# Patient Record
Sex: Female | Born: 1970
Health system: Southern US, Community
[De-identification: ages and names within clinical notes are randomized; demographics above are authoritative.]

## PROBLEM LIST (undated history)

## (undated) DIAGNOSIS — F32A Depression, unspecified: Secondary | ICD-10-CM

## (undated) DIAGNOSIS — G4730 Sleep apnea, unspecified: Secondary | ICD-10-CM

## (undated) DIAGNOSIS — D649 Anemia, unspecified: Secondary | ICD-10-CM

## (undated) DIAGNOSIS — N200 Calculus of kidney: Secondary | ICD-10-CM

## (undated) DIAGNOSIS — F329 Major depressive disorder, single episode, unspecified: Secondary | ICD-10-CM

## (undated) DIAGNOSIS — G5601 Carpal tunnel syndrome, right upper limb: Secondary | ICD-10-CM

## (undated) DIAGNOSIS — Z5189 Encounter for other specified aftercare: Secondary | ICD-10-CM

## (undated) DIAGNOSIS — E669 Obesity, unspecified: Secondary | ICD-10-CM

## (undated) DIAGNOSIS — F319 Bipolar disorder, unspecified: Secondary | ICD-10-CM

## (undated) DIAGNOSIS — J209 Acute bronchitis, unspecified: Secondary | ICD-10-CM

## (undated) DIAGNOSIS — K56609 Unspecified intestinal obstruction, unspecified as to partial versus complete obstruction: Secondary | ICD-10-CM

## (undated) DIAGNOSIS — F419 Anxiety disorder, unspecified: Secondary | ICD-10-CM

## (undated) DIAGNOSIS — E119 Type 2 diabetes mellitus without complications: Secondary | ICD-10-CM

## (undated) HISTORY — PX: ENDOMETRIAL ABLATION: SHX621

## (undated) HISTORY — DX: Obesity, unspecified: E66.9

## (undated) HISTORY — DX: Anemia, unspecified: D64.9

## (undated) HISTORY — DX: Type 2 diabetes mellitus without complications: E11.9

## (undated) HISTORY — DX: Acute bronchitis, unspecified: J20.9

## (undated) HISTORY — DX: Encounter for other specified aftercare: Z51.89

## (undated) HISTORY — DX: Major depressive disorder, single episode, unspecified: F32.9

## (undated) HISTORY — DX: Depression, unspecified: F32.A

---
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
## 1999-05-16 HISTORY — PX: GASTRIC BYPASS: SHX52

## 2013-05-15 HISTORY — PX: OTHER SURGICAL HISTORY: SHX169

## 2014-01-13 DIAGNOSIS — K56609 Unspecified intestinal obstruction, unspecified as to partial versus complete obstruction: Secondary | ICD-10-CM

## 2014-01-13 HISTORY — DX: Unspecified intestinal obstruction, unspecified as to partial versus complete obstruction: K56.609

## 2014-12-31 ENCOUNTER — Encounter (HOSPITAL_BASED_OUTPATIENT_CLINIC_OR_DEPARTMENT_OTHER): Payer: Self-pay | Admitting: Emergency Medicine

## 2014-12-31 ENCOUNTER — Emergency Department (HOSPITAL_BASED_OUTPATIENT_CLINIC_OR_DEPARTMENT_OTHER)
Admission: EM | Admit: 2014-12-31 | Discharge: 2014-12-31 | Disposition: A | Discharge status: Home / Self Care | Payer: Medicaid - Out of State | Attending: Emergency Medicine | Admitting: Emergency Medicine

## 2014-12-31 ENCOUNTER — Emergency Department (HOSPITAL_BASED_OUTPATIENT_CLINIC_OR_DEPARTMENT_OTHER): Payer: Medicaid - Out of State

## 2014-12-31 DIAGNOSIS — Z720 Tobacco use: Secondary | ICD-10-CM | POA: Insufficient documentation

## 2014-12-31 DIAGNOSIS — Z79899 Other long term (current) drug therapy: Secondary | ICD-10-CM | POA: Diagnosis not present

## 2014-12-31 DIAGNOSIS — J9801 Acute bronchospasm: Secondary | ICD-10-CM | POA: Insufficient documentation

## 2014-12-31 DIAGNOSIS — R0602 Shortness of breath: Secondary | ICD-10-CM | POA: Diagnosis present

## 2014-12-31 HISTORY — DX: Calculus of kidney: N20.0

## 2014-12-31 HISTORY — DX: Anxiety disorder, unspecified: F41.9

## 2014-12-31 HISTORY — DX: Bipolar disorder, unspecified: F31.9

## 2014-12-31 MED ORDER — ALBUTEROL (5 MG/ML) CONTINUOUS INHALATION SOLN
10.0000 mg/h | INHALATION_SOLUTION | Freq: Once | RESPIRATORY_TRACT | Status: AC
  Administered 2014-12-31: 10 mg/h via RESPIRATORY_TRACT
  Filled 2014-12-31: qty 20

## 2014-12-31 MED ORDER — ALBUTEROL SULFATE (2.5 MG/3ML) 0.083% IN NEBU
2.5000 mg | INHALATION_SOLUTION | Freq: Once | RESPIRATORY_TRACT | Status: AC
  Administered 2014-12-31: 2.5 mg via RESPIRATORY_TRACT
  Filled 2014-12-31: qty 3

## 2014-12-31 MED ORDER — PREDNISONE 20 MG PO TABS: 20.0000 mg | ORAL_TABLET | Freq: Two times a day (BID) | ORAL | Status: DC

## 2014-12-31 MED ORDER — BENZONATATE 100 MG PO CAPS: 100.0000 mg | ORAL_CAPSULE | Freq: Three times a day (TID) | ORAL | Status: DC

## 2014-12-31 MED ORDER — IPRATROPIUM-ALBUTEROL 0.5-2.5 (3) MG/3ML IN SOLN
3.0000 mL | Freq: Once | RESPIRATORY_TRACT | Status: AC
  Administered 2014-12-31: 3 mL via RESPIRATORY_TRACT
  Filled 2014-12-31: qty 3

## 2014-12-31 MED ORDER — PREDNISONE 50 MG PO TABS
60.0000 mg | ORAL_TABLET | Freq: Once | ORAL | Status: AC
  Administered 2014-12-31: 60 mg via ORAL
  Filled 2014-12-31 (×2): qty 1

## 2014-12-31 MED ORDER — BENZONATATE 100 MG PO CAPS
200.0000 mg | ORAL_CAPSULE | Freq: Once | ORAL | Status: AC
  Administered 2014-12-31: 200 mg via ORAL
  Filled 2014-12-31: qty 2

## 2014-12-31 MED ORDER — HYDROCOD POLST-CPM POLST ER 10-8 MG/5ML PO SUER: 5.0000 mL | Freq: Two times a day (BID) | ORAL | Status: DC | PRN

## 2014-12-31 MED ORDER — ALBUTEROL SULFATE HFA 108 (90 BASE) MCG/ACT IN AERS: 1.0000 | INHALATION_SPRAY | Freq: Four times a day (QID) | RESPIRATORY_TRACT | Status: DC | PRN

## 2014-12-31 NOTE — ED Notes (Signed)
Patient is resting comfortably. 

## 2014-12-31 NOTE — ED Provider Notes (Signed)
CSN: [id number]     Arrival date & time 12/31/14  4098 History   First MD Initiated Contact with Patient 12/31/14 0830     Chief Complaint  Patient presents with  . Shortness of Breath      HPI  Patient presents for evaluation after difficult breathing. Felt like she had a "allergy" yesterday. States her eyes were watering her nose was "pouring snot". She felt better last night after some Benadryl. This morning she felt short of breath and tight and wheezing. She is very distant history of bronchospasm is not using inhalers for many years. No fevers no chills. No chest pain. No lower extremity edema.  Past Medical History  Diagnosis Date  . Kidney stone   . Anxiety   . Bipolar affective disorder    Past Surgical History  Procedure Laterality Date  . Cesarean section    . Gastric bypass    . Uterine ablasion     No family history on file. Social History  Substance Use Topics  . Smoking status: Current Some Day Smoker  . Smokeless tobacco: None  . Alcohol Use: None   OB History    No data available     Review of Systems  Constitutional: Negative for fever, chills, diaphoresis, appetite change and fatigue.  HENT: Positive for rhinorrhea. Negative for mouth sores, sore throat and trouble swallowing.   Eyes: Negative for visual disturbance.  Respiratory: Positive for cough and wheezing. Negative for chest tightness and shortness of breath.   Cardiovascular: Negative for chest pain.  Gastrointestinal: Negative for nausea, vomiting, abdominal pain, diarrhea and abdominal distention.  Endocrine: Negative for polydipsia, polyphagia and polyuria.  Genitourinary: Negative for dysuria, frequency and hematuria.  Musculoskeletal: Negative for gait problem.  Skin: Negative for color change, pallor and rash.  Neurological: Negative for dizziness, syncope, light-headedness and headaches.  Hematological: Does not bruise/bleed easily.  Psychiatric/Behavioral: Negative for behavioral  problems and confusion.      Allergies  Nsaids  Home Medications   Prior to Admission medications   Medication Sig Start Date End Date Taking? Authorizing Provider  amitriptyline (ELAVIL) 25 MG tablet Take 25 mg by mouth 3 (three) times daily.   Yes Historical Provider, MD  diazepam (VALIUM) 10 MG tablet Take 10 mg by mouth 3 (three) times daily.   Yes Historical Provider, MD  DOXAZOSIN MESYLATE ER PO Take 1 mg by mouth daily.   Yes Historical Provider, MD  FLUoxetine (PROZAC) 20 MG tablet Take 60 mg by mouth daily.   Yes Historical Provider, MD  Oxcarbazepine (TRILEPTAL) 300 MG tablet Take 300 mg by mouth 3 (three) times daily.   Yes Historical Provider, MD  QUEtiapine (SEROQUEL) 200 MG tablet Take 200 mg by mouth at bedtime.   Yes Historical Provider, MD  trifluoperazine (STELAZINE) 2 MG tablet Take 2 mg by mouth 2 (two) times daily.   Yes Historical Provider, MD  valACYclovir (VALTREX) 1000 MG tablet Take 1,000 mg by mouth daily.   Yes Historical Provider, MD  vorinostat (ZOLINZA) 100 MG capsule Take 400 mg by mouth daily. Take with food.   Yes Historical Provider, MD  zolpidem (AMBIEN) 10 MG tablet Take 10 mg by mouth at bedtime as needed for sleep.   Yes Historical Provider, MD  albuterol (PROVENTIL HFA;VENTOLIN HFA) 108 (90 BASE) MCG/ACT inhaler Inhale 1-2 puffs into the lungs every 6 (six) hours as needed for wheezing. 12/31/14   Rolland Porter, MD  benzonatate (TESSALON) 100 MG capsule Take 1 capsule (100  mg total) by mouth every 8 (eight) hours. 12/31/14   Rolland Porter, MD  chlorpheniramine-HYDROcodone [facility] ER) 10-8 MG/5ML SUER Take 5 mLs by mouth every 12 (twelve) hours as needed for cough. 12/31/14   Rolland Porter, MD  predniSONE (DELTASONE) 20 MG tablet Take 1 tablet (20 mg total) by mouth 2 (two) times daily with a meal. 12/31/14   Rolland Porter, MD   BP 120/70 mmHg  Pulse 98  Temp(Src) 98.3 F (36.8 C)  Resp 16  Ht 5\' 4"  (1.626 m)  Wt 200 lb (90.719 kg)  BMI 34.31  kg/m2  SpO2 95% Physical Exam  Constitutional: She is oriented to person, place, and time. She appears well-developed and well-nourished. No distress.  HENT:  Head: Normocephalic.  Eyes: Conjunctivae are normal. Pupils are equal, round, and reactive to light. No scleral icterus.  Neck: Normal range of motion. Neck supple. No thyromegaly present.  Cardiovascular: Normal rate and regular rhythm.  Exam reveals no gallop and no friction rub.   No murmur heard. Pulmonary/Chest: Effort normal. No respiratory distress. She has wheezes. She has no rales.  Wheezing and prolongation in all fields. Overall good air exchange. No increased work of breathing. Does not appear dyspneic or distressed.  Abdominal: Soft. Bowel sounds are normal. She exhibits no distension. There is no tenderness. There is no rebound.  Musculoskeletal: Normal range of motion.  Neurological: She is alert and oriented to person, place, and time.  Skin: Skin is warm and dry. No rash noted.  Psychiatric: She has a normal mood and affect. Her behavior is normal.    ED Course  Procedures (including critical care time) Labs Review Labs Reviewed - No data to display  Imaging Review Dg Chest 2 View  12/31/2014   CLINICAL DATA:  Shortness of breath today. Sinus symptoms 12/30/2014. Initial encounter.  EXAM: CHEST  2 VIEW  COMPARISON:  None.  FINDINGS: Heart size and mediastinal contours are within normal limits. Both lungs are clear. Visualized skeletal structures are unremarkable.  IMPRESSION: Negative exam.   Electronically Signed   By: Drusilla Kanner M.D.   On: 12/31/2014 09:21   I have personally reviewed and evaluated these images and lab results as part of my medical decision-making.   EKG Interpretation None      MDM   Final diagnoses:  Bronchospasm    Given by mouth prednisone. Given nebulized albuterol. Minimal improvement. Given 1 hour continuous nebulized albuterol. Has much improvement. He is sleeping. Well  oxygenated at 96%. X-ray without acute findings. No fever. Not hypoxemic. Plan is discharge home. Zyrtec, prednisone, albuterol MDI. Return worsening revolving symptoms.  CRITICAL CARE Performed by: Rolland Porter JOSEPH   Total critical care time: 60 minutes of continuous nebulized albuterol  Critical care time was exclusive of separately billable procedures and treating other patients.  Critical care was necessary to treat or prevent imminent or life-threatening deterioration.  Critical care was time spent personally by me on the following activities: development of treatment plan with patient and/or surrogate as well as nursing, discussions with consultants, evaluation of patient's response to treatment, examination of patient, obtaining history from patient or surrogate, ordering and performing treatments and interventions, ordering and review of laboratory studies, ordering and review of radiographic studies, pulse oximetry and re-evaluation of patient's condition. care    Rolland Porter, MD 12/31/14 1256

## 2014-12-31 NOTE — ED Notes (Signed)
Some allergy symptoms yesterday which has progressed to sob.  Some coughing.  Non productive.

## 2014-12-31 NOTE — Discharge Instructions (Signed)
Bronchospasm °A bronchospasm is a spasm or tightening of the airways going into the lungs. During a bronchospasm breathing becomes more difficult because the airways get smaller. When this happens there can be coughing, a whistling sound when breathing (wheezing), and difficulty breathing. Bronchospasm is often associated with asthma, but not all patients who experience a bronchospasm have asthma. °CAUSES  °A bronchospasm is caused by inflammation or irritation of the airways. The inflammation or irritation may be triggered by:  °· Allergies (such as to animals, pollen, food, or mold). Allergens that cause bronchospasm may cause wheezing immediately after exposure or many hours later.   °· Infection. Viral infections are believed to be the most common cause of bronchospasm.   °· Exercise.   °· Irritants (such as pollution, cigarette smoke, strong odors, aerosol sprays, and paint fumes).   °· Weather changes. Winds increase molds and pollens in the air. Rain refreshes the air by washing irritants out. Cold air may cause inflammation.   °· Stress and emotional upset.   °SIGNS AND SYMPTOMS  °· Wheezing.   °· Excessive nighttime coughing.   °· Frequent or severe coughing with a simple cold.   °· Chest tightness.   °· Shortness of breath.   °DIAGNOSIS  °Bronchospasm is usually diagnosed through a history and physical exam. Tests, such as chest X-rays, are sometimes done to look for other conditions. °TREATMENT  °· Inhaled medicines can be given to open up your airways and help you breathe. The medicines can be given using either an inhaler or a nebulizer machine. °· Corticosteroid medicines may be given for severe bronchospasm, usually when it is associated with asthma. °HOME CARE INSTRUCTIONS  °· Always have a plan prepared for seeking medical care. Know when to call your health care provider and local emergency services (911 in the [place] Know where you can access local emergency care. °· Only take medicines as  directed by your health care provider. °· If you were prescribed an inhaler or nebulizer machine, ask your health care provider to explain how to use it correctly. Always use a spacer with your inhaler if you were given one. °· It is necessary to remain calm during an attack. Try to relax and breathe more slowly.  °· Control your home environment in the following ways:   °¨ Change your heating and air conditioning filter at least once a month.   °¨ Limit your use of fireplaces and wood stoves. °¨ Do not smoke and do not allow smoking in your home.   °¨ Avoid exposure to perfumes and fragrances.   °¨ Get rid of pests (such as roaches and mice) and their droppings.   °¨ Throw away plants if you see mold on them.   °¨ Keep your house clean and dust free.   °¨ Replace carpet with wood, tile, or vinyl flooring. Carpet can trap dander and dust.   °¨ Use allergy-proof pillows, mattress covers, and box spring covers.   °¨ Wash bed sheets and blankets every week in hot water and dry them in a dryer.   °¨ Use blankets that are made of polyester or cotton.   °¨ Wash hands frequently. °SEEK MEDICAL CARE IF:  °· You have muscle aches.   °· You have chest pain.   °· The sputum changes from clear or white to yellow, green, gray, or bloody.   °· The sputum you cough up gets thicker.   °· There are problems that may be related to the medicine you are given, such as a rash, itching, swelling, or trouble breathing.   °SEEK IMMEDIATE MEDICAL CARE IF:  °· You have worsening wheezing and coughing even   after taking your prescribed medicines.   °· You have increased difficulty breathing.   °· You develop severe chest pain. °MAKE SURE YOU:  °· Understand these instructions. °· Will watch your condition. °· Will get help right away if you are not doing well or get worse. °Document Released: 05/04/2003 Document Revised: 05/06/2013 Document Reviewed: 10/21/2012 °[employer]® Patient Information ©2015 [employer]. This information is not  intended to replace advice given to you by your health care provider. Make sure you discuss any questions you have with your health care provider. ° °

## 2015-01-01 ENCOUNTER — Inpatient Hospital Stay (HOSPITAL_BASED_OUTPATIENT_CLINIC_OR_DEPARTMENT_OTHER)
Admission: EM | Admit: 2015-01-01 | Discharge: 2015-01-05 | DRG: 202 | Disposition: A | Discharge status: Home / Self Care | Payer: Medicaid - Out of State | Attending: Internal Medicine | Admitting: Internal Medicine

## 2015-01-01 ENCOUNTER — Emergency Department (HOSPITAL_BASED_OUTPATIENT_CLINIC_OR_DEPARTMENT_OTHER): Payer: Medicaid - Out of State

## 2015-01-01 ENCOUNTER — Encounter (HOSPITAL_BASED_OUTPATIENT_CLINIC_OR_DEPARTMENT_OTHER): Payer: Self-pay

## 2015-01-01 DIAGNOSIS — I10 Essential (primary) hypertension: Secondary | ICD-10-CM | POA: Diagnosis present

## 2015-01-01 DIAGNOSIS — J209 Acute bronchitis, unspecified: Secondary | ICD-10-CM | POA: Diagnosis not present

## 2015-01-01 DIAGNOSIS — F411 Generalized anxiety disorder: Secondary | ICD-10-CM | POA: Diagnosis present

## 2015-01-01 DIAGNOSIS — F319 Bipolar disorder, unspecified: Secondary | ICD-10-CM | POA: Diagnosis not present

## 2015-01-01 DIAGNOSIS — Z9884 Bariatric surgery status: Secondary | ICD-10-CM

## 2015-01-01 DIAGNOSIS — Z79899 Other long term (current) drug therapy: Secondary | ICD-10-CM

## 2015-01-01 DIAGNOSIS — J45909 Unspecified asthma, uncomplicated: Secondary | ICD-10-CM | POA: Diagnosis present

## 2015-01-01 DIAGNOSIS — J9801 Acute bronchospasm: Secondary | ICD-10-CM | POA: Insufficient documentation

## 2015-01-01 DIAGNOSIS — Z87442 Personal history of urinary calculi: Secondary | ICD-10-CM

## 2015-01-01 DIAGNOSIS — Z7952 Long term (current) use of systemic steroids: Secondary | ICD-10-CM

## 2015-01-01 DIAGNOSIS — Z6841 Body Mass Index (BMI) 40.0 and over, adult: Secondary | ICD-10-CM

## 2015-01-01 DIAGNOSIS — F17210 Nicotine dependence, cigarettes, uncomplicated: Secondary | ICD-10-CM | POA: Diagnosis present

## 2015-01-01 HISTORY — DX: Unspecified intestinal obstruction, unspecified as to partial versus complete obstruction: K56.609

## 2015-01-01 LAB — CBC
HEMATOCRIT: 40.4 % (ref 36.0–46.0)
HEMOGLOBIN: 13.9 g/dL (ref 12.0–15.0)
MCH: 32.3 pg (ref 26.0–34.0)
MCHC: 34.4 g/dL (ref 30.0–36.0)
MCV: 93.7 fL (ref 78.0–100.0)
Platelets: 255 10*3/uL (ref 150–400)
RBC: 4.31 MIL/uL (ref 3.87–5.11)
RDW: 13 % (ref 11.5–15.5)
WBC: 13.6 10*3/uL — AB (ref 4.0–10.5)

## 2015-01-01 LAB — BASIC METABOLIC PANEL
ANION GAP: 10 (ref 5–15)
BUN: 7 mg/dL (ref 6–20)
CO2: 24 mmol/L (ref 22–32)
Calcium: 8.8 mg/dL — ABNORMAL LOW (ref 8.9–10.3)
Chloride: 104 mmol/L (ref 101–111)
Creatinine, Ser: 0.6 mg/dL (ref 0.44–1.00)
GFR calc Af Amer: >60 mL/min (ref >60)
GLUCOSE: 165 mg/dL — AB (ref 65–99)
POTASSIUM: 4.2 mmol/L (ref 3.5–5.1)
SODIUM: 138 mmol/L (ref 135–145)

## 2015-01-01 LAB — BRAIN NATRIURETIC PEPTIDE: B NATRIURETIC PEPTIDE 5: 157.3 pg/mL — AB (ref 0.0–100.0)

## 2015-01-01 LAB — CBC WITH DIFFERENTIAL/PLATELET
BASOS ABS: 0 10*3/uL (ref 0.0–0.1)
Basophils Relative: 0 % (ref 0–1)
EOS PCT: 0 % (ref 0–5)
Eosinophils Absolute: 0 10*3/uL (ref 0.0–0.7)
HCT: 40.8 % (ref 36.0–46.0)
HEMOGLOBIN: 13.9 g/dL (ref 12.0–15.0)
LYMPHS PCT: 7 % — AB (ref 12–46)
Lymphs Abs: 1 10*3/uL (ref 0.7–4.0)
MCH: 32.3 pg (ref 26.0–34.0)
MCHC: 34.1 g/dL (ref 30.0–36.0)
MCV: 94.7 fL (ref 78.0–100.0)
Monocytes Absolute: 0.8 10*3/uL (ref 0.1–1.0)
Monocytes Relative: 5 % (ref 3–12)
NEUTROS PCT: 88 % — AB (ref 43–77)
Neutro Abs: 12.4 10*3/uL — ABNORMAL HIGH (ref 1.7–7.7)
PLATELETS: 258 10*3/uL (ref 150–400)
RBC: 4.31 MIL/uL (ref 3.87–5.11)
RDW: 12.4 % (ref 11.5–15.5)
WBC: 14.2 10*3/uL — AB (ref 4.0–10.5)

## 2015-01-01 LAB — TROPONIN I: Troponin I: <0.03 ng/mL (ref <0.031)

## 2015-01-01 LAB — CREATININE, SERUM
Creatinine, Ser: 0.76 mg/dL (ref 0.44–1.00)
GFR calc non Af Amer: >60 mL/min (ref >60)

## 2015-01-01 MED ORDER — ALBUTEROL (5 MG/ML) CONTINUOUS INHALATION SOLN
INHALATION_SOLUTION | RESPIRATORY_TRACT | Status: AC
  Administered 2015-01-01: 13:00:00
  Filled 2015-01-01: qty 20

## 2015-01-01 MED ORDER — DOXAZOSIN MESYLATE 1 MG PO TABS
1.0000 mg | ORAL_TABLET | Freq: Every day | ORAL | Status: DC
  Administered 2015-01-02 – 2015-01-05 (×4): 1 mg via ORAL
  Filled 2015-01-01 (×4): qty 1

## 2015-01-01 MED ORDER — METHYLPREDNISOLONE SODIUM SUCC 125 MG IJ SOLR
125.0000 mg | Freq: Once | INTRAMUSCULAR | Status: AC
  Administered 2015-01-01: 125 mg via INTRAVENOUS
  Filled 2015-01-01: qty 2

## 2015-01-01 MED ORDER — ACETAMINOPHEN 325 MG PO TABS
650.0000 mg | ORAL_TABLET | Freq: Four times a day (QID) | ORAL | Status: DC | PRN
  Administered 2015-01-01 – 2015-01-02 (×2): 650 mg via ORAL
  Filled 2015-01-01 (×2): qty 2

## 2015-01-01 MED ORDER — TRIFLUOPERAZINE HCL 2 MG PO TABS
2.0000 mg | ORAL_TABLET | Freq: Two times a day (BID) | ORAL | Status: DC
  Administered 2015-01-01 – 2015-01-05 (×8): 2 mg via ORAL
  Filled 2015-01-01 (×9): qty 1

## 2015-01-01 MED ORDER — ALBUTEROL SULFATE (2.5 MG/3ML) 0.083% IN NEBU
INHALATION_SOLUTION | RESPIRATORY_TRACT | Status: AC
  Administered 2015-01-01: 2.5 mg via RESPIRATORY_TRACT
  Filled 2015-01-01: qty 3

## 2015-01-01 MED ORDER — ALBUTEROL SULFATE (2.5 MG/3ML) 0.083% IN NEBU: 2.5000 mg | INHALATION_SOLUTION | RESPIRATORY_TRACT | Status: DC | PRN

## 2015-01-01 MED ORDER — DIAZEPAM 5 MG PO TABS
10.0000 mg | ORAL_TABLET | Freq: Three times a day (TID) | ORAL | Status: DC
  Administered 2015-01-01 – 2015-01-05 (×11): 10 mg via ORAL
  Filled 2015-01-01 (×11): qty 2

## 2015-01-01 MED ORDER — ALBUTEROL SULFATE (2.5 MG/3ML) 0.083% IN NEBU
2.5000 mg | INHALATION_SOLUTION | Freq: Four times a day (QID) | RESPIRATORY_TRACT | Status: DC
  Administered 2015-01-01: 2.5 mg via RESPIRATORY_TRACT
  Filled 2015-01-01: qty 3

## 2015-01-01 MED ORDER — VALACYCLOVIR HCL 500 MG PO TABS
1000.0000 mg | ORAL_TABLET | Freq: Every day | ORAL | Status: DC
  Administered 2015-01-02 – 2015-01-05 (×4): 1000 mg via ORAL
  Filled 2015-01-01 (×4): qty 2

## 2015-01-01 MED ORDER — OXCARBAZEPINE 300 MG PO TABS
300.0000 mg | ORAL_TABLET | Freq: Three times a day (TID) | ORAL | Status: DC
  Administered 2015-01-01 – 2015-01-05 (×11): 300 mg via ORAL
  Filled 2015-01-01 (×13): qty 1

## 2015-01-01 MED ORDER — ONDANSETRON HCL 4 MG/2ML IJ SOLN: 4.0000 mg | Freq: Four times a day (QID) | INTRAMUSCULAR | Status: DC | PRN

## 2015-01-01 MED ORDER — ENOXAPARIN SODIUM 40 MG/0.4ML ~~LOC~~ SOLN
40.0000 mg | SUBCUTANEOUS | Status: DC
  Administered 2015-01-01 – 2015-01-04 (×4): 40 mg via SUBCUTANEOUS
  Filled 2015-01-01 (×4): qty 0.4

## 2015-01-01 MED ORDER — DOCUSATE SODIUM 100 MG PO CAPS
100.0000 mg | ORAL_CAPSULE | Freq: Two times a day (BID) | ORAL | Status: DC
  Administered 2015-01-01 – 2015-01-05 (×8): 100 mg via ORAL
  Filled 2015-01-01 (×8): qty 1

## 2015-01-01 MED ORDER — ZOLPIDEM TARTRATE 5 MG PO TABS
5.0000 mg | ORAL_TABLET | Freq: Every day | ORAL | Status: DC
  Administered 2015-01-01 – 2015-01-04 (×4): 5 mg via ORAL
  Filled 2015-01-01 (×4): qty 1

## 2015-01-01 MED ORDER — ALUM & MAG HYDROXIDE-SIMETH 200-200-20 MG/5ML PO SUSP: 30.0000 mL | Freq: Four times a day (QID) | ORAL | Status: DC | PRN

## 2015-01-01 MED ORDER — FLUOXETINE HCL 20 MG PO TABS
60.0000 mg | ORAL_TABLET | Freq: Every day | ORAL | Status: DC
  Administered 2015-01-02 – 2015-01-05 (×4): 60 mg via ORAL
  Filled 2015-01-01 (×9): qty 3

## 2015-01-01 MED ORDER — ALBUTEROL SULFATE HFA 108 (90 BASE) MCG/ACT IN AERS: 1.0000 | INHALATION_SPRAY | Freq: Four times a day (QID) | RESPIRATORY_TRACT | Status: DC | PRN

## 2015-01-01 MED ORDER — AMITRIPTYLINE HCL 50 MG PO TABS
25.0000 mg | ORAL_TABLET | Freq: Three times a day (TID) | ORAL | Status: DC
  Administered 2015-01-01 – 2015-01-05 (×11): 25 mg via ORAL
  Filled 2015-01-01 (×12): qty 1

## 2015-01-01 MED ORDER — BENZONATATE 100 MG PO CAPS
100.0000 mg | ORAL_CAPSULE | Freq: Three times a day (TID) | ORAL | Status: DC
  Administered 2015-01-01 – 2015-01-05 (×11): 100 mg via ORAL
  Filled 2015-01-01 (×12): qty 1

## 2015-01-01 MED ORDER — ONDANSETRON HCL 4 MG PO TABS: 4.0000 mg | ORAL_TABLET | Freq: Four times a day (QID) | ORAL | Status: DC | PRN

## 2015-01-01 MED ORDER — ACETAMINOPHEN 650 MG RE SUPP: 650.0000 mg | Freq: Four times a day (QID) | RECTAL | Status: DC | PRN

## 2015-01-01 MED ORDER — QUETIAPINE FUMARATE 25 MG PO TABS
200.0000 mg | ORAL_TABLET | Freq: Every day | ORAL | Status: DC
  Administered 2015-01-01 – 2015-01-04 (×4): 200 mg via ORAL
  Filled 2015-01-01 (×4): qty 8

## 2015-01-01 MED ORDER — IPRATROPIUM-ALBUTEROL 0.5-2.5 (3) MG/3ML IN SOLN
RESPIRATORY_TRACT | Status: AC
  Administered 2015-01-01: 3 mL
  Filled 2015-01-01: qty 3

## 2015-01-01 MED ORDER — ONDANSETRON HCL 4 MG/2ML IJ SOLN
4.0000 mg | Freq: Once | INTRAMUSCULAR | Status: AC
  Administered 2015-01-01: 4 mg via INTRAVENOUS
  Filled 2015-01-01: qty 2

## 2015-01-01 NOTE — Progress Notes (Signed)
NURSING PROGRESS NOTE  Ashley Franklin [id number] Admission Data: 01/01/2015 7:33 PM Attending Provider: Penny Pia, MD PCP:No PCP Per Patient Code Status: Full  Ashley Franklin is a 44 y.o. female patient admitted from ED:  -No acute distress noted.  -No complaints of shortness of breath.  -No complaints of chest pain.   Blood pressure 110/59, pulse 98, temperature 98.4 F (36.9 C), temperature source Oral, resp. rate 20, height  (1.626 m), weight 125.193 kg (276 lb), SpO2 94 %.   IV Fluids:  IV in place, occlusive dsg intact without redness, IV cath forearm right, condition patent and no redness none.   Allergies:  Nsaids  Past Medical History:   has a past medical history of Kidney stone; Anxiety; Bipolar affective disorder; and SBO (small bowel obstruction).  Past Surgical History:   has past surgical history that includes Cesarean section; Gastric bypass; uterine ablasion; and Tubal ligation.  Social History:   reports that she has been smoking.  She does not have any smokeless tobacco history on file. She reports that she drinks alcohol. She reports that she does not use illicit drugs.  Skin: Intact  Patient/Family orientated to room. Information packet given to patient/family. Admission inpatient armband information verified with patient/family to include name and date of birth and placed on patient arm. Side rails up x 2, fall assessment and education completed with patient/family. Patient/family able to verbalize understanding of risk associated with falls and verbalized understanding to call for assistance before getting out of bed. Call light within reach. Patient/family able to voice and demonstrate understanding of unit orientation instructions.    Will continue to evaluate and treat per MD orders.

## 2015-01-01 NOTE — H&P (Signed)
[facility] History and Physical  DAMYRA LUSCHER RUE:[id number] DOB: 07/21/1970 DOA: 01/01/2015  Referring physician: ED physician PCP: No PCP Per Patient   Chief Complaint: SOB and cough  HPI:  Ashley Franklin is a 44yo woman with PMH of asthma in her past, bipolar affective disorder, anxiety who presents to the hospital with persistent SOB and cough.  Ashley Franklin reports that she believes this all started last Christmas when she was diagnosed with pneumonia.  She completed treatment for the pneumonia but never felt that her breathing "got back to normal."  Since that time she has had more SOB and wheezing.  On Wednesday of this week, she had what she thought was a severe allergy attack with watery eyes, runny nose, SOB, wheezing, etc.  The watery eyes and rhinorrhea resolved, but a "tremendous" amount of SOB remained associated with straining in her chest, difficulty getting air out, pain with deep inhalation over back rib cage, paroxysms of causing which cause occasional incontinence.  Further symptoms include movement making the coughing worse and difficulty lying flat.  She has no substernal chest pain, fever, chills, nausea vomiting.  She has not produced sputum.  She feels so bad that she has been eating less.  She is a current smoker.  She does not use inhalers and was diagnosed with asthma as a child.  She takes quite a few medications for her mental health issues.    She was initially seen in the ED on 8/18, where she was given prednisone, albuterol including 1 hour continuous and no hypoxemia.  She was sent home with prednisone, zyrtec and albuterol.  She reports these medications did not help and she returned to the ED on 8/19.  In the ED, she received continuous nebs again and solumedrol.  She did start to have desaturations and these persisted with coughing.  CXR on 8/19 showed possible bronchitis.   Assessment and Plan:  Bronchospasm with bronchitis, acute  - CXR supportive of this  finding, anxiety may be playing a large part in her symptoms.  - She was given solumedrol in the ED, will repeat one dose in the morning - Nebs q6 hours and q2hours prn - Oxygen therapy to keep O2 sats > 92% - Given paroxysm of cough she describes, will start azithromycin for possible pertussis - She has an elevated WBC, however, she has been on steroids for 2 days now.  Given no consolidation on CXR I do not think she needs CAP coverage.    - Tessalon perles for cough - Pulse ox with vitals - IS to bedside  Bipolar affective disorder - Continue home meds including quetiapine, trileptal, stelazine, doxazosin    Anxiety state - Continue home meds amitriptyline, valium  Code: Full  DVT PPx: Lovenox  Diet: regular   Radiological Exams on Admission: Dg Chest 2 View  01/01/2015   CLINICAL DATA:  Cough and shortness of breath for 2 days.  EXAM: CHEST  2 VIEW  COMPARISON:  12/31/2014.  FINDINGS: The cardiac silhouette, mediastinal and hilar contours are within normal limits and stable. There are bronchitic lung changes with peribronchial thickening and increased interstitial markings. Findings suggest bronchitis or interstitial pneumonitis. No airspace consolidation or pleural effusion. The bony thorax is intact.  IMPRESSION: Findings suggest bronchitis.  No focal infiltrates.   Electronically Signed   By: Rudie Meyer M.D.   On: 01/01/2015 14:48   Dg Chest 2 View  12/31/2014   CLINICAL DATA:  Shortness of breath today. Sinus  symptoms 12/30/2014. Initial encounter.  EXAM: CHEST  2 VIEW  COMPARISON:  None.  FINDINGS: Heart size and mediastinal contours are within normal limits. Both lungs are clear. Visualized skeletal structures are unremarkable.  IMPRESSION: Negative exam.   Electronically Signed   By: Drusilla Kanner M.D.   On: 12/31/2014 09:21   Code Status: Full Family Communication: Pt at bedside Disposition Plan: Admit for further evaluation    Debe Coder,  MD [phone]   Review of Systems:  Constitutional: Negative for fever, chills and malaise/fatigue. Negative for diaphoresis.  HENT: Negative for hearing loss, ear pain Eyes: Negative for blurred vision, double vision Respiratory: + for cough, SOB, wheezing. Negative for hemoptysis, sputum production and stridor.   Cardiovascular: Negative for chest pain, palpitations and leg swelling.  Gastrointestinal: Negative for nausea, vomiting and abdominal pain Genitourinary: Negative for dysuria, urgency Musculoskeletal: + for back pain with taking deep breaths Negative for myalgias, joint pain and falls.  Skin: Negative for itching and rash.  Neurological: Negative for dizziness and weakness.  Endo/Heme/Allergies: + for environmental allergies Negative for polydipsia. Does not bruise/bleed easily.  Psychiatric/Behavioral: + for h/o anxiety Negative for suicidal ideas.     Past Medical History  Diagnosis Date  . Kidney stone   . Anxiety   . Bipolar affective disorder   . SBO (small bowel obstruction)     Past Surgical History  Procedure Laterality Date  . Cesarean section    . Gastric bypass    . Uterine ablasion    . Tubal ligation      Social History:  reports that she has been smoking.  She does not have any smokeless tobacco history on file. She reports that she drinks alcohol. She reports that she does not use illicit drugs. This was confirmed with the patient.   Allergies  Allergen Reactions  . Nsaids Other (See Comments)    Contraindicated with gastric bypass    No family history on file.  Prior to Admission medications   Medication Sig Start Date End Date Taking? Authorizing Provider  amitriptyline (ELAVIL) 25 MG tablet Take 25 mg by mouth 3 (three) times daily.   Yes Historical Provider, MD  benzonatate (TESSALON) 100 MG capsule Take 1 capsule (100 mg total) by mouth every 8 (eight) hours. 12/31/14  Yes Rolland Porter, MD  chlorpheniramine-HYDROcodone [facility]  ER) 10-8 MG/5ML SUER Take 5 mLs by mouth every 12 (twelve) hours as needed for cough. 12/31/14  Yes Rolland Porter, MD  diazepam (VALIUM) 10 MG tablet Take 10 mg by mouth 3 (three) times daily.   Yes Historical Provider, MD  doxazosin (CARDURA) 1 MG tablet Take 1 mg by mouth daily. 12/30/14  Yes Historical Provider, MD  FLUoxetine (PROZAC) 20 MG tablet Take 60 mg by mouth daily.    Yes Historical Provider, MD  ibuprofen (ADVIL,MOTRIN) 800 MG tablet Take 800 mg by mouth every 8 (eight) hours as needed. for pain 10/15/14  Yes Historical Provider, MD  Oxcarbazepine (TRILEPTAL) 300 MG tablet Take 300 mg by mouth 3 (three) times daily.   Yes Historical Provider, MD  predniSONE (DELTASONE) 20 MG tablet Take 1 tablet (20 mg total) by mouth 2 (two) times daily with a meal. 12/31/14  Yes Rolland Porter, MD  QUEtiapine (SEROQUEL) 200 MG tablet Take 200 mg by mouth at bedtime.   Yes Historical Provider, MD  trifluoperazine (STELAZINE) 2 MG tablet Take 2 mg by mouth 2 (two) times daily.   Yes Historical Provider, MD  valACYclovir (VALTREX)  1000 MG tablet Take 1,000 mg by mouth daily.   Yes Historical Provider, MD  zolpidem (AMBIEN) 10 MG tablet Take 10 mg by mouth at bedtime.    Yes Historical Provider, MD           Physical Exam: Filed Vitals:   01/01/15 1700 01/01/15 1701 01/01/15 1804 01/01/15 2034  BP:  126/79 110/59 130/72  Pulse: 99  98 90  Temp:   98.4 F (36.9 C) 98.3 F (36.8 C)  TempSrc:   Oral Oral  Resp: Height:    (1.626 m)   Weight:   276 lb (125.193 kg)   SpO2: 94%  94% 95%    Physical Exam  Constitutional: Obese, somewhat distressed but speaking in full sentences easily.   HENT: Normocephalic. Oropharynx is clear and moist.  Eyes: Conjunctivae are normal. no scleral icterus.  Neck: Normal ROM. Neck supple.  CVS: RR, NR, S1/S2 +, no murmurs, no gallops Pulmonary: Abnormal effort, not able to take deep breaths.  Very tight sounding but no wheezing.  No rhonchi or rales.   Abdominal: Soft. BS +,  no distension Musculoskeletal: No edema and no tenderness.  Neuro: Alert. Normal gait, normal muscle tone. Skin: Skin is warm and dry. No pallor Psychiatric: Normal mood and affect.   Labs on Admission:  Basic Metabolic Panel:  Recent Labs Lab 01/01/15 1320 01/01/15 1955  NA 138  --   K 4.2  --   CL 104  --   CO2 24  --   GLUCOSE 165*  --   BUN 7  --   CREATININE 0.60 0.76  CALCIUM 8.8*  --    CBC:  Recent Labs Lab 01/01/15 1320 01/01/15 1955  WBC 14.2* 13.6*  NEUTROABS 12.4*  --   HGB 13.9 13.9  HCT 40.8 40.4  MCV 94.7 93.7  PLT 258 255   Cardiac Enzymes:  Recent Labs Lab 01/01/15 1320  TROPONINI <0.03    EKG: Normal sinus rhythm, wavering baseline, but not in all leads.  Possibly due to coughing.    If 7PM-7AM, please contact night-coverage [email or web address] Password [email or web address] 01/02/2015, 2:09 AM

## 2015-01-01 NOTE — ED Notes (Signed)
SOB and prod cough since Wednesday-seen here for same yesterday-feels worse

## 2015-01-01 NOTE — ED Notes (Signed)
Pt care transferred to [facility] at bedside. Pt and family aware of pt pending transport and inpatient admission to [facility].

## 2015-01-01 NOTE — ED Notes (Signed)
  CAT stopped and transported to x-ray.

## 2015-01-01 NOTE — ED Notes (Signed)
Pt and family aware of pending inpatient admission.

## 2015-01-01 NOTE — ED Notes (Signed)
Attempted to called report to [facility], was left on hold for 10 minutes, hung up and called back, was told RN was in an emergency and was not able to take report. Charge nurse to call me back and get report on patient.

## 2015-01-01 NOTE — ED Notes (Signed)
Report given to Victorino Dike, RN on [facility]

## 2015-01-01 NOTE — ED Provider Notes (Signed)
CSN: [id number]     Arrival date & time 01/01/15  1259 History   First MD Initiated Contact with Patient 01/01/15 1312     Chief Complaint  Patient presents with  . Shortness of Breath    HPI  Patient presents evaluation of difficulty breathing. Seen and evaluated by myself 24 hours ago. Had a pneumonia in December. Thought she never completely recovered and had occasional episodes of dyspnea. Does not have frank asthma or use inhalers at home. Reported sudden onset of symptoms 24 hours before her evaluation yesterday. Felt like she developed some nasal discharge and a sudden cough and tightness and shortness of breath. Given nebulized a beat or yesterday. She improved markedly. Was able toward here not hypoxemic. Able to be discharged home with prednisone, albuterol, cough medications. Presents today stating she did obtain her medications took a dose or prednisone last night. This taken 5 or 6 doses which is an excessive amount of her Tussionex. Has a fine resting tremor. However she is more short of breath wheezing and dyspneic and presents here.    Past Medical History  Diagnosis Date  . Kidney stone   . Anxiety   . Bipolar affective disorder   . SBO (small bowel obstruction)    Past Surgical History  Procedure Laterality Date  . Cesarean section    . Gastric bypass    . Uterine ablasion    . Tubal ligation     No family history on file. Social History  Substance Use Topics  . Smoking status: Current Some Day Smoker  . Smokeless tobacco: None  . Alcohol Use: Yes   OB History    No data available     Review of Systems  Constitutional: Negative for fever, chills, diaphoresis, appetite change and fatigue.  HENT: Positive for postnasal drip and rhinorrhea. Negative for mouth sores, sore throat and trouble swallowing.   Eyes: Negative for visual disturbance.  Respiratory: Positive for chest tightness, shortness of breath and wheezing. Negative for cough.   Cardiovascular:  Negative for chest pain.  Gastrointestinal: Negative for nausea, vomiting, abdominal pain, diarrhea and abdominal distention.  Endocrine: Negative for polydipsia, polyphagia and polyuria.  Genitourinary: Negative for dysuria, frequency and hematuria.  Musculoskeletal: Negative for gait problem.  Skin: Negative for color change, pallor and rash.  Neurological: Positive for tremors. Negative for dizziness, syncope, light-headedness and headaches.  Hematological: Does not bruise/bleed easily.  Psychiatric/Behavioral: Negative for behavioral problems and confusion.      Allergies  Nsaids  Home Medications   Prior to Admission medications   Medication Sig Start Date End Date Taking? Authorizing Provider  albuterol (PROVENTIL HFA;VENTOLIN HFA) 108 (90 BASE) MCG/ACT inhaler Inhale 1-2 puffs into the lungs every 6 (six) hours as needed for wheezing. 12/31/14   Rolland Porter, MD  amitriptyline (ELAVIL) 25 MG tablet Take 25 mg by mouth 3 (three) times daily.    Historical Provider, MD  benzonatate (TESSALON) 100 MG capsule Take 1 capsule (100 mg total) by mouth every 8 (eight) hours. 12/31/14   Rolland Porter, MD  chlorpheniramine-HYDROcodone [facility]) 10-8 MG/5ML SUER Take 5 mLs by mouth every 12 (twelve) hours as needed for cough. 12/31/14   Rolland Porter, MD  diazepam (VALIUM) 10 MG tablet Take 10 mg by mouth 3 (three) times daily.    Historical Provider, MD  DOXAZOSIN MESYLATE ER PO Take 1 mg by mouth daily.    Historical Provider, MD  FLUoxetine (PROZAC) 20 MG tablet Take 60 mg by  mouth daily.    Historical Provider, MD  Oxcarbazepine (TRILEPTAL) 300 MG tablet Take 300 mg by mouth 3 (three) times daily.    Historical Provider, MD  predniSONE (DELTASONE) 20 MG tablet Take 1 tablet (20 mg total) by mouth 2 (two) times daily with a meal. 12/31/14   Rolland Porter, MD  QUEtiapine (SEROQUEL) 200 MG tablet Take 200 mg by mouth at bedtime.    Historical Provider, MD  trifluoperazine (STELAZINE) 2 MG  tablet Take 2 mg by mouth 2 (two) times daily.    Historical Provider, MD  valACYclovir (VALTREX) 1000 MG tablet Take 1,000 mg by mouth daily.    Historical Provider, MD  vorinostat (ZOLINZA) 100 MG capsule Take 400 mg by mouth daily. Take with food.    Historical Provider, MD  zolpidem (AMBIEN) 10 MG tablet Take 10 mg by mouth at bedtime as needed for sleep.    Historical Provider, MD   BP 115/65 mmHg  Pulse 89  Temp(Src) 98.7 F (37.1 C) (Oral)  Resp 22  SpO2 99% Physical Exam  Constitutional: She is oriented to person, place, and time. She appears well-developed and well-nourished. No distress.  HENT:  Head: Normocephalic.  Eyes: Conjunctivae are normal. Pupils are equal, round, and reactive to light. No scleral icterus.  Neck: Normal range of motion. Neck supple. No thyromegaly present.  Cardiovascular: Normal rate and regular rhythm.  Exam reveals no gallop and no friction rub.   No murmur heard. Pulmonary/Chest: She is in respiratory distress. She has decreased breath sounds. She has wheezes. She has no rales.  Mentating well. Does not appear fatigued. Globally diminished breath sounds with wheezing and prolongation in all fields.  Abdominal: Soft. Bowel sounds are normal. She exhibits no distension. There is no tenderness. There is no rebound.  Musculoskeletal: Normal range of motion.  Neurological: She is alert and oriented to person, place, and time.  Skin: Skin is warm and dry. No rash noted.  Psychiatric: She has a normal mood and affect. Her behavior is normal.    ED Course  Procedures (including critical care time) Labs Review Labs Reviewed  CBC WITH DIFFERENTIAL/PLATELET - Abnormal; Notable for the following:    WBC 14.2 (*)    Neutrophils Relative % 88 (*)    Neutro Abs 12.4 (*)    Lymphocytes Relative 7 (*)    All other components within normal limits  BASIC METABOLIC PANEL - Abnormal; Notable for the following:    Glucose, Bld 165 (*)    Calcium 8.8 (*)     All other components within normal limits  BRAIN NATRIURETIC PEPTIDE - Abnormal; Notable for the following:    B Natriuretic Peptide 157.3 (*)    All other components within normal limits  TROPONIN I    Imaging Review Dg Chest 2 View  01/01/2015   CLINICAL DATA:  Cough and shortness of breath for 2 days.  EXAM: CHEST  2 VIEW  COMPARISON:  12/31/2014.  FINDINGS: The cardiac silhouette, mediastinal and hilar contours are within normal limits and stable. There are bronchitic lung changes with peribronchial thickening and increased interstitial markings. Findings suggest bronchitis or interstitial pneumonitis. No airspace consolidation or pleural effusion. The bony thorax is intact.  IMPRESSION: Findings suggest bronchitis.  No focal infiltrates.   Electronically Signed   By: Rudie Meyer M.D.   On: 01/01/2015 14:48   Dg Chest 2 View  12/31/2014   CLINICAL DATA:  Shortness of breath today. Sinus symptoms 12/30/2014. Initial encounter.  EXAM:  CHEST  2 VIEW  COMPARISON:  None.  FINDINGS: Heart size and mediastinal contours are within normal limits. Both lungs are clear. Visualized skeletal structures are unremarkable.  IMPRESSION: Negative exam.   Electronically Signed   By: Drusilla Kanner M.D.   On: 12/31/2014 09:21   I have personally reviewed and evaluated these images and lab results as part of my medical decision-making.   EKG Interpretation None      MDM   Final diagnoses:  Bronchospasm   CRITICAL CARE Performed by: Claudean Kinds   I discussed the case with [facility] hospitalist. Patient does not have hypoxemia. No signs of suggest congestive heart failure or infiltrate. Has a "bronchitis" pattern on chest x-ray. Not febrile. Does desaturate in between episodes today and continues bronchospasm after 90 minutes total continuous nebulized albuterol. Patient will require admission.  Total critical care time: 60 minutes Continuous nebulized albuterol over 1 hour. Multiple  re-evaluations during this treatment.  Critical care time was exclusive of separately billable procedures and treating other patients.  Critical care was necessary to treat or prevent imminent or life-threatening deterioration.  Critical care was time spent personally by me on the following activities: development of treatment plan with patient and/or surrogate as well as nursing, discussions with consultants, evaluation of patient's response to treatment, examination of patient, obtaining history from patient or surrogate, ordering and performing treatments and interventions, ordering and review of laboratory studies, ordering and review of radiographic studies, pulse oximetry and re-evaluation of patient's condition. Care     Rolland Porter, MD 01/01/15 [phone]

## 2015-01-02 DIAGNOSIS — F319 Bipolar disorder, unspecified: Secondary | ICD-10-CM | POA: Diagnosis present

## 2015-01-02 DIAGNOSIS — F17210 Nicotine dependence, cigarettes, uncomplicated: Secondary | ICD-10-CM | POA: Diagnosis present

## 2015-01-02 DIAGNOSIS — J45909 Unspecified asthma, uncomplicated: Secondary | ICD-10-CM | POA: Diagnosis present

## 2015-01-02 DIAGNOSIS — Z79899 Other long term (current) drug therapy: Secondary | ICD-10-CM | POA: Diagnosis not present

## 2015-01-02 DIAGNOSIS — J9801 Acute bronchospasm: Secondary | ICD-10-CM | POA: Diagnosis not present

## 2015-01-02 DIAGNOSIS — F411 Generalized anxiety disorder: Secondary | ICD-10-CM | POA: Diagnosis present

## 2015-01-02 DIAGNOSIS — Z7952 Long term (current) use of systemic steroids: Secondary | ICD-10-CM | POA: Diagnosis not present

## 2015-01-02 DIAGNOSIS — Z87442 Personal history of urinary calculi: Secondary | ICD-10-CM | POA: Diagnosis not present

## 2015-01-02 DIAGNOSIS — Z9884 Bariatric surgery status: Secondary | ICD-10-CM | POA: Diagnosis not present

## 2015-01-02 DIAGNOSIS — I10 Essential (primary) hypertension: Secondary | ICD-10-CM | POA: Diagnosis present

## 2015-01-02 DIAGNOSIS — J209 Acute bronchitis, unspecified: Secondary | ICD-10-CM | POA: Diagnosis not present

## 2015-01-02 DIAGNOSIS — Z6841 Body Mass Index (BMI) 40.0 and over, adult: Secondary | ICD-10-CM | POA: Diagnosis not present

## 2015-01-02 LAB — COMPREHENSIVE METABOLIC PANEL
ALBUMIN: 3.3 g/dL — AB (ref 3.5–5.0)
ALK PHOS: 67 U/L (ref 38–126)
ALT: 17 U/L (ref 14–54)
ANION GAP: 10 (ref 5–15)
AST: 23 U/L (ref 15–41)
BUN: 12 mg/dL (ref 6–20)
CALCIUM: 9 mg/dL (ref 8.9–10.3)
CHLORIDE: 103 mmol/L (ref 101–111)
CO2: 25 mmol/L (ref 22–32)
Creatinine, Ser: 0.54 mg/dL (ref 0.44–1.00)
GFR calc Af Amer: >60 mL/min (ref >60)
GFR calc non Af Amer: >60 mL/min (ref >60)
GLUCOSE: 93 mg/dL (ref 65–99)
Potassium: 4.3 mmol/L (ref 3.5–5.1)
SODIUM: 138 mmol/L (ref 135–145)
Total Bilirubin: 0.4 mg/dL (ref 0.3–1.2)
Total Protein: 6.2 g/dL — ABNORMAL LOW (ref 6.5–8.1)

## 2015-01-02 LAB — CBC
HEMATOCRIT: 38.8 % (ref 36.0–46.0)
HEMOGLOBIN: 13.1 g/dL (ref 12.0–15.0)
MCH: 31.9 pg (ref 26.0–34.0)
MCHC: 33.8 g/dL (ref 30.0–36.0)
MCV: 94.4 fL (ref 78.0–100.0)
Platelets: 271 10*3/uL (ref 150–400)
RBC: 4.11 MIL/uL (ref 3.87–5.11)
RDW: 13.3 % (ref 11.5–15.5)
WBC: 14.5 10*3/uL — ABNORMAL HIGH (ref 4.0–10.5)

## 2015-01-02 MED ORDER — DEXTROSE 5 % IV SOLN
1.0000 g | INTRAVENOUS | Status: DC
  Administered 2015-01-02 – 2015-01-04 (×3): 1 g via INTRAVENOUS
  Filled 2015-01-02 (×5): qty 10

## 2015-01-02 MED ORDER — HYDROCODONE-ACETAMINOPHEN 5-325 MG PO TABS
1.0000 | ORAL_TABLET | Freq: Four times a day (QID) | ORAL | Status: DC | PRN
  Administered 2015-01-02 – 2015-01-04 (×3): 1 via ORAL
  Filled 2015-01-02 (×3): qty 1

## 2015-01-02 MED ORDER — METHYLPREDNISOLONE SODIUM SUCC 125 MG IJ SOLR: 60.0000 mg | Freq: Every day | INTRAMUSCULAR | Status: DC

## 2015-01-02 MED ORDER — AZITHROMYCIN 500 MG PO TABS
500.0000 mg | ORAL_TABLET | Freq: Every day | ORAL | Status: AC
  Administered 2015-01-02: 500 mg via ORAL
  Filled 2015-01-02: qty 1

## 2015-01-02 MED ORDER — POLYETHYLENE GLYCOL 3350 17 G PO PACK
17.0000 g | PACK | Freq: Two times a day (BID) | ORAL | Status: DC
  Administered 2015-01-02 – 2015-01-05 (×6): 17 g via ORAL
  Filled 2015-01-02 (×7): qty 1

## 2015-01-02 MED ORDER — IPRATROPIUM-ALBUTEROL 0.5-2.5 (3) MG/3ML IN SOLN
3.0000 mL | Freq: Four times a day (QID) | RESPIRATORY_TRACT | Status: DC
  Administered 2015-01-02 – 2015-01-04 (×9): 3 mL via RESPIRATORY_TRACT
  Filled 2015-01-02 (×9): qty 3

## 2015-01-02 MED ORDER — AZITHROMYCIN 500 MG PO TABS
250.0000 mg | ORAL_TABLET | Freq: Every day | ORAL | Status: DC
  Administered 2015-01-03 – 2015-01-04 (×2): 250 mg via ORAL
  Filled 2015-01-02 (×2): qty 1

## 2015-01-02 MED ORDER — METHYLPREDNISOLONE SODIUM SUCC 125 MG IJ SOLR
60.0000 mg | Freq: Three times a day (TID) | INTRAMUSCULAR | Status: DC
  Administered 2015-01-02 – 2015-01-05 (×10): 60 mg via INTRAVENOUS
  Filled 2015-01-02 (×11): qty 2

## 2015-01-02 MED ORDER — ALBUTEROL SULFATE (2.5 MG/3ML) 0.083% IN NEBU: 2.5000 mg | INHALATION_SOLUTION | Freq: Four times a day (QID) | RESPIRATORY_TRACT | Status: DC

## 2015-01-02 NOTE — Progress Notes (Signed)
[facility] PROGRESS NOTE  Ashley Franklin ZOX:[id number] DOB: 1971-02-07 DOA: 01/01/2015 PCP: No PCP Per Patient  Assessment/Plan: 1-Acute Bronchitis, bronchospasm;  Continue with albuterol. Will add ipratropium.  Change solumedrol to every 8 hours.  Will add ceftriaxone. Continue with oral ciprofloxacin.  IS.   Bipolar affective disorder - Continue home meds including quetiapine, trileptal, stelazine, doxazosin   Anxiety state - Continue home meds amitriptyline, valium  Code Status: Full code.  Family Communication: care discussed with patient Disposition Plan: Remain inpatient   Consultants:  none  Procedures:  none  Antibiotics:  Ceftriaxone  azithromycin  HPI/Subjective: Still SOB, mild improvement. Coughing a lot.  Sh quit smoking last December.   Objective: Filed Vitals:   01/02/15 0617  BP: 100/66  Pulse: 66  Temp: 97.8 F (36.6 C)  Resp: 18   No intake or output data in the 24 hours ending 01/02/15 0823 Filed Weights   01/01/15 1804  Weight: 125.193 kg (276 lb)    Exam:   General:  Alert, speaking on full sentences.   Cardiovascular: S 1, S 2 RRR  Respiratory: Bilateral ronchus diffuse, wheezing  Abdomen: BS presents, soft, nt  Musculoskeletal: no edema  Data Reviewed: Basic Metabolic Panel:  Recent Labs Lab 01/01/15 1320 01/01/15 1955 01/02/15 0522  NA 138  --  138  K 4.2  --  4.3  CL 104  --  103  CO2 24  --  25  GLUCOSE 165*  --  93  BUN 7  --  12  CREATININE 0.60 0.76 0.54  CALCIUM 8.8*  --  9.0   Liver Function Tests:  Recent Labs Lab 01/02/15 0522  AST 23  ALT 17  ALKPHOS 67  BILITOT 0.4  PROT 6.2*  ALBUMIN 3.3*   No results for input(s): LIPASE, AMYLASE in the last 168 hours. No results for input(s): AMMONIA in the last 168 hours. CBC:  Recent Labs Lab 01/01/15 1320 01/01/15 1955 01/02/15 0522  WBC 14.2* 13.6* 14.5*  NEUTROABS 12.4*  --   --   HGB 13.9 13.9 13.1  HCT 40.8 40.4 38.8   MCV 94.7 93.7 94.4  PLT 258 255 271   Cardiac Enzymes:  Recent Labs Lab 01/01/15 1320  TROPONINI <0.03   BNP (last 3 results)  Recent Labs  01/01/15 1320  BNP 157.3*    ProBNP (last 3 results) No results for input(s): PROBNP in the last 8760 hours.  CBG: No results for input(s): GLUCAP in the last 168 hours.  No results found for this or any previous visit (from the past 240 hour(s)).   Studies: Dg Chest 2 View  01/01/2015   CLINICAL DATA:  Cough and shortness of breath for 2 days.  EXAM: CHEST  2 VIEW  COMPARISON:  12/31/2014.  FINDINGS: The cardiac silhouette, mediastinal and hilar contours are within normal limits and stable. There are bronchitic lung changes with peribronchial thickening and increased interstitial markings. Findings suggest bronchitis or interstitial pneumonitis. No airspace consolidation or pleural effusion. The bony thorax is intact.  IMPRESSION: Findings suggest bronchitis.  No focal infiltrates.   Electronically Signed   By: Rudie Meyer M.D.   On: 01/01/2015 14:48   Dg Chest 2 View  12/31/2014   CLINICAL DATA:  Shortness of breath today. Sinus symptoms 12/30/2014. Initial encounter.  EXAM: CHEST  2 VIEW  COMPARISON:  None.  FINDINGS: Heart size and mediastinal contours are within normal limits. Both lungs are clear. Visualized skeletal structures are unremarkable.  IMPRESSION: Negative  exam.   Electronically Signed   By: Drusilla Kanner M.D.   On: 12/31/2014 09:21    Scheduled Meds: . amitriptyline  25 mg Oral TID  . [START ON 01/03/2015] azithromycin  250 mg Oral Daily  . benzonatate  100 mg Oral Q8H  . cefTRIAXone (ROCEPHIN)  IV  1 g Intravenous Q24H  . diazepam  10 mg Oral TID  . docusate sodium  100 mg Oral BID  . doxazosin  1 mg Oral Daily  . enoxaparin (LOVENOX) injection  40 mg Subcutaneous Q24H  . FLUoxetine  60 mg Oral Daily  . ipratropium-albuterol  3 mL Nebulization Q6H  . methylPREDNISolone (SOLU-MEDROL) injection  60 mg  Intravenous 3 times per day  . Oxcarbazepine  300 mg Oral TID  . polyethylene glycol  17 g Oral BID  . QUEtiapine  200 mg Oral QHS  . trifluoperazine  2 mg Oral BID  . valACYclovir  1,000 mg Oral Daily  . zolpidem  5 mg Oral QHS   Continuous Infusions:   Active Problems:   Bronchospasm   Bronchospasm with bronchitis, acute   Bipolar affective disorder   Anxiety state    Time spent: 35 minutes.      Hartley Barefoot A  [facility] Pager [phone]. If 7PM-7AM, please contact night-coverage at [email or web address], password [facility] 01/02/2015, 8:23 AM

## 2015-01-03 NOTE — Progress Notes (Signed)
[facility] PROGRESS NOTE  Ashley Franklin:[id number] DOB: 15-Jun-1970 DOA: 01/01/2015 PCP: No PCP Per Patient  Assessment/Plan: 1-Acute Bronchitis, bronchospasm;  Continue with albuterol and  ipratropium.  Continue with solumedrol  every 8 hours.  Continue with Azithromycin and ceftriaxone.  IS.  Feels some improvement today. Not at baseline yet.  Bipolar affective disorder - Continue home meds including quetiapine, trileptal, stelazine, doxazosin   Anxiety state - Continue home meds amitriptyline, valium  Code Status: Full code.  Family Communication: care discussed with patient Disposition Plan: Remain inpatient   Consultants:  none  Procedures:  none  Antibiotics:  Ceftriaxone  azithromycin  HPI/Subjective: Feels better today, but still with SOB and wheezing.  Cough improved.   Objective: Filed Vitals:   01/03/15 1016  BP: 140/62  Pulse:   Temp:   Resp:     Intake/Output Summary (Last 24 hours) at 01/03/15 1342 Last data filed at 01/02/15 1351  Gross per 24 hour  Intake    220 ml  Output      0 ml  Net    220 ml   Filed Weights   01/01/15 1804  Weight: 125.193 kg (276 lb)    Exam:   General:  Alert, speaking on full sentences.   Cardiovascular: S 1, S 2 RRR  Respiratory: Bilateral ronchus diffuse, wheezing  Abdomen: BS presents, soft, nt  Musculoskeletal: no edema  Data Reviewed: Basic Metabolic Panel:  Recent Labs Lab 01/01/15 1320 01/01/15 1955 01/02/15 0522  NA 138  --  138  K 4.2  --  4.3  CL 104  --  103  CO2 24  --  25  GLUCOSE 165*  --  93  BUN 7  --  12  CREATININE 0.60 0.76 0.54  CALCIUM 8.8*  --  9.0   Liver Function Tests:  Recent Labs Lab 01/02/15 0522  AST 23  ALT 17  ALKPHOS 67  BILITOT 0.4  PROT 6.2*  ALBUMIN 3.3*   No results for input(s): LIPASE, AMYLASE in the last 168 hours. No results for input(s): AMMONIA in the last 168 hours. CBC:  Recent Labs Lab 01/01/15 1320  01/01/15 1955 01/02/15 0522  WBC 14.2* 13.6* 14.5*  NEUTROABS 12.4*  --   --   HGB 13.9 13.9 13.1  HCT 40.8 40.4 38.8  MCV 94.7 93.7 94.4  PLT 258 255 271   Cardiac Enzymes:  Recent Labs Lab 01/01/15 1320  TROPONINI <0.03   BNP (last 3 results)  Recent Labs  01/01/15 1320  BNP 157.3*    ProBNP (last 3 results) No results for input(s): PROBNP in the last 8760 hours.  CBG: No results for input(s): GLUCAP in the last 168 hours.  No results found for this or any previous visit (from the past 240 hour(s)).   Studies: Dg Chest 2 View  01/01/2015   CLINICAL DATA:  Cough and shortness of breath for 2 days.  EXAM: CHEST  2 VIEW  COMPARISON:  12/31/2014.  FINDINGS: The cardiac silhouette, mediastinal and hilar contours are within normal limits and stable. There are bronchitic lung changes with peribronchial thickening and increased interstitial markings. Findings suggest bronchitis or interstitial pneumonitis. No airspace consolidation or pleural effusion. The bony thorax is intact.  IMPRESSION: Findings suggest bronchitis.  No focal infiltrates.   Electronically Signed   By: Rudie Meyer M.D.   On: 01/01/2015 14:48    Scheduled Meds: . amitriptyline  25 mg Oral TID  . azithromycin  250 mg Oral  Daily  . benzonatate  100 mg Oral Q8H  . cefTRIAXone (ROCEPHIN)  IV  1 g Intravenous Q24H  . diazepam  10 mg Oral TID  . docusate sodium  100 mg Oral BID  . doxazosin  1 mg Oral Daily  . enoxaparin (LOVENOX) injection  40 mg Subcutaneous Q24H  . FLUoxetine  60 mg Oral Daily  . ipratropium-albuterol  3 mL Nebulization Q6H  . methylPREDNISolone (SOLU-MEDROL) injection  60 mg Intravenous 3 times per day  . Oxcarbazepine  300 mg Oral TID  . polyethylene glycol  17 g Oral BID  . QUEtiapine  200 mg Oral QHS  . trifluoperazine  2 mg Oral BID  . valACYclovir  1,000 mg Oral Daily  . zolpidem  5 mg Oral QHS   Continuous Infusions:   Active Problems:   Bronchospasm   Bronchospasm with  bronchitis, acute   Bipolar affective disorder   Anxiety state    Time spent: 35 minutes.      Hartley Barefoot A  [facility] Pager [phone]. If 7PM-7AM, please contact night-coverage at [email or web address], password [facility] 01/03/2015, 1:42 PM  LOS: 1 day

## 2015-01-04 DIAGNOSIS — J209 Acute bronchitis, unspecified: Principal | ICD-10-CM

## 2015-01-04 LAB — CBC
HEMATOCRIT: 40.4 % (ref 36.0–46.0)
Hemoglobin: 13.7 g/dL (ref 12.0–15.0)
MCH: 31.6 pg (ref 26.0–34.0)
MCHC: 33.9 g/dL (ref 30.0–36.0)
MCV: 93.3 fL (ref 78.0–100.0)
PLATELETS: 245 10*3/uL (ref 150–400)
RBC: 4.33 MIL/uL (ref 3.87–5.11)
RDW: 13 % (ref 11.5–15.5)
WBC: 12.5 10*3/uL — AB (ref 4.0–10.5)

## 2015-01-04 LAB — BASIC METABOLIC PANEL
Anion gap: 10 (ref 5–15)
BUN: 11 mg/dL (ref 6–20)
CALCIUM: 9.2 mg/dL (ref 8.9–10.3)
CO2: 27 mmol/L (ref 22–32)
Chloride: 102 mmol/L (ref 101–111)
Creatinine, Ser: 0.56 mg/dL (ref 0.44–1.00)
GFR calc Af Amer: >60 mL/min (ref >60)
GLUCOSE: 104 mg/dL — AB (ref 65–99)
Potassium: 4.4 mmol/L (ref 3.5–5.1)
Sodium: 139 mmol/L (ref 135–145)

## 2015-01-04 MED ORDER — ALBUTEROL SULFATE (2.5 MG/3ML) 0.083% IN NEBU: 2.0000 mL | INHALATION_SOLUTION | RESPIRATORY_TRACT | Status: DC | PRN

## 2015-01-04 MED ORDER — IPRATROPIUM-ALBUTEROL 0.5-2.5 (3) MG/3ML IN SOLN
3.0000 mL | Freq: Three times a day (TID) | RESPIRATORY_TRACT | Status: DC
  Administered 2015-01-04 – 2015-01-05 (×2): 3 mL via RESPIRATORY_TRACT
  Filled 2015-01-04 (×2): qty 3

## 2015-01-04 MED ORDER — FUROSEMIDE 10 MG/ML IJ SOLN
40.0000 mg | Freq: Once | INTRAMUSCULAR | Status: AC
  Administered 2015-01-04: 40 mg via INTRAVENOUS
  Filled 2015-01-04: qty 4

## 2015-01-04 MED ORDER — IPRATROPIUM-ALBUTEROL 0.5-2.5 (3) MG/3ML IN SOLN
3.0000 mL | RESPIRATORY_TRACT | Status: DC
  Administered 2015-01-04: 3 mL via RESPIRATORY_TRACT
  Filled 2015-01-04: qty 3

## 2015-01-04 NOTE — Progress Notes (Signed)
Notified Respiratory pt needed a flutter valve

## 2015-01-04 NOTE — Progress Notes (Signed)
[facility] PROGRESS NOTE  Ashley Franklin:[id number] DOB: [birth year] DOA: 01/01/2015 PCP: No PCP Per Patient  Assessment/Plan: Acute bronchitis with bronchospasm Continue DuoNeb. Still has diffuse wheezing. Continue IV Solu-Medrol. Continue with IV Rocephin and azithromycin. Continue incentive spirometry. Reports quitting smoking. No history of asthma. -Has bilateral trace pitting edema. Check 2-D echo if unimproved in am.  Bipolar affective disorder Continue home medications.  Anxiety state Continue Valium and amitriptyline.  Diet: Regular  DVT prophylaxis: Subcutaneous Lovenox  Code Status: Full code Family Communication: None at bedside Disposition Plan: Home tomorrow if continues to improve   Consultants:  None  Procedures:  None  Antibiotics:  IV Rocephin and azithromycin  HPI/Subjective: Patient reports still having some shortness of breath and wheezing. Reports nonproductive cough.  Objective: Filed Vitals:   01/04/15 1127  BP:   Pulse: 89  Temp:   Resp:     Intake/Output Summary (Last 24 hours) at 01/04/15 1416 Last data filed at 01/04/15 1056  Gross per 24 hour  Intake    480 ml  Output    700 ml  Net   -220 ml   Filed Weights   01/01/15 1804  Weight: 125.193 kg (276 lb)    Exam:   General:  Middle aged obese female in no acute distress  HEENT: No pallor, moist oral mucosa  Chest: Scattered bilateral wheezing, no crackles  CVS: Normal S1 and S2, no murmurs  GI: Soft, nondistended, nontender, bowel sounds present  Musculoskeletal: Warm, trace bilateral edema  CNS: Alert and oriented  Data Reviewed: Basic Metabolic Panel:  Recent Labs Lab 01/01/15 1320 01/01/15 1955 01/02/15 0522 01/04/15 0533  NA 138  --  138 139  K 4.2  --  4.3 4.4  CL 104  --  103 102  CO2 24  --  25 27  GLUCOSE 165*  --  93 104*  BUN 7  --  12 11  CREATININE 0.60 0.76 0.54 0.56  CALCIUM 8.8*  --  9.0 9.2   Liver Function  Tests:  Recent Labs Lab 01/02/15 0522  AST 23  ALT 17  ALKPHOS 67  BILITOT 0.4  PROT 6.2*  ALBUMIN 3.3*   No results for input(s): LIPASE, AMYLASE in the last 168 hours. No results for input(s): AMMONIA in the last 168 hours. CBC:  Recent Labs Lab 01/01/15 1320 01/01/15 1955 01/02/15 0522 01/04/15 0533  WBC 14.2* 13.6* 14.5* 12.5*  NEUTROABS 12.4*  --   --   --   HGB 13.9 13.9 13.1 13.7  HCT 40.8 40.4 38.8 40.4  MCV 94.7 93.7 94.4 93.3  PLT 258 255 271 245   Cardiac Enzymes:  Recent Labs Lab 01/01/15 1320  TROPONINI <0.03   BNP (last 3 results)  Recent Labs  01/01/15 1320  BNP 157.3*    ProBNP (last 3 results) No results for input(s): PROBNP in the last 8760 hours.  CBG: No results for input(s): GLUCAP in the last 168 hours.  No results found for this or any previous visit (from the past 240 hour(s)).   Studies: No results found.  Scheduled Meds: . amitriptyline  25 mg Oral TID  . azithromycin  250 mg Oral Daily  . benzonatate  100 mg Oral Q8H  . cefTRIAXone (ROCEPHIN)  IV  1 g Intravenous Q24H  . diazepam  10 mg Oral TID  . docusate sodium  100 mg Oral BID  . doxazosin  1 mg Oral Daily  . enoxaparin (LOVENOX) injection  40  mg Subcutaneous Q24H  . FLUoxetine  60 mg Oral Daily  . ipratropium-albuterol  3 mL Nebulization TID  . methylPREDNISolone (SOLU-MEDROL) injection  60 mg Intravenous 3 times per day  . Oxcarbazepine  300 mg Oral TID  . polyethylene glycol  17 g Oral BID  . QUEtiapine  200 mg Oral QHS  . trifluoperazine  2 mg Oral BID  . valACYclovir  1,000 mg Oral Daily  . zolpidem  5 mg Oral QHS   Continuous Infusions:      Time spent: 20 minutes    Audri Kozub  [facility] Pager [phone]. If 7PM-7AM, please contact night-coverage at [email or web address], password [facility] 01/04/2015, 2:16 PM  LOS: 2 days

## 2015-01-04 NOTE — Progress Notes (Signed)
RT instructed patient on the use of flutter valve.  Patient exhibited competence in using the flutter valve. RT will continue to monitor.

## 2015-01-04 NOTE — Care Management Note (Signed)
Case Management Note  Patient Details  Name: Ashley Franklin MRN: [id number] Date of Birth: [birth year]  Subjective/Objective:                 Spoke with patient, independent, self care. Has [place] medicaid. Instructed patient on how to change her [facility] Medicaid to [place], patient understood to go to DSS after discharge to get insurance updated. Patient agreed to follow up with [facility], as she does not have a local PCP. Patient given Pamphlet for [facility] and map to [facility] for first appointment.  Patient states that she moved recently and is living with Fiance in [place]. 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 [employer] [employer] national certification when CM walked in room. No other needs identified at this time. Patient currently on O2, but nondependent.    Action/Plan:  Will continue to follow and offer resources as needed.   Expected Discharge Date:                  Expected Discharge Plan:  Home/Self Care  In-House Referral:     Discharge planning Services  CM Consult  Post Acute Care Choice:    Choice offered to:     DME Arranged:    DME Agency:     HH Arranged:    HH Agency:     Status of Service:  In process, will continue to follow  Medicare Important Message Given:    Date Medicare IM Given:    Medicare IM give by:    Date Additional Medicare IM Given:    Additional Medicare Important Message give by:     If discussed at Long Length of Stay Meetings, dates discussed:    Additional Comments:  Lawerance Sabal, RN 01/04/2015, 11:38 AM

## 2015-01-04 NOTE — Progress Notes (Signed)
Patient received pamphlet from [facility]. CM explained to patient that they may use the on site pharmacy to fill prescriptions given to them at discharge. Patient aware that the [facility] will not fill narcotics or pain medications prior to the patient being seen by one of their physicians.  Patient aware that they must be seen as a patient prior to the pharmacy filling the prescriptions a second time.  

## 2015-01-05 DIAGNOSIS — J9801 Acute bronchospasm: Secondary | ICD-10-CM | POA: Insufficient documentation

## 2015-01-05 DIAGNOSIS — F319 Bipolar disorder, unspecified: Secondary | ICD-10-CM

## 2015-01-05 MED ORDER — PREDNISONE 20 MG PO TABS: 40.0000 mg | ORAL_TABLET | Freq: Every day | ORAL | Status: AC

## 2015-01-05 MED ORDER — AZITHROMYCIN 500 MG PO TABS
500.0000 mg | ORAL_TABLET | Freq: Once | ORAL | Status: AC
  Administered 2015-01-05: 500 mg via ORAL
  Filled 2015-01-05: qty 1

## 2015-01-05 NOTE — Care Management Note (Signed)
Case Management Note  Patient Details  Name: Ashley Franklin MRN: [id number] Date of Birth: 07/21/1970  Subjective/Objective:                 Spoke with patient, independent, self care. Has [place] medicaid. Instructed patient on how to change her [facility] Medicaid to [place], patient understood to go to DSS after discharge to get insurance updated. Patient agreed to follow up with [facility], as she does not have a local PCP. Patient given Pamphlet for [facility] and map to [facility] for first appointment. Patient states that she moved recently and is living with Fiance in [place]. 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 No other needs identified at this time. Patient currently on O2, but nondependent.   Patient discharged to home in room air. No Further CM needs identified.     Action/Plan:   Expected Discharge Date:                  Expected Discharge Plan:  Home/Self Care  In-House Referral:     Discharge planning Services  CM Consult  Post Acute Care Choice:    Choice offered to:     DME Arranged:    DME Agency:     HH Arranged:    HH Agency:     Status of Service:  Completed, signed off  Medicare Important Message Given:    Date Medicare IM Given:    Medicare IM give by:    Date Additional Medicare IM Given:    Additional Medicare Important Message give by:     If discussed at Long Length of Stay Meetings, dates discussed:    Additional Comments:  Lawerance Sabal, RN 01/05/2015, 11:49 AM

## 2015-01-05 NOTE — Discharge Summary (Signed)
Physician Discharge Summary  Ashley Franklin ZOX:[id number] DOB: 14-Nov-1970 DOA: 01/01/2015  PCP: No PCP Per Patient  Admit date: 01/01/2015 Discharge date: 01/05/2015  Time spent:<30 minutes  Recommendations for Outpatient Follow-up:  1. Discharge home on 5 more days of oral prednisone. Completed  antibiotic prior to discharge.  Discharge Diagnoses:  Principal Problem:   Bronchospasm with bronchitis, acute   Active Problems:   Bipolar affective disorder   Anxiety state   Morbid obesity   Discharge Condition: fair  Diet recommendation: regular  Filed Weights   01/01/15 1804  Weight: 125.193 kg (276 lb)    History of present illness:  44yo woman with PMH of  bipolar affective disorder, anxiety (on multiple medications) who presented to the hospital with persistent SOB and cough.on 8/17 she had  a severe allergy attack with watery eyes, runny nose, SOB, wheezing, etc. The watery eyes and rhinorrhea resolved, but had persistent shortness of breath associated with straining in her chest, difficulty getting air out, pain with deep inhalation over back rib cage, paroxysms of causing which cause occasional incontinence. Further symptoms include movement making the coughing worse and difficulty lying flat.Denies fever or productive cough. Denies nausea or vomiting, abdominal pain, bowel or urinary symptoms. Denies recent travel or sick contact. .   She was initially seen in the ED on 8/18, where she was given prednisone, albuterol including 1 hour continuous nebs and discharged home on oral prednisone, zyrtec and albuterol. She reports these medications did not help and she returned to the ED on 8/19. In the ED, she received continuous nebs again and solumedrol. She started having desaturations and these persisted with coughing. CXR on 8/19 showed possible bronchitis. Patient admitted to medical floor for further management.  Hospital Course:  Acute bronchitis with  bronchospasm Patient started on IV Solu-Medrol, empiric IV Rocephin and azithromycin, scheduled DuoNeb, antitussives . -Patient reports that she will quit smoking forever.  -Symptoms Much improved today.will discharge on 5 more days of oral prednisone 40 mg daily. Completed antibiotics prior to discharge.  Bipolar affective disorder and anxiety state On multiple medications including amitriptyline, Valium, stelazine, seroquel, fluoxetine and trileptal.  Takes valtrex prn for mouth ulcers associated with stress.  Hypertension On Cardura  Morbid obesity Counseled on weight loss and exercise.   Patient stable for discharge home. Appointment has been scheduled at the [facility] clinic. Patient plans to continue getting prescriptions from her psychiatrist in [place] for her bipolar disorder and anxiety until she  establishes care with a psychiatrist in the community.  Diet: Regular  Procedures:  none  Consultations:  none  Discharge Exam: Filed Vitals:   01/05/15 0542  BP: 122/67  Pulse: 60  Temp: 98 F (36.7 C)  Resp: 18     General: Middle aged obese female in no acute distress  HEENT: No pallor, moist oral mucosa  Chest: clear b/l, no added sounds  CVS: Normal S1 and S2, no murmurs  GI: Soft, nondistended, nontender, bowel sounds present  Musculoskeletal: Warm,  No edema  CNS: Alert and oriented  Discharge Instructions    Current Discharge Medication List    CONTINUE these medications which have CHANGED   Details  predniSONE (DELTASONE) 20 MG tablet Take 2 tablets (40 mg total) by mouth daily with breakfast. Qty: 10 tablet, Refills: 0      CONTINUE these medications which have NOT CHANGED   Details  amitriptyline (ELAVIL) 25 MG tablet Take 25 mg by mouth 3 (three) times daily.  benzonatate (TESSALON) 100 MG capsule Take 1 capsule (100 mg total) by mouth every 8 (eight) hours. Qty: 21 capsule, Refills: 0    chlorpheniramine-HYDROcodone  (TUSSIONEX PENNKINETIC ER) 10-8 MG/5ML SUER Take 5 mLs by mouth every 12 (twelve) hours as needed for cough. Qty: 60 mL, Refills: 0    diazepam (VALIUM) 10 MG tablet Take 10 mg by mouth 3 (three) times daily.    doxazosin (CARDURA) 1 MG tablet Take 1 mg by mouth daily.    FLUoxetine (PROZAC) 20 MG tablet Take 60 mg by mouth daily.     Oxcarbazepine (TRILEPTAL) 300 MG tablet Take 300 mg by mouth 3 (three) times daily.    QUEtiapine (SEROQUEL) 200 MG tablet Take 200 mg by mouth at bedtime.    trifluoperazine (STELAZINE) 2 MG tablet Take 2 mg by mouth 2 (two) times daily.    valACYclovir (VALTREX) 1000 MG tablet Take 1,000 mg by mouth daily.    zolpidem (AMBIEN) 10 MG tablet Take 10 mg by mouth at bedtime.     albuterol (PROVENTIL HFA;VENTOLIN HFA) 108 (90 BASE) MCG/ACT inhaler Inhale 1-2 puffs into the lungs every 6 (six) hours as needed for wheezing. Qty: 1 Inhaler, Refills: 0      STOP taking these medications     ibuprofen (ADVIL,MOTRIN) 800 MG tablet        Allergies  Allergen Reactions  . Nsaids Other (See Comments)    Contraindicated with gastric bypass   Follow-up Information    Follow up with [facility]     On 01/12/2015.   Why:  1:00 pm at [facility]. [address] (by [facility]). Please bring your photo ID. YOu may fill your Rx at the [facility] pharmacy after discharge. [facility] pamphlet given, Map to clinic given.   Contact information:   [address] [place] [address] [phone]       The results of significant diagnostics from this hospitalization (including imaging, microbiology, ancillary and laboratory) are listed below for reference.    Significant Diagnostic Studies: Dg Chest 2 View  01/01/2015   CLINICAL DATA:  Cough and shortness of breath for 2 days.  EXAM: CHEST  2 VIEW  COMPARISON:  12/31/2014.  FINDINGS: The cardiac silhouette, mediastinal and hilar contours are within normal  limits and stable. There are bronchitic lung changes with peribronchial thickening and increased interstitial markings. Findings suggest bronchitis or interstitial pneumonitis. No airspace consolidation or pleural effusion. The bony thorax is intact.  IMPRESSION: Findings suggest bronchitis.  No focal infiltrates.   Electronically Signed   By: Rudie Meyer M.D.   On: 01/01/2015 14:48   Dg Chest 2 View  12/31/2014   CLINICAL DATA:  Shortness of breath today. Sinus symptoms 12/30/2014. Initial encounter.  EXAM: CHEST  2 VIEW  COMPARISON:  None.  FINDINGS: Heart size and mediastinal contours are within normal limits. Both lungs are clear. Visualized skeletal structures are unremarkable.  IMPRESSION: Negative exam.   Electronically Signed   By: Drusilla Kanner M.D.   On: 12/31/2014 09:21    Microbiology: No results found for this or any previous visit (from the past 240 hour(s)).   Labs: Basic Metabolic Panel:  Recent Labs Lab 01/01/15 1320 01/01/15 1955 01/02/15 0522 01/04/15 0533  NA 138  --  138 139  K 4.2  --  4.3 4.4  CL 104  --  103 102  CO2 24  --  25 27  GLUCOSE 165*  --  93 104*  BUN 7  --  12 11  CREATININE 0.60 0.76 0.54 0.56  CALCIUM 8.8*  --  9.0 9.2   Liver Function Tests:  Recent Labs Lab 01/02/15 0522  AST 23  ALT 17  ALKPHOS 67  BILITOT 0.4  PROT 6.2*  ALBUMIN 3.3*   No results for input(s): LIPASE, AMYLASE in the last 168 hours. No results for input(s): AMMONIA in the last 168 hours. CBC:  Recent Labs Lab 01/01/15 1320 01/01/15 1955 01/02/15 0522 01/04/15 0533  WBC 14.2* 13.6* 14.5* 12.5*  NEUTROABS 12.4*  --   --   --   HGB 13.9 13.9 13.1 13.7  HCT 40.8 40.4 38.8 40.4  MCV 94.7 93.7 94.4 93.3  PLT 258 255 271 245   Cardiac Enzymes:  Recent Labs Lab 01/01/15 1320  TROPONINI <0.03   BNP: BNP (last 3 results)  Recent Labs  01/01/15 1320  BNP 157.3*    ProBNP (last 3 results) No results for input(s): PROBNP in the last 8760  hours.  CBG: No results for input(s): GLUCAP in the last 168 hours.     SignedEddie North  [facility] 01/05/2015, 8:50 AM

## 2015-01-05 NOTE — Progress Notes (Signed)
Discharge instructions explained to pt. All questions answered. IV removed. Skin is intact. Pt taken out by wheelchair. Husband remains with pt.

## 2015-01-05 NOTE — Discharge Instructions (Signed)

## 2015-01-12 ENCOUNTER — Encounter: Payer: Self-pay | Admitting: Family Medicine

## 2015-01-12 ENCOUNTER — Ambulatory Visit (INDEPENDENT_AMBULATORY_CARE_PROVIDER_SITE_OTHER): Payer: Medicaid - Out of State | Admitting: Family Medicine

## 2015-01-12 VITALS — BP 129/79 | HR 72 | Temp 98.9°F | Resp 16 | Ht 64.0 in | Wt 271.0 lb

## 2015-01-12 DIAGNOSIS — Z7189 Other specified counseling: Secondary | ICD-10-CM | POA: Diagnosis not present

## 2015-01-12 DIAGNOSIS — Z7689 Persons encountering health services in other specified circumstances: Secondary | ICD-10-CM

## 2015-01-12 NOTE — Patient Instructions (Signed)
Make an appointment as soon as you have your insurance to review and discuss health maintenance needs We will work on appointment with mental health.  Continue to work on weight loss. Continue current medications.

## 2015-01-12 NOTE — Progress Notes (Signed)
Patient ID: Ashley Franklin, female   DOB: March 15, 1971, 44 y.o.   MRN: [id number]   Ashley Franklin, is a 44 y.o. female  WUJ:[id number]  [id number]  DOB - 1971/04/14  CC:  Chief Complaint  Patient presents with  . Establish Care       HPI: Ashley Franklin is a 44 y.o. female here to establish care. She is recently here from [place]. Her major health concern is mental illness in the form of Bipolar Depression and anxiety. She has a history of SBO and Kidney Stones.  She is on numerous medications for mental health. She currently has a month supply. She will be going back to her psychiatrist in [place] later this month and anticipates getting a 3 month supply to last her until she gets insurance here and is able to establish with a psychiatritist. She was recently hospitalized for an upper respiratory illness and is much improved. She is no longer using her respiratory medictions prescribed at that time.She is morbidly obese and is currently working on weight loss. We does not smoke and uses rare alcohol. No illicit drug use. Allergies  Allergen Reactions  . Nsaids Other (See Comments)    Contraindicated with gastric bypass   Past Medical History  Diagnosis Date  . Kidney stone   . Anxiety   . Bipolar affective disorder   . SBO (small bowel obstruction)    Current Outpatient Prescriptions on File Prior to Visit  Medication Sig Dispense Refill  . albuterol (PROVENTIL HFA;VENTOLIN HFA) 108 (90 BASE) MCG/ACT inhaler Inhale 1-2 puffs into the lungs every 6 (six) hours as needed for wheezing. 1 Inhaler 0  . amitriptyline (ELAVIL) 25 MG tablet Take 25 mg by mouth 3 (three) times daily.    . benzonatate (TESSALON) 100 MG capsule Take 1 capsule (100 mg total) by mouth every 8 (eight) hours. 21 capsule 0  . doxazosin (CARDURA) 1 MG tablet Take 1 mg by mouth daily.    Marland Kitchen FLUoxetine (PROZAC) 20 MG tablet Take 60 mg by mouth daily.     . Oxcarbazepine (TRILEPTAL) 300 MG tablet Take 300 mg  by mouth 3 (three) times daily.    . QUEtiapine (SEROQUEL) 200 MG tablet Take 200 mg by mouth at bedtime.    Marland Kitchen trifluoperazine (STELAZINE) 2 MG tablet Take 2 mg by mouth 2 (two) times daily.    . valACYclovir (VALTREX) 1000 MG tablet Take 1,000 mg by mouth daily.    Marland Kitchen zolpidem (AMBIEN) 10 MG tablet Take 10 mg by mouth at bedtime.     . chlorpheniramine-HYDROcodone (TUSSIONEX PENNKINETIC ER) 10-8 MG/5ML SUER Take 5 mLs by mouth every 12 (twelve) hours as needed for cough. (Patient not taking: Reported on 01/12/2015) 60 mL 0  . diazepam (VALIUM) 10 MG tablet Take 10 mg by mouth 3 (three) times daily.     No current facility-administered medications on file prior to visit.   Family History  Problem Relation Age of Onset  . Diabetes Mother   . Cancer Mother    Social History   Social History  . Marital Status: Single    Spouse Name: N/A  . Number of Children: N/A  . Years of Education: N/A   Occupational History  . Not on file.   Social History Main Topics  . Smoking status: Former Smoker    Quit date: 05/11/2014  . Smokeless tobacco: Never Used  . Alcohol Use: No  . Drug Use: No  . Sexual Activity: Yes  Birth Control/ Protection: Surgical   Other Topics Concern  . Not on file   Social History Narrative    Review of Systems: Constitutional: Negative for fever, chills, appetite change, weight loss,  Fatigue.She is working on weight loss. HENT: Negative for ear pain, ear discharge.nose bleeds Eyes: Negative for pain, discharge, redness, itching and visual disturbance. Neck: Negative for pain, stiffness Respiratory: Negative for cough, shortness of breath,   Cardiovascular: Negative for chest pain, palpitations and leg swelling. Gastrointestinal: Negative for abdominal distention, abdominal pain, nausea, vomiting, diarrhea, constipations Genitourinary: Negative for dysuria, urgency, frequency, hematuria, flank pain,  Musculoskeletal: Negative for back pain, joint pain,  joint  swelling, arthralgia and gait problem.Negative for weakness. Neurological: Negative for dizziness, tremors, seizures, syncope,   light-headedness, numbness and headaches.  Hematological: Negative for easy bruising or bleeding Psychiatric/Behavioral: Negative for depression, anxiety, decreased concentration, confusion Gyn: She has had an endometrial ablation and does not have periods. She had a PAP last in December, which she reports as normal. She is due for a mammogram.         Objective:   Filed Vitals:   01/12/15 1308  BP: 129/79  Pulse: 72  Temp: 98.9 F (37.2 C)  Resp: 16    Physical Exam: Constitutional: Patient appears well-developed and well-nourished. No distress. HENT: Normocephalic, atraumatic, External right and left ear normal. Oropharynx is clear and moist.  Eyes: Conjunctivae and EOM are normal. PERRLA, no scleral icterus. Neck: Normal ROM. Neck supple. No lymphadenopathy, No thyromegaly. CVS: RRR, S1/S2 +, no murmurs, no gallops, no rubs Pulmonary: Effort and breath sounds normal, no stridor, rhonchi, wheezes, rales.  Abdominal: Soft. Normoactive BS,, no distension, tenderness, rebound or guarding.  Musculoskeletal: Normal range of motion. No edema and no tenderness.  Neuro: Alert.Normal muscle tone coordination. Non-focal Skin: Skin is warm and dry. No rash noted. Not diaphoretic. No erythema. No pallor. Psychiatric: Normal mood and affect. Behavior, judgment, thought content normal.  Lab Results  Component Value Date   WBC 12.5* 01/04/2015   HGB 13.7 01/04/2015   HCT 40.4 01/04/2015   MCV 93.3 01/04/2015   PLT 245 01/04/2015   Lab Results  Component Value Date   CREATININE 0.56 01/04/2015   BUN 11 01/04/2015   NA 139 01/04/2015   K 4.4 01/04/2015   CL 102 01/04/2015   CO2 27 01/04/2015    No results found for: HGBA1C Lipid Panel  No results found for: CHOL, TRIG, HDL, CHOLHDL, VLDL, LDLCALC     Assessment and plan:   1. Encounter to  establish care - Have reviewed information provided and we will request records from previous provider. -She has had recent bloodwork in hospital. Will wait until her insurance is in effective to do further labs.  2. Bipolar Depression - Will work on referral to mental health once she has insurance in about a month. -In the meantime continue medications per psych. In [place]  3. Health Maintenance -Follow-up when she has insurance to discuss health maintenance issues.    No Follow-up on file.  The patient was given clear instructions to go to ER or return to medical center if symptoms don't improve, worsen or new problems develop. The patient verbalized understanding.      Henrietta Hoover, MSN, FNP-BC   01/12/2015, 2:42 PM

## 2015-02-22 ENCOUNTER — Encounter: Payer: Self-pay | Admitting: *Deleted

## 2015-02-22 ENCOUNTER — Emergency Department (INDEPENDENT_AMBULATORY_CARE_PROVIDER_SITE_OTHER)
Admission: EM | Admit: 2015-02-22 | Discharge: 2015-02-22 | Disposition: A | Discharge status: Home / Self Care | Payer: 59 | Source: Home / Self Care | Attending: Family Medicine | Admitting: Family Medicine

## 2015-02-22 DIAGNOSIS — B9789 Other viral agents as the cause of diseases classified elsewhere: Principal | ICD-10-CM

## 2015-02-22 DIAGNOSIS — J069 Acute upper respiratory infection, unspecified: Secondary | ICD-10-CM | POA: Diagnosis not present

## 2015-02-22 DIAGNOSIS — J9801 Acute bronchospasm: Secondary | ICD-10-CM

## 2015-02-22 LAB — POCT CBC W AUTO DIFF (K'VILLE URGENT CARE)

## 2015-02-22 MED ORDER — BENZONATATE 200 MG PO CAPS: 200.0000 mg | ORAL_CAPSULE | Freq: Every day | ORAL | Status: DC

## 2015-02-22 MED ORDER — ALBUTEROL SULFATE HFA 108 (90 BASE) MCG/ACT IN AERS: 2.0000 | INHALATION_SPRAY | RESPIRATORY_TRACT | Status: DC | PRN

## 2015-02-22 MED ORDER — CEFDINIR 300 MG PO CAPS: 300.0000 mg | ORAL_CAPSULE | Freq: Two times a day (BID) | ORAL | Status: DC

## 2015-02-22 MED ORDER — PREDNISONE 20 MG PO TABS: 20.0000 mg | ORAL_TABLET | Freq: Two times a day (BID) | ORAL | Status: DC

## 2015-02-22 NOTE — Discharge Instructions (Signed)
Take plain guaifenesin (1200mg extended release tabs such as Mucinex) twice daily, with plenty of water, for cough and congestion.  Get adequate rest.   °May use Afrin nasal spray (or generic oxymetazoline) twice daily for about 5 days and then discontinue.  Also recommend using saline nasal spray several times daily and saline nasal irrigation (AYR is a common brand).  Use Flonase nasal spray each morning after using Afrin nasal spray and saline nasal irrigation. °Try warm salt water gargles for sore throat.  °Stop all antihistamines for now, and other non-prescription cough/cold preparations. °  °  °  °

## 2015-02-22 NOTE — ED Notes (Signed)
Pt c/o productive cough, SOB, and fever x 3 days. She was recently hospitalized for bronchospasms. Temp was 100.8 at 1830. She quit smoking yesterday. Took Mucinex today.

## 2015-02-22 NOTE — ED Provider Notes (Signed)
CSN: [id number]     Arrival date & time 02/22/15  1922 History   First MD Initiated Contact with Patient 02/22/15 1940     Chief Complaint  Patient presents with  . Cough      HPI Comments: Patient complains of three day history of typical cold-like symptoms including mild sore throat, sinus congestion, fatigue, and cough.  She has now developed increased cough with wheezing and shortness of breath with activity.  Today she had chills/sweats and fever to 100.  She has developed pleuritic pain over her left posterior chest. Three weeks ago she developed similar URI symptoms leading to wheezing and shortness of breath.  She was hospitalized for four days for antibiotic treatment, with resolution of most of her symptoms at discharge. She has a past history of pneumonia in December 2015.  Her son has asthma.  She states that she has just quit smoking.  The history is provided by the patient.    Past Medical History  Diagnosis Date  . Kidney stone   . Anxiety   . Bipolar affective disorder (HCC)   . SBO (small bowel obstruction) [facility])    Past Surgical History  Procedure Laterality Date  . Cesarean section    . Gastric bypass    . Uterine ablasion    . Tubal ligation     Family History  Problem Relation Age of Onset  . Diabetes Mother   . Cancer Mother    Social History  Substance Use Topics  . Smoking status: Current Every Day Smoker -- 0.25 packs/day    Last Attempt to Quit: 05/11/2014  . Smokeless tobacco: Never Used  . Alcohol Use: No   OB History    No data available     Review of Systems + sore throat + cough + pleuritic pain + wheezing + nasal congestion + post-nasal drainage + sinus pain/pressure No itchy/red eyes No earache No hemoptysis + SOB + fever, + chills No nausea No vomiting No abdominal pain No diarrhea No urinary symptoms No skin rash + fatigue + myalgias No headache Used OTC meds without relief  Allergies  Nsaids  Home Medications    Prior to Admission medications   Medication Sig Start Date End Date Taking? Authorizing Provider  albuterol (PROVENTIL HFA;VENTOLIN HFA) 108 (90 BASE) MCG/ACT inhaler Inhale 2 puffs into the lungs every 4 (four) hours as needed for wheezing or shortness of breath. 02/22/15   Lattie Haw, MD  amitriptyline (ELAVIL) 25 MG tablet Take 25 mg by mouth 3 (three) times daily.    Historical Provider, MD  benzonatate (TESSALON) 200 MG capsule Take 1 capsule (200 mg total) by mouth at bedtime. Take as needed for cough 02/22/15   Lattie Haw, MD  cefdinir (OMNICEF) 300 MG capsule Take 1 capsule (300 mg total) by mouth 2 (two) times daily. 02/22/15   Lattie Haw, MD  diazepam (VALIUM) 10 MG tablet Take 10 mg by mouth 3 (three) times daily.    Historical Provider, MD  doxazosin (CARDURA) 1 MG tablet Take 1 mg by mouth daily. 12/30/14   Historical Provider, MD  FLUoxetine (PROZAC) 20 MG tablet Take 60 mg by mouth daily.     Historical Provider, MD  Oxcarbazepine (TRILEPTAL) 300 MG tablet Take 300 mg by mouth 3 (three) times daily.    Historical Provider, MD  predniSONE (DELTASONE) 20 MG tablet Take 1 tablet (20 mg total) by mouth 2 (two) times daily. Take with food. 02/22/15  Lattie Haw, MD  QUEtiapine (SEROQUEL) 200 MG tablet Take 200 mg by mouth at bedtime.    Historical Provider, MD  trifluoperazine (STELAZINE) 2 MG tablet Take 2 mg by mouth 2 (two) times daily.    Historical Provider, MD  valACYclovir (VALTREX) 1000 MG tablet Take 1,000 mg by mouth daily.    Historical Provider, MD  zolpidem (AMBIEN) 10 MG tablet Take 10 mg by mouth at bedtime.     Historical Provider, MD   Meds Ordered and Administered this Visit  Medications - No data to display  BP 130/89 mmHg  Pulse 95  Temp(Src) 99 F (37.2 C) (Oral)  Resp 20  Ht  (1.626 m)  Wt 275 lb (124.739 kg)  BMI 47.18 kg/m2  SpO2 94% No data found.   Physical Exam Nursing notes and Vital Signs reviewed. Appearance:   Patient appears stated age, and in no acute distress.  Patient is obese (BMI 47.2) Eyes:  Pupils are equal, round, and reactive to light and accomodation.  Extraocular movement is intact.  Conjunctivae are not inflamed  Ears:  Canals normal.  Tympanic membranes normal.  Nose:  Congested turbinates.   Mild maxillary sinus tenderness is present.  Pharynx:  Normal Neck:  Supple.  Tender enlarged posterior nodes are palpated bilaterally  Lungs:   Scattered expiratory wheezes and rhonchi posteriorly.  Breath sounds are equal.  Moving air well. Chest:  Distinct tenderness to palpation over the mid-sternum.  Heart:  Regular rate and rhythm without murmurs, rubs, or gallops.  Abdomen:  Nontender without masses or hepatosplenomegaly.  Bowel sounds are present.  No CVA or flank tenderness.  Extremities:  No edema.  No calf tenderness Skin:  No rash present.   ED Course  Procedures  None    Labs Reviewed  POCT CBC W AUTO DIFF ([facility]):  WBC 10.3; LY 23.3; MO 3.3; GR 73.4; Hgb 12.8; Platelets 286       MDM   1. Viral URI with cough   2. Bronchospasm    With recent history of bronchitis and bronchospasm requiring hospitalization, will begin antibiotic coverage with Omnicef  BID.  Begin prednisone burst.  Rx for albuterol MDI.  Prescription written for Benzonatate [facility]) to take at bedtime for night-time cough.  Take plain guaifenesin (  extended release tabs such as Mucinex) twice daily, with plenty of water, for cough and congestion.  Get adequate rest.   May use Afrin nasal spray (or generic oxymetazoline) twice daily for about 5 days and then discontinue.  Also recommend using saline nasal spray several times daily and saline nasal irrigation (AYR is a common brand).  Use Flonase nasal spray each morning after using Afrin nasal spray and saline nasal irrigation. Try warm salt water gargles for sore throat.  Stop all antihistamines for now, and other non-prescription  cough/cold preparations. Return for follow-up one to two days. If symptoms become significantly worse during the night or over the weekend, proceed to the local emergency room.     Lattie Haw, MD 02/23/15 [phone]

## 2015-02-24 ENCOUNTER — Emergency Department (HOSPITAL_BASED_OUTPATIENT_CLINIC_OR_DEPARTMENT_OTHER): Payer: 59

## 2015-02-24 ENCOUNTER — Encounter (HOSPITAL_BASED_OUTPATIENT_CLINIC_OR_DEPARTMENT_OTHER): Payer: Self-pay | Admitting: Emergency Medicine

## 2015-02-24 ENCOUNTER — Inpatient Hospital Stay (HOSPITAL_BASED_OUTPATIENT_CLINIC_OR_DEPARTMENT_OTHER)
Admission: EM | Admit: 2015-02-24 | Discharge: 2015-02-28 | DRG: 194 | Disposition: A | Discharge status: Home / Self Care | Payer: 59 | Attending: Internal Medicine | Admitting: Internal Medicine

## 2015-02-24 ENCOUNTER — Emergency Department
Admission: EM | Admit: 2015-02-24 | Discharge: 2015-02-24 | Disposition: A | Discharge status: Other Acute Inpatient Hospital | Payer: 59 | Source: Home / Self Care | Attending: Family Medicine | Admitting: Family Medicine

## 2015-02-24 ENCOUNTER — Encounter: Payer: Self-pay | Admitting: *Deleted

## 2015-02-24 DIAGNOSIS — F319 Bipolar disorder, unspecified: Secondary | ICD-10-CM | POA: Diagnosis present

## 2015-02-24 DIAGNOSIS — F411 Generalized anxiety disorder: Secondary | ICD-10-CM | POA: Diagnosis present

## 2015-02-24 DIAGNOSIS — Z833 Family history of diabetes mellitus: Secondary | ICD-10-CM | POA: Diagnosis not present

## 2015-02-24 DIAGNOSIS — J189 Pneumonia, unspecified organism: Secondary | ICD-10-CM | POA: Diagnosis present

## 2015-02-24 DIAGNOSIS — F3170 Bipolar disorder, currently in remission, most recent episode unspecified: Secondary | ICD-10-CM

## 2015-02-24 DIAGNOSIS — Y95 Nosocomial condition: Secondary | ICD-10-CM | POA: Diagnosis present

## 2015-02-24 DIAGNOSIS — Z809 Family history of malignant neoplasm, unspecified: Secondary | ICD-10-CM | POA: Diagnosis not present

## 2015-02-24 DIAGNOSIS — E669 Obesity, unspecified: Secondary | ICD-10-CM | POA: Diagnosis present

## 2015-02-24 DIAGNOSIS — Z87442 Personal history of urinary calculi: Secondary | ICD-10-CM | POA: Diagnosis not present

## 2015-02-24 DIAGNOSIS — R0602 Shortness of breath: Secondary | ICD-10-CM | POA: Diagnosis not present

## 2015-02-24 DIAGNOSIS — Z9884 Bariatric surgery status: Secondary | ICD-10-CM

## 2015-02-24 DIAGNOSIS — Z6841 Body Mass Index (BMI) 40.0 and over, adult: Secondary | ICD-10-CM

## 2015-02-24 DIAGNOSIS — R0902 Hypoxemia: Secondary | ICD-10-CM

## 2015-02-24 DIAGNOSIS — Z79899 Other long term (current) drug therapy: Secondary | ICD-10-CM | POA: Diagnosis not present

## 2015-02-24 DIAGNOSIS — F17200 Nicotine dependence, unspecified, uncomplicated: Secondary | ICD-10-CM | POA: Diagnosis present

## 2015-02-24 DIAGNOSIS — J9801 Acute bronchospasm: Secondary | ICD-10-CM | POA: Diagnosis present

## 2015-02-24 DIAGNOSIS — F3113 Bipolar disorder, current episode manic without psychotic features, severe: Secondary | ICD-10-CM | POA: Diagnosis not present

## 2015-02-24 DIAGNOSIS — J069 Acute upper respiratory infection, unspecified: Secondary | ICD-10-CM

## 2015-02-24 DIAGNOSIS — J45901 Unspecified asthma with (acute) exacerbation: Secondary | ICD-10-CM | POA: Diagnosis present

## 2015-02-24 LAB — COMPREHENSIVE METABOLIC PANEL
ALBUMIN: 3.6 g/dL (ref 3.5–5.0)
ALT: 13 U/L — AB (ref 14–54)
AST: 20 U/L (ref 15–41)
Alkaline Phosphatase: 71 U/L (ref 38–126)
Anion gap: 8 (ref 5–15)
BILIRUBIN TOTAL: 0.2 mg/dL — AB (ref 0.3–1.2)
BUN: 11 mg/dL (ref 6–20)
CHLORIDE: 106 mmol/L (ref 101–111)
CO2: 23 mmol/L (ref 22–32)
CREATININE: 0.53 mg/dL (ref 0.44–1.00)
Calcium: 9 mg/dL (ref 8.9–10.3)
GFR calc Af Amer: >60 mL/min (ref >60)
GFR calc non Af Amer: >60 mL/min (ref >60)
GLUCOSE: 137 mg/dL — AB (ref 65–99)
POTASSIUM: 3.7 mmol/L (ref 3.5–5.1)
Sodium: 137 mmol/L (ref 135–145)
Total Protein: 7.1 g/dL (ref 6.5–8.1)

## 2015-02-24 LAB — CBC WITH DIFFERENTIAL/PLATELET
BASOS ABS: 0 10*3/uL (ref 0.0–0.1)
BASOS PCT: 0 %
Eosinophils Absolute: 0 10*3/uL (ref 0.0–0.7)
Eosinophils Relative: 0 %
HEMATOCRIT: 39.1 % (ref 36.0–46.0)
Hemoglobin: 13.2 g/dL (ref 12.0–15.0)
LYMPHS ABS: 1.2 10*3/uL (ref 0.7–4.0)
LYMPHS PCT: 8 %
MCH: 31.1 pg (ref 26.0–34.0)
MCHC: 33.8 g/dL (ref 30.0–36.0)
MCV: 92 fL (ref 78.0–100.0)
MONO ABS: 0.7 10*3/uL (ref 0.1–1.0)
Monocytes Relative: 5 %
NEUTROS ABS: 12.7 10*3/uL — AB (ref 1.7–7.7)
Neutrophils Relative %: 87 %
PLATELETS: 286 10*3/uL (ref 150–400)
RBC: 4.25 MIL/uL (ref 3.87–5.11)
RDW: 12.4 % (ref 11.5–15.5)
WBC: 14.5 10*3/uL — ABNORMAL HIGH (ref 4.0–10.5)

## 2015-02-24 LAB — I-STAT CG4 LACTIC ACID, ED: Lactic Acid, Venous: 2.53 mmol/L (ref 0.5–2.0)

## 2015-02-24 LAB — GLUCOSE, CAPILLARY: Glucose-Capillary: 92 mg/dL (ref 65–99)

## 2015-02-24 LAB — TROPONIN I: Troponin I: <0.03 ng/mL (ref <0.031)

## 2015-02-24 MED ORDER — VANCOMYCIN HCL IN DEXTROSE 1-5 GM/200ML-% IV SOLN
1000.0000 mg | Freq: Three times a day (TID) | INTRAVENOUS | Status: DC
  Administered 2015-02-25 – 2015-02-26 (×4): 1000 mg via INTRAVENOUS
  Filled 2015-02-24 (×5): qty 200

## 2015-02-24 MED ORDER — IPRATROPIUM-ALBUTEROL 0.5-2.5 (3) MG/3ML IN SOLN
3.0000 mL | Freq: Four times a day (QID) | RESPIRATORY_TRACT | Status: DC
  Administered 2015-02-25 (×4): 3 mL via RESPIRATORY_TRACT
  Filled 2015-02-24 (×4): qty 3

## 2015-02-24 MED ORDER — SODIUM CHLORIDE 0.9 % IV SOLN: INTRAVENOUS | Status: DC

## 2015-02-24 MED ORDER — SODIUM CHLORIDE 0.9 % IV BOLUS (SEPSIS)
1000.0000 mL | Freq: Once | INTRAVENOUS | Status: AC
  Administered 2015-02-24: 1000 mL via INTRAVENOUS

## 2015-02-24 MED ORDER — METHYLPREDNISOLONE SODIUM SUCC 125 MG IJ SOLR
60.0000 mg | Freq: Four times a day (QID) | INTRAMUSCULAR | Status: DC
  Administered 2015-02-24 – 2015-02-26 (×6): 60 mg via INTRAVENOUS
  Filled 2015-02-24 (×10): qty 0.96

## 2015-02-24 MED ORDER — QUETIAPINE FUMARATE 200 MG PO TABS
200.0000 mg | ORAL_TABLET | Freq: Every day | ORAL | Status: DC
  Administered 2015-02-24 – 2015-02-27 (×4): 200 mg via ORAL
  Filled 2015-02-24 (×5): qty 1

## 2015-02-24 MED ORDER — SODIUM CHLORIDE 0.9 % IV SOLN
Freq: Once | INTRAVENOUS | Status: AC
  Administered 2015-02-24: 18:00:00 via INTRAVENOUS

## 2015-02-24 MED ORDER — ALBUTEROL SULFATE (2.5 MG/3ML) 0.083% IN NEBU
INHALATION_SOLUTION | RESPIRATORY_TRACT | Status: AC
  Administered 2015-02-24: 5 mg
  Filled 2015-02-24: qty 6

## 2015-02-24 MED ORDER — VANCOMYCIN HCL IN DEXTROSE 1-5 GM/200ML-% IV SOLN
1000.0000 mg | Freq: Once | INTRAVENOUS | Status: AC
  Administered 2015-02-24: 1000 mg via INTRAVENOUS
  Filled 2015-02-24: qty 200

## 2015-02-24 MED ORDER — DOXAZOSIN MESYLATE 1 MG PO TABS
1.0000 mg | ORAL_TABLET | Freq: Every day | ORAL | Status: DC
Start: 2015-02-25 — End: 2015-02-28
  Administered 2015-02-25 – 2015-02-28 (×4): 1 mg via ORAL
  Filled 2015-02-24 (×4): qty 1

## 2015-02-24 MED ORDER — PIPERACILLIN-TAZOBACTAM 3.375 G IVPB 30 MIN
3.3750 g | Freq: Once | INTRAVENOUS | Status: AC
  Administered 2015-02-24: 3.375 g via INTRAVENOUS
  Filled 2015-02-24 (×2): qty 50

## 2015-02-24 MED ORDER — GUAIFENESIN ER 600 MG PO TB12
600.0000 mg | ORAL_TABLET | Freq: Two times a day (BID) | ORAL | Status: DC
  Administered 2015-02-24 – 2015-02-28 (×8): 600 mg via ORAL
  Filled 2015-02-24 (×11): qty 1

## 2015-02-24 MED ORDER — PIPERACILLIN-TAZOBACTAM 3.375 G IVPB
3.3750 g | Freq: Three times a day (TID) | INTRAVENOUS | Status: DC
  Administered 2015-02-24 – 2015-02-26 (×5): 3.375 g via INTRAVENOUS
  Filled 2015-02-24 (×7): qty 50

## 2015-02-24 MED ORDER — IPRATROPIUM-ALBUTEROL 0.5-2.5 (3) MG/3ML IN SOLN
3.0000 mL | Freq: Four times a day (QID) | RESPIRATORY_TRACT | Status: DC
  Administered 2015-02-24 (×2): 3 mL via RESPIRATORY_TRACT
  Filled 2015-02-24 (×2): qty 3

## 2015-02-24 MED ORDER — HYDROCODONE-ACETAMINOPHEN 7.5-325 MG PO TABS
1.0000 | ORAL_TABLET | Freq: Four times a day (QID) | ORAL | Status: DC | PRN
  Administered 2015-02-25: 1 via ORAL
  Filled 2015-02-24: qty 1

## 2015-02-24 MED ORDER — TRIFLUOPERAZINE HCL 2 MG PO TABS
2.0000 mg | ORAL_TABLET | Freq: Two times a day (BID) | ORAL | Status: DC
  Administered 2015-02-24 – 2015-02-28 (×8): 2 mg via ORAL
  Filled 2015-02-24 (×10): qty 1

## 2015-02-24 MED ORDER — AMITRIPTYLINE HCL 25 MG PO TABS
25.0000 mg | ORAL_TABLET | Freq: Three times a day (TID) | ORAL | Status: DC
  Administered 2015-02-24 – 2015-02-28 (×11): 25 mg via ORAL
  Filled 2015-02-24 (×16): qty 1

## 2015-02-24 MED ORDER — SODIUM CHLORIDE 0.9 % IV BOLUS (SEPSIS): 1000.0000 mL | Freq: Once | INTRAVENOUS | Status: DC

## 2015-02-24 MED ORDER — SODIUM CHLORIDE 0.9 % IV SOLN
INTRAVENOUS | Status: DC
  Administered 2015-02-24 – 2015-02-27 (×4): via INTRAVENOUS

## 2015-02-24 MED ORDER — FLUOXETINE HCL 20 MG PO CAPS
60.0000 mg | ORAL_CAPSULE | Freq: Every day | ORAL | Status: DC
  Administered 2015-02-25 – 2015-02-28 (×4): 60 mg via ORAL
  Filled 2015-02-24 (×4): qty 3

## 2015-02-24 MED ORDER — OXCARBAZEPINE 300 MG PO TABS
300.0000 mg | ORAL_TABLET | Freq: Three times a day (TID) | ORAL | Status: DC
  Administered 2015-02-24 – 2015-02-28 (×11): 300 mg via ORAL
  Filled 2015-02-24 (×13): qty 1

## 2015-02-24 MED ORDER — ENOXAPARIN SODIUM 40 MG/0.4ML ~~LOC~~ SOLN
40.0000 mg | SUBCUTANEOUS | Status: DC
  Administered 2015-02-24 – 2015-02-25 (×2): 40 mg via SUBCUTANEOUS
  Filled 2015-02-24 (×3): qty 0.4

## 2015-02-24 MED ORDER — ZOLPIDEM TARTRATE 5 MG PO TABS
5.0000 mg | ORAL_TABLET | Freq: Every day | ORAL | Status: DC
  Administered 2015-02-24 – 2015-02-27 (×4): 5 mg via ORAL
  Filled 2015-02-24 (×4): qty 1

## 2015-02-24 MED ORDER — MORPHINE SULFATE (PF) 4 MG/ML IV SOLN
4.0000 mg | Freq: Once | INTRAVENOUS | Status: AC
  Administered 2015-02-24: 4 mg via INTRAVENOUS
  Filled 2015-02-24: qty 1

## 2015-02-24 MED ORDER — DIAZEPAM 5 MG PO TABS
10.0000 mg | ORAL_TABLET | Freq: Three times a day (TID) | ORAL | Status: DC
  Administered 2015-02-24 – 2015-02-28 (×11): 10 mg via ORAL
  Filled 2015-02-24 (×11): qty 2

## 2015-02-24 MED ORDER — VALACYCLOVIR HCL 500 MG PO TABS
1000.0000 mg | ORAL_TABLET | Freq: Every day | ORAL | Status: DC
  Administered 2015-02-25 – 2015-02-28 (×4): 1000 mg via ORAL
  Filled 2015-02-24 (×4): qty 2

## 2015-02-24 NOTE — ED Notes (Signed)
Patient asked to change into a gown from waist up. 

## 2015-02-24 NOTE — Progress Notes (Signed)
[facility].  Medical floor  [facility] [phone]

## 2015-02-24 NOTE — ED Notes (Signed)
Pt is here for f/u from visit on 02/22/15. She has been taking Omnicef and prednisone as prescribed. She is feeling somewhat improved but still c/o non-productive cough and SOB.

## 2015-02-24 NOTE — ED Provider Notes (Signed)
CSN: [id number]     Arrival date & time 02/24/15  1312 History   First MD Initiated Contact with Patient 02/24/15 1502     Chief Complaint  Patient presents with  . Shortness of Breath     (Consider location/radiation/quality/duration/timing/severity/associated sxs/prior Treatment) The history is provided by the patient.  Ashley Franklin is a 44 y.o. female hx of kidney stone, bipolar, here with shortness of breath, hypoxia. Patient states that she has been having productive cough for the last several days. Also has subjective fever and chills. [facility] and was prescribed Omnicef, albuterol, prednisone. She will back today because she has worsening shortness of breath and cough. In the urgent care, patient was noted to be hypoxic to about 86%. She sent here for hypoxia. Patient [facility]. [place] moved [place] previous to that. She is a smoker but was unable to spoke for the last several days. Had her flu shot this year. She has been followed up with the sickle cell clinic.    Past Medical History  Diagnosis Date  . Kidney stone   . Anxiety   . Bipolar affective disorder (HCC)   . SBO (small bowel obstruction) [facility](HCC)    Past Surgical History  Procedure Laterality Date  . Cesarean section    . Gastric bypass    . Uterine ablasion    . Tubal ligation     Family History  Problem Relation Age of Onset  . Diabetes Mother   . Cancer Mother    Social History  Substance Use Topics  . Smoking status: Current Every Day Smoker -- 0.25 packs/day    Last Attempt to Quit: 05/11/2014  . Smokeless tobacco: Never Used  . Alcohol Use: No   OB History    No data available     Review of Systems  Respiratory: Positive for shortness of breath.   All other systems reviewed and are negative.     Allergies  Nsaids  Home Medications   Prior to Admission medications   Medication Sig Start  Date End Date Taking? Authorizing Provider  albuterol (PROVENTIL HFA;VENTOLIN HFA) 108 (90 BASE) MCG/ACT inhaler Inhale 2 puffs into the lungs every 4 (four) hours as needed for wheezing or shortness of breath. 02/22/15   Lattie HawStephen A Beese, MD  amitriptyline (ELAVIL) 25 MG tablet Take 25 mg by mouth 3 (three) times daily.    Historical Provider, MD  benzonatate (TESSALON) 200 MG capsule Take 1 capsule (200 mg total) by mouth at bedtime. Take as needed for cough 02/22/15   Lattie HawStephen A Beese, MD  cefdinir (OMNICEF) 300 MG capsule Take 1 capsule (300 mg total) by mouth 2 (two) times daily. 02/22/15   Lattie HawStephen A Beese, MD  diazepam (VALIUM) 10 MG tablet Take 10 mg by mouth 3 (three) times daily.    Historical Provider, MD  doxazosin (CARDURA) 1 MG tablet Take 1 mg by mouth daily. 12/30/14   Historical Provider, MD  FLUoxetine (PROZAC) 20 MG tablet Take 60 mg by mouth daily.     Historical Provider, MD  Oxcarbazepine (TRILEPTAL) 300 MG tablet Take 300 mg by mouth 3 (three) times daily.    Historical Provider, MD  predniSONE (DELTASONE) 20 MG tablet Take 1 tablet (20 mg total) by mouth 2 (two) times daily. Take with food. 02/22/15   Lattie HawStephen A Beese, MD  QUEtiapine (SEROQUEL) 200 MG tablet Take 200 mg by  mouth at bedtime.    Historical Provider, MD  trifluoperazine (STELAZINE) 2 MG tablet Take 2 mg by mouth 2 (two) times daily.    Historical Provider, MD  valACYclovir (VALTREX) 1000 MG tablet Take 1,000 mg by mouth daily.    Historical Provider, MD  zolpidem (AMBIEN) 10 MG tablet Take 10 mg by mouth at bedtime.     Historical Provider, MD   BP 118/49 mmHg  Pulse 88  Temp(Src) 97.7 F (36.5 C) (Oral)  Resp 18  Ht  (1.626 m)  Wt 250 lb (113.399 kg)  BMI 42.89 kg/m2  SpO2 96% Physical Exam  Constitutional: She is oriented to person, place, and time.  Tachypneic, short of breath   HENT:  Head: Normocephalic.  Mouth/Throat: Oropharynx is clear and moist.  Eyes: Conjunctivae are normal. Pupils are  equal, round, and reactive to light.  Neck: Normal range of motion. Neck supple.  Cardiovascular: Normal rate, regular rhythm and normal heart sounds.   Pulmonary/Chest:  Tachypneic, wheezing throughout, worse in the upper lobes   Abdominal: Soft. Bowel sounds are normal. She exhibits no distension. There is no tenderness. There is no rebound.  Musculoskeletal: Normal range of motion.  Neurological: She is alert and oriented to person, place, and time.  Skin: Skin is warm.  Psychiatric: She has a normal mood and affect. Her behavior is normal. Judgment and thought content normal.  Nursing note and vitals reviewed.   ED Course  Procedures (including critical care time) Labs Review Labs Reviewed  CBC WITH DIFFERENTIAL/PLATELET - Abnormal; Notable for the following:    WBC 14.5 (*)    Neutro Abs 12.7 (*)    All other components within normal limits  COMPREHENSIVE METABOLIC PANEL - Abnormal; Notable for the following:    Glucose, Bld 137 (*)    ALT 13 (*)    Total Bilirubin 0.2 (*)    All other components within normal limits  I-STAT CG4 LACTIC ACID, ED - Abnormal; Notable for the following:    Lactic Acid, Venous 2.53 (*)    All other components within normal limits  CULTURE, BLOOD (ROUTINE X 2)  CULTURE, BLOOD (ROUTINE X 2)  TROPONIN I    Imaging Review Dg Chest 2 View  02/24/2015  CLINICAL DATA:  Cough, shortness of breath, and hypoxia for 6 days. EXAM: CHEST  2 VIEW COMPARISON:  01/01/2015 FINDINGS: The heart size and mediastinal contours are within normal limits. New mild airspace disease is seen in the right upper lobe, suspicious for pneumonia. There is also new linear opacity in the left upper lobe, consistent with atelectasis. No evidence of pleural effusion. IMPRESSION: Right upper lobe airspace disease, consistent with pneumonia. Mild subsegmental atelectasis also noted in left upper lobe. Electronically Signed   By: Myles Rosenthal M.D.   On: 02/24/2015 13:37   I have  personally reviewed and evaluated these images and lab results as part of my medical decision-making.   EKG Interpretation   Date/Time:  Wednesday February 24 2015 15:48:45 EDT Ventricular Rate:  95 PR Interval:  152 QRS Duration: 94 QT Interval:  358 QTC Calculation: 449 R Axis:   61 Text Interpretation:  Normal sinus rhythm Nonspecific T wave abnormality  Abnormal ECG No significant change since last tracing Confirmed by YAO   MD, DAVID ([id number]) on 02/24/2015 3:48:10 PM      MDM   Final diagnoses:  None    Ashley Franklin is a 44 y.o. female here with chills, shortness of breath,  hypoxia. Likely COPD vs pneumonia. No recent travel and I doubt PE. Will do sepsis workup, CXR. Will give nebs and reassess.  4:15 PM Still hypoxic despite 2 nebs and requires oxygen. CXR showed RUL pneumonia. WBC 14, lactate 2.5. Since she was recently admitted, will cover for HCAP. Will admit.      Richardean Canal, MD 02/24/15 [phone]

## 2015-02-24 NOTE — ED Notes (Signed)
MD at bedside. 

## 2015-02-24 NOTE — ED Provider Notes (Signed)
CSN: [id number]     Arrival date & time 02/24/15  1216 History   None    Chief Complaint  Patient presents with  . Follow-up   (Consider location/radiation/quality/duration/timing/severity/associated sxs/prior Treatment) HPI  The pt is a 44yo female [facility] of symptoms associated with a URI she is currently being treated for.  Pt was seen 2 days ago for cough, congestion, fever, and chills.  She was started on albuterol inhaler, tessalon, Omnicef, and given prednisone.Pt does not chest tightness today and mild SOB but feels better overall compared to 2 days ago.  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  Pt was admitted for bronchospasm just 2 months ago for 5 days and had similar symptoms at that time.  Pt states she does not have any dx asthma or COPD but does report hx of smoking.  She has tried to stop several times.  Denies sick contacts or recent travel.   Past Medical History  Diagnosis Date  . Kidney stone   . Anxiety   . Bipolar affective disorder (HCC)   . SBO (small bowel obstruction) [facility](HCC)    Past Surgical History  Procedure Laterality Date  . Cesarean section    . Gastric bypass    . Uterine ablasion    . Tubal ligation     Family History  Problem Relation Age of Onset  . Diabetes Mother   . Cancer Mother    Social History  Substance Use Topics  . Smoking status: Current Every Day Smoker -- 0.25 packs/day    Last Attempt to Quit: 05/11/2014  . Smokeless tobacco: Never Used  . Alcohol Use: No   OB History    No data available     Review of Systems  Constitutional: Negative for fever and chills.  HENT: Positive for congestion. Negative for ear pain, sore throat, trouble swallowing and voice change.   Respiratory: Positive for cough. Negative for shortness of breath.   Cardiovascular: Negative for chest pain and palpitations.   Gastrointestinal: Negative for nausea, vomiting, abdominal pain and diarrhea.  Musculoskeletal: Negative for myalgias, back pain and arthralgias.  Skin: Negative for rash.  All other systems reviewed and are negative.   Allergies  Nsaids  Home Medications   Prior to Admission medications   Medication Sig Start Date End Date Taking? Authorizing Provider  albuterol (PROVENTIL HFA;VENTOLIN HFA) 108 (90 BASE) MCG/ACT inhaler Inhale 2 puffs into the lungs every 4 (four) hours as needed for wheezing or shortness of breath. 02/22/15   Lattie HawStephen A Beese, MD  amitriptyline (ELAVIL) 25 MG tablet Take 25 mg by mouth 3 (three) times daily.    Historical Provider, MD  benzonatate (TESSALON) 200 MG capsule Take 1 capsule (200 mg total) by mouth at bedtime. Take as needed for cough 02/22/15   Lattie HawStephen A Beese, MD  cefdinir (OMNICEF) 300 MG capsule Take 1 capsule (300 mg total) by mouth 2 (two) times daily. 02/22/15   Lattie HawStephen A Beese, MD  diazepam (VALIUM) 10 MG tablet Take 10 mg by mouth 3 (three) times daily.    Historical Provider, MD  doxazosin (CARDURA) 1 MG tablet Take 1 mg by mouth daily. 12/30/14   Historical Provider, MD  FLUoxetine (PROZAC) 20 MG tablet Take 60 mg by mouth daily.     Historical Provider, MD  Oxcarbazepine (TRILEPTAL) 300 MG tablet  Take 300 mg by mouth 3 (three) times daily.    Historical Provider, MD  predniSONE (DELTASONE) 20 MG tablet Take 1 tablet (20 mg total) by mouth 2 (two) times daily. Take with food. 02/22/15   Lattie Haw, MD  QUEtiapine (SEROQUEL) 200 MG tablet Take 200 mg by mouth at bedtime.    Historical Provider, MD  trifluoperazine (STELAZINE) 2 MG tablet Take 2 mg by mouth 2 (two) times daily.    Historical Provider, MD  valACYclovir (VALTREX) 1000 MG tablet Take 1,000 mg by mouth daily.    Historical Provider, MD  zolpidem (AMBIEN) 10 MG tablet Take 10 mg by mouth at bedtime.     Historical Provider, MD   Meds Ordered and Administered this Visit  Medications  - No data to display  BP 117/82 mmHg  Pulse 93  Temp(Src) 98.3 F (36.8 C) (Oral)  Resp 20  SpO2 91% No data found.   Physical Exam  Constitutional: She appears well-developed and well-nourished. No distress.  HENT:  Head: Normocephalic and atraumatic.  Right Ear: Hearing, tympanic membrane, external ear and ear canal normal.  Left Ear: Hearing, tympanic membrane, external ear and ear canal normal.  Nose: Nose normal.  Mouth/Throat: Uvula is midline, oropharynx is clear and moist and mucous membranes are normal.  Eyes: Conjunctivae are normal. No scleral icterus.  Neck: Normal range of motion.  Cardiovascular: Normal rate, regular rhythm and normal heart sounds.   Pulmonary/Chest: Effort normal. No respiratory distress. She has wheezes. She has rales. She exhibits no tenderness.  No respiratory distress. Able to speak in full sentences without difficulty.  Lungs: diffuse wheeze and rhonchi with decreased breath sounds in lower lung fields.  Abdominal: Soft. She exhibits no distension and no mass. There is no tenderness. There is no rebound and no guarding.  Musculoskeletal: Normal range of motion.  Neurological: She is alert.  Skin: Skin is warm and dry. She is not diaphoretic.  Nursing note and vitals reviewed.   ED Course  Procedures (including critical care time)  Labs Review Labs Reviewed - No data to display  Imaging Review No results found.   MDM   1. Bronchospasm   2. 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   Vitals: O2 Sat 91% in triage, decreased to 89% on Dynamap.  Pt does report mild chest tightness and SOB. Hx of admission for similar symptoms 2 months ago.  Discussed use of Albuterol tx in UC or to go to emergency department for hour long nebulizer treatments. Pt agreed she will  benefit most from longer nebulizer treatment in emergency department. Advised pt she may need to be admitted if O2 Sat does not improve.  Pt states she wants to start at [facility] as she is hopeful she will not be admitted so she can return to work sooner. Pt stable for discharge and able to drive herself to emergency department for additional care and further evaluation of hypoxia likely due to underlying lung disease from years of smoking. Pt verbalized understanding and agreement with tx plan.   Junius Finner, PA-C 02/24/15 1306

## 2015-02-24 NOTE — ED Notes (Signed)
[facility] at bedside for transport. 

## 2015-02-24 NOTE — ED Notes (Signed)
Dr Deretha EmoryZackowski notified of pt's O2 sats 88%. RT at bedside to assess pt

## 2015-02-24 NOTE — ED Notes (Signed)
Patient states that she went to [facility] [place] and was found to have low o2 sats. The patient was referred her to have hour long nebulizer treatment and possible admission

## 2015-02-24 NOTE — ED Notes (Signed)
Pt placed on 2lpm of oxygen due to room air Sp02 of 96%.

## 2015-02-25 DIAGNOSIS — J9801 Acute bronchospasm: Secondary | ICD-10-CM | POA: Diagnosis present

## 2015-02-25 DIAGNOSIS — F3113 Bipolar disorder, current episode manic without psychotic features, severe: Secondary | ICD-10-CM

## 2015-02-25 LAB — EXPECTORATED SPUTUM ASSESSMENT W REFEX TO RESP CULTURE

## 2015-02-25 LAB — GLUCOSE, CAPILLARY
GLUCOSE-CAPILLARY: 388 mg/dL — AB (ref 65–99)
GLUCOSE-CAPILLARY: 323 mg/dL — AB (ref 65–99)
GLUCOSE-CAPILLARY: 293 mg/dL — AB (ref 65–99)
Glucose-Capillary: 369 mg/dL — ABNORMAL HIGH (ref 65–99)

## 2015-02-25 LAB — COMPREHENSIVE METABOLIC PANEL
ALBUMIN: 3.4 g/dL — AB (ref 3.5–5.0)
ALT: 13 U/L — AB (ref 14–54)
ANION GAP: 8 (ref 5–15)
AST: 16 U/L (ref 15–41)
Alkaline Phosphatase: 70 U/L (ref 38–126)
BUN: 9 mg/dL (ref 6–20)
CALCIUM: 8.3 mg/dL — AB (ref 8.9–10.3)
CHLORIDE: 104 mmol/L (ref 101–111)
CO2: 23 mmol/L (ref 22–32)
Creatinine, Ser: 0.59 mg/dL (ref 0.44–1.00)
GFR calc non Af Amer: >60 mL/min (ref >60)
Glucose, Bld: 167 mg/dL — ABNORMAL HIGH (ref 65–99)
Potassium: 4.3 mmol/L (ref 3.5–5.1)
SODIUM: 135 mmol/L (ref 135–145)
Total Bilirubin: 0.5 mg/dL (ref 0.3–1.2)
Total Protein: 6.9 g/dL (ref 6.5–8.1)

## 2015-02-25 LAB — CBC WITH DIFFERENTIAL/PLATELET
BASOS PCT: 0 %
Basophils Absolute: 0 10*3/uL (ref 0.0–0.1)
Eosinophils Absolute: 0.1 10*3/uL (ref 0.0–0.7)
Eosinophils Relative: 1 %
HEMATOCRIT: 37.2 % (ref 36.0–46.0)
HEMOGLOBIN: 12.4 g/dL (ref 12.0–15.0)
LYMPHS ABS: 1 10*3/uL (ref 0.7–4.0)
Lymphocytes Relative: 9 %
MCH: 31.3 pg (ref 26.0–34.0)
MCHC: 33.3 g/dL (ref 30.0–36.0)
MCV: 93.9 fL (ref 78.0–100.0)
MONOS PCT: 2 %
Monocytes Absolute: 0.2 10*3/uL (ref 0.1–1.0)
NEUTROS ABS: 9.9 10*3/uL — AB (ref 1.7–7.7)
NEUTROS PCT: 88 %
Platelets: 296 10*3/uL (ref 150–400)
RBC: 3.96 MIL/uL (ref 3.87–5.11)
RDW: 12.9 % (ref 11.5–15.5)
WBC: 11.2 10*3/uL — ABNORMAL HIGH (ref 4.0–10.5)

## 2015-02-25 LAB — STREP PNEUMONIAE URINARY ANTIGEN: Strep Pneumo Urinary Antigen: NEGATIVE

## 2015-02-25 LAB — HIV ANTIBODY (ROUTINE TESTING W REFLEX): HIV Screen 4th Generation wRfx: NONREACTIVE

## 2015-02-25 LAB — INFLUENZA PANEL BY PCR (TYPE A & B)
H1N1 flu by pcr: NOT DETECTED
Influenza A By PCR: NEGATIVE
Influenza B By PCR: NEGATIVE

## 2015-02-25 LAB — KETONES, URINE: KETONES UR: NEGATIVE mg/dL

## 2015-02-25 LAB — EXPECTORATED SPUTUM ASSESSMENT W GRAM STAIN, RFLX TO RESP C

## 2015-02-25 MED ORDER — INSULIN ASPART 100 UNIT/ML ~~LOC~~ SOLN
2.0000 [IU] | Freq: Once | SUBCUTANEOUS | Status: AC
Start: 2015-02-25 — End: 2015-02-25
  Administered 2015-02-25: 2 [IU] via SUBCUTANEOUS

## 2015-02-25 MED ORDER — INSULIN ASPART 100 UNIT/ML ~~LOC~~ SOLN: 2.0000 [IU] | Freq: Once | SUBCUTANEOUS | Status: AC

## 2015-02-25 MED ORDER — OXYCODONE HCL 5 MG PO TABS: 5.0000 mg | ORAL_TABLET | ORAL | Status: DC | PRN

## 2015-02-25 MED ORDER — INSULIN ASPART 100 UNIT/ML ~~LOC~~ SOLN: 0.0000 [IU] | Freq: Three times a day (TID) | SUBCUTANEOUS | Status: DC

## 2015-02-25 NOTE — Progress Notes (Signed)
ANTIBIOTIC CONSULT NOTE - INITIAL  Pharmacy Consult for Vancomycin and Zosyn  Indication: HCAP  Allergies  Allergen Reactions  . Nsaids Other (See Comments)    Contraindicated with gastric bypass    Patient Measurements: Height: 5\' 4"  (162.6 cm) Weight: 250 lb (113.399 kg) IBW/kg (Calculated) : 54.7 Adjusted Body Weight:   Vital Signs: Temp: 98.9 F (37.2 C) (10/12 2200) Temp Source: Oral (10/12 1715) BP: 140/88 mmHg (10/13 0146) Pulse Rate: 93 (10/12 2200) Intake/Output from previous day: 10/12 0701 - 10/13 0700 In: 1467.5 [I.V.:217.5; IV Piggyback:1250] Out: -  Intake/Output from this shift: Total I/O In: 217.5 [I.V.:217.5] Out: -   Labs:  Recent Labs  02/24/15 1500  WBC 14.5*  HGB 13.2  PLT 286  CREATININE 0.53   Estimated Creatinine Clearance: 113.1 mL/min (by C-G formula based on Cr of 0.53). No results for input(s): VANCOTROUGH, VANCOPEAK, VANCORANDOM, GENTTROUGH, GENTPEAK, GENTRANDOM, TOBRATROUGH, TOBRAPEAK, TOBRARND, AMIKACINPEAK, AMIKACINTROU, AMIKACIN in the last 72 hours.   Microbiology: Recent Results (from the past 720 hour(s))  Culture, sputum-assessment     Status: None   Collection Time: 02/25/15 12:10 AM  Result Value Ref Range Status   Specimen Description SPUTUM  Final   Special Requests NONE  Final   Sputum evaluation   Final    MICROSCOPIC FINDINGS SUGGEST THAT THIS SPECIMEN IS NOT REPRESENTATIVE OF LOWER RESPIRATORY SECRETIONS. PLEASE RECOLLECT. CALLED PAULINE/5W @0222  ON 02/25/15 BY KARCZEWSKI,S.    Report Status 02/25/2015 FINAL  Final    Medical History: Past Medical History  Diagnosis Date  . Kidney stone   . Anxiety   . Bipolar affective disorder (HCC)   . SBO (small bowel obstruction) (HCC)     Medications:  Anti-infectives    Start     Dose/Rate Route Frequency Ordered Stop   02/25/15 1000  valACYclovir (VALTREX) tablet 1,000 mg     1,000 mg Oral Daily 02/24/15 2152     02/25/15 0200  vancomycin (VANCOCIN) IVPB 1000  mg/200 mL premix     1,000 mg 200 mL/hr over 60 Minutes Intravenous Every 8 hours 02/24/15 2220     02/24/15 2230  piperacillin-tazobactam (ZOSYN) IVPB 3.375 g     3.375 g 12.5 mL/hr over 240 Minutes Intravenous 3 times per day 02/24/15 2220     02/24/15 1600  vancomycin (VANCOCIN) IVPB 1000 mg/200 mL premix     1,000 mg 200 mL/hr over 60 Minutes Intravenous  Once 02/24/15 1551 02/24/15 1825   02/24/15 1600  piperacillin-tazobactam (ZOSYN) IVPB 3.375 g     3.375 g 100 mL/hr over 30 Minutes Intravenous  Once 02/24/15 1551 02/24/15 1645     Assessment: Patient with HCAP.  First dose of antibiotics already given in ED.  Goal of Therapy:  Vancomycin trough level 15-20 mcg/ml  Zosyn based on renal function  Appropriate antibiotic dosing for renal function; eradication of infection   Plan:  Measure antibiotic drug levels at steady state Follow up culture results Vancomycin 1gm iv q8hr  Zosyn 3.375g IV Q8H infused over 4hrs.   Darlina GuysGrimsley Jr, Jacquenette ShoneJulian Crowford 02/25/2015,5:11 AM

## 2015-02-25 NOTE — Progress Notes (Addendum)
Lab called to give results of anaerobic bld cultures :  Gm + cocci in clusters.  Results given to hospitalist on call

## 2015-02-25 NOTE — Progress Notes (Signed)
Patient ambulated in halls O2 stats remained from 93-95% on room air. No dyspnea noted.

## 2015-02-25 NOTE — Progress Notes (Signed)
[facility] PROGRESS NOTE  Sharen HeckJennifer S Turner QIO:[id number] DOB: [birth year] DOA: 02/24/2015 PCP: No PCP Per Patient  HPI/Brief narrative 44yo with a history of bronchospasm airway disease, bipolar disorder, obesity. Patient presents with complaints of progressively worsening shortness of breath. Patient was admitted for HCAP and asthma exacerbation.  Assessment/Plan: 1. HCAP 1. Pt is continued on empiric vancomycin and zosyn 2. Pt was recently discharged for bronchitis s/p abx 3. Cont pulm toilet 2. Asthma exacerbation 1. No wheezing auscultated currently 2. Pt remains on min O2 support 3. Cont scheduled IV steroids 4. Cont scheduled nebs as needed 3. Bipolar disorder 1. Stable 4. Obesity 1. Stable at present 5. DVT prophylaxis 1. Lovenox subQ  Code Status: full Family Communication: Pt in room Disposition Plan: Pending   Consultants:    Procedures:    Antibiotics: Anti-infectives    Start     Dose/Rate Route Frequency Ordered Stop   02/25/15 1000  valACYclovir (VALTREX) tablet 1,000 mg     1,000 mg Oral Daily 02/24/15 2152     02/25/15 0200  vancomycin (VANCOCIN) IVPB 1000 mg/200 mL premix     1,000 mg 200 mL/hr over 60 Minutes Intravenous Every 8 hours 02/24/15 2220     02/24/15 2230  piperacillin-tazobactam (ZOSYN) IVPB 3.375 g     3.375 g 12.5 mL/hr over 240 Minutes Intravenous 3 times per day 02/24/15 2220     02/24/15 1600  vancomycin (VANCOCIN) IVPB 1000 mg/200 mL premix     1,000 mg 200 mL/hr over 60 Minutes Intravenous  Once 02/24/15 1551 02/24/15 1825   02/24/15 1600  piperacillin-tazobactam (ZOSYN) IVPB 3.375 g     3.375 g 100 mL/hr over 30 Minutes Intravenous  Once 02/24/15 1551 02/24/15 1645      HPI/Subjective: States feeling somewhat better but still sob  Objective: Filed Vitals:   02/25/15 0846 02/25/15 0944 02/25/15 1400 02/25/15 1427  BP:  122/69 149/98   Pulse:  99 71   Temp:  97.7 F (36.5 C) 97.8 F (36.6 C)   TempSrc:   Oral Oral   Resp:  18 18   Height:      Weight:      SpO2: 94% 92% 92% 95%    Intake/Output Summary (Last 24 hours) at 02/25/15 1518 Last data filed at 02/25/15 1405  Gross per 24 hour  Intake 2759.5 ml  Output      0 ml  Net 2759.5 ml   Filed Weights   02/24/15 1320  Weight: 113.399 kg (250 lb)    Exam:   General:  Awake, in nad  Cardiovascular: regular, s1, s2  Respiratory: normal resp effort no wheezing  Abdomen: soft, nondistended  Musculoskeletal: perfused, no clubbing   Data Reviewed: Basic Metabolic Panel:  Recent Labs Lab 02/24/15 1500 02/25/15 0510  NA 137 135  K 3.7 4.3  CL 106 104  CO2 23 23  GLUCOSE 137* 167*  BUN 11 9  CREATININE 0.53 0.59  CALCIUM 9.0 8.3*   Liver Function Tests:  Recent Labs Lab 02/24/15 1500 02/25/15 0510  AST 20 16  ALT 13* 13*  ALKPHOS 71 70  BILITOT 0.2* 0.5  PROT 7.1 6.9  ALBUMIN 3.6 3.4*   No results for input(s): LIPASE, AMYLASE in the last 168 hours. No results for input(s): AMMONIA in the last 168 hours. CBC:  Recent Labs Lab 02/24/15 1500 02/25/15 0510  WBC 14.5* 11.2*  NEUTROABS 12.7* 9.9*  HGB 13.2 12.4  HCT 39.1 37.2  MCV 92.0 93.9  PLT 286 296   Cardiac Enzymes:  Recent Labs Lab 02/24/15 1500  TROPONINI <0.03   BNP (last 3 results)  Recent Labs  01/01/15 1320  BNP 157.3*    ProBNP (last 3 results) No results for input(s): PROBNP in the last 8760 hours.  CBG:  Recent Labs Lab 02/24/15 2351  GLUCAP 92    Recent Results (from the past 240 hour(s))  Blood culture (routine x 2)     Status: None (Preliminary result)   Collection Time: 02/24/15  3:38 PM  Result Value Ref Range Status   Specimen Description BLOOD LEFT AC  Final   Special Requests BOTTLES DRAWN AEROBIC AND ANAEROBIC 5 CC EACH  Final   Culture   Final    NO GROWTH < 24 HOURS Performed at [facility]    Report Status PENDING  Incomplete  Blood culture (routine x 2)     Status: None (Preliminary  result)   Collection Time: 02/24/15  4:00 PM  Result Value Ref Range Status   Specimen Description BLOOD RIGHT AC  Final   Special Requests BOTTLES DRAWN AEROBIC AND ANAEROBIC 5CC EACH  Final   Culture   Final    NO GROWTH < 24 HOURS Performed at [facility]    Report Status PENDING  Incomplete  Culture, sputum-assessment     Status: None   Collection Time: 02/25/15 12:10 AM  Result Value Ref Range Status   Specimen Description SPUTUM  Final   Special Requests NONE  Final   Sputum evaluation   Final    MICROSCOPIC FINDINGS SUGGEST THAT THIS SPECIMEN IS NOT REPRESENTATIVE OF LOWER RESPIRATORY SECRETIONS. PLEASE RECOLLECT. CALLED PAULINE/5W  ON 02/25/15 BY KARCZEWSKI,S.    Report Status 02/25/2015 FINAL  Final     Studies: Dg Chest 2 View  02/24/2015  CLINICAL DATA:  Cough, shortness of breath, and hypoxia for 6 days. EXAM: CHEST  2 VIEW COMPARISON:  01/01/2015 FINDINGS: The heart size and mediastinal contours are within normal limits. New mild airspace disease is seen in the right upper lobe, suspicious for pneumonia. There is also new linear opacity in the left upper lobe, consistent with atelectasis. No evidence of pleural effusion. IMPRESSION: Right upper lobe airspace disease, consistent with pneumonia. Mild subsegmental atelectasis also noted in left upper lobe. Electronically Signed   By: Myles Rosenthal M.D.   On: 02/24/2015 13:37    Scheduled Meds: . amitriptyline  25 mg Oral TID  . diazepam  10 mg Oral TID  . doxazosin  1 mg Oral Daily  . enoxaparin (LOVENOX) injection  40 mg Subcutaneous Q24H  . FLUoxetine  60 mg Oral Daily  . guaiFENesin  600 mg Oral BID  . ipratropium-albuterol  3 mL Nebulization Q6H  . methylPREDNISolone (SOLU-MEDROL) injection  60 mg Intravenous 4 times per day  . Oxcarbazepine  300 mg Oral TID  . piperacillin-tazobactam (ZOSYN)  IV  3.375 g Intravenous 3 times per day  . QUEtiapine  200 mg Oral QHS  . trifluoperazine  2 mg Oral BID  .  valACYclovir  1,000 mg Oral Daily  . vancomycin  1,000 mg Intravenous Q8H  . zolpidem  5 mg Oral QHS   Continuous Infusions: . sodium chloride 75 mL/hr at 02/24/15 2306    Principal Problem:   HCAP (healthcare-associated pneumonia) Active Problems:   Bipolar affective disorder (HCC)   Anxiety state   Bronchospasm, acute    Quintasha Gren K  [facility] Pager [phone]. If 7PM-7AM, please  contact night-coverage at [email or web address], password [facility] 02/25/2015, 3:18 PM  LOS: 1 day

## 2015-02-25 NOTE — Progress Notes (Signed)
PT demonstrated verbal and hands on understanding of Flutter device. 

## 2015-02-25 NOTE — H&P (Signed)
[facility] History and Physical  Patient: Ashley Franklin  MRN: [id number]  DOB: 17-May-1970  DOS: the patient was seen and examined on 02/24/2015 PCP: No PCP Per Patient  Referring physician: Dr Silverio Lay Chief Complaint: Shortness of breath  HPI: Ashley Franklin is a 44 y.o. female with Past medical history of bronchospasm  airway disease, bipolar disorder, obesity. Patient presents with complaints of progressively worsening shortness of breath. The patient was recently hospitalized for bronchospasm secondary to acute bronchitis and was treated with steroids nebulizer as well as antibiotic. The patient was seen in the urgent care for reoccurrence of the symptoms 1 week ago. The patient does have cough as well as expectoration of yellow sputum. She denies any chest pain at the time of my evaluation but did have some earlier. She also comments of fever and chills. She complains of occasional nausea with the cough denies any vomiting or diarrhea or constipation or burning urination or abdominal pain. She complains of severe back pains associated with cough. Denies any changes in her medications.   The patient is coming from home  At her baseline ambulates without support And is independent for most of her ADL; manages her medication on her own.  Review of Systems: as mentioned in the history of present illness.  A comprehensive review of the other systems is negative.  Past Medical History  Diagnosis Date  . Kidney stone   . Anxiety   . Bipolar affective disorder (HCC)   . SBO (small bowel obstruction) [facility])    Past Surgical History  Procedure Laterality Date  . Cesarean section    . Gastric bypass    . Uterine ablasion    . Tubal ligation     Social History:  reports that she has been smoking.  She has never used smokeless tobacco. She reports that she does not drink alcohol or use illicit drugs.  Allergies  Allergen Reactions  . Nsaids Other (See Comments)   Contraindicated with gastric bypass    Family History  Problem Relation Age of Onset  . Diabetes Mother   . Cancer Mother     Prior to Admission medications   Medication Sig Start Date End Date Taking? Authorizing Provider  albuterol (PROVENTIL HFA;VENTOLIN HFA) 108 (90 BASE) MCG/ACT inhaler Inhale 2 puffs into the lungs every 4 (four) hours as needed for wheezing or shortness of breath. 02/22/15  Yes Lattie Haw, MD  amitriptyline (ELAVIL) 25 MG tablet Take 25 mg by mouth 3 (three) times daily.   Yes Historical Provider, MD  benzonatate (TESSALON) 200 MG capsule Take 1 capsule (200 mg total) by mouth at bedtime. Take as needed for cough 02/22/15  Yes Lattie Haw, MD  cefdinir (OMNICEF) 300 MG capsule Take 1 capsule (300 mg total) by mouth 2 (two) times daily. 02/22/15  Yes Lattie Haw, MD  dextromethorphan-guaiFENesin [facility] DM) 30-600 MG 12hr tablet Take 1 tablet by mouth every 12 (twelve) hours.   Yes Historical Provider, MD  diazepam (VALIUM) 10 MG tablet Take 10 mg by mouth 3 (three) times daily.   Yes Historical Provider, MD  doxazosin (CARDURA) 1 MG tablet Take 1 mg by mouth 3 (three) times daily.  12/30/14  Yes Historical Provider, MD  FLUoxetine (PROZAC) 20 MG tablet Take 60 mg by mouth daily.    Yes Historical Provider, MD  Oxcarbazepine (TRILEPTAL) 300 MG tablet Take 300 mg by mouth 3 (three) times daily.   Yes Historical Provider, MD  predniSONE (DELTASONE)  20 MG tablet Take 1 tablet (20 mg total) by mouth 2 (two) times daily. Take with food. 02/22/15  Yes Lattie Haw, MD  QUEtiapine (SEROQUEL) 200 MG tablet Take 200 mg by mouth at bedtime.   Yes Historical Provider, MD  trifluoperazine (STELAZINE) 2 MG tablet Take 2 mg by mouth 2 (two) times daily.   Yes Historical Provider, MD  valACYclovir (VALTREX) 1000 MG tablet Take 1,000 mg by mouth daily.   Yes Historical Provider, MD  zolpidem (AMBIEN) 10 MG tablet Take 10 mg by mouth at bedtime.    Yes Historical  Provider, MD    Physical Exam: Filed Vitals:   02/24/15 2024 02/24/15 2200 02/25/15 0146 02/25/15 0154  BP:  119/64 140/88   Pulse:  93    Temp:  98.9 F (37.2 C)    TempSrc:      Resp:  18    Height:      Weight:      SpO2: 96% 95%  93%    General: Alert, Awake and Oriented to Time, Place and Person. Appear in mild distress Eyes: PERRL ENT: Oral Mucosa clear moist. Neck: no JVD Cardiovascular: S1 and S2 Present, no Murmur, Peripheral Pulses Present Respiratory: Bilateral Air entry equal and Decreased,  Significant bilateral rhonchi as well as basal Crackles, expiratory wheezes Abdomen: Bowel Sound present, Soft and no tenderness Skin: no Rash Extremities: no Pedal edema, no calf tenderness Neurologic: Grossly no focal neuro deficit. Labs on Admission:  CBC:  Recent Labs Lab 02/24/15 1500  WBC 14.5*  NEUTROABS 12.7*  HGB 13.2  HCT 39.1  MCV 92.0  PLT 286    CMP     Component Value Date/Time   NA 137 02/24/2015 1500   K 3.7 02/24/2015 1500   CL 106 02/24/2015 1500   CO2 23 02/24/2015 1500   GLUCOSE 137* 02/24/2015 1500   BUN 11 02/24/2015 1500   CREATININE 0.53 02/24/2015 1500   CALCIUM 9.0 02/24/2015 1500   PROT 7.1 02/24/2015 1500   ALBUMIN 3.6 02/24/2015 1500   AST 20 02/24/2015 1500   ALT 13* 02/24/2015 1500   ALKPHOS 71 02/24/2015 1500   BILITOT 0.2* 02/24/2015 1500   GFRNONAA >60 02/24/2015 1500   GFRAA >60 02/24/2015 1500     Recent Labs Lab 02/24/15 1500  TROPONINI <0.03   BNP (last 3 results)  Recent Labs  01/01/15 1320  BNP 157.3*    ProBNP (last 3 results) No results for input(s): PROBNP in the last 8760 hours.   Radiological Exams on Admission: Dg Chest 2 View  02/24/2015  CLINICAL DATA:  Cough, shortness of breath, and hypoxia for 6 days. EXAM: CHEST  2 VIEW COMPARISON:  01/01/2015 FINDINGS: The heart size and mediastinal contours are within normal limits. New mild airspace disease is seen in the right upper lobe,  suspicious for pneumonia. There is also new linear opacity in the left upper lobe, consistent with atelectasis. No evidence of pleural effusion. IMPRESSION: Right upper lobe airspace disease, consistent with pneumonia. Mild subsegmental atelectasis also noted in left upper lobe. Electronically Signed   By: Myles Rosenthal M.D.   On: 02/24/2015 13:37    Assessment/Plan 1. HCAP (healthcare-associated pneumonia) The patient is presenting with complaint of progressively worsening cough and shortness of breath without any improvement. Patient's chest x-ray was documented as right upper lobe airspace disease consistent with pneumonia. The patient was found to be having significant hypoxia with saturations 88 on room air.  At present I would  treat her for suspected exacerbation of reactive airway disease probably asthma. Also she has evidence of pneumonia and therefore she will be added given antibiotics. We will follow the cultures. She will be given duo nebs and Solu-Medrol 60 mg every 6 hours. Pulmonary toilet with Mucinex as well as flutter device  2.   Bipolar affective disorder (HCC) Continuing her home medications at present.  Nutrition: Regular diet DVT Prophylaxis: subcutaneous Heparin  Advance goals of care discussion: Full code   Family Communication: family was present at bedside, opportunity was given to ask question and all questions were answered satisfactorily at the time of interview. Disposition: Admitted as inpatient, med-surge unit.  Author: Lynden OxfordPranav Patel, MD [facility] Pager: [phone] 02/24/2015 10:55 PM  If 7PM-7AM, please contact night-coverage [email or web address] Password [email or web address]

## 2015-02-26 LAB — CBC
HEMATOCRIT: 36.7 % (ref 36.0–46.0)
HEMOGLOBIN: 12.2 g/dL (ref 12.0–15.0)
MCH: 31.3 pg (ref 26.0–34.0)
MCHC: 33.2 g/dL (ref 30.0–36.0)
MCV: 94.1 fL (ref 78.0–100.0)
Platelets: 298 10*3/uL (ref 150–400)
RBC: 3.9 MIL/uL (ref 3.87–5.11)
RDW: 12.9 % (ref 11.5–15.5)
WBC: 14.7 10*3/uL — ABNORMAL HIGH (ref 4.0–10.5)

## 2015-02-26 LAB — GLUCOSE, CAPILLARY
GLUCOSE-CAPILLARY: 162 mg/dL — AB (ref 65–99)
GLUCOSE-CAPILLARY: 299 mg/dL — AB (ref 65–99)
GLUCOSE-CAPILLARY: 249 mg/dL — AB (ref 65–99)
Glucose-Capillary: 185 mg/dL — ABNORMAL HIGH (ref 65–99)
Glucose-Capillary: 220 mg/dL — ABNORMAL HIGH (ref 65–99)

## 2015-02-26 LAB — VANCOMYCIN, TROUGH: VANCOMYCIN TR: 11 ug/mL (ref 10.0–20.0)

## 2015-02-26 LAB — LACTIC ACID, PLASMA: Lactic Acid, Venous: 2.2 mmol/L (ref 0.5–2.0)

## 2015-02-26 MED ORDER — ENOXAPARIN SODIUM 60 MG/0.6ML ~~LOC~~ SOLN
0.5000 mg/kg | SUBCUTANEOUS | Status: DC
  Administered 2015-02-26 – 2015-02-27 (×2): 55 mg via SUBCUTANEOUS
  Filled 2015-02-26 (×3): qty 0.6

## 2015-02-26 MED ORDER — PIPERACILLIN-TAZOBACTAM 3.375 G IVPB
3.3750 g | Freq: Three times a day (TID) | INTRAVENOUS | Status: DC
  Administered 2015-02-26 – 2015-02-28 (×6): 3.375 g via INTRAVENOUS
  Filled 2015-02-26 (×8): qty 50

## 2015-02-26 MED ORDER — IPRATROPIUM-ALBUTEROL 0.5-2.5 (3) MG/3ML IN SOLN
3.0000 mL | Freq: Three times a day (TID) | RESPIRATORY_TRACT | Status: DC
  Administered 2015-02-26 – 2015-02-28 (×7): 3 mL via RESPIRATORY_TRACT
  Filled 2015-02-26 (×7): qty 3

## 2015-02-26 MED ORDER — VANCOMYCIN HCL IN DEXTROSE 1-5 GM/200ML-% IV SOLN
1000.0000 mg | Freq: Three times a day (TID) | INTRAVENOUS | Status: DC
  Administered 2015-02-26 (×2): 1000 mg via INTRAVENOUS
  Filled 2015-02-26 (×3): qty 200

## 2015-02-26 MED ORDER — IPRATROPIUM-ALBUTEROL 0.5-2.5 (3) MG/3ML IN SOLN: 3.0000 mL | RESPIRATORY_TRACT | Status: DC | PRN

## 2015-02-26 MED ORDER — METHYLPREDNISOLONE SODIUM SUCC 125 MG IJ SOLR
60.0000 mg | Freq: Two times a day (BID) | INTRAMUSCULAR | Status: DC
  Administered 2015-02-26 – 2015-02-27 (×2): 60 mg via INTRAVENOUS
  Filled 2015-02-26 (×4): qty 0.96

## 2015-02-26 MED ORDER — SODIUM CHLORIDE 0.9 % IV SOLN
1250.0000 mg | Freq: Three times a day (TID) | INTRAVENOUS | Status: DC
  Administered 2015-02-27 – 2015-02-28 (×5): 1250 mg via INTRAVENOUS
  Filled 2015-02-26 (×6): qty 1250

## 2015-02-26 NOTE — Progress Notes (Signed)
cbg rechecked- 388.  Pt states that she is very sensitive to insulin.  hospitalist notified.  Orders received. 2 units of Novolog given.  Will monitor pt closely.  Husband in room

## 2015-02-26 NOTE — Progress Notes (Signed)
[facility] PROGRESS NOTE  Ashley HeckJennifer S Franklin ZOX:[id number] DOB: Oct 23, 1970 DOA: 02/24/2015 PCP: No PCP Per Patient  HPI/Brief narrative 44yo with a history of bronchospasm airway disease, bipolar disorder, obesity. Patient presents with complaints of progressively worsening shortness of breath. Patient was admitted for HCAP and asthma exacerbation.  Assessment/Plan: 1. HCAP 1. Pt is continued on empiric vancomycin and zosyn 2. Pt was recently discharged for bronchitis s/p abx 3. Cont pulm toilet 4. Pt has noted feeling much improved this AM and had ambulated without O2 2. Asthma exacerbation 1. Mild end-expiratory wheezing on exam 2. Pt remains on min O2 support 3. Cont scheduled IV steroids but will decrease to q12h dosing 4. Cont scheduled nebs as needed 3. GM pos bacteremia 1. Thus far 1/2 blood cx pos for gm pos organisms in clusters. Await speciation and sensitivities 2. Cont above abx for now 4. Bipolar disorder 1. Stable 5. Obesity 1. Stable at present 6. DVT prophylaxis 1. Lovenox subQ  Code Status: full Family Communication: Pt in room Disposition Plan: Pending   Consultants:    Procedures:    Antibiotics: Anti-infectives    Start     Dose/Rate Route Frequency Ordered Stop   02/26/15 1400  piperacillin-tazobactam (ZOSYN) IVPB 3.375 g     3.375 g 12.5 mL/hr over 240 Minutes Intravenous 3 times per day 02/26/15 0746     02/26/15 1000  vancomycin (VANCOCIN) IVPB 1000 mg/200 mL premix     1,000 mg 200 mL/hr over 60 Minutes Intravenous Every 8 hours 02/26/15 0746     02/25/15 1000  valACYclovir (VALTREX) tablet 1,000 mg     1,000 mg Oral Daily 02/24/15 2152     02/25/15 0200  vancomycin (VANCOCIN) IVPB 1000 mg/200 mL premix  Status:  Discontinued     1,000 mg 200 mL/hr over 60 Minutes Intravenous Every 8 hours 02/24/15 2220 02/26/15 0744   02/24/15 2230  piperacillin-tazobactam (ZOSYN) IVPB 3.375 g  Status:  Discontinued     3.375 g 12.5 mL/hr  over 240 Minutes Intravenous 3 times per day 02/24/15 2220 02/26/15 0744   02/24/15 1600  vancomycin (VANCOCIN) IVPB 1000 mg/200 mL premix     1,000 mg 200 mL/hr over 60 Minutes Intravenous  Once 02/24/15 1551 02/24/15 1825   02/24/15 1600  piperacillin-tazobactam (ZOSYN) IVPB 3.375 g     3.375 g 100 mL/hr over 30 Minutes Intravenous  Once 02/24/15 1551 02/24/15 1645      HPI/Subjective: Reports feeling much better today  Objective: Filed Vitals:   02/25/15 1427 02/25/15 2227 02/26/15 0459 02/26/15 0854  BP:  129/75 127/77   Pulse:  96 64 69  Temp:  98 F (36.7 C) 98 F (36.7 C)   TempSrc:  Oral Oral   Resp:  20  20  Height:      Weight:      SpO2: 95% 94% 95% 91%    Intake/Output Summary (Last 24 hours) at 02/26/15 1251 Last data filed at 02/26/15 0943  Gross per 24 hour  Intake 2243.25 ml  Output      0 ml  Net 2243.25 ml   Filed Weights   02/24/15 1320  Weight: 113.399 kg (250 lb)    Exam:   General:  Awake, in nad  Cardiovascular: regular, s1, s2  Respiratory: normal resp effort, mild end-expiratory wheezing  Abdomen: soft, nondistended  Musculoskeletal: perfused, no clubbing   Data Reviewed: Basic Metabolic Panel:  Recent Labs Lab 02/24/15 1500 02/25/15 0510  NA 137 135  K 3.7 4.3  CL 106 104  CO2 23 23  GLUCOSE 137* 167*  BUN 11 9  CREATININE 0.53 0.59  CALCIUM 9.0 8.3*   Liver Function Tests:  Recent Labs Lab 02/24/15 1500 02/25/15 0510  AST 20 16  ALT 13* 13*  ALKPHOS 71 70  BILITOT 0.2* 0.5  PROT 7.1 6.9  ALBUMIN 3.6 3.4*   No results for input(s): LIPASE, AMYLASE in the last 168 hours. No results for input(s): AMMONIA in the last 168 hours. CBC:  Recent Labs Lab 02/24/15 1500 02/25/15 0510 02/26/15 0503  WBC 14.5* 11.2* 14.7*  NEUTROABS 12.7* 9.9*  --   HGB 13.2 12.4 12.2  HCT 39.1 37.2 36.7  MCV 92.0 93.9 94.1  PLT 286 296 298   Cardiac Enzymes:  Recent Labs Lab 02/24/15 1500  TROPONINI <0.03   BNP  (last 3 results)  Recent Labs  01/01/15 1320  BNP 157.3*    ProBNP (last 3 results) No results for input(s): PROBNP in the last 8760 hours.  CBG:  Recent Labs Lab 02/25/15 2101 02/25/15 2159 02/26/15 0641 02/26/15 0843 02/26/15 1226  GLUCAP 323* 293* 185* 162* 299*    Recent Results (from the past 240 hour(s))  Blood culture (routine x 2)     Status: None (Preliminary result)   Collection Time: 02/24/15  3:38 PM  Result Value Ref Range Status   Specimen Description BLOOD LEFT AC  Final   Special Requests BOTTLES DRAWN AEROBIC AND ANAEROBIC 5 CC EACH  Final   Culture  Setup Time   Final    GRAM POSITIVE COCCI IN CLUSTERS ANAEROBIC BOTTLE ONLY CRITICAL RESULT CALLED TO, READ BACK BY AND VERIFIED WITH: Elenore Rota RN [id number] 02/25/15 A BROWNING    Culture   Final    TOO YOUNG TO READ Performed at [facility]    Report Status PENDING  Incomplete  Blood culture (routine x 2)     Status: None (Preliminary result)   Collection Time: 02/24/15  4:00 PM  Result Value Ref Range Status   Specimen Description BLOOD RIGHT AC  Final   Special Requests BOTTLES DRAWN AEROBIC AND ANAEROBIC 5CC EACH  Final   Culture   Final    NO GROWTH < 24 HOURS Performed at [facility]    Report Status PENDING  Incomplete  Culture, sputum-assessment     Status: None   Collection Time: 02/25/15 12:10 AM  Result Value Ref Range Status   Specimen Description SPUTUM  Final   Special Requests NONE  Final   Sputum evaluation   Final    MICROSCOPIC FINDINGS SUGGEST THAT THIS SPECIMEN IS NOT REPRESENTATIVE OF LOWER RESPIRATORY SECRETIONS. PLEASE RECOLLECT. CALLED PAULINE/5W  ON 02/25/15 BY KARCZEWSKI,S.    Report Status 02/25/2015 FINAL  Final     Studies: Dg Chest 2 View  02/24/2015  CLINICAL DATA:  Cough, shortness of breath, and hypoxia for 6 days. EXAM: CHEST  2 VIEW COMPARISON:  01/01/2015 FINDINGS: The heart size and mediastinal contours are within normal limits. New mild  airspace disease is seen in the right upper lobe, suspicious for pneumonia. There is also new linear opacity in the left upper lobe, consistent with atelectasis. No evidence of pleural effusion. IMPRESSION: Right upper lobe airspace disease, consistent with pneumonia. Mild subsegmental atelectasis also noted in left upper lobe. Electronically Signed   By: Myles Rosenthal M.D.   On: 02/24/2015 13:37    Scheduled Meds: . amitriptyline  25 mg Oral TID  .  diazepam  10 mg Oral TID  . doxazosin  1 mg Oral Daily  . enoxaparin (LOVENOX) injection  0.5 mg/kg Subcutaneous Q24H  . FLUoxetine  60 mg Oral Daily  . guaiFENesin  600 mg Oral BID  . insulin aspart  0-2 Units Subcutaneous TID WC  . ipratropium-albuterol  3 mL Nebulization TID  . methylPREDNISolone (SOLU-MEDROL) injection  60 mg Intravenous Q12H  . Oxcarbazepine  300 mg Oral TID  . piperacillin-tazobactam (ZOSYN)  IV  3.375 g Intravenous 3 times per day  . QUEtiapine  200 mg Oral QHS  . trifluoperazine  2 mg Oral BID  . valACYclovir  1,000 mg Oral Daily  . vancomycin  1,000 mg Intravenous Q8H  . zolpidem  5 mg Oral QHS   Continuous Infusions: . sodium chloride 75 mL/hr at 02/26/15 1126    Principal Problem:   HCAP (healthcare-associated pneumonia) Active Problems:   Bipolar affective disorder (HCC)   Anxiety state   Bronchospasm, acute    Braylee Lal K  [facility] Pager [phone]. If 7PM-7AM, please contact night-coverage at [email or web address], password [facility] 02/26/2015, 12:51 PM  LOS: 2 days

## 2015-02-26 NOTE — Progress Notes (Signed)
Brief pharmacy notes:  For complete details please see note from earlier today by pharmacist Greer PickerelJigna Gadhia  Vanc trough = 11 (subtherapeutic)  Plan: Increase vancomycin to 1250mg  IV q8h Follow renal function, cultures, clinical course Additional vanc trough as indicated  Arley PhenixEllen Ziomara Birenbaum RPh 02/26/2015, 9:07 PM Pager [phone]

## 2015-02-26 NOTE — Progress Notes (Signed)
pt's abnormal lactic acid results called from lab- 2.2. - texted results to L. [place]uk

## 2015-02-26 NOTE — Progress Notes (Addendum)
ANTIBIOTIC CONSULT NOTE - Follow-Up  Pharmacy Consult for Vancomycin and Zosyn  Indication: HCAP  Allergies  Allergen Reactions  . Nsaids Other (See Comments)    Contraindicated with gastric bypass    Patient Measurements: Height:  (162.6 cm) Weight: 250 lb (113.399 kg) IBW/kg (Calculated) : 54.7  Vital Signs: Temp: 98 F (36.7 C) (10/14 0459) Temp Source: Oral (10/14 0459) BP: 127/77 mmHg (10/14 0459) Pulse Rate: 64 (10/14 0459) Intake/Output from previous day: 10/13 0701 - 10/14 0700 In: 3058.3 [P.O.:720; I.V.:1638.3; IV Piggyback:700] Out: -  Intake/Output from this shift:    Labs:  Recent Labs  02/24/15 1500 02/25/15 0510 02/26/15 0503  WBC 14.5* 11.2* 14.7*  HGB 13.2 12.4 12.2  PLT 286 296 298  CREATININE 0.53 0.59  --    Estimated Creatinine Clearance: 113.1 mL/min (by C-G formula based on Cr of 0.59). No results for input(s): VANCOTROUGH, VANCOPEAK, VANCORANDOM, GENTTROUGH, GENTPEAK, GENTRANDOM, TOBRATROUGH, TOBRAPEAK, TOBRARND, AMIKACINPEAK, AMIKACINTROU, AMIKACIN in the last 72 hours.   Microbiology: Recent Results (from the past 720 hour(s))  Blood culture (routine x 2)     Status: None (Preliminary result)   Collection Time: 02/24/15  3:38 PM  Result Value Ref Range Status   Specimen Description BLOOD LEFT AC  Final   Special Requests BOTTLES DRAWN AEROBIC AND ANAEROBIC 5 CC EACH  Final   Culture  Setup Time   Final    GRAM POSITIVE COCCI IN CLUSTERS ANAEROBIC BOTTLE ONLY CRITICAL RESULT CALLED TO, READ BACK BY AND VERIFIED WITH: Elenore Rota RN [id number] 02/25/15 A BROWNING    Culture   Final    NO GROWTH < 24 HOURS Performed at [facility]    Report Status PENDING  Incomplete  Blood culture (routine x 2)     Status: None (Preliminary result)   Collection Time: 02/24/15  4:00 PM  Result Value Ref Range Status   Specimen Description BLOOD RIGHT AC  Final   Special Requests BOTTLES DRAWN AEROBIC AND ANAEROBIC 5CC EACH  Final   Culture    Final    NO GROWTH < 24 HOURS Performed at [facility]    Report Status PENDING  Incomplete  Culture, sputum-assessment     Status: None   Collection Time: 02/25/15 12:10 AM  Result Value Ref Range Status   Specimen Description SPUTUM  Final   Special Requests NONE  Final   Sputum evaluation   Final    MICROSCOPIC FINDINGS SUGGEST THAT THIS SPECIMEN IS NOT REPRESENTATIVE OF LOWER RESPIRATORY SECRETIONS. PLEASE RECOLLECT. CALLED PAULINE/5W  ON 02/25/15 BY KARCZEWSKI,S.    Report Status 02/25/2015 FINAL  Final    Medical History: Past Medical History  Diagnosis Date  . Kidney stone   . Anxiety   . Bipolar affective disorder (HCC)   . SBO (small bowel obstruction) (HCC)     Assessment: 40 y/oF with PMH of bronchospasmairway disease, bipolar disorder, and obesity recently hospitalized for bronchospasm 2/2 acute bronchitis who presented to [facility] ED on 10/12 with complaints of progressively worsening shortness of breath, found to have HCAP and asthma exacerbation. Pharmacy consulted to dose Vancomycin and Zosyn for HCAP.  10/12 >> Vancomycin >> 10/12 >> Zosyn >>    10/12 blood x 2: 1/2 with GPC in clusters 10/12 influenza: negative 10/13 sputum: not representative of lower respiratory secretions 10/13 Strep pneumo urinary antigen: negative Legionella urinary antigen: needs to be collected  Afebrile WBC elevated and trending up (on steroids) SCr WNL/stable (10/13) with CrCl >  100 ml/min CG/N Lactic Acid 2.53 > 2.2  Goal of Therapy:  Vancomycin trough level 15-20 mcg/ml  Appropriate antibiotic dosing for renal function and indication Eradication of infection   Plan:   Continue Vancomycin 1g IV q8h.  Check Vancomycin trough level prior to evening dose today.  Continue Zosyn 3.375g IV q8h (infuse over 4 hours).  Monitor renal function, cultures, clinical course.    Greer PickerelJigna Bijan Ridgley, PharmD, BCPS Pager: [phone] 02/26/2015 8:34 AM

## 2015-02-26 NOTE — Progress Notes (Signed)
Pt c/o dizziness, h/a, and tingling in extremities.  Pt informed me that she is NIDDM and has maintained normal bld sugars since 2001 after her gastric bypass except for the past year.  She ate 2 banana puddings for dinner.  cbg 369.  Will recheck in a few minutes and notify hospitalist on call

## 2015-02-26 NOTE — Progress Notes (Signed)
cbg 323.  Pt states she is feeling much better.  [facility]  Urine sent for ketones.

## 2015-02-27 LAB — CBC
HEMATOCRIT: 38 % (ref 36.0–46.0)
Hemoglobin: 12.8 g/dL (ref 12.0–15.0)
MCH: 31.4 pg (ref 26.0–34.0)
MCHC: 33.7 g/dL (ref 30.0–36.0)
MCV: 93.4 fL (ref 78.0–100.0)
Platelets: 297 10*3/uL (ref 150–400)
RBC: 4.07 MIL/uL (ref 3.87–5.11)
RDW: 12.7 % (ref 11.5–15.5)
WBC: 13.4 10*3/uL — ABNORMAL HIGH (ref 4.0–10.5)

## 2015-02-27 LAB — GLUCOSE, CAPILLARY
GLUCOSE-CAPILLARY: 129 mg/dL — AB (ref 65–99)
Glucose-Capillary: 118 mg/dL — ABNORMAL HIGH (ref 65–99)
Glucose-Capillary: 162 mg/dL — ABNORMAL HIGH (ref 65–99)

## 2015-02-27 LAB — LACTIC ACID, PLASMA: Lactic Acid, Venous: 2.3 mmol/L (ref 0.5–2.0)

## 2015-02-27 MED ORDER — METHYLPREDNISOLONE SODIUM SUCC 125 MG IJ SOLR
60.0000 mg | INTRAMUSCULAR | Status: DC
  Administered 2015-02-28: 60 mg via INTRAVENOUS
  Filled 2015-02-27 (×2): qty 0.96

## 2015-02-27 NOTE — Progress Notes (Addendum)
CRITICAL VALUE ALERT  Critical value received:  Lactic acid 2.3  Date of notification:  02/27/15  Time of notification:  0910  Critical value read back: yes  Nurse who received alert:  Alda BertholdPaula Armstrong  MD notified (1st page):  Dr. Rhona Leavenshiu (by RN Tobie PoetJean Simran Bomkamp, primary nurse who was caring for another pt at time of call)  Time of first page:  0920  MD notified (2nd page):  n/a  Time of second page: n/a  Responding MD:  Dr. Rhona Leavenshiu  Time MD responded:  [phone]

## 2015-02-27 NOTE — Plan of Care (Signed)
Problem: Phase II Progression Outcomes Goal: Wean O2 if indicated Outcome: Progressing Pt off of O2 during day shift; O2 sats 95% sitting up in chair.

## 2015-02-27 NOTE — Progress Notes (Signed)
[facility] PROGRESS NOTE  Ashley Franklin MWU:[id number] DOB: 06-Oct-1970 DOA: 02/24/2015 PCP: No PCP Per Patient  HPI/Brief narrative 44yo with a history of bronchospasm airway disease, bipolar disorder, obesity. Patient presents with complaints of progressively worsening shortness of breath. Patient was admitted for HCAP and asthma exacerbation.  Assessment/Plan: 1. HCAP 1. Pt is continued on empiric vancomycin and zosyn 2. Pt was recently discharged for bronchitis s/p abx 3. Cont with pulm toilet 4. Pt reports feeling much improved and breathing near baseline 2. Asthma exacerbation 1. End-expiratory wheezing much improved 2. Pt remains on min O2 support 3. Cont scheduled IV steroids but will decrease to q24h dosing, cont to wean 4. Cont scheduled nebs as needed 3. GM pos bacteremia 1. Thus far 1/2 blood cx pos for gm pos organisms in clusters. Await speciation and sensitivities 2. Cont above abx for now 3. Repeat blood cx pending 4. Bipolar disorder 1. Stable 5. Obesity 1. Stable at present 6. DVT prophylaxis 1. Lovenox subQ  Code Status: full Family Communication: Pt in room Disposition Plan: Pending   Consultants:    Procedures:    Antibiotics: Anti-infectives    Start     Dose/Rate Route Frequency Ordered Stop   02/27/15 0200  vancomycin (VANCOCIN) 1,250 mg in sodium chloride 0.9 % 250 mL IVPB     1,250 mg 166.7 mL/hr over 90 Minutes Intravenous Every 8 hours 02/26/15 2101     02/26/15 1400  piperacillin-tazobactam (ZOSYN) IVPB 3.375 g     3.375 g 12.5 mL/hr over 240 Minutes Intravenous 3 times per day 02/26/15 0746     02/26/15 1000  vancomycin (VANCOCIN) IVPB 1000 mg/200 mL premix  Status:  Discontinued     1,000 mg 200 mL/hr over 60 Minutes Intravenous Every 8 hours 02/26/15 0746 02/26/15 2101   02/25/15 1000  valACYclovir (VALTREX) tablet 1,000 mg     1,000 mg Oral Daily 02/24/15 2152     02/25/15 0200  vancomycin (VANCOCIN) IVPB 1000  mg/200 mL premix  Status:  Discontinued     1,000 mg 200 mL/hr over 60 Minutes Intravenous Every 8 hours 02/24/15 2220 02/26/15 0744   02/24/15 2230  piperacillin-tazobactam (ZOSYN) IVPB 3.375 g  Status:  Discontinued     3.375 g 12.5 mL/hr over 240 Minutes Intravenous 3 times per day 02/24/15 2220 02/26/15 0744   02/24/15 1600  vancomycin (VANCOCIN) IVPB 1000 mg/200 mL premix     1,000 mg 200 mL/hr over 60 Minutes Intravenous  Once 02/24/15 1551 02/24/15 1825   02/24/15 1600  piperacillin-tazobactam (ZOSYN) IVPB 3.375 g     3.375 g 100 mL/hr over 30 Minutes Intravenous  Once 02/24/15 1551 02/24/15 1645      HPI/Subjective: No complaints. Denies sob  Objective: Filed Vitals:   02/27/15 0844 02/27/15 1055 02/27/15 1459 02/27/15 1507  BP:  118/62  132/81  Pulse:  81  62  Temp:    97.8 F (36.6 C)  TempSrc:    Axillary  Resp:    18  Height:      Weight:      SpO2: 96% 95% 98% 98%    Intake/Output Summary (Last 24 hours) at 02/27/15 1637 Last data filed at 02/27/15 1509  Gross per 24 hour  Intake 2282.5 ml  Output      0 ml  Net 2282.5 ml   Filed Weights   02/24/15 1320  Weight: 113.399 kg (250 lb)    Exam:   General:  Awake, ambulating in room, in  nad  Cardiovascular: regular, s1, s2  Respiratory: normal resp effort, no wheezing  Abdomen: soft, nondistended  Musculoskeletal: perfused, no clubbing, no cyanosis  Data Reviewed: Basic Metabolic Panel:  Recent Labs Lab 02/24/15 1500 02/25/15 0510  NA 137 135  K 3.7 4.3  CL 106 104  CO2 23 23  GLUCOSE 137* 167*  BUN 11 9  CREATININE 0.53 0.59  CALCIUM 9.0 8.3*   Liver Function Tests:  Recent Labs Lab 02/24/15 1500 02/25/15 0510  AST 20 16  ALT 13* 13*  ALKPHOS 71 70  BILITOT 0.2* 0.5  PROT 7.1 6.9  ALBUMIN 3.6 3.4*   No results for input(s): LIPASE, AMYLASE in the last 168 hours. No results for input(s): AMMONIA in the last 168 hours. CBC:  Recent Labs Lab 02/24/15 1500 02/25/15 0510  02/26/15 0503 02/27/15 0830  WBC 14.5* 11.2* 14.7* 13.4*  NEUTROABS 12.7* 9.9*  --   --   HGB 13.2 12.4 12.2 12.8  HCT 39.1 37.2 36.7 38.0  MCV 92.0 93.9 94.1 93.4  PLT 286 296 298 297   Cardiac Enzymes:  Recent Labs Lab 02/24/15 1500  TROPONINI <0.03   BNP (last 3 results)  Recent Labs  01/01/15 1320  BNP 157.3*    ProBNP (last 3 results) No results for input(s): PROBNP in the last 8760 hours.  CBG:  Recent Labs Lab 02/26/15 1226 02/26/15 1636 02/26/15 2146 02/27/15 0933 02/27/15 1514  GLUCAP 299* 249* 220* 129* 118*    Recent Results (from the past 240 hour(s))  Blood culture (routine x 2)     Status: None (Preliminary result)   Collection Time: 02/24/15  3:38 PM  Result Value Ref Range Status   Specimen Description BLOOD LEFT AC  Final   Special Requests BOTTLES DRAWN AEROBIC AND ANAEROBIC 5 CC EACH  Final   Culture  Setup Time   Final    GRAM POSITIVE COCCI IN CLUSTERS ANAEROBIC BOTTLE ONLY CRITICAL RESULT CALLED TO, READ BACK BY AND VERIFIED WITH: Elenore Rota RN [id number] 02/25/15 A BROWNING    Culture   Final    CULTURE REINCUBATED FOR BETTER GROWTH Performed at [facility]    Report Status PENDING  Incomplete  Blood culture (routine x 2)     Status: None (Preliminary result)   Collection Time: 02/24/15  4:00 PM  Result Value Ref Range Status   Specimen Description BLOOD RIGHT AC  Final   Special Requests BOTTLES DRAWN AEROBIC AND ANAEROBIC 5CC EACH  Final   Culture   Final    NO GROWTH 3 DAYS Performed at [facility]    Report Status PENDING  Incomplete  Culture, sputum-assessment     Status: None   Collection Time: 02/25/15 12:10 AM  Result Value Ref Range Status   Specimen Description SPUTUM  Final   Special Requests NONE  Final   Sputum evaluation   Final    MICROSCOPIC FINDINGS SUGGEST THAT THIS SPECIMEN IS NOT REPRESENTATIVE OF LOWER RESPIRATORY SECRETIONS. PLEASE RECOLLECT. CALLED PAULINE/5W  ON 02/25/15 BY  KARCZEWSKI,S.    Report Status 02/25/2015 FINAL  Final     Studies: No results found.  Scheduled Meds: . amitriptyline  25 mg Oral TID  . diazepam  10 mg Oral TID  . doxazosin  1 mg Oral Daily  . enoxaparin (LOVENOX) injection  0.5 mg/kg Subcutaneous Q24H  . FLUoxetine  60 mg Oral Daily  . guaiFENesin  600 mg Oral BID  . insulin aspart  0-2 Units Subcutaneous  TID WC  . ipratropium-albuterol  3 mL Nebulization TID  . methylPREDNISolone (SOLU-MEDROL) injection  60 mg Intravenous Q12H  . Oxcarbazepine  300 mg Oral TID  . piperacillin-tazobactam (ZOSYN)  IV  3.375 g Intravenous 3 times per day  . QUEtiapine  200 mg Oral QHS  . trifluoperazine  2 mg Oral BID  . valACYclovir  1,000 mg Oral Daily  . vancomycin  1,250 mg Intravenous Q8H  . zolpidem  5 mg Oral QHS   Continuous Infusions: . sodium chloride 75 mL/hr at 02/26/15 1126    Principal Problem:   HCAP (healthcare-associated pneumonia) Active Problems:   Bipolar affective disorder (HCC)   Anxiety state   Bronchospasm, acute    Ashley Franklin K  [facility] Pager [phone]. If 7PM-7AM, please contact night-coverage at [email or web address], password [email or web address] 02/27/2015, 4:37 PM  LOS: 3 days

## 2015-02-28 LAB — GLUCOSE, CAPILLARY
GLUCOSE-CAPILLARY: 144 mg/dL — AB (ref 65–99)
Glucose-Capillary: 120 mg/dL — ABNORMAL HIGH (ref 65–99)

## 2015-02-28 LAB — CULTURE, BLOOD (ROUTINE X 2)

## 2015-02-28 LAB — LACTIC ACID, PLASMA: LACTIC ACID, VENOUS: 1.4 mmol/L (ref 0.5–2.0)

## 2015-02-28 MED ORDER — PREDNISONE 20 MG PO TABS: 60.0000 mg | ORAL_TABLET | Freq: Every day | ORAL | Status: DC

## 2015-02-28 MED ORDER — IPRATROPIUM-ALBUTEROL 0.5-2.5 (3) MG/3ML IN SOLN: 3.0000 mL | Freq: Two times a day (BID) | RESPIRATORY_TRACT | Status: DC

## 2015-02-28 MED ORDER — PREDNISONE 50 MG PO TABS
60.0000 mg | ORAL_TABLET | Freq: Every day | ORAL | Status: DC
  Filled 2015-02-28: qty 1

## 2015-02-28 MED ORDER — LEVOFLOXACIN 500 MG PO TABS: 500.0000 mg | ORAL_TABLET | Freq: Every day | ORAL | Status: DC

## 2015-02-28 NOTE — Discharge Instructions (Signed)
°  Given your acute illness, you may return to work on 03/04/15

## 2015-02-28 NOTE — Discharge Summary (Signed)
Physician Discharge Summary  Ashley Franklin:[id number] DOB: Nov 06, 1970 DOA: 02/24/2015  PCP: No PCP Per Patient  Admit date: 02/24/2015 Discharge date: 02/28/2015  Time spent: 20 minutes  Recommendations for Outpatient Follow-up:  1. Follow up with PCP in 1-2 weeks  Discharge Diagnoses:  Principal Problem:   HCAP (healthcare-associated pneumonia) Active Problems:   Bipolar affective disorder (HCC)   Anxiety state   Bronchospasm, acute   Discharge Condition: Improved  Diet recommendation: Regular  Filed Weights   02/24/15 1320  Weight: 113.399 kg (250 lb)    History of present illness:  Please review h and p from 10/12 for details. Briefly, pt is a 44yo with a history of bronchospasm airway disease, bipolar disorder, obesity. Patient presents with complaints of progressively worsening shortness of breath. Patient was admitted for HCAP and asthma exacerbation.  Hospital Course:  1. HCAP 1. Pt was continued on empiric vancomycin and zosyn 2. Pt was recently discharged for bronchitis s/p abx 3. Continued with pulm toilet 4. Pt has since reported feeling much improved and breathing near baseline 2. Asthma exacerbation 1. End-expiratory wheezing much improved and essentially resolved 2. Pt was weaned to room air 3. Patient required scheduled IV steroids and nebs, to complete prednisone taper on discharge 3. 1/2 Coag neg staph blood cx 1. Likely NOT bacteremic and strongly suspect contaminant by coag neg staph 4. Bipolar disorder 1. Stable 5. Obesity 1. Stable at present 6. DVT prophylaxis 1. Lovenox subQ while admitted  Discharge Exam: Filed Vitals:   02/27/15 2200 02/28/15 0600 02/28/15 0841 02/28/15 1100  BP: 134/84 146/96  114/71  Pulse: 87 67  82  Temp: 97.9 F (36.6 C) 98.1 F (36.7 C)    TempSrc: Oral Oral    Resp: 16 16    Height:      Weight:      SpO2: 96% 97% 94%     General: Awake, in nad Cardiovascular: regular, s1, s2 Respiratory:  normal resp effort, no wheezing currently  Discharge Instructions     Medication List    STOP taking these medications        cefdinir 300 MG capsule  Commonly known as:  OMNICEF      TAKE these medications        albuterol 108 (90 BASE) MCG/ACT inhaler  Commonly known as:  PROVENTIL HFA;VENTOLIN HFA  Inhale 2 puffs into the lungs every 4 (four) hours as needed for wheezing or shortness of breath.     amitriptyline 25 MG tablet  Commonly known as:  ELAVIL  Take 25 mg by mouth 3 (three) times daily.     benzonatate 200 MG capsule  Commonly known as:  TESSALON  Take 1 capsule (200 mg total) by mouth at bedtime. Take as needed for cough     dextromethorphan-guaiFENesin 30-600 MG 12hr tablet  Commonly known as:  MUCINEX DM  Take 1 tablet by mouth every 12 (twelve) hours.     diazepam 10 MG tablet  Commonly known as:  VALIUM  Take 10 mg by mouth 3 (three) times daily.     doxazosin 1 MG tablet  Commonly known as:  CARDURA  Take 1 mg by mouth 3 (three) times daily.     FLUoxetine 20 MG tablet  Commonly known as:  PROZAC  Take 60 mg by mouth daily.     levofloxacin 500 MG tablet  Commonly known as:  LEVAQUIN  Take 1 tablet (500 mg total) by mouth daily.  Oxcarbazepine 300 MG tablet  Commonly known as:  TRILEPTAL  Take 300 mg by mouth 3 (three) times daily.     predniSONE 20 MG tablet  Commonly known as:  DELTASONE  Take 3 tablets (60 mg total) by mouth daily with breakfast.  Start taking on:  03/01/2015     QUEtiapine 200 MG tablet  Commonly known as:  SEROQUEL  Take 200 mg by mouth at bedtime.     trifluoperazine 2 MG tablet  Commonly known as:  STELAZINE  Take 2 mg by mouth 2 (two) times daily.     valACYclovir 1000 MG tablet  Commonly known as:  VALTREX  Take 1,000 mg by mouth daily.     zolpidem 10 MG tablet  Commonly known as:  AMBIEN  Take 10 mg by mouth at bedtime.       Allergies  Allergen Reactions  . Nsaids Other (See Comments)     Contraindicated with gastric bypass   Follow-up Information    Follow up with Follow up with your PCP in 1-2 weeks In 1 week.   Why:  Hospital follow up       The results of significant diagnostics from this hospitalization (including imaging, microbiology, ancillary and laboratory) are listed below for reference.    Significant Diagnostic Studies: Dg Chest 2 View  02/24/2015  CLINICAL DATA:  Cough, shortness of breath, and hypoxia for 6 days. EXAM: CHEST  2 VIEW COMPARISON:  01/01/2015 FINDINGS: The heart size and mediastinal contours are within normal limits. New mild airspace disease is seen in the right upper lobe, suspicious for pneumonia. There is also new linear opacity in the left upper lobe, consistent with atelectasis. No evidence of pleural effusion. IMPRESSION: Right upper lobe airspace disease, consistent with pneumonia. Mild subsegmental atelectasis also noted in left upper lobe. Electronically Signed   By: Myles Rosenthal M.D.   On: 02/24/2015 13:37    Microbiology: Recent Results (from the past 240 hour(s))  Blood culture (routine x 2)     Status: None   Collection Time: 02/24/15  3:38 PM  Result Value Ref Range Status   Specimen Description BLOOD LEFT AC  Final   Special Requests BOTTLES DRAWN AEROBIC AND ANAEROBIC 5 CC EACH  Final   Culture  Setup Time   Final    GRAM POSITIVE COCCI IN CLUSTERS ANAEROBIC BOTTLE ONLY CRITICAL RESULT CALLED TO, READ BACK BY AND VERIFIED WITH: Elenore Rota RN 2213 02/25/15 A BROWNING    Culture   Final    STAPHYLOCOCCUS SPECIES (COAGULASE NEGATIVE) THE SIGNIFICANCE OF ISOLATING THIS ORGANISM FROM A SINGLE SET OF BLOOD CULTURES WHEN MULTIPLE SETS ARE DRAWN IS UNCERTAIN. PLEASE NOTIFY THE MICROBIOLOGY DEPARTMENT WITHIN ONE WEEK IF SPECIATION AND SENSITIVITIES ARE REQUIRED. Performed at [facility]    Report Status 02/28/2015 FINAL  Final  Blood culture (routine x 2)     Status: None (Preliminary result)   Collection Time: 02/24/15   4:00 PM  Result Value Ref Range Status   Specimen Description BLOOD RIGHT [facility]  Final   Special Requests BOTTLES DRAWN AEROBIC AND ANAEROBIC 5CC EACH  Final   Culture   Final    NO GROWTH 3 DAYS Performed at [facility]    Report Status PENDING  Incomplete  Culture, sputum-assessment     Status: None   Collection Time: 02/25/15 12:10 AM  Result Value Ref Range Status   Specimen Description SPUTUM  Final   Special Requests NONE  Final  Sputum evaluation   Final    MICROSCOPIC FINDINGS SUGGEST THAT THIS SPECIMEN IS NOT REPRESENTATIVE OF LOWER RESPIRATORY SECRETIONS. PLEASE RECOLLECT. CALLED PAULINE/5W @0222  ON 02/25/15 BY KARCZEWSKI,S.    Report Status 02/25/2015 FINAL  Final     Labs: Basic Metabolic Panel:  Recent Labs Lab 02/24/15 1500 02/25/15 0510  NA 137 135  K 3.7 4.3  CL 106 104  CO2 23 23  GLUCOSE 137* 167*  BUN 11 9  CREATININE 0.53 0.59  CALCIUM 9.0 8.3*   Liver Function Tests:  Recent Labs Lab 02/24/15 1500 02/25/15 0510  AST 20 16  ALT 13* 13*  ALKPHOS 71 70  BILITOT 0.2* 0.5  PROT 7.1 6.9  ALBUMIN 3.6 3.4*   No results for input(s): LIPASE, AMYLASE in the last 168 hours. No results for input(s): AMMONIA in the last 168 hours. CBC:  Recent Labs Lab 02/24/15 1500 02/25/15 0510 02/26/15 0503 02/27/15 0830  WBC 14.5* 11.2* 14.7* 13.4*  NEUTROABS 12.7* 9.9*  --   --   HGB 13.2 12.4 12.2 12.8  HCT 39.1 37.2 36.7 38.0  MCV 92.0 93.9 94.1 93.4  PLT 286 296 298 297   Cardiac Enzymes:  Recent Labs Lab 02/24/15 1500  TROPONINI <0.03   BNP: BNP (last 3 results)  Recent Labs  01/01/15 1320  BNP 157.3*    ProBNP (last 3 results) No results for input(s): PROBNP in the last 8760 hours.  CBG:  Recent Labs Lab 02/27/15 0933 02/27/15 1514 02/27/15 1812 02/28/15 0852 02/28/15 1320  GLUCAP 129* 118* 162* 120* 144*    Signed:  CHIU, STEPHEN K  [facility] 02/28/2015, 1:45 PM

## 2015-03-01 LAB — HEMOGLOBIN A1C
HEMOGLOBIN A1C: 5.3 % (ref 4.8–5.6)
MEAN PLASMA GLUCOSE: 105 mg/dL

## 2015-03-01 LAB — CULTURE, BLOOD (ROUTINE X 2): Culture: NO GROWTH

## 2015-03-04 LAB — CULTURE, BLOOD (ROUTINE X 2)
CULTURE: NO GROWTH
Culture: NO GROWTH

## 2015-03-15 ENCOUNTER — Ambulatory Visit (INDEPENDENT_AMBULATORY_CARE_PROVIDER_SITE_OTHER): Payer: 59 | Admitting: Physician Assistant

## 2015-03-15 ENCOUNTER — Encounter: Payer: Self-pay | Admitting: Physician Assistant

## 2015-03-15 VITALS — BP 115/72 | HR 87 | Temp 98.2°F | Ht 64.0 in | Wt 272.0 lb

## 2015-03-15 DIAGNOSIS — F411 Generalized anxiety disorder: Secondary | ICD-10-CM

## 2015-03-15 DIAGNOSIS — J189 Pneumonia, unspecified organism: Secondary | ICD-10-CM

## 2015-03-15 DIAGNOSIS — F3113 Bipolar disorder, current episode manic without psychotic features, severe: Secondary | ICD-10-CM | POA: Diagnosis not present

## 2015-03-15 DIAGNOSIS — F17200 Nicotine dependence, unspecified, uncomplicated: Secondary | ICD-10-CM

## 2015-03-15 MED ORDER — NICOTINE 21 MG/24HR TD PT24: 21.0000 mg | MEDICATED_PATCH | Freq: Every day | TRANSDERMAL | Status: DC

## 2015-03-15 MED ORDER — ALPRAZOLAM 1 MG PO TABS: ORAL_TABLET | ORAL | Status: DC

## 2015-03-15 NOTE — Assessment & Plan Note (Signed)
Chantix not a good option giving history of significant bipolar disorder. Wellbutrin is a viable option but will let psychiatry assess if it can be added giving current multi-drug regimen. Will begin with Nicoderm patches. Follow-up 1 month.

## 2015-03-15 NOTE — Progress Notes (Signed)
Pre visit review using our clinic review tool, if applicable. No additional management support is needed unless otherwise documented below in the visit note. 

## 2015-03-15 NOTE — Assessment & Plan Note (Signed)
Will grant 2 tablets of Xanax to use before [place].

## 2015-03-15 NOTE — Progress Notes (Signed)
Patient presents to clinic today to establish care. Patient also presents for hospital follow-up of healthcare associated pneumonia.  Hospital course: Patient presented to ER on 02/24/15 with complaints of chest congestion and productive cough.  chest x-ray revealed pneumonia. Patient was noted to be hypoxic and thus was admitted to the hospital for observation. Patient stabilized and discharged with a course of Levaquin. Patient endorses completing entire course of antibiotic. Is feeling much better. Denies fever, chills, cough, chest congestion or shortness of breath.   Chronic Issues: Bipolar Disorder -- previously followed by psychiatry. Needs new specialist since moved to the area. Is currently on multidrug regimen including Elavil 25 mg 3 times daily, fluoxetine 60 mg daily, Seroquel 200 mg at bedtime. Valium 10 mg 3 times a day, Cardura 1 mg daily, and Stelazine 2 mg twice daily. Endorses has been doing very well on current regimen for several years.  Acute Concerns: Tobacco Use Disorder --currently smoking a quarter pack per day. Wishes to quit.   Patient with upcoming flight to [facility] tomorrow. Is one discussed options to help with acute anxiety as she has significant anxiety regarding getting on an airplane.  Past Medical History  Diagnosis Date  . Kidney stone   . Anxiety   . Bipolar affective disorder ([place])   . SBO (small bowel obstruction) ([place]) 01/2014  . Anemia   . Blood transfusion without reported diagnosis   . Depression     Current Outpatient Prescriptions on File Prior to Visit  Medication Sig Dispense Refill  . amitriptyline (ELAVIL) 25 MG tablet Take 25 mg by mouth 3 (three) times daily.    . diazepam (VALIUM) 10 MG tablet Take 10 mg by mouth 3 (three) times daily.    Marland Kitchen doxazosin (CARDURA) 1 MG tablet Take 1 mg by mouth 3 (three) times daily.     Marland Kitchen FLUoxetine (PROZAC) 20 MG tablet Take 60 mg by mouth daily.     . Oxcarbazepine (TRILEPTAL) 300 MG tablet Take 300  mg by mouth 3 (three) times daily.    . QUEtiapine (SEROQUEL) 200 MG tablet Take 200 mg by mouth at bedtime.    Marland Kitchen trifluoperazine (STELAZINE) 2 MG tablet Take 2 mg by mouth 2 (two) times daily.    . valACYclovir (VALTREX) 1000 MG tablet Take 1,000 mg by mouth daily.    Marland Kitchen zolpidem (AMBIEN) 10 MG tablet Take 10 mg by mouth at bedtime.     Marland Kitchen dextromethorphan-guaiFENesin (MUCINEX DM) 30-600 MG 12hr tablet Take 1 tablet by mouth every 12 (twelve) hours.     No current facility-administered medications on file prior to visit.    Allergies  Allergen Reactions  . Nsaids Other (See Comments)    Contraindicated with gastric bypass    Family History  Problem Relation Age of Onset  . Diabetes Mother   . Cancer Mother     Social History   Social History  . Marital Status: Single    Spouse Name: N/A  . Number of Children: N/A  . Years of Education: N/A   Social History Main Topics  . Smoking status: Current Every Day Smoker -- 0.25 packs/day    Last Attempt to Quit: 05/11/2014  . Smokeless tobacco: Never Used  . Alcohol Use: No  . Drug Use: No  . Sexual Activity: Yes    Birth Control/ Protection: Surgical   Other Topics Concern  . None   Social History Narrative   Review of Systems  Constitutional: Negative for fever and  weight loss.  HENT: Negative for ear discharge, ear pain, hearing loss and tinnitus.   Eyes: Negative for blurred vision, double vision, photophobia and pain.  Respiratory: Negative for cough and shortness of breath.   Cardiovascular: Negative for chest pain and palpitations.  Gastrointestinal: Negative for heartburn, nausea, vomiting, abdominal pain, diarrhea, constipation, blood in stool and melena.  Genitourinary: Negative for dysuria, urgency, frequency, hematuria and flank pain.  Musculoskeletal: Negative for falls.  Neurological: Negative for dizziness, loss of consciousness and headaches.  Endo/Heme/Allergies: Negative for environmental allergies.    Psychiatric/Behavioral: Negative for depression, suicidal ideas, hallucinations and substance abuse. The patient is nervous/anxious. The patient does not have insomnia.      BP 115/72 mmHg  Pulse 87  Temp(Src) 98.2 F (36.8 C) (Oral)  Ht _0  (1.626 m)  Wt 272 lb (123.378 kg)  BMI 46.67 kg/m2  SpO2 99%  Physical Exam  Constitutional: She is oriented to person, place, and time and well-developed, well-nourished, and in no distress.  HENT:  Head: Normocephalic and atraumatic.  Right Ear: External ear normal.  Left Ear: External ear normal.  Nose: Nose normal.  Mouth/Throat: Oropharynx is clear and moist. No oropharyngeal exudate.  TM within normal limits bilaterally.  Eyes: Conjunctivae are normal.  Neck: Neck supple.  Cardiovascular: Normal rate, regular rhythm, normal heart sounds and intact distal pulses.   Pulmonary/Chest: Effort normal and breath sounds normal. No respiratory distress. She has no wheezes. She has no rales. She exhibits no tenderness.  Neurological: She is alert and oriented to person, place, and time.  Skin: Skin is warm and dry. No rash noted.  Psychiatric: Affect normal.  Vitals reviewed.   Recent Results (from the past 2160 hour(s))  CBC with Differential/Platelet     Status: Abnormal   Collection Time: 01/01/15  1:20 PM  Result Value Ref Range   WBC 14.2 (H) 4.0 - 10.5 K/uL   RBC 4.31 3.87 - 5.11 MIL/uL   Hemoglobin 13.9 12.0 - 15.0 g/dL   HCT 40.8 36.0 - 46.0 %   MCV 94.7 78.0 - 100.0 fL   MCH 32.3 26.0 - 34.0 pg   MCHC 34.1 30.0 - 36.0 g/dL   RDW 12.4 11.5 - 15.5 %   Platelets 258 150 - 400 K/uL   Neutrophils Relative % 88 (H) 43 - 77 %   Neutro Abs 12.4 (H) 1.7 - 7.7 K/uL   Lymphocytes Relative 7 (L) 12 - 46 %   Lymphs Abs 1.0 0.7 - 4.0 K/uL   Monocytes Relative 5 3 - 12 %   Monocytes Absolute 0.8 0.1 - 1.0 K/uL   Eosinophils Relative 0 0 - 5 %   Eosinophils Absolute 0.0 0.0 - 0.7 K/uL   Basophils Relative 0 0 - 1 %   Basophils  Absolute 0.0 0.0 - 0.1 K/uL  Basic metabolic panel     Status: Abnormal   Collection Time: 01/01/15  1:20 PM  Result Value Ref Range   Sodium 138 135 - 145 mmol/L   Potassium 4.2 3.5 - 5.1 mmol/L   Chloride 104 101 - 111 mmol/L   CO2 24 22 - 32 mmol/L   Glucose, Bld 165 (H) 65 - 99 mg/dL   BUN 7 6 - 20 mg/dL   Creatinine, Ser 0.60 0.44 - 1.00 mg/dL   Calcium 8.8 (L) 8.9 - 10.3 mg/dL   GFR calc non Af Amer >60 >60 mL/min   GFR calc Af Amer >60 >60 mL/min    Comment: (  NOTE) The eGFR has been calculated using the CKD EPI equation. This calculation has not been validated in all clinical situations. eGFR's persistently <60 mL/min signify possible Chronic Kidney Disease.    Anion gap 10 5 - 15  Brain natriuretic peptide     Status: Abnormal   Collection Time: 01/01/15  1:20 PM  Result Value Ref Range   B Natriuretic Peptide 157.3 (H) 0.0 - 100.0 pg/mL  Troponin I     Status: None   Collection Time: 01/01/15  1:20 PM  Result Value Ref Range   Troponin I <0.03 <0.031 ng/mL    Comment:        NO INDICATION OF MYOCARDIAL INJURY.   CBC     Status: Abnormal   Collection Time: 01/01/15  7:55 PM  Result Value Ref Range   WBC 13.6 (H) 4.0 - 10.5 K/uL   RBC 4.31 3.87 - 5.11 MIL/uL   Hemoglobin 13.9 12.0 - 15.0 g/dL   HCT 40.4 36.0 - 46.0 %   MCV 93.7 78.0 - 100.0 fL   MCH 32.3 26.0 - 34.0 pg   MCHC 34.4 30.0 - 36.0 g/dL   RDW 13.0 11.5 - 15.5 %   Platelets 255 150 - 400 K/uL  Creatinine, serum     Status: None   Collection Time: 01/01/15  7:55 PM  Result Value Ref Range   Creatinine, Ser 0.76 0.44 - 1.00 mg/dL   GFR calc non Af Amer >60 >60 mL/min   GFR calc Af Amer >60 >60 mL/min    Comment: (NOTE) The eGFR has been calculated using the CKD EPI equation. This calculation has not been validated in all clinical situations. eGFR's persistently <60 mL/min signify possible Chronic Kidney Disease.   Comprehensive metabolic panel     Status: Abnormal   Collection Time: 01/02/15   5:22 AM  Result Value Ref Range   Sodium 138 135 - 145 mmol/L   Potassium 4.3 3.5 - 5.1 mmol/L   Chloride 103 101 - 111 mmol/L   CO2 25 22 - 32 mmol/L   Glucose, Bld 93 65 - 99 mg/dL   BUN 12 6 - 20 mg/dL   Creatinine, Ser 0.54 0.44 - 1.00 mg/dL   Calcium 9.0 8.9 - 10.3 mg/dL   Total Protein 6.2 (L) 6.5 - 8.1 g/dL   Albumin 3.3 (L) 3.5 - 5.0 g/dL   AST 23 15 - 41 U/L   ALT 17 14 - 54 U/L   Alkaline Phosphatase 67 38 - 126 U/L   Total Bilirubin 0.4 0.3 - 1.2 mg/dL   GFR calc non Af Amer >60 >60 mL/min   GFR calc Af Amer >60 >60 mL/min    Comment: (NOTE) The eGFR has been calculated using the CKD EPI equation. This calculation has not been validated in all clinical situations. eGFR's persistently <60 mL/min signify possible Chronic Kidney Disease.    Anion gap 10 5 - 15  CBC     Status: Abnormal   Collection Time: 01/02/15  5:22 AM  Result Value Ref Range   WBC 14.5 (H) 4.0 - 10.5 K/uL   RBC 4.11 3.87 - 5.11 MIL/uL   Hemoglobin 13.1 12.0 - 15.0 g/dL   HCT 38.8 36.0 - 46.0 %   MCV 94.4 78.0 - 100.0 fL   MCH 31.9 26.0 - 34.0 pg   MCHC 33.8 30.0 - 36.0 g/dL   RDW 13.3 11.5 - 15.5 %   Platelets 271 150 - 400 K/uL  CBC  Status: Abnormal   Collection Time: 01/04/15  5:33 AM  Result Value Ref Range   WBC 12.5 (H) 4.0 - 10.5 K/uL   RBC 4.33 3.87 - 5.11 MIL/uL   Hemoglobin 13.7 12.0 - 15.0 g/dL   HCT 40.4 36.0 - 46.0 %   MCV 93.3 78.0 - 100.0 fL   MCH 31.6 26.0 - 34.0 pg   MCHC 33.9 30.0 - 36.0 g/dL   RDW 13.0 11.5 - 15.5 %   Platelets 245 150 - 400 K/uL  Basic metabolic panel     Status: Abnormal   Collection Time: 01/04/15  5:33 AM  Result Value Ref Range   Sodium 139 135 - 145 mmol/L   Potassium 4.4 3.5 - 5.1 mmol/L   Chloride 102 101 - 111 mmol/L   CO2 27 22 - 32 mmol/L   Glucose, Bld 104 (H) 65 - 99 mg/dL   BUN 11 6 - 20 mg/dL   Creatinine, Ser 0.56 0.44 - 1.00 mg/dL   Calcium 9.2 8.9 - 10.3 mg/dL   GFR calc non Af Amer >60 >60 mL/min   GFR calc Af Amer >60  >60 mL/min    Comment: (NOTE) The eGFR has been calculated using the CKD EPI equation. This calculation has not been validated in all clinical situations. eGFR's persistently <60 mL/min signify possible Chronic Kidney Disease.    Anion gap 10 5 - 15  CBC w auto diff ([facility])     Status: None   Collection Time: 02/22/15  8:20 PM  Result Value Ref Range   WBC  4.5 - 10.5 K/uL    Comment: see scanned report   Lymphocytes relative %  15 - 45 %   Monocytes relative %  2 - 10 %   Neutrophils relative % (GR)  44 - 76 %   Lymphocytes absolute  0.1 - 1.8 K/uL   Monocyes absolute  0.1 - 1 K/uL   Neutrophils absolute (GR#)  1.7 - 7.8 K/uL   RBC  3.8 - 5.1 MIL/uL   Hemoglobin  11.8 - 15.5 g/dL   Hematocrit  34.8 - 46 %   MCV  78 - 100 fL   MCH  26 - 32 pg   MCHC  32 - 36.5 g/dL   RDW  11.6 - 14 %   Platelet count  140 - 400 K/uL   MPV  7.8 - 11 fL  CBC with Differential     Status: Abnormal   Collection Time: 02/24/15  3:00 PM  Result Value Ref Range   WBC 14.5 (H) 4.0 - 10.5 K/uL   RBC 4.25 3.87 - 5.11 MIL/uL   Hemoglobin 13.2 12.0 - 15.0 g/dL   HCT 39.1 36.0 - 46.0 %   MCV 92.0 78.0 - 100.0 fL   MCH 31.1 26.0 - 34.0 pg   MCHC 33.8 30.0 - 36.0 g/dL   RDW 12.4 11.5 - 15.5 %   Platelets 286 150 - 400 K/uL   Neutrophils Relative % 87 %   Neutro Abs 12.7 (H) 1.7 - 7.7 K/uL   Lymphocytes Relative 8 %   Lymphs Abs 1.2 0.7 - 4.0 K/uL   Monocytes Relative 5 %   Monocytes Absolute 0.7 0.1 - 1.0 K/uL   Eosinophils Relative 0 %   Eosinophils Absolute 0.0 0.0 - 0.7 K/uL   Basophils Relative 0 %   Basophils Absolute 0.0 0.0 - 0.1 K/uL  Comprehensive metabolic panel     Status: Abnormal  Collection Time: 02/24/15  3:00 PM  Result Value Ref Range   Sodium 137 135 - 145 mmol/L   Potassium 3.7 3.5 - 5.1 mmol/L   Chloride 106 101 - 111 mmol/L   CO2 23 22 - 32 mmol/L   Glucose, Bld 137 (H) 65 - 99 mg/dL   BUN 11 6 - 20 mg/dL   Creatinine, Ser 0.53 0.44 - 1.00 mg/dL    Calcium 9.0 8.9 - 10.3 mg/dL   Total Protein 7.1 6.5 - 8.1 g/dL   Albumin 3.6 3.5 - 5.0 g/dL   AST 20 15 - 41 U/L   ALT 13 (L) 14 - 54 U/L   Alkaline Phosphatase 71 38 - 126 U/L   Total Bilirubin 0.2 (L) 0.3 - 1.2 mg/dL   GFR calc non Af Amer >60 >60 mL/min   GFR calc Af Amer >60 >60 mL/min    Comment: (NOTE) The eGFR has been calculated using the CKD EPI equation. This calculation has not been validated in all clinical situations. eGFR's persistently <60 mL/min signify possible Chronic Kidney Disease.    Anion gap 8 5 - 15  Troponin I     Status: None   Collection Time: 02/24/15  3:00 PM  Result Value Ref Range   Troponin I <0.03 <0.031 ng/mL    Comment:        NO INDICATION OF MYOCARDIAL INJURY.   Blood culture (routine x 2)     Status: None   Collection Time: 02/24/15  3:38 PM  Result Value Ref Range   Specimen Description BLOOD LEFT AC    Special Requests BOTTLES DRAWN AEROBIC AND ANAEROBIC 5 CC EACH    Culture  Setup Time      GRAM POSITIVE COCCI IN CLUSTERS ANAEROBIC BOTTLE ONLY CRITICAL RESULT CALLED TO, READ BACK BY AND VERIFIED WITH: Julious Oka RN [id number] 02/25/15 A BROWNING    Culture      STAPHYLOCOCCUS SPECIES (COAGULASE NEGATIVE) THE SIGNIFICANCE OF ISOLATING THIS ORGANISM FROM A SINGLE SET OF BLOOD CULTURES WHEN MULTIPLE SETS ARE DRAWN IS UNCERTAIN. PLEASE NOTIFY THE MICROBIOLOGY DEPARTMENT WITHIN ONE WEEK IF SPECIATION AND SENSITIVITIES ARE REQUIRED. Performed at [facility]    Report Status 02/28/2015 FINAL   I-Stat CG4 Lactic Acid, ED     Status: Abnormal   Collection Time: 02/24/15  3:44 PM  Result Value Ref Range   Lactic Acid, Venous 2.53 (HH) 0.5 - 2.0 mmol/L   Comment NOTIFIED PHYSICIAN   Blood culture (routine x 2)     Status: None   Collection Time: 02/24/15  4:00 PM  Result Value Ref Range   Specimen Description BLOOD RIGHT AC    Special Requests BOTTLES DRAWN AEROBIC AND ANAEROBIC 5CC EACH    Culture      NO GROWTH 5 DAYS Performed at  [facility]    Report Status 03/01/2015 FINAL   Influenza panel by PCR (type A & B, H1N1)     Status: None   Collection Time: 02/24/15 11:13 PM  Result Value Ref Range   Influenza A By PCR NEGATIVE NEGATIVE   Influenza B By PCR NEGATIVE NEGATIVE   H1N1 flu by pcr NOT DETECTED NOT DETECTED    Comment:        The Xpert Flu assay (FDA approved for nasal aspirates or washes and nasopharyngeal swab specimens), is intended as an aid in the diagnosis of influenza and should not be used as a sole basis for treatment. Performed at [facility]   Glucose, capillary     Status: None   Collection Time: 02/24/15 11:51 PM  Result Value Ref Range   Glucose-Capillary 92 65 - 99 mg/dL   Comment 1 Notify RN   Culture, sputum-assessment     Status: None   Collection Time: 02/25/15 12:10 AM  Result Value Ref Range   Specimen Description SPUTUM    Special Requests NONE    Sputum evaluation      MICROSCOPIC FINDINGS SUGGEST THAT THIS SPECIMEN IS NOT REPRESENTATIVE OF LOWER RESPIRATORY SECRETIONS. PLEASE RECOLLECT. CALLED PAULINE/5W _0  ON 02/25/15 BY KARCZEWSKI,S.    Report Status 02/25/2015 FINAL   Strep pneumoniae urinary antigen  (not at [facility])     Status: None   Collection Time: 02/25/15  1:35 AM  Result Value Ref Range   Strep Pneumo Urinary Antigen NEGATIVE NEGATIVE    Comment:        Infection due to S. pneumoniae cannot be absolutely ruled out since the antigen present may be below the detection limit of the test.   Comprehensive metabolic panel     Status: Abnormal   Collection Time: 02/25/15  5:10 AM  Result Value Ref Range   Sodium 135 135 - 145 mmol/L   Potassium 4.3 3.5 - 5.1 mmol/L   Chloride 104 101 - 111 mmol/L   CO2 23 22 - 32 mmol/L   Glucose, Bld 167 (H) 65 - 99 mg/dL   BUN 9 6 - 20 mg/dL   Creatinine, Ser 0.59 0.44 - 1.00 mg/dL   Calcium 8.3 (L) 8.9 - 10.3 mg/dL   Total Protein 6.9 6.5 - 8.1 g/dL   Albumin 3.4 (L) 3.5 - 5.0 g/dL   AST 16 15 - 41  U/L   ALT 13 (L) 14 - 54 U/L   Alkaline Phosphatase 70 38 - 126 U/L   Total Bilirubin 0.5 0.3 - 1.2 mg/dL   GFR calc non Af Amer >60 >60 mL/min   GFR calc Af Amer >60 >60 mL/min    Comment: (NOTE) The eGFR has been calculated using the CKD EPI equation. This calculation has not been validated in all clinical situations. eGFR's persistently <60 mL/min signify possible Chronic Kidney Disease.    Anion gap 8 5 - 15  CBC WITH DIFFERENTIAL     Status: Abnormal   Collection Time: 02/25/15  5:10 AM  Result Value Ref Range   WBC 11.2 (H) 4.0 - 10.5 K/uL   RBC 3.96 3.87 - 5.11 MIL/uL   Hemoglobin 12.4 12.0 - 15.0 g/dL   HCT 37.2 36.0 - 46.0 %   MCV 93.9 78.0 - 100.0 fL   MCH 31.3 26.0 - 34.0 pg   MCHC 33.3 30.0 - 36.0 g/dL   RDW 12.9 11.5 - 15.5 %   Platelets 296 150 - 400 K/uL   Neutrophils Relative % 88 %   Neutro Abs 9.9 (H) 1.7 - 7.7 K/uL   Lymphocytes Relative 9 %   Lymphs Abs 1.0 0.7 - 4.0 K/uL   Monocytes Relative 2 %   Monocytes Absolute 0.2 0.1 - 1.0 K/uL   Eosinophils Relative 1 %   Eosinophils Absolute 0.1 0.0 - 0.7 K/uL   Basophils Relative 0 %   Basophils Absolute 0.0 0.0 - 0.1 K/uL  HIV antibody     Status: None   Collection Time: 02/25/15  5:10 AM  Result Value Ref Range   HIV Screen 4th Generation wRfx Non Reactive Non Reactive    Comment: (NOTE) Performed  At: [facility] [place], [place] [id number] Lindon Romp MD [id number]   Glucose, capillary     Status: Abnormal   Collection Time: 02/25/15  7:49 PM  Result Value Ref Range   Glucose-Capillary 369 (H) 65 - 99 mg/dL  Glucose, capillary     Status: Abnormal   Collection Time: 02/25/15  8:07 PM  Result Value Ref Range   Glucose-Capillary 388 (H) 65 - 99 mg/dL  Ketones, urine     Status: None   Collection Time: 02/25/15  8:20 PM  Result Value Ref Range   Ketones, ur NEGATIVE NEGATIVE mg/dL  Glucose, capillary     Status: Abnormal   Collection Time: 02/25/15  9:01 PM    Result Value Ref Range   Glucose-Capillary 323 (H) 65 - 99 mg/dL  Glucose, capillary     Status: Abnormal   Collection Time: 02/25/15  9:59 PM  Result Value Ref Range   Glucose-Capillary 293 (H) 65 - 99 mg/dL   Comment 1 Document in Chart   CBC     Status: Abnormal   Collection Time: 02/26/15  5:03 AM  Result Value Ref Range   WBC 14.7 (H) 4.0 - 10.5 K/uL   RBC 3.90 3.87 - 5.11 MIL/uL   Hemoglobin 12.2 12.0 - 15.0 g/dL   HCT 36.7 36.0 - 46.0 %   MCV 94.1 78.0 - 100.0 fL   MCH 31.3 26.0 - 34.0 pg   MCHC 33.2 30.0 - 36.0 g/dL   RDW 12.9 11.5 - 15.5 %   Platelets 298 150 - 400 K/uL  Lactic acid, plasma     Status: Abnormal   Collection Time: 02/26/15  5:03 AM  Result Value Ref Range   Lactic Acid, Venous 2.2 (HH) 0.5 - 2.0 mmol/L    Comment: CRITICAL RESULT CALLED TO, READ BACK BY AND VERIFIED WITH: HOGAN,M/5W _0  ON 02/26/15 BY KARCZEWSKI,S.   Hemoglobin A1c     Status: None   Collection Time: 02/26/15  5:03 AM  Result Value Ref Range   Hgb A1c MFr Bld 5.3 4.8 - 5.6 %    Comment: (NOTE)         Pre-diabetes: 5.7 - 6.4         Diabetes: >6.4         Glycemic control for adults with diabetes: <7.0    Mean Plasma Glucose 105 mg/dL    Comment: (NOTE) Performed At: [facility] [place], [place] [id number] Lindon Romp MD [id number]   Glucose, capillary     Status: Abnormal   Collection Time: 02/26/15  6:41 AM  Result Value Ref Range   Glucose-Capillary 185 (H) 65 - 99 mg/dL  Glucose, capillary     Status: Abnormal   Collection Time: 02/26/15  8:43 AM  Result Value Ref Range   Glucose-Capillary 162 (H) 65 - 99 mg/dL  Glucose, capillary     Status: Abnormal   Collection Time: 02/26/15 12:26 PM  Result Value Ref Range   Glucose-Capillary 299 (H) 65 - 99 mg/dL  Glucose, capillary     Status: Abnormal   Collection Time: 02/26/15  4:36 PM  Result Value Ref Range   Glucose-Capillary 249 (H) 65 - 99 mg/dL  Vancomycin, trough     Status: None    Collection Time: 02/26/15  5:40 PM  Result Value Ref Range   Vancomycin Tr 11 10.0 - 20.0 ug/mL  Glucose, capillary     Status: Abnormal   Collection  Time: 02/26/15  9:46 PM  Result Value Ref Range   Glucose-Capillary 220 (H) 65 - 99 mg/dL  CBC     Status: Abnormal   Collection Time: 02/27/15  8:30 AM  Result Value Ref Range   WBC 13.4 (H) 4.0 - 10.5 K/uL   RBC 4.07 3.87 - 5.11 MIL/uL   Hemoglobin 12.8 12.0 - 15.0 g/dL   HCT 38.0 36.0 - 46.0 %   MCV 93.4 78.0 - 100.0 fL   MCH 31.4 26.0 - 34.0 pg   MCHC 33.7 30.0 - 36.0 g/dL   RDW 12.7 11.5 - 15.5 %   Platelets 297 150 - 400 K/uL  Lactic acid, plasma     Status: Abnormal   Collection Time: 02/27/15  8:30 AM  Result Value Ref Range   Lactic Acid, Venous 2.3 (HH) 0.5 - 2.0 mmol/L    Comment: CRITICAL RESULT CALLED TO, READ BACK BY AND VERIFIED WITH: P.ARMSTRONG AT 0904 ON 02/27/15 BY S.VANHOORNE   Culture, blood (routine x 2)     Status: None   Collection Time: 02/27/15  8:35 AM  Result Value Ref Range   Specimen Description RIGHT ANTECUBITAL    Special Requests BOTTLES DRAWN AEROBIC AND ANAEROBIC 10CC    Culture      NO GROWTH 5 DAYS Performed at [facility]    Report Status 03/04/2015 FINAL   Culture, blood (routine x 2)     Status: None   Collection Time: 02/27/15  8:41 AM  Result Value Ref Range   Specimen Description BLOOD RIGHT HAND    Special Requests BOTTLES DRAWN AEROBIC AND ANAEROBIC 10CC    Culture      NO GROWTH 5 DAYS Performed at [facility]    Report Status 03/04/2015 FINAL   Glucose, capillary     Status: Abnormal   Collection Time: 02/27/15  9:33 AM  Result Value Ref Range   Glucose-Capillary 129 (H) 65 - 99 mg/dL  Glucose, capillary     Status: Abnormal   Collection Time: 02/27/15  3:14 PM  Result Value Ref Range   Glucose-Capillary 118 (H) 65 - 99 mg/dL  Glucose, capillary     Status: Abnormal   Collection Time: 02/27/15  6:12 PM  Result Value Ref Range   Glucose-Capillary  162 (H) 65 - 99 mg/dL  Lactic acid, plasma     Status: None   Collection Time: 02/28/15  6:12 AM  Result Value Ref Range   Lactic Acid, Venous 1.4 0.5 - 2.0 mmol/L  Glucose, capillary     Status: Abnormal   Collection Time: 02/28/15  8:52 AM  Result Value Ref Range   Glucose-Capillary 120 (H) 65 - 99 mg/dL  Glucose, capillary     Status: Abnormal   Collection Time: 02/28/15  1:20 PM  Result Value Ref Range   Glucose-Capillary 144 (H) 65 - 99 mg/dL    Assessment/Plan: Tobacco use disorder Chantix not a good option giving history of significant bipolar disorder. Wellbutrin is a viable option but will let psychiatry assess if it can be added giving current multi-drug regimen. Will begin with Nicoderm patches. Follow-up 1 month.  HCAP (healthcare-associated pneumonia) Clinically resolved. Vitals stable. Exam unremarkable. Supportive measures discussed. Follow-up if there is a recurrence of any symptoms.  Bipolar affective disorder (HCC) Multi-drug regimen. Patient endorses well-controlled presently. She is to set up appointment with a new Psychiatrist.  Anxiety state Will grant 2 tablets of Xanax to use before plane ride to and from  [place].

## 2015-03-15 NOTE — Assessment & Plan Note (Signed)
Clinically resolved. Vitals stable. Exam unremarkable. Supportive measures discussed. Follow-up if there is a recurrence of any symptoms.

## 2015-03-15 NOTE — Patient Instructions (Signed)
Please stay well hydrated and get plenty of rest. Continue chronic medications as directed. Schedule an appointment with psychiatry for management of your bipolar disorder.  Please take the Xanax as directed prior to your plane rides.   Please use nicotine patches as directed. Follow-up 1 month.  Don't forget to get some Flonase before your plane ride. Use as directed.

## 2015-03-15 NOTE — Assessment & Plan Note (Signed)
Multi-drug regimen. Patient endorses well-controlled presently. She is to set up appointment with a new Psychiatrist.

## 2015-04-14 ENCOUNTER — Encounter: Payer: Self-pay | Admitting: Physician Assistant

## 2015-04-14 ENCOUNTER — Ambulatory Visit (HOSPITAL_BASED_OUTPATIENT_CLINIC_OR_DEPARTMENT_OTHER)
Admission: RE | Admit: 2015-04-14 | Discharge: 2015-04-14 | Disposition: A | Discharge status: Home / Self Care | Payer: 59 | Source: Ambulatory Visit | Attending: Physician Assistant | Admitting: Physician Assistant

## 2015-04-14 ENCOUNTER — Ambulatory Visit (INDEPENDENT_AMBULATORY_CARE_PROVIDER_SITE_OTHER): Payer: 59 | Admitting: Physician Assistant

## 2015-04-14 VITALS — BP 114/72 | HR 97 | Temp 98.4°F | Resp 16 | Ht 64.0 in | Wt 270.1 lb

## 2015-04-14 DIAGNOSIS — M722 Plantar fascial fibromatosis: Secondary | ICD-10-CM | POA: Diagnosis not present

## 2015-04-14 DIAGNOSIS — M25561 Pain in right knee: Secondary | ICD-10-CM

## 2015-04-14 DIAGNOSIS — G8929 Other chronic pain: Secondary | ICD-10-CM

## 2015-04-14 LAB — TSH: TSH: 1.8 u[IU]/mL (ref 0.35–4.50)

## 2015-04-14 MED ORDER — METHYLPREDNISOLONE 4 MG PO TBPK: ORAL_TABLET | ORAL | Status: DC

## 2015-04-14 NOTE — Patient Instructions (Addendum)
Please go downstairs for an x-ray. I will call you with your results.   For feet, wear shoes with good arch support. This will keep weight off of heels.  Do the cold can exercises we talked about. Take steroid as directed to reduce inflammation.  Extra strength tylenol for breakthrough pain.  For weight, we are checking TSH. You are not eating enough to keep your metabolism running. Eat a healthy meal or snack every 3 hours to keep metabolism running to help with weight.   Continue medications as directed.

## 2015-04-14 NOTE — Progress Notes (Signed)
Pre visit review using our clinic review tool, if applicable. No additional management support is needed unless otherwise documented below in the visit note/SLS  

## 2015-04-14 NOTE — Progress Notes (Signed)
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 Patient denies change to diet. Typical diet for her includes redbull or water to drink, 1 small meal per day and a piece of cheese as a snack. Denies exercise regimen. Denies personal history of thyroid disorder.  Patient also c/o pain in bilateral heels, worse with first step out of bed in the morning or after prolonged sitting. Denies numbness, tingling. Denies radiation of pain. Denies history of heel spur. Has not taken anything for symptoms.  Patient c/o pain and popping of right knee with extension from flexed position.  Endorses knee buckling. Endorses symptoms have been more severe and frequent over the past 2 months.   Past Medical History  Diagnosis Date  . Kidney stone   . Anxiety   . Bipolar affective disorder (HCC)   . SBO (small bowel obstruction) (HCC) 01/2014  . Anemia   . Blood transfusion without reported diagnosis   . Depression     Current Outpatient Prescriptions on File Prior to Visit  Medication Sig Dispense Refill  . ALPRAZolam (XANAX) 1 MG tablet Take 1 tablet by mouth 30 minutes before each plane ride 2 tablet 0  . amitriptyline (ELAVIL) 25 MG tablet Take 25 mg by mouth 3 (three) times daily.    . diazepam (VALIUM) 10 MG tablet Take 10 mg by mouth 3 (three) times daily.    Marland Kitchen doxazosin (CARDURA) 1 MG tablet Take 1 mg by mouth 3 (three) times daily.     Marland Kitchen FLUoxetine (PROZAC) 20 MG tablet Take 60 mg by mouth daily.     . Oxcarbazepine (TRILEPTAL) 300 MG tablet Take 300 mg by mouth 3 (three) times daily.    . QUEtiapine (SEROQUEL) 200 MG tablet Take 200 mg by mouth at bedtime.    Marland Kitchen trifluoperazine (STELAZINE) 2 MG tablet Take 2 mg by mouth 2 (two) times daily.    . valACYclovir (VALTREX) 1000 MG tablet Take 1,000 mg by mouth daily.    Marland Kitchen zolpidem (AMBIEN) 10 MG tablet Take 10 mg by mouth at bedtime.     . nicotine (NICODERM CQ)  21 mg/24hr patch Place 1 patch (21 mg total) onto the skin daily. (Patient not taking: Reported on 04/14/2015) 28 patch 0   No current facility-administered medications on file prior to visit.    Allergies  Allergen Reactions  . Nsaids Other (See Comments)    Contraindicated with gastric bypass    Family History  Problem Relation Age of Onset  . Diabetes Mother   . Cancer Mother     Social History   Social History  . Marital Status: Single    Spouse Name: N/A  . Number of Children: N/A  . Years of Education: N/A   Social History Main Topics  . Smoking status: Current Every Day Smoker -- 0.25 packs/day    Last Attempt to Quit: 05/11/2014  . Smokeless tobacco: Never Used  . Alcohol Use: No  . Drug Use: No  . Sexual Activity: Yes    Birth Control/ Protection: Surgical   Other Topics Concern  . None   Social History Narrative   Review of Systems - See HPI.  All other ROS are negative.  BP 114/72 mmHg  Pulse 97  Temp(Src) 98.4 F (36.9 C) (Oral)  Resp 16  Ht 5\' 4"  (1.626 m)  Wt 270 lb 2 oz (122.528 kg)  BMI 46.34  kg/m2  SpO2 100%  Physical Exam  Constitutional: She is oriented to person, place, and time and well-developed, well-nourished, and in no distress.  HENT:  Head: Normocephalic and atraumatic.  Eyes: Conjunctivae are normal.  Neck: Neck supple. No thyromegaly present.  Cardiovascular: Normal rate, regular rhythm, normal heart sounds and intact distal pulses.   Pulmonary/Chest: Effort normal and breath sounds normal. No respiratory distress. She has no wheezes. She has no rales. She exhibits no tenderness.  Musculoskeletal:       Right knee: She exhibits decreased range of motion. She exhibits no swelling. Tenderness found.       Right foot: There is tenderness.       Left foot: There is tenderness.  Neurological: She is alert and oriented to person, place, and time.  Skin: Skin is warm and dry. No rash noted.  Psychiatric: Affect normal.     Recent Results (from the past 2160 hour(s))  CBC w auto diff ([facility])     Status: None   Collection Time: 02/22/15  8:20 PM  Result Value Ref Range   WBC  4.5 - 10.5 K/uL    Comment: see scanned report   Lymphocytes relative %  15 - 45 %   Monocytes relative %  2 - 10 %   Neutrophils relative % (GR)  44 - 76 %   Lymphocytes absolute  0.1 - 1.8 K/uL   Monocyes absolute  0.1 - 1 K/uL   Neutrophils absolute (GR#)  1.7 - 7.8 K/uL   RBC  3.8 - 5.1 MIL/uL   Hemoglobin  11.8 - 15.5 g/dL   Hematocrit  88.8 - 46 %   MCV  78 - 100 fL   MCH  26 - 32 pg   MCHC  32 - 36.5 g/dL   RDW  83.8 - 14 %   Platelet count  140 - 400 K/uL   MPV  7.8 - 11 fL  CBC with Differential     Status: Abnormal   Collection Time: 02/24/15  3:00 PM  Result Value Ref Range   WBC 14.5 (H) 4.0 - 10.5 K/uL   RBC 4.25 3.87 - 5.11 MIL/uL   Hemoglobin 13.2 12.0 - 15.0 g/dL   HCT 95.3 61.5 - 33.1 %   MCV 92.0 78.0 - 100.0 fL   MCH 31.1 26.0 - 34.0 pg   MCHC 33.8 30.0 - 36.0 g/dL   RDW 92.3 91.9 - 76.9 %   Platelets 286 150 - 400 K/uL   Neutrophils Relative % 87 %   Neutro Abs 12.7 (H) 1.7 - 7.7 K/uL   Lymphocytes Relative 8 %   Lymphs Abs 1.2 0.7 - 4.0 K/uL   Monocytes Relative 5 %   Monocytes Absolute 0.7 0.1 - 1.0 K/uL   Eosinophils Relative 0 %   Eosinophils Absolute 0.0 0.0 - 0.7 K/uL   Basophils Relative 0 %   Basophils Absolute 0.0 0.0 - 0.1 K/uL  Comprehensive metabolic panel     Status: Abnormal   Collection Time: 02/24/15  3:00 PM  Result Value Ref Range   Sodium 137 135 - 145 mmol/L   Potassium 3.7 3.5 - 5.1 mmol/L   Chloride 106 101 - 111 mmol/L   CO2 23 22 - 32 mmol/L   Glucose, Bld 137 (H) 65 - 99 mg/dL   BUN 11 6 - 20 mg/dL   Creatinine, Ser 4.50 0.44 - 1.00 mg/dL   Calcium 9.0 8.9 - 92.0 mg/dL  Total Protein 7.1 6.5 - 8.1 g/dL   Albumin 3.6 3.5 - 5.0 g/dL   AST 20 15 - 41 U/L   ALT 13 (L) 14 - 54 U/L   Alkaline Phosphatase 71 38 - 126 U/L   Total Bilirubin 0.2 (L)  0.3 - 1.2 mg/dL   GFR calc non Af Amer >60 >60 mL/min   GFR calc Af Amer >60 >60 mL/min    Comment: (NOTE) The eGFR has been calculated using the CKD EPI equation. This calculation has not been validated in all clinical situations. eGFR's persistently <60 mL/min signify possible Chronic Kidney Disease.    Anion gap 8 5 - 15  Troponin I     Status: None   Collection Time: 02/24/15  3:00 PM  Result Value Ref Range   Troponin I <0.03 <0.031 ng/mL    Comment:        NO INDICATION OF MYOCARDIAL INJURY.   Blood culture (routine x 2)     Status: None   Collection Time: 02/24/15  3:38 PM  Result Value Ref Range   Specimen Description BLOOD LEFT AC    Special Requests BOTTLES DRAWN AEROBIC AND ANAEROBIC 5 CC EACH    Culture  Setup Time      GRAM POSITIVE COCCI IN CLUSTERS ANAEROBIC BOTTLE ONLY CRITICAL RESULT CALLED TO, READ BACK BY AND VERIFIED WITH: Julious Oka RN [id number] 02/25/15 A BROWNING    Culture      STAPHYLOCOCCUS SPECIES (COAGULASE NEGATIVE) THE SIGNIFICANCE OF ISOLATING THIS ORGANISM FROM A SINGLE SET OF BLOOD CULTURES WHEN MULTIPLE SETS ARE DRAWN IS UNCERTAIN. PLEASE NOTIFY THE MICROBIOLOGY DEPARTMENT WITHIN ONE WEEK IF SPECIATION AND SENSITIVITIES ARE REQUIRED. Performed at [facility]    Report Status 02/28/2015 FINAL   I-Stat CG4 Lactic Acid, ED     Status: Abnormal   Collection Time: 02/24/15  3:44 PM  Result Value Ref Range   Lactic Acid, Venous 2.53 (HH) 0.5 - 2.0 mmol/L   Comment NOTIFIED PHYSICIAN   Blood culture (routine x 2)     Status: None   Collection Time: 02/24/15  4:00 PM  Result Value Ref Range   Specimen Description BLOOD RIGHT AC    Special Requests BOTTLES DRAWN AEROBIC AND ANAEROBIC 5CC EACH    Culture      NO GROWTH 5 DAYS Performed at [facility]    Report Status 03/01/2015 FINAL   Influenza panel by PCR (type A & B, H1N1)     Status: None   Collection Time: 02/24/15 11:13 PM  Result Value Ref Range   Influenza A By PCR NEGATIVE  NEGATIVE   Influenza B By PCR NEGATIVE NEGATIVE   H1N1 flu by pcr NOT DETECTED NOT DETECTED    Comment:        The Xpert Flu assay (FDA approved for nasal aspirates or washes and nasopharyngeal swab specimens), is intended as an aid in the diagnosis of influenza and should not be used as a sole basis for treatment. Performed at [facility]   Glucose, capillary     Status: None   Collection Time: 02/24/15 11:51 PM  Result Value Ref Range   Glucose-Capillary 92 65 - 99 mg/dL   Comment 1 Notify RN   Culture, sputum-assessment     Status: None   Collection Time: 02/25/15 12:10 AM  Result Value Ref Range   Specimen Description SPUTUM    Special Requests NONE    Sputum evaluation  MICROSCOPIC FINDINGS SUGGEST THAT THIS SPECIMEN IS NOT REPRESENTATIVE OF LOWER RESPIRATORY SECRETIONS. PLEASE RECOLLECT. CALLED PAULINE/5W $RemoveBeforeDEI'[id number]$  ON 02/25/15 BY KARCZEWSKI,S.    Report Status 02/25/2015 FINAL   Strep pneumoniae urinary antigen  (not at [facility])     Status: None   Collection Time: 02/25/15  1:35 AM  Result Value Ref Range   Strep Pneumo Urinary Antigen NEGATIVE NEGATIVE    Comment:        Infection due to S. pneumoniae cannot be absolutely ruled out since the antigen present may be below the detection limit of the test.   Comprehensive metabolic panel     Status: Abnormal   Collection Time: 02/25/15  5:10 AM  Result Value Ref Range   Sodium 135 135 - 145 mmol/L   Potassium 4.3 3.5 - 5.1 mmol/L   Chloride 104 101 - 111 mmol/L   CO2 23 22 - 32 mmol/L   Glucose, Bld 167 (H) 65 - 99 mg/dL   BUN 9 6 - 20 mg/dL   Creatinine, Ser 0.59 0.44 - 1.00 mg/dL   Calcium 8.3 (L) 8.9 - 10.3 mg/dL   Total Protein 6.9 6.5 - 8.1 g/dL   Albumin 3.4 (L) 3.5 - 5.0 g/dL   AST 16 15 - 41 U/L   ALT 13 (L) 14 - 54 U/L   Alkaline Phosphatase 70 38 - 126 U/L   Total Bilirubin 0.5 0.3 - 1.2 mg/dL   GFR calc non Af Amer >60 >60 mL/min   GFR calc Af Amer >60 >60 mL/min    Comment: (NOTE) The eGFR  has been calculated using the CKD EPI equation. This calculation has not been validated in all clinical situations. eGFR's persistently <60 mL/min signify possible Chronic Kidney Disease.    Anion gap 8 5 - 15  CBC WITH DIFFERENTIAL     Status: Abnormal   Collection Time: 02/25/15  5:10 AM  Result Value Ref Range   WBC 11.2 (H) 4.0 - 10.5 K/uL   RBC 3.96 3.87 - 5.11 MIL/uL   Hemoglobin 12.4 12.0 - 15.0 g/dL   HCT 37.2 36.0 - 46.0 %   MCV 93.9 78.0 - 100.0 fL   MCH 31.3 26.0 - 34.0 pg   MCHC 33.3 30.0 - 36.0 g/dL   RDW 12.9 11.5 - 15.5 %   Platelets 296 150 - 400 K/uL   Neutrophils Relative % 88 %   Neutro Abs 9.9 (H) 1.7 - 7.7 K/uL   Lymphocytes Relative 9 %   Lymphs Abs 1.0 0.7 - 4.0 K/uL   Monocytes Relative 2 %   Monocytes Absolute 0.2 0.1 - 1.0 K/uL   Eosinophils Relative 1 %   Eosinophils Absolute 0.1 0.0 - 0.7 K/uL   Basophils Relative 0 %   Basophils Absolute 0.0 0.0 - 0.1 K/uL  HIV antibody     Status: None   Collection Time: 02/25/15  5:10 AM  Result Value Ref Range   HIV Screen 4th Generation wRfx Non Reactive Non Reactive    Comment: (NOTE) Performed At: [facility] [address] [place], [place] [id number] Lindon Romp MD EH:[id number]   Glucose, capillary     Status: Abnormal   Collection Time: 02/25/15  7:49 PM  Result Value Ref Range   Glucose-Capillary 369 (H) 65 - 99 mg/dL  Glucose, capillary     Status: Abnormal   Collection Time: 02/25/15  8:07 PM  Result Value Ref Range   Glucose-Capillary 388 (H) 65 - 99 mg/dL  Ketones,  urine     Status: None   Collection Time: 02/25/15  8:20 PM  Result Value Ref Range   Ketones, ur NEGATIVE NEGATIVE mg/dL  Glucose, capillary     Status: Abnormal   Collection Time: 02/25/15  9:01 PM  Result Value Ref Range   Glucose-Capillary 323 (H) 65 - 99 mg/dL  Glucose, capillary     Status: Abnormal   Collection Time: 02/25/15  9:59 PM  Result Value Ref Range   Glucose-Capillary 293 (H) 65 - 99 mg/dL    Comment 1 Document in Chart   CBC     Status: Abnormal   Collection Time: 02/26/15  5:03 AM  Result Value Ref Range   WBC 14.7 (H) 4.0 - 10.5 K/uL   RBC 3.90 3.87 - 5.11 MIL/uL   Hemoglobin 12.2 12.0 - 15.0 g/dL   HCT 36.7 36.0 - 46.0 %   MCV 94.1 78.0 - 100.0 fL   MCH 31.3 26.0 - 34.0 pg   MCHC 33.2 30.0 - 36.0 g/dL   RDW 12.9 11.5 - 15.5 %   Platelets 298 150 - 400 K/uL  Lactic acid, plasma     Status: Abnormal   Collection Time: 02/26/15  5:03 AM  Result Value Ref Range   Lactic Acid, Venous 2.2 (HH) 0.5 - 2.0 mmol/L    Comment: CRITICAL RESULT CALLED TO, READ BACK BY AND VERIFIED WITH: HOGAN,M/5W $RemoveBeforeDE'[id number]$  ON 02/26/15 BY KARCZEWSKI,S.   Hemoglobin A1c     Status: None   Collection Time: 02/26/15  5:03 AM  Result Value Ref Range   Hgb A1c MFr Bld 5.3 4.8 - 5.6 %    Comment: (NOTE)         Pre-diabetes: 5.7 - 6.4         Diabetes: >6.4         Glycemic control for adults with diabetes: <7.0    Mean Plasma Glucose 105 mg/dL    Comment: (NOTE) Performed At: [facility] [place], [place] [id number] Lindon Romp MD DU:[id number]   Glucose, capillary     Status: Abnormal   Collection Time: 02/26/15  6:41 AM  Result Value Ref Range   Glucose-Capillary 185 (H) 65 - 99 mg/dL  Glucose, capillary     Status: Abnormal   Collection Time: 02/26/15  8:43 AM  Result Value Ref Range   Glucose-Capillary 162 (H) 65 - 99 mg/dL  Glucose, capillary     Status: Abnormal   Collection Time: 02/26/15 12:26 PM  Result Value Ref Range   Glucose-Capillary 299 (H) 65 - 99 mg/dL  Glucose, capillary     Status: Abnormal   Collection Time: 02/26/15  4:36 PM  Result Value Ref Range   Glucose-Capillary 249 (H) 65 - 99 mg/dL  Vancomycin, trough     Status: None   Collection Time: 02/26/15  5:40 PM  Result Value Ref Range   Vancomycin Tr 11 10.0 - 20.0 ug/mL  Glucose, capillary     Status: Abnormal   Collection Time: 02/26/15  9:46 PM  Result Value Ref Range    Glucose-Capillary 220 (H) 65 - 99 mg/dL  CBC     Status: Abnormal   Collection Time: 02/27/15  8:30 AM  Result Value Ref Range   WBC 13.4 (H) 4.0 - 10.5 K/uL   RBC 4.07 3.87 - 5.11 MIL/uL   Hemoglobin 12.8 12.0 - 15.0 g/dL   HCT 38.0 36.0 - 46.0 %   MCV 93.4 78.0 - 100.0 fL  MCH 31.4 26.0 - 34.0 pg   MCHC 33.7 30.0 - 36.0 g/dL   RDW 12.7 11.5 - 15.5 %   Platelets 297 150 - 400 K/uL  Lactic acid, plasma     Status: Abnormal   Collection Time: 02/27/15  8:30 AM  Result Value Ref Range   Lactic Acid, Venous 2.3 (HH) 0.5 - 2.0 mmol/L    Comment: CRITICAL RESULT CALLED TO, READ BACK BY AND VERIFIED WITH: P.ARMSTRONG AT 0904 ON 02/27/15 BY S.VANHOORNE   Culture, blood (routine x 2)     Status: None   Collection Time: 02/27/15  8:35 AM  Result Value Ref Range   Specimen Description RIGHT ANTECUBITAL    Special Requests BOTTLES DRAWN AEROBIC AND ANAEROBIC 10CC    Culture      NO GROWTH 5 DAYS Performed at [facility]    Report Status 03/04/2015 FINAL   Culture, blood (routine x 2)     Status: None   Collection Time: 02/27/15  8:41 AM  Result Value Ref Range   Specimen Description BLOOD RIGHT HAND    Special Requests BOTTLES DRAWN AEROBIC AND ANAEROBIC 10CC    Culture      NO GROWTH 5 DAYS Performed at [facility]    Report Status 03/04/2015 FINAL   Glucose, capillary     Status: Abnormal   Collection Time: 02/27/15  9:33 AM  Result Value Ref Range   Glucose-Capillary 129 (H) 65 - 99 mg/dL  Glucose, capillary     Status: Abnormal   Collection Time: 02/27/15  3:14 PM  Result Value Ref Range   Glucose-Capillary 118 (H) 65 - 99 mg/dL  Glucose, capillary     Status: Abnormal   Collection Time: 02/27/15  6:12 PM  Result Value Ref Range   Glucose-Capillary 162 (H) 65 - 99 mg/dL  Lactic acid, plasma     Status: None   Collection Time: 02/28/15  6:12 AM  Result Value Ref Range   Lactic Acid, Venous 1.4 0.5 - 2.0 mmol/L  Glucose, capillary     Status: Abnormal     Collection Time: 02/28/15  8:52 AM  Result Value Ref Range   Glucose-Capillary 120 (H) 65 - 99 mg/dL  Glucose, capillary     Status: Abnormal   Collection Time: 02/28/15  1:20 PM  Result Value Ref Range   Glucose-Capillary 144 (H) 65 - 99 mg/dL  TSH     Status: None   Collection Time: 04/14/15 11:49 AM  Result Value Ref Range   TSH 1.80 0.35 - 4.50 uIU/mL    Assessment/Plan: Chronic knee pain Weight a contributing factor. Giving symptoms, potential partial meniscal tear. Knee brace discussed. Wear daily. Supportive measures and pain management reviewed. Will obtain x-ray today. Ortho or Sports Med pending results.  Morbid obesity Patient is actually not eating enough to promote good metabolism. Discussed appropriate portion size and frequency of meals to promote weight loss. Will check thyroid level today. Exercise regimen discussed.  Plantar fasciitis Rx medrol pack as patient allergic to NSAIDs. Supportive foot wear, compression and cold can exercises reviewed. Follow-up if symptoms are not markedly improving.

## 2015-04-18 DIAGNOSIS — M722 Plantar fascial fibromatosis: Secondary | ICD-10-CM | POA: Insufficient documentation

## 2015-04-18 DIAGNOSIS — M25569 Pain in unspecified knee: Secondary | ICD-10-CM

## 2015-04-18 DIAGNOSIS — G8929 Other chronic pain: Secondary | ICD-10-CM | POA: Insufficient documentation

## 2015-04-18 NOTE — Assessment & Plan Note (Signed)
Weight a contributing factor. Giving symptoms, potential partial meniscal tear. Knee brace discussed. Wear daily. Supportive measures and pain management reviewed. Will obtain x-ray today. Ortho or Sports Med pending results.

## 2015-04-18 NOTE — Assessment & Plan Note (Signed)
Rx medrol pack as patient allergic to NSAIDs. Supportive foot wear, compression and cold can exercises reviewed. Follow-up if symptoms are not markedly improving.

## 2015-04-18 NOTE — Assessment & Plan Note (Signed)
Patient is actually not eating enough to promote good metabolism. Discussed appropriate portion size and frequency of meals to promote weight loss. Will check thyroid level today. Exercise regimen discussed.

## 2015-04-19 ENCOUNTER — Telehealth: Payer: Self-pay | Admitting: *Deleted

## 2015-04-19 DIAGNOSIS — S838X1D Sprain of other specified parts of right knee, subsequent encounter: Secondary | ICD-10-CM

## 2015-04-19 NOTE — Telephone Encounter (Signed)
Called and spoke with the pt and informed her of recent x-ray results and note.  Pt verbalized understanding.  Pt stated that she would like to get an MRI.  Pt would stated that she can go on Thurs or Friday.//AB/CMA

## 2015-04-19 NOTE — Telephone Encounter (Signed)
MRI order placed. Patient will be contacted to schedule. 

## 2015-04-19 NOTE — Telephone Encounter (Signed)
-----   Message from Waldon MerlWilliam C Martin, PA-C sent at 04/14/2015  8:40 AM EST ----- X-ray unremarkable overall. Do think there may be a meniscal source of her symptoms. We have two options -- can get an MRI and set up with Orthopedics, or we can refer to Sports medicine for a gait assessment and further examination. I am fine with either as there is no history of injury.

## 2015-04-24 ENCOUNTER — Ambulatory Visit (HOSPITAL_BASED_OUTPATIENT_CLINIC_OR_DEPARTMENT_OTHER)
Admission: RE | Admit: 2015-04-24 | Discharge: 2015-04-24 | Disposition: A | Discharge status: Home / Self Care | Payer: 59 | Source: Ambulatory Visit | Attending: Physician Assistant | Admitting: Physician Assistant

## 2015-04-24 DIAGNOSIS — S838X1D Sprain of other specified parts of right knee, subsequent encounter: Secondary | ICD-10-CM

## 2015-04-24 DIAGNOSIS — R609 Edema, unspecified: Secondary | ICD-10-CM | POA: Insufficient documentation

## 2015-04-24 DIAGNOSIS — M25461 Effusion, right knee: Secondary | ICD-10-CM | POA: Insufficient documentation

## 2015-04-24 DIAGNOSIS — M25561 Pain in right knee: Secondary | ICD-10-CM | POA: Insufficient documentation

## 2015-04-30 ENCOUNTER — Telehealth: Payer: Self-pay | Admitting: *Deleted

## 2015-04-30 DIAGNOSIS — M2341 Loose body in knee, right knee: Secondary | ICD-10-CM

## 2015-04-30 NOTE — Telephone Encounter (Signed)
Called and spoke with the pt and informed her of recent MRI results and note.  Pt verbalized understanding and agreed to the referral to the Orthopedics for management.//AB/CMA

## 2015-04-30 NOTE — Telephone Encounter (Signed)
-----   Message from Waldon MerlWilliam C Martin, PA-C sent at 04/26/2015 12:05 PM EST ----- MRi is not picking up a tear but notes thinning of cartilage in the knee joints with swelling in the joint. I need to get her in with Orthopedics for management. Let me know if she is willing and I will place referral.

## 2015-04-30 NOTE — Telephone Encounter (Signed)
Referral placed.

## 2015-05-19 ENCOUNTER — Encounter (HOSPITAL_COMMUNITY): Payer: Self-pay | Admitting: Psychiatry

## 2015-05-19 ENCOUNTER — Ambulatory Visit (INDEPENDENT_AMBULATORY_CARE_PROVIDER_SITE_OTHER): Payer: 59 | Admitting: Psychiatry

## 2015-05-19 VITALS — BP 126/84 | HR 99 | Ht 64.0 in | Wt 262.2 lb

## 2015-05-19 DIAGNOSIS — F3131 Bipolar disorder, current episode depressed, mild: Secondary | ICD-10-CM | POA: Diagnosis not present

## 2015-05-19 DIAGNOSIS — F411 Generalized anxiety disorder: Secondary | ICD-10-CM | POA: Diagnosis not present

## 2015-05-19 MED ORDER — TRIFLUOPERAZINE HCL 2 MG PO TABS: 2.0000 mg | ORAL_TABLET | Freq: Two times a day (BID) | ORAL | Status: DC

## 2015-05-19 MED ORDER — ZOLPIDEM TARTRATE 10 MG PO TABS: 10.0000 mg | ORAL_TABLET | Freq: Every day | ORAL | Status: DC

## 2015-05-19 MED ORDER — CLONAZEPAM 1 MG PO TABS: 1.0000 mg | ORAL_TABLET | Freq: Three times a day (TID) | ORAL | Status: DC

## 2015-05-19 MED ORDER — QUETIAPINE FUMARATE 200 MG PO TABS: 200.0000 mg | ORAL_TABLET | Freq: Every day | ORAL | Status: DC

## 2015-05-19 MED ORDER — OXCARBAZEPINE 300 MG PO TABS: 300.0000 mg | ORAL_TABLET | Freq: Three times a day (TID) | ORAL | Status: DC

## 2015-05-19 MED ORDER — FLUOXETINE HCL 20 MG PO TABS: 60.0000 mg | ORAL_TABLET | Freq: Every day | ORAL | Status: DC

## 2015-05-19 MED FILL — FLUoxetine HCL 20 MG TABS: 20 | 30 days supply | Qty: 90 | Fill #0

## 2015-05-19 MED FILL — QUETIAPINE FUMARATE 200 MG: 200 | 30 days supply | Qty: 30 | Fill #0

## 2015-05-19 MED FILL — OXcarbazepine 300 MG TABS: 300 | 30 days supply | Qty: 90 | Fill #0

## 2015-05-19 MED FILL — TRIFLUOPERAZINE 2 MG TABLET: 2 | 30 days supply | Qty: 60 | Fill #0

## 2015-05-19 NOTE — Progress Notes (Signed)
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 physician for continuation of Cardura as it is a antihypertensive medication and will not prescribe for anxiety.  Patient acknowledged and accepted.  Discuss in detail safety plan that anytime having active suicidal thoughts or homicidal thoughts and she need to call 911 or go to local emergency room.  Discussed benzodiazepine dependence, tolerance and withdrawal.  See her again in 3 weeks.  ARFEEN,SYED T., MD 05/19/2015

## 2015-05-20 MED FILL — clonazePAM 1 MG TABS: 1 | 30 days supply | Qty: 90 | Fill #0

## 2015-05-31 MED FILL — valACYclovir HCL 1 GM TABS: 1 | 30 days supply | Qty: 30 | Fill #2

## 2015-05-31 MED FILL — ZOLPIDEM TARTRATE 10 MG TAB: 10 | 30 days supply | Qty: 30 | Fill #0

## 2015-06-04 ENCOUNTER — Telehealth: Payer: Self-pay | Admitting: *Deleted

## 2015-06-04 NOTE — Telephone Encounter (Signed)
Dr. Lolly Mustache with behavioral health prescribes her medications. This will have to come from him.

## 2015-06-04 NOTE — Telephone Encounter (Signed)
Pt left msg on triage stating she is leaving going out of town been trying to get refill on her med Amitriptyline. Requesting med to be refill today. Wrong office pt see Selena Batten @ [facility] forwarding msg to [place]...Raechel Chute

## 2015-06-04 NOTE — Telephone Encounter (Signed)
Called pt no answer LMOM with cody response...Raechel Chute

## 2015-06-07 ENCOUNTER — Other Ambulatory Visit (HOSPITAL_COMMUNITY): Payer: Self-pay | Admitting: Psychiatry

## 2015-06-07 ENCOUNTER — Telehealth (HOSPITAL_COMMUNITY): Payer: Self-pay

## 2015-06-07 MED ORDER — AMITRIPTYLINE HCL 25 MG PO TABS: 25.0000 mg | ORAL_TABLET | Freq: Three times a day (TID) | ORAL | Status: DC

## 2015-06-07 MED FILL — AMITRIPTYLINE HCL 25 MG TAB: 25 | 30 days supply | Qty: 90 | Fill #0

## 2015-06-07 NOTE — Telephone Encounter (Signed)
Patient is calling for a refill on her amitriptyline  TID. She would like this sent to the [facility]. She has a f/u appointment on the 26th, but is currently out of the medication. Is it okay to refil? Please advise, thank you.

## 2015-06-10 ENCOUNTER — Ambulatory Visit (INDEPENDENT_AMBULATORY_CARE_PROVIDER_SITE_OTHER): Payer: 59 | Admitting: Psychiatry

## 2015-06-10 ENCOUNTER — Encounter (HOSPITAL_COMMUNITY): Payer: Self-pay | Admitting: Psychiatry

## 2015-06-10 VITALS — BP 116/78 | HR 79 | Ht 64.0 in | Wt 257.0 lb

## 2015-06-10 DIAGNOSIS — F3131 Bipolar disorder, current episode depressed, mild: Secondary | ICD-10-CM

## 2015-06-10 DIAGNOSIS — F411 Generalized anxiety disorder: Secondary | ICD-10-CM | POA: Diagnosis not present

## 2015-06-10 MED ORDER — AMITRIPTYLINE HCL 25 MG PO TABS: 25.0000 mg | ORAL_TABLET | Freq: Three times a day (TID) | ORAL | Status: DC

## 2015-06-10 MED ORDER — ARIPIPRAZOLE 5 MG PO TABS: 5.0000 mg | ORAL_TABLET | Freq: Every day | ORAL | Status: DC

## 2015-06-10 MED ORDER — OXCARBAZEPINE 300 MG PO TABS: 300.0000 mg | ORAL_TABLET | Freq: Three times a day (TID) | ORAL | Status: DC

## 2015-06-10 MED ORDER — TRIFLUOPERAZINE HCL 2 MG PO TABS: 2.0000 mg | ORAL_TABLET | Freq: Every day | ORAL | Status: DC

## 2015-06-10 MED ORDER — ZOLPIDEM TARTRATE 10 MG PO TABS: 10.0000 mg | ORAL_TABLET | Freq: Every day | ORAL | Status: DC

## 2015-06-10 MED ORDER — FLUOXETINE HCL 20 MG PO TABS: 60.0000 mg | ORAL_TABLET | Freq: Every day | ORAL | Status: DC

## 2015-06-10 MED ORDER — CLONAZEPAM 1 MG PO TABS: 1.0000 mg | ORAL_TABLET | Freq: Three times a day (TID) | ORAL | Status: DC

## 2015-06-10 MED FILL — ARIPiprazole 5 MG TABS: 5 | 30 days supply | Qty: 30 | Fill #0

## 2015-06-10 NOTE — Telephone Encounter (Signed)
This was refilled on 1/23
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
## 2015-06-21 MED FILL — TRIFLUOPERAZINE 2 MG TABLET: 2 | 30 days supply | Qty: 30 | Fill #0

## 2015-06-21 MED FILL — OXcarbazepine 300 MG TABS: 300 | 30 days supply | Qty: 90 | Fill #0

## 2015-06-21 MED FILL — FLUoxetine HCL 20 MG TABS: 20 | 30 days supply | Qty: 90 | Fill #0

## 2015-06-21 MED FILL — clonazePAM 1 MG TABS: 1 | 30 days supply | Qty: 90 | Fill #0

## 2015-06-25 ENCOUNTER — Telehealth: Payer: Self-pay | Admitting: Physician Assistant

## 2015-06-25 ENCOUNTER — Ambulatory Visit (INDEPENDENT_AMBULATORY_CARE_PROVIDER_SITE_OTHER): Payer: 59 | Admitting: Physician Assistant

## 2015-06-25 ENCOUNTER — Encounter: Payer: Self-pay | Admitting: Physician Assistant

## 2015-06-25 VITALS — BP 114/78 | HR 84 | Temp 98.1°F | Ht 64.0 in | Wt 253.6 lb

## 2015-06-25 DIAGNOSIS — R0681 Apnea, not elsewhere classified: Secondary | ICD-10-CM

## 2015-06-25 DIAGNOSIS — R400 Somnolence: Secondary | ICD-10-CM

## 2015-06-25 DIAGNOSIS — G4710 Hypersomnia, unspecified: Secondary | ICD-10-CM

## 2015-06-25 LAB — COMPREHENSIVE METABOLIC PANEL
ALK PHOS: 67 U/L (ref 39–117)
ALT: 12 U/L (ref 0–35)
AST: 16 U/L (ref 0–37)
Albumin: 3.9 g/dL (ref 3.5–5.2)
BILIRUBIN TOTAL: 0.3 mg/dL (ref 0.2–1.2)
BUN: 8 mg/dL (ref 6–23)
CO2: 28 mEq/L (ref 19–32)
Calcium: 9 mg/dL (ref 8.4–10.5)
Chloride: 106 mEq/L (ref 96–112)
Creatinine, Ser: 0.65 mg/dL (ref 0.40–1.20)
GFR: 105.91 mL/min (ref >60.00)
GLUCOSE: 83 mg/dL (ref 70–99)
POTASSIUM: 4.3 meq/L (ref 3.5–5.1)
Sodium: 138 mEq/L (ref 135–145)
TOTAL PROTEIN: 7.1 g/dL (ref 6.0–8.3)

## 2015-06-25 LAB — FOLLICLE STIMULATING HORMONE: FSH: 34 m[IU]/mL

## 2015-06-25 LAB — TSH: TSH: 0.74 u[IU]/mL (ref 0.35–4.50)

## 2015-06-25 NOTE — Telephone Encounter (Signed)
FSH added.

## 2015-06-25 NOTE — Progress Notes (Signed)
Patient presents to clinic today c/o difficulty with daytime somnolence despite adequate rest at night. Her fiance has noted increased snoring and apneic episodes. Body mass index is 43.51 kg/(m^2). Patient endorses falling asleep at work twice. Denies falling asleep at the wheel.  Past Medical History  Diagnosis Date  . Kidney stone   . Anxiety   . Bipolar affective disorder ([facility])   . SBO (small bowel obstruction) ([facility]) 01/2014  . Anemia   . Blood transfusion without reported diagnosis   . Depression     Current Outpatient Prescriptions on File Prior to Visit  Medication Sig Dispense Refill  . amitriptyline (ELAVIL) 25 MG tablet Take 1 tablet (25 mg total) by mouth 3 (three) times daily. 90 tablet 1  . ARIPiprazole (ABILIFY) 5 MG tablet Take 1 tablet (5 mg total) by mouth daily. 30 tablet 1  . clonazePAM (KLONOPIN) 1 MG tablet Take 1 tablet (1 mg total) by mouth 3 (three) times daily. 90 tablet 1  . FLUoxetine (PROZAC) 20 MG tablet Take 3 tablets (60 mg total) by mouth daily. 90 tablet 1  . Oxcarbazepine (TRILEPTAL) 300 MG tablet Take 1 tablet (300 mg total) by mouth 3 (three) times daily. 90 tablet 1  . trifluoperazine (STELAZINE) 2 MG tablet Take 1 tablet (2 mg total) by mouth at bedtime. 30 tablet 1  . valACYclovir (VALTREX) 1000 MG tablet Take 1,000 mg by mouth daily.    Marland Kitchen zolpidem (AMBIEN) 10 MG tablet Take 1 tablet (10 mg total) by mouth at bedtime. 30 tablet 1   No current facility-administered medications on file prior to visit.    Allergies  Allergen Reactions  . Nsaids Other (See Comments)    Contraindicated with gastric bypass    Family History  Problem Relation Age of Onset  . Diabetes Mother   . Cancer Mother     Social History   Social History  . Marital Status: Single    Spouse Name: N/A  . Number of Children: N/A  . Years of Education: N/A   Social History Main Topics  . Smoking status: Current Every Day Smoker -- 0.25 packs/day    Last Attempt to  Quit: 05/11/2014  . Smokeless tobacco: Never Used  . Alcohol Use: No  . Drug Use: No  . Sexual Activity: Yes    Birth Control/ Protection: Surgical   Other Topics Concern  . None   Social History Narrative   Review of Systems - See HPI.  All other ROS are negative.  BP 114/78 mmHg  Pulse 84  Temp(Src) 98.1 F (36.7 C) (Oral)  Ht  (1.626 m)  Wt 253 lb 9.6 oz (115.032 kg)  BMI 43.51 kg/m2  SpO2 98%  Physical Exam  Constitutional: She is oriented to person, place, and time and well-developed, well-nourished, and in no distress.  HENT:  Head: Normocephalic and atraumatic.  Right Ear: External ear normal.  Left Ear: External ear normal.  Mouth/Throat: Oropharynx is clear and moist. No oropharyngeal exudate.  Eyes: Conjunctivae are normal.  Neck: Neck supple.  Cardiovascular: Normal rate, regular rhythm, normal heart sounds and intact distal pulses.   Pulmonary/Chest: Effort normal and breath sounds normal. No respiratory distress. She has no wheezes. She has no rales. She exhibits no tenderness.  Neurological: She is alert and oriented to person, place, and time.  Skin: Skin is warm and dry.  Vitals reviewed.   Recent Results (from the past 2160 hour(s))  TSH     Status: None  Collection Time: 04/14/15 11:49 AM  Result Value Ref Range   TSH 1.80 0.35 - 4.50 uIU/mL    Assessment/Plan: Apneic episode With snoring and daytime somnolence. Body mass index is 43.51 kg/(m^2). Concern for OSA. Referral to Pulmonology placed. Due to somnolence will also check lab panel today.

## 2015-06-25 NOTE — Patient Instructions (Signed)
Please go to the lab for blood work. I will call with your results. You will be contacted by Sleep Medicine/Pulmonology for assessment and to set up a sleep study.  Please continue medications as directed.

## 2015-06-25 NOTE — Telephone Encounter (Signed)
Caller name: Self   Can be reached: [phone] Pharmacy:  Reason for call: Request [facility] test be added to her labs drawn this morning.

## 2015-06-25 NOTE — Telephone Encounter (Signed)
See if it can be added to tubes drawn.

## 2015-06-25 NOTE — Assessment & Plan Note (Addendum)
With snoring and daytime somnolence. Body mass index is 43.51 kg/(m^2). Concern for OSA. Referral to Pulmonology placed. Due to somnolence will also check lab panel today.

## 2015-06-25 NOTE — Progress Notes (Signed)
Pre visit review using our clinic review tool, if applicable. No additional management support is needed unless otherwise documented below in the visit note. 

## 2015-06-26 LAB — CBC
HEMATOCRIT: 43.9 % (ref 36.0–46.0)
HEMOGLOBIN: 14.3 g/dL (ref 12.0–15.0)
MCHC: 32.6 g/dL (ref 30.0–36.0)
MCV: 94.2 fl (ref 78.0–100.0)
PLATELETS: 310 10*3/uL (ref 150.0–400.0)
RBC: 4.66 Mil/uL (ref 3.87–5.11)
RDW: 13.5 % (ref 11.5–15.5)
WBC: 5 10*3/uL (ref 4.0–10.5)

## 2015-06-28 ENCOUNTER — Ambulatory Visit (HOSPITAL_COMMUNITY): Payer: Self-pay | Admitting: Clinical

## 2015-06-28 ENCOUNTER — Telehealth (HOSPITAL_COMMUNITY): Payer: Self-pay

## 2015-06-28 ENCOUNTER — Other Ambulatory Visit (HOSPITAL_COMMUNITY): Payer: Self-pay | Admitting: Psychiatry

## 2015-06-28 MED FILL — ZOLPIDEM TARTRATE 10 MG TAB: 10 | 30 days supply | Qty: 30 | Fill #0

## 2015-06-28 NOTE — Telephone Encounter (Signed)
Patient calling, she states that being on the 5 mg of Abilify is bringing back her old nervous tics. She is starting to scratch/ itch and shaking her leg and/or arm. She would like to increase dose to 10 mg if possible. Please review and advise, thank you

## 2015-07-01 NOTE — Telephone Encounter (Signed)
She should continue current dose until seen

## 2015-07-02 NOTE — Telephone Encounter (Signed)
I called the patient and left a voicemail letting her know that a medication increase or change would have to be discussed at her appointment

## 2015-07-09 MED FILL — AMITRIPTYLINE HCL 25 MG TAB: 25 | 30 days supply | Qty: 90 | Fill #0

## 2015-07-09 MED FILL — valACYclovir HCL 1 GM TABS: 1 | 30 days supply | Qty: 30 | Fill #3

## 2015-07-09 MED FILL — ARIPiprazole 5 MG TABS: 5 | 30 days supply | Qty: 30 | Fill #1

## 2015-07-22 ENCOUNTER — Ambulatory Visit (HOSPITAL_COMMUNITY): Payer: Self-pay | Admitting: Psychiatry

## 2015-07-22 MED FILL — clonazePAM 1 MG TABS: 1 | 30 days supply | Qty: 90 | Fill #1

## 2015-07-27 ENCOUNTER — Telehealth: Payer: Self-pay | Admitting: Physician Assistant

## 2015-07-27 MED ORDER — NICOTINE 14 MG/24HR TD PT24: 14.0000 mg | MEDICATED_PATCH | Freq: Every day | TRANSDERMAL | Status: DC

## 2015-07-27 NOTE — Telephone Encounter (Signed)
Rx sent 

## 2015-07-27 NOTE — Telephone Encounter (Signed)
Pharmacy: [facility] [place], [place] [address]  Reason for call: pt would like RX sent in for Nicoderm patches.

## 2015-07-28 ENCOUNTER — Encounter (HOSPITAL_COMMUNITY): Payer: Self-pay | Admitting: Psychiatry

## 2015-07-28 ENCOUNTER — Telehealth (HOSPITAL_COMMUNITY): Payer: Self-pay

## 2015-07-28 ENCOUNTER — Ambulatory Visit (INDEPENDENT_AMBULATORY_CARE_PROVIDER_SITE_OTHER): Payer: 59 | Admitting: Psychiatry

## 2015-07-28 VITALS — BP 104/68 | HR 83 | Ht 64.0 in | Wt 251.6 lb

## 2015-07-28 DIAGNOSIS — F3131 Bipolar disorder, current episode depressed, mild: Secondary | ICD-10-CM | POA: Diagnosis not present

## 2015-07-28 DIAGNOSIS — F411 Generalized anxiety disorder: Secondary | ICD-10-CM

## 2015-07-28 MED ORDER — AMITRIPTYLINE HCL 25 MG PO TABS: 25.0000 mg | ORAL_TABLET | Freq: Three times a day (TID) | ORAL | Status: DC

## 2015-07-28 MED ORDER — OXCARBAZEPINE 300 MG PO TABS: 300.0000 mg | ORAL_TABLET | Freq: Three times a day (TID) | ORAL | Status: DC

## 2015-07-28 MED ORDER — HYDROXYZINE PAMOATE 100 MG PO CAPS: 100.0000 mg | ORAL_CAPSULE | Freq: Two times a day (BID) | ORAL | Status: DC | PRN

## 2015-07-28 MED ORDER — CLONAZEPAM 1 MG PO TABS: 1.0000 mg | ORAL_TABLET | Freq: Three times a day (TID) | ORAL | Status: DC

## 2015-07-28 MED ORDER — ARIPIPRAZOLE 5 MG PO TABS: 5.0000 mg | ORAL_TABLET | Freq: Every day | ORAL | Status: DC

## 2015-07-28 MED ORDER — ZOLPIDEM TARTRATE 10 MG PO TABS: 10.0000 mg | ORAL_TABLET | Freq: Every day | ORAL | Status: DC

## 2015-07-28 MED ORDER — TRIFLUOPERAZINE HCL 2 MG PO TABS: 2.0000 mg | ORAL_TABLET | Freq: Every day | ORAL | Status: DC

## 2015-07-28 MED ORDER — FLUOXETINE HCL 20 MG PO TABS: 60.0000 mg | ORAL_TABLET | Freq: Every day | ORAL | Status: DC

## 2015-07-28 MED FILL — OXcarbazepine 300 MG TABS: 300 | 30 days supply | Qty: 90 | Fill #0

## 2015-07-28 MED FILL — TRIFLUOPERAZINE 2 MG TABLET: 2 | 30 days supply | Qty: 30 | Fill #0

## 2015-07-28 MED FILL — HYDROXYZINE PAM 50 MG CAP: 50 | 30 days supply | Qty: 120 | Fill #0

## 2015-07-28 MED FILL — FLUoxetine HCL 20 MG TABS: 20 | 30 days supply | Qty: 90 | Fill #0
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
## 2015-07-28 NOTE — Telephone Encounter (Signed)
Patient calling, she states that you were supposed to send in visteril after her appointment today, the pharmacy does not have it yet. Please review and advise, thank you

## 2015-07-29 ENCOUNTER — Other Ambulatory Visit (HOSPITAL_COMMUNITY): Payer: Self-pay | Admitting: Psychiatry

## 2015-07-29 MED FILL — AMITRIPTYLINE HCL 25 MG TAB: 25 | 30 days supply | Qty: 90 | Fill #0

## 2015-07-29 MED FILL — ARIPiprazole 5 MG TABS: 5 | 30 days supply | Qty: 30 | Fill #0

## 2015-07-29 MED FILL — ZOLPIDEM TARTRATE 10 MG TAB: 10 | 30 days supply | Qty: 30 | Fill #0

## 2015-07-29 MED FILL — NICOTINE 14 MG/24HR PATCH: 14 | 28 days supply | Qty: 28 | Fill #0

## 2015-07-29 NOTE — Telephone Encounter (Signed)
Dr. Lolly MustacheArfeen sent this in to the pharmacy.

## 2015-08-04 ENCOUNTER — Ambulatory Visit (INDEPENDENT_AMBULATORY_CARE_PROVIDER_SITE_OTHER): Payer: 59 | Admitting: Clinical

## 2015-08-04 ENCOUNTER — Encounter (HOSPITAL_COMMUNITY): Payer: Self-pay | Admitting: Clinical

## 2015-08-04 DIAGNOSIS — F3162 Bipolar disorder, current episode mixed, moderate: Secondary | ICD-10-CM | POA: Diagnosis not present

## 2015-08-04 DIAGNOSIS — F603 Borderline personality disorder: Secondary | ICD-10-CM | POA: Diagnosis not present
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
## 2015-08-11 ENCOUNTER — Ambulatory Visit (HOSPITAL_COMMUNITY): Payer: Self-pay | Admitting: Clinical

## 2015-08-13 MED FILL — valACYclovir HCL 1 GM TABS: 1 | 30 days supply | Qty: 30 | Fill #4

## 2015-08-23 MED FILL — clonazePAM 1 MG TABS: 1 | 30 days supply | Qty: 90 | Fill #0

## 2015-08-23 MED FILL — ZOLPIDEM TARTRATE 10 MG TAB: 10 | 30 days supply | Qty: 30 | Fill #1

## 2015-08-26 ENCOUNTER — Emergency Department (HOSPITAL_COMMUNITY): Payer: 59

## 2015-08-26 ENCOUNTER — Emergency Department (HOSPITAL_COMMUNITY)
Admission: EM | Admit: 2015-08-26 | Discharge: 2015-08-26 | Disposition: A | Discharge status: Home / Self Care | Payer: 59 | Attending: Emergency Medicine | Admitting: Emergency Medicine

## 2015-08-26 ENCOUNTER — Encounter (HOSPITAL_COMMUNITY): Payer: Self-pay

## 2015-08-26 DIAGNOSIS — F419 Anxiety disorder, unspecified: Secondary | ICD-10-CM | POA: Insufficient documentation

## 2015-08-26 DIAGNOSIS — F1310 Sedative, hypnotic or anxiolytic abuse, uncomplicated: Secondary | ICD-10-CM | POA: Insufficient documentation

## 2015-08-26 DIAGNOSIS — Z862 Personal history of diseases of the blood and blood-forming organs and certain disorders involving the immune mechanism: Secondary | ICD-10-CM | POA: Insufficient documentation

## 2015-08-26 DIAGNOSIS — F319 Bipolar disorder, unspecified: Secondary | ICD-10-CM | POA: Insufficient documentation

## 2015-08-26 DIAGNOSIS — R4781 Slurred speech: Secondary | ICD-10-CM | POA: Diagnosis not present

## 2015-08-26 DIAGNOSIS — Z79899 Other long term (current) drug therapy: Secondary | ICD-10-CM | POA: Diagnosis not present

## 2015-08-26 DIAGNOSIS — F17200 Nicotine dependence, unspecified, uncomplicated: Secondary | ICD-10-CM | POA: Diagnosis not present

## 2015-08-26 DIAGNOSIS — Z87442 Personal history of urinary calculi: Secondary | ICD-10-CM | POA: Insufficient documentation

## 2015-08-26 DIAGNOSIS — R410 Disorientation, unspecified: Secondary | ICD-10-CM | POA: Diagnosis not present

## 2015-08-26 DIAGNOSIS — R2681 Unsteadiness on feet: Secondary | ICD-10-CM | POA: Diagnosis present

## 2015-08-26 DIAGNOSIS — R42 Dizziness and giddiness: Secondary | ICD-10-CM | POA: Diagnosis not present

## 2015-08-26 LAB — COMPREHENSIVE METABOLIC PANEL
ALBUMIN: 3.2 g/dL — AB (ref 3.5–5.0)
ALK PHOS: 56 U/L (ref 38–126)
ALT: 16 U/L (ref 14–54)
AST: 20 U/L (ref 15–41)
Anion gap: 9 (ref 5–15)
BILIRUBIN TOTAL: 0.5 mg/dL (ref 0.3–1.2)
BUN: 14 mg/dL (ref 6–20)
CALCIUM: 8.7 mg/dL — AB (ref 8.9–10.3)
CO2: 24 mmol/L (ref 22–32)
CREATININE: 0.8 mg/dL (ref 0.44–1.00)
Chloride: 109 mmol/L (ref 101–111)
GFR calc Af Amer: >60 mL/min (ref >60)
GFR calc non Af Amer: >60 mL/min (ref >60)
Glucose, Bld: 83 mg/dL (ref 65–99)
Potassium: 5 mmol/L (ref 3.5–5.1)
SODIUM: 142 mmol/L (ref 135–145)
TOTAL PROTEIN: 5.7 g/dL — AB (ref 6.5–8.1)

## 2015-08-26 LAB — DIFFERENTIAL
BASOS ABS: 0 10*3/uL (ref 0.0–0.1)
Basophils Relative: 0 %
EOS PCT: 4 %
Eosinophils Absolute: 0.3 10*3/uL (ref 0.0–0.7)
LYMPHS ABS: 2 10*3/uL (ref 0.7–4.0)
LYMPHS PCT: 30 %
Monocytes Absolute: 0.5 10*3/uL (ref 0.1–1.0)
Monocytes Relative: 7 %
NEUTROS ABS: 3.9 10*3/uL (ref 1.7–7.7)
NEUTROS PCT: 59 %

## 2015-08-26 LAB — RAPID URINE DRUG SCREEN, HOSP PERFORMED
AMPHETAMINES: NOT DETECTED
BARBITURATES: NOT DETECTED
Benzodiazepines: POSITIVE — AB
Cocaine: NOT DETECTED
Opiates: NOT DETECTED
TETRAHYDROCANNABINOL: NOT DETECTED

## 2015-08-26 LAB — URINALYSIS, ROUTINE W REFLEX MICROSCOPIC
Glucose, UA: NEGATIVE mg/dL
HGB URINE DIPSTICK: NEGATIVE
Ketones, ur: 15 mg/dL — AB
Leukocytes, UA: NEGATIVE
Nitrite: NEGATIVE
PH: 5.5 (ref 5.0–8.0)
Protein, ur: NEGATIVE mg/dL
SPECIFIC GRAVITY, URINE: 1.029 (ref 1.005–1.030)

## 2015-08-26 LAB — CBC
HCT: 39.2 % (ref 36.0–46.0)
HEMOGLOBIN: 13.2 g/dL (ref 12.0–15.0)
MCH: 29.7 pg (ref 26.0–34.0)
MCHC: 33.7 g/dL (ref 30.0–36.0)
MCV: 88.3 fL (ref 78.0–100.0)
Platelets: 312 10*3/uL (ref 150–400)
RBC: 4.44 MIL/uL (ref 3.87–5.11)
RDW: 13.1 % (ref 11.5–15.5)
WBC: 6.8 10*3/uL (ref 4.0–10.5)

## 2015-08-26 LAB — APTT: aPTT: 32 seconds (ref 24–37)

## 2015-08-26 LAB — CBG MONITORING, ED: Glucose-Capillary: 82 mg/dL (ref 65–99)

## 2015-08-26 LAB — I-STAT TROPONIN, ED: Troponin i, poc: 0 ng/mL (ref 0.00–0.08)

## 2015-08-26 LAB — PROTIME-INR
INR: 0.98 (ref 0.00–1.49)
Prothrombin Time: 13.2 seconds (ref 11.6–15.2)

## 2015-08-26 MED FILL — AMITRIPTYLINE HCL 25 MG TAB: 25 | 30 days supply | Qty: 90 | Fill #1

## 2015-08-26 MED FILL — HYDROXYZINE PAM 50 MG CAP: 50 | 30 days supply | Qty: 120 | Fill #1

## 2015-08-26 MED FILL — TRIFLUOPERAZINE 2 MG TABLET: 2 | 30 days supply | Qty: 30 | Fill #1

## 2015-08-26 MED FILL — ARIPiprazole 5 MG TABS: 5 | 30 days supply | Qty: 30 | Fill #1

## 2015-08-26 MED FILL — FLUoxetine HCL 20 MG TABS: 20 | 30 days supply | Qty: 90 | Fill #1

## 2015-08-26 MED FILL — OXcarbazepine 300 MG TABS: 300 | 30 days supply | Qty: 90 | Fill #1

## 2015-08-26 NOTE — ED Provider Notes (Signed)
CSN: [id number]     Arrival date & time 08/26/15  1110 History   First MD Initiated Contact with Patient 08/26/15 1259     Chief Complaint  Patient presents with  . 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 Felt drunk," she endorsed brief blurred vision and a sensation where she had a "disconnect with my feet."  She denied any sensation of dizziness or spinning. The patient initially believe that she may because of hypoglycemia which she frequently has. She states that she left the patient's room and went and got orange juice.  Her symptoms did eat up after approximately 30 minutes and she was able to drive herself home without any difficulty however she states that she did have persistent and muscle aches, muscle weakness, intermittent tingling mostly in her right arm and leg and the symptoms lasted all night. She states the symptoms are what she has when her blood sugar is low and it usually resolves on its own. She states she woke up like normal this morning and drove to work however felt some dissociation and her mind and body describing it as "I couldn't remember if the last light was red or green," and "I had no concept of how fast it was going," explaining that she thought she was speeding however she was only going 25 miles per hour. She began working again as a Water quality scientist but experienced aloof thought and forgetfulness. She states that she would attempt to grab a blue top and she would end up grabbing a purple top. Or she did try to go to the sixth floor and she would end up at  the fifth floor.  She also endorses difficulty retrieving words quickly, however she has not had any slurred speech, facial droop, focal weakness, syncope, blurred vision, tunnel vision, headache.  She sees Dr. Lolly Mustache for her anxiety, depression and bipolar, she states she has been on multiple medications prolonged period of time and recently only changed one medicine approximately 2 and half months ago when Dr. morphine discontinued her Valium and put her on Klonopin instead.  She also discontinued Stelazine and began Abilify which has been working well for her in the last month a doctor added Vistaril as needed for breakthrough anxiety, which the patient states she has not been taking.  Patient denies any other medication changes. She denies any recent illness. She has no other acute complaints at this time.  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  Past Medical History  Diagnosis Date  . Kidney stone   . Anxiety   . Bipolar affective disorder (HCC)   . SBO (small bowel obstruction) (HCC) 01/2014  . Anemia   . Blood transfusion without reported diagnosis   . Depression    Past Surgical History  Procedure Laterality Date  . Cesarean section    . Gastric bypass  2001  . Uterine ablasion    .  Tubal ligation     Family History  Problem Relation Age of Onset  . Diabetes Mother   . Cancer Mother 60    Throat   . Bipolar disorder Mother   . Bipolar disorder Sister   . Bipolar disorder Brother   . Drug abuse Brother   . Bipolar disorder Sister    Social History  Substance Use Topics  . Smoking status: Current Every Day Smoker -- 0.50 packs/day    Last Attempt to Quit: 05/11/2014  . Smokeless tobacco: Never Used  . Alcohol Use: No   OB History    No data available     Review of Systems  All other systems reviewed and are  negative.     Allergies  Nsaids  Home Medications   Prior to Admission medications   Medication Sig Start Date End Date Taking? Authorizing Provider  amitriptyline (ELAVIL) 25 MG tablet Take 1 tablet (25 mg total) by mouth 3 (three) times daily. 07/28/15  Yes Cleotis Nipper, MD  ARIPiprazole (ABILIFY) 5 MG tablet Take 1 tablet (5 mg total) by mouth daily. 07/28/15 07/27/16 Yes Cleotis Nipper, MD  clonazePAM (KLONOPIN) 1 MG tablet Take 1 tablet (1 mg total) by mouth 3 (three) times daily. 07/28/15 07/27/16 Yes Cleotis Nipper, MD  FLUoxetine (PROZAC) 20 MG tablet Take 3 tablets (60 mg total) by mouth daily. 07/28/15  Yes Cleotis Nipper, MD  hydrOXYzine (VISTARIL) 100 MG capsule Take 1 capsule (100 mg total) by mouth 2 (two) times daily as needed for anxiety. 07/28/15  Yes Cleotis Nipper, MD  Oxcarbazepine (TRILEPTAL) 300 MG tablet Take 1 tablet (300 mg total) by mouth 3 (three) times daily. 07/28/15  Yes Cleotis Nipper, MD  trifluoperazine (STELAZINE) 2 MG tablet Take 1 tablet (2 mg total) by mouth at bedtime. 07/28/15  Yes Cleotis Nipper, MD  valACYclovir (VALTREX) 1000 MG tablet Take 1,000 mg by mouth daily.   Yes Historical Provider, MD  zolpidem (AMBIEN) 10 MG tablet Take 1 tablet (10 mg total) by mouth at bedtime. 07/28/15  Yes Cleotis Nipper, MD  nicotine (NICODERM CQ - DOSED IN MG/24 HOURS) 14 mg/24hr patch Place 1 patch (14 mg total) onto the skin daily. 07/27/15   Waldon Merl, PA-C   BP 127/86 mmHg  Pulse 69  Temp(Src) 97.9 F (36.6 C) (Oral)  Resp 14  Ht  (1.626 m)  Wt 99.791 kg  BMI 37.74 kg/m2  SpO2 96% Physical Exam  Constitutional: She is oriented to person, place, and time. She appears well-developed and well-nourished. No distress.  HENT:  Head: Normocephalic and atraumatic.  Right Ear: External ear normal.  Left Ear: External ear normal.  Nose: Nose normal.  Mouth/Throat: No oropharyngeal exudate.  Oral mucosa dry  Eyes: Conjunctivae and EOM are normal. Pupils are  equal, round, and reactive to light. Right eye exhibits no discharge. Left eye exhibits no discharge. No scleral icterus.  Neck: Normal range of motion. Neck supple. No JVD present. No tracheal deviation present.  Cardiovascular: Normal rate, regular rhythm, normal heart sounds and intact distal pulses.  Exam reveals no gallop and no friction rub.   No murmur heard. Pulmonary/Chest: Effort normal and breath sounds normal. No stridor. No respiratory distress. She has no wheezes. She has no rales. She exhibits no tenderness.  Abdominal: Soft. Bowel sounds are normal. She exhibits no distension. There is no tenderness.  Musculoskeletal: Normal range of motion. She exhibits no edema.  Lymphadenopathy:  She has no cervical adenopathy.  Neurological: She is alert and oriented to person, place, and time. She has normal reflexes. She is not disoriented. She displays no tremor and normal reflexes. No cranial nerve deficit or sensory deficit. She exhibits normal muscle tone. She displays a negative Romberg sign. Coordination and gait normal. GCS eye subscore is 4. GCS verbal subscore is 5. GCS motor subscore is 6.  Reflex Scores:      Patellar reflexes are 2+ on the right side and 2+ on the left side.      Achilles reflexes are 2+ on the right side and 2+ on the left side. Mental Status:  Alert, oriented, thought content appropriate, able to give a coherent history. Speech fluent, but slowed, without evidence of aphasia. Able to follow 2 step commands without difficulty.  Cranial Nerves:  II:  Peripheral visual fields grossly normal, pupils equal, round, reactive to light III,IV, VI: ptosis not present, extra-ocular motions intact bilaterally  V,VII: smile symmetric, facial light touch sensation equal VIII: hearing grossly normal to voice  X: uvula elevates symmetrically  XI: bilateral shoulder shrug symmetric and strong XII: midline tongue extension without fassiculations Motor:  Normal tone. 5/5 in  upper and lower extremities bilaterally including strong and equal grip strength and dorsiflexion/plantar flexion Sensory: Pinprick and light touch normal in all extremities.  Deep Tendon Reflexes: 2+ and symmetric in the biceps and patella Cerebellar: normal finger-to-nose with bilateral upper extremities Gait: normal gait and balance CV: distal pulses palpable throughout     Skin: Skin is warm and dry. No rash noted. She is not diaphoretic. No erythema. No pallor.  Psychiatric: She has a normal mood and affect. Judgment and thought content normal. She is slowed. She is not aggressive and not actively hallucinating. Thought content is not delusional. She expresses no homicidal and no suicidal ideation. She expresses no suicidal plans and no homicidal plans. She exhibits normal recent memory and normal remote memory.  Slowed speech without slur   Nursing note and vitals reviewed.   ED Course  Procedures (including critical care time) Labs Review Labs Reviewed  COMPREHENSIVE METABOLIC PANEL - Abnormal; Notable for the following:    Calcium 8.7 (*)    Total Protein 5.7 (*)    Albumin 3.2 (*)    All other components within normal limits  URINALYSIS, ROUTINE W REFLEX MICROSCOPIC (NOT AT [facility]) - Abnormal; Notable for the following:    APPearance HAZY (*)    Bilirubin Urine SMALL (*)    Ketones, ur 15 (*)    All other components within normal limits  URINE RAPID DRUG SCREEN, HOSP PERFORMED - Abnormal; Notable for the following:    Benzodiazepines POSITIVE (*)    All other components within normal limits  PROTIME-INR  APTT  CBC  DIFFERENTIAL  I-STAT TROPOININ, ED  CBG MONITORING, ED    Imaging Review Ct Head Wo Contrast  08/26/2015  CLINICAL DATA:  Stroke symptoms with dizziness and altered mental status. EXAM: CT HEAD WITHOUT CONTRAST TECHNIQUE: Contiguous axial images were obtained from the base of the skull through the vertex without intravenous contrast. COMPARISON:  None.  FINDINGS: Skull and Sinuses:Negative for fracture or destructive process. The visualized mastoids, middle ears, and imaged paranasal sinuses are clear. Visualized orbits: Negative. Brain: Normal. No evidence of acute infarction, hemorrhage, hydrocephalus, or mass lesion/mass effect. IMPRESSION: Normal study. Electronically Signed   By: Marnee SpringJonathon  Watts M.D.   On: 08/26/2015 12:58   I have personally reviewed and evaluated these  images and lab results as part of my medical decision-making.   EKG Interpretation   Date/Time:  Thursday August 26 2015 11:54:08 EDT Ventricular Rate:  75 PR Interval:  146 QRS Duration: 84 QT Interval:  396 QTC Calculation: 442 R Axis:   66 Text Interpretation:  Normal sinus rhythm Normal ECG No acute changes No  significant change since last tracing Confirmed by NANAVATI, MD, ANKIT  [phone]) on 08/26/2015 2:28:43 PM      MDM   Pt with 1 d of multiple vague symptoms, such as confusion, "feeling drunk", also intermittent tingling, weakness, balance issues.  She experienced these symptoms at work 2 days in a row, works at the same facility and was encouraged to come to the ER for evaluation, otherwise she states she would have not come in.  Basic labs, EKG, urinalysis, drug screen, cardiac enzymes and head CT were ordered as well as orthostatic vital signs.  Initially stroke order set was initiated.  Patient's workup was largely unremarkable, she had no electrolyte abnormality, no signs or symptoms of infection, urinalysis is pertinent for benzos which she is prescribed. Head CT was negative, EKG was normal sinus rhythm. Patient's had no neurological deficit, she is able to answer questions appropriately, speech is clear and articulate, no slurring or aphasia.  Patient stated it felt like and she has low blood sugar, blood sugar was 82.  Patient is on several psychiatric medications, and does not appear to have any that would cause serotonin syndrome, her reflexes were  normal she did not have any fever, felt this was less likely, however given the medications I did contact her psychiatrist.  4:12 PM discussed case and meds with Pt's psychiatrist, Dr. Lolly Mustache, who was unconcerned with patient's presentation, Dr. Lolly Mustache questioned whether her symptoms would be somatic.  Pt denies SI, HI, AVH, she did not complain of any recent stressors. Dr. Lolly Mustache advised that pt should see a therapist, has appointment next week, which she should keep.  He did not suggest any medication changes.  She can follow up with him routinely.  Pt was given strict return precautions, advised to see PCP in the next several days for recheck.  Return precautions were reviewed. Patient was critical discharging home. She was able to eat and drink in the ER without any difficulty.  Her vital signs are stable throughout her stay in the ER, she was able in today without difficulty multiple times.  She was discharged home in good condition.  Filed Vitals:   08/26/15 1415 08/26/15 1430 08/26/15 1445 08/26/15 1538  BP: 102/71 98/80 118/75 127/86  Pulse: 69 67 71 69  Temp:    97.9 F (36.6 C)  TempSrc:    Oral  Resp: 19 16 19 14   Height:      Weight:      SpO2: 99% 99% 100% 96%     Final diagnoses:  Confusion with non-focal neuro exam      Danelle Berry, PA-C 08/29/15 0914  Derwood Kaplan, MD 08/29/15 1403

## 2015-08-26 NOTE — Discharge Instructions (Signed)
Confusion Confusion is the inability to think with your usual speed or clarity. Confusion may come on quickly or slowly over time. How quickly the confusion comes on depends on the cause. Confusion can be due to any number of causes. CAUSES   Concussion, head injury, or head trauma.  Seizures.  Stroke.  Fever.  Brain tumor.  Age related decreased brain function (dementia).  Heightened emotional states like rage or terror.  Mental illness in which the person loses the ability to determine what is real and what is not (hallucinations).  Infections such as a urinary tract infection (UTI).  Toxic effects from alcohol, drugs, or prescription medicines.  Dehydration and an imbalance of salts in the body (electrolytes).  Lack of sleep.  Low blood sugar (diabetes).  Low levels of oxygen from conditions such as chronic lung disorders.  Drug interactions or other medicine side effects.  Nutritional deficiencies, especially niacin, thiamine, vitamin C, or vitamin B.  Sudden drop in body temperature (hypothermia).  Change in routine, such as when traveling or hospitalized. SIGNS AND SYMPTOMS  People often describe their thinking as cloudy or unclear when they are confused. Confusion can also include feeling disoriented. That means you are unaware of where or who you are. You may also not know what the date or time is. If confused, you may also have difficulty paying attention, remembering, and making decisions. Some people also act aggressively when they are confused.  DIAGNOSIS  The medical evaluation of confusion may include:  Blood and urine tests.  X-rays.  Brain and nervous system tests.  Analyzing your brain waves (electroencephalogram or EEG).  Magnetic resonance imaging (MRI) of your head.  Computed tomography (CT) scan of your head.  Mental status tests in which your health care provider may ask many questions. Some of these questions may seem silly or strange,  but they are a very important test to help diagnose and treat confusion. TREATMENT  An admission to the hospital may not be needed, but a person with confusion should not be left alone. Stay with a family member or friend until the confusion clears. Avoid alcohol, pain relievers, or sedative drugs until you have fully recovered. Do not drive until directed by your health care provider. HOME CARE INSTRUCTIONS  What family and friends can do:  To find out if someone is confused, ask the person to state his or her name, age, and the date. If the person is unsure or answers incorrectly, he or she is confused.  Always introduce yourself, no matter how well the person knows you.  Often remind the person of his or her location.  Place a calendar and clock near the confused person.  Help the person with his or her medicines. You may want to use a pill box, an alarm as a reminder, or give the person each dose as prescribed.  Talk about current events and plans for the day.  Try to keep the environment calm, quiet, and peaceful.  Make sure the person keeps follow-up visits with his or her health care provider. PREVENTION  Ways to prevent confusion:  Avoid alcohol.  Eat a balanced diet.  Get enough sleep.  Take medicine only as directed by your health care provider.  Do not become isolated. Spend time with other people and make plans for your days.  Keep careful watch on your blood sugar levels if you are diabetic. SEEK IMMEDIATE MEDICAL CARE IF:   You develop severe headaches, repeated vomiting, seizures, blackouts, or  slurred speech.  There is increasing confusion, weakness, numbness, restlessness, or personality changes.  You develop a loss of balance, have marked dizziness, feel uncoordinated, or fall.  You have delusions, hallucinations, or develop severe anxiety.  Your family members think you need to be rechecked.   This information is not intended to replace advice given  to you by your health care provider. Make sure you discuss any questions you have with your health care provider.   Document Released: 06/08/2004 Document Revised: 05/22/2014 Document Reviewed: 06/06/2013 Elsevier Interactive Patient Education [employer]  Delirium Delirium is a state of mental confusion. It comes on quickly and causes significant changes in a person's thinking and behavior. People with delirium usually have trouble paying attention to what is going on or knowing where they are. They may become very withdrawn or very emotional and unable to sit still. They may even see or feel things that are not there (hallucinations). Delirium is a sign of a serious underlying medical condition. CAUSES Delirium occurs when something suddenly affects the signals that the brain sends out. Brain signals can be affected by anything that puts severe stress on the body and brain and causes brain chemicals to be out of balance. The most common causes of delirium include:  Infections. These may be bacterial, viral, fungal, or protozoal.  Medicines. These include many over-the-counter and prescription medicines.  Recreational drugs.  Substance withdrawal. This occurs with sudden discontinuation of alcohol, certain medicines, or recreational drugs.  Surgery.  Sudden vascular events, such as stroke, brain hemorrhage, and severe migraine.  Other brain disorders, such as tumors, seizures, and physical head trauma.  Metabolic disorders, such as kidney or liver failure.  Low blood oxygen (anoxia). This may occur with lung disease, cardiac arrest, or carbon monoxide poisoning.  Hormone imbalances (endocrinopathies), such as an overactive thyroid (hyperthyroidism) or underactive thyroid (hypothyroidism).  Vitamin deficiencies. RISK FACTORS This condition is more likely to develop in:  Children.  Older people.  People who live alone.  People who have vision loss or hearing  loss.  People who have existing brain disease, such as dementia.  People who have long-lasting (chronic) medical conditions, such as heart disease.  People who are hospitalized for long periods of time. SYMPTOMS Delirium starts with a sudden change in a person's thinking or behavior. Symptoms come and go (fluctuate) over time, and they are often worse at the end of the day. Symptoms include:  Not being able to stay awake (drowsiness) or pay attention.  Being confused about places, time, and people.  Forgetfulness.  Having extreme energy levels. These may be low or high.  Changes in sleep patterns.  Extreme mood swings, such as anger or anxiety.  Focusing on things or ideas that are not important.  Rambling and senseless talking.  Difficulty speaking, understanding speech, or both.  Hallucinations.  Tremor or unsteady gait. DIAGNOSIS People with delirium may not realize that they have the condition. Often, a family member or health care provider is the first person to notice the changes. The health care provider will obtain a detailed history of current symptoms, medical issues, medicines, and recreational drug use. The health care provider will perform a mental status examination by:  Asking questions to check for confusion.  Watching for abnormal behavior. The health care provider may perform a physical exam and order lab tests or additional studies to determine the cause of the delirium. TREATMENT Treatment of delirium depends on the cause and severity. Delirium usually goes  away within days or weeks of treating the underlying cause. In the meantime, the person should not be left alone because he or she may accidentally cause self-harm. Treatment includes supportive care, such as:  Increased light during the day and decreased light at night.  Low noise level.  Uninterrupted sleep.  A regular daily schedule.  Clocks and calendars to help with orientation.  Familiar  objects, including the person's pictures and clothing.  Frequent visits from familiar family and friends.  Healthy diet.  Exercise. In more severe cases of delirium, medicine may be prescribed to help the person to keep calm and think more clearly. HOME CARE INSTRUCTIONS  Any supportive care should be continued as told by the health care provider.  All medicines should be used as told by the health care provider. This is important.  The health care provider should be consulted before over-the-counter medicines, herbs, or supplements are used.  All follow-up visits should be kept as told by the health care provider. This is important.  Alcohol and recreational drugs should be avoided as told by the health care provider. SEEK MEDICAL CARE IF:  Symptoms do not get better or they become worse.  New symptoms of delirium develop.  Caring for the person at home does not seem safe.  Eating, drinking, or communicating stops.  There are side effects of medicines, such as changes in sleep patterns, dizziness, weight gain, restlessness, movement changes, or tremors. SEEK IMMEDIATE MEDICAL CARE IF:  Serious thoughts occur about self-harm or about hurting others.  There are serious side effects of medicine, such as:  Swelling of the face, lips, tongue, or throat.  Fever, confusion, muscle spasms, or seizures.   This information is not intended to replace advice given to you by your health care provider. Make sure you discuss any questions you have with your health care provider.   Document Released: 01/24/2012 Document Revised: 09/15/2014 Document Reviewed: 06/24/2014 Elsevier Interactive Patient Education [employer]

## 2015-08-26 NOTE — ED Notes (Signed)
Patient reports that she developed unsteady gait and "drunk" since yesterday. Thought she was hypoglycemic but is not. Answers questions correctly but reports that she feels weak.  Bilateral leg tingling with same

## 2015-09-01 ENCOUNTER — Encounter (HOSPITAL_COMMUNITY): Payer: Self-pay | Admitting: Clinical

## 2015-09-01 ENCOUNTER — Ambulatory Visit (INDEPENDENT_AMBULATORY_CARE_PROVIDER_SITE_OTHER): Payer: 59 | Admitting: Clinical

## 2015-09-01 DIAGNOSIS — F3162 Bipolar disorder, current episode mixed, moderate: Secondary | ICD-10-CM

## 2015-09-01 DIAGNOSIS — F603 Borderline personality disorder: Secondary | ICD-10-CM | POA: Diagnosis not present

## 2015-09-01 NOTE — Progress Notes (Signed)
   THERAPIST PROGRESS NOTE  Session Time: 8:03 - 8:59  Participation Level: Active  Behavioral Response: CasualAlertNA  Type of Therapy: Individual Therapy  Treatment Goals addressed: improve psychiatric symptoms, emotional regulation, improve unhelpful thiought patterns, interpersonal relationship skills.   Interventions: Motivational Interviewing, CBT, Grounding Techniques  Summary: Ashley Franklin is a 44 y.o. female who presents with Bipolar Disorder, Mixed Moderate and Borderline Personality Disorder.   Suicidal/Homicidal: Nowithout intent/plan  Therapist Response: Ashley Franklin met with clinician for an individual session. Ashley Franklin and clinician discussed her goals for therapy. Ashley Franklin shared that she would like to be able to develop healthy relationships. Ashley Franklin shared about some of her relationships. She shared about some of her behaviors that she recognizes as destructive to her relationships.Clinician asked open ended questions and Ashley Franklin shared her thoughts and views about relationships and her behaviors. She shared about her bipolar and borderline personality symptoms. She shared that her mood swings cause her relationships to be unstable. Clinician introduced some grounding  Techniques. Clinician explained the process, purpose and practice of the techniques. Client and clinician practiced some of the techniques together. Ashley Franklin asked clarifying questions. Clinician also introduced some basic cbt concepts. Client and clinician discussed how our thoughts affect our emotions and our experiences. Ashley Franklin agreed to practice her grounding techniques until next session  Plan: Return again in  1-2 weeks.  Diagnosis: Axis I: Bipolar Disorder, Mixed Moderate and Borderline Personality Disorder.        , A, LCSW 09/01/2015  

## 2015-09-15 ENCOUNTER — Ambulatory Visit (INDEPENDENT_AMBULATORY_CARE_PROVIDER_SITE_OTHER): Payer: 59 | Admitting: Clinical

## 2015-09-15 DIAGNOSIS — F3162 Bipolar disorder, current episode mixed, moderate: Secondary | ICD-10-CM | POA: Diagnosis not present

## 2015-09-15 DIAGNOSIS — F603 Borderline personality disorder: Secondary | ICD-10-CM | POA: Diagnosis not present

## 2015-09-15 NOTE — Progress Notes (Addendum)
   THERAPIST PROGRESS NOTE  Session Time: 8:00 - 8:57  Participation Level: Active  Behavioral Response: CasualAlertDepressed  Type of Therapy: Individual Therapy  Treatment Goals addressed: improve psychiatric symptoms,  interpersonal relationship skills,  learn about diagnosis, healthy coping skill.   Interventions: Motivational Interviewing, CBT, Grounding Techniques  Summary: Ashley Franklin is Franklin 45 y.o. female who presents with Bipolar Disorder, Mixed Moderate and Borderline Personality Disorder.   Suicidal/Homicidal: Nowithout intent/plan  Therapist Response: Ashley Franklin met with clinician for an individual session. Ashley Franklin and clinician discussed her psychiatric symptoms, current life event and goals for therapy. Ashley Franklin shared that she has continued to experience her psychiatric symptoms. She shared that she has completed her homework  (bipolar 1 & 2) Client and clinician reviewed and discussed her homework. Ashley Franklin shared her thoughts and insights about her homework. Clinician gave her packet #3 which she agreed to complete before next session. She shared that she has not been practicing her grounding or mindfulness techniques. Client and clinician discussed her resistance to practicing the techniques Client and clinician discussed the techniques  - the process, practice and purpose of the techniques. Ashley Franklin agreed to practice the techniques daily until next session as well as complete packet #3 bipolar. Client and clinician discussed some of the negative thought processes and how to identify challenge and change those thoughts. Ashley Franklin shared how her psychiatric symptoms are affecting her relationships. Client and clinician discussed healthy interpersonal elationship skills.       Plan: Return again in 1-2 weeks.  Diagnosis:Axis I: Bipolar Disorder, Mixed Moderate and Borderline Personality Disorder.   Ashley Dauria A, LCSW 09/15/2015

## 2015-09-20 ENCOUNTER — Encounter (HOSPITAL_COMMUNITY): Payer: Self-pay | Admitting: Clinical

## 2015-09-22 ENCOUNTER — Ambulatory Visit (HOSPITAL_COMMUNITY): Payer: Self-pay | Admitting: Clinical

## 2015-09-22 ENCOUNTER — Other Ambulatory Visit (HOSPITAL_COMMUNITY): Payer: Self-pay | Admitting: Psychiatry

## 2015-09-22 MED FILL — clonazePAM 1 MG TABS: 1 | 30 days supply | Qty: 90 | Fill #1

## 2015-09-22 MED FILL — ZOLPIDEM TARTRATE 10 MG TAB: 10 | 30 days supply | Qty: 30 | Fill #1

## 2015-09-29 ENCOUNTER — Encounter (HOSPITAL_COMMUNITY): Payer: Self-pay | Admitting: Psychiatry

## 2015-09-29 ENCOUNTER — Ambulatory Visit (INDEPENDENT_AMBULATORY_CARE_PROVIDER_SITE_OTHER): Payer: 59 | Admitting: Psychiatry

## 2015-09-29 VITALS — BP 126/74 | HR 79 | Ht 64.0 in | Wt 248.8 lb

## 2015-09-29 DIAGNOSIS — F411 Generalized anxiety disorder: Secondary | ICD-10-CM | POA: Diagnosis not present

## 2015-09-29 DIAGNOSIS — F3131 Bipolar disorder, current episode depressed, mild: Secondary | ICD-10-CM

## 2015-09-29 MED ORDER — DIAZEPAM 5 MG PO TABS: 5.0000 mg | ORAL_TABLET | Freq: Three times a day (TID) | ORAL | Status: DC

## 2015-09-29 MED ORDER — QUETIAPINE FUMARATE 200 MG PO TABS: 200.0000 mg | ORAL_TABLET | Freq: Every day | ORAL | Status: DC

## 2015-09-29 MED ORDER — OXCARBAZEPINE 300 MG PO TABS: 300.0000 mg | ORAL_TABLET | Freq: Three times a day (TID) | ORAL | Status: DC

## 2015-09-29 MED ORDER — TRIFLUOPERAZINE HCL 2 MG PO TABS: 2.0000 mg | ORAL_TABLET | Freq: Every day | ORAL | Status: DC

## 2015-09-29 MED ORDER — ZOLPIDEM TARTRATE 10 MG PO TABS: 10.0000 mg | ORAL_TABLET | Freq: Every day | ORAL | Status: DC

## 2015-09-29 MED ORDER — AMITRIPTYLINE HCL 25 MG PO TABS: 25.0000 mg | ORAL_TABLET | Freq: Three times a day (TID) | ORAL | Status: DC

## 2015-09-29 MED ORDER — FLUOXETINE HCL 20 MG PO TABS: 60.0000 mg | ORAL_TABLET | Freq: Every day | ORAL | Status: DC

## 2015-09-29 MED FILL — diazePAM 5 MG TABS: 5 | 30 days supply | Qty: 90 | Fill #0

## 2015-09-29 MED FILL — QUETIAPINE FUMARATE 200 MG: 200 | 30 days supply | Qty: 30 | Fill #0

## 2015-09-29 MED FILL — AMITRIPTYLINE HCL 25 MG TAB: 25 | 30 days supply | Qty: 90 | Fill #0

## 2015-09-29 MED FILL — OXcarbazepine 300 MG TABS: 300 | 30 days supply | Qty: 90 | Fill #0

## 2015-09-29 MED FILL — TRIFLUOPERAZINE 2 MG TABLET: 2 | 30 days supply | Qty: 30 | Fill #0

## 2015-09-29 MED FILL — FLUoxetine HCL 20 MG TABS: 20 | 30 days supply | Qty: 90 | Fill #0

## 2015-09-29 NOTE — Progress Notes (Signed)
[facility] [id number] Progress Note  Ashley Franklin [id number] 45 y.o.  09/29/2015 10:52 AM  Chief Complaint:  I am not feeling well.  My medicine making me more irritable and agitated.  I'm very sensitive with lites, sounds, noises.  I'm very overwhelmed at work.      History of Present Illness:  Ashley Franklin came for her follow-up appointment.  She is taking Abilify, Klonopin, Trileptal, Ambien, hydroxyzine, 2 [place] .  Since taking Abilify she has noticed energy increase but more irritable, agitated, nervousness and anxiety.  We recommended hydroxyzine to help the anxiety symptoms but she do not see any improvement.  She endorse lately very sensitive with light, noises, sounds in the hospital.  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  She used to take Seroquel 200 mg at bedtime and Valium 10 mg 3 times a day.  However she requested to switch her medicine to Abilify as she had a good response in the past.  We tried Abilify but she has not doing very well.  She continues to scratch herself superficially that does not require any stitches.  She admitted she gets very frustrated when she scratched herself.  She denies any suicidal thoughts or homicidal thought.  She has no tremors or shakes.  She is seeing Tomma Lightning for counseling and she like to continue therapy for now.  Her appetite is okay.  Her vitals are stable.  Patient denies drinking alcohol or using any illegal substances.  She is disappointed that Abilify did not work.  I offered her to go back on Seroquel and Valium which had helped her in the past.  She lives with her husband.    Suicidal Ideation: No Plan Formed: No Patient has means to carry out plan: No  Homicidal Ideation: No Plan Formed: No Patient has means to carry out plan: No  Past Psychiatric History/Hospitalization(s): She has been taking antidepressants since 2005 .  Patient has one psychiatric hospitalization in March 2015 when  she was very depressed and could not able to take care of herself. She had tried Lexapro, Paxil, Vistaril but limited response.  She denies any history of suicidal attempt but endorsed severe depression and manic symptoms on and off.  Her symptoms started to get worst when her husband left in 2010. She admitted history of mania, sexual inappropriate behavior and involvement in a relationship with married man, severe impulsive behavior, irritability, severe mood swing, anger issues and grandiosity.  She tried Abilify and Klonopin with good response however she could not afford Abilify.  Recently we tried again but this time it did not help her and make her more irritable.  She was diagnosed with borderline percent disorder but denies any history of cutting or self abusive behavior.  She endorse history of panic attack and anxiety attack and there are times that she is obsessed about cleaning, checking and counting. Anxiety: Yes Bipolar Disorder: Yes Depression: Yes Mania: Yes Psychosis: No Schizophrenia: No Personality Disorder: Diagnosed borderline personality disorder Hospitalization for psychiatric illness: Yes History of Electroconvulsive Shock Therapy: No Prior Suicide Attempts: No  Medical History; Patient has history of anemia, obesity, history of gastric bypass surgery, kidney stone, blood transfusion and small bowel obstruction.  She see Dr. Marcelline Mates.  She has gained more than 60 pounds in past one year.  Her TSH is normal.  Patient denies any history of seizures.  Family History; Patient endorse her great aunt has schizophrenia.  She endorse multiple  family member from her mother's side has mental disorder but they are in denial  Review of Systems  Skin: Negative for itching and rash.  Neurological: Negative for dizziness, tremors and headaches.  Psychiatric/Behavioral: Positive for depression. Negative for suicidal ideas and substance abuse. The patient is nervous/anxious and has  insomnia.     Psychiatric: Agitation: Irritability Hallucination: No Depressed Mood: Yes Insomnia: Yes Hypersomnia: No Altered Concentration: No Feels Worthless: No Grandiose Ideas: No Belief In Special Powers: No New/Increased Substance Abuse: No Compulsions: No  Neurologic: Headache: No Seizure: No Paresthesias: No   Outpatient Encounter Prescriptions as of 09/29/2015  Medication Sig  . amitriptyline (ELAVIL) 25 MG tablet Take 1 tablet (25 mg total) by mouth 3 (three) times daily.  Marland Kitchen FLUoxetine (PROZAC) 20 MG tablet Take 3 tablets (60 mg total) by mouth daily.  . nicotine (NICODERM CQ - DOSED IN MG/24 HOURS) 14 mg/24hr patch Place 1 patch (14 mg total) onto the skin daily.  . Oxcarbazepine (TRILEPTAL) 300 MG tablet Take 1 tablet (300 mg total) by mouth 3 (three) times daily.  Marland Kitchen trifluoperazine (STELAZINE) 2 MG tablet Take 1 tablet (2 mg total) by mouth at bedtime.  . valACYclovir (VALTREX) 1000 MG tablet Take 1,000 mg by mouth daily.  Marland Kitchen zolpidem (AMBIEN) 10 MG tablet Take 1 tablet (10 mg total) by mouth at bedtime.  . [DISCONTINUED] amitriptyline (ELAVIL) 25 MG tablet Take 1 tablet (25 mg total) by mouth 3 (three) times daily.  . [DISCONTINUED] ARIPiprazole (ABILIFY) 5 MG tablet Take 1 tablet (5 mg total) by mouth daily.  . [DISCONTINUED] clonazePAM (KLONOPIN) 1 MG tablet Take 1 tablet (1 mg total) by mouth 3 (three) times daily.  . [DISCONTINUED] FLUoxetine (PROZAC) 20 MG tablet Take 3 tablets (60 mg total) by mouth daily.  . [DISCONTINUED] hydrOXYzine (VISTARIL) 100 MG capsule Take 1 capsule (100 mg total) by mouth 2 (two) times daily as needed for anxiety.  . [DISCONTINUED] Oxcarbazepine (TRILEPTAL) 300 MG tablet Take 1 tablet (300 mg total) by mouth 3 (three) times daily.  . [DISCONTINUED] trifluoperazine (STELAZINE) 2 MG tablet Take 1 tablet (2 mg total) by mouth at bedtime.  . [DISCONTINUED] zolpidem (AMBIEN) 10 MG tablet Take 1 tablet (10 mg total) by mouth at bedtime.   . diazepam (VALIUM) 5 MG tablet Take 1 tablet (5 mg total) by mouth 3 (three) times daily.  . QUEtiapine (SEROQUEL) 200 MG tablet Take 1 tablet (200 mg total) by mouth at bedtime.   No facility-administered encounter medications on file as of 09/29/2015.    Recent Results (from the past 2160 hour(s))  Protime-INR     Status: None   Collection Time: 08/26/15 11:53 AM  Result Value Ref Range   Prothrombin Time 13.2 11.6 - 15.2 seconds   INR 0.98 0.00 - 1.49  APTT     Status: None   Collection Time: 08/26/15 11:53 AM  Result Value Ref Range   aPTT 32 24 - 37 seconds  CBC     Status: None   Collection Time: 08/26/15 11:53 AM  Result Value Ref Range   WBC 6.8 4.0 - 10.5 K/uL   RBC 4.44 3.87 - 5.11 MIL/uL   Hemoglobin 13.2 12.0 - 15.0 g/dL   HCT 19.1 99.1 - 81.1 %   MCV 88.3 78.0 - 100.0 fL   MCH 29.7 26.0 - 34.0 pg   MCHC 33.7 30.0 - 36.0 g/dL   RDW 27.9 36.8 - 28.6 %   Platelets 312 150 - 400 K/uL  Differential     Status: None   Collection Time: 08/26/15 11:53 AM  Result Value Ref Range   Neutrophils Relative % 59 %   Neutro Abs 3.9 1.7 - 7.7 K/uL   Lymphocytes Relative 30 %   Lymphs Abs 2.0 0.7 - 4.0 K/uL   Monocytes Relative 7 %   Monocytes Absolute 0.5 0.1 - 1.0 K/uL   Eosinophils Relative 4 %   Eosinophils Absolute 0.3 0.0 - 0.7 K/uL   Basophils Relative 0 %   Basophils Absolute 0.0 0.0 - 0.1 K/uL  Comprehensive metabolic panel     Status: Abnormal   Collection Time: 08/26/15 11:53 AM  Result Value Ref Range   Sodium 142 135 - 145 mmol/L   Potassium 5.0 3.5 - 5.1 mmol/L   Chloride 109 101 - 111 mmol/L   CO2 24 22 - 32 mmol/L   Glucose, Bld 83 65 - 99 mg/dL   BUN 14 6 - 20 mg/dL   Creatinine, Ser 0.80 0.44 - 1.00 mg/dL   Calcium 8.7 (L) 8.9 - 10.3 mg/dL   Total Protein 5.7 (L) 6.5 - 8.1 g/dL   Albumin 3.2 (L) 3.5 - 5.0 g/dL   AST 20 15 - 41 U/L   ALT 16 14 - 54 U/L   Alkaline Phosphatase 56 38 - 126 U/L   Total Bilirubin 0.5 0.3 - 1.2 mg/dL   GFR calc non Af  Amer >60 >60 mL/min   GFR calc Af Amer >60 >60 mL/min    Comment: (NOTE) The eGFR has been calculated using the CKD EPI equation. This calculation has not been validated in all clinical situations. eGFR's persistently <60 mL/min signify possible Chronic Kidney Disease.    Anion gap 9 5 - 15  I-stat troponin, ED (not at [facility], [facility])     Status: None   Collection Time: 08/26/15 12:02 PM  Result Value Ref Range   Troponin i, poc 0.00 0.00 - 0.08 ng/mL   Comment 3            Comment: Due to the release kinetics of cTnI, a negative result within the first hours of the onset of symptoms does not rule out myocardial infarction with certainty. If myocardial infarction is still suspected, repeat the test at appropriate intervals.   Urinalysis, Routine w reflex microscopic (not at [facility])     Status: Abnormal   Collection Time: 08/26/15  2:30 PM  Result Value Ref Range   Color, Urine YELLOW YELLOW   APPearance HAZY (A) CLEAR   Specific Gravity, Urine 1.029 1.005 - 1.030   pH 5.5 5.0 - 8.0   Glucose, UA NEGATIVE NEGATIVE mg/dL   Hgb urine dipstick NEGATIVE NEGATIVE   Bilirubin Urine SMALL (A) NEGATIVE   Ketones, ur 15 (A) NEGATIVE mg/dL   Protein, ur NEGATIVE NEGATIVE mg/dL   Nitrite NEGATIVE NEGATIVE   Leukocytes, UA NEGATIVE NEGATIVE    Comment: MICROSCOPIC NOT DONE ON URINES WITH NEGATIVE PROTEIN, BLOOD, LEUKOCYTES, NITRITE, OR GLUCOSE <1000 mg/dL.  Urine rapid drug screen (hosp performed)     Status: Abnormal   Collection Time: 08/26/15  2:30 PM  Result Value Ref Range   Opiates NONE DETECTED NONE DETECTED   Cocaine NONE DETECTED NONE DETECTED   Benzodiazepines POSITIVE (A) NONE DETECTED   Amphetamines NONE DETECTED NONE DETECTED   Tetrahydrocannabinol NONE DETECTED NONE DETECTED   Barbiturates NONE DETECTED NONE DETECTED    Comment:        DRUG SCREEN FOR MEDICAL PURPOSES ONLY.  IF CONFIRMATION IS NEEDED FOR ANY PURPOSE, NOTIFY LAB WITHIN 5 DAYS.        LOWEST DETECTABLE  LIMITS FOR URINE DRUG SCREEN Drug Class       Cutoff (ng/mL) Amphetamine      1000 Barbiturate      200 Benzodiazepine   858 Tricyclics       850 Opiates          300 Cocaine          300 THC              50   POC CBG, ED     Status: None   Collection Time: 08/26/15  3:42 PM  Result Value Ref Range   Glucose-Capillary 82 65 - 99 mg/dL      Constitutional:  BP 126/74 mmHg  Pulse 79  Ht '5\' 4"'$  (1.626 m)  Wt 248 lb 12.8 oz (112.855 kg)  BMI 42.69 kg/m2   Musculoskeletal: Strength & Muscle Tone: within normal limits Gait & Station: normal Patient leans: N/A  Psychiatric Specialty Exam: General Appearance: Casual and Obese  Eye Contact::  Fair  Speech:  Normal Rate  Volume:  Normal  Mood:  Anxious and Depressed  Affect:  Appropriate  Thought Process:  Coherent  Orientation:  Full (Time, Place, and Person)  Thought Content:  Rumination  Suicidal Thoughts:  No  Homicidal Thoughts:  No  Memory:  Immediate;   Fair Recent;   Fair Remote;   Fair  Judgement:  Fair  Insight:  Fair  Psychomotor Activity:  Normal  Concentration:  Fair  Recall:  [employer] of Knowledge:  Fair  Language:  Fair  Akathisia:  No  Handed:  Right  AIMS (if indicated):     Assets:  Communication Skills Desire for Improvement Financial Resources/Insurance Housing Social Support  ADL's:  Intact  Cognition:  Impaired,  Mild  Sleep:        Established Problem, Stable/Improving (1), Review or order clinical lab tests (1), Review and summation of old records (2), Established Problem, Worsening (2), Review of Last Therapy Session (1), Review of Medication Regimen & Side Effects (2) and Review of New Medication or Change in Dosage (2)  Assessment: Axis I: Bipolar disorder, mixed.  Anxiety disorder NOS  Axis II: Rule out borderline personality disorder  Axis III:  Past Medical History  Diagnosis Date  . Kidney stone   . Anxiety   . Bipolar affective disorder ([place])   . SBO (small bowel  obstruction) ([place]) 01/2014  . Anemia   . Blood transfusion without reported diagnosis   . Depression      Plan:  Patient not doing better on Abilify and Klonopin.  I will discontinue Abilify and Klonopin and restart Seroquel 200 mg at bedtime and Valium 5 mg 3 times a day.  I will also discontinue Vistaril which was given on the last visit to help the side effects of Abilify.  She will continue Trileptal, Stelazine, Ambien and amitriptyline.  We will consider reducing Stelazine on her next appointment.  Encouraged to keep appointment with [facility] for counseling.  Discussed medication side effects and benefits. Recommended to call us back if she has any question or any concern.  Follow-up in 8 weeks. Discuss in detail safety plan that anytime having active suicidal thoughts or homicidal thoughts and she need to call 911 or go to local emergency room.  Discussed benzodiazepine dependence, tolerance and withdrawal.    ARFEEN,SYED T., MD 09/29/2015

## 2015-10-06 ENCOUNTER — Encounter (HOSPITAL_COMMUNITY): Payer: Self-pay | Admitting: Clinical

## 2015-10-06 ENCOUNTER — Ambulatory Visit (INDEPENDENT_AMBULATORY_CARE_PROVIDER_SITE_OTHER): Payer: 59 | Admitting: Clinical

## 2015-10-06 DIAGNOSIS — F603 Borderline personality disorder: Secondary | ICD-10-CM

## 2015-10-06 DIAGNOSIS — F3162 Bipolar disorder, current episode mixed, moderate: Secondary | ICD-10-CM | POA: Diagnosis not present

## 2015-10-06 NOTE — Progress Notes (Signed)
THERAPIST PROGRESS NOTE  Session Time: 8:05 -9:03  Participation Level: Active  Behavioral Response: DisheveledAlertAnxious and Irritable  Type of Therapy: Individual Therapy  Treatment Goals addressed: improve psychiatric symptoms, interpersonal relationship skills , improve unhelpful thought patterns, emotional regulation skills , elevate mood  (decreased irritability),  healthy coping skills  Interventions: Motivational Interviewing, CBT, Grounding Techniques  Summary: Denea S Kinnan is a 44 y.o. female who presents with Bipolar Disorder, Mixed Moderate and Borderline Personality Disorder.   Suicidal/Homicidal: No without intent/plan  Therapist Response: Albirda met with clinician for an individual session. Jailey shared about her psychiatric symptoms, Current life events and her homework. She shared that she did not complete her homework but plans to bring it next time. She shared that she had a rage at her husband that was yelling and them 5-6 days of cold shoulder. Client and clinician discussed her thoughts and emotions during a rage and when punishing. Client and clinician discussed the long-term effects of raging on her relationships. Clinician asked open ended questions and Comfort shared both about her own emotions and thoughts and a bout the way her behaviors have a affected her relationship with her children and her husband. Jenifer shared a bout her mother. She shared a bout how her mother's behavior has affected her and her mother's husband. Ayjah stated that she does not want to be like that. But her own behavior is creating a similar life. Client and clinician discussed the event that happened prior to the rage. Jenifer shared her negative automatic thoughts that caused her to react the way she did. Client and clinician used a 7 panel thought record sheet to begin a discussion a bout her negative thoughts and perceptions. Jenifer identified one of her thoughts, and  clinician showed her how to use a 7 panel thought record sheet to challenge those thoughts. Client and clinician agreed to discuss this further at a future session. Client and clinician discussed some healthy coping skills.    Plan: Return again in 1 -2 weeks.  Diagnosis: Axis I: Bipolar Disorder, Mixed Moderate and Borderline Personality Disorder.    , A, LCSW 10/06/2015  

## 2015-10-08 ENCOUNTER — Other Ambulatory Visit (HOSPITAL_COMMUNITY): Payer: Self-pay | Admitting: Psychiatry

## 2015-10-08 NOTE — Telephone Encounter (Signed)
It was discontinued.

## 2015-10-13 ENCOUNTER — Institutional Professional Consult (permissible substitution): Payer: 59 | Admitting: Internal Medicine

## 2015-10-18 MED FILL — ZOLPIDEM TARTRATE 10 MG TAB: 10 | 30 days supply | Qty: 30 | Fill #0

## 2015-10-26 MED FILL — valACYclovir HCL 1 GM TABS: 1 | 30 days supply | Qty: 30 | Fill #5

## 2015-10-26 MED FILL — diazePAM 5 MG TABS: 5 | 30 days supply | Qty: 90 | Fill #1

## 2015-11-08 MED FILL — QUETIAPINE FUMARATE 200 MG: 200 | 30 days supply | Qty: 30 | Fill #1

## 2015-11-17 MED FILL — OXcarbazepine 300 MG TABS: 300 | 30 days supply | Qty: 90 | Fill #1

## 2015-11-17 MED FILL — ZOLPIDEM TARTRATE 10 MG TAB: 10 | 30 days supply | Qty: 30 | Fill #1

## 2015-11-17 MED FILL — TRIFLUOPERAZINE 2 MG TABLET: 2 | 30 days supply | Qty: 30 | Fill #1

## 2015-11-17 MED FILL — FLUoxetine HCL 20 MG TABS: 20 | 30 days supply | Qty: 90 | Fill #1

## 2015-11-17 MED FILL — AMITRIPTYLINE HCL 25 MG TAB: 25 | 30 days supply | Qty: 90 | Fill #1

## 2015-11-30 ENCOUNTER — Other Ambulatory Visit (HOSPITAL_COMMUNITY): Payer: Self-pay

## 2015-11-30 DIAGNOSIS — F411 Generalized anxiety disorder: Secondary | ICD-10-CM

## 2015-11-30 MED ORDER — DIAZEPAM 5 MG PO TABS: 5.0000 mg | ORAL_TABLET | Freq: Three times a day (TID) | ORAL | Status: DC

## 2015-11-30 MED FILL — diazePAM 5 MG TABS: 5 | 30 days supply | Qty: 90 | Fill #0

## 2015-12-08 ENCOUNTER — Encounter: Payer: Self-pay | Admitting: Podiatry

## 2015-12-08 ENCOUNTER — Ambulatory Visit (HOSPITAL_BASED_OUTPATIENT_CLINIC_OR_DEPARTMENT_OTHER)
Admission: RE | Admit: 2015-12-08 | Discharge: 2015-12-08 | Disposition: A | Discharge status: Home / Self Care | Payer: 59 | Source: Ambulatory Visit | Attending: Podiatry | Admitting: Podiatry

## 2015-12-08 ENCOUNTER — Ambulatory Visit (HOSPITAL_COMMUNITY): Payer: Self-pay | Admitting: Psychiatry

## 2015-12-08 ENCOUNTER — Telehealth: Payer: Self-pay | Admitting: Podiatry

## 2015-12-08 ENCOUNTER — Ambulatory Visit (INDEPENDENT_AMBULATORY_CARE_PROVIDER_SITE_OTHER): Payer: 59 | Admitting: Podiatry

## 2015-12-08 DIAGNOSIS — M722 Plantar fascial fibromatosis: Secondary | ICD-10-CM

## 2015-12-08 DIAGNOSIS — R52 Pain, unspecified: Secondary | ICD-10-CM

## 2015-12-08 DIAGNOSIS — M19072 Primary osteoarthritis, left ankle and foot: Secondary | ICD-10-CM | POA: Diagnosis not present

## 2015-12-08 DIAGNOSIS — M19071 Primary osteoarthritis, right ankle and foot: Secondary | ICD-10-CM | POA: Diagnosis not present

## 2015-12-08 MED ORDER — METHYLPREDNISOLONE 4 MG PO TBPK: ORAL_TABLET | ORAL | 0 refills | Status: DC

## 2015-12-08 MED FILL — QUETIAPINE FUMARATE 200 MG: 200 | 30 days supply | Qty: 30 | Fill #2

## 2015-12-08 MED FILL — METHYLPREDNISOLONE 4 MG TAB: 4 | 6 days supply | Qty: 21 | Fill #0

## 2015-12-08 NOTE — Progress Notes (Signed)
   Subjective:    Patient ID: Ashley Franklin, female    DOB: Sep 29, 1970, 45 y.o.   MRN: [id number]  HPI  45 year old female presents the office with concerns of pain to both of her feet in them heels. Pain is worse in the morning she first gets up after periods of rest when she starts walking. She been ongoing about 9 months. She has tried over-the-counter inserts as well as Epson salts as well as multiple shoe changes without any relief of symptoms. She's been a more consistent pain to the heel describes as a throbbing sensation. No numbness or tingling. No swelling or redness. The pain does not wake her up at night. No other complaints at this time.  Review of Systems  All other systems reviewed and are negative.      Objective:   Physical Exam General: AAO x3, NAD  Dermatological: Skin is warm, dry and supple bilateral. Nails x 10 are well manicured; remaining integument appears unremarkable at this time. There are no open sores, no preulcerative lesions, no rash or signs of infection present.  Vascular: Dorsalis Pedis artery and Posterior Tibial artery pedal pulses are 2/4 bilateral with immedate capillary fill time. . There is no pain with calf compression, swelling, warmth, erythema.   Neruologic: Grossly intact via light touch bilateral. Vibratory intact via tuning fork bilateral. Protective threshold with Semmes Wienstein monofilament intact to all pedal sites bilateral.   Musculoskeletal: Tenderness to palpation along the plantar medial tubercle of the calcaneus at the insertion of plantar fascia on the left and right foot. There is no pain along the course of the plantar fascia within the arch of the foot. Plantar fascia appears to be intact. There is no pain with lateral compression of the calcaneus or pain with vibratory sensation. There is no pain along the course or insertion of the achilles tendon. No other areas of tenderness to bilateral lower extremities. MMT 5/5, ROM WNL.     Gait: Unassisted, Nonantalgic.      Assessment & Plan:  62 female with bilateral heel pain, likely plantar fasciitis -Treatment options discussed including all alternatives, risks, and complications -Etiology of symptoms were discussed -X-rays were reviewed -Patient elects to proceed with steroid injection into the left and right heel. Under sterile skin preparation, a total of 2.5cc of kenalog 10, 0.5% Marcaine plain, and 2% lidocaine plain were infiltrated into the symptomatic area without complication. A band-aid was applied. Patient tolerated the injection well without complication. Post-injection care with discussed with the patient. Discussed with the patient to ice the area over the next couple of days to help prevent a steroid flare.  -Plantar fascial braces dispensed -Prescribed Medrol Dosepak -Cannot take anti-inflammatories getting gastric bypass -Recommend custom orthotics. She'll be casted this Friday. -Stretching and icing exercises daily -Discussed supportive shoe gear. -Follow-up in 4 weeks or sooner if any issues are to arise.  Ovid Curd, DPM

## 2015-12-08 NOTE — Patient Instructions (Signed)

## 2015-12-10 ENCOUNTER — Ambulatory Visit (INDEPENDENT_AMBULATORY_CARE_PROVIDER_SITE_OTHER): Payer: 59 | Admitting: Podiatry

## 2015-12-10 ENCOUNTER — Encounter: Payer: Self-pay | Admitting: Podiatry

## 2015-12-10 DIAGNOSIS — M722 Plantar fascial fibromatosis: Secondary | ICD-10-CM

## 2015-12-11 NOTE — Progress Notes (Signed)
Patient presents the cast orthotics. She was scanned and sent to [facility] labs. Follow-up with as scheduled. She declined appointment, so. She was seen by the medical assistant.

## 2015-12-17 MED FILL — OXcarbazepine 300 MG TABS: 300 | 30 days supply | Qty: 90 | Fill #1

## 2015-12-20 ENCOUNTER — Other Ambulatory Visit (HOSPITAL_COMMUNITY): Payer: Self-pay

## 2015-12-20 DIAGNOSIS — F3131 Bipolar disorder, current episode depressed, mild: Secondary | ICD-10-CM

## 2015-12-20 MED ORDER — ZOLPIDEM TARTRATE 10 MG PO TABS: 10.0000 mg | ORAL_TABLET | Freq: Every day | ORAL | 0 refills | Status: DC

## 2015-12-20 MED FILL — valACYclovir HCL 1 GM TABS: 1 | 30 days supply | Qty: 30 | Fill #6

## 2015-12-20 MED FILL — FLUoxetine HCL 20 MG TABS: 20 | 30 days supply | Qty: 90 | Fill #1

## 2015-12-20 MED FILL — ZOLPIDEM TARTRATE 10 MG TAB: 10 | 30 days supply | Qty: 30 | Fill #0

## 2015-12-20 MED FILL — AMITRIPTYLINE HCL 25 MG TAB: 25 | 30 days supply | Qty: 90 | Fill #1

## 2015-12-27 MED FILL — PENICILLIN VK 500 MG TABLET: 500 | 5 days supply | Qty: 20 | Fill #0

## 2016-01-05 ENCOUNTER — Ambulatory Visit (INDEPENDENT_AMBULATORY_CARE_PROVIDER_SITE_OTHER): Payer: 59 | Admitting: Physician Assistant

## 2016-01-05 ENCOUNTER — Encounter: Payer: Self-pay | Admitting: Physician Assistant

## 2016-01-05 ENCOUNTER — Ambulatory Visit (INDEPENDENT_AMBULATORY_CARE_PROVIDER_SITE_OTHER): Payer: 59 | Admitting: Podiatry

## 2016-01-05 ENCOUNTER — Other Ambulatory Visit (HOSPITAL_COMMUNITY): Payer: Self-pay

## 2016-01-05 ENCOUNTER — Encounter: Payer: Self-pay | Admitting: Podiatry

## 2016-01-05 VITALS — BP 110/70 | HR 83 | Temp 98.2°F | Resp 16 | Ht 64.0 in | Wt 244.2 lb

## 2016-01-05 DIAGNOSIS — M722 Plantar fascial fibromatosis: Secondary | ICD-10-CM

## 2016-01-05 DIAGNOSIS — B9689 Other specified bacterial agents as the cause of diseases classified elsewhere: Secondary | ICD-10-CM

## 2016-01-05 DIAGNOSIS — R229 Localized swelling, mass and lump, unspecified: Secondary | ICD-10-CM

## 2016-01-05 DIAGNOSIS — J019 Acute sinusitis, unspecified: Secondary | ICD-10-CM | POA: Diagnosis not present

## 2016-01-05 DIAGNOSIS — F411 Generalized anxiety disorder: Secondary | ICD-10-CM

## 2016-01-05 MED ORDER — DIAZEPAM 5 MG PO TABS: 5.0000 mg | ORAL_TABLET | Freq: Three times a day (TID) | ORAL | 0 refills | Status: DC

## 2016-01-05 MED ORDER — ALBUTEROL SULFATE HFA 108 (90 BASE) MCG/ACT IN AERS: 2.0000 | INHALATION_SPRAY | Freq: Four times a day (QID) | RESPIRATORY_TRACT | 0 refills | Status: DC | PRN

## 2016-01-05 MED ORDER — BENZONATATE 100 MG PO CAPS: 100.0000 mg | ORAL_CAPSULE | Freq: Three times a day (TID) | ORAL | 0 refills | Status: DC | PRN

## 2016-01-05 MED ORDER — DOXYCYCLINE HYCLATE 100 MG PO CAPS: 100.0000 mg | ORAL_CAPSULE | Freq: Two times a day (BID) | ORAL | 0 refills | Status: DC

## 2016-01-05 MED FILL — TRIFLUOPERAZINE 2 MG TABLET: 2 | 30 days supply | Qty: 30 | Fill #1

## 2016-01-05 MED FILL — DOXYCYCLINE HYCLATE 100 MG: 100 | 10 days supply | Qty: 20 | Fill #0

## 2016-01-05 MED FILL — VENTOLIN HFA 90 MCG INHALER: 108 (90 BAS | 30 days supply | Qty: 18 | Fill #0

## 2016-01-05 MED FILL — diazePAM 5 MG TABS: 5 | 30 days supply | Qty: 90 | Fill #0

## 2016-01-05 MED FILL — BENZONATATE 100 MG CAPSULE: 100 | 30 days supply | Qty: 30 | Fill #0

## 2016-01-05 NOTE — Progress Notes (Signed)
Patient presents to clinic today c/o one and a half weeks of sinus pressure, sinus pain, headache, ear pressure, chest congestion and cough productive of yellow sputum. Denies fevers, chills, chest pain. Does note some mild chest tightness and wheezing at night. Denies history of asthma or COPD. Is a nonsmoker. Denies recent travel or sick contact.  Patient also endorses a skin mass of left upper chest, that has been present for 3 months and is nonhealing. Denies history of blistering sunburn or skin cancer. Endorses the lesion is nonpainful, nonpruritic. Denies drainage from lesion. Denies similar lesion noted elsewhere.  Past Medical History:  Diagnosis Date  . Anemia   . Anxiety   . Bipolar affective disorder (HCC)   . Blood transfusion without reported diagnosis   . Depression   . Kidney stone   . SBO (small bowel obstruction) (HCC) 01/2014    Current Outpatient Prescriptions on File Prior to Visit  Medication Sig Dispense Refill  . amitriptyline (ELAVIL) 25 MG tablet Take 1 tablet (25 mg total) by mouth 3 (three) times daily. 90 tablet 1  . FLUoxetine (PROZAC) 20 MG tablet Take 3 tablets (60 mg total) by mouth daily. 90 tablet 1  . nicotine (NICODERM CQ - DOSED IN MG/24 HOURS) 14 mg/24hr patch Place 1 patch (14 mg total) onto the skin daily. 28 patch 0  . Oxcarbazepine (TRILEPTAL) 300 MG tablet Take 1 tablet (300 mg total) by mouth 3 (three) times daily. 90 tablet 1  . QUEtiapine (SEROQUEL) 200 MG tablet Take 1 tablet (200 mg total) by mouth at bedtime. 30 tablet 1  . trifluoperazine (STELAZINE) 2 MG tablet Take 1 tablet (2 mg total) by mouth at bedtime. 30 tablet 1  . valACYclovir (VALTREX) 1000 MG tablet Take 1,000 mg by mouth daily.    Marland Kitchen. zolpidem (AMBIEN) 10 MG tablet Take 1 tablet (10 mg total) by mouth at bedtime. 30 tablet 0   No current facility-administered medications on file prior to visit.     Allergies  Allergen Reactions  . Nsaids Other (See Comments)   Contraindicated with gastric bypass    Family History  Problem Relation Age of Onset  . Diabetes Mother   . Cancer Mother 60    Throat   . Bipolar disorder Mother   . Bipolar disorder Sister   . Bipolar disorder Brother   . Drug abuse Brother   . Bipolar disorder Sister     Social History   Social History  . Marital status: Single    Spouse name: N/A  . Number of children: N/A  . Years of education: N/A   Social History Main Topics  . Smoking status: Current Every Day Smoker    Packs/day: 0.50    Last attempt to quit: 05/11/2014  . Smokeless tobacco: Never Used  . Alcohol use No  . Drug use: No  . Sexual activity: Yes    Birth control/ protection: Surgical   Other Topics Concern  . None   Social History Narrative  . None    Review of Systems - See HPI.  All other ROS are negative.  BP 110/70 (Patient Position: Sitting, Cuff Size: Large)   Pulse 83   Temp 98.2 F (36.8 C) (Oral)   Resp 16   Ht 5\' 4"  (1.626 m)   Wt 244 lb 4 oz (110.8 kg)   SpO2 98%   BMI 41.93 kg/m   Physical Exam  Constitutional: She is oriented to person, place, and time and  well-developed, well-nourished, and in no distress.  HENT:  Head: Normocephalic and atraumatic.  Right Ear: Tympanic membrane normal.  Left Ear: Tympanic membrane normal.  Nose: Mucosal edema and rhinorrhea present. Right sinus exhibits maxillary sinus tenderness. Left sinus exhibits maxillary sinus tenderness.  Mouth/Throat: Uvula is midline, oropharynx is clear and moist and mucous membranes are normal. Dental caries present.  Neck: Neck supple.  Cardiovascular: Normal rate, regular rhythm, normal heart sounds and intact distal pulses.   Pulmonary/Chest: Effort normal and breath sounds normal. No respiratory distress. She has no wheezes. She has no rales. She exhibits no tenderness.  Lymphadenopathy:    She has no cervical adenopathy.  Neurological: She is alert and oriented to person, place, and time.  Skin:  Skin is warm and dry.     Psychiatric: Affect normal.  Vitals reviewed.  Assessment/Plan: 1. Acute bacterial sinusitis Will start ABX with Doxycycline. Tessalon and Albuterol per orders. Supportive measures reviewed. FU if not resolving.  - doxycycline (VIBRAMYCIN) 100 MG capsule; Take 1 capsule (100 mg total) by mouth 2 (two) times daily.  Dispense: 20 capsule; Refill: 0 - albuterol (PROVENTIL HFA;VENTOLIN HFA) 108 (90 Base) MCG/ACT inhaler; Inhale 2 puffs into the lungs every 6 (six) hours as needed for wheezing or shortness of breath.  Dispense: 1 Inhaler; Refill: 0 - benzonatate (TESSALON) 100 MG capsule; Take 1 capsule (100 mg total) by mouth 3 (three) times daily as needed.  Dispense: 30 capsule; Refill: 0  2. 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 Referral placed to dermatology for excisional biopsy management. - Ambulatory referral to Dermatology   Piedad ClimesMartin, Kendelle Schweers Cody, PA-C

## 2016-01-05 NOTE — Patient Instructions (Addendum)
Please take antibiotic as directed.  Increase fluid intake.  Use Saline nasal spray.  Take a daily multivitamin. Use Tessalon as directed for cough and Albuterol inhaler for wheeZing.  Place a humidifier in the bedroom.  Please call or return clinic if symptoms are not improving.  You will be contacted for assessment by Dermatology.  Sinusitis Sinusitis is redness, soreness, and swelling (inflammation) of the paranasal sinuses. Paranasal sinuses are air pockets within the bones of your face (beneath the eyes, the middle of the forehead, or above the eyes). In healthy paranasal sinuses, mucus is able to drain out, and air is able to circulate through them by way of your nose. However, when your paranasal sinuses are inflamed, mucus and air can become trapped. This can allow bacteria and other germs to grow and cause infection. Sinusitis can develop quickly and last only a short time (acute) or continue over a long period (chronic). Sinusitis that lasts for more than 12 weeks is considered chronic.  CAUSES  Causes of sinusitis include:  Allergies.  Structural abnormalities, such as displacement of the cartilage that separates your nostrils (deviated septum), which can decrease the air flow through your nose and sinuses and affect sinus drainage.  Functional abnormalities, such as when the small hairs (cilia) that line your sinuses and help remove mucus do not work properly or are not present. SYMPTOMS  Symptoms of acute and chronic sinusitis are the same. The primary symptoms are pain and pressure around the affected sinuses. Other symptoms include:  Upper toothache.  Earache.  Headache.  Bad breath.  Decreased sense of smell and taste.  A cough, which worsens when you are lying flat.  Fatigue.  Fever.  Thick drainage from your nose, which often is green and may contain pus (purulent).  Swelling and warmth over the affected sinuses. DIAGNOSIS  Your caregiver will perform a  physical exam. During the exam, your caregiver may:  Look in your nose for signs of abnormal growths in your nostrils (nasal polyps).  Tap over the affected sinus to check for signs of infection.  View the inside of your sinuses (endoscopy) with a special imaging device with a light attached (endoscope), which is inserted into your sinuses. If your caregiver suspects that you have chronic sinusitis, one or more of the following tests may be recommended:  Allergy tests.  Nasal culture A sample of mucus is taken from your nose and sent to a lab and screened for bacteria.  Nasal cytology A sample of mucus is taken from your nose and examined by your caregiver to determine if your sinusitis is related to an allergy. TREATMENT  Most cases of acute sinusitis are related to a viral infection and will resolve on their own within 10 days. Sometimes medicines are prescribed to help relieve symptoms (pain medicine, decongestants, nasal steroid sprays, or saline sprays).  However, for sinusitis related to a bacterial infection, your caregiver will prescribe antibiotic medicines. These are medicines that will help kill the bacteria causing the infection.  Rarely, sinusitis is caused by a fungal infection. In theses cases, your caregiver will prescribe antifungal medicine. For some cases of chronic sinusitis, surgery is needed. Generally, these are cases in which sinusitis recurs more than 3 times per year, despite other treatments. HOME CARE INSTRUCTIONS   Drink plenty of water. Water helps thin the mucus so your sinuses can drain more easily.  Use a humidifier.  Inhale steam 3 to 4 times a day (for example, sit in the  bathroom with the shower running).  Apply a warm, moist washcloth to your face 3 to 4 times a day, or as directed by your caregiver.  Use saline nasal sprays to help moisten and clean your sinuses.  Take over-the-counter or prescription medicines for pain, discomfort, or fever only  as directed by your caregiver. SEEK IMMEDIATE MEDICAL CARE IF:  You have increasing pain or severe headaches.  You have nausea, vomiting, or drowsiness.  You have swelling around your face.  You have vision problems.  You have a stiff neck.  You have difficulty breathing. MAKE SURE YOU:   Understand these instructions.  Will watch your condition.  Will get help right away if you are not doing well or get worse. Document Released: 05/01/2005 Document Revised: 07/24/2011 Document Reviewed: 05/16/2011 [facility] Patient Information 2014 [place], [place].   Metered Dose Inhaler (No Spacer Used) Inhaled medicines are the basis of treatment for asthma and other breathing problems. Inhaled medicine can only be effective if used properly. Good technique assures that the medicine reaches the lungs. Metered dose inhalers (MDIs) are used to deliver a variety of inhaled medicines. These include quick relief or rescue medicines (such as bronchodilators) and controller medicines (such as corticosteroids). The medicine is delivered by pushing down on a metal canister to release a set amount of spray. If you are using different kinds of inhalers, use your quick relief medicine to open the airways 10-15 minutes before using a steroid, if instructed to do so by your health care provider. If you are unsure which inhalers to use and the order of using them, ask your health care provider, nurse, or respiratory therapist. HOW TO USE THE INHALER 1. Remove the cap from the inhaler. 2. If you are using the inhaler for the first time, you will need to prime it. Shake the inhaler for 5 seconds and release four puffs into the air, away from your face. Ask your health care provider or pharmacist if you have questions about priming your inhaler. 3. Shake the inhaler for 5 seconds before each breath in (inhalation). 4. Position the inhaler so that the top of the canister faces up. 5. Put your index finger on the top  of the medicine canister. Your thumb supports the bottom of the inhaler. 6. Open your mouth. 7. Either place the inhaler between your teeth and place your lips tightly around the mouthpiece, or hold the inhaler 1-2 inches away from your open mouth. If you are unsure of which technique to use, ask your health care provider. 8. Breathe out (exhale) normally and as completely as possible. 9. Press the canister down with the index finger to release the medicine. 10. At the same time as the canister is pressed, inhale deeply and slowly until your lungs are completely filled. This should take 4-6 seconds. Keep your tongue down. 11. Hold the medicine in your lungs for 5-10 seconds (10 seconds is best). This helps the medicine get into the small airways of your lungs. 12. Breathe out slowly, through pursed lips. Whistling is an example of pursed lips. 13. Wait at least 1 minute between puffs. Continue with the above steps until you have taken the number of puffs your health care provider has ordered. Do not use the inhaler more than your health care provider directs you to. 14. Replace the cap on the inhaler. 15. Follow the directions from your health care provider or the inhaler insert for cleaning the inhaler. If you are using a steroid inhaler,  after your last puff, rinse your mouth with water, gargle, and spit out the water. Do not swallow the water. AVOID:  Inhaling before or after starting the spray of medicine. It takes practice to coordinate your breathing with triggering the spray.  Inhaling through the nose (rather than the mouth) when triggering the spray. HOW TO DETERMINE IF YOUR INHALER IS FULL OR NEARLY EMPTY You cannot know when an inhaler is empty by shaking it. Some inhalers are now being made with dose counters. Ask your health care provider for a prescription that has a dose counter if you feel you need that extra help. If your inhaler does not have a counter, ask your health care  provider to help you determine the date you need to refill your inhaler. Write the refill date on a calendar or your inhaler canister. Refill your inhaler 7-10 days before it runs out. Be sure to keep an adequate supply of medicine. This includes making sure it has not expired, and making sure you have a spare inhaler. SEEK MEDICAL CARE IF:  Symptoms are only partially relieved with your inhaler.  You are having trouble using your inhaler.  You experience an increase in phlegm. SEEK IMMEDIATE MEDICAL CARE IF:  You feel little or no relief with your inhalers. You are still wheezing and feeling shortness of breath, tightness in your chest, or both.  You have dizziness, headaches, or a fast heart rate.  You have chills, fever, or night sweats.  There is a noticeable increase in phlegm production, or there is blood in the phlegm. MAKE SURE YOU:  Understand these instructions.  Will watch your condition.  Will get help right away if you are not doing well or get worse.   This information is not intended to replace advice given to you by your health care provider. Make sure you discuss any questions you have with your health care provider.   Document Released: 02/26/2007 Document Revised: 05/22/2014 Document Reviewed: 10/17/2012 Elsevier Interactive Patient Education [employer]2016 [employer].

## 2016-01-06 NOTE — Progress Notes (Signed)
Subjective: Ashley OatsJennifer S Franklin presents to the office today for follow-up evaluation of bilateral  heel pain. They state that they are doing better but still having some pain. They have been icing, stretching, try to wear supportive shoe as much as possible.She has purchased new shoes as well. She is also using the plantar fascial braces. She is awaiting orthotics. No other complaints at this time. No acute changes since last appointment. They deny any systemic complaints such as fevers, chills, nausea, vomiting.  Objective: General: AAO x3, NAD  Dermatological: Skin is warm, dry and supple bilateral. Nails x 10 are well manicured; remaining integument appears unremarkable at this time. There are no open sores, no preulcerative lesions, no rash or signs of infection present.  Vascular: Dorsalis Pedis artery and Posterior Tibial artery pedal pulses are 2/4 bilateral with immedate capillary fill time. Pedal hair growth present. There is no pain with calf compression, swelling, warmth, erythema.   Neruologic: Grossly intact via light touch bilateral. Vibratory intact via tuning fork bilateral. Protective threshold with Semmes Wienstein monofilament intact to all pedal sites bilateral.   Musculoskeletal: There is improved but continued tenderness palpation along the plantar medial tubercle of the calcaneus at the insertion of the plantar fascia on the  Left and right foot. There is no pain along the course of the plantar fascia within the arch of the foot. Plantar fascia appears to be intact bilaterally. There is no pain with lateral compression of the calcaneus and there is no pain with vibratory sensation. There is no pain along the course or insertion of the Achilles tendon. There are no other areas of tenderness to bilateral lower extremities. No gross boney pedal deformities bilateral. No pain, crepitus, or limitation noted with foot and ankle range of motion bilateral. Muscular strength 5/5 in all groups  tested bilateral.  Gait: Unassisted, Nonantalgic.   Assessment: Presents for follow-up evaluation for heel pain, likely plantar fasciitis   Plan: -Treatment options discussed including all alternatives, risks, and complications -Patient elects to proceed with steroid injection into the bilateral heel. Under sterile skin preparation, a total of 2.5cc of kenalog 10, 0.5% Marcaine plain, and 2% lidocaine plain were infiltrated into the symptomatic area without complication. A band-aid was applied. Patient tolerated the injection well without complication. Post-injection care with discussed with the patient. Discussed with the patient to ice the area over the next couple of days to help prevent a steroid flare.  -Awaiting orthotics. -Ice and stretching exercises on a daily basis. -Continue supportive shoe gear. -Follow-up in  4 weeks or sooner if any problems arise. In the meantime, encouraged to call the office with any questions, concerns, change in symptoms.   Ovid CurdMatthew Wagoner, DPM

## 2016-01-10 ENCOUNTER — Other Ambulatory Visit (HOSPITAL_COMMUNITY): Payer: Self-pay

## 2016-01-10 DIAGNOSIS — F3131 Bipolar disorder, current episode depressed, mild: Secondary | ICD-10-CM

## 2016-01-10 MED ORDER — QUETIAPINE FUMARATE 200 MG PO TABS: 200.0000 mg | ORAL_TABLET | Freq: Every day | ORAL | 1 refills | Status: DC

## 2016-01-10 MED FILL — QUETIAPINE FUMARATE 200 MG: 200 | 30 days supply | Qty: 30 | Fill #0

## 2016-01-13 ENCOUNTER — Telehealth: Payer: Self-pay | Admitting: *Deleted

## 2016-01-13 NOTE — Telephone Encounter (Signed)
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 Misty StanleyLisa

## 2016-01-18 ENCOUNTER — Other Ambulatory Visit (HOSPITAL_COMMUNITY): Payer: Self-pay

## 2016-01-18 DIAGNOSIS — F3131 Bipolar disorder, current episode depressed, mild: Secondary | ICD-10-CM

## 2016-01-18 MED ORDER — ZOLPIDEM TARTRATE 10 MG PO TABS: 10.0000 mg | ORAL_TABLET | Freq: Every day | ORAL | 0 refills | Status: DC

## 2016-01-18 MED FILL — ZOLPIDEM TARTRATE 10 MG TAB: 10 | 30 days supply | Qty: 30 | Fill #0

## 2016-01-19 ENCOUNTER — Other Ambulatory Visit (HOSPITAL_COMMUNITY): Payer: Self-pay | Admitting: Psychiatry

## 2016-01-19 ENCOUNTER — Ambulatory Visit (HOSPITAL_COMMUNITY): Payer: Self-pay | Admitting: Psychiatry

## 2016-01-19 DIAGNOSIS — F411 Generalized anxiety disorder: Secondary | ICD-10-CM

## 2016-01-19 DIAGNOSIS — F3131 Bipolar disorder, current episode depressed, mild: Secondary | ICD-10-CM

## 2016-01-19 MED FILL — OXcarbazepine 300 MG TABS: 300 | 30 days supply | Qty: 90 | Fill #2

## 2016-01-24 ENCOUNTER — Other Ambulatory Visit (HOSPITAL_COMMUNITY): Payer: Self-pay

## 2016-01-24 DIAGNOSIS — F3131 Bipolar disorder, current episode depressed, mild: Secondary | ICD-10-CM

## 2016-01-24 DIAGNOSIS — F411 Generalized anxiety disorder: Secondary | ICD-10-CM

## 2016-01-24 MED ORDER — AMITRIPTYLINE HCL 25 MG PO TABS: 25.0000 mg | ORAL_TABLET | Freq: Three times a day (TID) | ORAL | 0 refills | Status: DC

## 2016-01-24 MED ORDER — OXCARBAZEPINE 300 MG PO TABS: 300.0000 mg | ORAL_TABLET | Freq: Three times a day (TID) | ORAL | 0 refills | Status: DC

## 2016-01-24 MED ORDER — TRIFLUOPERAZINE HCL 2 MG PO TABS: 2.0000 mg | ORAL_TABLET | Freq: Every day | ORAL | 0 refills | Status: DC

## 2016-01-24 MED ORDER — FLUOXETINE HCL 20 MG PO TABS: 60.0000 mg | ORAL_TABLET | Freq: Every day | ORAL | 0 refills | Status: DC

## 2016-01-24 MED FILL — AMITRIPTYLINE HCL 25 MG TAB: 25 | 30 days supply | Qty: 90 | Fill #0

## 2016-01-24 MED FILL — FLUoxetine HCL 20 MG TABS: 20 | 30 days supply | Qty: 90 | Fill #0

## 2016-01-24 NOTE — Progress Notes (Signed)
Patient is calling for a refill, she has a follow up with dr. Earl ManyHisadda later this month. Refill was appropriate and per protocol a one month supply was sent to the pharmacy.

## 2016-01-25 MED FILL — TRIFLUOPERAZINE 2 MG TABLET: 2 | 30 days supply | Qty: 30 | Fill #0

## 2016-02-02 ENCOUNTER — Encounter (HOSPITAL_COMMUNITY): Payer: Self-pay | Admitting: Psychiatry

## 2016-02-02 ENCOUNTER — Ambulatory Visit (INDEPENDENT_AMBULATORY_CARE_PROVIDER_SITE_OTHER): Payer: 59 | Admitting: Psychiatry

## 2016-02-02 DIAGNOSIS — R45851 Suicidal ideations: Secondary | ICD-10-CM

## 2016-02-02 DIAGNOSIS — F411 Generalized anxiety disorder: Secondary | ICD-10-CM | POA: Diagnosis not present

## 2016-02-02 DIAGNOSIS — F3131 Bipolar disorder, current episode depressed, mild: Secondary | ICD-10-CM

## 2016-02-02 MED ORDER — OXCARBAZEPINE 300 MG PO TABS: 300.0000 mg | ORAL_TABLET | Freq: Three times a day (TID) | ORAL | 0 refills | Status: DC

## 2016-02-02 MED ORDER — ZOLPIDEM TARTRATE 10 MG PO TABS: 10.0000 mg | ORAL_TABLET | Freq: Every day | ORAL | 0 refills | Status: DC

## 2016-02-02 MED ORDER — DIAZEPAM 5 MG PO TABS: 5.0000 mg | ORAL_TABLET | Freq: Three times a day (TID) | ORAL | 0 refills | Status: DC

## 2016-02-02 MED ORDER — QUETIAPINE FUMARATE 200 MG PO TABS: 200.0000 mg | ORAL_TABLET | Freq: Every day | ORAL | 2 refills | Status: DC

## 2016-02-02 MED ORDER — FLUOXETINE HCL 20 MG PO TABS: 60.0000 mg | ORAL_TABLET | Freq: Every day | ORAL | 1 refills | Status: DC

## 2016-02-02 MED ORDER — QUETIAPINE FUMARATE 25 MG PO TABS: 25.0000 mg | ORAL_TABLET | Freq: Two times a day (BID) | ORAL | 2 refills | Status: DC

## 2016-02-02 MED ORDER — AMITRIPTYLINE HCL 25 MG PO TABS
25.0000 mg | ORAL_TABLET | Freq: Three times a day (TID) | ORAL | 1 refills | Status: DC
Start: 2016-02-02 — End: 2016-03-08

## 2016-02-02 MED FILL — QUETIAPINE FUMARATE 200 MG: 200 | 30 days supply | Qty: 30 | Fill #0

## 2016-02-02 MED FILL — QUETIAPINE FUMARATE 25 MG T: 25 | 30 days supply | Qty: 60 | Fill #0
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
## 2016-02-02 NOTE — Patient Instructions (Addendum)
-   Decrease Stelazine 1mg  at night for one week then off - Start Quetiapine 25 mg twice a day and 200 mg at night - Continue Fluoxetine 60 mg daily - Continue Amitryptyline 25 mg three times a day - Continue Trileptal 300 mg TID - Continue Valium 5 mg TID - Continue Ambien 10 mg qhs - Return to clinic in a month

## 2016-02-09 MED FILL — diazePAM 5 MG TABS: 5 | 30 days supply | Qty: 90 | Fill #0

## 2016-02-11 ENCOUNTER — Emergency Department (HOSPITAL_BASED_OUTPATIENT_CLINIC_OR_DEPARTMENT_OTHER)
Admission: EM | Admit: 2016-02-11 | Discharge: 2016-02-11 | Disposition: A | Discharge status: Home / Self Care | Payer: 59 | Attending: Emergency Medicine | Admitting: Emergency Medicine

## 2016-02-11 ENCOUNTER — Encounter (HOSPITAL_BASED_OUTPATIENT_CLINIC_OR_DEPARTMENT_OTHER): Payer: Self-pay | Admitting: *Deleted

## 2016-02-11 DIAGNOSIS — L559 Sunburn, unspecified: Secondary | ICD-10-CM

## 2016-02-11 DIAGNOSIS — F17200 Nicotine dependence, unspecified, uncomplicated: Secondary | ICD-10-CM | POA: Diagnosis not present

## 2016-02-11 DIAGNOSIS — R451 Restlessness and agitation: Secondary | ICD-10-CM | POA: Insufficient documentation

## 2016-02-11 DIAGNOSIS — L299 Pruritus, unspecified: Secondary | ICD-10-CM

## 2016-02-11 DIAGNOSIS — L550 Sunburn of first degree: Secondary | ICD-10-CM | POA: Diagnosis not present

## 2016-02-11 DIAGNOSIS — Z79899 Other long term (current) drug therapy: Secondary | ICD-10-CM | POA: Insufficient documentation

## 2016-02-11 MED ORDER — DEXAMETHASONE SODIUM PHOSPHATE 10 MG/ML IJ SOLN
10.0000 mg | Freq: Once | INTRAMUSCULAR | Status: AC
  Administered 2016-02-11: 10 mg via INTRAMUSCULAR
  Filled 2016-02-11: qty 1

## 2016-02-11 MED ORDER — HYDROXYZINE HCL 25 MG PO TABS: 25.0000 mg | ORAL_TABLET | Freq: Four times a day (QID) | ORAL | 0 refills | Status: DC

## 2016-02-11 MED ORDER — HALOPERIDOL LACTATE 5 MG/ML IJ SOLN
5.0000 mg | Freq: Once | INTRAMUSCULAR | Status: AC
  Administered 2016-02-11: 5 mg via INTRAMUSCULAR
  Filled 2016-02-11: qty 1
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
## 2016-02-11 NOTE — ED Notes (Signed)
Went in to give injections. Patient states that her husband is outside because "he is mad at her" Patient is itching her back with blanket and in constant restlessness related to "itching"

## 2016-02-11 NOTE — ED Triage Notes (Signed)
Pt presents with severe pain after lying in tanning bed x 12 min for the first time.

## 2016-02-15 MED FILL — ZOLPIDEM TARTRATE 10 MG TAB: 10 | 30 days supply | Qty: 30 | Fill #0

## 2016-02-16 MED FILL — OXcarbazepine 300 MG TABS: 300 | 30 days supply | Qty: 90 | Fill #0

## 2016-02-16 MED FILL — hydrOXYzine HCL 25 MG TABS: 25 | 3 days supply | Qty: 12 | Fill #0

## 2016-02-18 ENCOUNTER — Encounter: Payer: Self-pay | Admitting: Physician Assistant

## 2016-02-18 ENCOUNTER — Ambulatory Visit (INDEPENDENT_AMBULATORY_CARE_PROVIDER_SITE_OTHER): Payer: 59 | Admitting: Physician Assistant

## 2016-02-18 DIAGNOSIS — J208 Acute bronchitis due to other specified organisms: Secondary | ICD-10-CM | POA: Diagnosis not present

## 2016-02-18 DIAGNOSIS — B9689 Other specified bacterial agents as the cause of diseases classified elsewhere: Secondary | ICD-10-CM | POA: Diagnosis not present

## 2016-02-18 MED ORDER — BENZONATATE 100 MG PO CAPS: 100.0000 mg | ORAL_CAPSULE | Freq: Three times a day (TID) | ORAL | 0 refills | Status: DC | PRN

## 2016-02-18 MED ORDER — DOXYCYCLINE HYCLATE 100 MG PO CAPS: 100.0000 mg | ORAL_CAPSULE | Freq: Two times a day (BID) | ORAL | 0 refills | Status: DC

## 2016-02-18 MED FILL — DOXYCYCLINE HYCLATE 100 MG: 100 | 10 days supply | Qty: 20 | Fill #0

## 2016-02-18 MED FILL — BENZONATATE 100 MG CAPSULE: 100 | 10 days supply | Qty: 30 | Fill #0

## 2016-02-18 NOTE — Patient Instructions (Signed)
Take antibiotic (Doxycycline) as directed.  Increase fluids.  Get plenty of rest. Use Mucinex for congestion. Continue Albuterol inhaler as directed. Take a daily probiotic (I recommend Align or Culturelle, but even Activia Yogurt may be beneficial).  A humidifier placed in the bedroom may offer some relief for a dry, scratchy throat of nasal irritation.  Read information below on acute bronchitis. Please call or return to clinic if symptoms are not improving.  Acute Bronchitis Bronchitis is when the airways that extend from the windpipe into the lungs get red, puffy, and painful (inflamed). Bronchitis often causes thick spit (mucus) to develop. This leads to a cough. A cough is the most common symptom of bronchitis. In acute bronchitis, the condition usually begins suddenly and goes away over time (usually in 2 weeks). Smoking, allergies, and asthma can make bronchitis worse. Repeated episodes of bronchitis may cause more lung problems.  HOME CARE  Rest.  Drink enough fluids to keep your pee (urine) clear or pale yellow (unless you need to limit fluids as told by your doctor).  Only take over-the-counter or prescription medicines as told by your doctor.  Avoid smoking and secondhand smoke. These can make bronchitis worse. If you are a smoker, think about using nicotine gum or skin patches. Quitting smoking will help your lungs heal faster.  Reduce the chance of getting bronchitis again by:  Washing your hands often.  Avoiding people with cold symptoms.  Trying not to touch your hands to your mouth, nose, or eyes.  Follow up with your doctor as told.  GET HELP IF: Your symptoms do not improve after 1 week of treatment. Symptoms include:  Cough.  Fever.  Coughing up thick spit.  Body aches.  Chest congestion.  Chills.  Shortness of breath.  Sore throat.  GET HELP RIGHT AWAY IF:   You have an increased fever.  You have chills.  You have severe shortness of  breath.  You have bloody thick spit (sputum).  You throw up (vomit) often.  You lose too much body fluid (dehydration).  You have a severe headache.  You faint.  MAKE SURE YOU:   Understand these instructions.  Will watch your condition.  Will get help right away if you are not doing well or get worse. Document Released: 10/18/2007 Document Revised: 01/01/2013 Document Reviewed: 10/22/2012 [facility] Patient Information 2015 [employer] [place]. This information is not intended to replace advice given to you by your health care provider. Make sure you discuss any questions you have with your health care provider.

## 2016-02-18 NOTE — Progress Notes (Signed)
Pre visit review using our clinic review tool, if applicable. No additional management support is needed unless otherwise documented below in the visit note/SLS  

## 2016-02-18 NOTE — Progress Notes (Signed)
Patient presents to clinic today c/o chest congestion with cough that is sometimes productive and sometimes dry. Having a harder time getting the phlegm out. Notes PND with sore throat. Endorses chest tightness and fatigue. Denies fever, chills headache, chest pain, syncope. Denies ear pressure or pain. Symptoms have been present for a week or so and gradually worsening since onset.   Past Medical History:  Diagnosis Date  . Anemia   . Anxiety   . Bipolar affective disorder (HCC)   . Blood transfusion without reported diagnosis   . Depression   . Kidney stone   . SBO (small bowel obstruction) 01/2014    Current Outpatient Prescriptions on File Prior to Visit  Medication Sig Dispense Refill  . albuterol (PROVENTIL HFA;VENTOLIN HFA) 108 (90 Base) MCG/ACT inhaler Inhale 2 puffs into the lungs every 6 (six) hours as needed for wheezing or shortness of breath. 1 Inhaler 0  . amitriptyline (ELAVIL) 25 MG tablet Take 1 tablet (25 mg total) by mouth 3 (three) times daily. 90 tablet 1  . diazepam (VALIUM) 5 MG tablet Take 1 tablet (5 mg total) by mouth 3 (three) times daily. 90 tablet 0  . FLUoxetine (PROZAC) 20 MG tablet Take 3 tablets (60 mg total) by mouth daily. 90 tablet 1  . nicotine (NICODERM CQ - DOSED IN MG/24 HOURS) 14 mg/24hr patch Place 1 patch (14 mg total) onto the skin daily. 28 patch 0  . Oxcarbazepine (TRILEPTAL) 300 MG tablet Take 1 tablet (300 mg total) by mouth 3 (three) times daily. 90 tablet 0  . QUEtiapine (SEROQUEL) 200 MG tablet Take 1 tablet (200 mg total) by mouth at bedtime. 30 tablet 2  . QUEtiapine (SEROQUEL) 25 MG tablet Take 1 tablet (25 mg total) by mouth 2 (two) times daily. 60 tablet 2  . valACYclovir (VALTREX) 1000 MG tablet Take 1,000 mg by mouth daily.    Marland Kitchen zolpidem (AMBIEN) 10 MG tablet Take 1 tablet (10 mg total) by mouth at bedtime. 30 tablet 0  . benzonatate (TESSALON) 100 MG capsule Take 1 capsule (100 mg total) by mouth 3 (three) times daily as needed.  (Patient not taking: Reported on 02/18/2016) 30 capsule 0   No current facility-administered medications on file prior to visit.     Allergies  Allergen Reactions  . Nsaids Other (See Comments)    Contraindicated with gastric bypass    Family History  Problem Relation Age of Onset  . Diabetes Mother   . Cancer Mother 60    Throat   . Bipolar disorder Mother   . Bipolar disorder Sister   . Bipolar disorder Brother   . Drug abuse Brother   . Bipolar disorder Sister     Social History   Social History  . Marital status: Single    Spouse name: N/A  . Number of children: N/A  . Years of education: N/A   Social History Main Topics  . Smoking status: Current Every Day Smoker    Packs/day: 0.50    Last attempt to quit: 05/11/2014  . Smokeless tobacco: Never Used  . Alcohol use No  . Drug use: No  . Sexual activity: Yes    Birth control/ protection: Surgical   Other Topics Concern  . None   Social History Narrative  . None   Review of Systems - See HPI.  All other ROS are negative.  BP 127/70 (BP Location: Left Arm, Patient Position: Sitting, Cuff Size: Large)   Pulse 86  Temp 98 F (36.7 C) (Oral)   Resp 16   Ht 5\' 4"  (1.626 m)   Wt 244 lb 6 oz (110.8 kg)   SpO2 97%   BMI 41.95 kg/m   Physical Exam  Constitutional: She is well-developed, well-nourished, and in no distress.  HENT:  Head: Normocephalic and atraumatic.  Right Ear: External ear normal.  Left Ear: External ear normal.  Nose: Nose normal.  Mouth/Throat: Oropharynx is clear and moist. No oropharyngeal exudate.  TM within normal limits bilaterally.  Eyes: Conjunctivae are normal. Pupils are equal, round, and reactive to light.  Neck: Neck supple.  Cardiovascular: Normal rate, regular rhythm, normal heart sounds and intact distal pulses.   Pulmonary/Chest: Effort normal and breath sounds normal. No respiratory distress. She has no wheezes. She has no rales. She exhibits no tenderness.  Skin:  Skin is warm and dry. No rash noted.  Psychiatric: Affect normal.  Vitals reviewed.    Assessment/Plan: 1. Acute bacterial bronchitis Rx Doxycycline. Rx Tessalon. Supportive measures reviewed with patient.  - benzonatate (TESSALON) 100 MG capsule; Take 1 capsule (100 mg total) by mouth 3 (three) times daily as needed.  Dispense: 30 capsule; Refill: 0 - doxycycline (VIBRAMYCIN) 100 MG capsule; Take 1 capsule (100 mg total) by mouth 2 (two) times daily.  Dispense: 20 capsule; Refill: 0   Piedad ClimesMartin, Elvia Aydin Cody, [place]PA-C

## 2016-02-22 MED FILL — QUETIAPINE FUMARATE 200 MG: 200 | 30 days supply | Qty: 30 | Fill #1

## 2016-02-24 MED FILL — FLUoxetine HCL 20 MG TABS: 20 | 30 days supply | Qty: 90 | Fill #0

## 2016-02-24 MED FILL — AMITRIPTYLINE HCL 25 MG TAB: 25 | 30 days supply | Qty: 90 | Fill #0

## 2016-03-07 NOTE — Progress Notes (Signed)
[id number]  Ashley Franklin [id number] 45 y.o. Her 03/07/2016 10:38 AM  Chief Complaint:  "They are getting worse"  History of Present Illness:  Patient presented for follow up appointment. She states that her "up and down" has been getting worse since the last appointment. She gets loud spending so much money over animated and talkative today for a few days, and then she started sobbing, feeling helpless and not taking a shower. She was told by other people at work that she seems very overwhelmed. She occasionally has rage against patients and she has been stepping away at work. She denies any physical fight. She "feels stupid" and it has been difficult for her to complete a simple task. She states that her husband at home has been very helpful and supportive.  She sleeps 9 hours. She does not feel rested as she has had vivid dreams. She denies any history of trauma. She denies SI, HI, AH, VH. She denies alcohol use or drug use.  Suicidal Ideation: No Plan Formed: No Patient has means to carry out plan: No  Homicidal Ideation: Yes Plan Formed: No Patient has means to carry out plan: No  Past Psychiatric History/Hospitalization(s): She has been taking antidepressants since 2005 .  Patient has one psychiatric hospitalization in March 2015 when she was very depressed and could not able to take care of herself. She had tried Lexapro, Paxil, Vistaril but limited response.  She denies any history of suicidal attempt but endorsed severe depression and manic symptoms on and off.  Her symptoms started to get worst when her husband left in 2010. She admitted history of mania, sexual inappropriate behavior and involvement in a relationship with married man, severe impulsive behavior, irritability, severe mood swing, anger issues and grandiosity.  She tried Abilify and Klonopin with good response however she could not afford Abilify.  Recently we tried again but this time  it did not help her and make her more irritable.  She was diagnosed with borderline percent disorder but denies any history of cutting or self abusive behavior.  She endorse history of panic attack and anxiety attack and there are times that she is obsessed about cleaning, checking and counting.  Anxiety: Yes Bipolar Disorder: Yes Depression: Yes Mania: Yes Psychosis: No Schizophrenia: No Personality Disorder: Diagnosed borderline personality disorder Hospitalization for psychiatric illness: Yes History of Electroconvulsive Shock Therapy: No Prior Suicide Attempts: No  Medical History; Patient has history of anemia, obesity, history of gastric bypass surgery, kidney stone, blood transfusion and small bowel obstruction.  She see Dr. Marcelline MatesWilliam Martin.  She has gained more than 60 pounds in past one year.  Her TSH is normal.  Patient denies any history of seizures.  Past Medical History:  Diagnosis Date  . Anemia   . Anxiety   . Bipolar affective disorder (HCC)   . Blood transfusion without reported diagnosis   . Depression   . Kidney stone   . SBO (small bowel obstruction) 01/2014    Family History; Patient endorse her great aunt has schizophrenia.  She endorse multiple family member from her mother's side has mental disorder but they are in denial  Review of Systems  Skin: Negative for itching and rash.  Neurological: Negative for dizziness, tremors and headaches.  Psychiatric/Behavioral: Positive for depression. Negative for hallucinations, substance abuse and suicidal ideas. The patient is nervous/anxious and has insomnia.     Psychiatric: Agitation: Irritability Hallucination: No Depressed Mood: Yes Insomnia: Yes Hypersomnia: No  Altered Concentration: No Feels Worthless: Yes Grandiose Ideas: No Belief In Special Powers: No New/Increased Substance Abuse: No Compulsions: No  Neurologic: Headache: No Seizure: No Paresthesias: No   Outpatient Encounter Prescriptions  as of 03/08/2016  Medication Sig  . albuterol (PROVENTIL HFA;VENTOLIN HFA) 108 (90 Base) MCG/ACT inhaler Inhale 2 puffs into the lungs every 6 (six) hours as needed for wheezing or shortness of breath.  Marland Kitchen amitriptyline (ELAVIL) 25 MG tablet Take 1 tablet (25 mg total) by mouth 3 (three) times daily.  . benzonatate (TESSALON) 100 MG capsule Take 1 capsule (100 mg total) by mouth 3 (three) times daily as needed.  . diazepam (VALIUM) 5 MG tablet Take 1 tablet (5 mg total) by mouth 3 (three) times daily.  Marland Kitchen doxycycline (VIBRAMYCIN) 100 MG capsule Take 1 capsule (100 mg total) by mouth 2 (two) times daily.  Marland Kitchen FLUoxetine (PROZAC) 20 MG tablet Take 3 tablets (60 mg total) by mouth daily.  . nicotine (NICODERM CQ - DOSED IN MG/24 HOURS) 14 mg/24hr patch Place 1 patch (14 mg total) onto the skin daily.  . Oxcarbazepine (TRILEPTAL) 300 MG tablet Take 1 tablet (300 mg total) by mouth 3 (three) times daily.  . QUEtiapine (SEROQUEL) 200 MG tablet Take 1 tablet (200 mg total) by mouth at bedtime.  Marland Kitchen QUEtiapine (SEROQUEL) 25 MG tablet Take 1 tablet (25 mg total) by mouth 2 (two) times daily.  . valACYclovir (VALTREX) 1000 MG tablet Take 1,000 mg by mouth daily.  Marland Kitchen zolpidem (AMBIEN) 10 MG tablet Take 1 tablet (10 mg total) by mouth at bedtime.   No facility-administered encounter medications on file as of 03/08/2016.     No results found for this or any previous visit (from the past 2160 hour(s)).  Past Medical History:  Diagnosis Date  . Anemia   . Anxiety   . Bipolar affective disorder (HCC)   . Blood transfusion without reported diagnosis   . Depression   . Kidney stone   . SBO (small bowel obstruction) (HCC) 01/2014    Constitutional:  There were no vitals taken for this visit.   Musculoskeletal: Strength & Muscle Tone: within normal limits Gait & Station: normal Patient leans: N/A  Psychiatric Specialty Exam: General Appearance: Casual and Obese  Eye Contact::  Good  Speech:  Normal  Rate  Volume:  Normal  Mood:  Anxious and Depressed  Affect:  Restricted and down   Thought Process:  Coherent  Orientation:  Full (Time, Place, and Person)  Thought Content:  Logical Perceptions: denies AH/VH  Suicidal Thoughts:  No  Homicidal Thoughts:  No  Memory:  Immediate;   Fair Recent;   Fair Remote;   Fair  Judgement:  Fair  Insight:  Fair  Psychomotor Activity:  Normal  Concentration:  Fair  Recall:  [employer] of Knowledge:  Fair  Language:  Fair  Akathisia:  No  Handed:  Right  AIMS (if indicated):     Assets:  Communication Skills Desire for Improvement Financial Resources/Insurance Housing Social Support  ADL's:  Intact  Cognition:  WNL  Sleep:   poor    Established Problem, Stable/Improving (1), Review or order clinical lab tests (1), Review and summation of old records (2), Established Problem, Worsening (2), Review of Last Therapy Session (1), Review of Medication Regimen & Side Effects (2) and Review of New Medication or Change in Dosage (2)  Assessment: WING GFELLER is a 45  year old female with history of bipolar disorder with mixed  features presented here for follow up. She is a patient of Dr. Lolly Mustache. Stelazine was tapered off and quetiapine was increased at her last visit.   # Bipolar disorder, mixed.  Anxiety disorder NOS Patient endorses hypomanic and neurovegetative symptoms since the last encounter, although there has been some improvement in her rage. Will switch from Trileptal to Depakote to target her mood symptoms. Will increase quetiapine to target her anxiety, irritability. Will plan to slowly taper off Valium given patient reports no benefit from this medication. May consider discontinue Elavil in the future to avoid polypharmacy.   Plan:  1. Discontinue Trileptal 2. Start Depakote 500 mg at night for one week, then 750 mg at night 3. Decrease Valium 5 mg - 2.5 mg - 5 mg for two weeks, then 5 mg twice a day 4. Continue fluoxetine 60 mg  daily 5. Continue Elavil 25 mg three times a day 6. Increase quetiapine 50 mg twice a day and 200 mg at night 7. Continue Ambien 10 mg at night 8. Return to clinic in one month 9. Obtain blood test after two weeks for Depakote, and liver enzyme - Recommend using squeeze ball at work to relieve her tension  The patient demonstrates the following  risk factors for suicide: Chronic risk factors for suicide include psychiatric disorder /bipolar disorder, previous self-harm of scratching herself. Acute risk factors for suicide include none. Protective factors for this patient include positive social support, positive therapeutic relationship, hope for the future. Considering these factors, the overall suicide risk at this point appears to be low. Patient does have gun access at home, which she declined to lock. Discussed in detail safety plan that anytime having active suicidal thoughts or homicidal thoughts and she need to call 911 or go to local emergency room.    [name], MD 03/07/2016

## 2016-03-08 ENCOUNTER — Ambulatory Visit (INDEPENDENT_AMBULATORY_CARE_PROVIDER_SITE_OTHER): Payer: Self-pay | Admitting: Psychiatry

## 2016-03-08 ENCOUNTER — Encounter (HOSPITAL_COMMUNITY): Payer: Self-pay | Admitting: Psychiatry

## 2016-03-08 VITALS — BP 126/76 | HR 84 | Ht 64.0 in | Wt 245.2 lb

## 2016-03-08 DIAGNOSIS — Z79899 Other long term (current) drug therapy: Secondary | ICD-10-CM

## 2016-03-08 DIAGNOSIS — R45850 Homicidal ideations: Secondary | ICD-10-CM

## 2016-03-08 DIAGNOSIS — F3131 Bipolar disorder, current episode depressed, mild: Secondary | ICD-10-CM

## 2016-03-08 DIAGNOSIS — Z818 Family history of other mental and behavioral disorders: Secondary | ICD-10-CM

## 2016-03-08 DIAGNOSIS — F411 Generalized anxiety disorder: Secondary | ICD-10-CM

## 2016-03-08 MED ORDER — ZOLPIDEM TARTRATE 10 MG PO TABS: 10.0000 mg | ORAL_TABLET | Freq: Every day | ORAL | 0 refills | Status: DC

## 2016-03-08 MED ORDER — DIVALPROEX SODIUM ER 500 MG PO TB24: 500.0000 mg | ORAL_TABLET | Freq: Every day | ORAL | 0 refills | Status: DC

## 2016-03-08 MED ORDER — QUETIAPINE FUMARATE 50 MG PO TABS: 50.0000 mg | ORAL_TABLET | Freq: Two times a day (BID) | ORAL | 0 refills | Status: DC

## 2016-03-08 MED ORDER — DIAZEPAM 5 MG PO TABS: ORAL_TABLET | ORAL | 0 refills | Status: DC

## 2016-03-08 MED ORDER — QUETIAPINE FUMARATE 200 MG PO TABS: 200.0000 mg | ORAL_TABLET | Freq: Every day | ORAL | 0 refills | Status: DC

## 2016-03-08 MED ORDER — DIVALPROEX SODIUM ER 500 MG PO TB24: ORAL_TABLET | ORAL | 0 refills | Status: DC

## 2016-03-08 MED ORDER — AMITRIPTYLINE HCL 25 MG PO TABS
25.0000 mg | ORAL_TABLET | Freq: Three times a day (TID) | ORAL | 0 refills | Status: DC
Start: 2016-03-08 — End: 2016-04-19

## 2016-03-08 MED ORDER — FLUOXETINE HCL 20 MG PO TABS: 60.0000 mg | ORAL_TABLET | Freq: Every day | ORAL | 0 refills | Status: DC

## 2016-03-08 MED FILL — DIVALPROEX SOD ER 500 MG TA: 500 | 30 days supply | Qty: 42 | Fill #0

## 2016-03-08 MED FILL — QUETIAPINE FUMARATE 50 MG T: 50 | 30 days supply | Qty: 60 | Fill #0

## 2016-03-08 NOTE — Patient Instructions (Addendum)
1. Discontinue Trileptal 2. Start Depakote 500 mg at night for one week, then 750 mg at night 3. Decrease Valium 5 mg - 2.5 mg - 5 mg for two weeks, then 5 mg twice a day 4. Continue fluoxetine 60 mg daily 5. Continue Elavil 25 mg three times a day 6. Increase quetiapine 50 mg twice a day and 200 mg at night 7. Continue Ambien 10 mg at night 8. Return to clinic in one month 9. Obtain blood test after two weeks for depakote, and liver enzyme

## 2016-03-13 MED FILL — ZOLPIDEM TARTRATE 10 MG TAB: 10 | 30 days supply | Qty: 30 | Fill #0

## 2016-03-16 MED FILL — diazePAM 5 MG TABS: 5 | 45 days supply | Qty: 90 | Fill #0

## 2016-03-29 MED FILL — AMITRIPTYLINE HCL 25 MG TAB: 25 | 30 days supply | Qty: 90 | Fill #1

## 2016-03-29 MED FILL — FLUoxetine HCL 20 MG TABS: 20 | 30 days supply | Qty: 90 | Fill #1

## 2016-04-04 MED FILL — QUETIAPINE FUMARATE 200 MG: 200 | 30 days supply | Qty: 30 | Fill #1

## 2016-04-11 NOTE — Progress Notes (Deleted)
[id number] Progress [facility]  Ashley OatsJennifer S Franklin [id number] 45 y.o. Her 04/11/2016 1:13 PM  Chief Complaint:  "They are getting worse"  History of Present Illness:  Patient presented for follow up appointment. She states that her "up and down" has been getting worse since the last appointment. She gets loud spending so much money over animated and talkative today for a few days, and then she started sobbing, feeling helpless and not taking a shower. She was told by other people at work that she seems very overwhelmed. She occasionally has rage against patients and she has been stepping away at work. She denies any physical fight. She "feels stupid" and it has been difficult for her to complete a simple task. She states that her husband at home has been very helpful and supportive.  She sleeps 9 hours. She does not feel rested as she has had vivid dreams. She denies any history of trauma. She denies SI, HI, AH, VH. She denies alcohol use or drug use.  Suicidal Ideation: No Plan Formed: No Patient has means to carry out plan: No  Homicidal Ideation: Yes Plan Formed: No Patient has means to carry out plan: No  Past Psychiatric History/Hospitalization(s): She has been taking antidepressants since 2005 .  Patient has one psychiatric hospitalization in March 2015 when she was very depressed and could not able to take care of herself. She had tried Lexapro, Paxil, Vistaril but limited response.  She denies any history of suicidal attempt but endorsed severe depression and manic symptoms on and off.  Her symptoms started to get worst when her husband left in 2010. She admitted history of mania, sexual inappropriate behavior and involvement in a relationship with married man, severe impulsive behavior, irritability, severe mood swing, anger issues and grandiosity.  She tried Abilify and Klonopin with good response however she could not afford Abilify.  Recently we tried again but this time  it did not help her and make her more irritable.  She was diagnosed with borderline percent disorder but denies any history of cutting or self abusive behavior.  She endorse history of panic attack and anxiety attack and there are times that she is obsessed about cleaning, checking and counting.  Anxiety: Yes Bipolar Disorder: Yes Depression: Yes Mania: Yes Psychosis: No Schizophrenia: No Personality Disorder: Diagnosed borderline personality disorder Hospitalization for psychiatric illness: Yes History of Electroconvulsive Shock Therapy: No Prior Suicide Attempts: No  Medical History; Patient has history of anemia, obesity, history of gastric bypass surgery, kidney stone, blood transfusion and small bowel obstruction.  She see Dr. Marcelline MatesWilliam Martin.  She has gained more than 60 pounds in past one year.  Her TSH is normal.  Patient denies any history of seizures.  Past Medical History:  Diagnosis Date  . Anemia   . Anxiety   . Bipolar affective disorder (HCC)   . Blood transfusion without reported diagnosis   . Depression   . Kidney stone   . SBO (small bowel obstruction) 01/2014    Family History; Patient endorse her great aunt has schizophrenia.  She endorse multiple family member from her mother's side has mental disorder but they are in denial  Review of Systems  Skin: Negative for itching and rash.  Neurological: Negative for dizziness, tremors and headaches.  Psychiatric/Behavioral: Positive for depression. Negative for hallucinations, substance abuse and suicidal ideas. The patient is nervous/anxious and has insomnia.     Psychiatric: Agitation: Irritability Hallucination: No Depressed Mood: Yes Insomnia: Yes Hypersomnia: No  Altered Concentration: No Feels Worthless: Yes Grandiose Ideas: No Belief In Special Powers: No New/Increased Substance Abuse: No Compulsions: No  Neurologic: Headache: No Seizure: No Paresthesias: No   Outpatient Encounter Prescriptions  as of 04/12/2016  Medication Sig  . albuterol (PROVENTIL HFA;VENTOLIN HFA) 108 (90 Base) MCG/ACT inhaler Inhale 2 puffs into the lungs every 6 (six) hours as needed for wheezing or shortness of breath.  Marland Kitchen. amitriptyline (ELAVIL) 25 MG tablet Take 1 tablet (25 mg total) by mouth 3 (three) times daily.  . benzonatate (TESSALON) 100 MG capsule Take 1 capsule (100 mg total) by mouth 3 (three) times daily as needed.  . diazepam (VALIUM) 5 MG tablet Decrease 5 mg - 2.5 mg - 5 mg for two weeks, then 5 mg twice a day  . divalproex (DEPAKOTE ER) 500 MG 24 hr tablet 500 mg daily for one week, then 750 mg daily  . doxycycline (VIBRAMYCIN) 100 MG capsule Take 1 capsule (100 mg total) by mouth 2 (two) times daily.  Marland Kitchen. FLUoxetine (PROZAC) 20 MG tablet Take 3 tablets (60 mg total) by mouth daily.  . nicotine (NICODERM CQ - DOSED IN MG/24 HOURS) 14 mg/24hr patch Place 1 patch (14 mg total) onto the skin daily.  . QUEtiapine (SEROQUEL) 200 MG tablet Take 1 tablet (200 mg total) by mouth at bedtime.  Marland Kitchen. QUEtiapine (SEROQUEL) 50 MG tablet Take 1 tablet (50 mg total) by mouth 2 (two) times daily.  . valACYclovir (VALTREX) 1000 MG tablet Take 1,000 mg by mouth daily.  Marland Kitchen. zolpidem (AMBIEN) 10 MG tablet Take 1 tablet (10 mg total) by mouth at bedtime.   No facility-administered encounter medications on file as of 04/12/2016.     No results found for this or any previous visit (from the past 2160 hour(s)).  Past Medical History:  Diagnosis Date  . Anemia   . Anxiety   . Bipolar affective disorder (HCC)   . Blood transfusion without reported diagnosis   . Depression   . Kidney stone   . SBO (small bowel obstruction) (HCC) 01/2014    Constitutional:  There were no vitals taken for this visit.   Musculoskeletal: Strength & Muscle Tone: within normal limits Gait & Station: normal Patient leans: N/A  Psychiatric Specialty Exam: General Appearance: Casual and Obese  Eye Contact::  Good  Speech:  Normal Rate   Volume:  Normal  Mood:  Anxious and Depressed  Affect:  Restricted and down   Thought Process:  Coherent  Orientation:  Full (Time, Place, and Person)  Thought Content:  Logical Perceptions: denies AH/VH  Suicidal Thoughts:  No  Homicidal Thoughts:  No  Memory:  Immediate;   Fair Recent;   Fair Remote;   Fair  Judgement:  Fair  Insight:  Fair  Psychomotor Activity:  Normal  Concentration:  Fair  Recall:  [employer]  Fund of Knowledge:  Fair  Language:  Fair  Akathisia:  No  Handed:  Right  AIMS (if indicated):     Assets:  Communication Skills Desire for Improvement Financial Resources/Insurance Housing Social Support  ADL's:  Intact  Cognition:  WNL  Sleep:   poor    Established Problem, Stable/Improving (1), Review or order clinical lab tests (1), Review and summation of old records (2), Established Problem, Worsening (2), Review of Last Therapy Session (1), Review of Medication Regimen & Side Effects (2) and Review of New Medication or Change in Dosage (2)  Assessment: Ashley Franklin is a 45  year old female  with history of bipolar disorder with mixed features presented here for follow up. She is a patient of Dr. Lolly Mustache. Stelazine was tapered off and quetiapine was increased at her last visit.   # Bipolar disorder, mixed.  Anxiety disorder NOS Patient endorses hypomanic and neurovegetative symptoms since the last encounter, although there has been some improvement in her rage. Will switch from Trileptal to Depakote to target her mood symptoms. Will increase quetiapine to target her anxiety, irritability. Will plan to slowly taper off Valium given patient reports no benefit from this medication. May consider discontinue Elavil in the future to avoid polypharmacy.   Plan:  1. Discontinue Trileptal 2. Start Depakote 500 mg at night for one week, then 750 mg at night 3. Decrease Valium 5 mg - 2.5 mg - 5 mg for two weeks, then 5 mg twice a day 4. Continue fluoxetine 60 mg  daily 5. Continue Elavil 25 mg three times a day 6. Increase quetiapine 50 mg twice a day and 200 mg at night 7. Continue Ambien 10 mg at night 8. Return to clinic in one month 9. Obtain blood test after two weeks for Depakote, and liver enzyme - Recommend using squeeze ball at work to relieve her tension  The patient demonstrates the following  risk factors for suicide: Chronic risk factors for suicide include psychiatric disorder /bipolar disorder, previous self-harm of scratching herself. Acute risk factors for suicide include none. Protective factors for this patient include positive social support, positive therapeutic relationship, hope for the future. Considering these factors, the overall suicide risk at this point appears to be low. Patient does have gun access at home, which she declined to lock. Discussed in detail safety plan that anytime having active suicidal thoughts or homicidal thoughts and she need to call 911 or go to local emergency room.    [name], MD 04/11/2016

## 2016-04-12 ENCOUNTER — Telehealth: Payer: Self-pay | Admitting: *Deleted

## 2016-04-12 ENCOUNTER — Ambulatory Visit (HOSPITAL_COMMUNITY): Payer: 59 | Admitting: Psychiatry

## 2016-04-12 ENCOUNTER — Encounter (HOSPITAL_COMMUNITY): Payer: Self-pay | Admitting: Psychiatry

## 2016-04-12 DIAGNOSIS — F3131 Bipolar disorder, current episode depressed, mild: Secondary | ICD-10-CM

## 2016-04-12 MED ORDER — ZOLPIDEM TARTRATE 10 MG PO TABS: 10.0000 mg | ORAL_TABLET | Freq: Every day | ORAL | 0 refills | Status: DC

## 2016-04-12 MED ORDER — FLUOXETINE HCL 20 MG PO TABS: 60.0000 mg | ORAL_TABLET | Freq: Every day | ORAL | 0 refills | Status: DC

## 2016-04-12 MED ORDER — QUETIAPINE FUMARATE 200 MG PO TABS: 200.0000 mg | ORAL_TABLET | Freq: Every day | ORAL | 0 refills | Status: DC

## 2016-04-12 MED ORDER — QUETIAPINE FUMARATE 50 MG PO TABS: 50.0000 mg | ORAL_TABLET | Freq: Two times a day (BID) | ORAL | 0 refills | Status: DC

## 2016-04-12 MED FILL — QUETIAPINE FUMARATE 50 MG T: 50 | 30 days supply | Qty: 60 | Fill #0

## 2016-04-12 MED FILL — ZOLPIDEM TARTRATE 10 MG TAB: 10 | 30 days supply | Qty: 30 | Fill #0

## 2016-04-12 NOTE — Telephone Encounter (Signed)
Pt states she would like to set up appts to get more injections.

## 2016-04-14 NOTE — Progress Notes (Signed)
This encounter was created in error - please disregard.

## 2016-04-14 NOTE — Addendum Note (Signed)
Addended by: Neysa HotterHISADA, Lucyann Romano on: 04/14/2016 07:57 AM   Modules accepted: Kipp BroodSmartSet

## 2016-04-18 ENCOUNTER — Other Ambulatory Visit (HOSPITAL_COMMUNITY): Payer: Self-pay

## 2016-04-18 MED ORDER — DIVALPROEX SODIUM ER 500 MG PO TB24: ORAL_TABLET | ORAL | 0 refills | Status: DC

## 2016-04-18 MED FILL — DIVALPROEX SOD ER 500 MG TA: 500 | 30 days supply | Qty: 45 | Fill #0

## 2016-04-18 NOTE — Progress Notes (Signed)
[facility]   Ashley OatsJennifer S Franklin [id number] 45 y.o. Her 04/19/2016 5:01 PM  Chief Complaint:  "I like new medication"  History of Present Illness:  Patient presented for follow up appointment. Patient states that she feels much better after starting Depakote. She feels less irritable and has less mood swings. 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 She has fear that something might be happening to her family; it gets worse when she hears certain song. She has panic attack a few times per week, with dizziness and tremors.   She sleeps 8 hours. She denies feeling depressed. She denies decreased need for sleep or euphoria. She denies impulsive shopping. She denies SI, HI, AH, VH. She denies alcohol use or drug use.  Suicidal Ideation: No Plan Formed: No Patient has means to carry out plan: No  Homicidal Ideation: Yes Plan Formed: No Patient has means to carry out plan: No  Past Psychiatric History/Hospitalization(s): She has been taking antidepressants since 2005 .  Patient has one psychiatric hospitalization in March 2015 when she was very depressed and could not able to take care of herself. She had tried Lexapro, Paxil, Vistaril but limited response.  She denies any history of suicidal attempt but endorsed severe depression and manic symptoms on and off.  Her symptoms started to get worst when her husband left in 2010. She admitted history of mania, sexual inappropriate behavior and involvement in a relationship with married man, severe impulsive behavior, irritability, severe mood swing, anger issues and grandiosity.  She tried Abilify and Klonopin with good response however she could not afford Abilify.  Recently we tried again but this time it did not help her and make her more irritable.  She was diagnosed with borderline percent disorder but denies any history of cutting or self abusive  behavior.  She endorse history of panic attack and anxiety attack and there are times that she is obsessed about cleaning, checking and counting.  Anxiety: Yes Bipolar Disorder: Yes Depression: Yes Mania: Yes Psychosis: No Schizophrenia: No Personality Disorder: Diagnosed borderline personality disorder Hospitalization for psychiatric illness: Yes History of Electroconvulsive Shock Therapy: No Prior Suicide Attempts: No  Medical History; Patient has history of anemia, obesity, history of gastric bypass surgery, kidney stone, blood transfusion and small bowel obstruction.  She see Dr. Marcelline MatesWilliam Martin.  She has gained more than 60 pounds in past one year.  Her TSH is normal.  Patient denies any history of seizures.  Past Medical History:  Diagnosis Date  . Anemia   . Anxiety   . Bipolar affective disorder (HCC)   . Blood transfusion without reported diagnosis   . Depression   . Kidney stone   . SBO (small bowel obstruction) 01/2014    Family History; Patient endorse her great aunt has schizophrenia.  She endorse multiple family member from her mother's side has mental disorder but they are in denial  Review of Systems  Skin: Negative for itching and rash.  Neurological: Positive for dizziness and tremors. Negative for headaches.  Psychiatric/Behavioral: Negative for depression, hallucinations, substance abuse and suicidal ideas. The patient is nervous/anxious. The patient does not have insomnia.   All other systems reviewed and are negative.   Psychiatric: Agitation: No Hallucination: No Depressed Mood: No Insomnia: No Hypersomnia: No Altered Concentration: No Feels Worthless: No Grandiose Ideas: No Belief In Special Powers: No New/Increased Substance Abuse: No Compulsions: No  Neurologic: Headache: No Seizure: No Paresthesias: No   Outpatient Encounter Prescriptions as of 04/19/2016  Medication Sig  . albuterol (PROVENTIL HFA;VENTOLIN HFA) 108 (90 Base) MCG/ACT  inhaler Inhale 2 puffs into the lungs every 6 (six) hours as needed for wheezing or shortness of breath. (Patient not taking: Reported on 04/12/2016)  . amitriptyline (ELAVIL) 25 MG tablet Take 1 tablet (25 mg total) by mouth 3 (three) times daily. (Patient not taking: Reported on 04/12/2016)  . benzonatate (TESSALON) 100 MG capsule Take 1 capsule (100 mg total) by mouth 3 (three) times daily as needed. (Patient not taking: Reported on 04/12/2016)  . diazepam (VALIUM) 5 MG tablet Decrease 5 mg - 2.5 mg - 5 mg for two weeks, then 5 mg twice a day  . divalproex (DEPAKOTE ER) 500 MG 24 hr tablet then 750 mg daily  . doxycycline (VIBRAMYCIN) 100 MG capsule Take 1 capsule (100 mg total) by mouth 2 (two) times daily. (Patient not taking: Reported on 04/12/2016)  . FLUoxetine (PROZAC) 20 MG tablet Take 3 tablets (60 mg total) by mouth daily.  . nicotine (NICODERM CQ - DOSED IN MG/24 HOURS) 14 mg/24hr patch Place 1 patch (14 mg total) onto the skin daily.  . QUEtiapine (SEROQUEL) 200 MG tablet Take 1 tablet (200 mg total) by mouth at bedtime.  Marland Kitchen QUEtiapine (SEROQUEL) 50 MG tablet Take 1 tablet (50 mg total) by mouth 2 (two) times daily.  . valACYclovir (VALTREX) 1000 MG tablet Take 1,000 mg by mouth daily.  Marland Kitchen zolpidem (AMBIEN) 10 MG tablet Take 1 tablet (10 mg total) by mouth at bedtime.   No facility-administered encounter medications on file as of 04/19/2016.     No results found for this or any previous visit (from the past 2160 hour(s)).  Past Medical History:  Diagnosis Date  . Anemia   . Anxiety   . Bipolar affective disorder (HCC)   . Blood transfusion without reported diagnosis   . Depression   . Kidney stone   . SBO (small bowel obstruction) (HCC) 01/2014    Constitutional:  BP 126/74   Pulse 76   Ht 5\' 4"  (1.626 m)   Wt 239 lb (108.4 kg)   BMI 41.02 kg/m    Musculoskeletal: Strength & Muscle Tone: within normal limits Gait & Station: normal Patient leans: N/A  Psychiatric  Specialty Exam: General Appearance: Casual  Eye Contact::  Good  Speech:  Normal Rate  Volume:  Normal  Mood:  "better"  Affect:  appropriately brighter   Thought Process:  Coherent  Orientation:  Full (Time, Place, and Person)  Thought Content:  Logical Perceptions: denies AH/VH  Suicidal Thoughts:  No  Homicidal Thoughts:  No  Memory:  Immediate;   Fair Recent;   Fair Remote;   Fair  Judgement:  Fair  Insight:  Fair  Psychomotor Activity:  Normal  Concentration:  Fair  Recall:  [employer] of Knowledge:  Fair  Language:  Fair  Akathisia:  No  Handed:  Right  AIMS (if indicated):     Assets:  Communication Skills Desire for Improvement Financial Resources/Insurance Housing Social Support  ADL's:  Intact  Cognition:  WNL  Sleep:   poor    Established Problem, Stable/Improving (1), Review or order clinical lab tests (1), Review and summation of old records (2), Established Problem, Worsening (2), Review of Last Therapy Session (1), Review of Medication Regimen & Side Effects (2) and Review of New Medication or Change in Dosage (2)  Assessment:  Ashley Franklin is a 45  year old female with history of bipolar disorder with mixed features presented here for follow up.   # Bipolar disorder, mixed.   # Anxiety disorder NOS There has been significant improvement in her mood symptoms after starting Depakote. Will uptitrate the dose to optimize its effect; side effect including confusion, tremors, liver function abnormality are discussed. Patient to obtain blood work. Will continue quetiapine at the current dose; this medication may be uptitrated in the future if she continues to endorse anxiety. Will continue Valium for anxiety; patient agrees to taper off in the future. Will consider discontinue Elavil in the future to avoid polypharmacy.   Plan:  1. Increase Depakote 1000 mg at night 2. Continue Valium 5 mg twice a day 3. Continue fluoxetine 60 mg daily 4. Continue Elavil 25  mg three times a day 5. Continue quetiapine 50 mg twice a day and 200 mg at night 6. Continue Ambien 10 mg at night 7. Return to clinic in January 8. Obtain blood test after one week- VPA, CMP, TSH, Lipid panels  The patient demonstrates the following  risk factors for suicide: Chronic risk factors for suicide include psychiatric disorder /bipolar disorder, previous self-harm of scratching herself. Acute risk factors for suicide include none. Protective factors for this patient include positive social support, positive therapeutic relationship, hope for the future. Considering these factors, the overall suicide risk at this point appears to be low. Patient does have gun access at home, which she declined to lock. Discussed in detail safety plan that anytime having active suicidal thoughts or homicidal thoughts and she need to call 911 or go to local emergency room.   Neysa Hottereina Macie Baum, MD 04/19/2016

## 2016-04-19 ENCOUNTER — Other Ambulatory Visit (HOSPITAL_COMMUNITY): Payer: Self-pay

## 2016-04-19 ENCOUNTER — Encounter (HOSPITAL_COMMUNITY): Payer: Self-pay | Admitting: Psychiatry

## 2016-04-19 ENCOUNTER — Ambulatory Visit (INDEPENDENT_AMBULATORY_CARE_PROVIDER_SITE_OTHER): Payer: 59 | Admitting: Psychiatry

## 2016-04-19 DIAGNOSIS — F411 Generalized anxiety disorder: Secondary | ICD-10-CM

## 2016-04-19 DIAGNOSIS — R45850 Homicidal ideations: Secondary | ICD-10-CM | POA: Diagnosis not present

## 2016-04-19 DIAGNOSIS — Z79899 Other long term (current) drug therapy: Secondary | ICD-10-CM

## 2016-04-19 DIAGNOSIS — F3131 Bipolar disorder, current episode depressed, mild: Secondary | ICD-10-CM

## 2016-04-19 MED ORDER — QUETIAPINE FUMARATE 200 MG PO TABS: 200.0000 mg | ORAL_TABLET | Freq: Every day | ORAL | 1 refills | Status: DC

## 2016-04-19 MED ORDER — ZOLPIDEM TARTRATE 10 MG PO TABS: 10.0000 mg | ORAL_TABLET | Freq: Every day | ORAL | 1 refills | Status: DC

## 2016-04-19 MED ORDER — DIAZEPAM 5 MG PO TABS: 5.0000 mg | ORAL_TABLET | Freq: Two times a day (BID) | ORAL | 1 refills | Status: DC

## 2016-04-19 MED ORDER — DIVALPROEX SODIUM ER 500 MG PO TB24: 1000.0000 mg | ORAL_TABLET | Freq: Every day | ORAL | 1 refills | Status: DC

## 2016-04-19 MED ORDER — QUETIAPINE FUMARATE 50 MG PO TABS: 50.0000 mg | ORAL_TABLET | Freq: Two times a day (BID) | ORAL | 1 refills | Status: DC

## 2016-04-19 MED ORDER — FLUOXETINE HCL 20 MG PO TABS: 60.0000 mg | ORAL_TABLET | Freq: Every day | ORAL | 1 refills | Status: DC

## 2016-04-19 MED ORDER — AMITRIPTYLINE HCL 25 MG PO TABS: 25.0000 mg | ORAL_TABLET | Freq: Three times a day (TID) | ORAL | 1 refills | Status: DC

## 2016-04-19 NOTE — Patient Instructions (Addendum)
1. Increase depakote 1000 mg at night 2. Continue Valium 5 mg twice a day 3. Continue fluoxetine 60 mg daily 4. Continue Elavil 25 mg three times a day 5. Continue quetiapine 50 mg twice a day and 200 mg at night 6. Continue Ambien 10 mg at night 7. Return to clinic in January 8. Obtain blood test after one week

## 2016-04-19 NOTE — Telephone Encounter (Signed)
Left message for pt to call office to schedule appt with Dr. Ardelle AntonWagoner

## 2016-04-26 MED FILL — FLUoxetine HCL 20 MG TABS: 20 | 30 days supply | Qty: 90 | Fill #0

## 2016-04-26 MED FILL — diazePAM 5 MG TABS: 5 | 30 days supply | Qty: 60 | Fill #0

## 2016-05-02 ENCOUNTER — Encounter: Payer: Self-pay | Admitting: Podiatry

## 2016-05-02 ENCOUNTER — Ambulatory Visit (INDEPENDENT_AMBULATORY_CARE_PROVIDER_SITE_OTHER): Payer: 59 | Admitting: Podiatry

## 2016-05-02 DIAGNOSIS — M722 Plantar fascial fibromatosis: Secondary | ICD-10-CM

## 2016-05-03 ENCOUNTER — Ambulatory Visit: Payer: Self-pay | Admitting: Family Medicine

## 2016-05-03 ENCOUNTER — Telehealth: Payer: Self-pay | Admitting: *Deleted

## 2016-05-03 DIAGNOSIS — Z0289 Encounter for other administrative examinations: Secondary | ICD-10-CM

## 2016-05-03 DIAGNOSIS — M722 Plantar fascial fibromatosis: Secondary | ICD-10-CM

## 2016-05-03 NOTE — Telephone Encounter (Addendum)
-----   Message from Vivi BarrackMatthew R Wagoner, DPM sent at 05/02/2016  3:07 PM EST ----- Can you please order an MRI of the RIGHT foot to rule out plantar fasical tear? Thanks. 05/03/2016-[facility].

## 2016-05-05 NOTE — Progress Notes (Signed)
Subjective: 45 year old female presents the office they for concerns of reoccurring bilateral heel pain to the right side much worse than the left. She states that after the orthotics they were doing very well and her foot pain was much improved however her pain is resorted back to what it was originally. She states that she's having difficulty at work getting on a walking and stated on concrete floors at work due to the heel pain. She denies any recent injury or trauma. No numbness or tingling. No swelling or redness. She has continue with stretching, icing daily at home as well as well as the other treatments we talked about previously. Denies any systemic complaints such as fevers, chills, nausea, vomiting. No acute changes since last appointment, and no other complaints at this time.   Objective: AAO x3, NAD DP/PT pulses palpable bilaterally, CRT less than 3 seconds There is recurrence of tenderness to palpation along the plantar medial tubercle of the calcaneus at the insertion of plantar fascia on the right >> left foot. There is no pain along the course of the plantar fascia within the arch of the foot. Plantar fascia appears to be intact. There is no pain with lateral compression of the calcaneus or pain with vibratory sensation. There is no pain along the course or insertion of the achilles tendon. No other areas of tenderness to bilateral lower extremities. No open lesions or pre-ulcerative lesions.  No pain with calf compression, swelling, warmth, erythema  Assessment: Recurrent plantar fasciitis right side much worse unless.  Plan: -All treatment options discussed with the patient including all alternatives, risks, complications.  -Patient elects to proceed with steroid injection into the right heel. Under sterile skin preparation, a total of 2.5cc of kenalog 10, 0.5% Marcaine plain, and 2% lidocaine plain were infiltrated into the symptomatic area without complication. A band-aid was  applied. Patient tolerated the injection well without complication. Post-injection care with discussed with the patient. Discussed with the patient to ice the area over the next couple of days to help prevent a steroid flare.  -CAM boot dispensed for the right foot -Continue orthotics on left.  -Continue stretching and icing every other treatment that we talked about previously. -Ordered MRI of the right.  -Discussed PT but will await the results of the MRI.  -Patient encouraged to call the office with any questions, concerns, change in symptoms.   Ovid CurdMatthew Wagoner, DPM

## 2016-05-10 MED FILL — ZOLPIDEM TARTRATE 10 MG TAB: 10 | 30 days supply | Qty: 30 | Fill #0

## 2016-05-10 MED FILL — DIVALPROEX SOD ER 500 MG TA: 500 | 30 days supply | Qty: 60 | Fill #0

## 2016-05-10 MED FILL — AMITRIPTYLINE HCL 25 MG TAB: 25 | 30 days supply | Qty: 90 | Fill #0

## 2016-05-10 MED FILL — QUETIAPINE FUMARATE 50 MG T: 50 | 30 days supply | Qty: 60 | Fill #0

## 2016-05-10 MED FILL — QUETIAPINE FUMARATE 200 MG: 200 | 30 days supply | Qty: 30 | Fill #0

## 2016-05-26 ENCOUNTER — Other Ambulatory Visit (HOSPITAL_COMMUNITY): Payer: Self-pay | Admitting: Psychiatry

## 2016-05-26 DIAGNOSIS — F3131 Bipolar disorder, current episode depressed, mild: Secondary | ICD-10-CM | POA: Diagnosis not present

## 2016-05-27 LAB — CBC WITH DIFFERENTIAL/PLATELET
BASOS ABS: 0 10*3/uL (ref 0.0–0.2)
Basos: 0 %
EOS (ABSOLUTE): 0.2 10*3/uL (ref 0.0–0.4)
Eos: 3 %
Hematocrit: 42 % (ref 34.0–46.6)
Hemoglobin: 13.7 g/dL (ref 11.1–15.9)
IMMATURE GRANULOCYTES: 0 %
Immature Grans (Abs): 0 10*3/uL (ref 0.0–0.1)
LYMPHS: 32 %
Lymphocytes Absolute: 2.5 10*3/uL (ref 0.7–3.1)
MCH: 29.8 pg (ref 26.6–33.0)
MCHC: 32.6 g/dL (ref 31.5–35.7)
MCV: 92 fL (ref 79–97)
MONOS ABS: 0.5 10*3/uL (ref 0.1–0.9)
Monocytes: 6 %
NEUTROS PCT: 59 %
Neutrophils Absolute: 4.6 10*3/uL (ref 1.4–7.0)
PLATELETS: 265 10*3/uL (ref 150–379)
RBC: 4.59 x10E6/uL (ref 3.77–5.28)
RDW: 15.9 % — AB (ref 12.3–15.4)
WBC: 7.8 10*3/uL (ref 3.4–10.8)

## 2016-05-27 LAB — COMPREHENSIVE METABOLIC PANEL
A/G RATIO: 1.7 (ref 1.2–2.2)
ALT: 15 IU/L (ref 0–32)
AST: 17 IU/L (ref 0–40)
Albumin: 3.8 g/dL (ref 3.5–5.5)
Alkaline Phosphatase: 55 IU/L (ref 39–117)
BUN/Creatinine Ratio: 15 (ref 9–23)
BUN: 9 mg/dL (ref 6–24)
Bilirubin Total: <0.2 mg/dL (ref 0.0–1.2)
CALCIUM: 8.7 mg/dL (ref 8.7–10.2)
CO2: 23 mmol/L (ref 18–29)
Chloride: 102 mmol/L (ref 96–106)
Creatinine, Ser: 0.61 mg/dL (ref 0.57–1.00)
GFR calc Af Amer: 128 mL/min/{1.73_m2} (ref >59)
GFR, EST NON AFRICAN AMERICAN: 111 mL/min/{1.73_m2} (ref >59)
GLUCOSE: 77 mg/dL (ref 65–99)
Globulin, Total: 2.2 g/dL (ref 1.5–4.5)
POTASSIUM: 5 mmol/L (ref 3.5–5.2)
Sodium: 140 mmol/L (ref 134–144)
Total Protein: 6 g/dL (ref 6.0–8.5)

## 2016-05-27 LAB — VALPROIC ACID LEVEL: Valproic Acid Lvl: 33 ug/mL — ABNORMAL LOW (ref 50–100)

## 2016-06-01 MED FILL — FLUoxetine HCL 20 MG TABS: 20 | 30 days supply | Qty: 90 | Fill #0

## 2016-06-05 MED FILL — ZOLPIDEM TARTRATE 10 MG TAB: 10 | 30 days supply | Qty: 30 | Fill #1

## 2016-06-05 MED FILL — AMITRIPTYLINE HCL 25 MG TAB: 25 | 30 days supply | Qty: 90 | Fill #0

## 2016-06-05 MED FILL — DIVALPROEX SOD ER 500 MG TA: 500 | 30 days supply | Qty: 60 | Fill #1

## 2016-06-05 MED FILL — QUETIAPINE FUMARATE 50 MG T: 50 | 30 days supply | Qty: 60 | Fill #1

## 2016-06-05 MED FILL — QUETIAPINE FUMARATE 200 MG: 200 | 30 days supply | Qty: 30 | Fill #1

## 2016-06-06 ENCOUNTER — Telehealth: Payer: Self-pay | Admitting: Physician Assistant

## 2016-06-06 NOTE — Telephone Encounter (Signed)
With her endorsed symptoms, she needs ER assessment.

## 2016-06-06 NOTE — Telephone Encounter (Signed)
LMOVM advising patient to go to ER for reassessment for symptoms.

## 2016-06-06 NOTE — Telephone Encounter (Signed)
Patient Name: Ashley Franklin DOB: August 11, 1970 Initial Comment caller states she has tremors, dizziness, confusion Nurse Assessment Nurse: Lane HackerHarley, RN, Windy Date/Time (Eastern Time): 06/06/2016 2:47:19 PM Confirm and document reason for call. If symptomatic, describe symptoms. ---Caller states she has tremors, dizziness - like spinning, confusion - RN clarified and she has trouble finding her words, she is oriented x 3. c/o short term memory loss. Also, c/o involuntary reflexes spastic of elbows, and knees. Noticed especially in last 2-4 wks. -- Dizziness last happened at lunch today. --Jerks last had last night; and tremors were during work today. Does the patient have any new or worsening symptoms? ---Yes Will a triage be completed? ---Yes Related visit to physician within the last 2 weeks? ---No Does the PT have any chronic conditions? (i.e. diabetes, asthma, etc.) ---Yes List chronic conditions. ---Anxiety, depression Is the patient pregnant or possibly pregnant? (Ask all females between the ages of 2112-55) ---No Is this a behavioral health or substance abuse call? ---No Guidelines Guideline Title Affirmed Question Affirmed Notes Dizziness - Vertigo Taking a medicine that could cause dizziness (e.g., phenytoin [Dilantin], carbamazepine [Tegretol], primidone [Mysoline]) Final Disposition User See Physician within 4 Hours (or PCP triage) Lane HackerHarley, RN, Windy Comments She started Depakote around Thanksgiving. She works til 5:30 pm. RN advised ED or UC. Referrals GO TO FACILITY UNDECIDED Disagree/Comply: Comply

## 2016-06-06 NOTE — Telephone Encounter (Signed)
Patient called to schedule an appointment with symptoms of; tremors, random reflexes, dizziness, confusion, memory loss, and she also stated her hand writing is different. Transferred to [facility]

## 2016-06-07 NOTE — Telephone Encounter (Signed)
Patient needs evaluation in office if she is refusing ER or emergent assessment. Referral can be placed but there is no telling how soon she will be able to be seen. Ok to place referral but she needs acute appointment with me ASAP to evaluate this symptoms. Again if there is confusion, etc, would recommend ER assessment.

## 2016-06-07 NOTE — Progress Notes (Deleted)
[facility]   Ashley OatsJennifer S Franklin [id number] 46 y.o. Her 06/07/2016 3:59 PM  Chief Complaint:  "I like new medication"  History of Present Illness:  Patient presented for follow up appointment.   Feel great,  Tremors, involuntary muscle spasms,  Difficulty finding words at times, unable   Appointment with neurologist Needs a note,  smooth   1/23 Patient called to schedule an appointment with symptoms of; tremors, random reflexes, dizziness, confusion, memory loss, and she also stated her hand writing is different. Transferred    - recommended to go to ED Patient states that she feels much better after starting Depakote. She feels less irritable and has less mood swings. 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 She has fear that something might be happening to her family; it gets worse when she hears certain song. She has panic attack a few times per week, with dizziness and tremors.   She sleeps 8 hours. She denies feeling depressed. She denies decreased need for sleep or euphoria. She denies impulsive shopping. She denies SI, HI, AH, VH. She denies alcohol use or drug use.  Suicidal Ideation: No Plan Formed: No Patient has means to carry out plan: No  Homicidal Ideation: Yes Plan Formed: No Patient has means to carry out plan: No  Past Psychiatric History/Hospitalization(s): She has been taking antidepressants since 2005 .  Patient has one psychiatric hospitalization in March 2015 when she was very depressed and could not able to take care of herself. She had tried Lexapro, Paxil, Vistaril but limited response.  She denies any history of suicidal attempt but endorsed severe depression and manic symptoms on and off.  Her symptoms started to get worst when her husband left in 2010. She admitted history of mania, sexual inappropriate behavior and involvement in a relationship with  married man, severe impulsive behavior, irritability, severe mood swing, anger issues and grandiosity.  She tried Abilify and Klonopin with good response however she could not afford Abilify.  Recently we tried again but this time it did not help her and make her more irritable.  She was diagnosed with borderline percent disorder but denies any history of cutting or self abusive behavior.  She endorse history of panic attack and anxiety attack and there are times that she is obsessed about cleaning, checking and counting.  Anxiety: Yes Bipolar Disorder: Yes Depression: Yes Mania: Yes Psychosis: No Schizophrenia: No Personality Disorder: Diagnosed borderline personality disorder Hospitalization for psychiatric illness: Yes History of Electroconvulsive Shock Therapy: No Prior Suicide Attempts: No  Medical History; Patient has history of anemia, obesity, history of gastric bypass surgery, kidney stone, blood transfusion and small bowel obstruction.  She see Dr. Marcelline MatesWilliam Martin.  She has gained more than 60 pounds in past one year.  Her TSH is normal.  Patient denies any history of seizures.  Past Medical History:  Diagnosis Date  . Anemia   . Anxiety   . Bipolar affective disorder (HCC)   . Blood transfusion without reported diagnosis   . Depression   . Kidney stone   . SBO (small bowel obstruction) 01/2014    Family History; Patient endorse her great aunt has schizophrenia.  She endorse multiple family member from her mother's side has mental disorder but they are in denial  Review of Systems  Skin: Negative for itching and rash.  Neurological: Positive for dizziness and tremors. Negative for headaches.  Psychiatric/Behavioral: Negative for depression, hallucinations, substance abuse and suicidal ideas. The patient is nervous/anxious. The patient does not have insomnia.   All other systems reviewed and are negative.   Psychiatric: Agitation: No Hallucination: No Depressed Mood:  No Insomnia: No Hypersomnia: No Altered Concentration: No Feels Worthless: No Grandiose Ideas: No Belief In Special Powers: No New/Increased Substance Abuse: No Compulsions: No  Neurologic: Headache: No Seizure: No Paresthesias: No   Outpatient Encounter Prescriptions as of 06/14/2016  Medication Sig  . albuterol (PROVENTIL HFA;VENTOLIN HFA) 108 (90 Base) MCG/ACT inhaler Inhale 2 puffs into the lungs every 6 (six) hours as needed for wheezing or shortness of breath. (Patient not taking: Reported on 04/12/2016)  . amitriptyline (ELAVIL) 25 MG tablet Take 1 tablet (25 mg total) by mouth 3 (three) times daily.  . benzonatate (TESSALON) 100 MG capsule Take 1 capsule (100 mg total) by mouth 3 (three) times daily as needed. (Patient not taking: Reported on 04/12/2016)  . diazepam (VALIUM) 5 MG tablet Take 1 tablet (5 mg total) by mouth 2 (two) times daily.  . divalproex (DEPAKOTE ER) 500 MG 24 hr tablet Take 2 tablets (1,000 mg total) by mouth at bedtime.  Marland Kitchen doxycycline (VIBRAMYCIN) 100 MG capsule Take 1 capsule (100 mg total) by mouth 2 (two) times daily. (Patient not taking: Reported on 04/12/2016)  . FLUoxetine (PROZAC) 20 MG tablet Take 3 tablets (60 mg total) by mouth daily.  . nicotine (NICODERM CQ - DOSED IN MG/24 HOURS) 14 mg/24hr patch Place 1 patch (14 mg total) onto the skin daily.  . QUEtiapine (SEROQUEL) 200 MG tablet Take 1 tablet (200 mg total) by mouth at bedtime.  Marland Kitchen QUEtiapine (SEROQUEL) 50 MG tablet Take 1 tablet (50 mg total) by mouth 2 (two) times daily.  . valACYclovir (VALTREX) 1000 MG tablet Take 1,000 mg by mouth daily.  Marland Kitchen zolpidem (AMBIEN) 10 MG tablet Take 1 tablet (10 mg total) by mouth at bedtime.   No facility-administered encounter medications on file as of 06/14/2016.     Recent Results (from the past 2160 hour(s))  CBC with Differential/Platelet     Status: Abnormal   Collection Time: 05/26/16  1:17 PM  Result Value Ref Range   WBC 7.8 3.4 - 10.8 x10E3/uL    RBC 4.59 3.77 - 5.28 x10E6/uL   Hemoglobin 13.7 11.1 - 15.9 g/dL   Hematocrit 16.1 09.6 - 46.6 %   MCV 92 79 - 97 fL   MCH 29.8 26.6 - 33.0 pg   MCHC 32.6 31.5 - 35.7 g/dL   RDW 04.5 (H) 40.9 - 81.1 %   Platelets 265 150 - 379 x10E3/uL   Neutrophils 59 Not Estab. %   Lymphs 32 Not Estab. %   Monocytes 6 Not Estab. %   Eos 3 Not Estab. %   Basos 0 Not Estab. %   Neutrophils Absolute 4.6 1.4 - 7.0 x10E3/uL   Lymphocytes Absolute 2.5 0.7 - 3.1 x10E3/uL   Monocytes Absolute 0.5 0.1 - 0.9 x10E3/uL   EOS (ABSOLUTE) 0.2 0.0 - 0.4 x10E3/uL   Basophils Absolute 0.0 0.0 - 0.2 x10E3/uL   Immature Granulocytes 0 Not Estab. %   Immature Grans (Abs) 0.0 0.0 - 0.1 x10E3/uL  Comprehensive metabolic panel     Status: None   Collection Time: 05/26/16  1:17 PM  Result Value Ref Range   Glucose 77 65 - 99 mg/dL   BUN 9 6 - 24 mg/dL   Creatinine, Ser 9.14 0.57 - 1.00 mg/dL  GFR calc non Af Amer 111 >59 mL/min/1.73   GFR calc Af Amer 128 >59 mL/min/1.73   BUN/Creatinine Ratio 15 9 - 23   Sodium 140 134 - 144 mmol/L   Potassium 5.0 3.5 - 5.2 mmol/L   Chloride 102 96 - 106 mmol/L   CO2 23 18 - 29 mmol/L   Calcium 8.7 8.7 - 10.2 mg/dL   Total Protein 6.0 6.0 - 8.5 g/dL   Albumin 3.8 3.5 - 5.5 g/dL   Globulin, Total 2.2 1.5 - 4.5 g/dL   Albumin/Globulin Ratio 1.7 1.2 - 2.2   Bilirubin Total <0.2 0.0 - 1.2 mg/dL   Alkaline Phosphatase 55 39 - 117 IU/L   AST 17 0 - 40 IU/L   ALT 15 0 - 32 IU/L  Valproic acid level     Status: Abnormal   Collection Time: 05/26/16  1:17 PM  Result Value Ref Range   Valproic Acid Lvl 33 (L) 50 - 100 ug/mL    Comment:                                 Detection Limit = 4                            <4 indicates None Detected Toxicity may occur at levels of 100-500. Measurements of free unbound valproic acid may improve the assess- ment of clinical response.     Past Medical History:  Diagnosis Date  . Anemia   . Anxiety   . Bipolar affective disorder (HCC)    . Blood transfusion without reported diagnosis   . Depression   . Kidney stone   . SBO (small bowel obstruction) (HCC) 01/2014    Constitutional:  There were no vitals taken for this visit.   Musculoskeletal: Strength & Muscle Tone: within normal limits Gait & Station: normal Patient leans: N/A  Psychiatric Specialty Exam: General Appearance: Casual  Eye Contact::  Good  Speech:  Normal Rate  Volume:  Normal  Mood:  "better"  Affect:  appropriately brighter   Thought Process:  Coherent  Orientation:  Full (Time, Place, and Person)  Thought Content:  Logical Perceptions: denies AH/VH  Suicidal Thoughts:  No  Homicidal Thoughts:  No  Memory:  Immediate;   Fair Recent;   Fair Remote;   Fair  Judgement:  Fair  Insight:  Fair  Psychomotor Activity:  Normal  Concentration:  Fair  Recall:  [employer] of Knowledge:  Fair  Language:  Fair  Akathisia:  No  Handed:  Right  AIMS (if indicated):     Assets:  Communication Skills Desire for Improvement Financial Resources/Insurance Housing Social Support  ADL's:  Intact  Cognition:  WNL  Sleep:   poor    Established Problem, Stable/Improving (1), Review or order clinical lab tests (1), Review and summation of old records (2), Established Problem, Worsening (2), Review of Last Therapy Session (1), Review of Medication Regimen & Side Effects (2) and Review of New Medication or Change in Dosage (2)  Assessment: Ashley Franklin is a 46  year old female with history of bipolar disorder with mixed features presented here for follow up.   # Bipolar disorder, mixed.   # Anxiety disorder NOS There has been significant improvement in her mood symptoms after starting Depakote. Will uptitrate the dose to optimize its effect; side effect including confusion, tremors,  liver function abnormality are discussed. Patient to obtain blood work. Will continue quetiapine at the current dose; this medication may be uptitrated in the future if  she continues to endorse anxiety. Will continue Valium for anxiety; patient agrees to taper off in the future. Will consider discontinue Elavil in the future to avoid polypharmacy.   Plan:  1. Increase Depakote 1000 mg at night 2. Decrease Valium 2mg  twice a day 3. Continue fluoxetine 60 mg daily 4. Continue Elavil 25 mg three times a day 5. Continue quetiapine 50 mg twice a day and 200 mg at night 6. Continue Ambien 10 mg at night 7. Return to clinic in January 8. Obtain blood test after one week- VPA, CMP, TSH, Lipid panels  The patient demonstrates the following  risk factors for suicide: Chronic risk factors for suicide include psychiatric disorder /bipolar disorder, previous self-harm of scratching herself. Acute risk factors for suicide include none. Protective factors for this patient include positive social support, positive therapeutic relationship, hope for the future. Considering these factors, the overall suicide risk at this point appears to be low. Patient does have gun access at home, which she declined to lock. Discussed in detail safety plan that anytime having active suicidal thoughts or homicidal thoughts and she need to call 911 or go to local emergency room.   [name], MD 06/07/2016

## 2016-06-07 NOTE — Telephone Encounter (Signed)
Patient coming in at 8:15 tomorrow morning for evaluation and referral.

## 2016-06-07 NOTE — Telephone Encounter (Signed)
Patient just called and said that she did get the message about ER for reassessment, and she does not feel like she needs to go to the ER.  She stated that she is trying to get in with Neurology, but they require a referral.   She is asking if a referral can be done to either Dr. Vicenta AlyYuan San or Dr. Curt BearsLirim Tonuzi. Patient aware that I am routing the message to the provider to advise.

## 2016-06-08 ENCOUNTER — Ambulatory Visit (INDEPENDENT_AMBULATORY_CARE_PROVIDER_SITE_OTHER): Payer: 59 | Admitting: Physician Assistant

## 2016-06-08 ENCOUNTER — Encounter: Payer: Self-pay | Admitting: Physician Assistant

## 2016-06-08 VITALS — BP 102/70 | HR 89 | Temp 98.5°F | Resp 16 | Ht 64.0 in | Wt 240.0 lb

## 2016-06-08 DIAGNOSIS — R259 Unspecified abnormal involuntary movements: Secondary | ICD-10-CM

## 2016-06-08 DIAGNOSIS — R202 Paresthesia of skin: Secondary | ICD-10-CM

## 2016-06-08 DIAGNOSIS — M79602 Pain in left arm: Secondary | ICD-10-CM | POA: Diagnosis not present

## 2016-06-08 DIAGNOSIS — M79601 Pain in right arm: Secondary | ICD-10-CM | POA: Diagnosis not present

## 2016-06-08 LAB — CBC WITH DIFFERENTIAL/PLATELET
BASOS PCT: 0.3 % (ref 0.0–3.0)
Basophils Absolute: 0 10*3/uL (ref 0.0–0.1)
EOS PCT: 3 % (ref 0.0–5.0)
Eosinophils Absolute: 0.2 10*3/uL (ref 0.0–0.7)
HEMATOCRIT: 42 % (ref 36.0–46.0)
HEMOGLOBIN: 14.3 g/dL (ref 12.0–15.0)
LYMPHS PCT: 29 % (ref 12.0–46.0)
Lymphs Abs: 1.8 10*3/uL (ref 0.7–4.0)
MCHC: 34 g/dL (ref 30.0–36.0)
MCV: 89.4 fl (ref 78.0–100.0)
MONO ABS: 0.3 10*3/uL (ref 0.1–1.0)
Monocytes Relative: 5.4 % (ref 3.0–12.0)
Neutro Abs: 3.9 10*3/uL (ref 1.4–7.7)
Neutrophils Relative %: 62.3 % (ref 43.0–77.0)
Platelets: 265 10*3/uL (ref 150.0–400.0)
RBC: 4.7 Mil/uL (ref 3.87–5.11)
RDW: 15.4 % (ref 11.5–15.5)
WBC: 6.3 10*3/uL (ref 4.0–10.5)

## 2016-06-08 LAB — COMPREHENSIVE METABOLIC PANEL
ALBUMIN: 3.8 g/dL (ref 3.5–5.2)
ALK PHOS: 51 U/L (ref 39–117)
ALT: 17 U/L (ref 0–35)
AST: 18 U/L (ref 0–37)
BUN: 14 mg/dL (ref 6–23)
CALCIUM: 9.1 mg/dL (ref 8.4–10.5)
CO2: 27 mEq/L (ref 19–32)
CREATININE: 0.65 mg/dL (ref 0.40–1.20)
Chloride: 108 mEq/L (ref 96–112)
GFR: 105.43 mL/min (ref >60.00)
Glucose, Bld: 78 mg/dL (ref 70–99)
POTASSIUM: 4.4 meq/L (ref 3.5–5.1)
SODIUM: 139 meq/L (ref 135–145)
TOTAL PROTEIN: 6 g/dL (ref 6.0–8.3)
Total Bilirubin: 0.2 mg/dL (ref 0.2–1.2)

## 2016-06-08 LAB — TSH: TSH: 1.22 u[IU]/mL (ref 0.35–4.50)

## 2016-06-08 LAB — T4, FREE: FREE T4: 0.84 ng/dL (ref 0.60–1.60)

## 2016-06-08 NOTE — Patient Instructions (Addendum)
Please go to the lab for blood work.  I will call you with your results.  Make sure you follow-up with Psychiatry as scheduled this week. Concerned that your significant psychotropic medications are contributing but this needs further assessment. I have placed an urgent referral to Neurology for assessment.   If you do not hear from them by Friday, please call us. If there is any worsening symptoms or change in symptoms, please go to the ER.

## 2016-06-08 NOTE — Progress Notes (Signed)
Pre visit review using our clinic review tool, if applicable. No additional management support is needed unless otherwise documented below in the visit note. 

## 2016-06-09 ENCOUNTER — Encounter: Payer: Self-pay | Admitting: Emergency Medicine

## 2016-06-11 NOTE — Progress Notes (Signed)
Patient presents to clinic today c/o multiple neurological symptoms noted over the past 3 months, worsening within past couple of weeks. Patient endorses first noting tremors about 3 months ago. States giving her medication regimen, she thought may be related and did not give much thought. Now endorses mental sluggishness intermittently with some noted balance issues. Denies true vertigo. Denies lightheadedness or syncope. Denies vision changes. Notes occasional crawling sensation over her arms.  Patient is currently followed by Psychiatry for Bipolar Affective Disorder. Is on significant medication regimen including Amitriptyline 25 mg TID, Seroquel BID, Fluoxetine 60 mg QD, Valium 5 mg BID and Depakote XR 1000 mg QHS. Endorses mood is the best it has been in years.  Past Medical History:  Diagnosis Date  . Anemia   . Anxiety   . Bipolar affective disorder ([place])   . Blood transfusion without reported diagnosis   . Depression   . Kidney stone   . SBO (small bowel obstruction) 01/2014    Current Outpatient Prescriptions on File Prior to Visit  Medication Sig Dispense Refill  . amitriptyline (ELAVIL) 25 MG tablet Take 1 tablet (25 mg total) by mouth 3 (three) times daily. 90 tablet 1  . diazepam (VALIUM) 5 MG tablet Take 1 tablet (5 mg total) by mouth 2 (two) times daily. 60 tablet 1  . divalproex (DEPAKOTE ER) 500 MG 24 hr tablet Take 2 tablets (1,000 mg total) by mouth at bedtime. 60 tablet 1  . FLUoxetine (PROZAC) 20 MG tablet Take 3 tablets (60 mg total) by mouth daily. 90 tablet 1  . QUEtiapine (SEROQUEL) 200 MG tablet Take 1 tablet (200 mg total) by mouth at bedtime. 30 tablet 1  . QUEtiapine (SEROQUEL) 50 MG tablet Take 1 tablet (50 mg total) by mouth 2 (two) times daily. 60 tablet 1  . valACYclovir (VALTREX) 1000 MG tablet Take 1,000 mg by mouth daily.    Marland Kitchen zolpidem (AMBIEN) 10 MG tablet Take 1 tablet (10 mg total) by mouth at bedtime. 30 tablet 1   No current  facility-administered medications on file prior to visit.     Allergies  Allergen Reactions  . Nsaids Other (See Comments)    Contraindicated with gastric bypass    Family History  Problem Relation Age of Onset  . Diabetes Mother   . Cancer Mother 60    Throat   . Bipolar disorder Mother   . Bipolar disorder Sister   . Bipolar disorder Brother   . Drug abuse Brother   . Bipolar disorder Sister     Social History   Social History  . Marital status: Single    Spouse name: N/A  . Number of children: N/A  . Years of education: N/A   Occupational History  . 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 Smoking status: Current Every Day Smoker    Packs/day: 0.50    Years: 6.00    Last attempt to quit: 05/11/2014  . Smokeless tobacco: Never Used  . Alcohol use No  . Drug use: No  . Sexual activity: Yes    Partners: Male    Birth control/ protection: Surgical   Other Topics Concern  . None   Social History Narrative  . None   Review of Systems - See HPI.  All other ROS are negative.  BP 102/70   Pulse 89   Temp 98.5 F (36.9 C) (Oral)   Resp 16   Ht '5\' 4"'$  (1.626 m)  Wt 240 lb (108.9 kg)   SpO2 98%   BMI 41.20 kg/m   Physical Exam  Constitutional: She is oriented to person, place, and time and well-developed, well-nourished, and in no distress.  HENT:  Head: Normocephalic and atraumatic.  Right Ear: External ear normal.  Left Ear: External ear normal.  Nose: Nose normal.  Mouth/Throat: Oropharynx is clear and moist. No oropharyngeal exudate.  TM within normal limits bilaterally.  Eyes: Conjunctivae and EOM are normal. Pupils are equal, round, and reactive to light.  Neck: Neck supple.  Cardiovascular: Normal rate, regular rhythm, normal heart sounds and intact distal pulses.   Pulmonary/Chest: Effort normal and breath sounds normal. No respiratory distress. She has no wheezes. She has no rales. She exhibits no tenderness.  Musculoskeletal:  Normal range of motion.  Lymphadenopathy:    She has no cervical adenopathy.  Neurological: She is alert and oriented to person, place, and time. She has normal sensation and intact cranial nerves. She displays facial symmetry. She has an abnormal Tandem Gait Test. She has a normal Cerebellar Exam. She shows no pronator drift.  Reflex Scores:      Patellar reflexes are 2+ on the right side and 2+ on the left side.      Achilles reflexes are 2+ on the right side and 2+ on the left side. Resting tremor of both hands noted. Worsening with intention.  Mask facies noted on examination.   Skin: Skin is warm and dry. No rash noted.  Psychiatric: Affect normal.  Vitals reviewed.   Recent Results (from the past 2160 hour(s))  CBC with Differential/Platelet     Status: Abnormal   Collection Time: 05/26/16  1:17 PM  Result Value Ref Range   WBC 7.8 3.4 - 10.8 x10E3/uL   RBC 4.59 3.77 - 5.28 x10E6/uL   Hemoglobin 13.7 11.1 - 15.9 g/dL   Hematocrit 42.0 34.0 - 46.6 %   MCV 92 79 - 97 fL   MCH 29.8 26.6 - 33.0 pg   MCHC 32.6 31.5 - 35.7 g/dL   RDW 15.9 (H) 12.3 - 15.4 %   Platelets 265 150 - 379 x10E3/uL   Neutrophils 59 Not Estab. %   Lymphs 32 Not Estab. %   Monocytes 6 Not Estab. %   Eos 3 Not Estab. %   Basos 0 Not Estab. %   Neutrophils Absolute 4.6 1.4 - 7.0 x10E3/uL   Lymphocytes Absolute 2.5 0.7 - 3.1 x10E3/uL   Monocytes Absolute 0.5 0.1 - 0.9 x10E3/uL   EOS (ABSOLUTE) 0.2 0.0 - 0.4 x10E3/uL   Basophils Absolute 0.0 0.0 - 0.2 x10E3/uL   Immature Granulocytes 0 Not Estab. %   Immature Grans (Abs) 0.0 0.0 - 0.1 x10E3/uL  Comprehensive metabolic panel     Status: None   Collection Time: 05/26/16  1:17 PM  Result Value Ref Range   Glucose 77 65 - 99 mg/dL   BUN 9 6 - 24 mg/dL   Creatinine, Ser 0.61 0.57 - 1.00 mg/dL   GFR calc non Af Amer 111 >59 mL/min/1.73   GFR calc Af Amer 128 >59 mL/min/1.73   BUN/Creatinine Ratio 15 9 - 23   Sodium 140 134 - 144 mmol/L   Potassium 5.0  3.5 - 5.2 mmol/L   Chloride 102 96 - 106 mmol/L   CO2 23 18 - 29 mmol/L   Calcium 8.7 8.7 - 10.2 mg/dL   Total Protein 6.0 6.0 - 8.5 g/dL   Albumin 3.8 3.5 - 5.5 g/dL  Globulin, Total 2.2 1.5 - 4.5 g/dL   Albumin/Globulin Ratio 1.7 1.2 - 2.2   Bilirubin Total <0.2 0.0 - 1.2 mg/dL   Alkaline Phosphatase 55 39 - 117 IU/L   AST 17 0 - 40 IU/L   ALT 15 0 - 32 IU/L  Valproic acid level     Status: Abnormal   Collection Time: 05/26/16  1:17 PM  Result Value Ref Range   Valproic Acid Lvl 33 (L) 50 - 100 ug/mL    Comment:                                 Detection Limit = 4                            <4 indicates None Detected Toxicity may occur at levels of 100-500. Measurements of free unbound valproic acid may improve the assess- ment of clinical response.   CBC w/Diff     Status: None   Collection Time: 06/08/16  9:35 AM  Result Value Ref Range   WBC 6.3 4.0 - 10.5 K/uL   RBC 4.70 3.87 - 5.11 Mil/uL   Hemoglobin 14.3 12.0 - 15.0 g/dL   HCT 42.0 36.0 - 46.0 %   MCV 89.4 78.0 - 100.0 fl   MCHC 34.0 30.0 - 36.0 g/dL   RDW 15.4 11.5 - 15.5 %   Platelets 265.0 150.0 - 400.0 K/uL   Neutrophils Relative % 62.3 43.0 - 77.0 %   Lymphocytes Relative 29.0 12.0 - 46.0 %   Monocytes Relative 5.4 3.0 - 12.0 %   Eosinophils Relative 3.0 0.0 - 5.0 %   Basophils Relative 0.3 0.0 - 3.0 %   Neutro Abs 3.9 1.4 - 7.7 K/uL   Lymphs Abs 1.8 0.7 - 4.0 K/uL   Monocytes Absolute 0.3 0.1 - 1.0 K/uL   Eosinophils Absolute 0.2 0.0 - 0.7 K/uL   Basophils Absolute 0.0 0.0 - 0.1 K/uL  Comp Met (CMET)     Status: None   Collection Time: 06/08/16  9:35 AM  Result Value Ref Range   Sodium 139 135 - 145 mEq/L   Potassium 4.4 3.5 - 5.1 mEq/L   Chloride 108 96 - 112 mEq/L   CO2 27 19 - 32 mEq/L   Glucose, Bld 78 70 - 99 mg/dL   BUN 14 6 - 23 mg/dL   Creatinine, Ser 0.65 0.40 - 1.20 mg/dL   Total Bilirubin 0.2 0.2 - 1.2 mg/dL   Alkaline Phosphatase 51 39 - 117 U/L   AST 18 0 - 37 U/L   ALT 17 0 - 35 U/L    Total Protein 6.0 6.0 - 8.3 g/dL   Albumin 3.8 3.5 - 5.2 g/dL   Calcium 9.1 8.4 - 10.5 mg/dL   GFR 105.43 >60.00 mL/min  TSH     Status: None   Collection Time: 06/08/16  9:35 AM  Result Value Ref Range   TSH 1.22 0.35 - 4.50 uIU/mL  T4, free     Status: None   Collection Time: 06/08/16  9:35 AM  Result Value Ref Range   Free T4 0.84 0.60 - 1.60 ng/dL    Comment: Specimens from patients who are undergoing biotin therapy and /or ingesting biotin supplements may contain high levels of biotin.  The higher biotin concentration in these specimens interferes with this Free T4 assay.  Specimens that  contain high levels  of biotin may cause false high results for this Free T4 assay.  Please interpret results in light of the total clinical presentation of the patient.      Assessment/Plan: 1. Parkinsonian features Tremor, masked facie. Also abnormal tandem walking. Concern psychotropic regimen is contributing to this. Lab panel today. Will have her follow-up this week with Psychiatry. Urgent neurology referral placed for further assessment.  - CBC w/Diff - Comp Met (CMET) - TSH - T4, free - Ambulatory referral to Neurology - Ceruloplasmin  2. Paresthesia and pain of both upper extremities Labs today to further assess. Urgent Neurology referral placed giving constellation of symptoms.  - Ambulatory referral to Neurology - Ceruloplasmin   Leeanne Rio, PA-C

## 2016-06-12 ENCOUNTER — Other Ambulatory Visit: Payer: Self-pay | Admitting: Physician Assistant

## 2016-06-12 DIAGNOSIS — R259 Unspecified abnormal involuntary movements: Secondary | ICD-10-CM

## 2016-06-12 LAB — CERULOPLASMIN: CERULOPLASMIN: 28 mg/dL (ref 18–53)

## 2016-06-14 ENCOUNTER — Encounter (HOSPITAL_COMMUNITY): Payer: Self-pay

## 2016-06-14 ENCOUNTER — Encounter (HOSPITAL_COMMUNITY): Payer: Self-pay | Admitting: Psychiatry

## 2016-06-14 ENCOUNTER — Ambulatory Visit (INDEPENDENT_AMBULATORY_CARE_PROVIDER_SITE_OTHER): Payer: 59 | Admitting: Psychiatry

## 2016-06-14 DIAGNOSIS — Z9884 Bariatric surgery status: Secondary | ICD-10-CM

## 2016-06-14 DIAGNOSIS — F3131 Bipolar disorder, current episode depressed, mild: Secondary | ICD-10-CM

## 2016-06-14 DIAGNOSIS — Z801 Family history of malignant neoplasm of trachea, bronchus and lung: Secondary | ICD-10-CM

## 2016-06-14 DIAGNOSIS — Z79899 Other long term (current) drug therapy: Secondary | ICD-10-CM

## 2016-06-14 DIAGNOSIS — Z888 Allergy status to other drugs, medicaments and biological substances status: Secondary | ICD-10-CM

## 2016-06-14 DIAGNOSIS — Z818 Family history of other mental and behavioral disorders: Secondary | ICD-10-CM

## 2016-06-14 DIAGNOSIS — F17210 Nicotine dependence, cigarettes, uncomplicated: Secondary | ICD-10-CM

## 2016-06-14 DIAGNOSIS — Z9889 Other specified postprocedural states: Secondary | ICD-10-CM | POA: Diagnosis not present

## 2016-06-14 DIAGNOSIS — F411 Generalized anxiety disorder: Secondary | ICD-10-CM | POA: Diagnosis not present

## 2016-06-14 DIAGNOSIS — Z9851 Tubal ligation status: Secondary | ICD-10-CM | POA: Diagnosis not present

## 2016-06-14 DIAGNOSIS — Z833 Family history of diabetes mellitus: Secondary | ICD-10-CM

## 2016-06-14 DIAGNOSIS — Z813 Family history of other psychoactive substance abuse and dependence: Secondary | ICD-10-CM

## 2016-06-14 MED ORDER — QUETIAPINE FUMARATE 200 MG PO TABS: 200.0000 mg | ORAL_TABLET | Freq: Every day | ORAL | 1 refills | Status: DC

## 2016-06-14 MED ORDER — ZOLPIDEM TARTRATE 10 MG PO TABS: 10.0000 mg | ORAL_TABLET | Freq: Every day | ORAL | 0 refills | Status: DC

## 2016-06-14 MED ORDER — AMITRIPTYLINE HCL 25 MG PO TABS: 25.0000 mg | ORAL_TABLET | Freq: Three times a day (TID) | ORAL | 1 refills | Status: DC

## 2016-06-14 MED ORDER — QUETIAPINE FUMARATE 50 MG PO TABS: 50.0000 mg | ORAL_TABLET | Freq: Two times a day (BID) | ORAL | 1 refills | Status: DC

## 2016-06-14 MED ORDER — DIAZEPAM 2 MG PO TABS: 2.0000 mg | ORAL_TABLET | Freq: Two times a day (BID) | ORAL | 0 refills | Status: DC | PRN

## 2016-06-14 NOTE — Patient Instructions (Signed)
1 Continue Depakote 1000 mg at night 2. Decrease Valium 2mg  twice a day 3. Continue fluoxetine 60 mg daily 4. Continue Elavil 25 mg three times a day 5. Continue quetiapine 50 mg twice a day and 200 mg at night 6. Continue Ambien 10 mg at night 7. Return to clinic in one month
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
## 2016-07-03 ENCOUNTER — Ambulatory Visit: Payer: Self-pay | Admitting: Neurology

## 2016-07-03 ENCOUNTER — Other Ambulatory Visit (HOSPITAL_COMMUNITY): Payer: Self-pay

## 2016-07-03 MED ORDER — DIVALPROEX SODIUM ER 500 MG PO TB24: 1000.0000 mg | ORAL_TABLET | Freq: Every day | ORAL | 0 refills | Status: DC

## 2016-07-03 MED FILL — diazePAM 2 MG TABS: 2 | 30 days supply | Qty: 60 | Fill #0

## 2016-07-03 MED FILL — FLUoxetine HCL 20 MG TABS: 20 | 30 days supply | Qty: 90 | Fill #1

## 2016-07-03 MED FILL — DIVALPROEX SOD ER 500 MG TA: 500 | 90 days supply | Qty: 180 | Fill #0

## 2016-07-03 MED FILL — ZOLPIDEM TARTRATE 10 MG TAB: 10 | 30 days supply | Qty: 30 | Fill #0

## 2016-07-03 MED FILL — AMITRIPTYLINE HCL 25 MG TAB: 25 | 30 days supply | Qty: 90 | Fill #1

## 2016-07-04 MED FILL — QUETIAPINE FUMARATE 200 MG: 200 | 30 days supply | Qty: 30 | Fill #0

## 2016-07-04 NOTE — Progress Notes (Deleted)
Ashley Franklin was seen today in the movement disorders clinic for neurologic consultation at the request of Piedad Climes, PA-C.  The consultation is for the evaluation of parkinsonism.  Pt is being seen by psychiatry for bipolar affective disorder and is on numerous medications for this.  She is on Depakote, 1000 mg daily.  She is on quetiapine, ***50 mg in the morning and 250 mg at night.  She is also on amitriptyline 25 mg TID,  Fluoxetine 60 mg QD, Valium 5 mg BID.  Malva Cogan told the patient to see me as well as her psychiatrist.  I reviewed psychiatry records from 06/14/2016.  She did mention tremor, but according to notes she did not want to change medication because she felt well on Depakote, understanding that this could be the source of tremor.   Specific Symptoms:  Tremor: {yes no:314532} Family hx of similar:  {yes no:314532} Voice: *** Sleep: ***  Vivid Dreams:  {yes no:314532}  Acting out dreams:  {yes no:314532} Wet Pillows: {yes no:314532} Postural symptoms:  {yes no:314532}  Falls?  {yes no:314532} Bradykinesia symptoms: {parkinson brady:18041} Loss of smell:  {yes no:314532} Loss of taste:  {yes no:314532} Urinary Incontinence:  {yes no:314532} Difficulty Swallowing:  {yes no:314532} Handwriting, micrographia: {yes no:314532} Trouble with ADL's:  {yes no:314532}  Trouble buttoning clothing: {yes no:314532} Depression:  {yes no:314532} Memory changes:  {yes no:314532} Hallucinations:  {yes no:314532}  visual distortions: {yes no:314532} N/V:  {yes no:314532} Lightheaded:  {yes no:314532}  Syncope: {yes no:314532} Diplopia:  {yes no:314532} Dyskinesia:  {yes no:314532}  Neuroimaging has *** previously been performed.  It *** available for my review today.  PREVIOUS MEDICATIONS: {Parkinson's RX:18200}  ALLERGIES:   Allergies  Allergen Reactions  . Nsaids Other (See Comments)    Contraindicated with gastric bypass    CURRENT MEDICATIONS:    Outpatient Encounter Prescriptions as of 07/06/2016  Medication Sig  . amitriptyline (ELAVIL) 25 MG tablet Take 1 tablet (25 mg total) by mouth 3 (three) times daily.  . Biotin 40981 MCG TABS Take 1 tablet by mouth daily.  . diazepam (VALIUM) 2 MG tablet Take 1 tablet (2 mg total) by mouth 2 (two) times daily as needed for anxiety.  . divalproex (DEPAKOTE ER) 500 MG 24 hr tablet Take 2 tablets (1,000 mg total) by mouth at bedtime.  Marland Kitchen FLUoxetine (PROZAC) 20 MG tablet Take 3 tablets (60 mg total) by mouth daily.  . IRON PO Take 1 tablet by mouth daily.  . QUEtiapine (SEROQUEL) 200 MG tablet Take 1 tablet (200 mg total) by mouth at bedtime.  Marland Kitchen QUEtiapine (SEROQUEL) 50 MG tablet Take 1 tablet (50 mg total) by mouth 2 (two) times daily.  . valACYclovir (VALTREX) 1000 MG tablet Take 1,000 mg by mouth daily.  Marland Kitchen zolpidem (AMBIEN) 10 MG tablet Take 1 tablet (10 mg total) by mouth at bedtime.   No facility-administered encounter medications on file as of 07/06/2016.     PAST MEDICAL HISTORY:   Past Medical History:  Diagnosis Date  . Anemia   . Anxiety   . Bipolar affective disorder (HCC)   . Blood transfusion without reported diagnosis   . Depression   . Kidney stone   . SBO (small bowel obstruction) 01/2014    PAST SURGICAL HISTORY:   Past Surgical History:  Procedure Laterality Date  . CESAREAN SECTION    . GASTRIC BYPASS  2001  . TUBAL LIGATION    . uterine ablasion  SOCIAL HISTORY:   Social History   Social History  . Marital status: Single    Spouse name: N/A  . Number of children: N/A  . 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 Smoking status: Current Every Day Smoker    Packs/day: 0.50    Years: 6.00    Last attempt to quit: 05/11/2014  . Smokeless tobacco: Never Used  . Alcohol use No  . Drug use: No  . Sexual activity: Yes    Partners: Male    Birth control/ protection: Surgical   Other  Topics Concern  . Not on file   Social History Narrative  . No narrative on file    FAMILY HISTORY:   Family Status  Relation Status  . Mother Alive  . Father Alive  . Sister Alive  . Brother Alive  . Sister Alive    ROS:  A complete 10 system review of systems was obtained and was unremarkable apart from what is mentioned above.  PHYSICAL EXAMINATION:    VITALS:  There were no vitals filed for this visit.  GEN:  The patient appears stated age and is in NAD. HEENT:  Normocephalic, atraumatic.  The mucous membranes are moist. The superficial temporal arteries are without ropiness or tenderness. CV:  RRR Lungs:  CTAB Neck/HEME:  There are no carotid bruits bilaterally.  Neurological examination:  Orientation: The patient is alert and oriented x3. Fund of knowledge is appropriate.  Recent and remote memory are intact.  Attention and concentration are normal.    Able to name objects and repeat phrases. Cranial nerves: There is good facial symmetry. Pupils are equal round and reactive to light bilaterally. Fundoscopic exam reveals clear margins bilaterally. Extraocular muscles are intact. The visual fields are full to confrontational testing. The speech is fluent and clear. Soft palate rises symmetrically and there is no tongue deviation. Hearing is intact to conversational tone. Sensation: Sensation is intact to light and pinprick throughout (facial, trunk, extremities). Vibration is intact at the bilateral big toe. There is no extinction with double simultaneous stimulation. There is no sensory dermatomal level identified. Motor: Strength is 5/5 in the bilateral upper and lower extremities.   Shoulder shrug is equal and symmetric.  There is no pronator drift. Deep tendon reflexes: Deep tendon reflexes are 2/4 at the bilateral biceps, triceps, brachioradialis, patella and achilles. Plantar responses are downgoing bilaterally.  Movement examination: Tone: There is ***tone in the  bilateral upper extremities.  The tone in the lower extremities is ***.  Abnormal movements: *** Coordination:  There is *** decremation with [name]'s, *** Gait and Station: The patient has *** difficulty arising out of a deep-seated chair without the use of the hands. The patient's stride length is ***.  The patient has a *** pull test.      ASSESSMENT/PLAN:  ***Parkinsonism  -I had a long counseling session with the patient today.  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  I explained that one clinically cannot tell the difference between idiopathic parkinsons disease.  I also explained that even if one is able to get off of the medication, it can take up to 6 months to clinically definitively know if this is idiopathic parkinsons disease.  I did not advise that the patient go off of medication, as this needs to be discussed with the patients prescribing physician.  I did, however,  tell the patient that the longer one is on the medication, the worse the symptoms can get.  The patient is to make an appointment with *** to discuss what I have discussed with her.  -Explained to the patient that in addition to Seroquel, Depakote can also cause a fine tremor that she is describing.  Cc:  Piedad ClimesMartin, William Cody, PA-C

## 2016-07-05 DIAGNOSIS — R413 Other amnesia: Secondary | ICD-10-CM | POA: Diagnosis not present

## 2016-07-05 DIAGNOSIS — G252 Other specified forms of tremor: Secondary | ICD-10-CM | POA: Diagnosis not present

## 2016-07-05 DIAGNOSIS — H538 Other visual disturbances: Secondary | ICD-10-CM | POA: Diagnosis not present

## 2016-07-05 DIAGNOSIS — R4789 Other speech disturbances: Secondary | ICD-10-CM | POA: Diagnosis not present

## 2016-07-05 DIAGNOSIS — R404 Transient alteration of awareness: Secondary | ICD-10-CM | POA: Diagnosis not present

## 2016-07-05 DIAGNOSIS — R480 Dyslexia and alexia: Secondary | ICD-10-CM | POA: Diagnosis not present

## 2016-07-05 DIAGNOSIS — M62838 Other muscle spasm: Secondary | ICD-10-CM | POA: Diagnosis not present

## 2016-07-05 DIAGNOSIS — R208 Other disturbances of skin sensation: Secondary | ICD-10-CM | POA: Diagnosis not present

## 2016-07-06 ENCOUNTER — Ambulatory Visit: Payer: Self-pay | Admitting: Neurology

## 2016-07-12 DIAGNOSIS — H538 Other visual disturbances: Secondary | ICD-10-CM | POA: Diagnosis not present

## 2016-07-12 DIAGNOSIS — R404 Transient alteration of awareness: Secondary | ICD-10-CM | POA: Diagnosis not present

## 2016-07-19 ENCOUNTER — Ambulatory Visit (HOSPITAL_COMMUNITY): Payer: Self-pay | Admitting: Psychiatry

## 2016-07-19 DIAGNOSIS — G252 Other specified forms of tremor: Secondary | ICD-10-CM | POA: Diagnosis not present

## 2016-07-20 MED FILL — QUETIAPINE FUMARATE 50 MG T: 50 | 30 days supply | Qty: 60 | Fill #0
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
## 2016-08-03 ENCOUNTER — Ambulatory Visit (INDEPENDENT_AMBULATORY_CARE_PROVIDER_SITE_OTHER): Payer: 59 | Admitting: Psychiatry

## 2016-08-03 VITALS — BP 108/73 | HR 96 | Wt 233.4 lb

## 2016-08-03 DIAGNOSIS — F17210 Nicotine dependence, cigarettes, uncomplicated: Secondary | ICD-10-CM | POA: Diagnosis not present

## 2016-08-03 DIAGNOSIS — Z888 Allergy status to other drugs, medicaments and biological substances status: Secondary | ICD-10-CM

## 2016-08-03 DIAGNOSIS — Z818 Family history of other mental and behavioral disorders: Secondary | ICD-10-CM

## 2016-08-03 DIAGNOSIS — Z813 Family history of other psychoactive substance abuse and dependence: Secondary | ICD-10-CM

## 2016-08-03 DIAGNOSIS — F3131 Bipolar disorder, current episode depressed, mild: Secondary | ICD-10-CM

## 2016-08-03 DIAGNOSIS — F411 Generalized anxiety disorder: Secondary | ICD-10-CM | POA: Diagnosis not present

## 2016-08-03 DIAGNOSIS — F3162 Bipolar disorder, current episode mixed, moderate: Secondary | ICD-10-CM

## 2016-08-03 DIAGNOSIS — Z79899 Other long term (current) drug therapy: Secondary | ICD-10-CM | POA: Diagnosis not present

## 2016-08-03 MED ORDER — QUETIAPINE FUMARATE 200 MG PO TABS: 200.0000 mg | ORAL_TABLET | Freq: Every day | ORAL | 1 refills | Status: DC

## 2016-08-03 MED ORDER — AMITRIPTYLINE HCL 25 MG PO TABS: 25.0000 mg | ORAL_TABLET | Freq: Two times a day (BID) | ORAL | 1 refills | Status: DC

## 2016-08-03 MED ORDER — DIAZEPAM 2 MG PO TABS: 2.0000 mg | ORAL_TABLET | Freq: Two times a day (BID) | ORAL | 0 refills | Status: DC | PRN

## 2016-08-03 MED ORDER — ZOLPIDEM TARTRATE 5 MG PO TABS: 5.0000 mg | ORAL_TABLET | Freq: Every day | ORAL | 0 refills | Status: DC

## 2016-08-03 MED ORDER — DIVALPROEX SODIUM ER 500 MG PO TB24: 1000.0000 mg | ORAL_TABLET | Freq: Every day | ORAL | 0 refills | Status: DC

## 2016-08-03 MED ORDER — FLUOXETINE HCL 20 MG PO TABS: 60.0000 mg | ORAL_TABLET | Freq: Every day | ORAL | 1 refills | Status: DC

## 2016-08-03 MED ORDER — QUETIAPINE FUMARATE 100 MG PO TABS: 100.0000 mg | ORAL_TABLET | Freq: Two times a day (BID) | ORAL | 1 refills | Status: DC

## 2016-08-03 MED FILL — ZOLPIDEM TARTRATE 5 MG TAB: 5 | 30 days supply | Qty: 30 | Fill #0

## 2016-08-03 MED FILL — QUETIAPINE FUMARATE 200 MG: 200 | 30 days supply | Qty: 30 | Fill #0

## 2016-08-03 MED FILL — QUETIAPINE FUMARATE 100 MG: 100 | 30 days supply | Qty: 60 | Fill #0

## 2016-08-03 MED FILL — diazePAM 2 MG TABS: 2 | 30 days supply | Qty: 60 | Fill #0

## 2016-08-03 MED FILL — AMITRIPTYLINE HCL 25 MG TAB: 25 | 30 days supply | Qty: 60 | Fill #0

## 2016-08-03 MED FILL — FLUoxetine HCL 20 MG TABS: 20 | 30 days supply | Qty: 90 | Fill #0

## 2016-08-03 NOTE — Patient Instructions (Addendum)
1 Continue Depakote 1000 mg at night  2. Continue Valium 2mg  twice a day as needed for anxiety 3. Continue fluoxetine 60 mg daily 4. Decrease Elavil 25 mg twice a day 5. Increase quetiapine 100 mg twice a day and 200 mg at night 6. Decrease Ambien 5 mg at night 7. Return to clinic in one month for 30 minutes 8. Contact [place] office for therapy
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
## 2016-08-16 DIAGNOSIS — Z0120 Encounter for dental examination and cleaning without abnormal findings: Secondary | ICD-10-CM | POA: Diagnosis not present

## 2016-08-28 MED FILL — QUETIAPINE FUMARATE 200 MG: 200 | 30 days supply | Qty: 30 | Fill #1
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
## 2016-08-29 ENCOUNTER — Encounter (HOSPITAL_COMMUNITY): Payer: Self-pay | Admitting: Psychiatry

## 2016-08-29 ENCOUNTER — Ambulatory Visit (INDEPENDENT_AMBULATORY_CARE_PROVIDER_SITE_OTHER): Payer: 59 | Admitting: Psychiatry

## 2016-08-29 VITALS — BP 101/65 | HR 81 | Ht 64.0 in | Wt 236.6 lb

## 2016-08-29 DIAGNOSIS — Z813 Family history of other psychoactive substance abuse and dependence: Secondary | ICD-10-CM | POA: Diagnosis not present

## 2016-08-29 DIAGNOSIS — F17210 Nicotine dependence, cigarettes, uncomplicated: Secondary | ICD-10-CM

## 2016-08-29 DIAGNOSIS — Z79899 Other long term (current) drug therapy: Secondary | ICD-10-CM

## 2016-08-29 DIAGNOSIS — F3162 Bipolar disorder, current episode mixed, moderate: Secondary | ICD-10-CM

## 2016-08-29 DIAGNOSIS — F3131 Bipolar disorder, current episode depressed, mild: Secondary | ICD-10-CM

## 2016-08-29 DIAGNOSIS — Z818 Family history of other mental and behavioral disorders: Secondary | ICD-10-CM | POA: Diagnosis not present

## 2016-08-29 MED ORDER — QUETIAPINE FUMARATE 100 MG PO TABS: 100.0000 mg | ORAL_TABLET | Freq: Two times a day (BID) | ORAL | 1 refills | Status: DC

## 2016-08-29 MED ORDER — QUETIAPINE FUMARATE 200 MG PO TABS: 200.0000 mg | ORAL_TABLET | Freq: Every day | ORAL | 1 refills | Status: DC

## 2016-08-29 MED ORDER — LORAZEPAM 1 MG PO TABS: 1.0000 mg | ORAL_TABLET | Freq: Three times a day (TID) | ORAL | 0 refills | Status: DC | PRN

## 2016-08-29 MED ORDER — ZOLPIDEM TARTRATE 5 MG PO TABS: 5.0000 mg | ORAL_TABLET | Freq: Every day | ORAL | 0 refills | Status: DC

## 2016-08-29 MED ORDER — LAMOTRIGINE 25 MG PO TABS: ORAL_TABLET | ORAL | 1 refills | Status: DC

## 2016-08-29 MED ORDER — DIVALPROEX SODIUM ER 500 MG PO TB24: 1000.0000 mg | ORAL_TABLET | Freq: Every day | ORAL | 0 refills | Status: DC

## 2016-08-29 MED FILL — FLUoxetine HCL 20 MG TABS: 20 | 30 days supply | Qty: 90 | Fill #1

## 2016-08-29 MED FILL — lamoTRIgine 25 MG TABS: 25 | 37 days supply | Qty: 30 | Fill #0

## 2016-08-29 MED FILL — diazePAM 5 MG TABS: 5 | 30 days supply | Qty: 60 | Fill #1

## 2016-08-29 MED FILL — QUETIAPINE FUMARATE 100 MG: 100 | 30 days supply | Qty: 60 | Fill #0

## 2016-08-29 MED FILL — LORazepam 1 MG TABS: 1 | 30 days supply | Qty: 90 | Fill #0

## 2016-08-29 NOTE — Patient Instructions (Addendum)
1  Continue Depakote 1000 mg at night 2. Continue fluoxetine 60 mg daily 3. Discontinue Elavil 4. Continue quetiapine 100 mg twice a day and 200 mg at night 5. Start lamotrigine 25 mg every other day for two weeks, then 25 mg daily 6. Discontinue Valium 7. Start Ativan 1 mg three times a day as needed for anxiety, irritability 8. Continue Ambien 5 mg at night 9. Return to clinic in 3 weeks for 30 minutes 10. Contact [place] office for therapy

## 2016-08-31 MED FILL — ZOLPIDEM TARTRATE 5 MG TAB: 5 | 30 days supply | Qty: 30 | Fill #0

## 2016-09-01 ENCOUNTER — Ambulatory Visit (HOSPITAL_COMMUNITY): Payer: Self-pay | Admitting: Psychiatry
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
## 2016-09-18 ENCOUNTER — Encounter (HOSPITAL_COMMUNITY): Payer: Self-pay | Admitting: Psychiatry

## 2016-09-18 ENCOUNTER — Ambulatory Visit (INDEPENDENT_AMBULATORY_CARE_PROVIDER_SITE_OTHER): Payer: 59 | Admitting: Psychiatry

## 2016-09-18 DIAGNOSIS — F17210 Nicotine dependence, cigarettes, uncomplicated: Secondary | ICD-10-CM | POA: Diagnosis not present

## 2016-09-18 DIAGNOSIS — F3131 Bipolar disorder, current episode depressed, mild: Secondary | ICD-10-CM

## 2016-09-18 DIAGNOSIS — Z79899 Other long term (current) drug therapy: Secondary | ICD-10-CM | POA: Diagnosis not present

## 2016-09-18 DIAGNOSIS — E663 Overweight: Secondary | ICD-10-CM | POA: Diagnosis not present

## 2016-09-18 DIAGNOSIS — Z818 Family history of other mental and behavioral disorders: Secondary | ICD-10-CM

## 2016-09-18 DIAGNOSIS — D649 Anemia, unspecified: Secondary | ICD-10-CM

## 2016-09-18 DIAGNOSIS — Z9884 Bariatric surgery status: Secondary | ICD-10-CM | POA: Diagnosis not present

## 2016-09-18 DIAGNOSIS — Z6841 Body Mass Index (BMI) 40.0 and over, adult: Secondary | ICD-10-CM

## 2016-09-18 DIAGNOSIS — Z813 Family history of other psychoactive substance abuse and dependence: Secondary | ICD-10-CM

## 2016-09-18 MED ORDER — LAMOTRIGINE 25 MG PO TABS: 25.0000 mg | ORAL_TABLET | Freq: Every day | ORAL | 1 refills | Status: DC

## 2016-09-18 MED ORDER — QUETIAPINE FUMARATE 200 MG PO TABS: 200.0000 mg | ORAL_TABLET | Freq: Every day | ORAL | 1 refills | Status: DC

## 2016-09-18 MED ORDER — LORAZEPAM 1 MG PO TABS: 1.0000 mg | ORAL_TABLET | Freq: Three times a day (TID) | ORAL | 0 refills | Status: DC | PRN

## 2016-09-18 MED ORDER — ZOLPIDEM TARTRATE 5 MG PO TABS: 5.0000 mg | ORAL_TABLET | Freq: Every day | ORAL | 0 refills | Status: DC

## 2016-09-18 MED ORDER — ZOLPIDEM TARTRATE 5 MG PO TABS: 5.0000 mg | ORAL_TABLET | Freq: Every day | ORAL | 1 refills | Status: DC

## 2016-09-18 MED ORDER — LORAZEPAM 1 MG PO TABS: 1.0000 mg | ORAL_TABLET | Freq: Three times a day (TID) | ORAL | 1 refills | Status: DC | PRN

## 2016-09-18 MED ORDER — FLUOXETINE HCL 20 MG PO TABS: 80.0000 mg | ORAL_TABLET | Freq: Every day | ORAL | 1 refills | Status: DC

## 2016-09-18 MED ORDER — DIVALPROEX SODIUM ER 250 MG PO TB24: 1250.0000 mg | ORAL_TABLET | Freq: Every day | ORAL | 1 refills | Status: DC

## 2016-09-18 MED ORDER — QUETIAPINE FUMARATE 100 MG PO TABS: 100.0000 mg | ORAL_TABLET | Freq: Two times a day (BID) | ORAL | 1 refills | Status: DC

## 2016-09-18 MED FILL — DIVALPROEX SOD ER 250 MG TA: 250 | 30 days supply | Qty: 150 | Fill #0

## 2016-09-18 NOTE — Patient Instructions (Signed)
1  Increase Depakote 1250 mg at night 2. Obtain blood test  3. Increase fluoxetine 80 mg daily 4. Continue quetiapine 100 mg twice a day and 200 mg at night 5. Continue lamotrigine 25 mg daily 6. Continue Ativan 1 mg three times a day as needed for anxiety, irritability 7. Continue Ambien 5 mg at night 8. Return to clinic in 2 months for 30 minutes

## 2016-09-27 MED FILL — LORazepam 1 MG TABS: 1 | 30 days supply | Qty: 90 | Fill #0

## 2016-09-27 MED FILL — FLUoxetine HCL 20 MG TABS: 20 | 30 days supply | Qty: 120 | Fill #0

## 2016-09-27 MED FILL — QUETIAPINE FUMARATE 100 MG: 100 | 30 days supply | Qty: 60 | Fill #1

## 2016-09-28 MED FILL — lamoTRIgine 25 MG TABS: 25 | 30 days supply | Qty: 30 | Fill #0

## 2016-09-28 MED FILL — ZOLPIDEM TARTRATE 5 MG TAB: 5 | 30 days supply | Qty: 30 | Fill #0

## 2016-10-05 DIAGNOSIS — Z0120 Encounter for dental examination and cleaning without abnormal findings: Secondary | ICD-10-CM | POA: Diagnosis not present

## 2016-10-12 DIAGNOSIS — Z0120 Encounter for dental examination and cleaning without abnormal findings: Secondary | ICD-10-CM | POA: Diagnosis not present

## 2016-10-18 MED FILL — DIVALPROEX SOD ER 250 MG TA: 250 | 4 days supply | Qty: 20 | Fill #1

## 2016-10-25 ENCOUNTER — Telehealth (HOSPITAL_COMMUNITY): Payer: Self-pay | Admitting: *Deleted

## 2016-10-25 NOTE — Telephone Encounter (Signed)
voice message, please call pharmacy regarding refill request for patient.

## 2016-10-25 NOTE — Telephone Encounter (Signed)
Pharmacy was called back [phone] [facility] they have rx but they have to fix something on there end.  They have to fix.

## 2016-10-27 MED FILL — FLUoxetine HCL 20 MG TABS: 20 | 29 days supply | Qty: 116 | Fill #1

## 2016-10-27 MED FILL — ZOLPIDEM TARTRATE 5 MG TAB: 5 | 29 days supply | Qty: 29 | Fill #1

## 2016-10-27 MED FILL — DIVALPROEX SOD ER 250 MG TA: 250 | 30 days supply | Qty: 150 | Fill #0

## 2016-10-27 MED FILL — LORazepam 1 MG TABS: 1 | 29 days supply | Qty: 87 | Fill #1

## 2016-10-27 MED FILL — lamoTRIgine 25 MG TABS: 25 | 29 days supply | Qty: 29 | Fill #1

## 2016-10-30 ENCOUNTER — Telehealth (HOSPITAL_COMMUNITY): Payer: Self-pay | Admitting: *Deleted

## 2016-10-30 NOTE — Telephone Encounter (Signed)
Pt pharmacy requesting clarification for pt Quetiapine. Per pharmacy fax, pt informed them that she is taking a higher dose and they received Quetiapine 100 mg by mouth twice a day. Per pt chart, directions stated 100 mg for 2 days and then 200 mg QHS. Per pt pharmacy, if pt is suppose to have something other then what they have for provider to resend the correct dose and SIG to them. [facility] and their number is [phone].

## 2016-10-30 NOTE — Telephone Encounter (Signed)
Please contact the pharmacy; I sent orders in May for two prescription: quetiapine 100 mg BID and 200 mg qhs (100 mg tabs and 200 mg tabs)

## 2016-10-31 MED FILL — QUETIAPINE FUMARATE 200 MG: 200 | 30 days supply | Qty: 30 | Fill #0

## 2016-10-31 MED FILL — QUETIAPINE FUMARATE 100 MG: 100 | 30 days supply | Qty: 60 | Fill #0

## 2016-10-31 NOTE — Telephone Encounter (Signed)
[facility] stated they do have those two script and will get them filled for pt. Read [facility]bour[facility] Dr. Vanetta ShawlHisada instructions on how pt is to take her Quetiapine. Tashawn verbalized understanding.

## 2016-11-01 NOTE — Telephone Encounter (Signed)
closed

## 2016-11-02 MED FILL — IBUPROFEN 800 MG TABLET: 800 | 7 days supply | Qty: 30 | Fill #0

## 2016-11-02 MED FILL — traMADol HCL 50 MG TABS: 50 | 2 days supply | Qty: 20 | Fill #0

## 2016-11-07 MED FILL — traMADol HCL 50 MG TABS: 50 | 2 days supply | Qty: 20 | Fill #0
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
## 2016-11-09 MED FILL — CLINDAMYCIN HCL 150 MG CAPS: 150 | 7 days supply | Qty: 56 | Fill #0

## 2016-11-09 MED FILL — VALACYCLOVIR HCL 500 MG TAB: 500 | 1 days supply | Qty: 8 | Fill #0

## 2016-11-13 NOTE — Progress Notes (Signed)
BH MD/PA/NP OP Progress Note  11/14/2016 11:58 AM Ashley Franklin  MRN:  [id number]  Chief Complaint:  Chief Complaint    Follow-up; Depression     Subjective:  "I'm doing exceptionally good" HPI:  Patient presents for follow up appointment for bipolar disorder. Patient apologizes for being somnolent this morning after she took her pain medication. She had a tooth surgery for abscess and needed to pull almost all of her tooth. She has been doing "exceptionally good" since the last appointment. She felt less irritable and started to reach out to her family member, which she has not done for many years. She celebrated father's day with her father and siblings. She had a joint birthday party with her husband. She felt validated by a boss at work and denies concern about her job. She talks about concern about her daughter, who left the house after stealing $ 13,000 from the patient.   She reports insomnia with night time awakening due to her husband makes noises. She reports good appetite and lost her weight due to diet changes. She denies fatigue. She has some mood swings (normal to down), although it has become significantly less since the last appointment. She denies SI, HI, AH/VH. She feels anxious at times and takes ativan a few times per week. She reports improvement in tremor.   Per [employer],  Patient is on ativan, Ambien, tramadol. No evidence of overprescription.   Wt Readings from Last 3 Encounters:  11/14/16 225 lb (102.1 kg)  09/18/16 239 lb (108.4 kg)  08/29/16 236 lb 9.6 oz (107.3 kg)    Visit Diagnosis:    ICD-10-CM   1. Bipolar affective disorder, currently depressed, mild (HCC) F31.31 zolpidem (AMBIEN) 5 MG tablet    QUEtiapine (SEROQUEL) 200 MG tablet    FLUoxetine (PROZAC) 20 MG tablet    Past Psychiatric History:  I have reviewed the patient's psychiatry history in detail and updated the patient record.  Out[facility]: [facility]  [facility]. Previously she was seen at [facility] in [place].  She was diagnosed with borderline personality disorder. She takes antidepressant since 2005.  Psychiatry admission: one admission in March 2015 for depression and SI Previous suicide attempt: denies Past trials of medication: Lexapro, Paxil, amitriptyline, Abilify (irritable), oxcarbazepine, perphenazine, Trifluoperazine, Klonopin, Valium. Vistaril,  History of violence: denies  Past Medical History:  Past Medical History:  Diagnosis Date  . Anemia   . Anxiety   . Bipolar affective disorder (HCC)   . Blood transfusion without reported diagnosis   . Depression   . Kidney stone   . SBO (small bowel obstruction) (HCC) 01/2014    Past Surgical History:  Procedure Laterality Date  . CESAREAN SECTION    . GASTRIC BYPASS  2001  . TUBAL LIGATION    . uterine ablasion      Family Psychiatric History:  I have reviewed the patient's family history in detail and updated the patient record.  Family History:  Family History  Problem Relation Age of Onset  . Diabetes Mother   . Cancer Mother 60       Throat   . Bipolar disorder Mother   . Bipolar disorder Sister   . Bipolar disorder Brother   . Drug abuse Brother   . Bipolar disorder Sister     Social History:  Social History   Social History  . Marital status: Single    Spouse name: N/A  .  Number of children: N/A  . 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 Smoking status: Current Every Day Smoker    Packs/day: 0.50    Years: 6.00    Last attempt to quit: 05/11/2014  . Smokeless tobacco: Never Used  . Alcohol use No  . Drug use: No  . Sexual activity: Yes    Partners: Male    Birth control/ protection: Surgical   Other Topics Concern  . None   Social History Narrative  . None    Allergies:  Allergies  Allergen Reactions  . Nsaids Other (See Comments)    Contraindicated  with gastric bypass    Metabolic Disorder Labs: Lab Results  Component Value Date   HGBA1C 5.3 02/26/2015   MPG 105 02/26/2015   No results found for: PROLACTIN No results found for: CHOL, TRIG, HDL, CHOLHDL, VLDL, LDLCALC   Current Medications: Current Outpatient Prescriptions  Medication Sig Dispense Refill  . divalproex (DEPAKOTE ER) 250 MG 24 hr tablet Take 5 tablets (1,250 mg total) by mouth at bedtime. 150 tablet 1  . FLUoxetine (PROZAC) 20 MG tablet Take 4 tablets (80 mg total) by mouth daily. 120 tablet 1  . lamoTRIgine (LAMICTAL) 25 MG tablet Take 1 tablet (25 mg total) by mouth daily. 30 tablet 1  . LORazepam (ATIVAN) 1 MG tablet Take 1 tablet (1 mg total) by mouth 3 (three) times daily as needed for anxiety. 90 tablet 1  . QUEtiapine (SEROQUEL) 100 MG tablet Take 1 tablet (100 mg total) by mouth 2 (two) times daily. 60 tablet 1  . QUEtiapine (SEROQUEL) 200 MG tablet Take 1 tablet (200 mg total) by mouth at bedtime. 30 tablet 1  . valACYclovir (VALTREX) 1000 MG tablet Take 1,000 mg by mouth as needed.     . zolpidem (AMBIEN) 5 MG tablet Take 1 tablet (5 mg total) by mouth at bedtime. 30 tablet 1   No current facility-administered medications for this visit.     Neurologic: Headache: No Seizure: No Paresthesias: No  Musculoskeletal: Strength & Muscle Tone: within normal limits Gait & Station: normal Patient leans: N/A  Psychiatric Specialty Exam: ROS  Blood pressure 106/76, pulse 83, height 5\' 4"  (1.626 m), weight 225 lb (102.1 kg).Body mass index is 38.62 kg/m.  General Appearance: Fairly Groomed  Eye Contact:  Good  Speech:  Slurred after dental surgery  Volume:  Normal  Mood:  "good"  Affect:  drowsy  Thought Process:  Coherent, occasionally disorganized, under the influence of pain medication  Orientation:  Full (Time, Place, and Person)  Thought Content: Logical   Suicidal Thoughts:  No  Homicidal Thoughts:  No  Memory:  Immediate;   Fair Recent;    Fair Remote;   Fair  Judgement:  Good  Insight:  Fair  Psychomotor Activity:  Normal  Concentration:  Concentration: Poor and Attention Span: Poor  Recall:  Good  Fund of Knowledge: Good  Language: Good  Akathisia:  No  Handed:  Right  AIMS (if indicated):  N/A  Assets:  Communication Skills Desire for Improvement  ADL's:  Intact  Cognition: WNL  Sleep:  poor   Assessment: Ashley Franklin a 46 year old female with bipolar disorder with mixed features, anemia, obesity s/p gastric bypass surgery. Patient presents for follow up appointment for Bipolar affective disorder, currently depressed, mild (HCC) - Plan: zolpidem (AMBIEN) 5 MG tablet, QUEtiapine (SEROQUEL) 200 MG tablet, FLUoxetine (  PROZAC) 20 MG tablet   # Bipolar II disorder, mixed # Unspecified anxiety disorder Although exam is notable for her drowsiness during the interview secondary to tramadol use after dental surgery, she reports significant improvement in her neurovegetative symptoms/irritability since medication change. Will continue Depakote at current dose for mood swings. Will monitor labs. Will continue lamotrigine, quetiapine for mood dysregulation. Will continue fluoxetine for depression. Will continue ativan prn for anxiety. Will continue Ambien for insomnia. She has not seen a therapist as dicussed at the last encounter; will continue to discuss as needed.    Plan:  1. Continue Depakote 1250 mg at night 2. Get blood test (VPA, CBC, CMP, Lipid, HbA1c) 3. Continue fluoxetine 80 mg daily 4. Continue lamotrigine 25 mg daily 5. Continue quetiapine 100 mg twice a day 200 mg at night 6. Continue ativan 1 mg three times a day as needed for anxiety and irritability 7. Continue Ambien 5 mg at night 8. Return to clinic in two months for 30 mins  The patient demonstrates the following risk factors for suicide: Chronic risk factorsfor suicide include psychiatric disorder /bipolar disorder,previous self-harm of  scratching herself. Acute risk factorsfor suicide includenone. Protective factorsfor this patient include positive social support, positive therapeutic relationship,hope for the future. Considering these factors, the overall suicide risk at this point appears to be low. Patient does have gun access at home, which she declined to lock. Discussed in detail safety plan that anytime having active suicidal thoughts or homicidal thoughts and she need to call 911 or go to local emergency room.   Treatment Plan Summary:Plan as above  The duration of this appointment visit was 30 minutes of face-to-face time with the patient.  Greater than 50% of this time was spent in counseling, explanation of  diagnosis, planning of further management, and coordination of care.  [name], MD 11/14/2016, 11:58 AM

## 2016-11-14 ENCOUNTER — Encounter (HOSPITAL_COMMUNITY): Payer: Self-pay | Admitting: Psychiatry

## 2016-11-14 ENCOUNTER — Ambulatory Visit (INDEPENDENT_AMBULATORY_CARE_PROVIDER_SITE_OTHER): Payer: 59 | Admitting: Psychiatry

## 2016-11-14 DIAGNOSIS — D649 Anemia, unspecified: Secondary | ICD-10-CM

## 2016-11-14 DIAGNOSIS — F3131 Bipolar disorder, current episode depressed, mild: Secondary | ICD-10-CM

## 2016-11-14 DIAGNOSIS — Z818 Family history of other mental and behavioral disorders: Secondary | ICD-10-CM | POA: Diagnosis not present

## 2016-11-14 DIAGNOSIS — F17210 Nicotine dependence, cigarettes, uncomplicated: Secondary | ICD-10-CM

## 2016-11-14 DIAGNOSIS — E669 Obesity, unspecified: Secondary | ICD-10-CM

## 2016-11-14 DIAGNOSIS — Z9884 Bariatric surgery status: Secondary | ICD-10-CM

## 2016-11-14 DIAGNOSIS — Z6838 Body mass index (BMI) 38.0-38.9, adult: Secondary | ICD-10-CM | POA: Diagnosis not present

## 2016-11-14 DIAGNOSIS — F419 Anxiety disorder, unspecified: Secondary | ICD-10-CM

## 2016-11-14 DIAGNOSIS — Z813 Family history of other psychoactive substance abuse and dependence: Secondary | ICD-10-CM

## 2016-11-14 MED ORDER — DIVALPROEX SODIUM ER 250 MG PO TB24: 1250.0000 mg | ORAL_TABLET | Freq: Every day | ORAL | 1 refills | Status: DC

## 2016-11-14 MED ORDER — LORAZEPAM 1 MG PO TABS: 1.0000 mg | ORAL_TABLET | Freq: Three times a day (TID) | ORAL | 1 refills | Status: DC | PRN

## 2016-11-14 MED ORDER — ZOLPIDEM TARTRATE 5 MG PO TABS: 5.0000 mg | ORAL_TABLET | Freq: Every day | ORAL | 1 refills | Status: DC

## 2016-11-14 MED ORDER — FLUOXETINE HCL 20 MG PO TABS: 80.0000 mg | ORAL_TABLET | Freq: Every day | ORAL | 1 refills | Status: DC

## 2016-11-14 MED ORDER — QUETIAPINE FUMARATE 200 MG PO TABS: 200.0000 mg | ORAL_TABLET | Freq: Every day | ORAL | 1 refills | Status: DC

## 2016-11-14 MED ORDER — LAMOTRIGINE 25 MG PO TABS: 25.0000 mg | ORAL_TABLET | Freq: Every day | ORAL | 1 refills | Status: DC

## 2016-11-14 MED ORDER — QUETIAPINE FUMARATE 100 MG PO TABS: 100.0000 mg | ORAL_TABLET | Freq: Two times a day (BID) | ORAL | 1 refills | Status: DC

## 2016-11-14 NOTE — Patient Instructions (Signed)
1. Continue Depakote 1250 mg at night 2. Get blood test (VPA, CBC, CMP, Lipid, HbA1c) 3. Continue fluoxetine 80 mg daily 4. Continue lamotrigine 25 mg daily 5. Continue quetiapine 100 mg twice a day 200 mg at night 6. Continue ativan 1 mg three times a day as needed for anxiety and irritability 7. Continue Ambien 5 mg at night 8. Return to clinic in two months for 30 mins

## 2016-11-20 MED FILL — ONDANSETRON HCL 8 MG TABLET: 8 | 4 days supply | Qty: 8 | Fill #0

## 2016-11-22 MED FILL — DIVALPROEX SOD ER 250 MG TA: 250 | 30 days supply | Qty: 150 | Fill #0

## 2016-11-22 MED FILL — VALACYCLOVIR HCL 500 MG TAB: 500 | 1 days supply | Qty: 8 | Fill #0

## 2016-11-22 MED FILL — traMADol HCL 50 MG TABS: 50 | 2 days supply | Qty: 20 | Fill #0

## 2016-11-22 MED FILL — FLUoxetine HCL 20 MG TABS: 20 | 30 days supply | Qty: 120 | Fill #0

## 2016-11-22 MED FILL — IBUPROFEN 800 MG TABLET: 800 | 7 days supply | Qty: 30 | Fill #0

## 2016-11-22 MED FILL — lamoTRIgine 25 MG TABS: 25 | 30 days supply | Qty: 30 | Fill #0

## 2016-11-24 MED FILL — QUETIAPINE FUMARATE 200 MG: 200 | 30 days supply | Qty: 30 | Fill #0

## 2016-11-24 MED FILL — LORazepam 1 MG TABS: 1 | 30 days supply | Qty: 90 | Fill #0

## 2016-11-24 MED FILL — ZOLPIDEM TARTRATE 5 MG TAB: 5 | 30 days supply | Qty: 30 | Fill #0

## 2016-11-24 MED FILL — QUETIAPINE FUMARATE 100 MG: 100 | 30 days supply | Qty: 60 | Fill #0

## 2016-12-19 DIAGNOSIS — R4189 Other symptoms and signs involving cognitive functions and awareness: Secondary | ICD-10-CM | POA: Diagnosis not present

## 2016-12-19 DIAGNOSIS — F449 Dissociative and conversion disorder, unspecified: Secondary | ICD-10-CM | POA: Diagnosis not present

## 2016-12-19 DIAGNOSIS — F3170 Bipolar disorder, currently in remission, most recent episode unspecified: Secondary | ICD-10-CM | POA: Diagnosis not present

## 2016-12-22 MED FILL — ZOLPIDEM TARTRATE 5 MG TAB: 5 | 30 days supply | Qty: 30 | Fill #1

## 2016-12-22 MED FILL — LORazepam 1 MG TABS: 1 | 30 days supply | Qty: 90 | Fill #1

## 2016-12-22 MED FILL — QUETIAPINE FUMARATE 100 MG: 100 | 30 days supply | Qty: 60 | Fill #1

## 2016-12-22 MED FILL — DIVALPROEX SOD ER 250 MG TA: 250 | 30 days supply | Qty: 150 | Fill #1

## 2016-12-22 MED FILL — lamoTRIgine 25 MG TABS: 25 | 30 days supply | Qty: 30 | Fill #1

## 2016-12-22 MED FILL — QUETIAPINE FUMARATE 200 MG: 200 | 30 days supply | Qty: 30 | Fill #1

## 2016-12-22 MED FILL — FLUoxetine HCL 20 MG TABS: 20 | 30 days supply | Qty: 120 | Fill #1

## 2016-12-27 NOTE — Progress Notes (Signed)
BH MD/PA/NP OP Progress Note  01/02/2017 11:59 AM Ashley Franklin  MRN:  [id number]  Chief Complaint:  Chief Complaint    Follow-up; Other     Subjective:  "I'm doing much better" HPI:  - Patient was referred for neuropsychological evaluation for multiple neurological complaints. Differential include follows:  AXIS I: Cognitive Change  Rule out somatization reaction or disorder Rule out Factitious Disorder Dissociative Episodes Bipolar Disorder NOS, likely schizoaffective  Brain MRI 07/2016, EEG 06/2016: non significant.  Patient presents for follow up appointment for bipolar disorder. She states that she feels much better since the last appointment. She still has some difficulty at work due to decreased concentration. She is under evaluation for this. She feels more motivated and has applied for office work. She has been on carb diet and has been able to lose weight. She denies irritability, stating that she has more self worth. She endorses insomnia; she has difficulty with sleeping in, especially because she needs to sleep at 7 pm. She tends to have vivid dreams (she denies trauma history). She feels anxious at times; takes ativan occasionally. She denies panic attacks. She denies euphoria or increased energy. She denies SI, HI, AH, VH.   Wt Readings from Last 3 Encounters:  01/02/17 221 lb 6.4 oz (100.4 kg)  11/14/16 225 lb (102.1 kg)  09/18/16 239 lb (108.4 kg)    Per [facility] Ambien, ativan filled on 12/22/2016   Visit Diagnosis:    ICD-10-CM   1. Bipolar affective disorder, currently depressed, mild (HCC) F31.31 zolpidem (AMBIEN) 5 MG tablet    QUEtiapine (SEROQUEL) 200 MG tablet    FLUoxetine (PROZAC) 20 MG tablet    Past Psychiatric History:  I have reviewed the patient's psychiatry history in detail and updated the patient record.  Outpatient: [facility]. Previously she was seen at [facility] in [place].  She was diagnosed with borderline personality disorder. She takes antidepressant since 2005.  Psychiatry admission: one admission in March 2015 for depression and SI Previous suicide attempt: denies Past trials of medication: Lexapro, Paxil, amitriptyline, Abilify (irritable), oxcarbazepine, perphenazine, Trifluoperazine, Klonopin, Valium. Vistaril,  History of violence: denies  Past Medical History:  Past Medical History:  Diagnosis Date  . Anemia   . Anxiety   . Bipolar affective disorder (HCC)   . Blood transfusion without reported diagnosis   . Depression   . Kidney stone   . SBO (small bowel obstruction) (HCC) 01/2014    Past Surgical History:  Procedure Laterality Date  . CESAREAN SECTION    . GASTRIC BYPASS  2001  . TUBAL LIGATION    . uterine ablasion      Family Psychiatric History:  I have reviewed the patient's family history in detail and updated the patient record.  Family History:  Family History  Problem Relation Age of Onset  . Diabetes Mother   . Cancer Mother 60       Throat   . Bipolar disorder Mother   . Bipolar disorder Sister   . Bipolar disorder Brother   . Drug abuse Brother   . Bipolar disorder Sister     Social History:  Social History   Social History  . Marital status: Single    Spouse name: N/A  . Number of children: N/A  . 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 Smoking status:  Current Every Day Smoker    Packs/day: 0.50    Years: 6.00    Last attempt to quit: 05/11/2014  . Smokeless tobacco: Never Used  . Alcohol use No  . Drug use: No  . Sexual activity: Yes    Partners: Male    Birth control/ protection: Surgical   Other Topics Concern  . None   Social History Narrative  . None    Allergies:  Allergies  Allergen Reactions  . Nsaids Other (See Comments)    Contraindicated with gastric bypass    Metabolic Disorder Labs: Lab Results   Component Value Date   HGBA1C 5.3 02/26/2015   MPG 105 02/26/2015   No results found for: PROLACTIN No results found for: CHOL, TRIG, HDL, CHOLHDL, VLDL, LDLCALC   Current Medications: Current Outpatient Prescriptions  Medication Sig Dispense Refill  . divalproex (DEPAKOTE ER) 250 MG 24 hr tablet Take 5 tablets (1,250 mg total) by mouth at bedtime. 150 tablet 2  . FLUoxetine (PROZAC) 20 MG tablet Take 4 tablets (80 mg total) by mouth daily. 120 tablet 2  . lamoTRIgine (LAMICTAL) 25 MG tablet Take 1 tablet (25 mg total) by mouth daily. 30 tablet 2  . LORazepam (ATIVAN) 1 MG tablet Take 1 tablet (1 mg total) by mouth 3 (three) times daily as needed for anxiety. 90 tablet 2  . QUEtiapine (SEROQUEL) 100 MG tablet Take 1 tablet (100 mg total) by mouth 2 (two) times daily. 60 tablet 2  . QUEtiapine (SEROQUEL) 200 MG tablet Take 1 tablet (200 mg total) by mouth at bedtime. 30 tablet 2  . valACYclovir (VALTREX) 1000 MG tablet Take 1,000 mg by mouth as needed.     . zolpidem (AMBIEN) 5 MG tablet Take 1 tablet (5 mg total) by mouth at bedtime. 30 tablet 2  . traZODone (DESYREL) 50 MG tablet 25-50 mg at night as needed for sleep 30 tablet 2   No current facility-administered medications for this visit.     Neurologic: Headache: No Seizure: No Paresthesias: No  Musculoskeletal: Strength & Muscle Tone: within normal limits Gait & Station: normal Patient leans: N/A  Psychiatric Specialty Exam: Review of Systems  Psychiatric/Behavioral: Positive for memory loss. Negative for depression, hallucinations, substance abuse and suicidal ideas. The patient is nervous/anxious and has insomnia.   All other systems reviewed and are negative.   Blood pressure 99/63, pulse 81, height 5\' 4"  (1.626 m), weight 221 lb 6.4 oz (100.4 kg).Body mass index is 38 kg/m.  General Appearance: Well Groomed  Eye Contact:  Good  Speech:  Clear and Coherent  Volume:  Normal  Mood:  "good"  Affect:  Appropriate,  Congruent and Full Range  Thought Process:  Coherent and Goal Directed  Orientation:  Full (Time, Place, and Person)  Thought Content: Logical Perceptions: denies AH/VH  Suicidal Thoughts:  No  Homicidal Thoughts:  No  Memory:  Immediate;   Good Recent;   Good Remote;   Good  Judgement:  Good  Insight:  Fair  Psychomotor Activity:  Normal  Concentration:  Concentration: Good and Attention Span: Good  Recall:  Good  Fund of Knowledge: Good  Language: Good  Akathisia:  No  Handed:  Right  AIMS (if indicated):  No tremors  Assets:  Communication Skills Desire for Improvement  ADL's:  Intact  Cognition: WNL  Sleep:  poor   Assessment: ENGLAND GREB is a 46 y.o. year old female with a history of bipolar disorder with mixed features, anemia, obesity  s/p gastric bypass surgery, who presents for follow up appointment for Bipolar affective disorder, currently depressed, mild (HCC) - Plan: zolpidem (AMBIEN) 5 MG tablet, QUEtiapine (SEROQUEL) 200 MG tablet, FLUoxetine (PROZAC) 20 MG tablet  # Bipolar II disorder, mixed # Unspecified anxiety disorder Exam is notable for significantly improved affect and she reports improvement in her mood symptoms. Will continue Depakote as mood stabilization; she is reminded to get blood test. Will continue fluoxetine for depression, lamotrigine for bipolar depression. Will continue quetiapine to target modo dysregulation. Will continue ativan prn for anxiety. She will greatly benefit from CBT; she is encouraged to contact therapist for appointment.   # Insomnia Patient endorses insomnia. Will continue Ambien prn for insomnia; she is advised not to take higher dose given concern for it side effect on cognition. Will start trazodone prn for insomnia. Discussed risk of oversedation.   Plan:  1. Continue Depakote 1250 mg at night 2. Obtain blood test (VPA, CBC, CMP, Lipid, HbA1c) 3. Continue fluoxetine 80 mg daily 4. Continue lamotrigine 25 mg daily 5.  Continue quetiapine 100 mg twice a day and 200 mg at night 6. Continue ativan 1 mg three times a day s needed for anxiety 7. Continue Ambien 5 mg at night as needed for sleep (advised to taper off this medication) 8. Start Trazodone 25-50 mg at night as needed for sleep 9. Return to clinic in three months for 30 mins 10. Contact [place] office for therapy [phone]  The patient demonstrates the following risk factors for suicide: Chronic risk factorsfor suicide include psychiatric disorder /bipolar disorder,previous self-harm of scratching herself. Acute risk factorsfor suicide includenone. Protective factorsfor this patient include positive social support, positive therapeutic relationship,hope for the future. Considering these factors, the overall suicide risk at this point appears to be low. Patient does have gun access at home, which she declined to lock. Discussed in detail safety plan that anytime having active suicidal thoughts or homicidal thoughts and she need to call 911 or go to local emergency room.   Treatment Plan Summary:Plan as above  The duration of this appointment visit was 30 minutes of face-to-face time with the patient.  Greater than 50% of this time was spent in counseling, explanation of  diagnosis, planning of further management, and coordination of care.  Neysa Hottereina Marionna Gonia, MD 01/02/2017, 11:59 AM

## 2017-01-02 ENCOUNTER — Encounter (HOSPITAL_COMMUNITY): Payer: Self-pay | Admitting: Psychiatry

## 2017-01-02 ENCOUNTER — Ambulatory Visit (INDEPENDENT_AMBULATORY_CARE_PROVIDER_SITE_OTHER): Payer: 59 | Admitting: Psychiatry

## 2017-01-02 DIAGNOSIS — Z6838 Body mass index (BMI) 38.0-38.9, adult: Secondary | ICD-10-CM

## 2017-01-02 DIAGNOSIS — F17210 Nicotine dependence, cigarettes, uncomplicated: Secondary | ICD-10-CM

## 2017-01-02 DIAGNOSIS — F3131 Bipolar disorder, current episode depressed, mild: Secondary | ICD-10-CM

## 2017-01-02 DIAGNOSIS — Z818 Family history of other mental and behavioral disorders: Secondary | ICD-10-CM | POA: Diagnosis not present

## 2017-01-02 DIAGNOSIS — Z9884 Bariatric surgery status: Secondary | ICD-10-CM

## 2017-01-02 DIAGNOSIS — G4700 Insomnia, unspecified: Secondary | ICD-10-CM | POA: Diagnosis not present

## 2017-01-02 DIAGNOSIS — R413 Other amnesia: Secondary | ICD-10-CM | POA: Diagnosis not present

## 2017-01-02 DIAGNOSIS — E668 Other obesity: Secondary | ICD-10-CM | POA: Diagnosis not present

## 2017-01-02 DIAGNOSIS — Z813 Family history of other psychoactive substance abuse and dependence: Secondary | ICD-10-CM | POA: Diagnosis not present

## 2017-01-02 MED ORDER — QUETIAPINE FUMARATE 100 MG PO TABS: 100.0000 mg | ORAL_TABLET | Freq: Two times a day (BID) | ORAL | 2 refills | Status: DC

## 2017-01-02 MED ORDER — QUETIAPINE FUMARATE 200 MG PO TABS: 200.0000 mg | ORAL_TABLET | Freq: Every day | ORAL | 2 refills | Status: DC

## 2017-01-02 MED ORDER — TRAZODONE HCL 50 MG PO TABS: ORAL_TABLET | ORAL | 2 refills | Status: DC

## 2017-01-02 MED ORDER — DIVALPROEX SODIUM ER 250 MG PO TB24: 1250.0000 mg | ORAL_TABLET | Freq: Every day | ORAL | 2 refills | Status: DC

## 2017-01-02 MED ORDER — ZOLPIDEM TARTRATE 5 MG PO TABS: 5.0000 mg | ORAL_TABLET | Freq: Every day | ORAL | 2 refills | Status: DC

## 2017-01-02 MED ORDER — LORAZEPAM 1 MG PO TABS: 1.0000 mg | ORAL_TABLET | Freq: Three times a day (TID) | ORAL | 2 refills | Status: DC | PRN

## 2017-01-02 MED ORDER — LAMOTRIGINE 25 MG PO TABS: 25.0000 mg | ORAL_TABLET | Freq: Every day | ORAL | 2 refills | Status: DC

## 2017-01-02 MED ORDER — FLUOXETINE HCL 20 MG PO TABS: 80.0000 mg | ORAL_TABLET | Freq: Every day | ORAL | 2 refills | Status: DC

## 2017-01-02 NOTE — Patient Instructions (Signed)
1. Continue Depakote 1250 mg at night 2. Obtain blood test 3. Continue fluoxetine 80 mg daily 4. Continue lamotrigine 25 mg daily 5. Continue quetiapine 100 mg twice a day and 200 mg at night 6. Continue ativan 1 mg three times a day s needed for anxiety 7. Continue ambien 5 mg at night as needed for sleep (consider taking 2.5 mg for three days before discontinue it) 8. Start Trazodone 25-50 mg at night as needed for sleep 9. Return to clinic in three months for 30 mins 10. Contact [place] office for therapy [phone]

## 2017-01-09 MED FILL — traZODone HCL 50 MG TABS: 50 | 30 days supply | Qty: 30 | Fill #0

## 2017-01-12 ENCOUNTER — Telehealth (HOSPITAL_COMMUNITY): Payer: Self-pay | Admitting: *Deleted

## 2017-01-12 NOTE — Telephone Encounter (Signed)
Provider wanted to see of fill in provider could check pt VPA, CBC, CMP, LIPID and HbA1c while she was out of the office. Per provider, she did not want pt number to be at a toxicity level. Staff looked in pt chart to see if pt labs were done and notice there was no order for any of these labs. Message will be sent to provider to let her know.

## 2017-01-17 NOTE — Telephone Encounter (Signed)
Please remind her to obtain blood test

## 2017-01-17 NOTE — Telephone Encounter (Signed)
noted 

## 2017-01-18 MED FILL — traMADol HCL 50 MG TABS: 50 | 4 days supply | Qty: 16 | Fill #0

## 2017-01-18 NOTE — Telephone Encounter (Signed)
Staff printed letter and letter is in provider's box

## 2017-01-22 MED FILL — QUETIAPINE FUMARATE 100 MG: 100 | 30 days supply | Qty: 60 | Fill #0

## 2017-01-22 MED FILL — DIVALPROEX SOD ER 250 MG TA: 250 | 30 days supply | Qty: 150 | Fill #0

## 2017-01-22 MED FILL — QUETIAPINE FUMARATE 200 MG: 200 | 30 days supply | Qty: 30 | Fill #0

## 2017-01-22 MED FILL — lamoTRIgine 25 MG TABS: 25 | 30 days supply | Qty: 30 | Fill #0

## 2017-01-22 MED FILL — ZOLPIDEM TARTRATE 5 MG TAB: 5 | 30 days supply | Qty: 30 | Fill #0

## 2017-01-22 MED FILL — FLUoxetine HCL 20 MG TABS: 20 | 30 days supply | Qty: 120 | Fill #0

## 2017-01-22 MED FILL — LORazepam 1 MG TABS: 1 | 30 days supply | Qty: 90 | Fill #0

## 2017-01-24 NOTE — Telephone Encounter (Signed)
Staff called pt again today to see inf she had completed her labs that provider wanted her to get. Staff unable to reach pt and lmtcb and office number was provided on pt voicemail.

## 2017-01-24 NOTE — Telephone Encounter (Signed)
Per previous message staff meant to say called pt to verify if she had labs completed was unable to reach pt and lmtcb

## 2017-02-06 ENCOUNTER — Other Ambulatory Visit (HOSPITAL_COMMUNITY): Payer: Self-pay | Admitting: Psychiatry

## 2017-02-06 DIAGNOSIS — E669 Obesity, unspecified: Secondary | ICD-10-CM | POA: Diagnosis not present

## 2017-02-06 DIAGNOSIS — Z9884 Bariatric surgery status: Secondary | ICD-10-CM | POA: Diagnosis not present

## 2017-02-06 DIAGNOSIS — F3131 Bipolar disorder, current episode depressed, mild: Secondary | ICD-10-CM | POA: Diagnosis not present

## 2017-02-06 DIAGNOSIS — K912 Postsurgical malabsorption, not elsewhere classified: Secondary | ICD-10-CM | POA: Diagnosis not present

## 2017-02-07 LAB — COMPREHENSIVE METABOLIC PANEL
ALK PHOS: 43 IU/L (ref 39–117)
ALT: 22 IU/L (ref 0–32)
AST: 31 IU/L (ref 0–40)
Albumin/Globulin Ratio: 1.9 (ref 1.2–2.2)
Albumin: 3.2 g/dL — ABNORMAL LOW (ref 3.5–5.5)
BILIRUBIN TOTAL: 0.2 mg/dL (ref 0.0–1.2)
BUN/Creatinine Ratio: 19 (ref 9–23)
BUN: 12 mg/dL (ref 6–24)
CHLORIDE: 104 mmol/L (ref 96–106)
CO2: 25 mmol/L (ref 20–29)
Calcium: 8.5 mg/dL — ABNORMAL LOW (ref 8.7–10.2)
Creatinine, Ser: 0.62 mg/dL (ref 0.57–1.00)
GFR calc non Af Amer: 110 mL/min/{1.73_m2} (ref >59)
GFR, EST AFRICAN AMERICAN: 127 mL/min/{1.73_m2} (ref >59)
Globulin, Total: 1.7 g/dL (ref 1.5–4.5)
Glucose: 76 mg/dL (ref 65–99)
Potassium: 4.3 mmol/L (ref 3.5–5.2)
Sodium: 141 mmol/L (ref 134–144)
TOTAL PROTEIN: 4.9 g/dL — AB (ref 6.0–8.5)

## 2017-02-07 LAB — CBC WITH DIFFERENTIAL/PLATELET
BASOS: 1 %
Basophils Absolute: 0 10*3/uL (ref 0.0–0.2)
EOS (ABSOLUTE): 0.1 10*3/uL (ref 0.0–0.4)
Eos: 2 %
Hematocrit: 39.5 % (ref 34.0–46.6)
Hemoglobin: 13.3 g/dL (ref 11.1–15.9)
Immature Grans (Abs): 0 10*3/uL (ref 0.0–0.1)
Immature Granulocytes: 1 %
Lymphocytes Absolute: 2.3 10*3/uL (ref 0.7–3.1)
Lymphs: 42 %
MCH: 30.9 pg (ref 26.6–33.0)
MCHC: 33.7 g/dL (ref 31.5–35.7)
MCV: 92 fL (ref 79–97)
Monocytes Absolute: 0.5 10*3/uL (ref 0.1–0.9)
Monocytes: 9 %
NEUTROS PCT: 45 %
Neutrophils Absolute: 2.6 10*3/uL (ref 1.4–7.0)
PLATELETS: 201 10*3/uL (ref 150–379)
RBC: 4.3 x10E6/uL (ref 3.77–5.28)
RDW: 14.4 % (ref 12.3–15.4)
WBC: 5.6 10*3/uL (ref 3.4–10.8)

## 2017-02-07 LAB — LIPID PANEL W/O CHOL/HDL RATIO
CHOLESTEROL TOTAL: 165 mg/dL (ref 100–199)
HDL: 54 mg/dL (ref >39)
LDL Calculated: 94 mg/dL (ref 0–99)
Triglycerides: 87 mg/dL (ref 0–149)
VLDL CHOLESTEROL CAL: 17 mg/dL (ref 5–40)

## 2017-02-07 LAB — VALPROIC ACID LEVEL: Valproic Acid Lvl: 43 ug/mL — ABNORMAL LOW (ref 50–100)

## 2017-02-07 LAB — HGB A1C W/O EAG: Hgb A1c MFr Bld: 4.9 % (ref 4.8–5.6)

## 2017-02-19 MED FILL — ZOLPIDEM TARTRATE 5 MG TABL: 5 | 30 days supply | Qty: 30 | Fill #1

## 2017-02-19 MED FILL — traZODone HCL 50 MG TABS: 50 | 30 days supply | Qty: 30 | Fill #1

## 2017-02-20 DIAGNOSIS — K912 Postsurgical malabsorption, not elsewhere classified: Secondary | ICD-10-CM | POA: Diagnosis not present

## 2017-03-01 ENCOUNTER — Other Ambulatory Visit (HOSPITAL_COMMUNITY): Payer: Self-pay | Admitting: General Surgery

## 2017-03-01 ENCOUNTER — Other Ambulatory Visit: Payer: Self-pay | Admitting: General Surgery

## 2017-03-01 DIAGNOSIS — Z9884 Bariatric surgery status: Secondary | ICD-10-CM

## 2017-03-05 MED FILL — LORazepam 1 MG TABS: 1 | 30 days supply | Qty: 90 | Fill #1

## 2017-03-05 MED FILL — QUETIAPINE FUMARATE 200 MG: 200 | 30 days supply | Qty: 30 | Fill #1

## 2017-03-05 MED FILL — DIVALPROEX SOD ER 250 MG TA: 250 | 30 days supply | Qty: 150 | Fill #1

## 2017-03-06 ENCOUNTER — Encounter: Payer: 59 | Attending: General Surgery | Admitting: Skilled Nursing Facility1

## 2017-03-06 ENCOUNTER — Encounter: Payer: Self-pay | Admitting: Skilled Nursing Facility1

## 2017-03-06 DIAGNOSIS — Z713 Dietary counseling and surveillance: Secondary | ICD-10-CM | POA: Insufficient documentation

## 2017-03-06 DIAGNOSIS — F329 Major depressive disorder, single episode, unspecified: Secondary | ICD-10-CM | POA: Insufficient documentation

## 2017-03-06 DIAGNOSIS — Z79899 Other long term (current) drug therapy: Secondary | ICD-10-CM | POA: Insufficient documentation

## 2017-03-06 DIAGNOSIS — Z9884 Bariatric surgery status: Secondary | ICD-10-CM | POA: Insufficient documentation

## 2017-03-06 DIAGNOSIS — E669 Obesity, unspecified: Secondary | ICD-10-CM | POA: Diagnosis not present

## 2017-03-06 DIAGNOSIS — F419 Anxiety disorder, unspecified: Secondary | ICD-10-CM | POA: Diagnosis not present

## 2017-03-06 DIAGNOSIS — K8020 Calculus of gallbladder without cholecystitis without obstruction: Secondary | ICD-10-CM | POA: Insufficient documentation

## 2017-03-06 DIAGNOSIS — Z9889 Other specified postprocedural states: Secondary | ICD-10-CM | POA: Diagnosis not present

## 2017-03-06 DIAGNOSIS — Z87891 Personal history of nicotine dependence: Secondary | ICD-10-CM | POA: Insufficient documentation

## 2017-03-06 NOTE — Progress Notes (Signed)
Pre-Op Assessment Visit:  Pre-Operative RYGB Surgery  Medical Nutrition Therapy:  Appt start time: 10:30 End time:  11:30  Patient was seen on 03/06/2017 for Pre-Operative Nutrition Assessment. Assessment and [facility] coordinator on 03/06/2017.   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 Pt states she did not get to her goal wt due to her husband leaving her and her children leaving for college. Pt states she fell in love with  A 450 pound man and gained all of her lost wt back. Pt states her husband is also getting bariatric surgery. Pt states in 2000 she also got the bariatric surgery with her husband. Pt states she has lost 19 pounds in the last 6-8 months with the use of a weight loss clinic. Pt states she thinks she needs more [place]. Pt states she Didn't do counsling due to finances her psychiatrist wanted her to see someone. Pt states she has had a bowel obstruction. Pt states her body will not absorb protein. Pt states she has had low vitamin b12 and a series of iron infusions. Pt states she has 1 bowel movement every 4 days.  Pt was very excited to hear [facility] has a counseling program for free.  Pt was educated on compliance, behaviors that need to be addressed, and consequences.  Start weight at [facility]: 228.1 BMI: 40.41   24 hr Dietary Recall: waking at 4 am First Meal: cresaunt sandwich out + protein drink---skipped Snack:  Second Meal 1-2: fajita chicken, broccoli, rice or soup or salad---skippedo Snack:  Third Meal: bologna and eggs or stir fry or taco salad Snack: 10 pizza roles, chips, cookies Beverages: sprite zero, coffee  Encouraged to engage in 150 minutes of moderate physical activity including cardiovascular and weight baring weekly  Handouts given during visit include:  . Pre-Op Goals . Bariatric Surgery Protein Shakes During the appointment today the following  Pre-Op Goals were reviewed with the patient: . Maintain or lose weight as instructed by your surgeon . Make healthy food choices . Begin to limit portion sizes . Limited concentrated sugars and fried foods . Keep fat/sugar in the single digits per serving on             food labels . Practice CHEWING your food  (aim for 30 chews per bite or until applesauce consistency) . Practice not drinking 15 minutes before, during, and 30 minutes after each meal/snack . Avoid all carbonated beverages  . Avoid/limit caffeinated beverages  . Avoid all sugar-sweetened beverages . Consume 3 meals per day; eat every 3-5 hours . Make a list of non-food related activities . Aim for 64-100 ounces of FLUID daily  . Aim for at least 60-80 grams of PROTEIN daily . Look for a liquid protein source that contain ?15 g protein and ?5 g carbohydrate  (ex: shakes, drinks, shots)  -Follow diet recommendations listed below   Energy and Macronutrient Recomendations: Calories: 1500 Carbohydrate: 170 Protein: 112 Fat: 42  Demonstrated degree of understanding via:  Teach Back  Teaching Method Utilized:  Visual Auditory Hands on  Barriers to learning/adherence to lifestyle change: contemplative stage of change   Patient to call the Nutrition and Diabetes Education Services to enroll in Pre-Op and Post-Op Nutrition Education when surgery date is scheduled.

## 2017-03-06 NOTE — Patient Instructions (Addendum)
-  [employer] [place] EACP

## 2017-03-12 ENCOUNTER — Ambulatory Visit (HOSPITAL_COMMUNITY)
Admission: RE | Admit: 2017-03-12 | Discharge: 2017-03-12 | Disposition: A | Discharge status: Home / Self Care | Payer: 59 | Source: Ambulatory Visit | Attending: General Surgery | Admitting: General Surgery

## 2017-03-12 DIAGNOSIS — Z9884 Bariatric surgery status: Secondary | ICD-10-CM | POA: Diagnosis not present

## 2017-03-19 ENCOUNTER — Encounter: Payer: 59 | Attending: General Surgery | Admitting: Registered"

## 2017-03-19 DIAGNOSIS — Z9889 Other specified postprocedural states: Secondary | ICD-10-CM | POA: Diagnosis not present

## 2017-03-19 DIAGNOSIS — F329 Major depressive disorder, single episode, unspecified: Secondary | ICD-10-CM | POA: Diagnosis not present

## 2017-03-19 DIAGNOSIS — Z87891 Personal history of nicotine dependence: Secondary | ICD-10-CM | POA: Diagnosis not present

## 2017-03-19 DIAGNOSIS — F419 Anxiety disorder, unspecified: Secondary | ICD-10-CM | POA: Insufficient documentation

## 2017-03-19 DIAGNOSIS — E669 Obesity, unspecified: Secondary | ICD-10-CM | POA: Insufficient documentation

## 2017-03-19 DIAGNOSIS — Z713 Dietary counseling and surveillance: Secondary | ICD-10-CM | POA: Diagnosis not present

## 2017-03-19 DIAGNOSIS — Z9884 Bariatric surgery status: Secondary | ICD-10-CM | POA: Diagnosis not present

## 2017-03-19 DIAGNOSIS — Z79899 Other long term (current) drug therapy: Secondary | ICD-10-CM | POA: Insufficient documentation

## 2017-03-19 DIAGNOSIS — K8020 Calculus of gallbladder without cholecystitis without obstruction: Secondary | ICD-10-CM | POA: Insufficient documentation

## 2017-03-19 NOTE — Progress Notes (Signed)
  Pre-Operative Nutrition Class:  Appt start time: 8:15   End time:  9:15.  Patient was seen on 03/19/2017 for Pre-Operative Bariatric Surgery Education at [facility].   Surgery date: TBD Surgery type: revision from RYGB (2000) to sleeve  Start weight at [facility]: 228.1 Weight today: 233.7   Samples given per MNT protocol. Patient educated on appropriate usage: Bariatric Advantage Multivitamin Lot # [id number] Exp: 11/2017  Bariatric Advantage Calcium Citrate Lot # [id number] Exp: 02/15/2018  Renee Pain Protein Shake Lot # [id number] Exp: 10/09/2017   The following the learning objectives were met by the patient during this course:  Identify Pre-Op Dietary Goals and will begin 2 weeks pre-operatively  Identify appropriate sources of fluids and proteins   State protein recommendations and appropriate sources pre and post-operatively  Identify Post-Operative Dietary Goals and will follow for 2 weeks post-operatively  Identify appropriate multivitamin and calcium sources  Describe the need for physical activity post-operatively and will follow MD recommendations  State when to call healthcare provider regarding medication questions or post-operative complications  Handouts given during class include:  Pre-Op Bariatric Surgery Diet Handout  Protein Shake Handout  Post-Op Bariatric Surgery Nutrition Handout  BELT Program Information Flyer  Support Group Information Flyer  [facility] Bariatric Supplements Price List  Follow-Up Plan: Patient will follow-up at [facility] 2 weeks post operatively for diet advancement per MD.

## 2017-03-21 MED FILL — traZODone HCL 50 MG TABS: 50 | 30 days supply | Qty: 30 | Fill #2

## 2017-03-21 MED FILL — ZOLPIDEM TARTRATE 5 MG TABL: 5 | 30 days supply | Qty: 30 | Fill #2

## 2017-03-22 MED FILL — lamoTRIgine 25 MG TABS: 25 | 30 days supply | Qty: 30 | Fill #1

## 2017-03-28 NOTE — Progress Notes (Signed)
BH MD/PA/NP OP Progress Note  04/03/2017 9:48 AM Ashley Franklin  MRN:  [id number]  Chief Complaint:  Chief Complaint    Follow-up; Other; Depression     HPI:  Patient presents for follow-up appointment for bipolar disorder.  She feels better overall, although she continues to feel irritable at times.  She talks about an episode when her husband laughed when she was preparing food. She immediately became angry, thinking that he laughed at the food. Her husband states that he laughed as it has been a long time since she prepared a meal like this as a complement. She tends to become anxious in crowd. She feels overwhelmed by noises and people around her. She was able to hold anger when her husband was upset and yelling at her. She reports a day of euphoria, being talkative, hateful ("brew up"), and spending more than $200 on suite, which she returned the next day. She feels depressed at times. She enjoyed decoration of christmas tree. She has fair appetite and concentration. She denies SI. She denies insomnia or decreased need for sleep. She would like to up Depakote for irritability. She does not like to change other medication as holiday season approaches.   Wt Readings from Last 3 Encounters:  04/03/17 235 lb (106.6 kg)  03/19/17 233 lb 11.2 oz (106 kg)  03/06/17 228 lb 1.6 oz (103.5 kg)    Per PMP,  Zolpidem filled on 03/21/2017 Ativan filled on 03/05/2017  Visit Diagnosis:    ICD-10-CM   1. Bipolar affective disorder, currently depressed, mild (HCC) F31.31 zolpidem (AMBIEN) 5 MG tablet    QUEtiapine (SEROQUEL) 200 MG tablet    FLUoxetine (PROZAC) 20 MG tablet    Valproic acid level    Comprehensive Metabolic Panel (CMET)    Past Psychiatric History:  I have reviewed the patient's psychiatry history in detail and updated the patient record. Outpatient: [facility] [place] [facility] [place]. Previously she was seen at [facility] in [place]. She was  diagnosed with borderline personality disorder. She takes antidepressant since 2005.  Psychiatry admission: one admission in March 2015 for depression and SI Previous suicide attempt: denies Past trials of medication: Lexapro, Paxil, amitriptyline, Abilify (irritable), oxcarbazepine, perphenazine, Trifluoperazine, Klonopin, Valium. Vistaril,  History of violence: denies  Past Medical History:  Past Medical History:  Diagnosis Date  . Anemia   . Anxiety   . Bipolar affective disorder (HCC)   . Blood transfusion without reported diagnosis   . Bronchospasm with bronchitis, acute   . Depression   . Kidney stone   . SBO (small bowel obstruction) (HCC) 01/2014    Past Surgical History:  Procedure Laterality Date  . CESAREAN SECTION    . GASTRIC BYPASS  2001  . TUBAL LIGATION    . uterine ablasion      Family Psychiatric History:  I have reviewed the patient's family history in detail and updated the patient record.  Family History:  Family History  Problem Relation Age of Onset  . Diabetes Mother   . Cancer Mother 60       Throat   . Bipolar disorder Mother   . Bipolar disorder Sister   . Bipolar disorder Brother   . Drug abuse Brother   . Bipolar disorder Sister     Social History:  Social History   Socioeconomic History  . Marital status: Single    Spouse name: None  . Number of children: None  .  Years of education: None  . Highest education level: None  Social Needs  . Financial resource strain: None  . Food insecurity - worry: None  . Food insecurity - inability: None  . Transportation needs - medical: None  . Transportation needs - non-medical: None  Occupational History  . Occupation: phelbotomist    Employer: [employer]  Tobacco Use  . Smoking status: Current Every Day Smoker    Packs/day: 0.50    Years: 6.00    Pack years: 3.00    Last attempt to quit: 05/11/2014    Years since quitting: 2.8  . Smokeless tobacco: Never Used  Substance and Sexual  Activity  . Alcohol use: No    Alcohol/week: 0.0 oz  . Drug use: No  . Sexual activity: Yes    Partners: Male    Birth control/protection: Surgical  Other Topics Concern  . None  Social History Narrative  . None    Allergies:  Allergies  Allergen Reactions  . Nsaids Other (See Comments)    Contraindicated with gastric bypass    Metabolic Disorder Labs: Lab Results  Component Value Date   HGBA1C 4.9 02/06/2017   MPG 105 02/26/2015   No results found for: PROLACTIN Lab Results  Component Value Date   CHOL 165 02/06/2017   TRIG 87 02/06/2017   HDL 54 02/06/2017   LDLCALC 94 02/06/2017   Lab Results  Component Value Date   TSH 1.22 06/08/2016   TSH 0.74 06/25/2015    Therapeutic Level Labs: No results found for: LITHIUM Lab Results  Component Value Date   VALPROATE 43 (L) 02/06/2017   VALPROATE 33 (L) 05/26/2016   No components found for:  CBMZ  Current Medications: Current Outpatient Medications  Medication Sig Dispense Refill  . divalproex (DEPAKOTE ER) 500 MG 24 hr tablet Take 3 tablets (1,500 mg total) at bedtime by mouth. 90 tablet 2  . FLUoxetine (PROZAC) 20 MG tablet Take 4 tablets (80 mg total) daily by mouth. 120 tablet 2  . lamoTRIgine (LAMICTAL) 25 MG tablet Take 1 tablet (25 mg total) daily by mouth. 30 tablet 2  . LORazepam (ATIVAN) 1 MG tablet Take 1 tablet (1 mg total) 3 (three) times daily as needed by mouth for anxiety. 90 tablet 2  . QUEtiapine (SEROQUEL) 100 MG tablet Take 1 tablet (100 mg total) 2 (two) times daily by mouth. 60 tablet 2  . QUEtiapine (SEROQUEL) 200 MG tablet Take 1 tablet (200 mg total) at bedtime by mouth. 30 tablet 2  . traZODone (DESYREL) 50 MG tablet 25-50 mg at night as needed for sleep 30 tablet 2  . valACYclovir (VALTREX) 1000 MG tablet Take 1,000 mg by mouth as needed.     . zolpidem (AMBIEN) 5 MG tablet Take 1 tablet (5 mg total) at bedtime by mouth. 30 tablet 2   No current facility-administered medications for  this visit.      Musculoskeletal: Strength & Muscle Tone: within normal limits Gait & Station: normal Patient leans: N/A  Psychiatric Specialty Exam: Review of Systems  Psychiatric/Behavioral: Positive for depression. Negative for hallucinations, memory loss, substance abuse and suicidal ideas. The patient is nervous/anxious. The patient does not have insomnia.   All other systems reviewed and are negative.   Blood pressure 108/76, pulse 73, height 5\' 3"  (1.6 m), weight 235 lb (106.6 kg), SpO2 97 %.Body mass index is 41.63 kg/m.  General Appearance: Fairly Groomed  Eye Contact:  Good  Speech:  Clear and Coherent  Volume:  Normal  Mood:  "better"  Affect:  Appropriate, Congruent and calmer  Thought Process:  Coherent and Goal Directed  Orientation:  Full (Time, Place, and Person)  Thought Content: Logical Perceptions: denies AH/VH  Suicidal Thoughts:  No  Homicidal Thoughts:  No  Memory:  Immediate;   Good Recent;   Good Remote;   Good  Judgement:  Good  Insight:  Fair  Psychomotor Activity:  Normal  Concentration:  Concentration: Good and Attention Span: Good  Recall:  Good  Fund of Knowledge: Good  Language: Good  Akathisia:  No  Handed:  Right  AIMS (if indicated): not done  Assets:  Communication Skills Desire for Improvement  ADL's:  Intact  Cognition: WNL  Sleep:  Good on Ambien, trazodone   Screenings: PHQ2-9     Nutrition from 03/06/2017 in [facility] Visit from 06/08/2016 in [facility] Office Visit from 03/15/2015 in [facility] Office Visit from 01/12/2015 in [facility]  PHQ-2 Total Score  0  0  0  0  PHQ-9 Total Score  No data  2  No data  No data       Assessment and Plan:  BRAYLON LEMMONS is a 46 y.o. year old female with a history of bipolar II disorder, anxiety, anemia, obesity s/p gastric bypass surgery , who  presents for follow up appointment for Bipolar affective disorder, currently depressed, mild (HCC) - Plan: zolpidem (AMBIEN) 5 MG tablet, QUEtiapine (SEROQUEL) 200 MG tablet, FLUoxetine (PROZAC) 20 MG tablet, Valproic acid level, Comprehensive Metabolic Panel (CMET)  # Bipolar II disorder, mixed # Unspecified anxiety disorder There has been overall improvement in her mood symptoms since her last appointment. Will do further uptitration of Depakote to target mood dysregulation while monitoring weight gain. Will continue fluoxetine for depression. Will continue lamotrigine for bipolar depression.  Will continue quetiapine to target mood dysregulation.  Discussed metabolic side effect. Will continue Ativan as needed for anxiety.  Will continue Ambien and trazodone as needed for insomnia. Discussed risk of dependence and oversedation.  Will plan to taper off Ambien in the future, although it will be continued this time given upcoming holiday season, which she feels some stress about. Discussed cognitive defusion. Discussed effective communication. She is planning to see a counselor next week.   Plan:  Reviewed plan as below 1. Increase Depakote 1500 mg at night  2. Obtain blood test one week after increase up Depakote (VPA, LFT) 3. Continue fluoxetine 80 mg daily  4. Continue lamotrigine 25 mg daily  5. Continue quetiapine 100 mg twice a day and 200 mg at night 6. Continue ativan 1 mg three times a day s needed for anxiety 7. Continue Ambien 5 mg at night as needed for sleep  8. Continue Trazodone 25-50 mg at night as needed for sleep 9. Return to clinic in three months for 30 mins   The patient demonstrates the following risk factors for suicide: Chronic risk factorsfor suicide include psychiatric disorder /bipolar disorder,previous self-harm of scratching herself. Acute risk factorsfor suicide includenone. Protective factorsfor this patient include positive social support, positive  therapeutic relationship,hope for the future. Considering these factors, the overall suicide risk at this point appears to be low. Patient does have gun access at home, which she declined to lock. Discussed in detail safety plan that anytime having active suicidal thoughts or homicidal thoughts and she need to call 911  or go to local emergency room.   The duration of this appointment visit was 30 minutes of face-to-face time with the patient.  Greater than 50% of this time was spent in counseling, explanation of  diagnosis, planning of further management, and coordination of care.  [name], MD 04/03/2017, 9:48 AM

## 2017-04-03 ENCOUNTER — Ambulatory Visit (HOSPITAL_COMMUNITY): Payer: 59 | Admitting: Psychiatry

## 2017-04-03 ENCOUNTER — Encounter (HOSPITAL_COMMUNITY): Payer: Self-pay | Admitting: Psychiatry

## 2017-04-03 DIAGNOSIS — F419 Anxiety disorder, unspecified: Secondary | ICD-10-CM

## 2017-04-03 DIAGNOSIS — F17210 Nicotine dependence, cigarettes, uncomplicated: Secondary | ICD-10-CM

## 2017-04-03 DIAGNOSIS — Z813 Family history of other psychoactive substance abuse and dependence: Secondary | ICD-10-CM | POA: Diagnosis not present

## 2017-04-03 DIAGNOSIS — Z818 Family history of other mental and behavioral disorders: Secondary | ICD-10-CM

## 2017-04-03 DIAGNOSIS — F3131 Bipolar disorder, current episode depressed, mild: Secondary | ICD-10-CM | POA: Diagnosis not present

## 2017-04-03 DIAGNOSIS — Z9884 Bariatric surgery status: Secondary | ICD-10-CM | POA: Diagnosis not present

## 2017-04-03 DIAGNOSIS — R450 Nervousness: Secondary | ICD-10-CM

## 2017-04-03 MED ORDER — QUETIAPINE FUMARATE 100 MG PO TABS: 100.0000 mg | ORAL_TABLET | Freq: Two times a day (BID) | ORAL | 2 refills | Status: DC

## 2017-04-03 MED ORDER — LORAZEPAM 1 MG PO TABS: 1.0000 mg | ORAL_TABLET | Freq: Three times a day (TID) | ORAL | 2 refills | Status: DC | PRN

## 2017-04-03 MED ORDER — FLUOXETINE HCL 20 MG PO TABS: 80.0000 mg | ORAL_TABLET | Freq: Every day | ORAL | 2 refills | Status: DC

## 2017-04-03 MED ORDER — DIVALPROEX SODIUM ER 500 MG PO TB24: 1500.0000 mg | ORAL_TABLET | Freq: Every day | ORAL | 2 refills | Status: DC

## 2017-04-03 MED ORDER — ZOLPIDEM TARTRATE 5 MG PO TABS: 5.0000 mg | ORAL_TABLET | Freq: Every day | ORAL | 2 refills | Status: DC

## 2017-04-03 MED ORDER — TRAZODONE HCL 50 MG PO TABS: ORAL_TABLET | ORAL | 2 refills | Status: DC

## 2017-04-03 MED ORDER — QUETIAPINE FUMARATE 200 MG PO TABS: 200.0000 mg | ORAL_TABLET | Freq: Every day | ORAL | 2 refills | Status: DC

## 2017-04-03 MED ORDER — LAMOTRIGINE 25 MG PO TABS
25.0000 mg | ORAL_TABLET | Freq: Every day | ORAL | 2 refills | Status: DC
Start: 2017-04-03 — End: 2017-07-02

## 2017-04-03 MED FILL — QUETIAPINE FUMARATE 200 MG: 200 | 30 days supply | Qty: 30 | Fill #0

## 2017-04-03 MED FILL — FLUoxetine HCL 20 MG TABS: 20 | 30 days supply | Qty: 120 | Fill #0

## 2017-04-03 MED FILL — DIVALPROEX SOD ER 500 MG TA: 500 | 30 days supply | Qty: 90 | Fill #0

## 2017-04-03 MED FILL — QUETIAPINE FUMARATE 100 MG: 100 | 30 days supply | Qty: 60 | Fill #0

## 2017-04-03 NOTE — Patient Instructions (Signed)
1. Increase Depakote 1500 mg at night  2. Obtain blood test one week after increase up depakote 3. Continue fluoxetine 80 mg daily  4. Continue lamotrigine 25 mg daily  5. Continue quetiapine 100 mg twice a day and 200 mg at night 6. Continue ativan 1 mg three times a day s needed for anxiety 7. Continue Ambien 5 mg at night as needed for sleep  8. Continue Trazodone 25-50 mg at night as needed for sleep 9. Return to clinic in three months for 30 mins

## 2017-04-09 MED FILL — DIVALPROEX SOD ER 250 MG TA: 250 | 30 days supply | Qty: 150 | Fill #2

## 2017-04-09 MED FILL — LORazepam 1 MG TABS: 1 | 30 days supply | Qty: 90 | Fill #0

## 2017-04-20 MED FILL — ZOLPIDEM TARTRATE 5 MG TAB: 5 | 30 days supply | Qty: 30 | Fill #0 | Status: TO

## 2017-04-20 MED FILL — lamoTRIgine 25 MG TABS: 25 | 30 days supply | Qty: 30 | Fill #0 | Status: TO

## 2017-04-27 MED FILL — QUETIAPINE FUMARATE 100 MG: 100 | 30 days supply | Qty: 60 | Fill #1

## 2017-04-27 MED FILL — QUETIAPINE FUMARATE 200 MG: 200 | 30 days supply | Qty: 30 | Fill #2

## 2017-05-09 MED FILL — LORazepam 1 MG TABS: 1 | 30 days supply | Qty: 90 | Fill #1

## 2017-05-10 ENCOUNTER — Other Ambulatory Visit: Payer: Self-pay | Admitting: Psychiatry

## 2017-05-10 MED FILL — traZODone HCL 50 MG TABS: 50 | 30 days supply | Qty: 30 | Fill #0

## 2017-05-29 MED FILL — FLUoxetine HCL 20 MG TABS: 20 | 30 days supply | Qty: 120 | Fill #1

## 2017-05-29 MED FILL — QUETIAPINE FUMARATE 100 MG: 100 | 30 days supply | Qty: 60 | Fill #2

## 2017-05-29 MED FILL — lamoTRIgine 25 MG TABS: 25 | 30 days supply | Qty: 30 | Fill #0

## 2017-05-29 MED FILL — DIVALPROEX SOD ER 500 MG TA: 500 | 30 days supply | Qty: 90 | Fill #1

## 2017-05-29 MED FILL — ZOLPIDEM TARTRATE 5 MG TABL: 5 | 30 days supply | Qty: 30 | Fill #0

## 2017-06-15 MED FILL — QUETIAPINE FUMARATE 200 MG: 200 | 30 days supply | Qty: 30 | Fill #1

## 2017-06-15 MED FILL — LORazepam 1 MG TABS: 1 | 30 days supply | Qty: 90 | Fill #2

## 2017-06-26 MED FILL — DIVALPROEX SOD ER 500 MG TA: 500 | 30 days supply | Qty: 90 | Fill #2

## 2017-06-26 MED FILL — lamoTRIgine 25 MG TABS: 25 | 30 days supply | Qty: 30 | Fill #1

## 2017-06-26 MED FILL — ZOLPIDEM TARTRATE 5 MG TABL: 5 | 30 days supply | Qty: 30 | Fill #1

## 2017-06-26 MED FILL — traZODone HCL 50 MG TABS: 50 | 30 days supply | Qty: 30 | Fill #1

## 2017-06-26 MED FILL — FLUoxetine HCL 20 MG TABS: 20 | 30 days supply | Qty: 120 | Fill #2

## 2017-06-28 NOTE — Progress Notes (Signed)
BH MD/PA/NP OP Progress Note  07/02/2017 11:59 AM Ashley Franklin  MRN:  [id number]  Chief Complaint:  Chief Complaint    Manic Behavior; Follow-up; Anxiety     HPI:  Patient presents for follow-up appointment for bipolar disorder.  She states that she feels she is back to herself after a long time. She is going out more with her husband and her family. She does not cancel those as she used to. Although there is a moment she might get irritable, she is more receptive and is able to conceptualize what is happening around her.  She is more open to listen to the feedback from her husband.  There was a week of feeling of depressed, "don't care," and had poor hygiene. She was out of work during that period. It gradually resolved and she has not felt that way since December. She has been able to ask help if needed at work, rather than keep it to herself. She tries to take ativan minimal as possible as she does not want to have it as "kludge."   She occasionally feels confused when she wakes up in the morning.  She has fair sleep, although she used to sleep better with higher dose of Ambien.  She eats healthier food as her husband went through bypass surgery.  She has fair concentration.  She has more motivation and energy.  She denies SI.  She feels anxious and tense at times.  She denies panic attacks.  She denies decreased need for sleep or euphoria.  She denies impulsive episode.  She complains of short-term memory loss, although she denies any worsening since the last appointment.   Wt Readings from Last 3 Encounters:  07/02/17 223 lb (101.2 kg)  04/03/17 235 lb (106.6 kg)  03/19/17 233 lb 11.2 oz (106 kg)   Per PMP,  Lorazepam filled on 06/15/2017 Ambien filled on 06/26/2017 I have utilized the [place] Controlled Substances Reporting System (PMP AWARxE) to confirm adherence regarding the patient's medication. My review reveals appropriate prescription fills.   Visit Diagnosis:    ICD-10-CM   1.  Bipolar affective disorder, currently depressed, mild (HCC) F31.31 zolpidem (AMBIEN) 5 MG tablet    QUEtiapine (SEROQUEL) 200 MG tablet    FLUoxetine (PROZAC) 20 MG tablet    Past Psychiatric History:  I have reviewed the patient's psychiatry history in detail and updated the patient record. Outpatient: [facility]. Previously she was seen at [facility] in [place]. She was diagnosed with borderline personality disorder. She takes antidepressant since 2005.  Psychiatry admission: one admission in March 2015 for depression and SI Previous suicide attempt: denies Past trials of medication: Lexapro, Paxil, amitriptyline, Abilify (irritable), oxcarbazepine, perphenazine, Trifluoperazine, Klonopin, Valium. Vistaril,  History of violence: denies    Past Medical History:  Past Medical History:  Diagnosis Date  . Anemia   . Anxiety   . Bipolar affective disorder (HCC)   . Blood transfusion without reported diagnosis   . Bronchospasm with bronchitis, acute   . Depression   . Kidney stone   . SBO (small bowel obstruction) (HCC) 01/2014    Past Surgical History:  Procedure Laterality Date  . CESAREAN SECTION    . GASTRIC BYPASS  2001  . TUBAL LIGATION    . uterine ablasion      Family Psychiatric History: I have reviewed the patient's family history in detail and updated the patient record.  Family History:  Family History  Problem Relation Age of Onset  . Diabetes Mother   . Cancer Mother 60       Throat   . Bipolar disorder Mother   . Bipolar disorder Sister   . Bipolar disorder Brother   . Drug abuse Brother   . Bipolar disorder Sister     Social History:  Social History   Socioeconomic History  . Marital status: Single    Spouse name: None  . Number of children: None  . Years of education: None  . Highest education level: None  Social Needs  . Financial resource strain: None  . Food insecurity - worry: None   . Food insecurity - inability: None  . Transportation needs - medical: None  . Transportation needs - non-medical: None  Occupational History  . Occupation: phelbotomist    Employer: [employer]  Tobacco Use  . Smoking status: Current Every Day Smoker    Packs/day: 0.50    Years: 6.00    Pack years: 3.00    Last attempt to quit: 05/11/2014    Years since quitting: 3.1  . Smokeless tobacco: Never Used  Substance and Sexual Activity  . Alcohol use: No    Alcohol/week: 0.0 oz  . Drug use: No  . Sexual activity: Yes    Partners: Male    Birth control/protection: Surgical  Other Topics Concern  . None  Social History Narrative  . None    Allergies:  Allergies  Allergen Reactions  . Nsaids Other (See Comments)    Contraindicated with gastric bypass    Metabolic Disorder Labs: Lab Results  Component Value Date   HGBA1C 4.9 02/06/2017   MPG 105 02/26/2015   No results found for: PROLACTIN Lab Results  Component Value Date   CHOL 165 02/06/2017   TRIG 87 02/06/2017   HDL 54 02/06/2017   LDLCALC 94 02/06/2017   Lab Results  Component Value Date   TSH 1.22 06/08/2016   TSH 0.74 06/25/2015    Therapeutic Level Labs: No results found for: LITHIUM Lab Results  Component Value Date   VALPROATE 43 (L) 02/06/2017   VALPROATE 33 (L) 05/26/2016   No components found for:  CBMZ  Current Medications: Current Outpatient Medications  Medication Sig Dispense Refill  . divalproex (DEPAKOTE ER) 500 MG 24 hr tablet Take 3 tablets (1,500 mg total) at bedtime by mouth. 90 tablet 2  . FLUoxetine (PROZAC) 20 MG tablet Take 4 tablets (80 mg total) by mouth daily. 120 tablet 2  . lamoTRIgine (LAMICTAL) 25 MG tablet Take 1 tablet (25 mg total) by mouth daily. 30 tablet 2  . LORazepam (ATIVAN) 1 MG tablet Take 1 tablet (1 mg total) by mouth 3 (three) times daily as needed for anxiety. 90 tablet 2  . QUEtiapine (SEROQUEL) 100 MG tablet Take 1 tablet (100 mg total) by mouth 2 (two)  times daily. 60 tablet 2  . QUEtiapine (SEROQUEL) 200 MG tablet Take 1 tablet (200 mg total) by mouth at bedtime. 30 tablet 2  . traZODone (DESYREL) 50 MG tablet 25-50 mg at night as needed for sleep 30 tablet 2  . zolpidem (AMBIEN) 5 MG tablet Take 1 tablet (5 mg total) by mouth at bedtime. 30 tablet 2  . valACYclovir (VALTREX) 1000 MG tablet Take 1,000 mg by mouth as needed.      No current facility-administered medications for this visit.      Musculoskeletal: Strength & Muscle Tone: within normal limits Gait & Station:  normal Patient leans: N/A  Psychiatric Specialty Exam: Review of Systems  Psychiatric/Behavioral: Positive for depression. Negative for hallucinations, memory loss, substance abuse and suicidal ideas. The patient is nervous/anxious. The patient does not have insomnia.   All other systems reviewed and are negative.   Blood pressure 103/71, pulse 75, height 5\' 3"  (1.6 m), weight 223 lb (101.2 kg), SpO2 97 %.Body mass index is 39.5 kg/m.  General Appearance: Fairly Groomed  Eye Contact:  Good  Speech:  Clear and Coherent  Volume:  Normal  Mood:  "good"  Affect:  Appropriate, Congruent and Full Range  Thought Process:  Coherent and Goal Directed  Orientation:  Full (Time, Place, and Person)  Thought Content: Logical   Suicidal Thoughts:  No  Homicidal Thoughts:  No  Memory:  Immediate;   Good Recent;   Good Remote;   Good  Judgement:  Good  Insight:  Fair  Psychomotor Activity:  Normal  Concentration:  Concentration: Good and Attention Span: Good  Recall:  Good  Fund of Knowledge: Good  Language: Good  Akathisia:  No  Handed:  Right  AIMS (if indicated): not done  Assets:  Communication Skills Desire for Improvement  ADL's:  Intact  Cognition: WNL  Sleep:  Fair   Screenings: PHQ2-9     Nutrition from 03/06/2017 in [facility] Visit from 06/08/2016 in [facility]  Visit from 03/15/2015 in [facility] Office Visit from 01/12/2015 in [facility]  PHQ-2 Total Score  0  0  0  0  PHQ-9 Total Score  No data  2  No data  No data       Assessment and Plan:  YAILEEN HOFFERBER is a 47 y.o. year old female with a history of bipolar II disorder, anxiety, anemia, obesity s/p gastric bypass surgery , who presents for follow up appointment for Bipolar affective disorder, currently depressed, mild (HCC) - Plan: zolpidem (AMBIEN) 5 MG tablet, QUEtiapine (SEROQUEL) 200 MG tablet, FLUoxetine (PROZAC) 20 MG tablet  # Bipolar II disorder, mixed # unspecified anxiety disorder There has been significant improvement in irritability and mood symptoms since up titration of Depakote.  Given patient's strong preference to stay on current regimen, will continue the medication as follows:  will continue Depakote to target mood dysregulation and bipolar 2 disorder.  She is advised to contact the lab to forward the results to our office.  Will continue lamotrigine for bipolar disorder.  Will continue fluoxetine for depression.  Will continue quetiapine for mood dysregulation.  Discussed metabolic side effect.  Will continue Ativan as needed for anxiety.  Will continue Ambien and trazodone as needed for insomnia.  Discussed behavioral activation.   Plan: I have reviewed and updated plans as below 1. Continue Depakote 1500 mg at night  2. Please asks the lab to forward the result to our office (no record in the chart) 3. Continue fluoxetine 80 mg daily  4. Continue lamotrigine 25 mg daily  5. Continue quetiapine 100 mg twice a day and 200 mg at night 6. Continue ativan 1 mg three times a day as needed for anxiety 7. ContinueAmbien5 mg at night as needed for sleep  8. Continue Trazodone 25-50 mg at night as needed for sleep 9.Return to clinic inthree monthsfor 30 mins   The patient demonstrates the following risk  factors for suicide: Chronic risk factorsfor suicide include psychiatric disorder /bipolar disorder,previous self-harm  of scratching herself. Acute risk factorsfor suicide includenone. Protective factorsfor this patient include positive social support, positive therapeutic relationship,hope for the future. Considering these factors, the overall suicide risk at this point appears to be low. Patient does have gun access at home, which she declined to lock. Discussed in detail safety plan that anytime having active suicidal thoughts or homicidal thoughts and she need to call 911 or go to local emergency room.   The duration of this appointment visit was 30 minutes of face-to-face time with the patient.  Greater than 50% of this time was spent in counseling, explanation of  diagnosis, planning of further management, and coordination of care.  Neysa Hottereina Ifrah Vest, MD 07/02/2017, 11:59 AM

## 2017-07-02 ENCOUNTER — Encounter (HOSPITAL_COMMUNITY): Payer: Self-pay | Admitting: Psychiatry

## 2017-07-02 ENCOUNTER — Ambulatory Visit (HOSPITAL_COMMUNITY): Payer: Self-pay | Admitting: Psychiatry

## 2017-07-02 VITALS — BP 103/71 | HR 75 | Ht 63.0 in | Wt 223.0 lb

## 2017-07-02 DIAGNOSIS — F419 Anxiety disorder, unspecified: Secondary | ICD-10-CM

## 2017-07-02 DIAGNOSIS — F17210 Nicotine dependence, cigarettes, uncomplicated: Secondary | ICD-10-CM

## 2017-07-02 DIAGNOSIS — Z818 Family history of other mental and behavioral disorders: Secondary | ICD-10-CM

## 2017-07-02 DIAGNOSIS — E669 Obesity, unspecified: Secondary | ICD-10-CM

## 2017-07-02 DIAGNOSIS — Z79899 Other long term (current) drug therapy: Secondary | ICD-10-CM

## 2017-07-02 DIAGNOSIS — F3131 Bipolar disorder, current episode depressed, mild: Secondary | ICD-10-CM

## 2017-07-02 DIAGNOSIS — Z9884 Bariatric surgery status: Secondary | ICD-10-CM

## 2017-07-02 DIAGNOSIS — Z813 Family history of other psychoactive substance abuse and dependence: Secondary | ICD-10-CM

## 2017-07-02 DIAGNOSIS — Z6839 Body mass index (BMI) 39.0-39.9, adult: Secondary | ICD-10-CM

## 2017-07-02 DIAGNOSIS — D649 Anemia, unspecified: Secondary | ICD-10-CM

## 2017-07-02 MED ORDER — FLUOXETINE HCL 20 MG PO TABS: 80.0000 mg | ORAL_TABLET | Freq: Every day | ORAL | 2 refills | Status: DC

## 2017-07-02 MED ORDER — LORAZEPAM 1 MG PO TABS: 1.0000 mg | ORAL_TABLET | Freq: Three times a day (TID) | ORAL | 2 refills | Status: DC | PRN

## 2017-07-02 MED ORDER — ZOLPIDEM TARTRATE 5 MG PO TABS: 5.0000 mg | ORAL_TABLET | Freq: Every day | ORAL | 2 refills | Status: DC

## 2017-07-02 MED ORDER — QUETIAPINE FUMARATE 200 MG PO TABS: 200.0000 mg | ORAL_TABLET | Freq: Every day | ORAL | 2 refills | Status: DC

## 2017-07-02 MED ORDER — LAMOTRIGINE 25 MG PO TABS: 25.0000 mg | ORAL_TABLET | Freq: Every day | ORAL | 2 refills | Status: DC

## 2017-07-02 MED ORDER — QUETIAPINE FUMARATE 100 MG PO TABS: 100.0000 mg | ORAL_TABLET | Freq: Two times a day (BID) | ORAL | 2 refills | Status: DC

## 2017-07-02 MED ORDER — TRAZODONE HCL 50 MG PO TABS: ORAL_TABLET | ORAL | 2 refills | Status: DC

## 2017-07-02 NOTE — Patient Instructions (Signed)
1. [place] Depakote 1500 mg at night  2. Please asks the lab to forward the result to our office (Fax [phone]) 3. Continue fluoxetine 80 mg daily  4. Continue lamotrigine 25 mg daily  5. Continue quetiapine 100 mg twice a day and 200 mg at night 6. Continue ativan 1 mg three times a day s needed for anxiety 7. ContinueAmbien5 mg at night as needed for sleep  8. Continue Trazodone 25-50 mg at night as needed for sleep 9.Return to clinic inthree monthsfor 30 mins

## 2017-07-04 ENCOUNTER — Telehealth: Payer: Self-pay

## 2017-07-04 NOTE — Telephone Encounter (Signed)
Called pt and left message for pt to call us back to set up new pt/transfer appt.

## 2017-07-04 NOTE — Telephone Encounter (Signed)
Ok with me 

## 2017-07-04 NOTE — Telephone Encounter (Signed)
Ok w/ me , thx 

## 2017-07-04 NOTE — Telephone Encounter (Signed)
[facility] Drue NovelPaz. Please advise.

## 2017-07-04 NOTE — Telephone Encounter (Signed)
Copied from CRM [phone]. Topic: Appointment Scheduling - Scheduling Inquiry for Clinic >> Jul 04, 2017 10:26 AM Arlyss Gandyichardson, Taren N, NT wrote: Reason for CRM: Pt states that her husband Nena JordanSean Mom established care with Dr. Drue NovelPaz in December and she is wanting to see if she can establish care with Dr. Drue NovelPaz as well. Please call pt to schedule if approved. CB#: [phone]

## 2017-07-04 NOTE — Telephone Encounter (Signed)
Okay to [facility] [employer] [facility]. Thank you.

## 2017-07-05 NOTE — Telephone Encounter (Signed)
Notes   Margarito Courseralacios Medina, Teresa D 07/05/2017 01:44 PM  Summary: new patient appt.    Patient schedule NP appt with Dr. Drue NovelPaz for 07/10/17 in the p.m.

## 2017-07-10 ENCOUNTER — Encounter: Payer: Self-pay | Admitting: Internal Medicine

## 2017-07-10 ENCOUNTER — Ambulatory Visit (INDEPENDENT_AMBULATORY_CARE_PROVIDER_SITE_OTHER): Payer: No Typology Code available for payment source | Admitting: Internal Medicine

## 2017-07-10 VITALS — BP 126/74 | HR 64 | Temp 98.1°F | Resp 14 | Ht 63.0 in | Wt 225.0 lb

## 2017-07-10 DIAGNOSIS — F17200 Nicotine dependence, unspecified, uncomplicated: Secondary | ICD-10-CM | POA: Diagnosis not present

## 2017-07-10 DIAGNOSIS — K5909 Other constipation: Secondary | ICD-10-CM | POA: Diagnosis not present

## 2017-07-10 DIAGNOSIS — Z09 Encounter for follow-up examination after completed treatment for conditions other than malignant neoplasm: Secondary | ICD-10-CM

## 2017-07-10 MED ORDER — VARENICLINE TARTRATE 0.5 MG X 11 & 1 MG X 42 PO MISC: ORAL | 0 refills | Status: DC

## 2017-07-10 MED ORDER — LACTULOSE 10 GM/15ML PO SOLN: 10.0000 g | Freq: Two times a day (BID) | ORAL | 0 refills | Status: DC | PRN

## 2017-07-10 MED ORDER — VARENICLINE TARTRATE 1 MG PO TABS: 1.0000 mg | ORAL_TABLET | Freq: Two times a day (BID) | ORAL | 2 refills | Status: DC

## 2017-07-10 MED FILL — CHANTIX STARTING MONTH BOX: 0.5 MG X 11 | 30 days supply | Qty: 53 | Fill #0

## 2017-07-10 MED FILL — GENERLAC 10 GM/15 ML SOLN: 10 | 16 days supply | Qty: 473 | Fill #0

## 2017-07-10 NOTE — Progress Notes (Signed)
Pre visit review using our clinic review tool, if applicable. No additional management support is needed unless otherwise documented below in the visit note. 

## 2017-07-10 NOTE — Progress Notes (Addendum)
Subjective:    Patient ID: Ashley Franklin, female    DOB: 12-Dec-1970, 47 y.o.   MRN: [id number]  DOS:  07/10/2017 Type of visit - description : New patient, transferring care. Interval history: Ashley Franklin is a new patient to me, chart is reviewed History of constipation, for at least 2 years, BMs are every 7-10 days, very painful and large BMs.  Stools are sometimes dark and she has  seen red blood around the stools sometimes. She has tried a number  of over-the-counter medications including MiraLAX, Ex-Lax etc. With poor results.  Also, likes to quit tobacco, smokes.  Use the nicotime patch many times without being successful, she did take her husband's Chantix for few days and experienced no side effects and he she felt like she could quit.  Rx for Chantix?Marland Kitchen. She sees her psychiatrist regularly, takes a number of medicines, psychiatry agreed with Chantix but recommend to be prescribed by PCP.  Review of Systems  Denies nausea, vomiting.  No upper abdominal symptoms.   Past Medical History:  Diagnosis Date  . Anemia   . Anxiety   . Bipolar affective disorder (HCC)   . Blood transfusion without reported diagnosis   . Bronchospasm with bronchitis, acute   . Depression   . Kidney stone   . SBO (small bowel obstruction) (HCC) 01/2014    Past Surgical History:  Procedure Laterality Date  . CESAREAN SECTION    . GASTRIC BYPASS  2001  . TUBAL LIGATION    . uterine ablasion      Social History   Socioeconomic History  . Marital status: Single    Spouse name: Not on file  . Number of children: Not on file  . Years of education: Not on file  . Highest education level: Not on file  Social Needs  . Financial resource strain: Not on file  . Food insecurity - worry: Not on file  . Food insecurity - inability: Not on file  . Transportation needs - medical: Not on file  . Transportation needs - non-medical: Not on file  Occupational History  . Occupation: phelbotomist   Employer: [employer]  Tobacco Use  . Smoking status: Current Every Day Smoker    Packs/day: 0.50    Years: 6.00    Pack years: 3.00    Last attempt to quit: 05/11/2014    Years since quitting: 3.1  . Smokeless tobacco: Never Used  Substance and Sexual Activity  . Alcohol use: No    Alcohol/week: 0.0 oz  . Drug use: No  . Sexual activity: Yes    Partners: Male    Birth control/protection: Surgical  Other Topics Concern  . Not on file  Social History Narrative  . Not on file      Allergies as of 07/10/2017      Reactions   Nsaids Other (See Comments)   Contraindicated with gastric bypass      Medication List        Accurate as of 07/10/17  2:42 PM. Always use your most recent med list.          divalproex 500 MG 24 hr tablet Commonly known as:  DEPAKOTE ER Take 3 tablets (1,500 mg total) at bedtime by mouth.   FLUoxetine 20 MG tablet Commonly known as:  PROZAC Take 4 tablets (80 mg total) by mouth daily.   lamoTRIgine 25 MG tablet Commonly known as:  LAMICTAL Take 1 tablet (25 mg total) by mouth daily.  LORazepam 1 MG tablet Commonly known as:  ATIVAN Take 1 tablet (1 mg total) by mouth 3 (three) times daily as needed for anxiety.   QUEtiapine 200 MG tablet Commonly known as:  SEROQUEL Take 1 tablet (200 mg total) by mouth at bedtime.   QUEtiapine 100 MG tablet Commonly known as:  SEROQUEL Take 1 tablet (100 mg total) by mouth 2 (two) times daily.   traZODone 50 MG tablet Commonly known as:  DESYREL 25-50 mg at night as needed for sleep   valACYclovir 1000 MG tablet Commonly known as:  VALTREX Take 1,000 mg by mouth as needed.   zolpidem 5 MG tablet Commonly known as:  AMBIEN Take 1 tablet (5 mg total) by mouth at bedtime.          Objective:   Physical Exam BP 126/74 (BP Location: Left Arm, Patient Position: Sitting, Cuff Size: Normal)   Pulse 64   Temp 98.1 F (36.7 C) (Oral)   Resp 14   Ht 5\' 3"  (1.6 m)   Wt 225 lb (102.1 kg)    SpO2 96%   BMI 39.86 kg/m  General:   Well developed, well nourished . NAD.  HEENT:  Normocephalic . Face symmetric, atraumatic Neck: No thyromegaly Lungs:  CTA B Normal respiratory effort, no intercostal retractions, no accessory muscle use. Heart: RRR,  no murmur.  no pretibial edema bilaterally  Abdomen:  Not distended, soft, non-tender. No rebound or rigidity.   Skin: Not pale. Not jaundice Neurologic:  alert & oriented X3.  Speech normal, gait appropriate for age and unassisted Psych--  Cognition and judgment appear intact.  Cooperative with normal attention span and concentration.  Behavior appropriate. No anxious or depressed appearing.     Assessment & Plan:   Assesment Bipolar, anxiety: per psych Dr Vanetta Shawl H/o kidney stones H/o SBO ~ 2015  Plan: Chronic constipation:  patient has a history of remote bariatric surgery, a SBO around 2015, s/p laparoscopy intervention ([place]) with resolution of symptoms. She presents with constipation for 2 years not responding to OTCs.  Occasionally red blood per rectum  Recent labs indicates no anemia.  Last TSH a year ago. Rec MiraLAX daily, lactulose as needed, goal to  increase BMs to every 3 or 4 days at least. Never had a colonoscopy, refer to GI for further eval and treatment Bipolar, anxiety: Seems well controlled, take multiple medications Tobacco abuse: Patient is ready to quit, her psychiatrist advised her that it was okay to take Chantix in conjunction with all the other medications but PCP needed to prescribe.  I reviewed interactions of Chantix with her psych meds through Up-to-date and there is none.  Rx for Chantix provided, how to use it discussed.  Encourage reading material associated with this medication. If she feels that med is helping and needs a extra month she is to let me know. RTC 6 months

## 2017-07-10 NOTE — Patient Instructions (Addendum)
GO TO THE LAB : Get the blood work     GO TO THE FRONT DESK Schedule your next appointment for a checkup in 6 months  For constipation MiraLAX 17 g every day Good hydration, high-fiber diet Lactulose twice a day as needed  Start Chantix Quit 2 weeks later You can take Chantix for a total of 3 months

## 2017-07-11 DIAGNOSIS — Z09 Encounter for follow-up examination after completed treatment for conditions other than malignant neoplasm: Secondary | ICD-10-CM | POA: Insufficient documentation

## 2017-07-11 LAB — TSH: TSH: 1.12 u[IU]/mL (ref 0.35–4.50)

## 2017-07-11 NOTE — Assessment & Plan Note (Addendum)
Chronic constipation:  patient has a history of remote bariatric surgery, a SBO around 2015, s/p laparoscopy intervention [place]) with resolution of symptoms. She presents with constipation for 2 years not responding to OTCs.  Occasionally red blood per rectum  Recent labs indicates no anemia.  Last TSH a year ago.  Rec MiraLAX daily, lactulose as needed, goal to  increase BMs to every 3 or 4 days at least. Never had a colonoscopy, refer to GI for further eval and treatment Bipolar, anxiety: Seems well controlled, take multiple medications Tobacco abuse: Patient is ready to quit, her psychiatrist advised her that it was okay to take Chantix in conjunction with all the other medications but PCP needed to prescribe.  I reviewed interactions of Chantix with her psych meds through Up-to-date and there is none.  Rx for Chantix provided, how to use it discussed.  Encourage reading material associated with this medication. If she feels that med is helping and needs a extra month she is to let me know. RTC 6 months

## 2017-07-17 MED FILL — QUETIAPINE FUMARATE 200 MG: 200 | 30 days supply | Qty: 30 | Fill #2

## 2017-07-31 MED FILL — traZODone HCL 50 MG TABS: 50 | 30 days supply | Qty: 30 | Fill #2

## 2017-07-31 MED FILL — ZOLPIDEM TARTRATE 5 MG TABL: 5 | 30 days supply | Qty: 30 | Fill #0

## 2017-08-01 ENCOUNTER — Ambulatory Visit (HOSPITAL_BASED_OUTPATIENT_CLINIC_OR_DEPARTMENT_OTHER)
Admission: RE | Admit: 2017-08-01 | Discharge: 2017-08-01 | Disposition: A | Discharge status: Home / Self Care | Payer: No Typology Code available for payment source | Source: Ambulatory Visit | Attending: Medical | Admitting: Medical

## 2017-08-01 ENCOUNTER — Ambulatory Visit (INDEPENDENT_AMBULATORY_CARE_PROVIDER_SITE_OTHER): Payer: No Typology Code available for payment source | Admitting: Medical

## 2017-08-01 ENCOUNTER — Encounter: Payer: Self-pay | Admitting: Medical

## 2017-08-01 VITALS — BP 106/64 | HR 84 | Temp 98.0°F | Resp 16 | Ht 63.0 in | Wt 222.6 lb

## 2017-08-01 DIAGNOSIS — M25572 Pain in left ankle and joints of left foot: Secondary | ICD-10-CM | POA: Diagnosis not present

## 2017-08-01 DIAGNOSIS — M79672 Pain in left foot: Secondary | ICD-10-CM

## 2017-08-01 DIAGNOSIS — W19XXXA Unspecified fall, initial encounter: Secondary | ICD-10-CM | POA: Insufficient documentation

## 2017-08-01 DIAGNOSIS — M79605 Pain in left leg: Secondary | ICD-10-CM

## 2017-08-01 DIAGNOSIS — M7989 Other specified soft tissue disorders: Secondary | ICD-10-CM | POA: Diagnosis not present

## 2017-08-01 MED ORDER — TRAMADOL HCL 50 MG PO TABS: 50.0000 mg | ORAL_TABLET | Freq: Three times a day (TID) | ORAL | 0 refills | Status: DC | PRN

## 2017-08-01 MED FILL — traMADol HCL 50 MG TABS: 50 | 4 days supply | Qty: 12 | Fill #0

## 2017-08-01 NOTE — Progress Notes (Signed)
Subjective:    Patient ID: Ashley Franklin, female    DOB: 1971-02-03, 47 y.o.   MRN: [id number]  HPI   Pt in for some left ankle and left foot pain. Some distal fibula area pain as well. She fell at 4 pm yesterday. Pain intially but gradually got worse since yesterday. Hurst to lay on her left side.   State injury at home rushing to get out of house.She landed on hardwood.  Pt work at [employer] 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   Review of Systems  Constitutional: Negative for chills, fatigue and fever.  Respiratory: Negative for chest tightness, shortness of breath and wheezing.   Cardiovascular: Negative for chest pain and palpitations.  Musculoskeletal: Negative for back pain and neck pain.       Left lower ext pain. See hpi.  Skin: Negative for rash.  Psychiatric/Behavioral: Negative for behavioral problems, confusion and sleep disturbance. The patient is not nervous/anxious.     Past Medical History:  Diagnosis Date  . Anemia   . Anxiety   . Bipolar affective disorder (HCC)   . Blood transfusion without reported diagnosis   . Bronchospasm with bronchitis, acute   . Depression   . Kidney stone   . SBO (small bowel obstruction) (HCC) 01/2014     Social History   Socioeconomic History  . Marital status: Married    Spouse name: Not on file  . Number of children: 2  . Years of education: Not on file  . Highest education level: Not on file  Social Needs  . Financial resource strain: Not on file  . Food insecurity - worry: Not on file  . Food insecurity - inability: Not on file  . Transportation needs - medical: Not on file  . Transportation needs - non-medical: Not on file  Occupational History  . Occupation: phelbotomist    Employer: [employer]  Tobacco Use  . Smoking status: Current Every Day Smoker    Packs/day: 0.50    Years: 6.00    Pack years: 3.00    Last attempt to quit: 05/11/2014    Years since quitting: 3.2  . Smokeless tobacco: Never  Used  . Tobacco comment: 1/2 ppd   Substance and Sexual Activity  . Alcohol use: No    Alcohol/week: 0.0 oz  . Drug use: No  . Sexual activity: Yes    Partners: Male    Birth control/protection: Surgical  Other Topics Concern  . Not on file  Social History Narrative   2 independent teachers    Pharmacy schools, music teacher    Past Surgical History:  Procedure Laterality Date  . CESAREAN SECTION    . GASTRIC BYPASS  2001  . SBO  2015  . TUBAL LIGATION    . uterine ablasion      Family History  Problem Relation Age of Onset  . Diabetes Mother   . Cancer Mother 60       Throat   . Bipolar disorder Mother   . Bipolar disorder Sister   . Bipolar disorder Brother   . Drug abuse Brother   . Bipolar disorder Sister   . Colon cancer Neg Hx   . Cancer - Prostate Neg Hx   . Breast cancer Neg Hx     Allergies  Allergen Reactions  . Nsaids Other (See Comments)    Contraindicated with gastric bypass    Current Outpatient Medications on File Prior to Visit  Medication  Sig Dispense Refill  . divalproex (DEPAKOTE ER) 500 MG 24 hr tablet Take 3 tablets (1,500 mg total) at bedtime by mouth. 90 tablet 2  . FLUoxetine (PROZAC) 20 MG tablet Take 4 tablets (80 mg total) by mouth daily. 120 tablet 2  . lactulose (CHRONULAC) 10 GM/15ML solution Take 15 mLs (10 g total) by mouth 2 (two) times daily as needed for mild constipation. 473 mL 0  . lamoTRIgine (LAMICTAL) 25 MG tablet Take 1 tablet (25 mg total) by mouth daily. 30 tablet 2  . LORazepam (ATIVAN) 1 MG tablet Take 1 tablet (1 mg total) by mouth 3 (three) times daily as needed for anxiety. 90 tablet 2  . QUEtiapine (SEROQUEL) 100 MG tablet Take 1 tablet (100 mg total) by mouth 2 (two) times daily. 60 tablet 2  . QUEtiapine (SEROQUEL) 200 MG tablet Take 1 tablet (200 mg total) by mouth at bedtime. 30 tablet 2  . traZODone (DESYREL) 50 MG tablet 25-50 mg at night as needed for sleep 30 tablet 2  . valACYclovir (VALTREX) 1000 MG  tablet Take 1,000 mg by mouth as needed.     . varenicline (CHANTIX CONTINUING MONTH PAK) 1 MG tablet Take 1 tablet (1 mg total) by mouth 2 (two) times daily. 60 tablet 2  . varenicline (CHANTIX STARTING MONTH PAK) 0.5 MG X 11 & 1 MG X 42 tablet Take one 0.5 mg tablet by mouth once daily for 3 days, then increase to one 0.5 mg tablet twice daily for 4 days, then increase to one 1 mg tablet twice daily. 53 tablet 0  . zolpidem (AMBIEN) 5 MG tablet Take 1 tablet (5 mg total) by mouth at bedtime. 30 tablet 2   No current facility-administered medications on file prior to visit.     BP 106/64   Pulse 84   Temp 98 F (36.7 C) (Oral)   Resp 16   Ht 5\' 3"  (1.6 m)   Wt 222 lb 9.6 oz (101 kg)   SpO2 99%   BMI 39.43 kg/m       Objective:   Physical Exam  General- No acute distress. Pleasant patient. Neck- Full range of motion, no jvd Lungs- Clear, even and unlabored. Heart- regular rate and rhythm. Neurologic- CNII- XII grossly intact.  Rt lower ext- distal fibula area pain on palpatoin and swelling. Rt ankle- tenderness to palpation. talofibluar region swelling. Decreased plantar and dorsiflesion. Rt foot- 5 th metatarsal area pain      Assessment & Plan:  Will get xray of left foot, left ankle and left tibia. Will evaluate if you have fracture  Work excuse until next Wednesday.  Will refer to sports medicine even if not fracture (appears will need likely post op type shoe for support even if no fracture)  Can use low dose ibuprofen as you can tolerate that. For more severe pain can use tramadol.  Follow up 7 days or as needed  Recommend come back up and wait for xray results.  Esperanza RichtersEdward Sebasthian Stailey, PA-C

## 2017-08-01 NOTE — Patient Instructions (Addendum)
Will get xray of left foot, left ankle and left tibia. Will evaluate if you have fracture  Work excuse until next Wednesday.  Will refer to sports medicine even if not fracture (appears will need likely post op type shoe for support even if no fracture)  Can use low dose ibuprofen as you can tolerate that. For more severe pain can use tramadol.  Follow up 7 days or as needed  Recommend come back up and wait for xray results.   No fracture seen on x-rays but due to patient's occupation and need to ambulate we will go ahead and refer to Dr. Pearletha ForgeHudnall.  He might be able to give her appropriate boot to lessen pain when she starts to walk a lot at work next week.

## 2017-08-02 ENCOUNTER — Encounter: Payer: Self-pay | Admitting: Family Medicine

## 2017-08-02 ENCOUNTER — Ambulatory Visit: Payer: No Typology Code available for payment source | Admitting: Family Medicine

## 2017-08-02 DIAGNOSIS — S99912A Unspecified injury of left ankle, initial encounter: Secondary | ICD-10-CM | POA: Diagnosis not present

## 2017-08-02 MED ORDER — HYDROCODONE-ACETAMINOPHEN 5-325 MG PO TABS: 1.0000 | ORAL_TABLET | Freq: Four times a day (QID) | ORAL | 0 refills | Status: DC | PRN

## 2017-08-02 MED FILL — HYDROCODON-APAP 5-325: 5-325 | 5 days supply | Qty: 20 | Fill #0

## 2017-08-02 NOTE — Patient Instructions (Signed)
You have a Grade 3 ankle sprain. Ice the area for 15 minutes at a time, 3-4 times a day Tylenol during the day 500mg  1-2 tabs as needed. Norco as needed for severe pain (don't take with tylenol as this has tylenol in it) - we don't refill this medication though. Elevate above the level of your heart when possible Crutches if needed to help with walking Bear weight when tolerated Use boot or ankle brace when up and walking around to help with stability while you recover from this injury. If you use the boot try to wear ace wrap under this to help with swelling. Come out of the boot/brace twice a day to do Up/down and alphabet exercises 2-3 sets of each. Consider physical therapy for strengthening and balance exercises in the future. If not improving as expected, we may repeat x-rays or consider further testing like an MRI. Follow up in 2 weeks.

## 2017-08-04 ENCOUNTER — Encounter: Payer: Self-pay | Admitting: Family Medicine

## 2017-08-04 DIAGNOSIS — S99912D Unspecified injury of left ankle, subsequent encounter: Secondary | ICD-10-CM | POA: Insufficient documentation

## 2017-08-04 NOTE — Progress Notes (Signed)
PCP: Wanda Plump, MD Consultation requested by: Esperanza Richters PA-C  Subjective:   HPI: Patient is a 47 y.o. female here for left ankle injury.  Patient reports on 3/19 she accidentally hyper plantarflexed and inverted her left foot and ankle rushing to get out of her house. She states she can bear weight after this but has been limping ever since. She continues to have 5 out of 10 level sharp pain that shoots laterally into her foot. She states her third through fifth toes are numb. Denies prior foot or ankle injuries. She has been taking tramadol.  She did not take NSAIDs due to gastric bypass. No skin changes.  Past Medical History:  Diagnosis Date  . Anemia   . Anxiety   . Bipolar affective disorder (HCC)   . Blood transfusion without reported diagnosis   . Bronchospasm with bronchitis, acute   . Depression   . Kidney stone   . SBO (small bowel obstruction) (HCC) 01/2014    Current Outpatient Medications on File Prior to Visit  Medication Sig Dispense Refill  . divalproex (DEPAKOTE ER) 500 MG 24 hr tablet Take 3 tablets (1,500 mg total) at bedtime by mouth. 90 tablet 2  . FLUoxetine (PROZAC) 20 MG tablet Take 4 tablets (80 mg total) by mouth daily. 120 tablet 2  . lactulose (CHRONULAC) 10 GM/15ML solution Take 15 mLs (10 g total) by mouth 2 (two) times daily as needed for mild constipation. 473 mL 0  . lamoTRIgine (LAMICTAL) 25 MG tablet Take 1 tablet (25 mg total) by mouth daily. 30 tablet 2  . LORazepam (ATIVAN) 1 MG tablet Take 1 tablet (1 mg total) by mouth 3 (three) times daily as needed for anxiety. 90 tablet 2  . QUEtiapine (SEROQUEL) 100 MG tablet Take 1 tablet (100 mg total) by mouth 2 (two) times daily. 60 tablet 2  . QUEtiapine (SEROQUEL) 200 MG tablet Take 1 tablet (200 mg total) by mouth at bedtime. 30 tablet 2  . traMADol (ULTRAM) 50 MG tablet Take 1 tablet (50 mg total) by mouth every 8 (eight) hours as needed. 12 tablet 0  . traZODone (DESYREL) 50 MG tablet  25-50 mg at night as needed for sleep 30 tablet 2  . valACYclovir (VALTREX) 1000 MG tablet Take 1,000 mg by mouth as needed.     . varenicline (CHANTIX CONTINUING MONTH PAK) 1 MG tablet Take 1 tablet (1 mg total) by mouth 2 (two) times daily. 60 tablet 2  . varenicline (CHANTIX STARTING MONTH PAK) 0.5 MG X 11 & 1 MG X 42 tablet Take one 0.5 mg tablet by mouth once daily for 3 days, then increase to one 0.5 mg tablet twice daily for 4 days, then increase to one 1 mg tablet twice daily. 53 tablet 0  . zolpidem (AMBIEN) 5 MG tablet Take 1 tablet (5 mg total) by mouth at bedtime. 30 tablet 2   No current facility-administered medications on file prior to visit.     Past Surgical History:  Procedure Laterality Date  . CESAREAN SECTION    . GASTRIC BYPASS  2001  . SBO  2015  . TUBAL LIGATION    . uterine ablasion      Allergies  Allergen Reactions  . Nsaids Other (See Comments)    Contraindicated with gastric bypass    Social History   Socioeconomic History  . Marital status: Married    Spouse name: Not on file  . Number of children: 2  . Years  of education: Not on file  . Highest education level: Not on file  Occupational History  . Occupation: phelbotomist    Employer: [employer]  Social Needs  . Financial resource strain: Not on file  . Food insecurity:    Worry: Not on file    Inability: Not on file  . Transportation needs:    Medical: Not on file    Non-medical: Not on file  Tobacco Use  . Smoking status: Current Every Day Smoker    Packs/day: 0.50    Years: 6.00    Pack years: 3.00    Last attempt to quit: 05/11/2014    Years since quitting: 3.2  . Smokeless tobacco: Never Used  . Tobacco comment: 1/2 ppd   Substance and Sexual Activity  . Alcohol use: No    Alcohol/week: 0.0 oz  . Drug use: No  . Sexual activity: Yes    Partners: Male    Birth control/protection: Surgical  Lifestyle  . Physical activity:    Days per week: Not on file    Minutes per  session: Not on file  . Stress: Not on file  Relationships  . Social connections:    Talks on phone: Not on file    Gets together: Not on file    Attends religious service: Not on file    Active member of club or organization: Not on file    Attends meetings of clubs or organizations: Not on file    Relationship status: Not on file  . Intimate partner violence:    Fear of current or ex partner: Not on file    Emotionally abused: Not on file    Physically abused: Not on file    Forced sexual activity: Not on file  Other Topics Concern  . Not on file  Social History Narrative   2 independent teachers    Pharmacy schools, music teacher    Family History  Problem Relation Age of Onset  . Diabetes Mother   . Cancer Mother 60       Throat   . Bipolar disorder Mother   . Bipolar disorder Sister   . Bipolar disorder Brother   . Drug abuse Brother   . Bipolar disorder Sister   . Colon cancer Neg Hx   . Cancer - Prostate Neg Hx   . Breast cancer Neg Hx     BP 102/67   Pulse 64   Ht 5\' 4"  (1.626 m)   Wt 220 lb (99.8 kg)   BMI 37.76 kg/m   Review of Systems: See HPI above.     Objective:  Physical Exam:  Gen: NAD, comfortable in exam room  Left ankle/foot: Mod swelling lateral ankle into foot.  No bruising, other deformity. ROM very limited all directions but able to do so. TTP greatest over ATFL but tender throughout dorsolateral foot including 5th metatarsal, peroneal tendons, lateral malleolus.  No lis franc, medial, navicular, other tenderness. 1+ ant drawer, guarding.  2+ talar tilt, painful. Negative syndesmotic compression. Thompsons test negative. NV intact distally.  Right ankle/foot: No deformity. FROM with 5/5 strength. No tenderness to palpation. NVI distally.   MSK u/s left foot/ankle: Effusion noted along with soft tissue swelling.  No cortical irregularities of distal fibula, fifth through third metatarsals.  No edema overlying the cortices of  these bones either.  Peroneal tendons are intact without tenosynovitis.  Talar dome also intact without avulsion.  Assessment & Plan:  1.  Left ankle injury: Independently  reviewed patient's radiographs and no evidence of fracture or other bony abnormalities.  Brief muscular skeletal ultrasound also reassuring and consistent with severe grade 3 lateral ankle sprain.  Icing, elevation.  Tylenol during the day with Norco as needed for severe pain at night.  She will start with a Cam walker with crutches as needed.  Instructed on motion exercises to start now.  She will follow-up in 2 weeks for reevaluation.  We will consider physical therapy in the future as well.

## 2017-08-04 NOTE — Assessment & Plan Note (Signed)
Independently reviewed patient's radiographs and no evidence of fracture or other bony abnormalities.  Brief muscular skeletal ultrasound also reassuring and consistent with severe grade 3 lateral ankle sprain.  Icing, elevation.  Tylenol during the day with Norco as needed for severe pain at night.  Ashley Franklin will start with a Cam walker with crutches as needed.  Instructed on motion exercises to start now.  Ashley Franklin will follow-up in 2 weeks for reevaluation.  We will consider physical therapy in the future as well.

## 2017-08-08 MED FILL — CHANTIX 1 MG TABLET: 1 | 28 days supply | Qty: 56 | Fill #0

## 2017-08-09 ENCOUNTER — Other Ambulatory Visit (HOSPITAL_COMMUNITY): Payer: Self-pay | Admitting: Psychiatry

## 2017-08-09 MED ORDER — DIVALPROEX SODIUM ER 500 MG PO TB24: 1500.0000 mg | ORAL_TABLET | Freq: Every day | ORAL | 2 refills | Status: DC

## 2017-08-09 MED FILL — DIVALPROEX SOD ER 500 MG TA: 500 | 30 days supply | Qty: 90 | Fill #0

## 2017-08-13 MED FILL — QUETIAPINE FUMARATE 100 MG: 100 | 30 days supply | Qty: 60 | Fill #0

## 2017-08-13 MED FILL — LORazepam 1 MG TABS: 1 | 30 days supply | Qty: 90 | Fill #0

## 2017-08-13 MED FILL — FLUoxetine HCL 20 MG TABS: 20 | 30 days supply | Qty: 120 | Fill #0

## 2017-08-13 MED FILL — lamoTRIgine 25 MG TABS: 25 | 30 days supply | Qty: 30 | Fill #0

## 2017-08-14 ENCOUNTER — Ambulatory Visit: Payer: No Typology Code available for payment source | Admitting: Internal Medicine

## 2017-08-14 ENCOUNTER — Encounter: Payer: Self-pay | Admitting: Internal Medicine

## 2017-08-14 VITALS — BP 98/60 | HR 68 | Ht 63.0 in | Wt 228.8 lb

## 2017-08-14 DIAGNOSIS — R194 Change in bowel habit: Secondary | ICD-10-CM

## 2017-08-14 DIAGNOSIS — K5909 Other constipation: Secondary | ICD-10-CM | POA: Diagnosis not present

## 2017-08-14 DIAGNOSIS — K625 Hemorrhage of anus and rectum: Secondary | ICD-10-CM

## 2017-08-14 NOTE — Progress Notes (Signed)
Ashley Franklin 47 y.o. 09/08/1970 [id number]  Referred by: Wanda Plump, MD [address] [place], [place] [address]  Assessment & Plan:   Encounter Diagnoses  Name Primary?  . Change in bowel habits Yes  . Rectal bleeding   . Chronic constipation     Seems like it slow transit problem most likely but with a constellation of problems which represented a change wlOf a chronic condition in the bleeding a colonoscopy is appropriate in her age group.The risks and benefits as well as alternatives of endoscopic procedure(s) have been discussed and reviewed. All questions answered. The patient agrees to proceed.  We will give her a MiraLAX purge, and a trial of Linzess 290 mcg daily.  Samples provided and she is to let me know the results of that trial of medication and I will make further recommendations.  Side effects discussed.   I appreciate the opportunity to care for this patient. 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 Windle Guard for evaluation of constipation which represents a change in bowel habits.  She has been chronically constipated with 1 or 2 stools a week in general without difficulty but sometime in the past 2 years a she is gone from 7-10 days without defecation on a regular basis with a very hard large stool afterwards is painful and causes some rectal bleeding, the pain is in the rectum and she gets some left back pain as the days go along with abdominal distention as well prior to defecation.  Then she will have a day or 2 of multiple bowel movements and reset the cycle.  She has tried daily MiraLAX she has tried lactulose and she has tried some stimulant laxatives and she is taking something on a daily basis now that the stimulant laxatives but she still has this problem.  It does represent a change.  She has never had a  colonoscopy.  She does not have significant urinary leakage or problems but does have some urination she denies any difficult childbirths and damage.  There are no close relatives with colon cancer though there might be a distant relative with that.  Labs in September of last year showed a normal CBC and normal metabolic panel a slightly low calcium at 8.5 along with a slightly low albumin so probably normal calcium, hemoglobin A1c was 4.9 her TSH was normal in February of this year.  GI review of systems is otherwise negative. Allergies  Allergen Reactions  . Nsaids Other (See Comments)    Contraindicated with gastric bypass   Current Meds  Medication Sig  . divalproex (DEPAKOTE ER) 500 MG 24 hr tablet Take 3 tablets (1,500 mg total) by mouth at bedtime.  Marland Kitchen FLUoxetine (PROZAC) 20 MG tablet Take 4 tablets (80 mg total) by mouth daily.  Marland Kitchen lamoTRIgine (LAMICTAL) 25 MG tablet Take 1 tablet (25 mg total) by mouth daily.  Marland Kitchen LORazepam (ATIVAN) 1 MG tablet Take 1 tablet (1 mg total) by mouth 3 (three) times daily as needed for anxiety.  Marland Kitchen QUEtiapine (SEROQUEL) 100 MG tablet Take 1 tablet (100 mg total) by mouth 2 (two) times daily.  . QUEtiapine (SEROQUEL) 200 MG tablet Take 1 tablet (200 mg total) by mouth at bedtime.  . traZODone (DESYREL) 50 MG tablet 25-50 mg at night as needed for sleep  . valACYclovir (VALTREX)  1000 MG tablet Take 1,000 mg by mouth as needed.   . varenicline (CHANTIX CONTINUING MONTH PAK) 1 MG tablet Take 1 tablet (1 mg total) by mouth 2 (two) times daily.  . varenicline (CHANTIX STARTING MONTH PAK) 0.5 MG X 11 & 1 MG X 42 tablet Take one 0.5 mg tablet by mouth once daily for 3 days, then increase to one 0.5 mg tablet twice daily for 4 days, then increase to one 1 mg tablet twice daily.  Marland Kitchen. zolpidem (AMBIEN) 5 MG tablet Take 1 tablet (5 mg total) by mouth at bedtime.  . [DISCONTINUED] lactulose (CHRONULAC) 10 GM/15ML solution Take 15 mLs (10 g total) by mouth 2 (two) times daily as  needed for mild constipation.   Past Medical History:  Diagnosis Date  . Anemia   . Anxiety   . Bipolar affective disorder (HCC)   . Blood transfusion without reported diagnosis   . Bronchospasm with bronchitis, acute   . Depression   . Kidney stone   . SBO (small bowel obstruction) (HCC) 01/2014   Past Surgical History:  Procedure Laterality Date  . CESAREAN SECTION    . ENDOMETRIAL ABLATION    . GASTRIC BYPASS  2001  . SBO  2015  . TUBAL LIGATION     Social History   Social History Narrative   The patient is a Water quality scientistphlebotomist at [employer]    1 son born 1988-08-13 daughter born in [id number] she is married and lives with her husband   Prior smoker not current 3 caffeinated beverages a day no alcohol tobacco or drug use      2 independent teachers    Pharmacy schools, music teacher   family history includes Bipolar disorder in her brother, mother, sister, and sister; Cancer (age of onset: 6460) in her mother; Diabetes in her mother; Drug abuse in her brother.   Review of Systems She has urinary urgency and some pain with menses all other review of systems are negative or as per HPI  Objective:   Physical Exam @BP  98/60   Pulse 68   Ht 5\' 3"  (1.6 m)   Wt 228 lb 12.8 oz (103.8 kg)   BMI 40.53 kg/m @  General:  Well-developed, well-nourished and in no acute distress Eyes:  anicteric. ENT:   Mouth and posterior pharynx free of lesions.  Neck:   supple w/o thyromegaly or mass.  Lungs: Clear to auscultation bilaterally. Heart:  S1S2, no rubs, murmurs, gallops. Abdomen:  soft, non-tender, no hepatosplenomegaly, hernia, or mass and BS+.  Rectal:  Morrie SheldonAshley LPN present  Normal anoderm and positive anal wink Normal resting involuntary tone, no mass there is formed firm brown stool present, with no rectocele and nontender Appropriate abdominal contraction and descent and relaxation with simulated defecation  Lymph:  no cervical or supraclavicular adenopathy. Extremities:   no  edema, cyanosis or clubbing Skin   no rash. Neuro:  A&O x 3.  Psych:  appropriate mood and  Affect.   Data Reviewed: See HPI I have reviewed primary care notes from this year as well as the labs. Upper GI series shows that her gastric pouch moves in and out of the thorax.  2018 exam by Dr. Andrey CampanileWilson.

## 2017-08-14 NOTE — Patient Instructions (Addendum)
You have been scheduled for a colonoscopy. Please follow written instructions given to you at your visit today.  Please pick up your prep supplies at the pharmacy. If you use inhalers (even only as needed), please bring them with you on the day of your procedure. Your physician has requested that you go to www.startemmi.com and enter the access code given to you at your visit today. This web site gives a general overview about your procedure. However, you should still follow specific instructions given to you by our office regarding your preparation for the procedure.   Today we are giving you samples to try of Linzess 290 mcg, take one daily on an empty stomach.  We are giving you a coupon as well. Let us know if this helps and we can send it in.   Stop your lactulose per Dr Leone PayorGessner.   Dr Leone PayorGessner recommends that you complete a bowel purge (to clean out your bowels). Please do the following: Purchase a bottle of Miralax over the counter as well as a box of 5 mg dulcolax tablets. Take 4 dulcolax tablets. Wait 1 hour. You will then drink 6-8 capfuls of Miralax mixed in an adequate amount of water/juice/gatorade (you may choose which of these liquids to drink) over the next 2-3 hours. You should expect results within 1 to 6 hours after completing the bowel purge.   I appreciate the opportunity to care for you. Stan Headarl Gessner, MD, [facility]

## 2017-08-16 ENCOUNTER — Encounter: Payer: Self-pay | Admitting: Family Medicine

## 2017-08-16 ENCOUNTER — Ambulatory Visit: Payer: No Typology Code available for payment source | Admitting: Family Medicine

## 2017-08-16 DIAGNOSIS — S99912D Unspecified injury of left ankle, subsequent encounter: Secondary | ICD-10-CM

## 2017-08-16 NOTE — Patient Instructions (Signed)
You have a Grade 3 ankle sprain. Ice the area for 15 minutes at a time, 3-4 times a day Tylenol during the day 500mg  1-2 tabs as needed. Elevate above the level of your heart when possible Crutches if needed to help with walking Bear weight when tolerated Use ankle brace when up and walking around to help with stability while you recover from this injury. Come out of the boot/brace twice a day to do Up/down and alphabet exercises 2-3 sets of each. Start physical therapy. If not improving as expected, we may repeat x-rays or consider further testing like an MRI. Follow up in 4 weeks.

## 2017-08-16 NOTE — Progress Notes (Signed)
PCP: Wanda Plump, MD Consultation requested by: Esperanza Richters PA-C  Subjective:   HPI: Patient is a 47 y.o. female here for left ankle injury.  3/21: Patient reports on 3/19 she accidentally hyper plantarflexed and inverted her left foot and ankle rushing to get out of her house. She states she can bear weight after this but has been limping ever since. She continues to have 5 out of 10 level sharp pain that shoots laterally into her foot. She states her third through fifth toes are numb. Denies prior foot or ankle injuries. She has been taking tramadol.  She did not take NSAIDs due to gastric bypass. No skin changes.  4/4: Patient reports she feels some improved compared to last visit. Has 3/10 level of pain, up to 7-8/10 and sharp. Has been wearing ASO. Pain radiating from dorsal foot to anterior ankle with bruising here. Rubber band sensation up foot. Has been icing, not taking anything for pain. Motion difficult. Pain worse with ambulation. No skin changes otherwise, no numbness.  Past Medical History:  Diagnosis Date  . Anemia   . Anxiety   . Bipolar affective disorder (HCC)   . Blood transfusion without reported diagnosis   . Bronchospasm with bronchitis, acute   . Depression   . Kidney stone   . SBO (small bowel obstruction) (HCC) 01/2014    Current Outpatient Medications on File Prior to Visit  Medication Sig Dispense Refill  . divalproex (DEPAKOTE ER) 500 MG 24 hr tablet Take 3 tablets (1,500 mg total) by mouth at bedtime. 90 tablet 2  . FLUoxetine (PROZAC) 20 MG tablet Take 4 tablets (80 mg total) by mouth daily. 120 tablet 2  . lamoTRIgine (LAMICTAL) 25 MG tablet Take 1 tablet (25 mg total) by mouth daily. 30 tablet 2  . linaclotide (LINZESS) 290 MCG CAPS capsule Take 290 mcg by mouth daily before breakfast.    . LORazepam (ATIVAN) 1 MG tablet Take 1 tablet (1 mg total) by mouth 3 (three) times daily as needed for anxiety. 90 tablet 2  . QUEtiapine (SEROQUEL)  100 MG tablet Take 1 tablet (100 mg total) by mouth 2 (two) times daily. 60 tablet 2  . QUEtiapine (SEROQUEL) 200 MG tablet Take 1 tablet (200 mg total) by mouth at bedtime. 30 tablet 2  . traZODone (DESYREL) 50 MG tablet 25-50 mg at night as needed for sleep 30 tablet 2  . valACYclovir (VALTREX) 1000 MG tablet Take 1,000 mg by mouth as needed.     . varenicline (CHANTIX CONTINUING MONTH PAK) 1 MG tablet Take 1 tablet (1 mg total) by mouth 2 (two) times daily. 60 tablet 2  . varenicline (CHANTIX STARTING MONTH PAK) 0.5 MG X 11 & 1 MG X 42 tablet Take one 0.5 mg tablet by mouth once daily for 3 days, then increase to one 0.5 mg tablet twice daily for 4 days, then increase to one 1 mg tablet twice daily. 53 tablet 0  . zolpidem (AMBIEN) 5 MG tablet Take 1 tablet (5 mg total) by mouth at bedtime. 30 tablet 2   No current facility-administered medications on file prior to visit.     Past Surgical History:  Procedure Laterality Date  . CESAREAN SECTION    . ENDOMETRIAL ABLATION    . GASTRIC BYPASS  2001  . SBO  2015  . TUBAL LIGATION      Allergies  Allergen Reactions  . Nsaids Other (See Comments)    Contraindicated with gastric bypass  Social History   Socioeconomic History  . Marital status: Married    Spouse name: Not on file  . Number of children: 2  . Years of education: Not on file  . Highest education level: Not on file  Occupational History  . Occupation: phelbotomist    Employer: [employer]  Social Needs  . Financial resource strain: Not on file  . Food insecurity:    Worry: Not on file    Inability: Not on file  . Transportation needs:    Medical: Not on file    Non-medical: Not on file  Tobacco Use  . Smoking status: Former Smoker    Packs/day: 0.50    Years: 6.00    Pack years: 3.00    Last attempt to quit: 05/11/2014    Years since quitting: 3.2  . Smokeless tobacco: Never Used  . Tobacco comment: 1/2 ppd   Substance and Sexual Activity  . Alcohol  use: No    Alcohol/week: 0.0 oz  . Drug use: No  . Sexual activity: Yes    Partners: Male    Birth control/protection: Surgical  Lifestyle  . Physical activity:    Days per week: Not on file    Minutes per session: Not on file  . Stress: Not on file  Relationships  . Social connections:    Talks on phone: Not on file    Gets together: Not on file    Attends religious service: Not on file    Active member of club or organization: Not on file    Attends meetings of clubs or organizations: Not on file    Relationship status: Not on file  . Intimate partner violence:    Fear of current or ex partner: Not on file    Emotionally abused: Not on file    Physically abused: Not on file    Forced sexual activity: Not on file  Other Topics Concern  . 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 Diabetes Mother   . Cancer Mother 60       Throat   . Bipolar disorder Mother   . Bipolar disorder Sister   . Bipolar disorder Brother   . Drug abuse Brother   . Bipolar disorder Sister   . Colon cancer Neg Hx   . Cancer - Prostate Neg Hx   . Breast cancer Neg Hx     BP 112/78   Pulse 80   Ht 5\' 4"  (1.626 m)   Wt 220 lb (99.8 kg)   BMI 37.76 kg/m   Review of Systems: See HPI above.     Objective:  Physical Exam:  Gen: NAD, comfortable in exam room.  Left foot/ankle: Mild-mod swelling lateral ankle into foot with bruising distal dorsal foot.  No other deformity. Mod limitation ROM all directions.  5/5 strength each direction but painful. TTP greatest at ATFL, less dorsal foot and lateral malleolus.  No other tenderness. 2+ ant drawer, talar  tilt. Negative syndesmotic compression. Thompsons test negative. NV intact distally.   MSK u/s left foot/ankle:  No cortical irregularity of metatarsals or edema overlying cortices.  Ankle effusion noted again.  Assessment & Plan:  1.  Left ankle injury: Radiographs negative and ultrasound reassuring.  Consistent with grade 3 lateral ankle sprain.  Icing, tylenol, elevation.  ASO.  Start physical therapy and do home exercises on days she doesn't go to therapy.  F/u in 4 weeks.

## 2017-08-16 NOTE — Assessment & Plan Note (Signed)
Radiographs negative and ultrasound reassuring.  Consistent with grade 3 lateral ankle sprain.  Icing, tylenol, elevation.  ASO.  Start physical therapy and do home exercises on days she doesn't go to therapy.  F/u in 4 weeks.

## 2017-08-17 ENCOUNTER — Telehealth: Payer: Self-pay | Admitting: Family Medicine

## 2017-08-17 NOTE — Telephone Encounter (Signed)
Patient stated her employer will not accept the letter. She said the letter needs to state either restrictions or that she needs to be out completely, but not both.

## 2017-08-17 NOTE — Telephone Encounter (Signed)
That is documented in the letter she was provided.  She must remain out of work for 4 weeks if desk job isn't available.

## 2017-08-17 NOTE — Telephone Encounter (Signed)
Patient calling regarding her work note. States employer is requesting the note to state she can either work or needs to be out of work. They do not have a desk job available for her and informed her she either return to full job duties or stays out.  Patient is requesting a note stating she needs to be out of work until her follow up.

## 2017-08-17 NOTE — Telephone Encounter (Signed)
I can change the phrasing to say because no desk job is available she must remain out of work.  I've printed this for her.  I cannot change it to say she cannot work because legally if there is something they can have her do where she is seated that doesn't require walking or standing, she can do that kind of work.

## 2017-08-17 NOTE — Telephone Encounter (Signed)
Patient was informed. Letter will be faxed as requested

## 2017-08-21 MED FILL — QUETIAPINE FUMARATE 200 MG: 200 | 30 days supply | Qty: 30 | Fill #0

## 2017-08-28 ENCOUNTER — Encounter: Payer: Self-pay | Admitting: Family Medicine

## 2017-08-28 ENCOUNTER — Encounter: Payer: Self-pay | Admitting: Internal Medicine

## 2017-08-29 MED ORDER — LINACLOTIDE 290 MCG PO CAPS: 290.0000 ug | ORAL_CAPSULE | Freq: Every day | ORAL | 3 refills | Status: DC

## 2017-08-29 MED FILL — LINZESS 290 MCG CAPSULE: 290 | 90 days supply | Qty: 90 | Fill #0

## 2017-08-29 MED FILL — ZOLPIDEM TARTRATE 5 MG TABL: 5 | 30 days supply | Qty: 30 | Fill #1

## 2017-08-29 NOTE — Addendum Note (Signed)
Addended by: Kathi SimpersWISE, Vanessa Alesi F on: 08/29/2017 08:11 AM   Modules accepted: Orders

## 2017-08-29 NOTE — Telephone Encounter (Signed)
linzess worked for patient.  I sent an RX.

## 2017-09-04 ENCOUNTER — Ambulatory Visit: Payer: No Typology Code available for payment source | Attending: Family Medicine | Admitting: Physical Therapy

## 2017-09-04 ENCOUNTER — Encounter: Payer: Self-pay | Admitting: Physical Therapy

## 2017-09-04 ENCOUNTER — Other Ambulatory Visit: Payer: Self-pay

## 2017-09-04 DIAGNOSIS — M25672 Stiffness of left ankle, not elsewhere classified: Secondary | ICD-10-CM | POA: Diagnosis present

## 2017-09-04 DIAGNOSIS — M6281 Muscle weakness (generalized): Secondary | ICD-10-CM | POA: Diagnosis present

## 2017-09-04 DIAGNOSIS — R262 Difficulty in walking, not elsewhere classified: Secondary | ICD-10-CM | POA: Diagnosis present

## 2017-09-04 DIAGNOSIS — M25572 Pain in left ankle and joints of left foot: Secondary | ICD-10-CM | POA: Diagnosis present

## 2017-09-04 NOTE — Addendum Note (Signed)
Addended by: Marry GuanKREIS, JOANNE M on: 09/04/2017 06:58 PM   Modules accepted: Orders

## 2017-09-04 NOTE — Therapy (Addendum)
[facility] Outpatient Rehabilitation [facility] [address]  [address] [place], [place], [address] Phone: [phone]   Fax:  [phone]  Physical Therapy Evaluation  Patient Details  Name: Ashley Franklin MRN: [id number] Date of Birth: May 19, 1970 Referring Provider: Norton Blizzard, MD   Encounter Date: 09/04/2017  PT End of Session - 09/04/17 1528    Visit Number  1    Number of Visits  12    Date for PT Re-Evaluation  10/19/17    Authorization Type  [facility] [employer]    PT Start Time  1527    PT Stop Time  1614    PT Time Calculation (min)  47 min    Activity Tolerance  Patient tolerated treatment well    Behavior During Therapy  [facility] for tasks assessed/performed       Past Medical History:  Diagnosis Date  . Anemia   . Anxiety   . Bipolar affective disorder (HCC)   . Blood transfusion without reported diagnosis   . Bronchospasm with bronchitis, acute   . Depression   . Kidney stone   . SBO (small bowel obstruction) (HCC) 01/2014    Past Surgical History:  Procedure Laterality Date  . CESAREAN SECTION    . ENDOMETRIAL ABLATION    . GASTRIC BYPASS  2001  . SBO  2015  . TUBAL LIGATION      There were no vitals filed for this visit.   Subjective Assessment - 09/04/17 1530    Subjective  Pt. stated that she had a fall on March 19th, woke up the next morning unable to put pressure on L foot. Stated that she thought it was broke, received x-rays however no fracture. Since then she has been experiencing pain in L lateral ankle, and also between the 4th/5th toe. Pt. described feeling as an electric current. Pt. has still been completing a mile on elliptical for exercise. Pt. is currently unable to work due to pain with standing/walking.  Has been wearing ankle brace during waking hours and can tell a difference when she is wearing it.     Limitations  Standing;Walking    How long can you stand comfortably?  20 minutes    How long can you walk  comfortably?  45 minutes (pain stays constant w/ walking)     Diagnostic tests  3/21 Effusion noted along with soft tissue swelling.  No cortical irregularities of distal fibula, fifth through third metatarsals.  No edema overlying the cortices of these bones either.  Peroneal tendons are intact without tenosynovitis.  Talar dome also intact without avulsion.    Patient Stated Goals  Reduce Pain; Increase Walking Threshold;    Currently in Pain?  Yes    Pain Score  2  worse 5/10    Pain Location  Ankle    Pain Orientation  Left;Lateral    Pain Descriptors / Indicators  -- "electric current" through the foot    Pain Type  Acute pain    Pain Radiating Towards  radiates up the posterior L LE (calf region)     Pain Onset  More than a month ago    Pain Frequency  Constant    Aggravating Factors   Squatting    Pain Relieving Factors  Elevating Foot; Ice; Wearing Brace    Effect of Pain on Daily Activities  Unable to Work; Limited w/ Standing Activities; Unable to walk long distances         [facility] PT  Assessment - 09/04/17 1539      Assessment   Medical Diagnosis  L Ankle Injury     Referring Provider  Norton Blizzard, MD    Onset Date/Surgical Date  07/31/17    Next MD Visit  09/14/2017    Prior Therapy  No      Balance Screen   Has the patient fallen in the past 6 months  Yes    How many times?  1 (at time of ankle injury)     Has the patient had a decrease in activity level because of a fear of falling?   No    Is the patient reluctant to leave their home because of a fear of falling?   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     Leisure  [place]; Working [employer]      Observation/Other Assessments   Focus on Therapeutic Outcomes (FOTO)   40% (60% Limitation); Predicted 61% (Predicted 39% Limitation)      ROM / Strength   AROM / PROM / Strength  AROM;Strength      AROM   AROM Assessment Site  Ankle    Right/Left Ankle  Right;Left    Right Ankle Dorsiflexion  7    Right Ankle Plantar Flexion  59    Right Ankle Inversion  18    Right Ankle Eversion  20    Left Ankle Dorsiflexion  -4    Left Ankle Plantar Flexion  44    Left Ankle Inversion  12    Left Ankle Eversion  14      Strength   Strength Assessment Site  Ankle;Knee    Right/Left Knee  Right;Left    Right Knee Flexion  4+/5    Right Knee Extension  4+/5    Left Knee Flexion  4-/5    Left Knee Extension  4/5    Right/Left Ankle  Right;Left    Right Ankle Dorsiflexion  4+/5    Right Ankle Plantar Flexion  3+/5    Right Ankle Inversion  4+/5    Right Ankle Eversion  4+/5    Left Ankle Dorsiflexion  4/5    Left Ankle Plantar Flexion  3-/5    Left Ankle Inversion  4/5    Left Ankle Eversion  4/5      Flexibility   Soft Tissue Assessment /Muscle Length  --      Palpation   Palpation comment  TTP over L Lateral Malleolus; TTP at 4th/5th Toes; Mod Tightness in L Gastroc/Soleus                Objective measurements completed on examination: See above findings.      [facility] Adult PT Treatment/Exercise - 09/04/17 0001      Exercises   Exercises  Ankle      Ankle Exercises: Stretches   Soleus Stretch  1 rep;30 seconds    Soleus Stretch Limitations  seated w/ towel    Gastroc Stretch  1 rep;30 seconds;Limitations    Gastroc Stretch Limitations  seated w/ towel; attempted standing however pt. unable to tolerate due to pain      Ankle Exercises: Seated   Ankle Circles/Pumps  AROM;Left;15 reps    Ankle Circles/Pumps Limitations  unable to complete full circle; advised to complete as much as possible and to  continously challenge herself w/ each repetition             PT Education - 09/04/17 1735    Education provided  Yes    Education Details  Pt. educated on evaluation findings, plan of care, and initial HEP    Person(s) Educated  Patient    Methods  Explanation;Demonstration;Handout    Comprehension  Verbalized understanding;Returned demonstration       PT Short Term Goals - 09/04/17 1740      PT SHORT TERM GOAL #1   Title  Independent w/ inital HEP     Status  New    Target Date  09/21/17        PT Long Term Goals - 09/04/17 1740      PT LONG TERM GOAL #1   Title  Independent w/ ongoing HEP    Status  New    Target Date  10/19/17      PT LONG TERM GOAL #2   Title  Pt. will demonstrate an increase in L ankle ROM to [facility] to allow for functional mobility     Status  New    Target Date  10/19/17      PT LONG TERM GOAL #3   Title  Pt. will demonstrate increase in L ankle strength to >/= 4/5 to allow for increased ankle stability    Status  New    Target Date  10/19/17      PT LONG TERM GOAL #4   Title  Pt. will report an increase in walking tolerance to >/= 30 minutes w/o pain     Status  New    Target Date  10/19/17             Plan - 09/04/17 1742    Clinical Impression Statement  Spring is a 47 y.o. female that presents to PT following a L Grade 3 ankle sprain that occured on 07/31/2017. Pt. reported a fall occured in which caused injury to the L ankle. Has since been experiencing relatively constant pain in L lateral ankle and 4th/5th toes. 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 Pt. presents to PT with slight edema/warmth, and was TTP at L lateral malleolus. Pt. has a significant decrease in L ankle ROM, especially with DF. Pt. was unable to currently complete a heel raise, and moderate weakness noted overall in L ankle musculature. Pt. also had moderate tightness in L gastroc. Pt. reported unable to complete Ankle ABC's  prescribed by referring MD, therefore educated on ankle circles to faciliate an increase in ROM.  Also educated on gastroc/soleus stretch w/ towel to allow for decrease in tightness in those muscles and faciliate an overpressure into DF.  Pt. will benefit from skilled physical therapy to address impairments, and increase standing/walking tolerance to allow for pt. to return to work activities.    History and Personal Factors relevant to plan of care:  Plantar Fascitis, Obesity, and Chronic Knee Pain    Clinical Presentation  Evolving    Clinical Presentation due to:  Pt. is a young female that presents w/ an acute L ankle  injury however, is approx. 1 month out from initial injury and has seen little to no improvement with more recent onset of nerve related pain. Pt. also has multiple comorbities that may impact POC.     Clinical Decision Making  Moderate    Rehab Potential  Good    PT Frequency  2x / week    PT Duration  6 weeks    PT Treatment/Interventions  ADLs/Self Care Home Management;Cryotherapy;Moist Heat;Iontophoresis 4mg /ml Dexamethasone;Gait training;Therapeutic exercise;Ultrasound;Electrical Stimulation;Patient/family education;Neuromuscular re-education;Manual techniques;Dry needling;Passive range of motion;Vasopneumatic Device;Taping;Balance training;Therapeutic activities;Stair training    Consulted and Agree with Plan of Care  Patient       Patient will benefit from skilled therapeutic intervention in order to improve the following deficits and impairments:  Abnormal gait, Pain, Decreased activity tolerance, Decreased endurance, Decreased range of motion, Decreased strength, Increased edema, Impaired flexibility, Increased muscle spasms, Difficulty walking, Decreased balance, Impaired perceived functional ability  Visit Diagnosis: Pain in left ankle and joints of left foot  Stiffness of left ankle, not elsewhere classified  Difficulty in walking, not elsewhere classified  Muscle  weakness (generalized)     Problem List Patient Active Problem List   Diagnosis Date Noted  . Left ankle injury, subsequent encounter 08/04/2017  . PCP NOTES >>>>>>>>>> 07/11/2017  . 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 Chronic knee pain 04/18/2015  . Plantar fasciitis 04/18/2015  . Tobacco use disorder 03/15/2015  . Morbid obesity (HCC) 01/05/2015  . Bipolar affective disorder [facility]) 01/02/2015    Jethro Bastos, SPT 09/04/2017, 6:55 PM  [facility] [address]  [address] [place], [place], [address] Phone: [phone]   Fax:  [phone]  Name: PALLIE SWIGERT MRN: [id number] Date of Birth: Jan 30, 1971

## 2017-09-07 ENCOUNTER — Ambulatory Visit: Payer: No Typology Code available for payment source

## 2017-09-11 ENCOUNTER — Ambulatory Visit: Payer: No Typology Code available for payment source

## 2017-09-11 DIAGNOSIS — M6281 Muscle weakness (generalized): Secondary | ICD-10-CM

## 2017-09-11 DIAGNOSIS — M25572 Pain in left ankle and joints of left foot: Secondary | ICD-10-CM

## 2017-09-11 DIAGNOSIS — M25672 Stiffness of left ankle, not elsewhere classified: Secondary | ICD-10-CM

## 2017-09-11 DIAGNOSIS — R262 Difficulty in walking, not elsewhere classified: Secondary | ICD-10-CM

## 2017-09-11 MED FILL — CHANTIX 1 MG TABLET: 1 | 28 days supply | Qty: 56 | Fill #1

## 2017-09-11 MED FILL — LORazepam 1 MG TABS: 1 | 30 days supply | Qty: 90 | Fill #1

## 2017-09-11 MED FILL — DIVALPROEX SOD ER 500 MG TA: 500 | 30 days supply | Qty: 90 | Fill #1

## 2017-09-11 NOTE — Therapy (Signed)
[facility] Outpatient Rehabilitation [facility] [address]  [address] [place], [place], [address] Phone: [phone]   Fax:  [phone]  Physical Therapy Treatment  Patient Details  Name: Ashley Franklin MRN: [id number] Date of Birth: June 20, 1970 Referring Provider: Norton Blizzard, MD   Encounter Date: 09/11/2017  PT End of Session - 09/11/17 1126    Visit Number  2    Number of Visits  12    Date for PT Re-Evaluation  10/19/17    Authorization Type  [facility] [employer]    PT Start Time  1103    PT Stop Time  1202    PT Time Calculation (min)  59 min    Activity Tolerance  Patient tolerated treatment well    Behavior During Therapy  [facility] for tasks assessed/performed       Past Medical History:  Diagnosis Date  . Anemia   . Anxiety   . Bipolar affective disorder (HCC)   . Blood transfusion without reported diagnosis   . Bronchospasm with bronchitis, acute   . Depression   . Kidney stone   . SBO (small bowel obstruction) (HCC) 01/2014    Past Surgical History:  Procedure Laterality Date  . CESAREAN SECTION    . ENDOMETRIAL ABLATION    . GASTRIC BYPASS  2001  . SBO  2015  . TUBAL LIGATION      There were no vitals filed for this visit.  Subjective Assessment - 09/11/17 1112    Subjective  Pt. doing well today.  Did have some increased ankle soreness after walking and standing at food truck rally.      Diagnostic tests  3/21 Effusion noted along with soft tissue swelling.  No cortical irregularities of distal fibula, fifth through third metatarsals.  No edema overlying the cortices of these bones either.  Peroneal tendons are intact without tenosynovitis.  Talar dome also intact without avulsion.    Patient Stated Goals  Reduce Pain; Increase Walking Threshold;    Currently in Pain?  Yes    Pain Score  2     Pain Location  Ankle    Pain Orientation  Left;Lateral    Pain Type  Acute pain    Pain Radiating Towards  radiates up the posterior lateral L  LE    Pain Onset  More than a month ago    Pain Frequency  Constant    Aggravating Factors   Squatting     Multiple Pain Sites  No                       [facility] Adult PT Treatment/Exercise - 09/11/17 1134      Modalities   Modalities  Vasopneumatic      Vasopneumatic   Number Minutes Vasopneumatic   10 minutes    Vasopnuematic Location   Ankle L    Vasopneumatic Pressure  Medium    Vasopneumatic Temperature   coldest temp.      Manual Therapy   Manual Therapy  Soft tissue mobilization;Passive ROM    Manual therapy comments  Supine     Soft tissue mobilization  STM to L ankle complex; ttp anterior lateral L ankle    Passive ROM  Manual L Gastroc stretch with therapist       Ankle Exercises: Seated   Towel Crunch  3 reps    Heel Raises  15 reps;Both    Toe Raise  15 reps;3 seconds  BAPS  Level 2;Sitting;10 reps    BAPS Limitations  R/L, front/back, CW, CCW    Other Seated Ankle Exercises  4-way ankle with yellow TB x 15 reps  Cues to avoid painful range in EV      Ankle Exercises: Aerobic   Nustep  Lvl 3, 6 min              PT Education - 09/11/17 1236    Education provided  Yes    Education Details  HEP update     Person(s) Educated  Patient    Methods  Explanation;Demonstration;Verbal cues;Handout    Comprehension  Verbalized understanding;Returned demonstration;Verbal cues required;Need further instruction       PT Short Term Goals - 09/11/17 1126      PT SHORT TERM GOAL #1   Title  Independent w/ inital HEP     Status  On-going        PT Long Term Goals - 09/11/17 1126      PT LONG TERM GOAL #1   Title  Independent w/ ongoing HEP    Status  On-going      PT LONG TERM GOAL #2   Title  Pt. will demonstrate an increase in L ankle ROM to [facility] to allow for functional mobility     Status  On-going      PT LONG TERM GOAL #3   Title  Pt. will demonstrate increase in L ankle strength to >/= 4/5 to allow for increased ankle stability     Status  On-going      PT LONG TERM GOAL #4   Title  Pt. will report an increase in walking tolerance to >/= 30 minutes w/o pain     Status  On-going            Plan - 09/11/17 1126    Clinical Impression Statement  Spring reporting some increased L ankle soreness following walking and standing for ~ five hours at food truck rally on Saturday.  No issues with HEP and did not feel need to review.  Tolerated all ROM and gentle strengthening activities in treatment well today.  Some cueing required today to avoid painful range with yellow TB resisted 4-way ankle strengthening.  Ended with ice/compression to L ankle to reduce post-exercise swelling and pain.  HEP updated and will monitor tolerance in coming visits.      PT Treatment/Interventions  ADLs/Self Care Home Management;Cryotherapy;Moist Heat;Iontophoresis /ml Dexamethasone;Gait training;Therapeutic exercise;Ultrasound;Electrical Stimulation;Patient/family education;Neuromuscular re-education;Manual techniques;Dry needling;Passive range of motion;Vasopneumatic Device;Taping;Balance training;Therapeutic activities;Stair training    Consulted and Agree with Plan of Care  Patient       Patient will benefit from skilled therapeutic intervention in order to improve the following deficits and impairments:  Abnormal gait, Pain, Decreased activity tolerance, Decreased endurance, Decreased range of motion, Decreased strength, Increased edema, Impaired flexibility, Increased muscle spasms, Difficulty walking, Decreased balance, Impaired perceived functional ability  Visit Diagnosis: Pain in left ankle and joints of left foot  Stiffness of left ankle, not elsewhere classified  Difficulty in walking, not elsewhere classified  Muscle weakness (generalized)     Problem List Patient Active Problem List   Diagnosis Date Noted  . Left ankle injury, subsequent encounter 08/04/2017  . PCP NOTES >>>>>>>>>> 07/11/2017  . Apneic episode  06/25/2015  . Chronic knee pain 04/18/2015  . Plantar fasciitis 04/18/2015  . Tobacco use disorder 03/15/2015  . Morbid obesity (HCC) 01/05/2015  . Bipolar affective disorder (HCC) 01/02/2015    Ashley Franklin  Ashley Franklin, [place] 09/11/17 12:43 PM  [facility] Outpatient Rehabilitation [facility] [address]  [address] [place], [place], [address] Phone: [phone]   Fax:  [phone]  Name: Ashley Franklin MRN: [id number] Date of Birth: Jun 25, 1970

## 2017-09-14 ENCOUNTER — Ambulatory Visit: Payer: No Typology Code available for payment source | Attending: Family Medicine

## 2017-09-14 ENCOUNTER — Ambulatory Visit: Payer: No Typology Code available for payment source | Admitting: Family Medicine

## 2017-09-14 ENCOUNTER — Encounter: Payer: Self-pay | Admitting: Family Medicine

## 2017-09-14 VITALS — BP 108/76 | HR 78 | Ht 64.0 in | Wt 220.0 lb

## 2017-09-14 DIAGNOSIS — M25572 Pain in left ankle and joints of left foot: Secondary | ICD-10-CM | POA: Insufficient documentation

## 2017-09-14 DIAGNOSIS — M6281 Muscle weakness (generalized): Secondary | ICD-10-CM | POA: Insufficient documentation

## 2017-09-14 DIAGNOSIS — M25672 Stiffness of left ankle, not elsewhere classified: Secondary | ICD-10-CM | POA: Diagnosis present

## 2017-09-14 DIAGNOSIS — R262 Difficulty in walking, not elsewhere classified: Secondary | ICD-10-CM | POA: Diagnosis present

## 2017-09-14 DIAGNOSIS — S99912D Unspecified injury of left ankle, subsequent encounter: Secondary | ICD-10-CM

## 2017-09-14 NOTE — Therapy (Signed)
[facility] Outpatient Rehabilitation [facility] [address]  [address] [place], [place], [address] Phone: [phone]   Fax:  [phone]  Physical Therapy Treatment  Patient Details  Name: Ashley Franklin MRN: [id number] Date of Birth: [birth year] Referring Provider: Norton Blizzard, MD   Encounter Date: 09/14/2017  PT End of Session - 09/14/17 0802    Visit Number  3    Number of Visits  12    Date for PT Re-Evaluation  10/19/17    Authorization Type  [facility] [employer]    PT Start Time  [id number]    PT Stop Time  0855    PT Time Calculation (min)  57 min    Activity Tolerance  Patient tolerated treatment well    Behavior During Therapy  [facility] for tasks assessed/performed       Past Medical History:  Diagnosis Date  . Anemia   . Anxiety   . Bipolar affective disorder (HCC)   . Blood transfusion without reported diagnosis   . Bronchospasm with bronchitis, acute   . Depression   . Kidney stone   . SBO (small bowel obstruction) (HCC) 01/2014    Past Surgical History:  Procedure Laterality Date  . CESAREAN SECTION    . ENDOMETRIAL ABLATION    . GASTRIC BYPASS  2001  . SBO  2015  . TUBAL LIGATION      There were no vitals filed for this visit.  Subjective Assessment - 09/14/17 0805    Subjective  Pt. noting she felt fine after last visit.      Diagnostic tests  3/21 Effusion noted along with soft tissue swelling.  No cortical irregularities of distal fibula, fifth through third metatarsals.  No edema overlying the cortices of these bones either.  Peroneal tendons are intact without tenosynovitis.  Talar dome also intact without avulsion.    Patient Stated Goals  Reduce Pain; Increase Walking Threshold;    Currently in Pain?  Yes    Pain Score  -- 4/10 while walking     Pain Location  Ankle    Pain Orientation  Left;Lateral    Pain Type  Acute pain    Pain Radiating Towards  Radiates     Pain Onset  More than a month ago    Pain Frequency  Constant    Multiple Pain Sites  No                       [facility] Adult PT Treatment/Exercise - 09/14/17 0272      Neuro Re-ed    Neuro Re-ed Details   Alternating forward, lateral step up to foam x 10 each way; no UE support; no increased ankle pain; Alternating high knee march on foam x 10 reps; eyes closed standard stance stand on foam 2 x 30 sec (second set with therapist perturbations)      Vasopneumatic   Number Minutes Vasopneumatic   15 minutes    Vasopnuematic Location   Ankle L    Vasopneumatic Pressure  Medium    Vasopneumatic Temperature   coldest temp.      Manual Therapy   Manual Therapy  Soft tissue mobilization;Myofascial release    Manual therapy comments  Supine     Soft tissue mobilization  STM to L peroneals     Myofascial Release  TPR to L peroneals group in area of tenderness mid lateral LE; very ttp      Ankle Exercises: Standing  Other Standing Ankle Exercises  Alternating LE march standing on airex pad x 15 reps; intermittent UE support on counter       Ankle Exercises: Seated   Heel Raises  20 reps;Both    Toe Raise  20 reps;3 seconds    BAPS  Level 2;Sitting;10 reps    BAPS Limitations  R/L, front/back, CW, CCW; 2.5# attempted at PL pole however removed due to L lateral ankle pain  peg at PL pole       Ankle Exercises: Aerobic   Stationary Bike  Lvl 1, 6 min - for ROM                PT Short Term Goals - 09/14/17 1610      PT SHORT TERM GOAL #1   Title  Independent w/ inital HEP     Status  Achieved        PT Long Term Goals - 09/11/17 1126      PT LONG TERM GOAL #1   Title  Independent w/ ongoing HEP    Status  On-going      PT LONG TERM GOAL #2   Title  Pt. will demonstrate an increase in L ankle ROM to [facility] to allow for functional mobility     Status  On-going      PT LONG TERM GOAL #3   Title  Pt. will demonstrate increase in L ankle strength to >/= 4/5 to allow for increased ankle stability    Status  On-going      PT  LONG TERM GOAL #4   Title  Pt. will report an increase in walking tolerance to >/= 30 minutes w/o pain     Status  On-going            Plan - 09/14/17 0759    Clinical Impression Statement  Spring reporting no significant pain following last visit.  Has been noting some "popping" and pain on L medial ankle while performing HEP last few days, which she states is more frequent than previously.  Tolerated mild progression of seated BAPS board and initiation of standing stability activities on foam well today.  No significant rise in ankle pain with therex today.  Ended with ice/compression to L ankle to decrease post-exercise swelling and pain.  Will continue to progress toward goals.      PT Treatment/Interventions  ADLs/Self Care Home Management;Cryotherapy;Moist Heat;Iontophoresis /ml Dexamethasone;Gait training;Therapeutic exercise;Ultrasound;Electrical Stimulation;Patient/family education;Neuromuscular re-education;Manual techniques;Dry needling;Passive range of motion;Vasopneumatic Device;Taping;Balance training;Therapeutic activities;Stair training    Consulted and Agree with Plan of Care  Patient       Patient will benefit from skilled therapeutic intervention in order to improve the following deficits and impairments:  Abnormal gait, Pain, Decreased activity tolerance, Decreased endurance, Decreased range of motion, Decreased strength, Increased edema, Impaired flexibility, Increased muscle spasms, Difficulty walking, Decreased balance, Impaired perceived functional ability  Visit Diagnosis: Pain in left ankle and joints of left foot  Stiffness of left ankle, not elsewhere classified  Difficulty in walking, not elsewhere classified  Muscle weakness (generalized)     Problem List Patient Active Problem List   Diagnosis Date Noted  . Left ankle injury, subsequent encounter 08/04/2017  . PCP NOTES >>>>>>>>>> 07/11/2017  . 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 Chronic knee pain  04/18/2015  . Plantar fasciitis 04/18/2015  . Tobacco use disorder 03/15/2015  . Morbid obesity (HCC) 01/05/2015  . Bipolar affective disorder (HCC) 01/02/2015    Kermit Balo, PTA 09/14/17 10:10 AM  [facility] [address]  [address] [place], [place], [address] Phone: [phone]   Fax:  [phone]  Name: Ashley Franklin MRN: [id number] Date of Birth: Jun 06, 1970

## 2017-09-14 NOTE — Patient Instructions (Signed)
We will go ahead with an MRI of your ankle to assess for an osteochondral defect. Continue with the brace and current treatment in the meantime. Follow up with me 1-2 days after the MRI for a no charge visit to go over the results and next steps.

## 2017-09-16 ENCOUNTER — Encounter: Payer: Self-pay | Admitting: Family Medicine

## 2017-09-16 NOTE — Progress Notes (Signed)
PCP: Wanda Plump, MD Consultation requested by: Esperanza Richters PA-C  Subjective:   HPI: Patient is a 47 y.o. female here for left ankle injury.  3/21: Patient reports on 3/19 she accidentally hyper plantarflexed and inverted her left foot and ankle rushing to get out of her house. She states she can bear weight after this but has been limping ever since. She continues to have 5 out of 10 level sharp pain that shoots laterally into her foot. She states her third through fifth toes are numb. Denies prior foot or ankle injuries. She has been taking tramadol.  She did not take NSAIDs due to gastric bypass. No skin changes.  4/4: Patient reports she feels some improved compared to last visit. Has 3/10 level of pain, up to 7-8/10 and sharp. Has been wearing ASO. Pain radiating from dorsal foot to anterior ankle with bruising here. Rubber band sensation up foot. Has been icing, not taking anything for pain. Motion difficult. Pain worse with ambulation. No skin changes otherwise, no numbness.  5/3: Patient returns with only mild improvement compared to last visit. Using ASO. Pain level 4/10 anterior ankle, sharp, worse with bending foot up and walking. + swelling. Started physical therapy but only done 3 visits so far.   No skin changes, numbness.  Past Medical History:  Diagnosis Date  . Anemia   . Anxiety   . Bipolar affective disorder (HCC)   . Blood transfusion without reported diagnosis   . Bronchospasm with bronchitis, acute   . Depression   . Kidney stone   . SBO (small bowel obstruction) (HCC) 01/2014    Current Outpatient Medications on File Prior to Visit  Medication Sig Dispense Refill  . divalproex (DEPAKOTE ER) 500 MG 24 hr tablet Take 3 tablets (1,500 mg total) by mouth at bedtime. 90 tablet 2  . FLUoxetine (PROZAC) 20 MG tablet Take 4 tablets (80 mg total) by mouth daily. 120 tablet 2  . lamoTRIgine (LAMICTAL) 25 MG tablet Take 1 tablet (25 mg total) by mouth  daily. 30 tablet 2  . linaclotide (LINZESS) 290 MCG CAPS capsule Take 1 capsule (290 mcg total) by mouth daily before breakfast. 30 capsule 3  . LORazepam (ATIVAN) 1 MG tablet Take 1 tablet (1 mg total) by mouth 3 (three) times daily as needed for anxiety. 90 tablet 2  . QUEtiapine (SEROQUEL) 100 MG tablet Take 1 tablet (100 mg total) by mouth 2 (two) times daily. 60 tablet 2  . QUEtiapine (SEROQUEL) 200 MG tablet Take 1 tablet (200 mg total) by mouth at bedtime. 30 tablet 2  . traZODone (DESYREL) 50 MG tablet 25-50 mg at night as needed for sleep 30 tablet 2  . valACYclovir (VALTREX) 1000 MG tablet Take 1,000 mg by mouth as needed.     . varenicline (CHANTIX CONTINUING MONTH PAK) 1 MG tablet Take 1 tablet (1 mg total) by mouth 2 (two) times daily. 60 tablet 2  . varenicline (CHANTIX STARTING MONTH PAK) 0.5 MG X 11 & 1 MG X 42 tablet Take one 0.5 mg tablet by mouth once daily for 3 days, then increase to one 0.5 mg tablet twice daily for 4 days, then increase to one 1 mg tablet twice daily. (Patient not taking: Reported on 09/04/2017) 53 tablet 0  . zolpidem (AMBIEN) 5 MG tablet Take 1 tablet (5 mg total) by mouth at bedtime. 30 tablet 2   No current facility-administered medications on file prior to visit.     Past Surgical  History:  Procedure Laterality Date  . CESAREAN SECTION    . ENDOMETRIAL ABLATION    . GASTRIC BYPASS  2001  . SBO  2015  . TUBAL LIGATION      Allergies  Allergen Reactions  . Nsaids Other (See Comments)    Contraindicated with gastric bypass    Social History   Socioeconomic History  . Marital status: Married    Spouse name: Not on file  . Number of children: 2  . Years of education: Not on file  . Highest education level: Not on file  Occupational History  . Occupation: phelbotomist    Employer: [employer]  Social Needs  . Financial resource strain: Not on file  . Food insecurity:    Worry: Not on file    Inability: Not on file  . Transportation  needs:    Medical: Not on file    Non-medical: Not on file  Tobacco Use  . Smoking status: Former Smoker    Packs/day: 0.50    Years: 6.00    Pack years: 3.00    Last attempt to quit: 05/11/2014    Years since quitting: 3.3  . Smokeless tobacco: Never Used  . Tobacco comment: 1/2 ppd   Substance and Sexual Activity  . Alcohol use: No    Alcohol/week: 0.0 oz  . Drug use: No  . Sexual activity: Yes    Partners: Male    Birth control/protection: Surgical  Lifestyle  . Physical activity:    Days per week: Not on file    Minutes per session: Not on file  . Stress: Not on file  Relationships  . Social connections:    Talks on phone: Not on file    Gets together: Not on file    Attends religious service: Not on file    Active member of club or organization: Not on file    Attends meetings of clubs or organizations: Not on file    Relationship status: Not on file  . Intimate partner violence:    Fear of current or ex partner: Not on file    Emotionally abused: Not on file    Physically abused: Not on file    Forced sexual activity: Not on file  Other Topics Concern  . Not on file  Social History Narrative   The patient is a Water quality scientist at [employer]    1 son born 1988-08-13 daughter born in 18 she is married and lives with her husband   Prior smoker not current 3 caffeinated beverages a day no alcohol tobacco or drug use      2 independent teachers    Pharmacy schools, music teacher    Family History  Problem Relation Age of Onset  . Diabetes Mother   . Cancer Mother 60       Throat   . Bipolar disorder Mother   . Bipolar disorder Sister   . Bipolar disorder Brother   . Drug abuse Brother   . Bipolar disorder Sister   . Colon cancer Neg Hx   . Cancer - Prostate Neg Hx   . Breast cancer Neg Hx     BP 108/76   Pulse 78   Ht  (1.626 m)   Wt 220 lb (99.8 kg)   BMI 37.76 kg/m   Review of Systems: See HPI above.     Objective:  Physical  Exam:  Gen: NAD, comfortable in exam room  Left foot/ankle: Mild swelling anterior and lateral  ankle.  no other deformity, swelling, ecchymoses Mild limitation ROM all directions with 5/5 strength, pain with dorsiflexion anterior ankle. TTP anterior ankle joint, over ATFL.  No other tenderness. 2+ ant drawer and talar tilt.   Negative syndesmotic compression. Thompsons test negative. NV intact distally.  Assessment & Plan:  1.  Left ankle injury:  Radiographs negative.  Over 6 weeks out now from injury and still struggling.  She still has instability which is likely the cause of her continued pain with the severe lateral sprain.  We discussed additional physical therapy over 3-4 weeks then reevaluating, considering MRI vs doing MRI now to assess for OCD.  Opted for the latter.  Continue with ASO, icing, tylenol, elevation, physical therapy in the meantime.

## 2017-09-16 NOTE — Assessment & Plan Note (Signed)
Radiographs negative.  Over 6 weeks out now from injury and still struggling.  She still has instability which is likely the cause of her continued pain with the severe lateral sprain.  We discussed additional physical therapy over 3-4 weeks then reevaluating, considering MRI vs doing MRI now to assess for OCD.  Opted for the latter.  Continue with ASO, icing, tylenol, elevation, physical therapy in the meantime.

## 2017-09-17 MED FILL — FLUoxetine HCL 20 MG TABS: 20 | 30 days supply | Qty: 120 | Fill #1

## 2017-09-17 MED FILL — QUETIAPINE FUMARATE 200 MG: 200 | 30 days supply | Qty: 30 | Fill #1

## 2017-09-18 ENCOUNTER — Ambulatory Visit: Payer: No Typology Code available for payment source

## 2017-09-19 MED FILL — ULTICARE PEN NDL 8MM 31G: 31G X 8 MM | 90 days supply | Qty: 100 | Fill #0

## 2017-09-20 ENCOUNTER — Other Ambulatory Visit: Payer: Self-pay

## 2017-09-20 MED FILL — SAXENDA 18 MG/3 ML PEN: 18 | 30 days supply | Qty: 15 | Fill #0

## 2017-09-21 ENCOUNTER — Ambulatory Visit: Payer: No Typology Code available for payment source | Admitting: Physical Therapy

## 2017-09-25 ENCOUNTER — Ambulatory Visit: Payer: No Typology Code available for payment source

## 2017-09-26 NOTE — Progress Notes (Signed)
BH MD/PA/NP OP Progress Note  10/01/2017 11:33 AM Ashley Franklin  MRN:  [id number]  Chief Complaint:  Chief Complaint    Follow-up; Other     HPI:  Patient presents for follow-up appointment for bipolar 2 disorder.  She states that she has been out of work for two months due to injuring her ligament after a fall. Se is applying to transition the department as she notices that she has been feeling stressed at work. She goes to ICU and saw a family, who is facing the death of the patient. She states that her father in [place] will get bypass surgery. Although she feels concerned about it, she has "level of comfort" and does not feel overly stressed about it. She reports good relationship with her husband. She has occasional insomnia; she has not taken trazodone regularly. She had brief moments of feeling down. She has fair concentration. She has been working on diet with her husband and tries to lose her weight. She has fair appetite. She denies SI. She feels occasionally anxious and takes ativan up to three times a day at times. She denies panic attacks. She denies decreased need for sleep, euphoria or irritability.   Per PMP,  Ambien filled on 09/28/2017 ativan filled on 09/11/2017 I have utilized the [place] Controlled Substances Reporting System (PMP AWARxE) to confirm adherence regarding the patient's medication. My review reveals appropriate prescription fills.   Wt Readings from Last 3 Encounters:  10/01/17 232 lb (105.2 kg)  09/28/17 220 lb (99.8 kg)  09/14/17 220 lb (99.8 kg)     Visit Diagnosis:    ICD-10-CM   1. Bipolar affective disorder, currently depressed, mild (HCC) F31.31 Comprehensive Metabolic Panel (CMET)    Valproic Acid level    zolpidem (AMBIEN) 5 MG tablet    FLUoxetine (PROZAC) 20 MG tablet    QUEtiapine (SEROQUEL) 200 MG tablet    CBC    Past Psychiatric History:  I have reviewed the patient's psychiatry history in detail and updated the patient  record. Outpatient: [facility] when she relocated from [facility]. Previously she was seen at [facility] in [place]. She was diagnosed with borderline personality disorder. She takes antidepressant since 2005.  Psychiatry admission: one admission in March 2015 for depression and SI Previous suicide attempt: denies Past trials of medication: Lexapro, Paxil, amitriptyline, Abilify (irritable), oxcarbazepine, perphenazine, Trifluoperazine, Klonopin, Valium. Vistaril,  History of violence: denies   Past Medical History:  Past Medical History:  Diagnosis Date  . Anemia   . Anxiety   . Bipolar affective disorder (HCC)   . Blood transfusion without reported diagnosis   . Bronchospasm with bronchitis, acute   . Depression   . Kidney stone   . SBO (small bowel obstruction) (HCC) 01/2014    Past Surgical History:  Procedure Laterality Date  . CESAREAN SECTION    . ENDOMETRIAL ABLATION    . GASTRIC BYPASS  2001  . SBO  2015  . TUBAL LIGATION      Family Psychiatric History: I have reviewed the patient's family history in detail and updated the patient record.  Family History:  Family History  Problem Relation Age of Onset  . Diabetes Mother   . Cancer Mother 60       Throat   . Bipolar disorder Mother   . Bipolar disorder Sister   . Bipolar disorder Brother   . Drug abuse Brother   . Bipolar disorder Sister   .  Colon cancer Neg Hx   . Cancer - Prostate Neg Hx   . Breast cancer Neg Hx     Social History:  Social History   Socioeconomic History  . Marital status: Married    Spouse name: Not on file  . Number of children: 2  . Years of education: Not on file  . Highest education level: Not on file  Occupational History  . Occupation: phelbotomist    Employer: [employer]  Social Needs  . Financial resource strain: Not on file  . Food insecurity:    Worry: Not on file    Inability: Not on file  . Transportation needs:    Medical: Not on file     Non-medical: Not on file  Tobacco Use  . Smoking status: Former Smoker    Packs/day: 0.50    Years: 6.00    Pack years: 3.00    Last attempt to quit: 05/11/2014    Years since quitting: 3.3  . Smokeless tobacco: Never Used  . Tobacco comment: 1/2 ppd   Substance and Sexual Activity  . Alcohol use: No    Alcohol/week: 0.0 oz  . Drug use: No  . Sexual activity: Yes    Partners: Male    Birth control/protection: Surgical  Lifestyle  . Physical activity:    Days per week: Not on file    Minutes per session: Not on file  . Stress: Not on file  Relationships  . Social connections:    Talks on phone: Not on file    Gets together: Not on file    Attends religious service: Not on file    Active member of club or organization: Not on file    Attends meetings of clubs or organizations: Not on file    Relationship status: Not on file  Other Topics Concern  . 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 Nsaids Other (See Comments)    Contraindicated with gastric bypass    Metabolic Disorder Labs: Lab Results  Component Value Date   HGBA1C 4.9 02/06/2017   MPG 105 02/26/2015   No results found for: PROLACTIN Lab Results  Component Value Date   CHOL 165 02/06/2017   TRIG 87 02/06/2017   HDL 54 02/06/2017   LDLCALC 94 02/06/2017   Lab Results  Component Value Date   TSH 1.12 07/10/2017   TSH 1.22 06/08/2016    Therapeutic Level Labs: No results found for: LITHIUM Lab Results  Component Value Date   VALPROATE 43 (L) 02/06/2017   VALPROATE 33 (L) 05/26/2016   No components found for:  CBMZ  Current Medications: Current Outpatient  Medications  Medication Sig Dispense Refill  . divalproex (DEPAKOTE ER) 500 MG 24 hr tablet Take 3 tablets (1,500 mg total) by mouth at bedtime. 270 tablet 0  . FLUoxetine (PROZAC) 20 MG tablet Take 4 tablets (80 mg total) by mouth daily. 360 tablet 0  . lamoTRIgine (LAMICTAL) 25 MG tablet Take 1 tablet (25 mg total) by mouth daily. 90 tablet 0  . linaclotide (LINZESS) 290 MCG CAPS capsule Take  1 capsule (290 mcg total) by mouth daily before breakfast. 30 capsule 3  . LORazepam (ATIVAN) 1 MG tablet Take 1 tablet (1 mg total) by mouth 3 (three) times daily as needed for anxiety. 90 tablet 2  . SAXENDA 18 MG/3ML SOPN   0  . traZODone (DESYREL) 50 MG tablet 25-50 mg at night as needed for sleep 30 tablet 2  . valACYclovir (VALTREX) 1000 MG tablet Take 1,000 mg by mouth as needed.     . varenicline (CHANTIX CONTINUING MONTH PAK) 1 MG tablet Take 1 tablet (1 mg total) by mouth 2 (two) times daily. 60 tablet 2  . varenicline (CHANTIX STARTING MONTH PAK) 0.5 MG X 11 & 1 MG X 42 tablet Take one 0.5 mg tablet by mouth once daily for 3 days, then increase to one 0.5 mg tablet twice daily for 4 days, then increase to one 1 mg tablet twice daily. 53 tablet 0  . [START ON 10/29/2017] zolpidem (AMBIEN) 5 MG tablet Take 1 tablet (5 mg total) by mouth at bedtime. 30 tablet 2  . QUEtiapine (SEROQUEL) 100 MG tablet Take 1 tablet (100 mg total) by mouth 2 (two) times daily. 180 tablet 0  . QUEtiapine (SEROQUEL) 200 MG tablet Take 1 tablet (200 mg total) by mouth at bedtime. 90 tablet 0   No current facility-administered medications for this visit.      Musculoskeletal: Strength & Muscle Tone: within normal limits Gait & Station: normal Patient leans: N/A  Psychiatric Specialty Exam: Review of Systems  Psychiatric/Behavioral: Positive for depression. Negative for hallucinations, memory loss, substance abuse and suicidal ideas. The patient has insomnia. The patient is not nervous/anxious.   All other systems  reviewed and are negative.   Blood pressure 94/69, pulse 75, height  (1.626 m), weight 232 lb (105.2 kg), SpO2 97 %.Body mass index is 39.82 kg/m.  General Appearance: Fairly Groomed  Eye Contact:  Good  Speech:  Clear and Coherent  Volume:  Normal  Mood:  "good"  Affect:  Appropriate, Congruent and calmer, reactive  Thought Process:  Coherent  Orientation:  Full (Time, Place, and Person)  Thought Content: Logical   Suicidal Thoughts:  No  Homicidal Thoughts:  No  Memory:  Immediate;   Good  Judgement:  Good  Insight:  Fair  Psychomotor Activity:  Normal  Concentration:  Concentration: Good and Attention Span: Good  Recall:  Good  Fund of Knowledge: Good  Language: Good  Akathisia:  No  Handed:  Right  AIMS (if indicated): not done  Assets:  Communication Skills Desire for Improvement  ADL's:  Intact  Cognition: WNL  Sleep:  Fair   Screenings: PHQ2-9     Nutrition from [facility]/06/2017 [facility] Visit from 06/08/2016 in [facility] Office Visit from 03/15/2015 in [facility] Office Visit from 01/12/2015 in [facility]  PHQ-2 Total Score  0  0  0  0  PHQ-9 Total Score  -  2  -  -       Assessment and Plan:  Ashley Franklin is a 47 y.o. year old female with a history of bipolar II disorder, anxiety , anemia, obesity s/p gastric bypass surgery, who presents for follow up appointment for Bipolar affective disorder, currently depressed, mild (HCC) - Plan: Comprehensive Metabolic Panel (CMET), Valproic Acid level, zolpidem (AMBIEN) 5 MG tablet, FLUoxetine (PROZAC) 20 MG tablet, QUEtiapine (SEROQUEL) 200  MG tablet, CBC  # Bipolar II disorder, mixed # Unspecified anxiety disorder There has been overall improvement in mood symptoms since the last appointment.  Will continue current medication regimen given patient preference.  Will continue  Depakote to target mood dysregulation.  Will obtain blood test.  Will continue lamotrigine for bipolar disorder.  Will continue fluoxetine for depression.  Will continue quetiapine for mood dysregulation.  Discussed metabolic side effect.  Will continue Ativan as needed for anxiety .  Discussed risk of dependence and oversedation.  Will continue Ambien and trazodone as needed for insomnia.  Discussed behavioral activation.   Plan: I have reviewed and updated plans as below 1. Continue Depakote 1500 mg at night  3. Continue fluoxetine 80 mg daily  4. Continue lamotrigine 25 mg daily 5. Continue quetiapine 100 mg twice a day and 200 mg at night 6. Continue ativan 1 mg three times a day as needed for anxiety 7. ContinueAmbien5 mg at night as needed for sleep  8.ContinueTrazodone 25-50 mg at night as needed for sleep 9.Return to clinic inthree monthsfor 30 mins  Obtain blood test (VPA, CMP, CBC)  I have reviewed suicide assessment in detail. No change in the following assessment.   The patient demonstrates the following risk factors for suicide: Chronic risk factorsfor suicide include psychiatric disorder /bipolar disorder,previous self-harm of scratching herself. Acute risk factorsfor suicide includenone. Protective factorsfor this patient include positive social support, positive therapeutic relationship,hope for the future. Considering these factors, the overall suicide risk at this point appears to be low. Patient does have gun access at home, which she declined to lock. Discussed in detail safety plan that anytime having active suicidal thoughts or homicidal thoughts and she need to call 911 or go to local emergency room.   The duration of this appointment visit was 30 minutes of face-to-face time with the patient.  Greater than 50% of this time was spent in counseling, explanation of  diagnosis, planning of further management, and coordination of care.  [name],  MD 10/01/2017, 11:33 AM

## 2017-09-27 ENCOUNTER — Ambulatory Visit
Admission: RE | Admit: 2017-09-27 | Discharge: 2017-09-27 | Disposition: A | Discharge status: Home / Self Care | Payer: No Typology Code available for payment source | Source: Ambulatory Visit | Attending: Family Medicine | Admitting: Family Medicine

## 2017-09-27 DIAGNOSIS — S99912D Unspecified injury of left ankle, subsequent encounter: Secondary | ICD-10-CM

## 2017-09-28 ENCOUNTER — Encounter: Payer: Self-pay | Admitting: Physical Therapy

## 2017-09-28 ENCOUNTER — Encounter: Payer: Self-pay | Admitting: Family Medicine

## 2017-09-28 ENCOUNTER — Ambulatory Visit: Payer: No Typology Code available for payment source | Admitting: Physical Therapy

## 2017-09-28 ENCOUNTER — Ambulatory Visit (INDEPENDENT_AMBULATORY_CARE_PROVIDER_SITE_OTHER): Payer: No Typology Code available for payment source | Admitting: Family Medicine

## 2017-09-28 VITALS — BP 93/68 | HR 96 | Ht 64.0 in | Wt 220.0 lb

## 2017-09-28 DIAGNOSIS — M25572 Pain in left ankle and joints of left foot: Secondary | ICD-10-CM | POA: Diagnosis not present

## 2017-09-28 DIAGNOSIS — S99912D Unspecified injury of left ankle, subsequent encounter: Secondary | ICD-10-CM

## 2017-09-28 DIAGNOSIS — M6281 Muscle weakness (generalized): Secondary | ICD-10-CM

## 2017-09-28 DIAGNOSIS — R262 Difficulty in walking, not elsewhere classified: Secondary | ICD-10-CM

## 2017-09-28 DIAGNOSIS — M25672 Stiffness of left ankle, not elsewhere classified: Secondary | ICD-10-CM

## 2017-09-28 MED FILL — lamoTRIgine 25 MG TABS: 25 | 30 days supply | Qty: 30 | Fill #1

## 2017-09-28 MED FILL — ZOLPIDEM TARTRATE 5 MG TAB: 5 | 30 days supply | Qty: 30 | Fill #2

## 2017-09-28 NOTE — Progress Notes (Signed)
Patient here today to review MRI.  No evidence osteochondral defect or other concerning findings.  She has evidence of high grade ATFL sprain as expected.  Reassured patient.  Continue with physical therapy, ASO.  Plan to get 3-4 more visits of PT in with home exercises then return with hours and standing/walking restrictions in 2 weeks, advance the next week, follow up with me in 4 weeks.

## 2017-09-28 NOTE — Therapy (Signed)
[facility] Outpatient Rehabilitation [facility] [address]  [address] [place], [place], [address] Phone: [phone]   Fax:  [phone]  Physical Therapy Treatment  Patient Details  Name: Ashley Franklin MRN: [id number] Date of Birth: August 23, 1970 Referring Provider: Norton Blizzard, MD   Encounter Date: 09/28/2017  PT End of Session - 09/28/17 1016    Visit Number  4    Number of Visits  12    Date for PT Re-Evaluation  10/19/17    Authorization Type  [place] [employer]    PT Start Time  1016    PT Stop Time  1106    PT Time Calculation (min)  50 min    Activity Tolerance  Patient tolerated treatment well    Behavior During Therapy  [facility] for tasks assessed/performed       Past Medical History:  Diagnosis Date  . Anemia   . Anxiety   . Bipolar affective disorder (HCC)   . Blood transfusion without reported diagnosis   . Bronchospasm with bronchitis, acute   . Depression   . Kidney stone   . SBO (small bowel obstruction) (HCC) 01/2014    Past Surgical History:  Procedure Laterality Date  . CESAREAN SECTION    . ENDOMETRIAL ABLATION    . GASTRIC BYPASS  2001  . SBO  2015  . TUBAL LIGATION      There were no vitals filed for this visit.  Subjective Assessment - 09/28/17 1020    Subjective  Pain stays about a constant 3/10, increasing to 5-6/10 with some activities, esp prolonged standing or walking.    Diagnostic tests  3/21 Effusion noted along with soft tissue swelling.  No cortical irregularities of distal fibula, fifth through third metatarsals.  No edema overlying the cortices of these bones either.  Peroneal tendons are intact without tenosynovitis.  Talar dome also intact without avulsion.    Patient Stated Goals  Reduce Pain; Increase Walking Threshold;    Currently in Pain?  Yes    Pain Score  3     Pain Location  Ankle    Pain Orientation  Left;Lateral;Medial    Pain Descriptors / Indicators  Tender    Pain Type  Acute pain    Pain  Frequency  Constant                       [facility] Adult PT Treatment/Exercise - 09/28/17 1016      Exercises   Exercises  Ankle      Vasopneumatic   Number Minutes Vasopneumatic   10 minutes    Vasopnuematic Location   Ankle    Vasopneumatic Pressure  High    Vasopneumatic Temperature   coldest temp.      Ankle Exercises: Aerobic   Nustep  L4 x 6' (LE only)      Ankle Exercises: Standing   Vector Stance  Left;5 reps 2 sets    Vector Stance Limitations  4 way clocks - 1 pole A    Rebounder  B heel/toe, lateral & R/L staggered foot front/back weight shifting x 20 each, marching x20; intermittent light UE assist    Heel Raises  Both;10 reps;3 seconds    Heel Raises Limitations  negative heel on back of UBE    Other Standing Ankle Exercises  B side stepping with yellow TB at forefoot 2 x 16ft      Ankle Exercises: Seated   Other Seated Ankle Exercises  L 4 way ankle with red TB x15               PT Short Term Goals - 09/14/17 1610      PT SHORT TERM GOAL #1   Title  Independent w/ inital HEP     Status  Achieved        PT Long Term Goals - 09/11/17 1126      PT LONG TERM GOAL #1   Title  Independent w/ ongoing HEP    Status  On-going      PT LONG TERM GOAL #2   Title  Pt. will demonstrate an increase in L ankle ROM to [facility] to allow for functional mobility     Status  On-going      PT LONG TERM GOAL #3   Title  Pt. will demonstrate increase in L ankle strength to >/= 4/5 to allow for increased ankle stability    Status  On-going      PT LONG TERM GOAL #4   Title  Pt. will report an increase in walking tolerance to >/= 30 minutes w/o pain     Status  On-going            Plan - 09/28/17 1023    Clinical Impression Statement  Spring returning to PT after ~2 week absence while awaiting MRI of ankle - MRI completed yesterday & pt followed up with Dr. Pearletha Forge today who informed her that the MRI was unconcerning and that she should return to  PT to increase the intenisty of her rehab. Progressed theraband resistance to red TB for HEP and introduced more weight shifting activitis in standing on firm and soft surfaces - fatigue noted with many activites, therefore no new exercises added to HEP.    PT Treatment/Interventions  ADLs/Self Care Home Management;Cryotherapy;Moist Heat;Iontophoresis /ml Dexamethasone;Gait training;Therapeutic exercise;Ultrasound;Electrical Stimulation;Patient/family education;Neuromuscular re-education;Manual techniques;Dry needling;Passive range of motion;Vasopneumatic Device;Taping;Balance training;Therapeutic activities;Stair training    Consulted and Agree with Plan of Care  Patient       Patient will benefit from skilled therapeutic intervention in order to improve the following deficits and impairments:  Abnormal gait, Pain, Decreased activity tolerance, Decreased endurance, Decreased range of motion, Decreased strength, Increased edema, Impaired flexibility, Increased muscle spasms, Difficulty walking, Decreased balance, Impaired perceived functional ability  Visit Diagnosis: Pain in left ankle and joints of left foot  Stiffness of left ankle, not elsewhere classified  Difficulty in walking, not elsewhere classified  Muscle weakness (generalized)     Problem List Patient Active Problem List   Diagnosis Date Noted  . Left ankle injury, subsequent encounter 08/04/2017  . PCP NOTES >>>>>>>>>> 07/11/2017  . 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 Chronic knee pain 04/18/2015  . Plantar fasciitis 04/18/2015  . Tobacco use disorder 03/15/2015  . Morbid obesity (HCC) 01/05/2015  . Bipolar affective disorder (HCC) 01/02/2015    Marry Guan, PT, MPT 09/28/2017, 11:14 AM  [facility] [address]  [address] [place], [place], [address] Phone: [phone]   Fax:  [phone]  Name: Ashley Franklin MRN: [id number] Date of Birth: [birth year]

## 2017-09-28 NOTE — Patient Instructions (Signed)
Your MRI looks great - you do have evidence of a severe ankle sprain that is healing with some bone bruising as expected but no osteochondral defect or other concerning findings. Continue with physical therapy, ankle brace, home exercises. Icing, elevation as you have been. Start back to work in 2 weeks for half-shifts with standing/walking restrictions. Follow up with me in 4 weeks for reevaluation.

## 2017-10-01 ENCOUNTER — Encounter (HOSPITAL_COMMUNITY): Payer: Self-pay | Admitting: Psychiatry

## 2017-10-01 ENCOUNTER — Ambulatory Visit (INDEPENDENT_AMBULATORY_CARE_PROVIDER_SITE_OTHER): Payer: No Typology Code available for payment source | Admitting: Psychiatry

## 2017-10-01 VITALS — BP 94/69 | HR 75 | Ht 64.0 in | Wt 232.0 lb

## 2017-10-01 DIAGNOSIS — Z813 Family history of other psychoactive substance abuse and dependence: Secondary | ICD-10-CM | POA: Diagnosis not present

## 2017-10-01 DIAGNOSIS — F3131 Bipolar disorder, current episode depressed, mild: Secondary | ICD-10-CM

## 2017-10-01 DIAGNOSIS — G4700 Insomnia, unspecified: Secondary | ICD-10-CM | POA: Diagnosis not present

## 2017-10-01 DIAGNOSIS — Z818 Family history of other mental and behavioral disorders: Secondary | ICD-10-CM | POA: Diagnosis not present

## 2017-10-01 DIAGNOSIS — Z87891 Personal history of nicotine dependence: Secondary | ICD-10-CM

## 2017-10-01 MED ORDER — LORAZEPAM 1 MG PO TABS: 1.0000 mg | ORAL_TABLET | Freq: Three times a day (TID) | ORAL | 2 refills | Status: DC | PRN

## 2017-10-01 MED ORDER — LAMOTRIGINE 25 MG PO TABS: 25.0000 mg | ORAL_TABLET | Freq: Every day | ORAL | 0 refills | Status: DC

## 2017-10-01 MED ORDER — FLUOXETINE HCL 20 MG PO TABS: 80.0000 mg | ORAL_TABLET | Freq: Every day | ORAL | 0 refills | Status: DC

## 2017-10-01 MED ORDER — QUETIAPINE FUMARATE 100 MG PO TABS: 100.0000 mg | ORAL_TABLET | Freq: Two times a day (BID) | ORAL | 0 refills | Status: DC

## 2017-10-01 MED ORDER — QUETIAPINE FUMARATE 200 MG PO TABS: 200.0000 mg | ORAL_TABLET | Freq: Every day | ORAL | 0 refills | Status: DC

## 2017-10-01 MED ORDER — DIVALPROEX SODIUM ER 500 MG PO TB24: 1500.0000 mg | ORAL_TABLET | Freq: Every day | ORAL | 0 refills | Status: DC

## 2017-10-01 MED ORDER — ZOLPIDEM TARTRATE 5 MG PO TABS: 5.0000 mg | ORAL_TABLET | Freq: Every day | ORAL | 2 refills | Status: DC

## 2017-10-01 NOTE — Patient Instructions (Signed)
1. Continue Depakote 1500 mg at night  3. Continue fluoxetine 80 mg daily  4. Continue lamotrigine 25 mg daily 5. Continue quetiapine 100 mg twice a day and 200 mg at night 6. Continue ativan 1 mg three times a day as needed for anxiety 7. ContinueAmbien5 mg at night as needed for sleep  8.ContinueTrazodone 25-50 mg at night as needed for sleep 9.Return to clinic inthree monthsfor 30 mins  Obtain blood test

## 2017-10-02 ENCOUNTER — Ambulatory Visit: Payer: No Typology Code available for payment source

## 2017-10-02 DIAGNOSIS — M25672 Stiffness of left ankle, not elsewhere classified: Secondary | ICD-10-CM

## 2017-10-02 DIAGNOSIS — M25572 Pain in left ankle and joints of left foot: Secondary | ICD-10-CM

## 2017-10-02 DIAGNOSIS — M6281 Muscle weakness (generalized): Secondary | ICD-10-CM

## 2017-10-02 DIAGNOSIS — R262 Difficulty in walking, not elsewhere classified: Secondary | ICD-10-CM

## 2017-10-02 NOTE — Therapy (Addendum)
[facility] Outpatient Rehabilitation [facility] [address]  [address] [place], [place], [address] Phone: [phone]   Fax:  [phone]  Physical Therapy Treatment  Patient Details  Name: Ashley Franklin MRN: [id number] Date of Birth: 1971/03/04 Referring Provider: Norton Blizzard, MD   Encounter Date: 10/02/2017  PT End of Session - 10/02/17 1107    Visit Number  5    Number of Visits  12    Date for PT Re-Evaluation  10/19/17    Authorization Type  [place] [employer]    PT Start Time  1103    PT Stop Time  1155    PT Time Calculation (min)  52 min    Activity Tolerance  Patient tolerated treatment well    Behavior During Therapy  [facility] for tasks assessed/performed       Past Medical History:  Diagnosis Date  . Anemia   . Anxiety   . Bipolar affective disorder (HCC)   . Blood transfusion without reported diagnosis   . Bronchospasm with bronchitis, acute   . Depression   . Kidney stone   . SBO (small bowel obstruction) (HCC) 01/2014    Past Surgical History:  Procedure Laterality Date  . CESAREAN SECTION    . ENDOMETRIAL ABLATION    . GASTRIC BYPASS  2001  . SBO  2015  . TUBAL LIGATION      There were no vitals filed for this visit.  Subjective Assessment - 10/02/17 1105    Subjective  Pt. reporting her pain levels have improved recently.  Notes pain for rest of day following last visit however this resolved next day.  Pt. reporting she is able to walk short distances without brace on level surface without any pain.      How long can you stand comfortably?  1 hour    How long can you walk comfortably?  1 hour    Diagnostic tests  3/21 Effusion noted along with soft tissue swelling.  No cortical irregularities of distal fibula, fifth through third metatarsals.  No edema overlying the cortices of these bones either.  Peroneal tendons are intact without tenosynovitis.  Talar dome also intact without avulsion.    Patient Stated Goals  Reduce Pain;  Increase Walking Threshold;    Currently in Pain?  No/denies    Pain Score  0-No pain    Multiple Pain Sites  No                       [facility] Adult PT Treatment/Exercise - 10/02/17 1112      Neuro Re-ed    Neuro Re-ed Details   Alternating standing march, hip abduction, extension x 10 reps each standing on airex; light UE support on counter       Vasopneumatic   Number Minutes Vasopneumatic   10 minutes    Vasopnuematic Location   Ankle    Vasopneumatic Pressure  High    Vasopneumatic Temperature   coldest temp.      Ankle Exercises: Standing   Vector Stance  Left 2 x 20 reps     Vector Stance Limitations  4 way clocks - light UE support on chair     Heel Raises  3 seconds x 12 reps     Heel Raises Limitations  negative heel on back of UBE    Other Standing Ankle Exercises  B side stepping, monster walk forward/backwards with yellow TB at forefoot 4 x 60ft  Other Standing Ankle Exercises  Tandem forward/backward walk at counter 3 x 15 ft       Ankle Exercises: Aerobic   Nustep  L4 x 6' (LE only)             PT Education - 10/02/17 1206    Education provided  Yes    Education Details  HEP update    Person(s) Educated  Patient    Methods  Explanation;Demonstration;Verbal cues;Handout    Comprehension  Verbalized understanding;Returned demonstration;Verbal cues required;Need further instruction       PT Short Term Goals - 09/14/17 9562      PT SHORT TERM GOAL #1   Title  Independent w/ inital HEP     Status  Achieved        PT Long Term Goals - 09/11/17 1126      PT LONG TERM GOAL #1   Title  Independent w/ ongoing HEP    Status  On-going      PT LONG TERM GOAL #2   Title  Pt. will demonstrate an increase in L ankle ROM to [facility] to allow for functional mobility     Status  On-going      PT LONG TERM GOAL #3   Title  Pt. will demonstrate increase in L ankle strength to >/= 4/5 to allow for increased ankle stability    Status  On-going       PT LONG TERM GOAL #4   Title  Pt. will report an increase in walking tolerance to >/= 30 minutes w/o pain     Status  On-going            Plan - 10/02/17 1107    Clinical Impression Statement  Ashley Franklin reporting she had half a day of "soreness" at L ankle following last visit, which resolved.  Pt. tolerated progression of standing ankle stability activities today well and HEP updated accordingly.  Only with L ankle pain increase to 3/10 level with foam standing activities today.  Did require cueing today for slow pacing and proper positioning with therex activities.  Ended treatment with ice/compression to L ankle to decrease post-exercise swelling and pain.    PT Treatment/Interventions  ADLs/Self Care Home Management;Cryotherapy;Moist Heat;Iontophoresis /ml Dexamethasone;Gait training;Therapeutic exercise;Ultrasound;Electrical Stimulation;Patient/family education;Neuromuscular re-education;Manual techniques;Dry needling;Passive range of motion;Vasopneumatic Device;Taping;Balance training;Therapeutic activities;Stair training    Consulted and Agree with Plan of Care  Patient       Patient will benefit from skilled therapeutic intervention in order to improve the following deficits and impairments:  Abnormal gait, Pain, Decreased activity tolerance, Decreased endurance, Decreased range of motion, Decreased strength, Increased edema, Impaired flexibility, Increased muscle spasms, Difficulty walking, Decreased balance, Impaired perceived functional ability  Visit Diagnosis: Pain in left ankle and joints of left foot  Stiffness of left ankle, not elsewhere classified  Difficulty in walking, not elsewhere classified  Muscle weakness (generalized)     Problem List Patient Active Problem List   Diagnosis Date Noted  . Left ankle injury, subsequent encounter 08/04/2017  . PCP NOTES >>>>>>>>>> 07/11/2017  . 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 Chronic knee pain 04/18/2015  . Plantar  fasciitis 04/18/2015  . Tobacco use disorder 03/15/2015  . Morbid obesity (HCC) 01/05/2015  . Bipolar affective disorder [facility]) 01/02/2015    Kermit Balo, PTA 10/02/17 12:06 PM  [facility] [address]  [address] [place], [place], [address] Phone: [phone]   Fax:  [phone]  Name: Ashley Franklin  MRN: [id number] Date of Birth: Sep 29, 1970

## 2017-10-05 ENCOUNTER — Ambulatory Visit: Payer: No Typology Code available for payment source

## 2017-10-05 DIAGNOSIS — M25572 Pain in left ankle and joints of left foot: Secondary | ICD-10-CM | POA: Diagnosis not present

## 2017-10-05 DIAGNOSIS — R262 Difficulty in walking, not elsewhere classified: Secondary | ICD-10-CM

## 2017-10-05 DIAGNOSIS — M6281 Muscle weakness (generalized): Secondary | ICD-10-CM

## 2017-10-05 DIAGNOSIS — M25672 Stiffness of left ankle, not elsewhere classified: Secondary | ICD-10-CM

## 2017-10-05 NOTE — Therapy (Signed)
[facility] Outpatient Rehabilitation [facility] [address]  [address] [place], [place], [address] Phone: [phone]   Fax:  [phone]  Physical Therapy Treatment  Patient Details  Name: Ashley Franklin MRN: [id number] Date of Birth: 04-Oct-1970 Referring Provider: Norton Blizzard, MD   Encounter Date: 10/05/2017  PT End of Session - 10/05/17 0840    Visit Number  6    Number of Visits  12    Date for PT Re-Evaluation  10/19/17    Authorization Type  [facility] [employer]    PT Start Time  [phone]    PT Stop Time  0933    PT Time Calculation (min)  58 min    Activity Tolerance  Patient tolerated treatment well    Behavior During Therapy  [facility] for tasks assessed/performed       Past Medical History:  Diagnosis Date  . Anemia   . Anxiety   . Bipolar affective disorder (HCC)   . Blood transfusion without reported diagnosis   . Bronchospasm with bronchitis, acute   . Depression   . Kidney stone   . SBO (small bowel obstruction) (HCC) 01/2014    Past Surgical History:  Procedure Laterality Date  . CESAREAN SECTION    . ENDOMETRIAL ABLATION    . GASTRIC BYPASS  2001  . SBO  2015  . TUBAL LIGATION      There were no vitals filed for this visit.  Subjective Assessment - 10/05/17 0839    Subjective  Pt. noting she still uses brace ~ 50% of time while walking which reduces her L ankle pain levels however is still not painfree while walking.      Diagnostic tests  3/21 Effusion noted along with soft tissue swelling.  No cortical irregularities of distal fibula, fifth through third metatarsals.  No edema overlying the cortices of these bones either.  Peroneal tendons are intact without tenosynovitis.  Talar dome also intact without avulsion.    Patient Stated Goals  Reduce Pain; Increase Walking Threshold;    Currently in Pain?  Yes    Pain Score  2     Pain Location  Ankle    Pain Orientation  Left;Lateral    Pain Descriptors / Indicators  Tender    Pain Type   Acute pain    Pain Onset  More than a month ago    Pain Frequency  Constant    Multiple Pain Sites  No                       [facility] Adult PT Treatment/Exercise - 10/05/17 0102      Neuro Re-ed    Neuro Re-ed Details   B staggered stance on compliant surface with eyes closed x 1 min each way at counter       Vasopneumatic   Number Minutes Vasopneumatic   10 minutes    Vasopnuematic Location   Ankle    Vasopneumatic Pressure  High    Vasopneumatic Temperature   coldest temp.      Ankle Exercises: Standing   Vector Stance Limitations  4 way clocks - no UE support 2 x 30 sec     Heel Raises  15 reps;Both;3 seconds    Heel Raises Limitations  negative heel on back of UBE    Heel Walk (Round Trip)  2 laps down/back at counter     Toe Walk (Round Trip)  2 laps down/back at counter  Balance Beam  3 laps down/back on foam balance beam with yellow TB at forefoot     Other Standing Ankle Exercises  Alternating cone nock over/righting 2 x 7 cones       Ankle Exercises: Aerobic   Recumbent Bike  Lvl 2, 7 min      Ankle Exercises: Seated   Other Seated Ankle Exercises  L 4 way ankle EV, IV (red TB), DF, PF (green TB) x 15      Ankle Exercises: Stretches   Gastroc Stretch  1 rep;60 seconds    Gastroc Stretch Limitations  prostretch                PT Short Term Goals - 09/14/17 0808      PT SHORT TERM GOAL #1   Title  Independent w/ inital HEP     Status  Achieved        PT Long Term Goals - 09/11/17 1126      PT LONG TERM GOAL #1   Title  Independent w/ ongoing HEP    Status  On-going      PT LONG TERM GOAL #2   Title  Pt. will demonstrate an increase in L ankle ROM to [facility] to allow for functional mobility     Status  On-going      PT LONG TERM GOAL #3   Title  Pt. will demonstrate increase in L ankle strength to >/= 4/5 to allow for increased ankle stability    Status  On-going      PT LONG TERM GOAL #4   Title  Pt. will report an increase in  walking tolerance to >/= 30 minutes w/o pain     Status  On-going            Plan - 10/05/17 0841    Clinical Impression Statement  Spring doing well today.  Tolerated progression of standing ankle stabilization and proprioception training activities well today.  Reported pain remaining bellow 3/10 level today.  Will continue to progress toward goals.      PT Treatment/Interventions  ADLs/Self Care Home Management;Cryotherapy;Moist Heat;Iontophoresis /ml Dexamethasone;Gait training;Therapeutic exercise;Ultrasound;Electrical Stimulation;Patient/family education;Neuromuscular re-education;Manual techniques;Dry needling;Passive range of motion;Vasopneumatic Device;Taping;Balance training;Therapeutic activities;Stair training    Consulted and Agree with Plan of Care  Patient       Patient will benefit from skilled therapeutic intervention in order to improve the following deficits and impairments:  Abnormal gait, Pain, Decreased activity tolerance, Decreased endurance, Decreased range of motion, Decreased strength, Increased edema, Impaired flexibility, Increased muscle spasms, Difficulty walking, Decreased balance, Impaired perceived functional ability  Visit Diagnosis: Pain in left ankle and joints of left foot  Stiffness of left ankle, not elsewhere classified  Difficulty in walking, not elsewhere classified  Muscle weakness (generalized)     Problem List Patient Active Problem List   Diagnosis Date Noted  . Left ankle injury, subsequent encounter 08/04/2017  . PCP NOTES >>>>>>>>>> 07/11/2017  . 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 Chronic knee pain 04/18/2015  . Plantar fasciitis 04/18/2015  . Tobacco use disorder 03/15/2015  . Morbid obesity (HCC) 01/05/2015  . Bipolar affective disorder [facility]) 01/02/2015    Kermit Balo, PTA 10/05/17 12:50 PM  [facility] [address]  [address] [place], [place], [address] Phone:  [phone]   Fax:  [phone]  Name: DEERICA WASZAK MRN: [id number] Date of Birth: Apr 19, 1971

## 2017-10-10 ENCOUNTER — Ambulatory Visit: Payer: No Typology Code available for payment source

## 2017-10-12 ENCOUNTER — Ambulatory Visit: Payer: No Typology Code available for payment source

## 2017-10-12 DIAGNOSIS — M6281 Muscle weakness (generalized): Secondary | ICD-10-CM

## 2017-10-12 DIAGNOSIS — R262 Difficulty in walking, not elsewhere classified: Secondary | ICD-10-CM

## 2017-10-12 DIAGNOSIS — M25572 Pain in left ankle and joints of left foot: Secondary | ICD-10-CM | POA: Diagnosis not present

## 2017-10-12 DIAGNOSIS — M25672 Stiffness of left ankle, not elsewhere classified: Secondary | ICD-10-CM

## 2017-10-12 MED FILL — CHANTIX 1 MG TABLET: 1 | 28 days supply | Qty: 56 | Fill #2

## 2017-10-12 MED FILL — QUETIAPINE FUMARATE 100 MG: 100 | 30 days supply | Qty: 60 | Fill #1

## 2017-10-12 MED FILL — DIVALPROEX SOD ER 500 MG TA: 500 | 30 days supply | Qty: 90 | Fill #2

## 2017-10-12 NOTE — Therapy (Signed)
[facility] [address]  [address] [place], [place], [address] Phone: [phone]   Fax:  [phone]  Physical Therapy Treatment  Patient Details  Name: Ashley Franklin MRN: [id number] Date of Birth: [birth year] Referring Provider: Norton Blizzard, MD   Encounter Date: 10/12/2017  PT End of Session - 10/12/17 0852    Visit Number  7    Number of Visits  12    Date for PT Re-Evaluation  10/19/17    Authorization Type  [facility]    PT Start Time  0845    PT Stop Time  0940    PT Time Calculation (min)  55 min    Activity Tolerance  Patient tolerated treatment well    Behavior During Therapy  [facility] for tasks assessed/performed       Past Medical History:  Diagnosis Date  . Anemia   . Anxiety   . Bipolar affective disorder (HCC)   . Blood transfusion without reported diagnosis   . Bronchospasm with bronchitis, acute   . Depression   . Kidney stone   . SBO (small bowel obstruction) (HCC) 01/2014    Past Surgical History:  Procedure Laterality Date  . CESAREAN SECTION    . ENDOMETRIAL ABLATION    . GASTRIC BYPASS  2001  . SBO  2015  . TUBAL LIGATION      There were no vitals filed for this visit.  Subjective Assessment - 10/12/17 0848    Subjective  Pt. noting she, "pivoted wrong", on L ankle yesterday and had increased "stinging" pain and has since been wearing ankle brace 90% of time.      How long can you stand comfortably?  2 hours    How long can you walk comfortably?  2 hours     Diagnostic tests  3/21 Effusion noted along with soft tissue swelling.  No cortical irregularities of distal fibula, fifth through third metatarsals.  No edema overlying the cortices of these bones either.  Peroneal tendons are intact without tenosynovitis.  Talar dome also intact without avulsion.    Patient Stated Goals  Reduce Pain; Increase Walking Threshold;    Currently in Pain?  Yes    Pain Score  3     Pain Location   Ankle    Pain Orientation  Left;Lateral    Pain Descriptors / Indicators  -- "ice hot sensation"    Pain Type  Acute pain    Pain Onset  More than a month ago    Pain Frequency  Intermittent    Aggravating Factors   Quick pivoting     Pain Relieving Factors  ice    Multiple Pain Sites  No                       [facility] Adult PT Treatment/Exercise - 10/12/17 0857      Neuro Re-ed    Neuro Re-ed Details   Tandem walk forward/backwards on foam balance beam x 3 laps; intermittent therapist CGA; Corner balance: narrrow stance on airex pad with chair eyes closed with horizontal head turns and diagonal head turns 2 x 30 sec       Vasopneumatic   Number Minutes Vasopneumatic   10 minutes    Vasopnuematic Location   Ankle    Vasopneumatic Pressure  High    Vasopneumatic Temperature   coldest temp.      Ankle Exercises: Standing   SLS  L SLS with alternating toe-touch to cones 3 x 20 sec; no UE support  last set with SLS on foam and 1 ski pole - tolerated well     Heel Walk (Round Trip)  x 50 ft in hallway     Toe Walk (Round Trip)  x 50 ft in hallway     Balance Beam  4 laps down/back on foam balance beam with yellow TB at forefoot  no UE support       Ankle Exercises: Stretches   Soleus Stretch  1 rep;30 seconds    Soleus Stretch Limitations  at wall     Gastroc Stretch  1 rep;30 seconds    Gastroc Stretch Limitations  at wall                PT Short Term Goals - 09/14/17 0808      PT SHORT TERM GOAL #1   Title  Independent w/ inital HEP     Status  Achieved        PT Long Term Goals - 09/11/17 1126      PT LONG TERM GOAL #1   Title  Independent w/ ongoing HEP    Status  On-going      PT LONG TERM GOAL #2   Title  Pt. will demonstrate an increase in L ankle ROM to [facility] to allow for functional mobility     Status  On-going      PT LONG TERM GOAL #3   Title  Pt. will demonstrate increase in L ankle strength to >/= 4/5 to allow for increased ankle  stability    Status  On-going      PT LONG TERM GOAL #4   Title  Pt. will report an increase in walking tolerance to >/= 30 minutes w/o pain     Status  On-going            Plan - 10/12/17 0853    Clinical Impression Statement  Spring reporting improved walking and standing tolerance to ~ 2 hours each before needing to take a break due to L ankle discomfort.  Did have some increased L ankle pain yesterday due to "pivoting wrong", on ankle however notes this has resolved.  Pt. tolerated progression of ankle proprioception training on complaint surface and increased distance with heel/toe walk today well.  Does require occasional cueing to for proper pacing with activities.  Seems to be progressing well at this point.      PT Treatment/Interventions  ADLs/Self Care Home Management;Cryotherapy;Moist Heat;Iontophoresis /ml Dexamethasone;Gait training;Therapeutic exercise;Ultrasound;Electrical Stimulation;Patient/family education;Neuromuscular re-education;Manual techniques;Dry needling;Passive range of motion;Vasopneumatic Device;Taping;Balance training;Therapeutic activities;Stair training    Consulted and Agree with Plan of Care  Patient       Patient will benefit from skilled therapeutic intervention in order to improve the following deficits and impairments:  Abnormal gait, Pain, Decreased activity tolerance, Decreased endurance, Decreased range of motion, Decreased strength, Increased edema, Impaired flexibility, Increased muscle spasms, Difficulty walking, Decreased balance, Impaired perceived functional ability  Visit Diagnosis: Pain in left ankle and joints of left foot  Stiffness of left ankle, not elsewhere classified  Difficulty in walking, not elsewhere classified  Muscle weakness (generalized)     Problem List Patient Active Problem List   Diagnosis Date Noted  . Left ankle injury, subsequent encounter 08/04/2017  . PCP NOTES >>>>>>>>>> 07/11/2017  . Apneic episode  06/25/2015  . Chronic knee pain 04/18/2015  . Plantar fasciitis 04/18/2015  . Tobacco use disorder 03/15/2015  .  Morbid obesity (HCC) 01/05/2015  . Bipolar affective disorder [facility]) 01/02/2015    Kermit Balo, PTA 10/12/17 9:40 AM   [facility] [address]  [address] [place], [place], [address] Phone: [phone]   Fax:  [phone]  Name: MICHILLE MCELRATH MRN: [id number] Date of Birth: Jan 24, 1971

## 2017-10-15 ENCOUNTER — Encounter: Payer: Self-pay | Admitting: Internal Medicine

## 2017-10-15 ENCOUNTER — Ambulatory Visit (AMBULATORY_SURGERY_CENTER): Payer: No Typology Code available for payment source | Admitting: Internal Medicine

## 2017-10-15 VITALS — BP 128/88 | HR 66 | Temp 96.9°F | Resp 12 | Ht 64.0 in | Wt 220.0 lb

## 2017-10-15 DIAGNOSIS — R194 Change in bowel habit: Secondary | ICD-10-CM | POA: Diagnosis not present

## 2017-10-15 DIAGNOSIS — K625 Hemorrhage of anus and rectum: Secondary | ICD-10-CM

## 2017-10-15 MED ORDER — PRUCALOPRIDE SUCCINATE 2 MG PO TABS: 2.0000 mg | ORAL_TABLET | Freq: Every day | ORAL | 0 refills | Status: DC

## 2017-10-15 MED ORDER — SODIUM CHLORIDE 0.9 % IV SOLN: 500.0000 mL | Freq: Once | INTRAVENOUS | Status: DC

## 2017-10-15 NOTE — Progress Notes (Signed)
Report to PACU, RN, vss, BBS= Clear.  

## 2017-10-15 NOTE — Patient Instructions (Addendum)
I did not see any major problems.  Due to your constipation the colon was anly prepped to a fair degree. No major polyps/tumors seen - meaning nothing that would cause a blockage or constipation.  I think the bleeding was likely irritation at anorectum from hard stools.  I am giving you a sample of Motegrity to try use it instead of Linzess starting tomorrow - if you think that works better than Linzess let me know and we will see if we can Rx it.   I appreciate the opportunity to care for you. Iva Booparl E. Aldena Worm, MD, [facility]   Repeat colonoscopy at age 47 for screening.   YOU HAD AN ENDOSCOPIC PROCEDURE TODAY AT THE [facility]:   Refer to the procedure report that was given to you for any specific questions about what was found during the examination.  If the procedure report does not answer your questions, please call your gastroenterologist to clarify.  If you requested that your care partner not be given the details of your procedure findings, then the procedure report has been included in a sealed envelope for you to review at your convenience later.  YOU SHOULD EXPECT: Some feelings of bloating in the abdomen. Passage of more gas than usual.  Walking can help get rid of the air that was put into your GI tract during the procedure and reduce the bloating. If you had a lower endoscopy (such as a colonoscopy or flexible sigmoidoscopy) you may notice spotting of blood in your stool or on the toilet paper. If you underwent a bowel prep for your procedure, you may not have a normal bowel movement for a few days.  Please Note:  You might notice some irritation and congestion in your nose or some drainage.  This is from the oxygen used during your procedure.  There is no need for concern and it should clear up in a day or so.  SYMPTOMS TO REPORT IMMEDIATELY:   Following lower endoscopy (colonoscopy or flexible sigmoidoscopy):  Excessive amounts of blood in the  stool  Significant tenderness or worsening of abdominal pains  Swelling of the abdomen that is new, acute  Fever of 100F or higher    For urgent or emergent issues, a gastroenterologist can be reached at any hour by calling [phone].   DIET:  We do recommend a small meal at first, but then you may proceed to your regular diet.  Drink plenty of fluids but you should avoid alcoholic beverages for 24 hours.  ACTIVITY:  You should plan to take it easy for the rest of today and you should NOT DRIVE or use heavy machinery until tomorrow (because of the sedation medicines used during the test).    FOLLOW UP: Our staff will call the number listed on your records the next business day following your procedure to check on you and address any questions or concerns that you may have regarding the information given to you following your procedure. If we do not reach you, we will leave a message.  However, if you are feeling well and you are not experiencing any problems, there is no need to return our call.  We will assume that you have returned to your regular daily activities without incident.  If any biopsies were taken you will be contacted by phone or by letter within the next 1-3 weeks.  Please call us at [phone] if you have not heard about the biopsies in 3 weeks.  SIGNATURES/CONFIDENTIALITY: You and/or your care partner have signed paperwork which will be entered into your electronic medical record.  These signatures attest to the fact that that the information above on your After Visit Summary has been reviewed and is understood.  Full responsibility of the confidentiality of this discharge information lies with you and/or your care-partner.

## 2017-10-15 NOTE — Op Note (Signed)
[facility] Patient Name: Ashley Franklin Procedure Date: 10/15/2017 4:06 PM MRN: [id number] Endoscopist: Iva Boop , MD Age: 47 Referring MD:  Date of Birth: 1970-10-30 Gender: Female Account #: [id number] Procedure:                Colonoscopy Indications:              Rectal bleeding, Change in bowel habits Medicines:                Propofol per Anesthesia, Monitored Anesthesia Care Procedure:                Pre-Anesthesia Assessment:                           - Prior to the procedure, a History and Physical                            was performed, and patient medications and                            allergies were reviewed. The patient's tolerance of                            previous anesthesia was also reviewed. The risks                            and benefits of the procedure and the sedation                            options and risks were discussed with the patient.                            All questions were answered, and informed consent                            was obtained. Prior Anticoagulants: The patient has                            taken no previous anticoagulant or antiplatelet                            agents. ASA Grade Assessment: II - A patient with                            mild systemic disease. After reviewing the risks                            and benefits, the patient was deemed in                            satisfactory condition to undergo the procedure.                           After obtaining informed consent, the colonoscope  was passed under direct vision. Throughout the                            procedure, the patient's blood pressure, pulse, and                            oxygen saturations were monitored continuously. The                            Colonoscope was introduced through the anus and                            advanced to the the cecum, identified by   appendiceal orifice and ileocecal valve. The                            colonoscopy was somewhat difficult due to a                            redundant colon. Successful completion of the                            procedure was aided by applying abdominal pressure.                            The patient tolerated the procedure well. The                            quality of the bowel preparation was fair. The                            ileocecal valve, appendiceal orifice, and rectum                            were photographed. The bowel preparation used was                            Miralax. Scope In: 4:22:06 PM Scope Out: 4:38:52 PM Scope Withdrawal Time: 0 hours 10 minutes 7 seconds  Total Procedure Duration: 0 hours 16 minutes 46 seconds  Findings:                 The perianal and digital rectal examinations were                            normal.                           The colon (entire examined portion) was moderately                            redundant. Advancing the scope required applying                            abdominal pressure.  The exam was otherwise without abnormality on                            direct and retroflexion views. Complications:            No immediate complications. Estimated Blood Loss:     Estimated blood loss: none. Impression:               - Preparation of the colon was fair.                           - Redundant colon.                           - The examination was otherwise normal on direct                            and retroflexion views.                           - No specimens collected. Recommendation:           - Patient has a contact number available for                            emergencies. The signs and symptoms of potential                            delayed complications were discussed with the                            patient. Return to normal activities tomorrow.                             Written discharge instructions were provided to the                            patient.                           - Resume previous diet.                           - Continue present medications.                           - Samples of Motegrity # 7 take 2 mg daily - see if                            that helps more than Linzess 290 ug                           - Repeat colonoscopy Age 47 for screening purposes.                            recall 10/2022 Iva Booparl E Gessner, MD 10/15/2017 4:49:30 PM This report has been signed electronically.

## 2017-10-15 NOTE — Progress Notes (Signed)
Pt's states no medical or surgical changes since previsit or office visit. 

## 2017-10-16 ENCOUNTER — Telehealth: Payer: Self-pay | Admitting: *Deleted

## 2017-10-16 ENCOUNTER — Ambulatory Visit: Payer: No Typology Code available for payment source | Attending: Family Medicine

## 2017-10-16 DIAGNOSIS — M6281 Muscle weakness (generalized): Secondary | ICD-10-CM | POA: Diagnosis present

## 2017-10-16 DIAGNOSIS — R262 Difficulty in walking, not elsewhere classified: Secondary | ICD-10-CM | POA: Diagnosis present

## 2017-10-16 DIAGNOSIS — M25672 Stiffness of left ankle, not elsewhere classified: Secondary | ICD-10-CM | POA: Diagnosis present

## 2017-10-16 DIAGNOSIS — M25572 Pain in left ankle and joints of left foot: Secondary | ICD-10-CM | POA: Insufficient documentation

## 2017-10-16 NOTE — Therapy (Addendum)
[facility] [address]  [place], [place], [address] Phone: [phone]   Fax:  [phone]  Physical Therapy Treatment  Patient Details  Name: Ashley Franklin MRN: [id number] Date of Birth: March 28, 1971 Referring Provider: Karlton Lemon, MD   Encounter Date: 10/16/2017  PT End of Session - 10/16/17 0806    Visit Number  8    Number of Visits  12    Date for PT Re-Evaluation  10/19/17    Authorization Type  [facility]    PT Start Time  0801    PT Stop Time  0855    PT Time Calculation (min)  54 min    Activity Tolerance  Patient tolerated treatment well    Behavior During Therapy  [facility] for tasks assessed/performed       Past Medical History:  Diagnosis Date  . Anemia   . Anxiety   . Bipolar affective disorder ([place])   . Blood transfusion without reported diagnosis   . Bronchospasm with bronchitis, acute   . Depression   . Kidney stone   . SBO (small bowel obstruction) ([place]) 01/2014    Past Surgical History:  Procedure Laterality Date  . CESAREAN SECTION    . ENDOMETRIAL ABLATION    . GASTRIC BYPASS  2001  . SBO  2015  . TUBAL LIGATION      There were no vitals filed for this visit.  Subjective Assessment - 10/16/17 0805    Subjective  Pt. doing well today with no new complaints.  Pt. to return to work on "modified duty" on Sunday 6.9.19.      How long can you stand comfortably?  2 hours    How long can you walk comfortably?  2 hours     Diagnostic tests  3/21 Effusion noted along with soft tissue swelling.  No cortical irregularities of distal fibula, fifth through third metatarsals.  No edema overlying the cortices of these bones either.  Peroneal tendons are intact without tenosynovitis.  Talar dome also intact without avulsion.    Patient Stated Goals  Reduce Pain; Increase Walking Threshold;    Currently in Pain?  No/denies    Pain Score  0-No pain    Multiple Pain Sites  No                        [facility] Adult PT Treatment/Exercise - 10/16/17 9678      Neuro Re-ed    Neuro Re-ed Details   B tandem, staggered stance on foam with eyes closed and head turns x 30 sec; intermittent UE support on counter       Vasopneumatic   Number Minutes Vasopneumatic   10 minutes    Vasopnuematic Location   Ankle    Vasopneumatic Pressure  High    Vasopneumatic Temperature   coldest temp.      Ankle Exercises: Machines for Strengthening   Cybex Leg Press  B straight leg, bent raise calf raise 20# x 15 x 15 reps each way      Ankle Exercises: Aerobic   Recumbent Bike  Lvl 2, 6 min      Ankle Exercises: Standing   Heel Walk (Round Trip)  x 60 ft     Toe Walk (Round Trip)  x 60 ft     Balance Beam  Balance beam with cone nock over/righting 2 x 5  reps each LE; some difficulty  Side Shuffle (Round Trip)  B side stepping with red TB at forefoot 2 x 25 ft     Other Standing Ankle Exercises  --      Ankle Exercises: Seated   Other Seated Ankle Exercises  Sit<>stand from mat table to airex pad x 10 reps                PT Short Term Goals - 09/14/17 8127      PT SHORT TERM GOAL #1   Title  Independent w/ inital HEP     Status  Achieved        PT Long Term Goals - 09/11/17 1126      PT LONG TERM GOAL #1   Title  Independent w/ ongoing HEP    Status  On-going      PT LONG TERM GOAL #2   Title  Pt. will demonstrate an increase in L ankle ROM to [facility] to allow for functional mobility     Status  On-going      PT LONG TERM GOAL #3   Title  Pt. will demonstrate increase in L ankle strength to >/= 4/5 to allow for increased ankle stability    Status  On-going      PT LONG TERM GOAL #4   Title  Pt. will report an increase in walking tolerance to >/= 30 minutes w/o pain     Status  On-going            Plan - 10/16/17 0808    Clinical Impression Statement  Spring doing well today.  With plans to return to "modified" work duty next week.   Feels she is progressing well with therapy.  Tolerated progression of proprioception and strengthening activates today well without increased pain.  Does require some cueing for general positioning with therex activities.  Progressing well toward goals.      PT Treatment/Interventions  ADLs/Self Care Home Management;Cryotherapy;Moist Heat;Iontophoresis '4mg'$ /ml Dexamethasone;Gait training;Therapeutic exercise;Ultrasound;Electrical Stimulation;Patient/family education;Neuromuscular re-education;Manual techniques;Dry needling;Passive range of motion;Vasopneumatic Device;Taping;Balance training;Therapeutic activities;Stair training    Consulted and Agree with Plan of Care  Patient       Patient will benefit from skilled therapeutic intervention in order to improve the following deficits and impairments:  Abnormal gait, Pain, Decreased activity tolerance, Decreased endurance, Decreased range of motion, Decreased strength, Increased edema, Impaired flexibility, Increased muscle spasms, Difficulty walking, Decreased balance, Impaired perceived functional ability  Visit Diagnosis: Pain in left ankle and joints of left foot  Stiffness of left ankle, not elsewhere classified  Difficulty in walking, not elsewhere classified  Muscle weakness (generalized)     Problem List Patient Active Problem List   Diagnosis Date Noted  . Left ankle injury, subsequent encounter 08/04/2017  . PCP NOTES >>>>>>>>>> 07/11/2017  . 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 Chronic knee pain 04/18/2015  . Plantar fasciitis 04/18/2015  . Tobacco use disorder 03/15/2015  . Morbid obesity ([place]) 01/05/2015  . Bipolar affective disorder [facility]) 01/02/2015   Bess Harvest, PTA 10/16/17 7:00 PM  [facility] [address]  [place], [place], [address] Phone: [phone]   Fax:  [phone]  Name: Ashley Franklin MRN: [id number] Date of Birth: [birth year]   PHYSICAL  THERAPY DISCHARGE SUMMARY  Visits from Start of Care: 8  Current functional level related to goals / functional outcomes:   Refer to above clinical impression for status as of last visit on 10/16/17. Pt failed to return for remaining visits  in [place], therefore will proceed with discharge from PT for this episode.  Unable to formally assess status at discharge due to failure to return.    Remaining deficits:   As above.   Education / Equipment:   HEP  Plan: Patient agrees to discharge.  Patient goals were not met. Patient is being discharged due to meeting the stated rehab goals.  ?????     Percival Spanish, PT, MPT 11/21/17, 9:57 AM  [facility] [address]  [place], [place], [address] Phone: [phone]   Fax:  [phone]

## 2017-10-16 NOTE — Telephone Encounter (Signed)
  Follow up Call-  Call back number 10/15/2017  Post procedure Call Back phone  # [phone]  Permission to leave phone message Yes  Some recent data might be hidden     Patient questions:  Do you have a fever, pain , or abdominal swelling? No. Pain Score  0 *  Have you tolerated food without any problems? Yes.    Have you been able to return to your normal activities? Yes.    Do you have any questions about your discharge instructions: Diet   No. Medications  No. Follow up visit  No.  Do you have questions or concerns about your Care? No.  Actions: * If pain score is 4 or above: No action needed, pain <4.

## 2017-10-19 ENCOUNTER — Ambulatory Visit: Payer: No Typology Code available for payment source | Admitting: Physical Therapy

## 2017-10-25 MED FILL — QUETIAPINE FUMARATE 200 MG: 200 | 90 days supply | Qty: 90 | Fill #0

## 2017-10-25 MED FILL — lamoTRIgine 25 MG TABS: 25 | 90 days supply | Qty: 90 | Fill #0

## 2017-10-25 MED FILL — LORazepam 1 MG TABS: 1 | 30 days supply | Qty: 90 | Fill #2

## 2017-10-25 MED FILL — FLUoxetine HCL 20 MG CAPS: 20 | 90 days supply | Qty: 360 | Fill #0

## 2017-10-25 MED FILL — SAXENDA 18 MG/3 ML PEN: 18 | 30 days supply | Qty: 15 | Fill #0

## 2017-10-29 ENCOUNTER — Ambulatory Visit: Payer: Self-pay | Admitting: Family Medicine

## 2017-10-30 ENCOUNTER — Ambulatory Visit: Payer: No Typology Code available for payment source | Admitting: Family Medicine

## 2017-10-30 ENCOUNTER — Encounter: Payer: Self-pay | Admitting: Family Medicine

## 2017-10-30 DIAGNOSIS — S99912D Unspecified injury of left ankle, subsequent encounter: Secondary | ICD-10-CM | POA: Diagnosis not present

## 2017-10-30 MED FILL — ZOLPIDEM TARTRATE 5 MG TAB: 5 | 30 days supply | Qty: 30 | Fill #0

## 2017-10-30 NOTE — Patient Instructions (Signed)
Continue with your return to work progression though this is not how I would prefer you return to your regular work. Let me know if they ask for any further documentation. Otherwise follow up with me as needed.

## 2017-10-31 ENCOUNTER — Encounter: Payer: Self-pay | Admitting: Family Medicine

## 2017-10-31 NOTE — Progress Notes (Signed)
PCP: Wanda Plump, MD Consultation requested by: Esperanza Richters PA-C  Subjective:   HPI: Patient is a 47 y.o. female here for left ankle injury.  3/21: Patient reports on 3/19 she accidentally hyper plantarflexed and inverted her left foot and ankle rushing to get out of her house. She states she can bear weight after this but has been limping ever since. She continues to have 5 out of 10 level sharp pain that shoots laterally into her foot. She states her third through fifth toes are numb. Denies prior foot or ankle injuries. She has been taking tramadol.  She did not take NSAIDs due to gastric bypass. No skin changes.  4/4: Patient reports she feels some improved compared to last visit. Has 3/10 level of pain, up to 7-8/10 and sharp. Has been wearing ASO. Pain radiating from dorsal foot to anterior ankle with bruising here. Rubber band sensation up foot. Has been icing, not taking anything for pain. Motion difficult. Pain worse with ambulation. No skin changes otherwise, no numbness.  5/3: Patient returns with only mild improvement compared to last visit. Using ASO. Pain level 4/10 anterior ankle, sharp, worse with bending foot up and walking. + swelling. Started physical therapy but only done 3 visits so far.   No skin changes, numbness.  6/18: Patient reports she's doing well. They have returned her to work but not on restrictions we wrote for - she is doing 4 hours of minimal weight bearing job at work for a few weeks then same job for 8 hours then they are likely going to return her to her job. She's done with PT but continues her home exercises. Not taking any medicines for this. Pain level 0/10. No skin changes.  Past Medical History:  Diagnosis Date  . Anemia   . Anxiety   . Bipolar affective disorder (HCC)   . Blood transfusion without reported diagnosis   . Bronchospasm with bronchitis, acute   . Depression   . Kidney stone   . SBO (small bowel  obstruction) (HCC) 01/2014    Current Outpatient Medications on File Prior to Visit  Medication Sig Dispense Refill  . divalproex (DEPAKOTE ER) 500 MG 24 hr tablet Take 3 tablets (1,500 mg total) by mouth at bedtime. 270 tablet 0  . FLUoxetine (PROZAC) 20 MG tablet Take 4 tablets (80 mg total) by mouth daily. 360 tablet 0  . lamoTRIgine (LAMICTAL) 25 MG tablet Take 1 tablet (25 mg total) by mouth daily. 90 tablet 0  . linaclotide (LINZESS) 290 MCG CAPS capsule Take 1 capsule (290 mcg total) by mouth daily before breakfast. 30 capsule 3  . LORazepam (ATIVAN) 1 MG tablet Take 1 tablet (1 mg total) by mouth 3 (three) times daily as needed for anxiety. 90 tablet 2  . Prucalopride Succinate (MOTEGRITY) 2 MG TABS Take 2 mg by mouth daily. Samples of this drug were given to the patient, Lot Number [id number] 7 tablet 0  . QUEtiapine (SEROQUEL) 100 MG tablet Take 1 tablet (100 mg total) by mouth 2 (two) times daily. 180 tablet 0  . QUEtiapine (SEROQUEL) 200 MG tablet Take 1 tablet (200 mg total) by mouth at bedtime. 90 tablet 0  . SAXENDA 18 MG/3ML SOPN   0  . traZODone (DESYREL) 50 MG tablet 25-50 mg at night as needed for sleep 30 tablet 2  . valACYclovir (VALTREX) 1000 MG tablet Take 1,000 mg by mouth as needed.     . varenicline (CHANTIX CONTINUING MONTH PAK) 1  MG tablet Take 1 tablet (1 mg total) by mouth 2 (two) times daily. 60 tablet 2  . varenicline (CHANTIX STARTING MONTH PAK) 0.5 MG X 11 & 1 MG X 42 tablet Take one 0.5 mg tablet by mouth once daily for 3 days, then increase to one 0.5 mg tablet twice daily for 4 days, then increase to one 1 mg tablet twice daily. 53 tablet 0  . zolpidem (AMBIEN) 5 MG tablet Take 1 tablet (5 mg total) by mouth at bedtime. 30 tablet 2   Current Facility-Administered Medications on File Prior to Visit  Medication Dose Route Frequency Provider Last Rate Last Dose  . 0.9 %  sodium chloride infusion  500 mL Intravenous Once Iva BoopGessner, Carl E, MD        Past Surgical  History:  Procedure Laterality Date  . CESAREAN SECTION    . ENDOMETRIAL ABLATION    . GASTRIC BYPASS  2001  . SBO  2015  . TUBAL LIGATION      Allergies  Allergen Reactions  . Nsaids Other (See Comments)    Contraindicated with gastric bypass    Social History   Socioeconomic History  . Marital status: Married    Spouse name: Not on file  . Number of children: 2  . Years of education: Not on file  . Highest education level: Not on file  Occupational History  . Occupation: phelbotomist    Employer: [employer]  Social Needs  . Financial resource strain: Not on file  . Food insecurity:    Worry: Not on file    Inability: Not on file  . Transportation needs:    Medical: Not on file    Non-medical: Not on file  Tobacco Use  . Smoking status: Former Smoker    Packs/day: 0.50    Years: 6.00    Pack years: 3.00    Last attempt to quit: 05/11/2014    Years since quitting: 3.4  . Smokeless tobacco: Never Used  . Tobacco comment: 1/2 ppd   Substance and Sexual Activity  . Alcohol use: No    Alcohol/week: 0.0 oz  . Drug use: No  . Sexual activity: Yes    Partners: Male    Birth control/protection: Surgical  Lifestyle  . Physical activity:    Days per week: Not on file    Minutes per session: Not on file  . Stress: Not on file  Relationships  . Social connections:    Talks on phone: Not on file    Gets together: Not on file    Attends religious service: Not on file    Active member of club or organization: Not on file    Attends meetings of clubs or organizations: Not on file    Relationship status: Not on file  . Intimate partner violence:    Fear of current or ex partner: Not on file    Emotionally abused: Not on file    Physically abused: Not on file    Forced sexual activity: Not on file  Other Topics Concern  . Not on file  Social History Narrative   The patient is a Water quality scientistphlebotomist at [employer]    1 son born 1988-08-13 daughter born in [id number] she is  married and lives with her husband   Prior smoker not current 3 caffeinated beverages a day no alcohol tobacco or drug use      2 independent Acupuncturistteachers    Pharmacy schools, [employer] teacher    Family  History  Problem Relation Age of Onset  . Diabetes Mother   . Cancer Mother 60       Throat   . Bipolar disorder Mother   . Bipolar disorder Sister   . Bipolar disorder Brother   . Drug abuse Brother   . Bipolar disorder Sister   . Colon cancer Neg Hx   . Cancer - Prostate Neg Hx   . Breast cancer Neg Hx     BP 120/84   Pulse 81   Ht 5\' 4"  (1.626 m)   Wt 220 lb (99.8 kg)   BMI 37.76 kg/m   Review of Systems: See HPI above.     Objective:  Physical Exam:  Gen: NAD, comfortable in exam room  Left foot/ankle: No gross deformity, swelling, ecchymoses FROM with 5/5 strength all directions Minimal TTP over ATFL.  No other tenderness. 1+ ant drawer and talar tilt.   Negative syndesmotic compression. Thompsons test negative. NV intact distally.  Assessment & Plan:  1.  Left ankle injury:  Radiographs and MRI reassuring.  Consistent with lateral ankle sprain of ATFL.  She will continue her home exercises.  Tylenol if needed for pain (hasn't been needing).  Unfortunately the return to work program is not the same as what I'd prescribed so she's not testing this at work as I would like her to.  Plan to follow up with me as needed.

## 2017-10-31 NOTE — Assessment & Plan Note (Signed)
Radiographs and MRI reassuring.  Consistent with lateral ankle sprain of ATFL.  She will continue her home exercises.  Tylenol if needed for pain (hasn't been needing).  Unfortunately the return to work program is not the same as what I'd prescribed so she's not testing this at work as I would like her to.  Plan to follow up with me as needed.

## 2017-11-05 ENCOUNTER — Encounter: Payer: Self-pay | Admitting: Family Medicine

## 2017-11-14 ENCOUNTER — Encounter (HOSPITAL_BASED_OUTPATIENT_CLINIC_OR_DEPARTMENT_OTHER): Payer: Self-pay

## 2017-11-14 ENCOUNTER — Other Ambulatory Visit: Payer: Self-pay

## 2017-11-14 ENCOUNTER — Emergency Department (HOSPITAL_BASED_OUTPATIENT_CLINIC_OR_DEPARTMENT_OTHER)
Admission: EM | Admit: 2017-11-14 | Discharge: 2017-11-14 | Disposition: A | Discharge status: Home / Self Care | Payer: No Typology Code available for payment source | Attending: Emergency Medicine | Admitting: Emergency Medicine

## 2017-11-14 ENCOUNTER — Ambulatory Visit: Payer: Self-pay | Admitting: Internal Medicine

## 2017-11-14 DIAGNOSIS — W25XXXA Contact with sharp glass, initial encounter: Secondary | ICD-10-CM | POA: Diagnosis not present

## 2017-11-14 DIAGNOSIS — Z87891 Personal history of nicotine dependence: Secondary | ICD-10-CM | POA: Insufficient documentation

## 2017-11-14 DIAGNOSIS — Y999 Unspecified external cause status: Secondary | ICD-10-CM | POA: Diagnosis not present

## 2017-11-14 DIAGNOSIS — Y929 Unspecified place or not applicable: Secondary | ICD-10-CM | POA: Diagnosis not present

## 2017-11-14 DIAGNOSIS — Y939 Activity, unspecified: Secondary | ICD-10-CM | POA: Insufficient documentation

## 2017-11-14 DIAGNOSIS — Z79899 Other long term (current) drug therapy: Secondary | ICD-10-CM | POA: Diagnosis not present

## 2017-11-14 DIAGNOSIS — S61212A Laceration without foreign body of right middle finger without damage to nail, initial encounter: Secondary | ICD-10-CM | POA: Insufficient documentation

## 2017-11-14 MED ORDER — TETANUS-DIPHTH-ACELL PERTUSSIS 5-2.5-18.5 LF-MCG/0.5 IM SUSP
INTRAMUSCULAR | Status: AC
  Filled 2017-11-14: qty 0.5

## 2017-11-14 MED ORDER — TETANUS-DIPHTH-ACELL PERTUSSIS 5-2.5-18.5 LF-MCG/0.5 IM SUSP
0.5000 mL | Freq: Once | INTRAMUSCULAR | Status: AC
  Administered 2017-11-14: 0.5 mL via INTRAMUSCULAR

## 2017-11-14 MED ORDER — ACETAMINOPHEN 500 MG PO TABS
1000.0000 mg | ORAL_TABLET | Freq: Once | ORAL | Status: AC
  Administered 2017-11-14: 1000 mg via ORAL
  Filled 2017-11-14: qty 2

## 2017-11-14 NOTE — Discharge Instructions (Addendum)
Keep clean. Apply thin layer of antibiotic ointment at least twice daily. Tylenol for pain.   Monitor for signs of infection. Return for fevers, chills, swelling, redness, warmth, pus

## 2017-11-14 NOTE — ED Notes (Addendum)
Pt states she needs to leave to pick up son , PA made aware, not bleeding noted to drg

## 2017-11-14 NOTE — Telephone Encounter (Signed)
Pt picked up an antique dish and noted a crack in the glass. Pt ran her left middle finger down the crack and it cut her open the full length of her middle finger. Pt stated it happed 45 minutes ago. Pt has tried direct pressure and liquid glue and wound is still bleeding.  [facility] Provider who can stitch is fully booked and [facility]. Care advice given. Reason for Disposition . [1] Bleeding AND [2] won't stop after 10 minutes of direct pressure (using correct technique)    Per PCP office Advised [facility]  Answer Assessment - Initial Assessment Questions 1. APPEARANCE of INJURY: "What does the injury look like?"     The middle finger is cut all the way down the length of the finger 2. SIZE: "How large is the cut?"      The length of the middle finger 3. BLEEDING: "Is it bleeding now?" If so, ask: "Is it difficult to stop?"      Yes- held pressure for 45 minutes 4. LOCATION: "Where is the injury located?"      Right middle finger 5. ONSET: "How long ago did the injury occur?"       45 minutes prior to call 6. MECHANISM: "Tell me how it happened."      Pt was reaching for an antique dish and she noticed a crack and she ran her middle finger along the crack and it cut her finger open. 7. TETANUS: "When was the last tetanus booster?"    2016  Protocols used: CUTS AND LACERATIONS-A-AH

## 2017-11-14 NOTE — ED Notes (Signed)
PA placed drsg to finger

## 2017-11-14 NOTE — ED Provider Notes (Signed)
[facility] EMERGENCY DEPARTMENT Provider Note   CSN: [id number] Arrival date & time: 11/14/17  1606     History   Chief Complaint Chief Complaint  Patient presents with  . Finger Injury    HPI Ashley Franklin is a 47 y.o. female here for evaluation of laceration to the right middle finger.  States that she ran her finger across a broken dish.  Laceration bled for an hour and did not stop so patient came to the ER for evaluation.  Reports tingling sensation to the tip of the finger.  Tetanus not current.  HPI  Past Medical History:  Diagnosis Date  . Anemia   . Anxiety   . Bipolar affective disorder (HCC)   . Blood transfusion without reported diagnosis   . Bronchospasm with bronchitis, acute   . Depression   . Kidney stone   . SBO (small bowel obstruction) (HCC) 01/2014    Patient Active Problem List   Diagnosis Date Noted  . Left ankle injury, subsequent encounter 08/04/2017  . PCP NOTES >>>>>>>>>> 07/11/2017  . 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 Chronic knee pain 04/18/2015  . Plantar fasciitis 04/18/2015  . Tobacco use disorder 03/15/2015  . Morbid obesity (HCC) 01/05/2015  . Bipolar affective disorder (HCC) 01/02/2015    Past Surgical History:  Procedure Laterality Date  . CESAREAN SECTION    . ENDOMETRIAL ABLATION    . GASTRIC BYPASS  2001  . SBO  2015  . TUBAL LIGATION       OB History   None      Home Medications    Prior to Admission medications   Medication Sig Start Date End Date Taking? Authorizing Provider  divalproex (DEPAKOTE ER) 500 MG 24 hr tablet Take 3 tablets (1,500 mg total) by mouth at bedtime. 10/01/17   [name], MD  FLUoxetine (PROZAC) 20 MG tablet Take 4 tablets (80 mg total) by mouth daily. 10/01/17   [name], MD  lamoTRIgine (LAMICTAL) 25 MG tablet Take 1 tablet (25 mg total) by mouth daily. 10/01/17   [name], MD  linaclotide Karlene Einstein) 290 MCG CAPS capsule Take 1 capsule (290 mcg total) by mouth daily  before breakfast. 08/29/17   Iva Boop, MD  LORazepam (ATIVAN) 1 MG tablet Take 1 tablet (1 mg total) by mouth 3 (three) times daily as needed for anxiety. 10/01/17   [name], MD  Prucalopride Succinate (MOTEGRITY) 2 MG TABS Take 2 mg by mouth daily. Samples of this drug were given to the patient, Lot Number [id number] 10/15/17   Iva Boop, MD  QUEtiapine (SEROQUEL) 100 MG tablet Take 1 tablet (100 mg total) by mouth 2 (two) times daily. 10/01/17 12/30/17  [name], MD  QUEtiapine (SEROQUEL) 200 MG tablet Take 1 tablet (200 mg total) by mouth at bedtime. 10/01/17 12/30/17  [name], MD  SAXENDA 18 MG/3ML SOPN  09/20/17   [provider]  traZODone (DESYREL) 50 MG tablet 25-50 mg at night as needed for sleep 07/02/17   [name], MD  valACYclovir (VALTREX) 1000 MG tablet Take 1,000 mg by mouth as needed.     [provider]  varenicline (CHANTIX CONTINUING MONTH PAK) 1 MG tablet Take 1 tablet (1 mg total) by mouth 2 (two) times daily. 07/10/17   Wanda Plump, MD  varenicline (CHANTIX STARTING MONTH PAK) 0.5 MG X 11 & 1 MG X 42 tablet Take one 0.5 mg tablet by mouth once daily for 3 days, then  increase to one 0.5 mg tablet twice daily for 4 days, then increase to one 1 mg tablet twice daily. 07/10/17   Wanda Plump, MD  zolpidem (AMBIEN) 5 MG tablet Take 1 tablet (5 mg total) by mouth at bedtime. 10/29/17   [name], MD    Family History Family History  Problem Relation Age of Onset  . Diabetes Mother   . Cancer Mother 60       Throat   . Bipolar disorder Mother   . Bipolar disorder Sister   . Bipolar disorder Brother   . Drug abuse Brother   . Bipolar disorder Sister   . Colon cancer Neg Hx   . Cancer - Prostate Neg Hx   . Breast cancer Neg Hx     Social History Social History   Tobacco Use  . Smoking status: Former Smoker    Packs/day: 0.50    Years: 6.00    Pack years: 3.00    Last attempt to quit: 05/11/2014    Years since quitting: 3.5  .  Smokeless tobacco: Never Used  . Tobacco comment: 1/2 ppd   Substance Use Topics  . Alcohol use: No    Alcohol/week: 0.0 oz  . Drug use: No     Allergies   Nsaids   Review of Systems Review of Systems  Skin: Positive for wound.  All other systems reviewed and are negative.    Physical Exam Updated Vital Signs BP 122/89 (BP Location: Left Arm)   Pulse 79   Temp 98.3 F (36.8 C) (Oral)   Resp 20   Ht 5\' 4"  (1.626 m)   Wt 102.5 kg (226 lb)   SpO2 98%   BMI 38.79 kg/m   Physical Exam  Constitutional: She is oriented to person, place, and time. She appears well-developed and well-nourished.  Non-toxic appearance.  HENT:  Head: Normocephalic.  Right Ear: External ear normal.  Left Ear: External ear normal.  Nose: Nose normal.  Eyes: Conjunctivae and EOM are normal.  Neck: Full passive range of motion without pain.  Cardiovascular: Normal rate.  Good cap refill to tip of right middle finger  Pulmonary/Chest: Effort normal. No tachypnea. No respiratory distress.  Musculoskeletal: Normal range of motion.  Full AROM of right digits   Neurological: She is alert and oriented to person, place, and time.  Sensation to light touch intact in right middle finger tip   Skin: Skin is warm and dry. Capillary refill takes less than 2 seconds.  Have centimeter straight laceration to the tip of the right middle finger, hemostatic.  Not involving the nail.  Psychiatric: Her behavior is normal. Thought content normal.     ED Treatments / Results  Labs (all labs ordered are listed, but only abnormal results are displayed) Labs Reviewed - No data to display  EKG None  Radiology No results found.  Procedures Procedures (including critical care time)  Medications Ordered in ED Medications  Tdap (BOOSTRIX) 5-2.5-18.5 LF-MCG/0.5 injection (has no administration in time range)  Tdap (BOOSTRIX) injection 0.5 mL (0.5 mLs Intramuscular Given 11/14/17 1626)  acetaminophen (TYLENOL)  tablet 1,000 mg (1,000 mg Oral Given 11/14/17 1724)     Initial Impression / Assessment and Plan / ED Course  I have reviewed the triage vital signs and the nursing notes.  Pertinent labs & imaging results that were available during my care of the patient were reviewed by me and considered in my medical decision making (see chart for details).  47 year old female here with laceration to the right middle finger.  Tdap updated today.  Wound cleansed by RN.  Laceration is less than half a centimeter, hemostatic and not deep.  I do not think we need emergent imaging.  Bleeding has been controlled.  No tendon or nerve injury noted.  Full range of motion of the affected extremity.  No indication for suture repair.  Discussed return precautions.   Final Clinical Impressions(s) / ED Diagnoses   Final diagnoses:  Laceration of right middle finger without foreign body without damage to nail, initial encounter    ED Discharge Orders    None       Liberty HandyGibbons, Edwardo Wojnarowski J, PA-C 11/14/17 1738    Loren RacerYelverton, David, MD 11/15/17 [phone]

## 2017-11-14 NOTE — ED Triage Notes (Signed)
Pt cut right middle finger tip on glass approx 1 hour PTA-bleeding continues-gauze/kling wrap applied in triage

## 2017-11-19 ENCOUNTER — Telehealth: Payer: Self-pay

## 2017-11-19 NOTE — Telephone Encounter (Signed)
Author phoned pt. re: reported night sweats, SOB, non-productive cough that pt. states she has recently developed. Pt. states she has had the feeling she may throw up X 1 mo. As well. Pt. does not notice what may be triggering it, but questions if it is hormone-related, anxiety-related, heat-related, etc. Prior to phone call with pt, author phoned [place] [employer], as pt. is a Producer, [employer] employee, to receive most recent PPD results.Last quantiferon gold was completed in 2017 and it was negative.  Findings routed to Dr. Drue NovelPaz in preparation for 7/10 appointment pt. Self-scheduled with him.

## 2017-11-19 NOTE — Telephone Encounter (Signed)
Thank you :)

## 2017-11-21 ENCOUNTER — Encounter: Payer: Self-pay | Admitting: Internal Medicine

## 2017-11-21 ENCOUNTER — Ambulatory Visit (INDEPENDENT_AMBULATORY_CARE_PROVIDER_SITE_OTHER): Payer: No Typology Code available for payment source | Admitting: Internal Medicine

## 2017-11-21 ENCOUNTER — Ambulatory Visit (HOSPITAL_BASED_OUTPATIENT_CLINIC_OR_DEPARTMENT_OTHER)
Admission: RE | Admit: 2017-11-21 | Discharge: 2017-11-21 | Disposition: A | Discharge status: Home / Self Care | Payer: No Typology Code available for payment source | Source: Ambulatory Visit | Attending: Internal Medicine | Admitting: Internal Medicine

## 2017-11-21 VITALS — BP 132/66 | HR 66 | Temp 98.1°F | Resp 16 | Ht 64.0 in | Wt 224.0 lb

## 2017-11-21 DIAGNOSIS — R918 Other nonspecific abnormal finding of lung field: Secondary | ICD-10-CM | POA: Diagnosis not present

## 2017-11-21 DIAGNOSIS — R5383 Other fatigue: Secondary | ICD-10-CM | POA: Diagnosis not present

## 2017-11-21 DIAGNOSIS — R059 Cough, unspecified: Secondary | ICD-10-CM

## 2017-11-21 DIAGNOSIS — F3113 Bipolar disorder, current episode manic without psychotic features, severe: Secondary | ICD-10-CM | POA: Diagnosis not present

## 2017-11-21 DIAGNOSIS — R61 Generalized hyperhidrosis: Secondary | ICD-10-CM | POA: Diagnosis not present

## 2017-11-21 DIAGNOSIS — R05 Cough: Secondary | ICD-10-CM | POA: Insufficient documentation

## 2017-11-21 DIAGNOSIS — R9389 Abnormal findings on diagnostic imaging of other specified body structures: Secondary | ICD-10-CM | POA: Diagnosis not present

## 2017-11-21 MED ORDER — DOXYCYCLINE HYCLATE 100 MG PO TABS: 100.0000 mg | ORAL_TABLET | Freq: Two times a day (BID) | ORAL | 0 refills | Status: DC

## 2017-11-21 MED ORDER — BUDESONIDE-FORMOTEROL FUMARATE 160-4.5 MCG/ACT IN AERO: 2.0000 | INHALATION_SPRAY | Freq: Two times a day (BID) | RESPIRATORY_TRACT | 3 refills | Status: DC

## 2017-11-21 MED FILL — DOXYCYCLINE HYCLATE 100 MG: 100 | 8 days supply | Qty: 15 | Fill #0

## 2017-11-21 MED FILL — SYMBICORT 160-4.5 MCG INH: 160-4.5 | 30 days supply | Qty: 10 | Fill #0

## 2017-11-21 NOTE — Progress Notes (Addendum)
Subjective:    Patient ID: Ashley Franklin, female    DOB: 10-24-1970, 47 y.o.   MRN: [id number]  DOS:  11/21/2017 Type of visit - description : Acute visit, multiple symptoms Interval history: Not feeling well for the last 3 to 4 weeks, described the following symptoms: Decreased appetite Night sweats, "soak my pajamas every night , shiver" Lack of energy Cough with greenish sputum and a metallic taste.  Some shortness of breath, occasionally DOE. Also for the last 2 weeks have few episodes of vomiting with very little warning but otherwise  No GI sxs. New medicines: Started Linzess about a month ago and that has helped with constipation. She is also check taking Chantix, on her third month, did quit tobacco     Review of Systems Denies fever. The patient had a endometrial ablation, no vaginal bleeding in about 6 years.  No recent  Vag discharge. Reports mild weight loss. No rash or tick bites No history of asthma but again she has noted some wheezing. No headaches She has bipolar, reports she is under some stress work-related, states her mood is "all over the place"  Past Medical History:  Diagnosis Date  . Anemia   . Anxiety   . Bipolar affective disorder (HCC)   . Blood transfusion without reported diagnosis   . Bronchospasm with bronchitis, acute   . Depression   . Kidney stone   . SBO (small bowel obstruction) (HCC) 01/2014    Past Surgical History:  Procedure Laterality Date  . CESAREAN SECTION    . ENDOMETRIAL ABLATION    . GASTRIC BYPASS  2001  . SBO  2015  . TUBAL LIGATION      Social History   Socioeconomic History  . Marital status: Married    Spouse name: Not on file  . Number of children: 2  . Years of education: Not on file  . Highest education level: Not on file  Occupational History  . Occupation: phelbotomist    Employer: [employer]  Social Needs  . Financial resource strain: Not on file  . Food insecurity:    Worry: Not on file   Inability: Not on file  . Transportation needs:    Medical: Not on file    Non-medical: Not on file  Tobacco Use  . Smoking status: Former Smoker    Packs/day: 0.50    Years: 6.00    Pack years: 3.00    Last attempt to quit: 05/11/2014    Years since quitting: 3.5  . Smokeless tobacco: Never Used  . Tobacco comment: 1/2 ppd   Substance and Sexual Activity  . Alcohol use: No    Alcohol/week: 0.0 oz  . Drug use: No  . Sexual activity: Yes    Partners: Male    Birth control/protection: Surgical  Lifestyle  . Physical activity:    Days per week: Not on file    Minutes per session: Not on file  . Stress: Not on file  Relationships  . Social connections:    Talks on phone: Not on file    Gets together: Not on file    Attends religious service: Not on file    Active member of club or organization: Not on file    Attends meetings of clubs or organizations: Not on file    Relationship status: Not on file  . Intimate partner violence:    Fear of current or ex partner: Not on file    Emotionally abused: Not  on file    Physically abused: Not on file    Forced sexual activity: Not on file  Other Topics Concern  . 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 Always use your most recent med list.          budesonide-formoterol 160-4.5 MCG/ACT inhaler Commonly known as:  SYMBICORT Inhale 2 puffs into the lungs 2 (two) times daily.   divalproex 500 MG 24 hr tablet Commonly known as:  DEPAKOTE ER Take 3 tablets  (1,500 mg total) by mouth at bedtime.   doxycycline 100 MG tablet Commonly known as:  VIBRA-TABS Take 1 tablet (100 mg total) by mouth 2 (two) times daily.   FLUoxetine 20 MG tablet Commonly known as:  PROZAC Take 4 tablets (80 mg total) by mouth daily.   lamoTRIgine 25 MG tablet Commonly known as:  LAMICTAL Take 1 tablet (25 mg total) by mouth daily.   linaclotide 290 MCG Caps capsule Commonly known as:  LINZESS Take 1 capsule (290 mcg total) by mouth daily before breakfast.   LORazepam 1 MG tablet Commonly known as:  ATIVAN Take 1 tablet (1 mg total) by mouth 3 (three) times daily as needed for anxiety.   Prucalopride Succinate 2 MG Tabs Commonly known as:  MOTEGRITY Take 2 mg by mouth daily. Samples of this drug were given to the patient, Lot Number [id number]   QUEtiapine 200 MG tablet Commonly known as:  SEROQUEL Take 1 tablet (200 mg total) by mouth at bedtime.   QUEtiapine 100 MG tablet Commonly known as:  SEROQUEL Take 1 tablet (100 mg total) by mouth 2 (two) times daily.   SAXENDA 18 MG/3ML Sopn Generic drug:  Liraglutide -Weight Management   traZODone 50 MG tablet Commonly known as:  DESYREL 25-50 mg at night as needed for sleep   valACYclovir 1000 MG tablet Commonly known as:  VALTREX Take 1,000 mg by mouth as needed.   varenicline 1 MG tablet Commonly known as:  CHANTIX CONTINUING MONTH PAK Take 1 tablet (1 mg total) by mouth 2 (two) times daily.   zolpidem 5 MG tablet Commonly known as:  AMBIEN Take 1 tablet (5 mg total) by mouth at bedtime.          Objective:   Physical Exam BP 132/66 (BP Location: Left Arm, Patient Position: Sitting, Cuff Size: Normal)   Pulse 66   Temp 98.1 F (36.7 C) (Oral)   Resp 16   Ht 5\' 4"  (1.626 m)   Wt 224 lb (101.6 kg)   SpO2 98%   BMI 38.45 kg/m   General:   Well developed, NAD, see BMI.  HEENT:  Normocephalic . Face symmetric, atraumatic Neck: No thyromegaly Lungs:  Few bilateral wheezes, few  rhonchi.  No crackles. Normal respiratory effort, no intercostal retractions, no accessory muscle use. Heart: RRR,  no murmur.  no pretibial edema bilaterally  Abdomen:  Not distended, soft, non-tender. No rebound or rigidity.   Skin: Not pale. Not jaundice Neurologic:  alert & oriented X3.  Speech normal, gait appropriate for age and unassisted Psych--  Cognition and judgment appear intact.  Cooperative with normal attention span and concentration.  Behavior appropriate. No anxious or depressed appearing.     Assessment & Plan:   Assesment Bipolar, anxiety: per psych Dr Vanetta Shawl H/o kidney stones H/o SBO ~ 2015  Plan: Cough, shortness of breath, night sweats, episodic nausea, fatigue: The patient presents with multiple symptoms, physical exam is okay except for some wheezing.  She has no history of asthma, she quit tobacco 4 months ago. To investigate and treat her symptoms further will do the following: Chest x-ray, CBC, CMP. Start Symbicort, doxycycline (hold until UPT is back). Menopausal?  Check FSH, LH.  For completeness we will do a UPT Bipolar: Sees psychiatry, check a valproic acid level r/o overtreatment nausea: As described above, if symptoms persist, rec to consult GI Constipation: Better, saw GI, on linzess, does not plan to start Motegrity RTC 1 month

## 2017-11-21 NOTE — Patient Instructions (Signed)
GO TO THE LAB : Get the blood work     GO TO THE FRONT DESK Schedule your next appointment for a follow-up in 1 month     STOP BY THE FIRST FLOOR:  get the XR   For cough: Symbicort 2 puffs twice a day Start doxycycline Mucinex DM as needed

## 2017-11-21 NOTE — Progress Notes (Signed)
Pre visit review using our clinic review tool, if applicable. No additional management support is needed unless otherwise documented below in the visit note. 

## 2017-11-22 ENCOUNTER — Ambulatory Visit: Payer: No Typology Code available for payment source | Admitting: Family Medicine

## 2017-11-22 ENCOUNTER — Encounter: Payer: Self-pay | Admitting: Family Medicine

## 2017-11-22 DIAGNOSIS — S99912D Unspecified injury of left ankle, subsequent encounter: Secondary | ICD-10-CM

## 2017-11-22 LAB — COMPREHENSIVE METABOLIC PANEL
ALBUMIN: 3.6 g/dL (ref 3.5–5.2)
ALT: 13 U/L (ref 0–35)
AST: 18 U/L (ref 0–37)
Alkaline Phosphatase: 54 U/L (ref 39–117)
BILIRUBIN TOTAL: 0.3 mg/dL (ref 0.2–1.2)
BUN: 9 mg/dL (ref 6–23)
CALCIUM: 9.4 mg/dL (ref 8.4–10.5)
CHLORIDE: 107 meq/L (ref 96–112)
CO2: 28 meq/L (ref 19–32)
Creatinine, Ser: 0.85 mg/dL (ref 0.40–1.20)
GFR: 76.85 mL/min (ref >60.00)
Glucose, Bld: 79 mg/dL (ref 70–99)
Potassium: 5.2 mEq/L — ABNORMAL HIGH (ref 3.5–5.1)
SODIUM: 144 meq/L (ref 135–145)
Total Protein: 6 g/dL (ref 6.0–8.3)

## 2017-11-22 LAB — FOLLICLE STIMULATING HORMONE: FSH: 101.8 m[IU]/mL

## 2017-11-22 LAB — CBC WITH DIFFERENTIAL/PLATELET
BASOS ABS: 0 10*3/uL (ref 0.0–0.1)
Basophils Relative: 0.7 % (ref 0.0–3.0)
Eosinophils Absolute: 1 10*3/uL — ABNORMAL HIGH (ref 0.0–0.7)
Eosinophils Relative: 14.5 % — ABNORMAL HIGH (ref 0.0–5.0)
HCT: 42.1 % (ref 36.0–46.0)
Hemoglobin: 14.1 g/dL (ref 12.0–15.0)
LYMPHS ABS: 2.7 10*3/uL (ref 0.7–4.0)
Lymphocytes Relative: 39.3 % (ref 12.0–46.0)
MCHC: 33.6 g/dL (ref 30.0–36.0)
MCV: 93.6 fl (ref 78.0–100.0)
MONOS PCT: 7.7 % (ref 3.0–12.0)
Monocytes Absolute: 0.5 10*3/uL (ref 0.1–1.0)
NEUTROS ABS: 2.6 10*3/uL (ref 1.4–7.7)
NEUTROS PCT: 37.8 % — AB (ref 43.0–77.0)
Platelets: 305 10*3/uL (ref 150.0–400.0)
RBC: 4.5 Mil/uL (ref 3.87–5.11)
RDW: 13.7 % (ref 11.5–15.5)
WBC: 6.9 10*3/uL (ref 4.0–10.5)

## 2017-11-22 LAB — VALPROIC ACID LEVEL

## 2017-11-22 LAB — LUTEINIZING HORMONE: LH: 48.88 m[IU]/mL

## 2017-11-22 LAB — PREGNANCY, URINE: PREG TEST UR: NEGATIVE

## 2017-11-22 NOTE — Patient Instructions (Signed)
Come back later with your shoes to have us place metatarsal pads in your shoes (or in inserts) - bring your custom orthotics too if you can. Your problem now is due to metatarsalgia. Your ankle sprain has healed though. Tylenol if needed for pain.

## 2017-11-23 NOTE — Assessment & Plan Note (Signed)
Cough, shortness of breath, night sweats, episodic nausea, fatigue: The patient presents with multiple symptoms, physical exam is okay except for some wheezing.  She has no history of asthma, she quit tobacco 4 months ago. To investigate and treat her symptoms further will do the following: Chest x-ray, CBC, CMP. Start Symbicort, doxycycline (hold until UPT is back). Menopausal?  Check FSH, LH.  For completeness we will do a UPT Bipolar: Sees psychiatry, check a valproic acid level r/o overtreatment nausea: As described above, if symptoms persist, rec to consult GI Constipation: Better, saw GI, on linzess, does not plan to start Motegrity RTC 1 month

## 2017-11-25 ENCOUNTER — Encounter: Payer: Self-pay | Admitting: Family Medicine

## 2017-11-25 NOTE — Assessment & Plan Note (Signed)
Radiographs and MRI were reassuring.  Clinically ATFL has healed and pain now due to metatarsalgia.  Stressed importance of arch supports with metatarsal pad - advised to return with her shoes to have us place these to take pressure off mid-metatarsal heads.  Tylenol if needed, icing if needed.  No restrictions.

## 2017-11-25 NOTE — Progress Notes (Signed)
PCP: Wanda Plump, MD Consultation requested by: Esperanza Richters PA-C  Subjective:   HPI: Patient is a 47 y.o. female here for left ankle injury.  3/21: Patient reports on 3/19 she accidentally hyper plantarflexed and inverted her left foot and ankle rushing to get out of her house. She states she can bear weight after this but has been limping ever since. She continues to have 5 out of 10 level sharp pain that shoots laterally into her foot. She states her third through fifth toes are numb. Denies prior foot or ankle injuries. She has been taking tramadol.  She did not take NSAIDs due to gastric bypass. No skin changes.  4/4: Patient reports she feels some improved compared to last visit. Has 3/10 level of pain, up to 7-8/10 and sharp. Has been wearing ASO. Pain radiating from dorsal foot to anterior ankle with bruising here. Rubber band sensation up foot. Has been icing, not taking anything for pain. Motion difficult. Pain worse with ambulation. No skin changes otherwise, no numbness.  5/3: Patient returns with only mild improvement compared to last visit. Using ASO. Pain level 4/10 anterior ankle, sharp, worse with bending foot up and walking. + swelling. Started physical therapy but only done 3 visits so far.   No skin changes, numbness.  6/18: Patient reports she's doing well. They have returned her to work but not on restrictions we wrote for - she is doing 4 hours of minimal weight bearing job at work for a few weeks then same job for 8 hours then they are likely going to return her to her job. She's done with PT but continues her home exercises. Not taking any medicines for this. Pain level 0/10. No skin changes.  7/11: Patient returns stating she's had increased pain in left forefoot. Feels like pain is back to how it was first visit. Pain is sharp dorsal distal foot radiating up to ankle. Worse with ankle brace. Pain level 3-4/10, sharp and constant. No skin  changes.  Past Medical History:  Diagnosis Date  . Anemia   . Anxiety   . Bipolar affective disorder (HCC)   . Blood transfusion without reported diagnosis   . Bronchospasm with bronchitis, acute   . Depression   . Kidney stone   . SBO (small bowel obstruction) (HCC) 01/2014    Current Outpatient Medications on File Prior to Visit  Medication Sig Dispense Refill  . budesonide-formoterol (SYMBICORT) 160-4.5 MCG/ACT inhaler Inhale 2 puffs into the lungs 2 (two) times daily. 1 Inhaler 3  . divalproex (DEPAKOTE ER) 500 MG 24 hr tablet Take 3 tablets (1,500 mg total) by mouth at bedtime. 270 tablet 0  . doxycycline (VIBRA-TABS) 100 MG tablet Take 1 tablet (100 mg total) by mouth 2 (two) times daily. 15 tablet 0  . FLUoxetine (PROZAC) 20 MG tablet Take 4 tablets (80 mg total) by mouth daily. 360 tablet 0  . lamoTRIgine (LAMICTAL) 25 MG tablet Take 1 tablet (25 mg total) by mouth daily. 90 tablet 0  . linaclotide (LINZESS) 290 MCG CAPS capsule Take 1 capsule (290 mcg total) by mouth daily before breakfast. 30 capsule 3  . LORazepam (ATIVAN) 1 MG tablet Take 1 tablet (1 mg total) by mouth 3 (three) times daily as needed for anxiety. 90 tablet 2  . Prucalopride Succinate (MOTEGRITY) 2 MG TABS Take 2 mg by mouth daily. Samples of this drug were given to the patient, Lot Number [id number] (Patient not taking: Reported on 11/21/2017) 7 tablet 0  .  QUEtiapine (SEROQUEL) 100 MG tablet Take 1 tablet (100 mg total) by mouth 2 (two) times daily. 180 tablet 0  . QUEtiapine (SEROQUEL) 200 MG tablet Take 1 tablet (200 mg total) by mouth at bedtime. 90 tablet 0  . SAXENDA 18 MG/3ML SOPN   0  . traZODone (DESYREL) 50 MG tablet 25-50 mg at night as needed for sleep 30 tablet 2  . valACYclovir (VALTREX) 1000 MG tablet Take 1,000 mg by mouth as needed.     . varenicline (CHANTIX CONTINUING MONTH PAK) 1 MG tablet Take 1 tablet (1 mg total) by mouth 2 (two) times daily. 60 tablet 2  . zolpidem (AMBIEN) 5 MG tablet  Take 1 tablet (5 mg total) by mouth at bedtime. 30 tablet 2   Current Facility-Administered Medications on File Prior to Visit  Medication Dose Route Frequency Provider Last Rate Last Dose  . 0.9 %  sodium chloride infusion  500 mL Intravenous Once Iva BoopGessner, Carl E, MD        Past Surgical History:  Procedure Laterality Date  . CESAREAN SECTION    . ENDOMETRIAL ABLATION    . GASTRIC BYPASS  2001  . SBO  2015  . TUBAL LIGATION      Allergies  Allergen Reactions  . Nsaids Other (See Comments)    Contraindicated with gastric bypass    Social History   Socioeconomic History  . Marital status: Married    Spouse name: Not on file  . Number of children: 2  . Years of education: Not on file  . Highest education level: Not on file  Occupational History  . Occupation: phelbotomist    Employer: [employer]  Social Needs  . Financial resource strain: Not on file  . Food insecurity:    Worry: Not on file    Inability: Not on file  . Transportation needs:    Medical: Not on file    Non-medical: Not on file  Tobacco Use  . Smoking status: Former Smoker    Packs/day: 0.50    Years: 6.00    Pack years: 3.00    Last attempt to quit: 05/11/2014    Years since quitting: 3.5  . Smokeless tobacco: Never Used  . Tobacco comment: 1/2 ppd   Substance and Sexual Activity  . Alcohol use: No    Alcohol/week: 0.0 oz  . Drug use: No  . Sexual activity: Yes    Partners: Male    Birth control/protection: Surgical  Lifestyle  . Physical activity:    Days per week: Not on file    Minutes per session: Not on file  . Stress: Not on file  Relationships  . Social connections:    Talks on phone: Not on file    Gets together: Not on file    Attends religious service: Not on file    Active member of club or organization: Not on file    Attends meetings of clubs or organizations: Not on file    Relationship status: Not on file  . Intimate partner violence:    Fear of current or ex partner:  Not on file    Emotionally abused: Not on file    Physically abused: Not on file    Forced sexual activity: Not on file  Other Topics Concern  . Not on file  Social History Narrative   The patient is a Water quality scientistphlebotomist at [employer]    1 son born 1988-08-13 daughter born in [id number] she is married and lives  with her husband   Prior smoker not current 3 caffeinated beverages a day no alcohol tobacco or drug use      2 independent teachers    Pharmacy schools, music teacher    Family History  Problem Relation Age of Onset  . Diabetes Mother   . Cancer Mother 60       Throat   . Bipolar disorder Mother   . Bipolar disorder Sister   . Bipolar disorder Brother   . Drug abuse Brother   . Bipolar disorder Sister   . Colon cancer Neg Hx   . Cancer - Prostate Neg Hx   . Breast cancer Neg Hx     BP 113/78   Pulse 80   Ht 5\' 4"  (1.626 m)   Wt 220 lb (99.8 kg)   BMI 37.76 kg/m   Review of Systems: See HPI above.     Objective:  Physical Exam:  Gen: NAD, comfortable in exam room  Left foot/ankle: No gross deformity, swelling, ecchymoses FROM TTP 2nd-4th metatarsals especially distally.  No other tenderness. Negative ant drawer and talar tilt.   Negative syndesmotic compression. Mild pain metatarsal squeeze. Thompsons test negative. NV intact distally.  Assessment & Plan:  1.  Left ankle injury:  Radiographs and MRI were reassuring.  Clinically ATFL has healed and pain now due to metatarsalgia.  Stressed importance of arch supports with metatarsal pad - advised to return with her shoes to have [place] place these to take pressure off mid-metatarsal heads.  Tylenol if needed, icing if needed.  No restrictions.

## 2017-11-26 NOTE — Addendum Note (Signed)
Addended byConrad Ann[place]: Laine Fonner D on: 11/26/2017 08:00 AM   Modules accepted: Orders

## 2017-11-28 MED FILL — ZOLPIDEM TARTRATE 5 MG TAB: 5 | 30 days supply | Qty: 30 | Fill #1

## 2017-11-28 MED FILL — LORazepam 1 MG TABS: 1 | 30 days supply | Qty: 90 | Fill #0

## 2017-11-29 ENCOUNTER — Encounter: Payer: Self-pay | Admitting: Internal Medicine

## 2017-12-17 NOTE — Progress Notes (Signed)
BH MD/PA/NP OP Progress Note  12/31/2017 10:58 AM BAKER KOGLER  MRN:  [id number]  Chief Complaint:  Chief Complaint    Other; Follow-up     HPI:  Patient presents for follow-up appointment for bipolar disorder.  She states that she has not been doing well.  She was recently diagnosed with shingles on her left side of her body. Although she denies any rash, she has neuropathic pain. She wants to change her lorazepam to valium given significant anxiety due to pain. She also wants to uptitrate Ambien for insomnia due to pain. She feels that she has been feeling a little more down even before her pain. She has a son, age 46 who lives in [place]. His girlfriend of four years left him. She does not know how to help him and feels responsible, although she is aware that he is adult. She is concerned that she may have "melt down anxiety attacks." She has insomnia. She feels depressed and anxious. She has fair concentration. She denies SI.  She denies decreased need for sleep, euphoria or increased goal-directed activity.   Wt Readings from Last 3 Encounters:  12/31/17 227 lb (103 kg)  12/28/17 227 lb (103 kg)  11/22/17 220 lb (99.8 kg)  weight 232 lb  In May  Per PMP,  Zolpidem, ativan filled on 12/27/2017   Visit Diagnosis:    ICD-10-CM   1. Bipolar affective disorder, currently depressed, mild (HCC) F31.31 FLUoxetine (PROZAC) 20 MG tablet    QUEtiapine (SEROQUEL) 200 MG tablet    zolpidem (AMBIEN) 5 MG tablet    Valproic acid level    Comprehensive Metabolic Panel (CMET)    CBC    Past Psychiatric History: Please see initial evaluation for full details. I have reviewed the history. No updates at this time.     Past Medical History:  Past Medical History:  Diagnosis Date  . Anemia   . Anxiety   . Bipolar affective disorder (HCC)   . Blood transfusion without reported diagnosis   . Bronchospasm with bronchitis, acute   . Depression   . Kidney stone   . SBO (small bowel obstruction)  (HCC) 01/2014    Past Surgical History:  Procedure Laterality Date  . CESAREAN SECTION    . ENDOMETRIAL ABLATION    . GASTRIC BYPASS  2001  . SBO  2015  . TUBAL LIGATION      Family Psychiatric History: Please see initial evaluation for full details. I have reviewed the history. No updates at this time.     Family History:  Family History  Problem Relation Age of Onset  . Diabetes Mother   . Cancer Mother 60       Throat   . Bipolar disorder Mother   . Bipolar disorder Sister   . Bipolar disorder Brother   . Drug abuse Brother   . Bipolar disorder Sister   . Colon cancer Neg Hx   . Cancer - Prostate Neg Hx   . Breast cancer Neg Hx     Social History:  Social History   Socioeconomic History  . Marital status: Married    Spouse name: Not on file  . Number of children: 2  . Years of education: Not on file  . Highest education level: Not on file  Occupational History  . Occupation: phelbotomist    Employer: [employer]  Social Needs  . Financial resource strain: Not on file  . Food insecurity:    Worry: Not on  file    Inability: Not on file  . Transportation needs:    Medical: Not on file    Non-medical: Not on file  Tobacco Use  . Smoking status: Former Smoker    Packs/day: 0.50    Years: 6.00    Pack years: 3.00    Last attempt to quit: 05/11/2014    Years since quitting: 3.6  . Smokeless tobacco: Never Used  . Tobacco comment: 1/2 ppd   Substance and Sexual Activity  . Alcohol use: No    Alcohol/week: 0.0 standard drinks  . Drug use: No  . Sexual activity: Yes    Partners: Male    Birth control/protection: Surgical  Lifestyle  . Physical activity:    Days per week: Not on file    Minutes per session: Not on file  . Stress: Not on file  Relationships  . Social connections:    Talks on phone: Not on file    Gets together: Not on file    Attends religious service: Not on file    Active member of club or organization: Not on file    Attends  meetings of clubs or organizations: Not on file    Relationship status: Not on file  Other Topics Concern  . 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 Nsaids Other (See Comments)    Contraindicated with gastric bypass    Metabolic Disorder Labs: Lab Results  Component Value Date   HGBA1C 4.9 02/06/2017   MPG 105 02/26/2015   No results found for: PROLACTIN Lab Results  Component Value Date   CHOL 165 02/06/2017   TRIG 87 02/06/2017   HDL 54 02/06/2017   LDLCALC 94 02/06/2017   Lab Results  Component Value Date   TSH 1.12 07/10/2017   TSH 1.22 06/08/2016    Therapeutic Level Labs: No results found for: LITHIUM Lab Results  Component Value Date   VALPROATE <12.5 (L) 11/21/2017   VALPROATE 43 (L) 02/06/2017   No components found for:  CBMZ  Current Medications: Current Outpatient Medications  Medication Sig Dispense Refill  . budesonide-formoterol (SYMBICORT) 160-4.5 MCG/ACT inhaler Inhale 2 puffs into the lungs 2 (two) times daily. 1 Inhaler 3  . divalproex (DEPAKOTE ER) 500 MG 24 hr tablet Take 4 tablets (2,000 mg total) by mouth at bedtime. 360 tablet 0  . doxycycline (VIBRA-TABS) 100 MG tablet Take 1 tablet (100 mg total) by mouth 2 (two) times daily. 15 tablet 0  . FLUoxetine (PROZAC) 20 MG tablet Take 4 tablets (80 mg total) by mouth daily. 360 tablet 0  . gabapentin (NEURONTIN) 100 MG capsule Take 1 capsule (100 mg total) by mouth 3 (three) times daily. 60 capsule 0  . lamoTRIgine (LAMICTAL) 25 MG tablet Take 1 tablet (25 mg total) by mouth daily. 90 tablet 0  . linaclotide (LINZESS) 290 MCG CAPS capsule Take 1 capsule (290 mcg  total) by mouth daily before breakfast. 30 capsule 3  . SAXENDA 18 MG/3ML SOPN   0  . traZODone (DESYREL) 50 MG tablet 25-50 mg at night as needed for  sleep 30 tablet 2  . valACYclovir (VALTREX) 1000 MG tablet Take 1 tablet (1,000 mg total) by mouth 3 (three) times daily for 7 days. 21 tablet 0  . [START ON 01/28/2018] zolpidem (AMBIEN) 5 MG tablet Take 1 tablet (5 mg total) by mouth at bedtime. 30 tablet 2  . diazepam (VALIUM) 5 MG tablet Take 1 tablet (5 mg total) by mouth 2 (two) times daily as needed for anxiety. 60 tablet 2  . Prucalopride Succinate (MOTEGRITY) 2 MG TABS Take 2 mg by mouth daily. Samples of this drug were given to the patient, Lot Number [id number] (Patient not taking: Reported on 11/21/2017) 7 tablet 0  . QUEtiapine (SEROQUEL) 100 MG tablet Take 1 tablet (100 mg total) by mouth 2 (two) times daily. 180 tablet 0  . QUEtiapine (SEROQUEL) 200 MG tablet Take 1 tablet (200 mg total) by mouth at bedtime. 90 tablet 0  . varenicline (CHANTIX CONTINUING MONTH PAK) 1 MG tablet Take 1 tablet (1 mg total) by mouth 2 (two) times daily. (Patient not taking: Reported on 12/31/2017) 60 tablet 2   Current Facility-Administered Medications  Medication Dose Route Frequency Provider Last Rate Last Dose  . 0.9 %  sodium chloride infusion  500 mL Intravenous Once Iva Boop, MD         Musculoskeletal: Strength & Muscle Tone: within normal limits Gait & Station: normal Patient leans: N/A  Psychiatric Specialty Exam: Review of Systems  Skin:       No rash, but neuropathic pain on left side of her body  Psychiatric/Behavioral: Positive for depression. Negative for hallucinations, memory loss, substance abuse and suicidal ideas. The patient is nervous/anxious and has insomnia.   All other systems reviewed and are negative.   Blood pressure 105/74, pulse 98, height 5\' 4"  (1.626 m), weight 227 lb (103 kg), SpO2 95 %.Body mass index is 38.96 kg/m.  General Appearance: Fairly Groomed  Eye  Contact:  Good  Speech:  Clear and Coherent  Volume:  Normal  Mood:  Depressed  Affect:  Appropriate, Congruent, Restricted and fatigue, down  Thought Process:  Coherent  Orientation:  Full (Time, Place, and Person)  Thought Content: Logical   Suicidal Thoughts:  No  Homicidal Thoughts:  No  Memory:  Immediate;   Good  Judgement:  Good  Insight:  Fair  Psychomotor Activity:  Normal  Concentration:  Concentration: Good and Attention Span: Good  Recall:  Good  Fund of Knowledge: Good  Language: Good  Akathisia:  No  Handed:  Right  AIMS (if indicated): not done  Assets:  Communication Skills Desire for Improvement  ADL's:  Intact  Cognition: WNL  Sleep:  Poor   Screenings: PHQ2-9     Nutrition from [facility]/06/2017 [facility] Visit from 06/08/2016 in [facility] Office Visit from 03/15/2015 in [facility] Office Visit from 01/12/2015 in [facility]  PHQ-2 Total Score  0  0  0  0  PHQ-9 Total Score  -  2  -  -       Assessment and Plan:  CATERINE MCMEANS is a 47 y.o. year old female with a history of bipolar II disorder, anxiety, anemia, obesity s/p gastric bypass surgery,  , who presents for follow up appointment for Bipolar affective disorder, currently depressed, mild (HCC) - Plan: FLUoxetine (PROZAC) 20 MG tablet, QUEtiapine (SEROQUEL) 200 MG tablet, zolpidem (AMBIEN) 5 MG tablet,  Valproic acid level, Comprehensive Metabolic Panel (CMET), CBC  # Bipolar II disorder, mixed # Unspecified anxiety disorder Exam is notable for down affect, and patient reports worsening in her depression and anxiety, which coincided with pain on her left side with provisional diagnosis of shingles and her son with relationship issues. Will uptitrate Depakote to optimize its effect for bipolar II disorder. Will continue fluoxetine to target depression and anxiety.   Will continue lamotrigine to target bipolar 2 disorder.  Discussed risk of Stevens-Johnson syndrome.  Will continue quetiapine for bipolar 2 disorder.  Discussed metabolic side effect.  We will switch from Ativan to diazepam given patient preference.  Discussed risk of dependence and oversedation.  Will continue Ambien as needed for anxiety.  Will hold trazodone at this time to avoid oversedation.  Discussed behavioral activation.  The patient is strongly advised to be followed by her primary care to optimize pain management, which is contributing to her mood and insomnia.   Plan: I have reviewed and updated plans as below 1.IncreaseDepakote 2000 mg at night  3. Continue fluoxetine 80 mg daily  4. Continue lamotrigine 25 mg daily 5. Continue quetiapine 100 mg twice a day and 200 mg at night 6. Discontinue Ativan 7. Start diazepam 5 mg twice a day as needed for anxiety  8. ContinueAmbien5 mg at night as needed for sleep  9. Hold Trazodone at this time 10.Return to clinic inthree monthsfor 30 mins  Obtain blood test (VPA, CMP, CBC) five days after Depakote uptitration Advised to discuss with PCP for pain treatment - She is on gabapentin 100 mg TID, prescribed at ED  I have reviewed suicide assessment in detail. No change in the following assessment.   The patient demonstrates the following risk factors for suicide: Chronic risk factorsfor suicide include psychiatric disorder /bipolar disorder,previous self-harm of scratching herself. Acute risk factorsfor suicide includenone. Protective factorsfor this patient include positive social support, positive therapeutic relationship,hope for the future. Considering these factors, the overall suicide risk at this point appears to be low. Patient does have gun access at home, which she declined to lock. Discussed in detail safety plan that anytime having active suicidal thoughts or homicidal thoughts and she need to call 911 or go to local  emergency room.   The duration of this appointment visit was 30 minutes of face-to-face time with the patient.  Greater than 50% of this time was spent in counseling, explanation of  diagnosis, planning of further management, and coordination of care.  Neysa Hottereina Dessie Tatem, MD 12/31/2017, 10:58 AM

## 2017-12-27 ENCOUNTER — Encounter: Payer: Self-pay | Admitting: Internal Medicine

## 2017-12-27 MED FILL — QUETIAPINE FUMARATE 100 MG: 100 | 30 days supply | Qty: 60 | Fill #2

## 2017-12-27 MED FILL — ZOLPIDEM TARTRATE 5 MG TAB: 5 | 30 days supply | Qty: 30 | Fill #2

## 2017-12-27 MED FILL — SAXENDA 18 MG/3 ML PEN: 18 | 30 days supply | Qty: 15 | Fill #0

## 2017-12-27 MED FILL — LORazepam 1 MG TABS: 1 | 30 days supply | Qty: 90 | Fill #1

## 2017-12-27 NOTE — Telephone Encounter (Signed)
DR paz will not be back until Tuesday afternoon--- it does not look like she had abd pain when she saw him She would need to be seen

## 2017-12-28 ENCOUNTER — Other Ambulatory Visit: Payer: Self-pay

## 2017-12-28 ENCOUNTER — Emergency Department (HOSPITAL_BASED_OUTPATIENT_CLINIC_OR_DEPARTMENT_OTHER): Payer: No Typology Code available for payment source

## 2017-12-28 ENCOUNTER — Ambulatory Visit (INDEPENDENT_AMBULATORY_CARE_PROVIDER_SITE_OTHER): Payer: No Typology Code available for payment source | Admitting: Family

## 2017-12-28 ENCOUNTER — Encounter (HOSPITAL_BASED_OUTPATIENT_CLINIC_OR_DEPARTMENT_OTHER): Payer: Self-pay

## 2017-12-28 ENCOUNTER — Emergency Department (HOSPITAL_BASED_OUTPATIENT_CLINIC_OR_DEPARTMENT_OTHER)
Admission: EM | Admit: 2017-12-28 | Discharge: 2017-12-28 | Disposition: A | Discharge status: Home / Self Care | Payer: No Typology Code available for payment source | Attending: Emergency Medicine | Admitting: Emergency Medicine

## 2017-12-28 ENCOUNTER — Encounter: Payer: Self-pay | Admitting: Family

## 2017-12-28 VITALS — BP 90/48 | HR 84 | Temp 98.8°F | Resp 18 | Ht 64.0 in | Wt 227.0 lb

## 2017-12-28 DIAGNOSIS — R0781 Pleurodynia: Secondary | ICD-10-CM | POA: Diagnosis not present

## 2017-12-28 DIAGNOSIS — R079 Chest pain, unspecified: Secondary | ICD-10-CM | POA: Diagnosis present

## 2017-12-28 DIAGNOSIS — Z87891 Personal history of nicotine dependence: Secondary | ICD-10-CM | POA: Insufficient documentation

## 2017-12-28 DIAGNOSIS — R0789 Other chest pain: Secondary | ICD-10-CM | POA: Insufficient documentation

## 2017-12-28 DIAGNOSIS — Z79899 Other long term (current) drug therapy: Secondary | ICD-10-CM | POA: Diagnosis not present

## 2017-12-28 DIAGNOSIS — M5489 Other dorsalgia: Secondary | ICD-10-CM | POA: Insufficient documentation

## 2017-12-28 DIAGNOSIS — M792 Neuralgia and neuritis, unspecified: Secondary | ICD-10-CM | POA: Insufficient documentation

## 2017-12-28 LAB — COMPREHENSIVE METABOLIC PANEL
ALBUMIN: 3.8 g/dL (ref 3.5–5.0)
ALK PHOS: 56 U/L (ref 38–126)
ALT: 17 U/L (ref 0–44)
ANION GAP: 7 (ref 5–15)
AST: 24 U/L (ref 15–41)
BILIRUBIN TOTAL: 0.3 mg/dL (ref 0.3–1.2)
BUN: 17 mg/dL (ref 6–20)
CALCIUM: 8.8 mg/dL — AB (ref 8.9–10.3)
CO2: 28 mmol/L (ref 22–32)
CREATININE: 0.82 mg/dL (ref 0.44–1.00)
Chloride: 105 mmol/L (ref 98–111)
GFR calc Af Amer: >60 mL/min (ref >60)
GFR calc non Af Amer: >60 mL/min (ref >60)
Glucose, Bld: 72 mg/dL (ref 70–99)
Potassium: 5 mmol/L (ref 3.5–5.1)
Sodium: 140 mmol/L (ref 135–145)
TOTAL PROTEIN: 6.6 g/dL (ref 6.5–8.1)

## 2017-12-28 LAB — CBC WITH DIFFERENTIAL/PLATELET
BASOS PCT: 1 %
Basophils Absolute: 0 10*3/uL (ref 0.0–0.1)
Eosinophils Absolute: 0.5 10*3/uL (ref 0.0–0.7)
Eosinophils Relative: 8 %
HEMATOCRIT: 38.4 % (ref 36.0–46.0)
HEMOGLOBIN: 13 g/dL (ref 12.0–15.0)
Lymphocytes Relative: 52 %
Lymphs Abs: 3.1 10*3/uL (ref 0.7–4.0)
MCH: 30.7 pg (ref 26.0–34.0)
MCHC: 33.9 g/dL (ref 30.0–36.0)
MCV: 90.8 fL (ref 78.0–100.0)
Monocytes Absolute: 0.5 10*3/uL (ref 0.1–1.0)
Monocytes Relative: 8 %
NEUTROS ABS: 1.8 10*3/uL (ref 1.7–7.7)
NEUTROS PCT: 31 %
Platelets: 277 10*3/uL (ref 150–400)
RBC: 4.23 MIL/uL (ref 3.87–5.11)
RDW: 12.4 % (ref 11.5–15.5)
WBC: 5.9 10*3/uL (ref 4.0–10.5)

## 2017-12-28 LAB — URINALYSIS, ROUTINE W REFLEX MICROSCOPIC
Bilirubin Urine: NEGATIVE
GLUCOSE, UA: NEGATIVE mg/dL
HGB URINE DIPSTICK: NEGATIVE
Ketones, ur: NEGATIVE mg/dL
LEUKOCYTES UA: NEGATIVE
Nitrite: NEGATIVE
Protein, ur: NEGATIVE mg/dL
pH: 5.5 (ref 5.0–8.0)

## 2017-12-28 LAB — LIPASE, BLOOD: Lipase: 44 U/L (ref 11–51)

## 2017-12-28 LAB — TROPONIN I: Troponin I: <0.03 ng/mL (ref <0.03)

## 2017-12-28 LAB — D-DIMER, QUANTITATIVE: D-Dimer, Quant: <0.27 ug/mL-FEU (ref 0.00–0.50)

## 2017-12-28 MED ORDER — VALACYCLOVIR HCL 1 G PO TABS: 1000.0000 mg | ORAL_TABLET | Freq: Three times a day (TID) | ORAL | 0 refills | Status: DC

## 2017-12-28 MED ORDER — GABAPENTIN 100 MG PO CAPS: 100.0000 mg | ORAL_CAPSULE | Freq: Three times a day (TID) | ORAL | 0 refills | Status: DC

## 2017-12-28 MED ORDER — HYDROMORPHONE HCL 1 MG/ML IJ SOLN
0.5000 mg | Freq: Once | INTRAMUSCULAR | Status: AC
  Administered 2017-12-28: 0.5 mg via INTRAVENOUS
  Filled 2017-12-28: qty 1

## 2017-12-28 MED ORDER — SODIUM CHLORIDE 0.9 % IV BOLUS: 1000.0000 mL | Freq: Once | INTRAVENOUS | Status: DC

## 2017-12-28 MED ORDER — IOPAMIDOL (ISOVUE-370) INJECTION 76%
100.0000 mL | Freq: Once | INTRAVENOUS | Status: AC | PRN
  Administered 2017-12-28: 100 mL via INTRAVENOUS

## 2017-12-28 MED ORDER — SODIUM CHLORIDE 0.9 % IV BOLUS
1000.0000 mL | Freq: Once | INTRAVENOUS | Status: AC
  Administered 2017-12-28: 1000 mL via INTRAVENOUS

## 2017-12-28 MED FILL — valACYclovir HCL 1 GM TABS: 1 | 7 days supply | Qty: 21 | Fill #0

## 2017-12-28 MED FILL — GABAPENTIN 100 MG CAPSULE: 100 | 20 days supply | Qty: 60 | Fill #0

## 2017-12-28 NOTE — Progress Notes (Signed)
Subjective:    Patient ID: Ashley Franklin, female    DOB: 1970/12/12, 47 y.o.   MRN: [id number]  HPI  Woke up yesterday AM with c/o left sided chest pain. Reports that she can't lay, sit.  Reports constant sweat x 2 days. Report sensation that all her extremities feel tingly feeling.  Hurts to breath.  Has had some associated nausea/vomiting.  Denies rash. Took aleve, hydrocodone and tramadol without improvement in her symptoms.    Review of Systems    see HPI Past Medical History:  Diagnosis Date  . Anemia   . Anxiety   . Bipolar affective disorder (HCC)   . Blood transfusion without reported diagnosis   . Bronchospasm with bronchitis, acute   . Depression   . Kidney stone   . SBO (small bowel obstruction) (HCC) 01/2014     Social History   Socioeconomic History  . Marital status: Married    Spouse name: Not on file  . Number of children: 2  . Years of education: Not on file  . Highest education level: Not on file  Occupational History  . Occupation: phelbotomist    Employer: [employer]  Social Needs  . Financial resource strain: Not on file  . Food insecurity:    Worry: Not on file    Inability: Not on file  . Transportation needs:    Medical: Not on file    Non-medical: Not on file  Tobacco Use  . Smoking status: Former Smoker    Packs/day: 0.50    Years: 6.00    Pack years: 3.00    Last attempt to quit: 05/11/2014    Years since quitting: 3.6  . Smokeless tobacco: Never Used  . Tobacco comment: 1/2 ppd   Substance and Sexual Activity  . Alcohol use: No    Alcohol/week: 0.0 standard drinks  . Drug use: No  . Sexual activity: Yes    Partners: Male    Birth control/protection: Surgical  Lifestyle  . Physical activity:    Days per week: Not on file    Minutes per session: Not on file  . Stress: Not on file  Relationships  . Social connections:    Talks on phone: Not on file    Gets together: Not on file    Attends religious service: Not on file    Active member of club or organization: Not on file    Attends meetings of clubs or organizations: Not on file    Relationship status: Not on file  . Intimate partner violence:    Fear of current or ex partner: Not on file    Emotionally abused: Not on file    Physically abused: Not on file    Forced sexual activity: Not on file  Other Topics Concern  . 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 CESAREAN SECTION    . ENDOMETRIAL ABLATION    . GASTRIC BYPASS  2001  . SBO  2015  . TUBAL LIGATION      Family History  Problem Relation Age of Onset  .  Diabetes Mother   . Cancer Mother 60       Throat   . Bipolar disorder Mother   . Bipolar disorder Sister   . Bipolar disorder Brother   . Drug abuse Brother   . Bipolar disorder Sister   . Colon cancer Neg Hx   . Cancer - Prostate Neg Hx   . Breast cancer Neg Hx     Allergies  Allergen Reactions  . Nsaids Other (See Comments)    Contraindicated with gastric bypass    Current Outpatient Medications on File Prior to Visit  Medication Sig Dispense Refill  . budesonide-formoterol (SYMBICORT) 160-4.5 MCG/ACT inhaler Inhale 2 puffs into the lungs 2 (two) times daily. 1 Inhaler 3  . divalproex (DEPAKOTE ER) 500 MG 24 hr tablet Take 3 tablets (1,500 mg total) by mouth at bedtime. 270 tablet 0  . doxycycline (VIBRA-TABS) 100 MG tablet Take 1 tablet (100 mg total) by mouth 2 (two) times daily. 15 tablet 0  . FLUoxetine (PROZAC) 20 MG tablet Take 4 tablets (80 mg total) by mouth daily. 360 tablet 0  . lamoTRIgine (LAMICTAL) 25 MG tablet Take 1 tablet (25 mg total) by mouth daily. 90 tablet 0  .  linaclotide (LINZESS) 290 MCG CAPS capsule Take 1 capsule (290 mcg total) by mouth daily before breakfast. 30 capsule 3  . LORazepam (ATIVAN) 1 MG tablet Take 1 tablet (1 mg total) by mouth 3 (three) times daily as needed for anxiety. 90 tablet 2  . Prucalopride Succinate (MOTEGRITY) 2 MG TABS Take 2 mg by mouth daily. Samples of this drug were given to the patient, Lot Number [id number] (Patient not taking: Reported on 11/21/2017) 7 tablet 0  . QUEtiapine (SEROQUEL) 100 MG tablet Take 1 tablet (100 mg total) by mouth 2 (two) times daily. 180 tablet 0  . QUEtiapine (SEROQUEL) 200 MG tablet Take 1 tablet (200 mg total) by mouth at bedtime. 90 tablet 0  . SAXENDA 18 MG/3ML SOPN   0  . traZODone (DESYREL) 50 MG tablet 25-50 mg at night as needed for sleep 30 tablet 2  . varenicline (CHANTIX CONTINUING MONTH PAK) 1 MG tablet Take 1 tablet (1 mg total) by mouth 2 (two) times daily. 60 tablet 2  . zolpidem (AMBIEN) 5 MG tablet Take 1 tablet (5 mg total) by mouth at bedtime. 30 tablet 2   Current Facility-Administered Medications on File Prior to Visit  Medication Dose Route Frequency Provider Last Rate Last Dose  . 0.9 %  sodium chloride infusion  500 mL Intravenous Once Iva BoopGessner, Carl E, MD        BP (!) 90/48 (BP Location: Right Arm, Patient Position: Standing, Cuff Size: Large)   Pulse 84   Temp 98.8 F (37.1 C) (Oral)   Resp 18   Ht 5\' 4"  (1.626 m)   Wt 227 lb (103 kg)   SpO2 99%   BMI 38.96 kg/m     Objective:   Physical Exam  Constitutional: She is oriented to person, place, and time. She appears well-developed and well-nourished.  Appears extremely uncomfortable  Cardiovascular: Normal rate, regular rhythm and normal heart sounds.  No murmur heard. Pulmonary/Chest: Effort normal and breath sounds normal. No respiratory distress. She has no wheezes.  Neurological: She is alert and oriented to person, place, and time.  Skin: Skin is warm and dry.  No rash noted left flank but has  tenders to papatioon along the left flank    Psychiatric: She has a normal  mood and affect. Her behavior is normal. Judgment and thought content normal.          Assessment & Plan:  Rib pain- uncontrolled.  ? Cardiac etiology versus pulmonary other such as early Herpes zoster. EKG is performed and personally  Reviewed.  Notes TWI in anterior leads- V1 and V2. Report given to Dr[facility] Ranae PalmsYelverton [facility].

## 2017-12-28 NOTE — ED Triage Notes (Signed)
Pt c/o pain to left rib area-states she woke yesterday am with pain-also c/o SOB, sweating, tingling in lips and extremities-denies injury-nausea-vomited x 3-pt NAD-steady gait-pt sent from PCP office

## 2017-12-28 NOTE — ED Notes (Signed)
Family at bedside. 

## 2017-12-28 NOTE — Patient Instructions (Addendum)
Please go directly to the ED on the first floor for additional evaluation.

## 2017-12-28 NOTE — ED Provider Notes (Signed)
[facility] EMERGENCY DEPARTMENT Provider Note   CSN: [id number] Arrival date & time: 12/28/17  1139     History   Chief Complaint Chief Complaint  Patient presents with  . Chest Pain    Left rib area    HPI Ashley Franklin is a 47 y.o. female.  HPI Patient presents with left lateral chest pain that wraps around to the left thoracic back.  Pain was present when she woke up yesterday morning.  States that pain is exacerbated even with light touch.  No known injury.  Patient states she has a cough productive.  She does endorse shortness of breath due to pain.  She describes paresthesias to her extremities.  No fever or chills.  No abdominal pain. Past Medical History:  Diagnosis Date  . Anemia   . Anxiety   . Bipolar affective disorder (HCC)   . Blood transfusion without reported diagnosis   . Bronchospasm with bronchitis, acute   . Depression   . Kidney stone   . SBO (small bowel obstruction) (HCC) 01/2014    Patient Active Problem List   Diagnosis Date Noted  . Left ankle injury, subsequent encounter 08/04/2017  . PCP NOTES >>>>>>>>>> 07/11/2017  . 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 Chronic knee pain 04/18/2015  . Plantar fasciitis 04/18/2015  . Tobacco use disorder 03/15/2015  . Morbid obesity (HCC) 01/05/2015  . Bipolar affective disorder (HCC) 01/02/2015    Past Surgical History:  Procedure Laterality Date  . CESAREAN SECTION    . ENDOMETRIAL ABLATION    . GASTRIC BYPASS  2001  . SBO  2015  . TUBAL LIGATION       OB History   None      Home Medications    Prior to Admission medications   Medication Sig Start Date End Date Taking? Authorizing Provider  budesonide-formoterol (SYMBICORT) 160-4.5 MCG/ACT inhaler Inhale 2 puffs into the lungs 2 (two) times daily. 11/21/17   Wanda PlumpPaz, Jose E, MD  divalproex (DEPAKOTE ER) 500 MG 24 hr tablet Take 3 tablets (1,500 mg total) by mouth at bedtime. 10/01/17   Neysa HotterHisada, Reina, MD  doxycycline (VIBRA-TABS) 100 MG  tablet Take 1 tablet (100 mg total) by mouth 2 (two) times daily. 11/21/17   Wanda PlumpPaz, Jose E, MD  FLUoxetine (PROZAC) 20 MG tablet Take 4 tablets (80 mg total) by mouth daily. 10/01/17   Neysa HotterHisada, Reina, MD  gabapentin (NEURONTIN) 100 MG capsule Take 1 capsule (100 mg total) by mouth 3 (three) times daily. 12/28/17   Loren RacerYelverton, Dashay Giesler, MD  lamoTRIgine (LAMICTAL) 25 MG tablet Take 1 tablet (25 mg total) by mouth daily. 10/01/17   Neysa HotterHisada, Reina, MD  linaclotide Karlene Einstein(LINZESS) 290 MCG CAPS capsule Take 1 capsule (290 mcg total) by mouth daily before breakfast. 08/29/17   Iva BoopGessner, Carl E, MD  LORazepam (ATIVAN) 1 MG tablet Take 1 tablet (1 mg total) by mouth 3 (three) times daily as needed for anxiety. 10/01/17   Neysa HotterHisada, Reina, MD  Prucalopride Succinate (MOTEGRITY) 2 MG TABS Take 2 mg by mouth daily. Samples of this drug were given to the patient, Lot Number [id number] Patient not taking: Reported on 11/21/2017 10/15/17   Iva BoopGessner, Carl E, MD  QUEtiapine (SEROQUEL) 100 MG tablet Take 1 tablet (100 mg total) by mouth 2 (two) times daily. 10/01/17 12/30/17  Neysa HotterHisada, Reina, MD  QUEtiapine (SEROQUEL) 200 MG tablet Take 1 tablet (200 mg total) by mouth at bedtime. 10/01/17 12/30/17  Neysa HotterHisada, Reina, MD  SAXENDA 18 MG/3ML SOPN  09/20/17   [provider]  traZODone (DESYREL) 50 MG tablet 25-50 mg at night as needed for sleep 07/02/17   [name], MD  valACYclovir (VALTREX) 1000 MG tablet Take 1 tablet (1,000 mg total) by mouth 3 (three) times daily for 7 days. 12/28/17 01/04/18  Loren Racer, MD  varenicline (CHANTIX CONTINUING MONTH PAK) 1 MG tablet Take 1 tablet (1 mg total) by mouth 2 (two) times daily. 07/10/17   Wanda Plump, MD  zolpidem (AMBIEN) 5 MG tablet Take 1 tablet (5 mg total) by mouth at bedtime. 10/29/17   [name], MD    Family History Family History  Problem Relation Age of Onset  . Diabetes Mother   . Cancer Mother 60       Throat   . Bipolar disorder Mother   . Bipolar disorder Sister   . Bipolar  disorder Brother   . Drug abuse Brother   . Bipolar disorder Sister   . Colon cancer Neg Hx   . Cancer - Prostate Neg Hx   . Breast cancer Neg Hx     Social History Social History   Tobacco Use  . Smoking status: Former Smoker    Packs/day: 0.50    Years: 6.00    Pack years: 3.00    Last attempt to quit: 05/11/2014    Years since quitting: 3.6  . Smokeless tobacco: Never Used  . Tobacco comment: 1/2 ppd   Substance Use Topics  . Alcohol use: No    Alcohol/week: 0.0 standard drinks  . Drug use: No     Allergies   Nsaids   Review of Systems Review of Systems  Constitutional: Negative for chills and fever.  Respiratory: Positive for cough and shortness of breath. Negative for wheezing.   Cardiovascular: Positive for chest pain. Negative for leg swelling.  Gastrointestinal: Negative for abdominal pain, constipation, diarrhea, nausea and vomiting.  Genitourinary: Negative for dysuria, flank pain, frequency and hematuria.  Musculoskeletal: Positive for back pain and myalgias. Negative for neck pain and neck stiffness.  Skin: Negative for rash and wound.  Neurological: Positive for numbness. Negative for dizziness, weakness, light-headedness and headaches.  All other systems reviewed and are negative.    Physical Exam Updated Vital Signs BP 113/82 (BP Location: Left Arm)   Pulse 62   Temp 97.8 F (36.6 C) (Oral)   Resp 16   SpO2 100%   Physical Exam  Constitutional: She is oriented to person, place, and time. She appears well-developed and well-nourished. No distress.  HENT:  Head: Normocephalic and atraumatic.  Mouth/Throat: Oropharynx is clear and moist.  Eyes: Pupils are equal, round, and reactive to light. EOM are normal.  Neck: Normal range of motion. Neck supple.  Cardiovascular: Normal rate and regular rhythm. Exam reveals no gallop and no friction rub.  No murmur heard. Pulmonary/Chest: Effort normal and breath sounds normal. No respiratory distress.  She has no wheezes. She has no rales. She exhibits tenderness.  Patient with left inferior chest wall tenderness at the base of the ribs.  Tender even with light touch.  No obvious rashes or evidence of injury.  No crepitance or deformity.  Abdominal: Soft. Bowel sounds are normal. She exhibits no distension and no mass. There is no tenderness. There is no rebound and no guarding. No hernia.  Musculoskeletal: Normal range of motion. She exhibits tenderness. She exhibits no edema.  Tenderness with palpation along the left inferior thoracic back.  No pinpoint tenderness.  Tender even to  light touch.  No midline thoracic or lumbar tenderness.  No definite CVA tenderness.  No lower extremity swelling, asymmetry or tenderness.  Neurological: She is alert and oriented to person, place, and time.  Moving all extremities without deficit.  Sensation intact.  Skin: Skin is warm and dry. Capillary refill takes less than 2 seconds. No rash noted. She is not diaphoretic. No erythema.  Psychiatric: She has a normal mood and affect. Her behavior is normal.  Nursing note and vitals reviewed.    ED Treatments / Results  Labs (all labs ordered are listed, but only abnormal results are displayed) Labs Reviewed  COMPREHENSIVE METABOLIC PANEL - Abnormal; Notable for the following components:      Result Value   Calcium 8.8 (*)    All other components within normal limits  URINALYSIS, ROUTINE W REFLEX MICROSCOPIC - Abnormal; Notable for the following components:   Specific Gravity, Urine >1.030 (*)    All other components within normal limits  CBC WITH DIFFERENTIAL/PLATELET  D-DIMER, QUANTITATIVE (NOT AT [facility])  LIPASE, BLOOD  TROPONIN I    EKG EKG Interpretation  Date/Time:  Friday December 28 2017 11:56:35 EDT Ventricular Rate:  62 PR Interval:    QRS Duration: 96 QT Interval:  400 QTC Calculation: 407 R Axis:   82 Text Interpretation:  Sinus rhythm Low voltage, extremity leads Confirmed by  Loren Racer ([id number]) on 12/28/2017 12:32:17 PM   Radiology Dg Chest 2 View  Result Date: 12/28/2017 CLINICAL DATA:  Left-side chest pain since last night. EXAM: CHEST - 2 VIEW COMPARISON:  PA and lateral chest 02/24/2015, 11/21/2017. FINDINGS: The lungs are clear. Heart size is normal. No pneumothorax or pleural effusion. No acute or focal bony abnormality. IMPRESSION: Normal chest. Electronically Signed   By: Drusilla Kanner M.D.   On: 12/28/2017 12:31   Ct Angio Chest Pe W And/or Wo Contrast  Result Date: 12/28/2017 CLINICAL DATA:  Left upper chest pain with shortness of breath for 1 day. Nonsmoker. High pretest probability for pulmonary embolism. EXAM: CT ANGIOGRAPHY CHEST WITH CONTRAST TECHNIQUE: Multidetector CT imaging of the chest was performed using the standard protocol during bolus administration of intravenous contrast. Multiplanar CT image reconstructions and MIPs were obtained to evaluate the vascular anatomy. CONTRAST:  ISOVUE-370 IOPAMIDOL (ISOVUE-370) INJECTION 76% COMPARISON:  Radiographs 12/28/2017 and 11/21/2017. FINDINGS: Cardiovascular: The pulmonary arteries are well opacified with contrast to the level of the subsegmental branches. There is no evidence of acute pulmonary embolism. Mild coronary artery atherosclerosis. There are prominent collateral vessels in the right shoulder region without definite central venous obstruction. The right subclavian vein may be mildly narrowed where it passes under the medial clavicle. The heart size is normal. There is no pericardial effusion. Mediastinum/Nodes: There are no enlarged mediastinal, hilar or axillary lymph nodes.There is a small hiatal hernia. The thyroid gland and trachea appear unremarkable. Lungs/Pleura: There is no pleural effusion or pneumothorax. Mild linear atelectasis or scarring in the lingula. The lungs are otherwise clear. Upper abdomen: Postsurgical changes at the gastroesophageal junction. The visualized upper  abdomen otherwise appears unremarkable. Musculoskeletal/Chest wall: There is no chest wall mass or suspicious osseous finding. Review of the MIP images confirms the above findings. IMPRESSION: 1. No evidence of acute pulmonary embolism. 2. Prominent collateral vessels in the right shoulder region with possible central venous stenosis involving the subclavian vein. This could be positional. No acute vascular findings. 3. Mild coronary artery atherosclerosis. Electronically Signed   By: Carey Bullocks M.D.   On:  12/28/2017 15:19    Procedures Procedures (including critical care time)  Medications Ordered in ED Medications  HYDROmorphone (DILAUDID) injection 0.5 mg (0.5 mg Intravenous Given 12/28/17 1332)  sodium chloride 0.9 % bolus 1,000 mL ( Intravenous Stopped 12/28/17 1440)  iopamidol (ISOVUE-370) 76 % injection 100 mL (100 mLs Intravenous Contrast Given 12/28/17 1459)     Initial Impression / Assessment and Plan / ED Course  I have reviewed the triage vital signs and the nursing notes.  Pertinent labs & imaging results that were available during my care of the patient were reviewed by me and considered in my medical decision making (see chart for details).     EKG, troponin, d-dimer are normal.  Patient has normal white blood cell count.  She is not anemic.  Normal kidney function.  UA without signs of infection or hematuria.  Pain is reproduced with very light palpation over the skin.  No rashes present.  Question whether this is early shingles outbreak.  Husband is now in the room.  Patient is writhing around in bed where she was comfortable appearing on initial presentation.  Patient does have a history of borderline personality disorder and bipolar disorder.  This may be affecting her presentation.  Husband is worried that the patient is still in significant pain.  Have given half milligram of Dilaudid with no improvement.  Patient is taking narcotics at home.  Blood pressure in the low  100s.  Explained that cannot give more narcotic medications because of this.  Patient states she cannot take NSAIDs due to history of gastric bypass.  Offered to get CT of the chest which the husband and patient consent to.   CT without acute findings or explanation for patient's symptoms.  Will prescribe gabapentin and Valtrex.  Patient has narcotics which she is taking at home.  Return precautions have been given.  Final Clinical Impressions(s) / ED Diagnoses   Final diagnoses:  Neuropathic pain    ED Discharge Orders         Ordered    gabapentin (NEURONTIN) 100 MG capsule  3 times daily     12/28/17 1527    valACYclovir (VALTREX) 1000 MG tablet  3 times daily     12/28/17 1527           Loren RacerYelverton, Jovan Colligan, MD 12/28/17 1528

## 2017-12-28 NOTE — ED Notes (Signed)
ED Provider at bedside. 

## 2017-12-28 NOTE — ED Notes (Signed)
Patient transported to CT 

## 2017-12-31 ENCOUNTER — Encounter (HOSPITAL_COMMUNITY): Payer: Self-pay | Admitting: Psychiatry

## 2017-12-31 ENCOUNTER — Telehealth: Payer: Self-pay

## 2017-12-31 ENCOUNTER — Ambulatory Visit (INDEPENDENT_AMBULATORY_CARE_PROVIDER_SITE_OTHER): Payer: No Typology Code available for payment source | Admitting: Psychiatry

## 2017-12-31 DIAGNOSIS — F3131 Bipolar disorder, current episode depressed, mild: Secondary | ICD-10-CM | POA: Diagnosis not present

## 2017-12-31 DIAGNOSIS — G4700 Insomnia, unspecified: Secondary | ICD-10-CM | POA: Diagnosis not present

## 2017-12-31 DIAGNOSIS — R450 Nervousness: Secondary | ICD-10-CM

## 2017-12-31 MED ORDER — ZOLPIDEM TARTRATE 5 MG PO TABS: 5.0000 mg | ORAL_TABLET | Freq: Every day | ORAL | 2 refills | Status: DC

## 2017-12-31 MED ORDER — LAMOTRIGINE 25 MG PO TABS: 25.0000 mg | ORAL_TABLET | Freq: Every day | ORAL | 0 refills | Status: DC

## 2017-12-31 MED ORDER — FLUOXETINE HCL 20 MG PO TABS: 80.0000 mg | ORAL_TABLET | Freq: Every day | ORAL | 0 refills | Status: DC

## 2017-12-31 MED ORDER — QUETIAPINE FUMARATE 100 MG PO TABS: 100.0000 mg | ORAL_TABLET | Freq: Two times a day (BID) | ORAL | 0 refills | Status: DC

## 2017-12-31 MED ORDER — DIAZEPAM 5 MG PO TABS: 5.0000 mg | ORAL_TABLET | Freq: Two times a day (BID) | ORAL | 2 refills | Status: DC | PRN

## 2017-12-31 MED ORDER — DIVALPROEX SODIUM ER 500 MG PO TB24: 2000.0000 mg | ORAL_TABLET | Freq: Every day | ORAL | 0 refills | Status: DC

## 2017-12-31 MED ORDER — QUETIAPINE FUMARATE 200 MG PO TABS: 200.0000 mg | ORAL_TABLET | Freq: Every day | ORAL | 0 refills | Status: DC

## 2017-12-31 MED FILL — diazePAM 5 MG TABS: 5 | 30 days supply | Qty: 60 | Fill #0

## 2017-12-31 MED FILL — DIVALPROEX SOD ER 500 MG TA: 500 | 90 days supply | Qty: 360 | Fill #0

## 2017-12-31 MED FILL — lamoTRIgine 25 MG TABS: 25 | 90 days supply | Qty: 90 | Fill #0

## 2017-12-31 MED FILL — QUETIAPINE FUMARATE 200 MG: 200 | 90 days supply | Qty: 90 | Fill #0

## 2017-12-31 NOTE — Patient Instructions (Signed)
1.IncreaseDepakote 2000 mg at night  2. Obtain blood test after five days of increasing depakote 3. Continue fluoxetine 80 mg daily  4. Continue lamotrigine 25 mg daily 5. Continue quetiapine 100 mg twice a day and 200 mg at night 6. Discontinue Ativan 7. Start diazepam 5 mg twice a day as needed for anxiety  8. ContinueAmbien5 mg at night as needed for sleep  9. Hold Trazodone at this time 10.Return to clinic inthree monthsfor 30 mins  Obtain blood test (VPA, CMP, CBC) 11. Discuss with your primary care doctor about your pain treatment

## 2017-12-31 NOTE — Telephone Encounter (Signed)
Called patient to schedule ED follow up appointment left message for return call.

## 2018-01-01 ENCOUNTER — Encounter: Payer: Self-pay | Admitting: Medical

## 2018-01-01 ENCOUNTER — Ambulatory Visit (INDEPENDENT_AMBULATORY_CARE_PROVIDER_SITE_OTHER): Payer: No Typology Code available for payment source | Admitting: Medical

## 2018-01-01 VITALS — BP 104/76 | HR 89 | Temp 98.0°F | Resp 16 | Ht 64.0 in | Wt 226.6 lb

## 2018-01-01 DIAGNOSIS — B029 Zoster without complications: Secondary | ICD-10-CM

## 2018-01-01 DIAGNOSIS — H00014 Hordeolum externum left upper eyelid: Secondary | ICD-10-CM | POA: Diagnosis not present

## 2018-01-01 DIAGNOSIS — M792 Neuralgia and neuritis, unspecified: Secondary | ICD-10-CM | POA: Diagnosis not present

## 2018-01-01 MED ORDER — HYDROCODONE-ACETAMINOPHEN 5-325 MG PO TABS: 1.0000 | ORAL_TABLET | Freq: Four times a day (QID) | ORAL | 0 refills | Status: DC | PRN

## 2018-01-01 MED ORDER — VALACYCLOVIR HCL 1 G PO TABS: 1000.0000 mg | ORAL_TABLET | Freq: Three times a day (TID) | ORAL | 0 refills | Status: DC

## 2018-01-01 MED FILL — HYDROCODON-APAP 5-325: 5-325 | 3 days supply | Qty: 12 | Fill #0

## 2018-01-01 NOTE — Patient Instructions (Addendum)
For your shingles outbreak, I am refilling your antiviral, continue gabapentin and making norco available for severe pain.(limited number norco)   Work note to be off at least 7 more days. Possibly longer based on your occupation.  For small stye left upper eye lid do warm compress twice a day. I don't think you have shingles around your eye. But if you were to get vesicles around eye or rash let me know immediately.  Follow up in next week or as needed

## 2018-01-01 NOTE — Progress Notes (Signed)
Subjective:    Patient ID: Ashley Franklin, female    DOB: [birth year], 47 y.o.   MRN: [id number]  HPI   Pt in for evaluation.  Pt just broke out with shingles just yesterday. Went to ED on Friday after some severe pain left side of chest. Pt was evaluated in the ED. Extensive work up was negative that showed no pulmonary or cardiac cause.   Rash started yesterday on left side of flank that wrapped around dermatomal pattern.   Pt gave valacyclovir for shingles in ED in anticipation that she would likley get shingles out break as other work up was negative. Also gave gabapentin 100 mg tid. She states not sleeping well due to the pain. Hurts when she lies down. Pain level 7/10.  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  Pt blood pressure always on lower side.   Review of Systems  Constitutional: Negative for chills, fatigue and fever.  Respiratory: Negative for cough, chest tightness, shortness of breath and wheezing.   Cardiovascular: Negative for chest pain and palpitations.  Gastrointestinal: Negative for abdominal pain.  Skin: Positive for rash.       Dermatomal pattern. See hpi.  Neurological: Negative for dizziness, tremors, speech difficulty, weakness, light-headedness and numbness.  Hematological: Negative for adenopathy.  Psychiatric/Behavioral: Negative for behavioral problems and confusion.   Past Medical History:  Diagnosis Date  . Anemia   . Anxiety   . Bipolar affective disorder (HCC)   . Blood transfusion without reported diagnosis   . Bronchospasm with bronchitis, acute   . Depression   . Kidney stone   . SBO (small bowel obstruction) (HCC) 01/2014     Social History   Socioeconomic History  . Marital status: Married    Spouse name: Not on file  . Number of children: 2  . Years of education: Not on file  . Highest education level: Not on file  Occupational History  . Occupation: phelbotomist    Employer: [employer]  Social Needs  . Financial resource strain:  Not on file  . Food insecurity:    Worry: Not on file    Inability: Not on file  . Transportation needs:    Medical: Not on file    Non-medical: Not on file  Tobacco Use  . Smoking status: Former Smoker    Packs/day: 0.50    Years: 6.00    Pack years: 3.00    Last attempt to quit: 05/11/2014    Years since quitting: 3.6  . Smokeless tobacco: Never Used  . Tobacco comment: 1/2 ppd   Substance and Sexual Activity  . Alcohol use: No    Alcohol/week: 0.0 standard drinks  . Drug use: No  . Sexual activity: Yes    Partners: Male    Birth control/protection: Surgical  Lifestyle  . Physical activity:    Days per week: Not on file    Minutes per session: Not on file  . Stress: Not on file  Relationships  . Social connections:    Talks on phone: Not on file    Gets together: Not on file    Attends religious service: Not on file    Active member of club or organization: Not on file    Attends meetings of clubs or organizations: Not on file    Relationship status: Not on file  . Intimate partner violence:    Fear of current or ex partner: Not on file    Emotionally abused: Not on file  Physically abused: Not on file    Forced sexual activity: Not on file  Other Topics Concern  . Not on file  Social History Narrative   The patient is a Water quality scientistphlebotomist at [employer]    1 son born 1988-08-13 daughter born in [id number] she is married and lives with her husband   Prior smoker not current 3 caffeinated beverages a day no alcohol tobacco or drug use      2 independent teachers    Pharmacy schools, music teacher    Past Surgical History:  Procedure Laterality Date  . CESAREAN SECTION    . ENDOMETRIAL ABLATION    . GASTRIC BYPASS  2001  . SBO  2015  . TUBAL LIGATION      Family History  Problem Relation Age of Onset  . Diabetes Mother   . Cancer Mother 60       Throat   . Bipolar disorder Mother   . Bipolar disorder Sister   . Bipolar disorder Brother   . Drug abuse  Brother   . Bipolar disorder Sister   . Colon cancer Neg Hx   . Cancer - Prostate Neg Hx   . Breast cancer Neg Hx     Allergies  Allergen Reactions  . Nsaids Other (See Comments)    Contraindicated with gastric bypass    Current Outpatient Medications on File Prior to Visit  Medication Sig Dispense Refill  . budesonide-formoterol (SYMBICORT) 160-4.5 MCG/ACT inhaler Inhale 2 puffs into the lungs 2 (two) times daily. 1 Inhaler 3  . diazepam (VALIUM) 5 MG tablet Take 1 tablet (5 mg total) by mouth 2 (two) times daily as needed for anxiety. 60 tablet 2  . divalproex (DEPAKOTE ER) 500 MG 24 hr tablet Take 4 tablets (2,000 mg total) by mouth at bedtime. 360 tablet 0  . doxycycline (VIBRA-TABS) 100 MG tablet Take 1 tablet (100 mg total) by mouth 2 (two) times daily. 15 tablet 0  . FLUoxetine (PROZAC) 20 MG tablet Take 4 tablets (80 mg total) by mouth daily. 360 tablet 0  . gabapentin (NEURONTIN) 100 MG capsule Take 1 capsule (100 mg total) by mouth 3 (three) times daily. 60 capsule 0  . lamoTRIgine (LAMICTAL) 25 MG tablet Take 1 tablet (25 mg total) by mouth daily. 90 tablet 0  . linaclotide (LINZESS) 290 MCG CAPS capsule Take 1 capsule (290 mcg total) by mouth daily before breakfast. 30 capsule 3  . Prucalopride Succinate (MOTEGRITY) 2 MG TABS Take 2 mg by mouth daily. Samples of this drug were given to the patient, Lot Number [id number] 7 tablet 0  . QUEtiapine (SEROQUEL) 100 MG tablet Take 1 tablet (100 mg total) by mouth 2 (two) times daily. 180 tablet 0  . QUEtiapine (SEROQUEL) 200 MG tablet Take 1 tablet (200 mg total) by mouth at bedtime. 90 tablet 0  . SAXENDA 18 MG/3ML SOPN   0  . traZODone (DESYREL) 50 MG tablet 25-50 mg at night as needed for sleep 30 tablet 2  . varenicline (CHANTIX CONTINUING MONTH PAK) 1 MG tablet Take 1 tablet (1 mg total) by mouth 2 (two) times daily. 60 tablet 2  . [START ON 01/28/2018] zolpidem (AMBIEN) 5 MG tablet Take 1 tablet (5 mg total) by mouth at bedtime.  30 tablet 2   Current Facility-Administered Medications on File Prior to Visit  Medication Dose Route Frequency Provider Last Rate Last Dose  . 0.9 %  sodium chloride infusion  500 mL Intravenous Once Stan HeadGessner, Carl  E, MD        BP 104/76   Pulse 89   Temp 98 F (36.7 C) (Oral)   Resp 16   Ht 5\' 4"  (1.626 m)   Wt 226 lb 9.6 oz (102.8 kg)   SpO2 95%   BMI 38.90 kg/m       Objective:   Physical Exam  General Mental Status- Alert. General Appearance- Not in acute distress.   Skin Left side anterior thorax lower rib aspect all the way around to posterior side scattered dermatomal rash extending to mid tspine(rash does not cross midline). No vesicles or scabs seen.   Around left eye- no rash consistent with shingles.  Neck Carotid Arteries- Normal color. Moisture- Normal Moisture. No carotid bruits. No JVD.  Chest and Lung Exam Auscultation: Breath Sounds:-Normal.  Cardiovascular Auscultation:Rythm- Regular. Murmurs & Other Heart Sounds:Auscultation of the heart reveals- No Murmurs.  Abdomen Inspection:-Inspeection Normal. Palpation/Percussion:Note:No mass. Palpation and Percussion of the abdomen reveal- Non Tender, Non Distended + BS, no rebound or guarding.   Neurologic Cranial Nerve exam:- CN III-XII intact(No nystagmus), symmetric smile. Strength:- 5/5 equal and symmetric strength both upper and lower extremities.   Eyes- perrl bilateral.Both eyes conjunctiva clear. Left upper lid mid aspect edge of lash small stye.       Assessment & Plan:  For your shingles outbreak, I am refilling your antiviral, continue gabapentin and making norco available for severe pain.(limited number norco)   Work note to be off at least 7 more days. Possibly longer based on your occupation.  For small stye left upper eye lid do warm compress twice a day. I don't think you have shingles around your eye. But if you were to get vesicles around eye or rash let me know  immediately.  Follow up in next week or as needed  [employer], PA-C

## 2018-01-01 NOTE — Telephone Encounter (Signed)
Pt has to return back to work tomorrow and would like to come into office today for a hospital f/u please call pt [phone]

## 2018-01-01 NOTE — Telephone Encounter (Addendum)
Patient scheduled for today with Ashley RichtersEdward Saguier PA. for ED follow up.

## 2018-01-07 ENCOUNTER — Encounter: Payer: Self-pay | Admitting: Internal Medicine

## 2018-01-07 ENCOUNTER — Ambulatory Visit (INDEPENDENT_AMBULATORY_CARE_PROVIDER_SITE_OTHER): Payer: No Typology Code available for payment source | Admitting: Internal Medicine

## 2018-01-07 VITALS — BP 100/68 | HR 68 | Temp 97.7°F | Resp 16 | Ht 64.0 in | Wt 223.8 lb

## 2018-01-07 DIAGNOSIS — Z780 Asymptomatic menopausal state: Secondary | ICD-10-CM

## 2018-01-07 DIAGNOSIS — B0229 Other postherpetic nervous system involvement: Secondary | ICD-10-CM

## 2018-01-07 DIAGNOSIS — Z01419 Encounter for gynecological examination (general) (routine) without abnormal findings: Secondary | ICD-10-CM

## 2018-01-07 DIAGNOSIS — F3113 Bipolar disorder, current episode manic without psychotic features, severe: Secondary | ICD-10-CM | POA: Diagnosis not present

## 2018-01-07 MED ORDER — HYDROCODONE-ACETAMINOPHEN 5-325 MG PO TABS: 1.0000 | ORAL_TABLET | Freq: Four times a day (QID) | ORAL | 0 refills | Status: DC | PRN

## 2018-01-07 MED FILL — HYDROCODON-APAP 5-325: 5-325 | 5 days supply | Qty: 20 | Fill #0

## 2018-01-07 MED FILL — valACYclovir HCL 1 GM TABS: 1 | 7 days supply | Qty: 21 | Fill #0

## 2018-01-07 NOTE — Progress Notes (Signed)
Subjective:    Patient ID: Ashley Franklin, female    DOB: 1971/05/09, 47 y.o.   MRN: [id number]  DOS:  01/07/2018 Type of visit - description : Follow-up, multiple issues Interval history: Was seen by me 11/21/2017, she had cough, shortness of breath and night sweats. Work-up reviewed and discussed with patient today  Went to the ER 12/28/2017 with left lateral chest pain, work-up included a chest x-ray was normal, negative d-dimer, CT chest without acute abnormalities, labs were unremarkable except for a calcium of 8.8, slightly low. Eventually was discharged home with gabapentin and Valtrex as the pain was neuropathic.    4 days later she presented to this office with a rash consistent with shingles.  She was prescribed hydrocodone. Still hurting significantly   Review of Systems Denies nausea or vomiting. Constipation continues to be well controlled   Past Medical History:  Diagnosis Date  . Anemia   . Anxiety   . Bipolar affective disorder (HCC)   . Blood transfusion without reported diagnosis   . Bronchospasm with bronchitis, acute   . Depression   . Kidney stone   . SBO (small bowel obstruction) (HCC) 01/2014    Past Surgical History:  Procedure Laterality Date  . CESAREAN SECTION    . ENDOMETRIAL ABLATION    . GASTRIC BYPASS  2001  . SBO  2015  . TUBAL LIGATION      Social History   Socioeconomic History  . Marital status: Married    Spouse name: Not on file  . Number of children: 2  . Years of education: Not on file  . Highest education level: Not on file  Occupational History  . Occupation: phelbotomist    Employer: [employer]  Social Needs  . Financial resource strain: Not on file  . Food insecurity:    Worry: Not on file    Inability: Not on file  . Transportation needs:    Medical: Not on file    Non-medical: Not on file  Tobacco Use  . Smoking status: Former Smoker    Packs/day: 0.50    Years: 6.00    Pack years: 3.00    Last attempt to  quit: 05/11/2014    Years since quitting: 3.6  . Smokeless tobacco: Never Used  . Tobacco comment: 1/2 ppd   Substance and Sexual Activity  . Alcohol use: No    Alcohol/week: 0.0 standard drinks  . Drug use: No  . Sexual activity: Yes    Partners: Male    Birth control/protection: Surgical  Lifestyle  . Physical activity:    Days per week: Not on file    Minutes per session: Not on file  . Stress: Not on file  Relationships  . Social connections:    Talks on phone: Not on file    Gets together: Not on file    Attends religious service: Not on file    Active member of club or organization: Not on file    Attends meetings of clubs or organizations: Not on file    Relationship status: Not on file  . Intimate partner violence:    Fear of current or ex partner: Not on file    Emotionally abused: Not on file    Physically abused: Not on file    Forced sexual activity: Not on file  Other Topics Concern  . 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 Always use your most recent med list.          budesonide-formoterol 160-4.5 MCG/ACT inhaler Commonly known as:  SYMBICORT Inhale 2 puffs into the lungs 2 (two) times daily.   diazepam 5 MG tablet Commonly known as:  VALIUM Take 1 tablet (5 mg total) by mouth 2 (two) times daily as needed for anxiety.   divalproex 500 MG 24 hr tablet Commonly known as:  DEPAKOTE ER Take 4 tablets (2,000 mg total) by mouth at bedtime.   FLUoxetine 20 MG tablet Commonly known as:   PROZAC Take 4 tablets (80 mg total) by mouth daily.   gabapentin 100 MG capsule Commonly known as:  NEURONTIN Take 1 capsule (100 mg total) by mouth 3 (three) times daily.   HYDROcodone-acetaminophen 5-325 MG tablet Commonly known as:  NORCO/VICODIN Take 1 tablet by mouth every 6 (six) hours as needed for moderate pain.   lamoTRIgine 25 MG tablet Commonly known as:  LAMICTAL Take 1 tablet (25 mg total) by mouth daily.   linaclotide 290 MCG Caps capsule Commonly known as:  LINZESS Take 1 capsule (290 mcg total) by mouth daily before breakfast.   QUEtiapine 100 MG tablet Commonly known as:  SEROQUEL Take 1 tablet (100 mg total) by mouth 2 (two) times daily.   QUEtiapine 200 MG tablet Commonly known as:  SEROQUEL Take 1 tablet (200 mg total) by mouth at bedtime.   SAXENDA 18 MG/3ML Sopn Generic drug:  Liraglutide -Weight Management   traZODone 50 MG tablet Commonly known as:  DESYREL 25-50 mg at night as needed for sleep   zolpidem 5 MG tablet Commonly known as:  AMBIEN Take 1 tablet (5 mg total) by mouth at bedtime. Start taking on:  01/28/2018          Objective:   Physical Exam  Skin:      BP 100/68 (BP Location: Left Arm, Patient Position: Sitting, Cuff Size: Large)   Pulse 68   Temp 97.7 F (36.5 C) (Oral)   Resp 16   Ht 5\' 4"  (1.626 m)   Wt 223 lb 12.8 oz (101.5 kg)   SpO2 97%   BMI 38.42 kg/m  General:   Well developed, NAD, see BMI.  HEENT:  Normocephalic . Face symmetric, atraumatic Lungs:  CTA B Normal respiratory effort, no intercostal retractions, no accessory muscle use. Heart: RRR,  no murmur.  No pretibial edema bilaterally  Skin: see graphic neurologic:  alert & oriented X3.  Speech normal, gait appropriate for age and unassisted Psych--  Cognition and judgment appear intact.  Cooperative with normal attention span and concentration.  Behavior appropriate. No anxious or depressed appearing.      Assessment & Plan:    Assesment Bipolar, anxiety: per psych Dr Vanetta Shawl H/o kidney stones H/o SBO ~ 2015  Plan: Cough, shortness of breath: Was seen 11/21/2017, multiple symptoms, cough, wheezing, night sweats, etc. Was treated with Symbicort and doxycycline. Cough has significantly  decreased.  Not completely gone.  Plan is to continue with Symbicort, continue staying away from tobacco and reassess on RTC Night sweats: Work-up w/ a FSH and LH consistent with menopause.  Needs to see a gynecologist, will arrange a referral Abnormal chest x-ray: See results from 11/21/2017, subsequently went to the ER for an unrelated issue and a chest x-ray and CT chest were negative. Shingles: As described at the HPI.  She is a status post Valtrex x10 days, no need for further Valtrex. Currently using OTC lidocaine spray with relatively good results and some Vicodin.  Recommend pain management by lidocaine, Tylenol and use Vicodin only sporadically.  She took gabapentin without any major benefit.  Will stop it.  Needs a work excuse due to shingles pain, will write anote to RTC by  September 3  Bipolar: Per psychiatry RTC 3 months  F2F> 25 d/t chart review, answering questions from pt and husband

## 2018-01-07 NOTE — Patient Instructions (Signed)
  GO TO THE FRONT DESK Schedule your next appointment for a checkup in 3 months  Pain: --Lidocaine spray --Tylenol  500 mg OTC 2 tabs a day every 8 hours as needed for pain --Also take Vicodin sporadically.  It has Tylenol on it, do not mix those medicines.

## 2018-01-08 DIAGNOSIS — Z8619 Personal history of other infectious and parasitic diseases: Secondary | ICD-10-CM | POA: Insufficient documentation

## 2018-01-08 DIAGNOSIS — Z780 Asymptomatic menopausal state: Secondary | ICD-10-CM | POA: Insufficient documentation

## 2018-01-08 NOTE — Assessment & Plan Note (Signed)
Cough, shortness of breath: Was seen 11/21/2017, multiple symptoms, cough, wheezing, night sweats, etc. Was treated with Symbicort and doxycycline. Cough has significantly decreased.  Not completely gone.  Plan is to continue with Symbicort, continue staying away from tobacco and reassess on RTC Night sweats: Work-up w/ a FSH and LH consistent with menopause.  Needs to see a gynecologist, will arrange a referral Abnormal chest x-ray: See results from 11/21/2017, subsequently went to the ER for an unrelated issue and a chest x-ray and CT chest were negative. Shingles: As described at the HPI.  She is a status post Valtrex x10 days, no need for further Valtrex. Currently using OTC lidocaine spray with relatively good results and some Vicodin.  Recommend pain management by lidocaine, Tylenol and use Vicodin only sporadically.  She took gabapentin without any major benefit.  Will stop it.  Needs a work excuse due to shingles pain, will write anote to RTC by  September 3  Bipolar: Per psychiatry RTC 3 months

## 2018-01-08 NOTE — Telephone Encounter (Signed)
Copied from CRM [phone]#[id number]. Topic: Quick Communication - See Telephone Encounter >> Jan 08, 2018  2:31 PM Floria RavelingStovall, Shana A wrote: CRM for notification. See Telephone encounter for: 01/08/18.  Pt called in and state that she was in yesterday to fu on shingles.  She stated that her employer ([employer]) should be faxing over FMLA paperwork from he.  She has been out of work for 2 weeks and she has started a IT trainerMLA claim.  Have the office rec'd this paperwork  Best number  [phone]

## 2018-01-12 LAB — COMPREHENSIVE METABOLIC PANEL
A/G RATIO: 1.4 (ref 1.2–2.2)
ALK PHOS: 61 IU/L (ref 39–117)
ALT: 13 IU/L (ref 0–32)
AST: 17 IU/L (ref 0–40)
Albumin: 3.7 g/dL (ref 3.5–5.5)
BUN/Creatinine Ratio: 22 (ref 9–23)
BUN: 18 mg/dL (ref 6–24)
CALCIUM: 9.3 mg/dL (ref 8.7–10.2)
CHLORIDE: 102 mmol/L (ref 96–106)
CO2: 26 mmol/L (ref 20–29)
Creatinine, Ser: 0.81 mg/dL (ref 0.57–1.00)
GFR calc Af Amer: 101 mL/min/{1.73_m2} (ref >59)
GFR calc non Af Amer: 88 mL/min/{1.73_m2} (ref >59)
GLOBULIN, TOTAL: 2.6 g/dL (ref 1.5–4.5)
Glucose: 82 mg/dL (ref 65–99)
POTASSIUM: 4.7 mmol/L (ref 3.5–5.2)
SODIUM: 143 mmol/L (ref 134–144)
Total Protein: 6.3 g/dL (ref 6.0–8.5)

## 2018-01-12 LAB — CBC
HEMOGLOBIN: 13.3 g/dL (ref 11.1–15.9)
Hematocrit: 40.7 % (ref 34.0–46.6)
MCH: 30.1 pg (ref 26.6–33.0)
MCHC: 32.7 g/dL (ref 31.5–35.7)
MCV: 92 fL (ref 79–97)
PLATELETS: 182 10*3/uL (ref 150–450)
RBC: 4.42 x10E6/uL (ref 3.77–5.28)
RDW: 12.8 % (ref 12.3–15.4)
WBC: 6.2 10*3/uL (ref 3.4–10.8)

## 2018-01-12 LAB — VALPROIC ACID LEVEL: Valproic Acid Lvl: 107 ug/mL — ABNORMAL HIGH (ref 50–100)

## 2018-01-15 MED FILL — PENICILLIN VK 500 MG TABLET: 500 | 5 days supply | Qty: 20 | Fill #0

## 2018-01-16 ENCOUNTER — Encounter: Payer: Self-pay | Admitting: Internal Medicine

## 2018-01-16 ENCOUNTER — Telehealth: Payer: Self-pay | Admitting: *Deleted

## 2018-01-16 NOTE — Telephone Encounter (Signed)
Received completed FMLA/STD paperwork from [employer] returned from provider's folder, [was on vacation last week]; forwarded via fax to [employer]/SLS 09/04

## 2018-01-21 MED FILL — SYMBICORT 160-4.5 MCG INH: 160-4.5 | 30 days supply | Qty: 10 | Fill #1

## 2018-01-25 MED FILL — FLUoxetine HCL 20 MG CAPS: 20 | 90 days supply | Qty: 360 | Fill #0

## 2018-01-25 MED FILL — LINZESS 290 MCG CAPSULE: 290 | 30 days supply | Qty: 30 | Fill #1

## 2018-01-28 MED FILL — SAXENDA 18 MG/3 ML PEN: 18 | 30 days supply | Qty: 15 | Fill #0

## 2018-01-28 MED FILL — ZOLPIDEM TARTRATE 5 MG TAB: 5 | 30 days supply | Qty: 30 | Fill #0

## 2018-01-28 NOTE — Telephone Encounter (Signed)
Patient calling and states that she would like an update on the [facility]. States that if the paperwork isn't completed within the  16 calendar days of the letter (01/18/18) then they wil automatically deny her claim. Please advise. Patient would like an update.   Claim #: [id number] Fax#: [phone]

## 2018-01-30 NOTE — Telephone Encounter (Signed)
Pt calling back to check status on her FMLA paperwork being completed. Please advise. She is requesting a call back asap as the forms need to be sent in within 16 calendar days as of 01/18/18 or the claim will be denied.

## 2018-01-30 NOTE — Telephone Encounter (Signed)
Spoke with [employer], they are unable to release the paper back to us. They are only allowed to release information to the patient themselves. Unless we cannot locate the paperwork in office. Patient will have to obtain a copy to fax to [employer].

## 2018-01-30 NOTE — Telephone Encounter (Signed)
Pts husband calling and states that the STD paperwork was suppose to be sent to [employer] instead of [employer]. Spoke with someone at the office and she stated that the paperwork may have already been sent to the scan center and that she will call [employer] to see if they will fax the STD paperwork to [facility] for the pt since all paperwork was faxed to them on 01/16/18. Husband states he will also see if his wife has a copy of this paperwork at home and if so he will bring it by the office to be faxed before 5pm today. Once this [facility] is [facility] or the call has been made to [employer] and they will fax please call pt to inform her. This paperwork needs to be in to [employer] within the next 4 days or her claim will declined and pt will lose $1200 for the time she was out of work per pt and husband.

## 2018-01-31 NOTE — Telephone Encounter (Signed)
The paperwork that I forwarded to Dr. Salley ScarletPaz [before I went on vacation from 01/05/18 to 01/14/18] was for [employer]; I have the original paperwork, as it had not been faxed when I returned to office and I retrieved completed paperwork and faxed it to [employer] with confirmation on 01/16/18 [I keep for a period in case anything else is needed]. I have not received any other paperwork [from [employer] or other source] and there is no documentation that any other paperwork was received during the period that I was out of the office. I am sorry but I cannot answer if any other paperwork came in that week or as to its whereabouts/SLS 09/19

## 2018-01-31 NOTE — Telephone Encounter (Signed)
Kim spoke with [employer] and we downloaded forms off of their website; they also faxed some others over requesting Office visit notes with imaging, treatment plan,etc. Completed all paperwork and attached both OV notes [with [employer]], had provider sign and then faxed back to [employer]. Selena BattenKim has spoken to the patient [and husband about the paperwork that was in question]/SLS 09/19

## 2018-02-04 ENCOUNTER — Encounter: Payer: Self-pay | Admitting: Family Medicine

## 2018-02-04 ENCOUNTER — Ambulatory Visit (INDEPENDENT_AMBULATORY_CARE_PROVIDER_SITE_OTHER): Payer: No Typology Code available for payment source | Admitting: Family Medicine

## 2018-02-04 VITALS — BP 106/65 | HR 81 | Ht 64.0 in | Wt 228.1 lb

## 2018-02-04 DIAGNOSIS — Z01419 Encounter for gynecological examination (general) (routine) without abnormal findings: Secondary | ICD-10-CM

## 2018-02-04 DIAGNOSIS — Z124 Encounter for screening for malignant neoplasm of cervix: Secondary | ICD-10-CM

## 2018-02-04 DIAGNOSIS — Z1151 Encounter for screening for human papillomavirus (HPV): Secondary | ICD-10-CM

## 2018-02-04 MED FILL — diazePAM 5 MG TABS: 5 | 30 days supply | Qty: 60 | Fill #1

## 2018-02-04 NOTE — Progress Notes (Signed)
GYNECOLOGY ANNUAL PREVENTATIVE CARE ENCOUNTER NOTE  Subjective:   Ashley Franklin is a 47 y.o. [phone] female here for a routine annual gynecologic exam.  Current complaints: none.   Denies abnormal vaginal bleeding, discharge, pelvic pain, problems with intercourse or other gynecologic concerns.    H/o ablation.  Gynecologic History No LMP recorded. Patient has had a hysterectomy. Patient is sexually active  Contraception: tubal ligation Last Pap: 2013. Results were: normal Last mammogram: n/a. Results were: normal  Obstetric History OB History  Gravida Para Term Preterm AB Living  2 2 2     2   SAB TAB Ectopic Multiple Live Births          2    # Outcome Date GA Lbr Len/2nd Weight Sex Delivery Anes PTL Lv  2 Term 1995 [redacted]w[redacted]d   F  EPI  LIV  1 Term 1994 [redacted]w[redacted]d   M CS-LTranv EPI  LIV    Past Medical History:  Diagnosis Date  . Anemia   . Anxiety   . Bipolar affective disorder (HCC)   . Blood transfusion without reported diagnosis   . Bronchospasm with bronchitis, acute   . Depression   . Kidney stone   . SBO (small bowel obstruction) (HCC) 01/2014    Past Surgical History:  Procedure Laterality Date  . CESAREAN SECTION    . ENDOMETRIAL ABLATION    . GASTRIC BYPASS  2001  . SBO  2015  . TUBAL LIGATION      Current Outpatient Medications on File Prior to Visit  Medication Sig Dispense Refill  . budesonide-formoterol (SYMBICORT) 160-4.5 MCG/ACT inhaler Inhale 2 puffs into the lungs 2 (two) times daily. 1 Inhaler 3  . diazepam (VALIUM) 5 MG tablet Take 1 tablet (5 mg total) by mouth 2 (two) times daily as needed for anxiety. 60 tablet 2  . divalproex (DEPAKOTE ER) 500 MG 24 hr tablet Take 4 tablets (2,000 mg total) by mouth at bedtime. 360 tablet 0  . lamoTRIgine (LAMICTAL) 25 MG tablet Take 1 tablet (25 mg total) by mouth daily. 90 tablet 0  . linaclotide (LINZESS) 290 MCG CAPS capsule Take 1 capsule (290 mcg total) by mouth daily before breakfast. 30 capsule 3  .  QUEtiapine (SEROQUEL) 100 MG tablet Take 1 tablet (100 mg total) by mouth 2 (two) times daily. 180 tablet 0  . QUEtiapine (SEROQUEL) 200 MG tablet Take 1 tablet (200 mg total) by mouth at bedtime. 90 tablet 0  . SAXENDA 18 MG/3ML SOPN   0  . traZODone (DESYREL) 50 MG tablet 25-50 mg at night as needed for sleep 30 tablet 2  . zolpidem (AMBIEN) 5 MG tablet Take 1 tablet (5 mg total) by mouth at bedtime. 30 tablet 2  . FLUoxetine (PROZAC) 20 MG tablet Take 4 tablets (80 mg total) by mouth daily. (Patient not taking: Reported on 02/04/2018) 360 tablet 0  . gabapentin (NEURONTIN) 100 MG capsule Take 1 capsule (100 mg total) by mouth 3 (three) times daily. (Patient not taking: Reported on 02/04/2018) 60 capsule 0  . HYDROcodone-acetaminophen (NORCO) 5-325 MG tablet Take 1 tablet by mouth every 6 (six) hours as needed for moderate pain. (Patient not taking: Reported on 02/04/2018) 20 tablet 0   Current Facility-Administered Medications on File Prior to Visit  Medication Dose Route Frequency Provider Last Rate Last Dose  . 0.9 %  sodium chloride infusion  500 mL Intravenous Once Iva Boop, MD        Allergies  Allergen Reactions  . Nsaids Other (See Comments)    Contraindicated with gastric bypass    Social History   Socioeconomic History  . Marital status: Married    Spouse name: Not on file  . Number of children: 2  . Years of education: Not on file  . Highest education level: Not on file  Occupational History  . Occupation: phelbotomist    Employer: [employer]  Social Needs  . Financial resource strain: Not on file  . Food insecurity:    Worry: Not on file    Inability: Not on file  . Transportation needs:    Medical: Not on file    Non-medical: Not on file  Tobacco Use  . Smoking status: Former Smoker    Packs/day: 0.50    Years: 6.00    Pack years: 3.00    Last attempt to quit: 05/11/2014    Years since quitting: 3.7  . Smokeless tobacco: Never Used  . Tobacco comment:  1/2 ppd   Substance and Sexual Activity  . Alcohol use: No    Alcohol/week: 0.0 standard drinks  . Drug use: No  . Sexual activity: Yes    Partners: Male    Birth control/protection: Surgical  Lifestyle  . Physical activity:    Days per week: Not on file    Minutes per session: Not on file  . Stress: Not on file  Relationships  . Social connections:    Talks on phone: Not on file    Gets together: Not on file    Attends religious service: Not on file    Active member of club or organization: Not on file    Attends meetings of clubs or organizations: Not on file    Relationship status: Not on file  . Intimate partner violence:    Fear of current or ex partner: Not on file    Emotionally abused: Not on file    Physically abused: Not on file    Forced sexual activity: Not on file  Other Topics Concern  . 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 Diabetes Mother   . Cancer Mother 60       Throat   . Bipolar disorder Mother   . Bipolar disorder Sister   . Bipolar disorder Brother   . Drug abuse Brother   . Bipolar disorder Sister   . Colon cancer Neg Hx   . Cancer - Prostate Neg Hx   . Breast cancer Neg Hx     The following portions of the patient's history were reviewed and updated as appropriate: allergies, current medications, past family history, past medical history, past social history, past surgical history and problem list.  Review of Systems Pertinent items noted in HPI and remainder of comprehensive ROS otherwise negative.   Objective:  BP 106/65   Pulse 81   Ht 5\' 4"  (1.626 m)   Wt 228 lb 1.3 oz (103.5 kg)   BMI 39.15  kg/m  CONSTITUTIONAL: Well-developed, well-nourished female in no acute distress.  HENT:  Normocephalic, atraumatic, External right  and left ear normal. Oropharynx is clear and moist EYES: Conjunctivae and EOM are normal. Pupils are equal, round, and reactive to light. No scleral icterus.  NECK: Normal range of motion, supple, no masses.  Normal thyroid.   CARDIOVASCULAR: Normal heart rate noted, regular rhythm RESPIRATORY: Clear to auscultation bilaterally. Effort and breath sounds normal, no problems with respiration noted. BREASTS: Symmetric in size. No masses, skin changes, nipple drainage, or lymphadenopathy. ABDOMEN: Soft, normal bowel sounds, no distention noted.  No tenderness, rebound or guarding.  PELVIC: Normal appearing external genitalia; normal appearing vaginal mucosa and cervix.  No abnormal discharge noted.  Pap smear obtained.  Normal uterine size, no other palpable masses, no uterine or adnexal tenderness. MUSCULOSKELETAL: Normal range of motion. No tenderness.  No cyanosis, clubbing, or edema.  2+ distal pulses. SKIN: Skin is warm and dry. No rash noted. Not diaphoretic. No erythema. No pallor. NEUROLOGIC: Alert and oriented to person, place, and time. Normal reflexes, muscle tone coordination. No cranial nerve deficit noted. PSYCHIATRIC: Normal mood and affect. Normal behavior. Normal judgment and thought content.  Assessment:  Annual gynecologic examination with pap smear   Plan:  1. Well Woman Exam Will follow up results of pap smear and manage accordingly. Mammogram scheduled STD testing discussed. Patient declined testing - MM DIGITAL SCREENING BILATERAL; Future   Routine preventative health maintenance measures emphasized. Please refer to After Visit Summary for other counseling recommendations.    Candelaria CelesteJacob Aimie Wagman, DO [facility]

## 2018-02-04 NOTE — Patient Instructions (Signed)

## 2018-02-04 NOTE — Addendum Note (Signed)
Addended by: Anell BarrHOWARD, Azlee L on: 02/04/2018 10:31 AM   Modules accepted: Orders

## 2018-02-06 LAB — CYTOLOGY - PAP
Diagnosis: NEGATIVE
HPV: NOT DETECTED

## 2018-02-12 ENCOUNTER — Ambulatory Visit (HOSPITAL_BASED_OUTPATIENT_CLINIC_OR_DEPARTMENT_OTHER): Payer: Self-pay

## 2018-02-18 ENCOUNTER — Other Ambulatory Visit: Payer: Self-pay | Admitting: Occupational Medicine

## 2018-02-18 ENCOUNTER — Ambulatory Visit (HOSPITAL_BASED_OUTPATIENT_CLINIC_OR_DEPARTMENT_OTHER)
Admission: RE | Admit: 2018-02-18 | Discharge: 2018-02-18 | Disposition: A | Discharge status: Home / Self Care | Payer: No Typology Code available for payment source | Source: Ambulatory Visit | Attending: Family Medicine | Admitting: Family Medicine

## 2018-02-18 ENCOUNTER — Encounter (HOSPITAL_BASED_OUTPATIENT_CLINIC_OR_DEPARTMENT_OTHER): Payer: Self-pay

## 2018-02-18 ENCOUNTER — Ambulatory Visit: Payer: Self-pay

## 2018-02-18 DIAGNOSIS — M25522 Pain in left elbow: Secondary | ICD-10-CM

## 2018-02-18 DIAGNOSIS — Z01419 Encounter for gynecological examination (general) (routine) without abnormal findings: Secondary | ICD-10-CM | POA: Diagnosis not present

## 2018-02-25 MED FILL — ZOLPIDEM TARTRATE 5 MG TAB: 5 | 30 days supply | Qty: 30 | Fill #1

## 2018-02-25 MED FILL — QUETIAPINE FUMARATE 100 MG: 100 | 90 days supply | Qty: 180 | Fill #0

## 2018-02-26 ENCOUNTER — Ambulatory Visit: Payer: PRIVATE HEALTH INSURANCE | Attending: Family Medicine | Admitting: Physical Therapy

## 2018-02-26 ENCOUNTER — Encounter: Payer: Self-pay | Admitting: Physical Therapy

## 2018-02-26 DIAGNOSIS — M25522 Pain in left elbow: Secondary | ICD-10-CM | POA: Insufficient documentation

## 2018-02-26 DIAGNOSIS — S5002XD Contusion of left elbow, subsequent encounter: Secondary | ICD-10-CM | POA: Diagnosis present

## 2018-02-26 DIAGNOSIS — M6281 Muscle weakness (generalized): Secondary | ICD-10-CM | POA: Insufficient documentation

## 2018-02-26 NOTE — Therapy (Signed)
[facility] Outpatient Rehabilitation [facility] [address]  [address] [place], [place], [address] Phone: [phone]   Fax:  [phone]  Physical Therapy Evaluation  Patient Details  Name: Ashley Franklin MRN: [id number] Date of Birth: [birth year] Referring Provider (PT): MD Lanell Persons   Encounter Date: 02/26/2018  PT End of Session - 02/26/18 1002    Visit Number  1    Number of Visits  8   1 eval plus 7 PT visits   Date for PT Re-Evaluation  04/02/18    Authorization Type  WC, Case Manager [employer] phone [phone], Fax [phone]    Authorization - Visit Number  0    Authorization - Number of Visits  7    PT Start Time  0805    PT Stop Time  0845    PT Time Calculation (min)  40 min    Activity Tolerance  Patient tolerated treatment well    Behavior During Therapy  [facility] for tasks assessed/performed       Past Medical History:  Diagnosis Date  . Anemia   . Anxiety   . Bipolar affective disorder (HCC)   . Blood transfusion without reported diagnosis   . Bronchospasm with bronchitis, acute   . Depression   . Kidney stone   . SBO (small bowel obstruction) (HCC) 01/2014    Past Surgical History:  Procedure Laterality Date  . CESAREAN SECTION    . ENDOMETRIAL ABLATION    . GASTRIC BYPASS  2001  . SBO  2015  . TUBAL LIGATION      There were no vitals filed for this visit.   Subjective Assessment - 02/26/18 0926    Subjective  Pt relays she fell at work on 02/01/18. She relays she slipped on piece of film paper or plastic in a patients room causing her to fall and land on her Lt knee and elbow. She relays her left knee is now good, but her Lt elbow continues to be in pain, making it difficult for her to use her left arm. This is a workers Education officer, environmental and The patient is a Water quality scientist at [employer]. She relays she is not on work restrictions and is performing her usual job duties but she has to push through the pain. She was referred to  PT by MD Lanell Persons for Left elbow contusion and ulnar nerve irriation.  She had Lt elbow x-ray which was neg on 02/18/18.    Pertinent History  bipolar disorder, Previous Lt ankle injury    Limitations  Lifting    Diagnostic tests  x-ray neg    Patient Stated Goals  get rid of the pain and use my arm more    Currently in Pain?  Yes    Pain Score  7     Pain Location  Elbow    Pain Orientation  Left    Pain Descriptors / Indicators  Shooting;Burning    Pain Type  Acute pain    Pain Radiating Towards  denies    Pain Onset  More than a month ago    Pain Frequency  Intermittent    Aggravating Factors   gripping, lifting, carrying, pulling doors    Pain Relieving Factors  rest    Multiple Pain Sites  No         [facility] PT Assessment - 02/26/18 0001      Assessment   Medical Diagnosis  Lt elbow pain, ulnar nerve irritation  Referring Provider (PT)  MD Lanell Persons    Onset Date/Surgical Date  --   02/01/18?   Hand Dominance  Right    Next MD Visit  3-4 weeks    Prior Therapy  PT for ankle      Precautions   Precautions  None      Balance Screen   Has the patient fallen in the past 6 months  Yes    How many times?  1   fall at work   Has the patient had a decrease in activity level because of a fear of falling?   No    Is the patient reluctant to leave their home because of a fear of falling?   No      Home Public house manager residence      Prior Function   Level of Independence  Independent    Vocation  Full time employment    Sales executive at [employer]      Cognition   Overall Cognitive Status  Within Functional Limits for tasks assessed      Observation/Other Assessments   Focus on Therapeutic Outcomes (FOTO)   62% limited      Sensation   Light Touch  Appears Intact      ROM / Strength   AROM / PROM / Strength  AROM;PROM;Strength      AROM   AROM Assessment Site  Elbow    Right/Left Elbow  Left     Left Elbow Flexion  90   limited by pain, WNL PROM   Left Elbow Extension  0      PROM   Overall PROM   Within functional limits for tasks performed      Strength   Overall Strength Comments  wrist overall 4/5 MMT all planes, Grip strength Rt 55 lbs, Lt 5 lbs    Strength Assessment Site  Elbow    Right/Left Elbow  Left    Left Elbow Flexion  4/5    Left Elbow Extension  4/5      Palpation   Palpation comment  TTP localized to Lt elbow medial and lateral condyles      Special Tests   Other special tests  Pain with resisted biceps/triceps, wrist ext, may suggest tendonitis                Objective measurements completed on examination: See above findings.      [facility] Adult PT Treatment/Exercise - 02/26/18 0001      Modalities   Modalities  Cryotherapy;Electrical Stimulation      Cryotherapy   Number Minutes Cryotherapy  15 Minutes    Cryotherapy Location  --   Lt elbow   Type of Cryotherapy  Ice pack      Electrical Stimulation   Electrical Stimulation Location  Lt elbow    Electrical Stimulation Action  IFC    Electrical Stimulation Parameters  tolerance    Electrical Stimulation Goals  Pain             PT Education - 02/26/18 1001    Education Details  HEP, POC, TENS, ICE    Person(s) Educated  Patient    Methods  Explanation;Demonstration;Verbal cues;Handout    Comprehension  Verbalized understanding;Need further instruction          PT Long Term Goals - 02/26/18 1007      PT LONG TERM GOAL #1   Title  Independent w/ ongoing  HEP. 4 weeks 04/02/18.    Status  New      PT LONG TERM GOAL #2   Title  Pt. will demonstrate an increase in Lt Elbow ROM to WNL to increase function. 4 weeks 04/02/18.    Status  New      PT LONG TERM GOAL #3   Title  Pt. will demonstrate increase in L elbow/wrist strength to 5/5 and Lt grip strength to >40 lbs to allow for increased elbow stability and functional use of arm. 4 weeks 04/02/18.    Status  New       PT LONG TERM GOAL #4   Title  Pt will improve FOTO to less than 40 % limited. 4 weeks 04/02/18.    Status  New      PT LONG TERM GOAL #5   Title  Pt will be able to perform her ususal ADL's and work duties with less than 2/10 overall pain. 4 weeks 06/02/17             Plan - 02/26/18 1003    Clinical Impression Statement  Pt presents with Lt elbow pain and tendonitis, possible ular nerve irritation. She has decreased elbow ROM, decreased UE strength and grip strength, increased pain and decreased functional use for grasping, carrying, lifting, and pulling limiting her functional abillities. She will benefit from skilled PT to address her deficits.     Clinical Presentation  Evolving    Clinical Presentation due to:  worsening pain over last few weeks    Clinical Decision Making  Moderate    Rehab Potential  Good    PT Frequency  2x / week    PT Duration  4 weeks    PT Treatment/Interventions  Cryotherapy;Electrical Stimulation;Iontophoresis 4mg /ml Dexamethasone;Moist Heat;Ultrasound;Therapeutic activities;Therapeutic exercise;Neuromuscular re-education;Manual techniques;Passive range of motion;Dry needling;Vasopneumatic Device;Joint Manipulations    PT Next Visit Plan  review HEP, elbow/wrist stretching and eccentrics, consider modalities and IONTO, MT, KT tape, DN.    Consulted and Agree with Plan of Care  Patient       Patient will benefit from skilled therapeutic intervention in order to improve the following deficits and impairments:  Decreased activity tolerance, Decreased endurance, Decreased range of motion, Decreased strength, Increased fascial restricitons, Pain  Visit Diagnosis: Pain in left elbow  Muscle weakness (generalized)  Contusion of left elbow, subsequent encounter     Problem List Patient Active Problem List   Diagnosis Date Noted  . Shingles 01/08/2018  . Menopause 01/08/2018  . Left ankle injury, subsequent encounter 08/04/2017  . PCP NOTES  >>>>>>>>>> 07/11/2017  . 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 Chronic knee pain 04/18/2015  . Plantar fasciitis 04/18/2015  . Tobacco use disorder 03/15/2015  . Morbid obesity (HCC) 01/05/2015  . Bipolar affective disorder (HCC) 01/02/2015    April Manson, PT, DPT 02/26/2018, 11:37 AM  [facility] [address]  [address] [place], [place], [address] Phone: [phone]   Fax:  [phone]  Name: DANNE SCARDINA MRN: [id number] Date of Birth: Jan 24, 1971

## 2018-03-01 MED FILL — diazePAM 5 MG TABS: 5 | 30 days supply | Qty: 60 | Fill #2

## 2018-03-06 ENCOUNTER — Encounter: Payer: Self-pay | Admitting: Internal Medicine

## 2018-03-07 ENCOUNTER — Other Ambulatory Visit: Payer: Self-pay | Admitting: Internal Medicine

## 2018-03-07 MED ORDER — VARENICLINE TARTRATE 1 MG PO TABS: 1.0000 mg | ORAL_TABLET | Freq: Two times a day (BID) | ORAL | 2 refills | Status: DC

## 2018-03-07 MED ORDER — VARENICLINE TARTRATE 0.5 MG X 11 & 1 MG X 42 PO MISC: ORAL | 0 refills | Status: DC

## 2018-03-07 MED FILL — CHANTIX STARTING MONTH BOX: 0.5 MG X 11 | 28 days supply | Qty: 53 | Fill #0

## 2018-03-14 ENCOUNTER — Ambulatory Visit: Payer: PRIVATE HEALTH INSURANCE

## 2018-03-14 DIAGNOSIS — S5002XD Contusion of left elbow, subsequent encounter: Secondary | ICD-10-CM

## 2018-03-14 DIAGNOSIS — M6281 Muscle weakness (generalized): Secondary | ICD-10-CM

## 2018-03-14 DIAGNOSIS — M25522 Pain in left elbow: Secondary | ICD-10-CM | POA: Diagnosis not present

## 2018-03-14 NOTE — Therapy (Signed)
[facility] Outpatient Rehabilitation [facility] [address]  [address] [place], [place], [address] Phone: [phone]   Fax:  [phone]  Physical Therapy Treatment  Patient Details  Name: Ashley Franklin MRN: [id number] Date of Birth: November 30, 1970 Referring Provider (PT): MD Lanell Persons   Encounter Date: 03/14/2018  PT End of Session - 03/14/18 1623    Visit Number  2    Number of Visits  8   1 eval plus 7 PT visits   Date for PT Re-Evaluation  04/02/18    Authorization Type  WC, Case Manager [employer] phone [phone], Fax [phone]    Authorization - Visit Number  1    Authorization - Number of Visits  7    PT Start Time  1616    PT Stop Time  1659    PT Time Calculation (min)  43 min    Activity Tolerance  Patient tolerated treatment well    Behavior During Therapy  [facility] for tasks assessed/performed       Past Medical History:  Diagnosis Date  . Anemia   . Anxiety   . Bipolar affective disorder (HCC)   . Blood transfusion without reported diagnosis   . Bronchospasm with bronchitis, acute   . Depression   . Kidney stone   . SBO (small bowel obstruction) (HCC) 01/2014    Past Surgical History:  Procedure Laterality Date  . CESAREAN SECTION    . ENDOMETRIAL ABLATION    . GASTRIC BYPASS  2001  . SBO  2015  . TUBAL LIGATION      There were no vitals filed for this visit.  Subjective Assessment - 03/14/18 1619    Subjective  Pt. noting she is still having, "funny bone" pain after prolonged sitting.      Pertinent History  bipolar disorder, Previous Lt ankle injury    Diagnostic tests  x-ray neg    Patient Stated Goals  get rid of the pain and use my arm more    Currently in Pain?  Yes    Pain Score  5     Pain Location  Elbow    Pain Orientation  Left    Pain Descriptors / Indicators  --   Electric fence pain    Pain Type  Acute pain    Pain Radiating Towards  electric pain extending down L arm into hand    Pain Onset  More  than a month ago    Pain Frequency  Intermittent    Multiple Pain Sites  No                       [facility] Adult PT Treatment/Exercise - 03/14/18 1639      Elbow Exercises   Elbow Flexion  Left;10 reps;AROM    Elbow Extension  Left;10 reps    Elbow Extension Limitations  leaning over TM rail     Forearm Supination  Left;10 reps    Forearm Supination Limitations  1#    Forearm Pronation  Left;10 reps    Forearm Pronation Limitations  1#      Shoulder Exercises: Seated   Other Seated Exercises  L ulnar nerve glide x 10 reps    heavy cueing required for technique     Wrist Exercises   Wrist Flexion  Left;10 reps    Wrist Flexion Limitations  13    Wrist Extension  Left;10 reps    Wrist Extension Limitations  1#  Wrist Radial Deviation  Left;10 reps    Wrist Ulnar Deviation  Left;10 reps    Other wrist exercises  L wrist flexors, extensor stretch x 30 sec       Modalities   Modalities  Iontophoresis      Iontophoresis   Type of Iontophoresis  Dexamethasone    Location  L lateral elbow    Dose  1.68mL, 62mA-min    Time  4-6 hour wear tim      Manual Therapy   Manual Therapy  Soft tissue mobilization;Myofascial release    Manual therapy comments  Supine     Soft tissue mobilization  STM to forearm extensor group     Myofascial Release  TPR to proximal forearm extensor bundle              PT Education - 03/14/18 1637    Education Details  iontophoresis educational handout including precautions contraindications proper wear time with pt. verbalizing undrestanding     Person(s) Educated  Patient    Methods  Explanation    Comprehension  Verbalized understanding;Verbal cues required;Need further instruction          PT Long Term Goals - 03/14/18 1755      PT LONG TERM GOAL #1   Title  Independent w/ ongoing HEP. 4 weeks 04/02/18.    Status  On-going      PT LONG TERM GOAL #2   Title  Pt. will demonstrate an increase in Lt Elbow ROM to WNL to  increase function. 4 weeks 04/02/18.    Status  On-going      PT LONG TERM GOAL #3   Title  Pt. will demonstrate increase in L elbow/wrist strength to 5/5 and Lt grip strength to >40 lbs to allow for increased elbow stability and functional use of arm. 4 weeks 04/02/18.    Status  On-going      PT LONG TERM GOAL #4   Title  Pt will improve FOTO to less than 40 % limited. 4 weeks 04/02/18.    Status  On-going      PT LONG TERM GOAL #5   Title  Pt will be able to perform her ususal ADL's and work duties with less than 2/10 overall pain. 4 weeks 06/02/17            Plan - 03/14/18 1755    Clinical Impression Statement  Spring primary complaint is "electric shock" pain sensation which extends down L UE into hand today.  Notes this pain increases with gripping activities such as carrying laundry basket.  Did have some increased pain with gentle wrist/forearm ROM/strengthening activities today which subsided to baseline with rest.  Manual therapy addressing hypertender TP's in L forearm extensor bundle and pt. with limited tolerance however improved tolerance for manual pressure as duration of TPR progressed.  Ended visit performing ulnar nerve glide with pt. some discomfort with this.  Pt. educated on iontophoresis with handout and ionto patch #1/6 applied to L lateral epicondyle in area of most tenderness.  Will monitor response in coming visit.      PT Treatment/Interventions  Cryotherapy;Electrical Stimulation;Iontophoresis 4mg /ml Dexamethasone;Moist Heat;Ultrasound;Therapeutic activities;Therapeutic exercise;Neuromuscular re-education;Manual techniques;Passive range of motion;Dry needling;Vasopneumatic Device;Joint Manipulations    Consulted and Agree with Plan of Care  Patient       Patient will benefit from skilled therapeutic intervention in order to improve the following deficits and impairments:  Decreased activity tolerance, Decreased endurance, Decreased range of motion, Decreased  strength, Increased fascial  restricitons, Pain  Visit Diagnosis: Pain in left elbow  Muscle weakness (generalized)  Contusion of left elbow, subsequent encounter     Problem List Patient Active Problem List   Diagnosis Date Noted  . Shingles 01/08/2018  . Menopause 01/08/2018  . Left ankle injury, subsequent encounter 08/04/2017  . PCP NOTES >>>>>>>>>> 07/11/2017  . 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 Chronic knee pain 04/18/2015  . Plantar fasciitis 04/18/2015  . Tobacco use disorder 03/15/2015  . Morbid obesity (HCC) 01/05/2015  . Bipolar affective disorder [facility]) 01/02/2015    Kermit Balo, PTA 03/14/18 6:18 PM   [facility] Outpatient Rehabilitation [facility] [address]  [address] [place], [place], [address] Phone: [phone]   Fax:  [phone]  Name: Ashley Franklin MRN: [id number] Date of Birth: August 29, 1970

## 2018-03-14 NOTE — Patient Instructions (Signed)

## 2018-03-19 ENCOUNTER — Ambulatory Visit: Payer: PRIVATE HEALTH INSURANCE | Attending: Family Medicine | Admitting: Physical Therapy

## 2018-03-19 ENCOUNTER — Encounter: Payer: Self-pay | Admitting: Physical Therapy

## 2018-03-19 DIAGNOSIS — M25522 Pain in left elbow: Secondary | ICD-10-CM | POA: Diagnosis present

## 2018-03-19 DIAGNOSIS — M6281 Muscle weakness (generalized): Secondary | ICD-10-CM | POA: Diagnosis present

## 2018-03-19 DIAGNOSIS — S5002XD Contusion of left elbow, subsequent encounter: Secondary | ICD-10-CM | POA: Diagnosis present

## 2018-03-19 NOTE — Patient Instructions (Addendum)

## 2018-03-19 NOTE — Therapy (Addendum)
[facility] Outpatient Rehabilitation [facility] [address]  [address] [place], [place], [address] Phone: [phone]   Fax:  [phone]  Physical Therapy Treatment  Patient Details  Name: Ashley Franklin MRN: [id number] Date of Birth: 1970-07-06 Referring Provider (PT): MD Lanell Persons   Encounter Date: 03/19/2018  PT End of Session - 03/19/18 1617    Visit Number  3    Number of Visits  7   1 eval plus 6 PT visits   Date for PT Re-Evaluation  04/02/18    Authorization Type  WC, Case Manager [employer] phone [phone], Fax [phone]    Authorization - Visit Number  3   1 eval plus 6 PT visits   Authorization - Number of Visits  7    PT Start Time  1617    PT Stop Time  1711    PT Time Calculation (min)  54 min    Activity Tolerance  Patient tolerated treatment well    Behavior During Therapy  [facility] for tasks assessed/performed       Past Medical History:  Diagnosis Date  . Anemia   . Anxiety   . Bipolar affective disorder (HCC)   . Blood transfusion without reported diagnosis   . Bronchospasm with bronchitis, acute   . Depression   . Kidney stone   . SBO (small bowel obstruction) (HCC) 01/2014    Past Surgical History:  Procedure Laterality Date  . CESAREAN SECTION    . ENDOMETRIAL ABLATION    . GASTRIC BYPASS  2001  . SBO  2015  . TUBAL LIGATION      There were no vitals filed for this visit.  Subjective Assessment - 03/19/18 1620    Subjective  Pt reporting increased pain lasting "a solid 48 hrs" following last therapy session.    Pertinent History  bipolar disorder, Previous Lt ankle injury    Diagnostic tests  x-ray neg    Patient Stated Goals  get rid of the pain and use my arm more    Currently in Pain?  Yes    Pain Score  4     Pain Location  Elbow    Pain Orientation  Left    Pain Descriptors / Indicators  Constant   "electrical current or shock"   Pain Type  Acute pain    Pain Frequency  Constant                        [facility] Adult PT Treatment/Exercise - 03/19/18 1617      Exercises   Exercises  Elbow;Wrist      Elbow Exercises   Forearm Supination  Left;10 reps    Bar Weights/Barbell (Forearm Supination)  1 lb    Forearm Supination Limitations  weight held at end    Forearm Pronation  Left;10 reps    Bar Weights/Barbell (Forearm Pronation)  1 lb    Forearm Pronation Limitations  weight held at end    Other elbow exercises  UBE - L1.5 x 6 min (3' fwd/3" back)      Shoulder Exercises: Seated   Other Seated Exercises  L ulnar nerve glide x 10 reps       Wrist Exercises   Wrist Extension  Left;10 reps    Bar Weights/Barbell (Wrist Extension)  1 lb    Wrist Extension Limitations  AROM eccentric with P/AAROM concentric - pt requiring clarfication of proper eccentric focus  Other wrist exercises  L wrist flexors, extensor stretch 2 x 30 sec       Modalities   Modalities  Electrical Stimulation;Cryotherapy      Cryotherapy   Number Minutes Cryotherapy  10 Minutes    Cryotherapy Location  --   Lt elbow   Type of Cryotherapy  Ice pack      Electrical Stimulation   Electrical Stimulation Location  Lt elbow & wrist extensor group    Electrical Stimulation Action  IFC    Electrical Stimulation Parameters  80-150 Hz, intensity to tol x 10 min'    Electrical Stimulation Goals  Pain      Manual Therapy   Manual Therapy  Soft tissue mobilization;Myofascial release    Manual therapy comments  Supine     Soft tissue mobilization  STM & DTM/CFM to forearm extensor group     Myofascial Release  manual TPR to proximal forearm extensor bundle        Trigger Point Dry Needling - 03/19/18 1617    Consent Given?  Yes    Education Handout Provided  Yes    Muscles Treated Upper Body  --   L wrist extensor group: + twitch response/dec muscle tension          PT Education - 03/19/18 1628    Education Details  Role of DN, expected response to treatment &  recommended post-treatment activity; Cross friction & ice massage for lateral epicondylitis    Person(s) Educated  Patient    Methods  Explanation;Demonstration;Handout    Comprehension  Verbalized understanding          PT Long Term Goals - 03/14/18 1755      PT LONG TERM GOAL #1   Title  Independent w/ ongoing HEP. 4 weeks 04/02/18.    Status  On-going      PT LONG TERM GOAL #2   Title  Pt. will demonstrate an increase in Lt Elbow ROM to WNL to increase function. 4 weeks 04/02/18.    Status  On-going      PT LONG TERM GOAL #3   Title  Pt. will demonstrate increase in L elbow/wrist strength to 5/5 and Lt grip strength to >40 lbs to allow for increased elbow stability and functional use of arm. 4 weeks 04/02/18.    Status  On-going      PT LONG TERM GOAL #4   Title  Pt will improve FOTO to less than 40 % limited. 4 weeks 04/02/18.    Status  On-going      PT LONG TERM GOAL #5   Title  Pt will be able to perform her ususal ADL's and work duties with less than 2/10 overall pain. 4 weeks 06/02/17            Plan - 03/19/18 1624    Clinical Impression Statement  Spring reporting increased pain lasting ~48 hrs following last therapy session but states pain overall decreased since start of PT. Continues to report pain sensation of "electric shock" with radicular pain reproduced with deep palpation of L wrist extensor group with multiple taut bands/TPs identified - addressed this with manual therapy including DN upon informed consent from pt with positive twitch response elicited and palpable reduction in muscle tension along with decreased ttp. Reviewed HEP stretches and clarified proper eccentric wrist extension as well as instructed pt in cross-friction massage and ice massage to continue to reduce irritation.     Rehab Potential  Good  PT Treatment/Interventions  Cryotherapy;Electrical Stimulation;Iontophoresis 4mg /ml Dexamethasone;Moist Heat;Ultrasound;Therapeutic  activities;Therapeutic exercise;Neuromuscular re-education;Manual techniques;Passive range of motion;Dry needling;Vasopneumatic Device;Joint Manipulations;Patient/family education;ADLs/Self Care Home Management    Consulted and Agree with Plan of Care  Patient       Patient will benefit from skilled therapeutic intervention in order to improve the following deficits and impairments:  Decreased activity tolerance, Decreased endurance, Decreased range of motion, Decreased strength, Increased fascial restricitons, Pain  Visit Diagnosis: Pain in left elbow  Muscle weakness (generalized)     Problem List Patient Active Problem List   Diagnosis Date Noted  . Shingles 01/08/2018  . Menopause 01/08/2018  . Left ankle injury, subsequent encounter 08/04/2017  . PCP NOTES >>>>>>>>>> 07/11/2017  . 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 Chronic knee pain 04/18/2015  . Plantar fasciitis 04/18/2015  . Tobacco use disorder 03/15/2015  . Morbid obesity (HCC) 01/05/2015  . Bipolar affective disorder (HCC) 01/02/2015    Marry Guan, PT, MPT 03/19/2018, 6:58 PM  [facility] [address]  [address] [place], [place], [address] Phone: [phone]   Fax:  [phone]  Name: Ashley Franklin MRN: [id number] Date of Birth: August 13, 1970

## 2018-03-21 ENCOUNTER — Ambulatory Visit: Payer: PRIVATE HEALTH INSURANCE

## 2018-03-21 DIAGNOSIS — M25522 Pain in left elbow: Secondary | ICD-10-CM | POA: Diagnosis not present

## 2018-03-21 DIAGNOSIS — M6281 Muscle weakness (generalized): Secondary | ICD-10-CM

## 2018-03-21 DIAGNOSIS — S5002XD Contusion of left elbow, subsequent encounter: Secondary | ICD-10-CM

## 2018-03-21 NOTE — Therapy (Signed)
[facility] [address]  [address] [place], [place], [address] Phone: [phone]   Fax:  [phone]  Physical Therapy Treatment  Patient Details  Name: Ashley Franklin MRN: [id number] Date of Birth: Feb 22, 1971 Referring Provider (PT): MD Lanell Persons   Encounter Date: 03/21/2018  PT End of Session - 03/21/18 1620    Visit Number  4    Number of Visits  7   1 eval plus 6 PT visits   Date for PT Re-Evaluation  04/02/18    Authorization Type  [facility], Case Manager [employer] phone [phone], Fax [phone]    Authorization - Visit Number  4   1 eval plus 6 PT visits   Authorization - Number of Visits  7    PT Start Time  1615    PT Stop Time  1712    PT Time Calculation (min)  57 min    Activity Tolerance  Patient tolerated treatment well    Behavior During Therapy  [facility] for tasks assessed/performed       Past Medical History:  Diagnosis Date  . Anemia   . Anxiety   . Bipolar affective disorder (HCC)   . Blood transfusion without reported diagnosis   . Bronchospasm with bronchitis, acute   . Depression   . Kidney stone   . SBO (small bowel obstruction) (HCC) 01/2014    Past Surgical History:  Procedure Laterality Date  . CESAREAN SECTION    . ENDOMETRIAL ABLATION    . GASTRIC BYPASS  2001  . SBO  2015  . TUBAL LIGATION      There were no vitals filed for this visit.  Subjective Assessment - 03/21/18 1617    Subjective  Pt. noting L elbow pain much improved and attributes this to DN last session and unsure if other contributing factors.      Pertinent History  bipolar disorder, Previous Lt ankle injury    Diagnostic tests  x-ray neg    Patient Stated Goals  get rid of the pain and use my arm more    Currently in Pain?  Yes    Pain Score  3     Pain Location  Elbow    Pain Orientation  Left    Pain Descriptors / Indicators  --   "hitting funny bone sensation"   Pain Type  Acute pain    Pain  Radiating Towards  no longer extending electric sensation down into hand however local to elbow in short-lasting duration     Pain Onset  More than a month ago    Pain Frequency  Intermittent    Aggravating Factors   straightening arm fully     Pain Relieving Factors  rest     Multiple Pain Sites  No                       [facility] Adult PT Treatment/Exercise - 03/21/18 1631      Elbow Exercises   Other elbow exercises  UBE - L1.5 x 6 min (3' fwd/3" back)      Wrist Exercises   Wrist Extension  Left;15 reps    Bar Weights/Barbell (Wrist Extension)  1 lb    Wrist Extension Limitations  AROM eccentric at 3" count lowering AAROM on concentric     Wrist Ulnar Deviation  Left;10 reps    Bar Weights/Barbell (Ulnar Deviation)  1 lb    Other wrist exercises  L wrist extensor stretch 2 x 30 sec       Cryotherapy   Number Minutes Cryotherapy  10 Minutes   + only 5 min ice massage 4due to pt. limited tolerance    Type of Cryotherapy  Ice massage      Electrical Stimulation   Electrical Stimulation Location  Lt elbow & wrist extensor group    Electrical Stimulation Action  IC    Electrical Stimulation Parameters  80-150Hz , intensity to pt. tolerance., 15'      Iontophoresis   Type of Iontophoresis  Dexamethasone    Location  L lateral elbow    Dose  1.57mL, 51mA-min    Time  4-6 hour wear tim   2#/6     Manual Therapy   Manual Therapy  Soft tissue mobilization;Myofascial release    Manual therapy comments  Supine     Soft tissue mobilization  STM to L forearm extensor bunle proximal to elbow in area of most tenderness; pt. noting improved tolerance to pressure     Myofascial Release  TPR to TP's x 2 in L forearm extensor bundle; good response with twitch                   PT Long Term Goals - 03/14/18 1755      PT LONG TERM GOAL #1   Title  Independent w/ ongoing HEP. 4 weeks 04/02/18.    Status  On-going      PT LONG TERM GOAL #2   Title  Pt. will  demonstrate an increase in Lt Elbow ROM to WNL to increase function. 4 weeks 04/02/18.    Status  On-going      PT LONG TERM GOAL #3   Title  Pt. will demonstrate increase in L elbow/wrist strength to 5/5 and Lt grip strength to >40 lbs to allow for increased elbow stability and functional use of arm. 4 weeks 04/02/18.    Status  On-going      PT LONG TERM GOAL #4   Title  Pt will improve FOTO to less than 40 % limited. 4 weeks 04/02/18.    Status  On-going      PT LONG TERM GOAL #5   Title  Pt will be able to perform her ususal ADL's and work duties with less than 2/10 overall pain. 4 weeks 06/02/17            Plan - 03/21/18 1622    Clinical Impression Statement  Spring reporting increased pain for a few hours after last visit then good pain relief which lasted over the last two days.  Attributes relief to DN performed last visit.  Pt. tolerated mild progression of wrist strengthening activities and addition of ice massage x 5 min well today.  Pt. remains very ttp at proximal L forearm extensor bundle however did note improved tolerance today with manual therapy.  Ended visit with E-stim/ice to L elbow and ionto patch 2#/6 as pt. noting benefit from this.  Pt. may benefit from further DN in future visits.      PT Treatment/Interventions  Cryotherapy;Electrical Stimulation;Iontophoresis 4mg /ml Dexamethasone;Moist Heat;Ultrasound;Therapeutic activities;Therapeutic exercise;Neuromuscular re-education;Manual techniques;Passive range of motion;Dry needling;Vasopneumatic Device;Joint Manipulations;Patient/family education;ADLs/Self Care Home Management       Patient will benefit from skilled therapeutic intervention in order to improve the following deficits and impairments:  Decreased activity tolerance, Decreased endurance, Decreased range of motion, Decreased strength, Increased fascial restricitons, Pain  Visit Diagnosis: Pain in left elbow  Muscle weakness (  generalized)  Contusion  of left elbow, subsequent encounter     Problem List Patient Active Problem List   Diagnosis Date Noted  . Shingles 01/08/2018  . Menopause 01/08/2018  . Left ankle injury, subsequent encounter 08/04/2017  . PCP NOTES >>>>>>>>>> 07/11/2017  . 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 Chronic knee pain 04/18/2015  . Plantar fasciitis 04/18/2015  . Tobacco use disorder 03/15/2015  . Morbid obesity (HCC) 01/05/2015  . Bipolar affective disorder [facility]) 01/02/2015    Kermit Balo, PTA 03/21/18 5:27 PM   [facility] [address]  [address] [place], [place], [address] Phone: [phone]   Fax:  [phone]  Name: Ashley Franklin MRN: [id number] Date of Birth: 05/13/1971
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
## 2018-03-26 ENCOUNTER — Ambulatory Visit: Payer: PRIVATE HEALTH INSURANCE

## 2018-03-26 DIAGNOSIS — M25522 Pain in left elbow: Secondary | ICD-10-CM

## 2018-03-26 DIAGNOSIS — M6281 Muscle weakness (generalized): Secondary | ICD-10-CM

## 2018-03-26 NOTE — Therapy (Signed)
[facility] Outpatient Rehabilitation [facility] [address]  [address] [place], [place], [address] Phone: [phone]   Fax:  [phone]  Physical Therapy Treatment  Patient Details  Name: Ashley Franklin MRN: [id number] Date of Birth: Aug 10, 1970 Referring Provider (PT): MD Lanell Persons   Encounter Date: 03/26/2018  PT End of Session - 03/26/18 1619    Visit Number  5    Number of Visits  7   1 eval plus 6 PT visits   Date for PT Re-Evaluation  04/02/18    Authorization Type  WC, Case Manager [employer] phone [phone], Fax [phone]    Authorization - Visit Number  5   1 eval plus 6 PT visits   Authorization - Number of Visits  7    PT Start Time  1615    PT Stop Time  1715    PT Time Calculation (min)  60 min    Activity Tolerance  Patient tolerated treatment well    Behavior During Therapy  [facility] for tasks assessed/performed       Past Medical History:  Diagnosis Date  . Anemia   . Anxiety   . Bipolar affective disorder (HCC)   . Blood transfusion without reported diagnosis   . Bronchospasm with bronchitis, acute   . Depression   . Kidney stone   . SBO (small bowel obstruction) (HCC) 01/2014    Past Surgical History:  Procedure Laterality Date  . CESAREAN SECTION    . ENDOMETRIAL ABLATION    . GASTRIC BYPASS  2001  . SBO  2015  . TUBAL LIGATION      There were no vitals filed for this visit.  Subjective Assessment - 03/26/18 1617    Subjective  Pt. noting most relief from DN at this point in therapy.  Pt. noting she reached to back seat from passengers seat to put laptop back and had increased pain at L elbow.      Pertinent History  bipolar disorder, Previous Lt ankle injury    Diagnostic tests  x-ray neg    Patient Stated Goals  get rid of the pain and use my arm more    Currently in Pain?  Yes    Pain Score  2     Pain Location  Elbow    Pain Orientation  Left    Pain Descriptors / Indicators  --   "funny bone feeling"    Pain Type  Acute pain         [facility] PT Assessment - 03/26/18 1638      Strength   Overall Strength Comments  L grip 35#, R grip strength 55#                   [facility] Adult PT Treatment/Exercise - 03/26/18 1629      Self-Care   Self-Care  Other Self-Care Comments    Other Self-Care Comments   Discussed performing L biceps curl with yellow TB at home if able to perform movement without resistance first (see pt. instruction)      Shoulder Exercises: Seated   Other Seated Exercises  L ulnar nerve glide x 10 reps       Wrist Exercises   Wrist Flexion  Left;15 reps    Wrist Flexion Limitations  1    Wrist Extension  Left;10 reps    Bar Weights/Barbell (Wrist Extension)  1 lb    Other wrist exercises  L wrist extensor stretch 3 x  30 sec       Cryotherapy   Number Minutes Cryotherapy  15 Minutes    Cryotherapy Location  Forearm   L elbow   Type of Cryotherapy  Ice pack      Electrical Stimulation   Electrical Stimulation Location  Lt elbow & wrist extensor group    Electrical Stimulation Action  IFC    Electrical Stimulation Parameters  intensity to pt. tolerance, 15'    Electrical Stimulation Goals  Pain      Manual Therapy   Manual Therapy  Soft tissue mobilization;Myofascial release    Manual therapy comments  Supine     Soft tissue mobilization  STM/DTM to L forearm extensor bundle; pt. ttp     Myofascial Release  TPR to L proximal forearm extensor bundle - palpable TP with limited response              PT Education - 03/26/18 1716    Education Details  HEP update; yellow TB issued to pt. to used on biceps curl only if no pain with non resisted biceps curl with pt. instructed in this plan     Person(s) Educated  Patient    Methods  Explanation;Demonstration;Verbal cues;Handout    Comprehension  Verbalized understanding;Returned demonstration;Verbal cues required;Need further instruction          PT Long Term Goals - 03/14/18 1755      PT  LONG TERM GOAL #1   Title  Independent w/ ongoing HEP. 4 weeks 04/02/18.    Status  On-going      PT LONG TERM GOAL #2   Title  Pt. will demonstrate an increase in Lt Elbow ROM to WNL to increase function. 4 weeks 04/02/18.    Status  On-going      PT LONG TERM GOAL #3   Title  Pt. will demonstrate increase in L elbow/wrist strength to 5/5 and Lt grip strength to >40 lbs to allow for increased elbow stability and functional use of arm. 4 weeks 04/02/18.    Status  On-going      PT LONG TERM GOAL #4   Title  Pt will improve FOTO to less than 40 % limited. 4 weeks 04/02/18.    Status  On-going      PT LONG TERM GOAL #5   Title  Pt will be able to perform her ususal ADL's and work duties with less than 2/10 overall pain. 4 weeks 06/02/17            Plan - 03/26/18 1620    Clinical Impression Statement  Pt. noting she put laptop in back seat from drivers seat with her L hand on Saturday and has had increased pain for a few days since this.  Noting she does not feel significant improvement in functional ability with L UE still having pain with most use.  Pt. was able to demo L grip strength of 35# today much improved from last tested (5#).  L UE strengthening activities kept conservative today as pt. still with limited tolerance for forearm strengthening activities.  HEP updated today with elbow strengthening activities with pt. instructed to add yellow TB resistance if able without pain.  Will continue to progress toward goals.      PT Treatment/Interventions  Cryotherapy;Electrical Stimulation;Iontophoresis 4mg /ml Dexamethasone;Moist Heat;Ultrasound;Therapeutic activities;Therapeutic exercise;Neuromuscular re-education;Manual techniques;Passive range of motion;Dry needling;Vasopneumatic Device;Joint Manipulations;Patient/family education;ADLs/Self Care Home Management    Consulted and Agree with Plan of Care  Patient       Patient  will benefit from skilled therapeutic intervention in  order to improve the following deficits and impairments:  Decreased activity tolerance, Decreased endurance, Decreased range of motion, Decreased strength, Increased fascial restricitons, Pain  Visit Diagnosis: Pain in left elbow  Muscle weakness (generalized)     Problem List Patient Active Problem List   Diagnosis Date Noted  . Shingles 01/08/2018  . Menopause 01/08/2018  . Left ankle injury, subsequent encounter 08/04/2017  . PCP NOTES >>>>>>>>>> 07/11/2017  . 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 Chronic knee pain 04/18/2015  . Plantar fasciitis 04/18/2015  . Tobacco use disorder 03/15/2015  . Morbid obesity (HCC) 01/05/2015  . Bipolar affective disorder [facility]) 01/02/2015    Kermit Balo, PTA 03/26/18 6:19 PM   [facility] Rehabilitation [facility] [address]  [address] [place], [place], [address] Phone: [phone]   Fax:  [phone]  Name: Ashley Franklin MRN: [id number] Date of Birth: 15-Aug-1970

## 2018-03-28 ENCOUNTER — Ambulatory Visit: Payer: PRIVATE HEALTH INSURANCE

## 2018-03-28 DIAGNOSIS — M6281 Muscle weakness (generalized): Secondary | ICD-10-CM

## 2018-03-28 DIAGNOSIS — M25522 Pain in left elbow: Secondary | ICD-10-CM | POA: Diagnosis not present

## 2018-03-28 NOTE — Therapy (Signed)
[facility] [address]  [address] [place], [place], [address] Phone: [phone]   Fax:  [phone]  Physical Therapy Treatment  Patient Details  Name: Ashley Franklin MRN: [id number] Date of Birth: [birth year] Referring Provider (PT): MD Lanell Personshomas Kingsley   Encounter Date: 03/28/2018  PT End of Session - 03/28/18 1618    Visit Number  6    Number of Visits  7   1 eval plus 6 PT visits   Date for PT Re-Evaluation  04/02/18    Authorization Type  WC, Case Manager [employer] phone [phone], Fax [phone]    Authorization - Visit Number  6   1 eval plus 6 PT visits   Authorization - Number of Visits  7    PT Start Time  1614    PT Stop Time  1708    PT Time Calculation (min)  54 min    Activity Tolerance  Patient tolerated treatment well    Behavior During Therapy  [facility] for tasks assessed/performed       Past Medical History:  Diagnosis Date  . Anemia   . Anxiety   . Bipolar affective disorder (HCC)   . Blood transfusion without reported diagnosis   . Bronchospasm with bronchitis, acute   . Depression   . Kidney stone   . SBO (small bowel obstruction) (HCC) 01/2014    Past Surgical History:  Procedure Laterality Date  . CESAREAN SECTION    . ENDOMETRIAL ABLATION    . GASTRIC BYPASS  2001  . SBO  2015  . TUBAL LIGATION      There were no vitals filed for this visit.  Subjective Assessment - 03/28/18 1615    Subjective  Pt. reporting better tolerance for L elbow AROM flexion HEP activity as compared to yellow band resisted elbow flexion.      Pertinent History  bipolar disorder, Previous Lt ankle injury    Diagnostic tests  x-ray neg    Patient Stated Goals  get rid of the pain and use my arm more    Currently in Pain?  Yes    Pain Score  3     Pain Location  Elbow    Pain Orientation  Left    Pain Descriptors / Indicators  Cramping    Pain Type  Acute pain    Pain Radiating Towards  intermittent  "nervy" feeling into posterior forearm     Pain Onset  More than a month ago    Pain Frequency  Intermittent    Aggravating Factors   "all the way extended", "all the way flexed"    Pain Relieving Factors  rest     Multiple Pain Sites  No                       [facility] Adult PT Treatment/Exercise - 03/28/18 1613      Elbow Exercises   Elbow Flexion  Left;15 reps;Standing   yellow TB   Elbow Extension  Left;15 reps   Cues to avoid painful full extension arc   Elbow Extension Limitations  Yellow TB     Other elbow exercises  UBE - L2.0 x 6 min (3' fwd/3" back)      Wrist Exercises   Wrist Flexion  Left;15 reps    Wrist Flexion Limitations  1    Wrist Extension  Left;20 reps    Wrist Extension Limitations  3" eccentric  Wrist Radial Deviation  Left;15 reps    Wrist Ulnar Deviation  Left;15 reps    Other wrist exercises  L wrist extensor stretch 3 x 30 sec       Cryotherapy   Number Minutes Cryotherapy  10 Minutes    Cryotherapy Location  Forearm    Type of Cryotherapy  Ice pack      Electrical Stimulation   Electrical Stimulation Location  Lt elbow & wrist extensor group    Electrical Stimulation Action  IFC    Electrical Stimulation Parameters  intensity to pt. tolerance, 10'    Electrical Stimulation Goals  Pain      Manual Therapy   Manual Therapy  Soft tissue mobilization;Myofascial release;Taping    Manual therapy comments  Supine     Soft tissue mobilization  STM & DTM/CFM to L forearm extensor group     Myofascial Release  manual TPR to L mid & proximal forearm extensor bundle     Kinesiotex  Inhibit Muscle      Kinesiotix   Inhibit Muscle   L lateral epicondylitis - 30% Y strip from radial side of posterior wrist with tails of Y medial & laterl to extensor group at elbow       Trigger Point Dry Needling - 03/28/18 1621    Consent Given?  Yes    Muscles Treated Upper Body  --   L wrist extensor group: + twitch reposnse/dec muscle tension                PT Long Term Goals - 03/14/18 1755      PT LONG TERM GOAL #1   Title  Independent w/ ongoing HEP. 4 weeks 04/02/18.    Status  On-going      PT LONG TERM GOAL #2   Title  Pt. will demonstrate an increase in Lt Elbow ROM to WNL to increase function. 4 weeks 04/02/18.    Status  On-going      PT LONG TERM GOAL #3   Title  Pt. will demonstrate increase in L elbow/wrist strength to 5/5 and Lt grip strength to >40 lbs to allow for increased elbow stability and functional use of arm. 4 weeks 04/02/18.    Status  On-going      PT LONG TERM GOAL #4   Title  Pt will improve FOTO to less than 40 % limited. 4 weeks 04/02/18.    Status  On-going      PT LONG TERM GOAL #5   Title  Pt will be able to perform her ususal ADL's and work duties with less than 2/10 overall pain. 4 weeks 06/02/17            Plan - 03/28/18 1618    Clinical Impression Statement  Spring reporting she has gotten good relief from DN in past visits and wishes to continue DN again today.  Pt. reporting ongoing tenderness in L wrist extensor musculature.  Supervising PT performing DN to L wrist extensor group today to start session.  Ended visit with gentle L wrist, elbow strengthening and ROM activities which were well tolerated.  Pt. with short-lasting increased pain at L elbow with end range elbow flexion/extension motions today.  Notes relief from elbow pain with elbow extension/forearm pronation positioning today.  Supervising PT applied trial of forearm K-taping for hopeful improvement in blood flow and reduction in tone and pain over weekend.  Ended visit with low-level L elbow pain thus applied ice/compression to reduce pain.  Pt. will continue to benefit from further skilled therapy to improve L UE tissue quality, reduce pain with functional movements, and improved tolerance for work-related activities.      PT Treatment/Interventions  Cryotherapy;Electrical Stimulation;Iontophoresis 4mg /ml  Dexamethasone;Moist Heat;Ultrasound;Therapeutic activities;Therapeutic exercise;Neuromuscular re-education;Manual techniques;Passive range of motion;Dry needling;Vasopneumatic Device;Joint Manipulations;Patient/family education;ADLs/Self Care Home Management    Consulted and Agree with Plan of Care  Patient       Patient will benefit from skilled therapeutic intervention in order to improve the following deficits and impairments:  Decreased activity tolerance, Decreased endurance, Decreased range of motion, Decreased strength, Increased fascial restricitons, Pain  Visit Diagnosis: Pain in left elbow  Muscle weakness (generalized)     Problem List Patient Active Problem List   Diagnosis Date Noted  . Shingles 01/08/2018  . Menopause 01/08/2018  . Left ankle injury, subsequent encounter 08/04/2017  . PCP NOTES >>>>>>>>>> 07/11/2017  . 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 Chronic knee pain 04/18/2015  . Plantar fasciitis 04/18/2015  . Tobacco use disorder 03/15/2015  . Morbid obesity (HCC) 01/05/2015  . Bipolar affective disorder [facility]) 01/02/2015    Kermit Balo, PTA 03/28/18 6:11 PM   [facility] Outpatient Rehabilitation [facility] [address]  [address] [place], [place], [address] Phone: [phone]   Fax:  [phone]  Name: IZA PRESTON MRN: [id number] Date of Birth: 1970-11-05

## 2018-04-01 ENCOUNTER — Ambulatory Visit (INDEPENDENT_AMBULATORY_CARE_PROVIDER_SITE_OTHER): Payer: No Typology Code available for payment source | Admitting: Psychiatry

## 2018-04-01 ENCOUNTER — Encounter (HOSPITAL_COMMUNITY): Payer: Self-pay | Admitting: Psychiatry

## 2018-04-01 VITALS — BP 127/86 | HR 89 | Ht 64.0 in | Wt 231.0 lb

## 2018-04-01 DIAGNOSIS — F419 Anxiety disorder, unspecified: Secondary | ICD-10-CM

## 2018-04-01 DIAGNOSIS — F3181 Bipolar II disorder: Secondary | ICD-10-CM

## 2018-04-01 DIAGNOSIS — G4700 Insomnia, unspecified: Secondary | ICD-10-CM | POA: Diagnosis not present

## 2018-04-01 DIAGNOSIS — F3131 Bipolar disorder, current episode depressed, mild: Secondary | ICD-10-CM

## 2018-04-01 MED ORDER — DIAZEPAM 5 MG PO TABS: 5.0000 mg | ORAL_TABLET | Freq: Two times a day (BID) | ORAL | 2 refills | Status: DC | PRN

## 2018-04-01 MED ORDER — DIVALPROEX SODIUM ER 500 MG PO TB24: 2000.0000 mg | ORAL_TABLET | Freq: Every day | ORAL | 0 refills | Status: DC

## 2018-04-01 MED ORDER — QUETIAPINE FUMARATE 200 MG PO TABS: 200.0000 mg | ORAL_TABLET | Freq: Every day | ORAL | 0 refills | Status: DC

## 2018-04-01 MED ORDER — LAMOTRIGINE 25 MG PO TABS: 50.0000 mg | ORAL_TABLET | Freq: Every day | ORAL | 0 refills | Status: DC

## 2018-04-01 MED ORDER — ZOLPIDEM TARTRATE 5 MG PO TABS: 5.0000 mg | ORAL_TABLET | Freq: Every day | ORAL | 2 refills | Status: DC

## 2018-04-01 MED ORDER — QUETIAPINE FUMARATE 100 MG PO TABS: 100.0000 mg | ORAL_TABLET | Freq: Two times a day (BID) | ORAL | 0 refills | Status: DC

## 2018-04-01 MED ORDER — FLUOXETINE HCL 20 MG PO TABS: 80.0000 mg | ORAL_TABLET | Freq: Every day | ORAL | 0 refills | Status: DC

## 2018-04-01 MED FILL — ZOLPIDEM TARTRATE 5 MG TAB: 5 | 30 days supply | Qty: 30 | Fill #0

## 2018-04-01 MED FILL — DIVALPROEX SOD ER 500 MG TA: 500 | 90 days supply | Qty: 360 | Fill #0

## 2018-04-01 MED FILL — lamoTRIgine 25 MG TABS: 25 | 90 days supply | Qty: 180 | Fill #0

## 2018-04-01 MED FILL — diazePAM 5 MG TABS: 5 | 30 days supply | Qty: 60 | Fill #0

## 2018-04-01 MED FILL — QUETIAPINE FUMARATE 200 MG: 200 | 90 days supply | Qty: 90 | Fill #0

## 2018-04-01 NOTE — Patient Instructions (Signed)
1.Continue Depakote 2000 mg at night  3. Continue fluoxetine 80 mg daily  4. Increase lamotrigine 50 mg daily 5. Continue quetiapine 100 mg twice a day and 200 mg at night 6. Continue diazepam 5 mg twice a day as needed for anxiety  7. ContinueAmbien5 mg at night as needed for sleep  8. Return to clinic in three months for 30 mins - Referral to therapy

## 2018-04-02 ENCOUNTER — Ambulatory Visit: Payer: PRIVATE HEALTH INSURANCE

## 2018-04-02 DIAGNOSIS — M25522 Pain in left elbow: Secondary | ICD-10-CM | POA: Diagnosis not present

## 2018-04-02 DIAGNOSIS — M6281 Muscle weakness (generalized): Secondary | ICD-10-CM

## 2018-04-02 NOTE — Therapy (Addendum)
[facility] [address]  [place], [place], [address] Phone: [phone]   Fax:  [phone]  Physical Therapy Treatment / Discharge Summary  Patient Details  Name: Ashley Franklin MRN: [id number] Date of Birth: 07-May-1971 Referring Provider (PT): MD Odis Luster   Encounter Date: 04/02/2018  PT End of Session - 04/02/18 1622    Visit Number  7    Number of Visits  7   1 eval plus 6 PT visits   Date for PT Re-Evaluation  04/02/18    Authorization Type  WC, Case Manager [employer] phone [phone], Fax [phone]    Authorization - Visit Number  7   1 eval plus 6 PT visits   Authorization - Number of Visits  7    PT Start Time  1616    PT Stop Time  1708    PT Time Calculation (min)  52 min    Activity Tolerance  Patient tolerated treatment well    Behavior During Therapy  [facility] for tasks assessed/performed       Past Medical History:  Diagnosis Date  . Anemia   . Anxiety   . Bipolar affective disorder ([place])   . Blood transfusion without reported diagnosis   . Bronchospasm with bronchitis, acute   . Depression   . Kidney stone   . SBO (small bowel obstruction) ([place]) 01/2014    Past Surgical History:  Procedure Laterality Date  . CESAREAN SECTION    . ENDOMETRIAL ABLATION    . GASTRIC BYPASS  2001  . SBO  2015  . TUBAL LIGATION      There were no vitals filed for this visit.  Subjective Assessment - 04/02/18 1618    Subjective  Pt. noting some increased pain after last visit and reports, "It felt about the same as it did after other visits".      Pertinent History  bipolar disorder, Previous Lt ankle injury    Diagnostic tests  x-ray neg    Patient Stated Goals  get rid of the pain and use my arm more    Currently in Pain?  Yes    Pain Score  2     Pain Location  Elbow    Pain Orientation  Left    Pain Descriptors / Indicators  --   "electric shock"   Pain Type  Acute pain    Pain  Radiating Towards  intermittent "nervy" pain into mid forearm    Pain Onset  More than a month ago    Pain Frequency  Intermittent    Aggravating Factors   extending elbow fully    Multiple Pain Sites  No         [facility] PT Assessment - 04/02/18 1630      Observation/Other Assessments   Focus on Therapeutic Outcomes (FOTO)   52% (48% limitation)      AROM   AROM Assessment Site  Elbow    Right/Left Elbow  Left    Left Elbow Flexion  133    Left Elbow Extension  1      Strength   Overall Strength Comments  R 52.5#, L 32#    Strength Assessment Site  Elbow;Forearm;Wrist    Right/Left Elbow  Left    Left Elbow Flexion  4/5    Left Elbow Extension  4/5    Right/Left Forearm  Right;Left    Right Forearm Pronation  4+/5    Right Forearm  Supination  4+/5    Left Forearm Pronation  4/5    Left Forearm Supination  4/5    Right/Left Wrist  Left;Right    Right Wrist Flexion  4/5    Right Wrist Extension  4+/5    Right Wrist Radial Deviation  4+/5    Right Wrist Ulnar Deviation  4+/5    Left Wrist Flexion  4/5    Left Wrist Extension  4/5    Left Wrist Radial Deviation  4/5    Left Wrist Ulnar Deviation  4/5      Palpation   Palpation comment  TTP localized to Lt elbow medial and lateral condyles                   [facility] Adult PT Treatment/Exercise - 04/02/18 1808      Wrist Exercises   Wrist Flexion  Left;15 reps    Wrist Flexion Limitations  1    Wrist Extension  Left;15 reps    Bar Weights/Barbell (Wrist Extension)  1 lb    Wrist Extension Limitations  3" eccentric     Wrist Radial Deviation  Left;15 reps    Wrist Ulnar Deviation  Left;15 reps    Other wrist exercises  L wrist extensor stretch 3 x 30 sec       Cryotherapy   Number Minutes Cryotherapy  10 Minutes    Cryotherapy Location  Forearm    Type of Cryotherapy  Ice pack      Electrical Stimulation   Electrical Stimulation Location  Lt elbow & wrist extensor group    Electrical Stimulation Action   IFC    Electrical Stimulation Parameters  intensity to pt. tolerance, 10'    Electrical Stimulation Goals  Pain                  PT Long Term Goals - 04/02/18 1633      PT LONG TERM GOAL #1   Title  Independent w/ ongoing HEP. 4 weeks 04/02/18.    Status  Partially Met      PT LONG TERM GOAL #2   Title  Pt. will demonstrate an increase in Lt Elbow ROM to WNL to increase function. 4 weeks 04/02/18.    Status  Partially Met      PT LONG TERM GOAL #3   Title  Pt. will demonstrate increase in L elbow/wrist strength to 5/5 and Lt grip strength to >40 lbs to allow for increased elbow stability and functional use of arm. 4 weeks 04/02/18.    Status  On-going      PT LONG TERM GOAL #4   Title  Pt will improve FOTO to less than 40 % limited. 4 weeks 04/02/18.    Status  Partially Met      PT LONG TERM GOAL #5   Title  Pt will be able to perform her ususal ADL's and work duties with less than 2/10 overall pain. 4 weeks 06/02/17    Status  On-going            Plan - 04/02/18 1623    Clinical Impression Statement  Pt. has made progress with therapy.  Pt. noting her average pain at a constant 3/10 pain up to 5/10 pain at L elbow with work related tasks and daily tasks.  L elbow/wrist/forearm strength grossly 4/5 with some pain with max resisted MMT today.  Pt. has made good progress with L grip strength since starting therapy now 30-35# as  compared to R 55#.  Pt. L elbow ROM improved from AROM 0-90dg when first measured on eval. to 1-134 dg today with L elbow pain at end ranges of motion.  Pt. remains very ttp at L elbow and extensor bundle and reports pain remains with activities such as opening jars, opening doors, and carrying items in L hand.  Pt. to see MD in ~ 1 wk and pt. made aware that today is last visit in current POC and workers comp. approved therapy visits.  Supervising PT made aware of this.  Will wait to hear back from MD and pt. regarding further therapy after f/u.       PT Treatment/Interventions  Cryotherapy;Electrical Stimulation;Iontophoresis 67m/ml Dexamethasone;Moist Heat;Ultrasound;Therapeutic activities;Therapeutic exercise;Neuromuscular re-education;Manual techniques;Passive range of motion;Dry needling;Vasopneumatic Device;Joint Manipulations;Patient/family education;ADLs/Self Care Home Management    PT Next Visit Plan  f/u with MD after pt. visit ~ 11.26.19    Consulted and Agree with Plan of Care  Patient       Patient will benefit from skilled therapeutic intervention in order to improve the following deficits and impairments:  Decreased activity tolerance, Decreased endurance, Decreased range of motion, Decreased strength, Increased fascial restricitons, Pain  Visit Diagnosis: Pain in left elbow  Muscle weakness (generalized)     Problem List Patient Active Problem List   Diagnosis Date Noted  . Shingles 01/08/2018  . Menopause 01/08/2018  . Left ankle injury, subsequent encounter 08/04/2017  . PCP NOTES >>>>>>>>>> 07/11/2017  . 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 Chronic knee pain 04/18/2015  . Plantar fasciitis 04/18/2015  . Tobacco use disorder 03/15/2015  . [place]bid [place] 01/05/2015    MBess Harvest PTA 04/02/18 6:29 PM   [place] [address] [place] [address]Phone: [phone]  Fax:  [phone] Name: JTASMINE HIPWELLMRN: [id number] of Birth: [birth year] PHYSICAL THERAPY DISCHARGE SUMMARY  Visits from Start of Care: 7  Current functional level related to goals / functional outcomes:   Refer to above clinical impression for status as of last visit on 04/02/18. Pt was placed on hold pending follow up with worker's comp MD but no further communication received and pt has not returned to PT, therefore will proceed with discharge from PT for this episode.   Remaining deficits:   As above.   Education / Equipment:   HEP   Plan: Patient agrees to discharge.  Patient goals were partially met. Patient is being discharged due to not returning since the last visit.  ?????     JPercival Spanish PT, MPT 05/03/18, 8:35 AM  [facility] [place] [address]Phone: [phone]  Fax:  [phone]

## 2018-04-10 ENCOUNTER — Encounter: Payer: Self-pay | Admitting: Physical Therapy

## 2018-04-13 ENCOUNTER — Encounter

## 2018-04-23 ENCOUNTER — Ambulatory Visit (INDEPENDENT_AMBULATORY_CARE_PROVIDER_SITE_OTHER): Payer: No Typology Code available for payment source | Admitting: Internal Medicine

## 2018-04-23 ENCOUNTER — Encounter: Payer: Self-pay | Admitting: Internal Medicine

## 2018-04-23 VITALS — BP 126/74 | HR 74 | Temp 98.0°F | Resp 16 | Ht 64.0 in | Wt 235.1 lb

## 2018-04-23 DIAGNOSIS — R200 Anesthesia of skin: Secondary | ICD-10-CM | POA: Diagnosis not present

## 2018-04-23 NOTE — Patient Instructions (Signed)
Will refer you to neurology , please call if you don't hear from them in few days   Try a carpal tunnel brace at night on the R hand

## 2018-04-23 NOTE — Progress Notes (Signed)
Pre visit review using our clinic review tool, if applicable. No additional management support is needed unless otherwise documented below in the visit note. 

## 2018-04-23 NOTE — Progress Notes (Signed)
Subjective:    Patient ID: Ashley OatsJennifer S Franklin, female    DOB: 21-Jan-1971, 47 y.o.   MRN: [id number]  DOS:  04/23/2018 Type of visit - description : acute Symptoms of started approximately 3 months ago on and off and now they are persistent: the entire right hand from the wrist down  feels numb and tingly. Worse at night. She denies any neck pain, elbow or wrist pain. 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  Totally unrelated, she had a fall at work few weeks ago, injuring the left hand, did some physical therapy, still have some left-handed weakness persisting, will see a Worker's Comp. orthopedic doctor tomorrow.    Review of Systems   Past Medical History:  Diagnosis Date  . Anemia   . Anxiety   . Bipolar affective disorder (HCC)   . Blood transfusion without reported diagnosis   . Bronchospasm with bronchitis, acute   . Depression   . Kidney stone   . SBO (small bowel obstruction) (HCC) 01/2014    Past Surgical History:  Procedure Laterality Date  . CESAREAN SECTION    . ENDOMETRIAL ABLATION    . GASTRIC BYPASS  2001  . SBO  2015  . TUBAL LIGATION      Social History   Socioeconomic History  . Marital status: Married    Spouse name: Not on file  . Number of children: 2  . Years of education: Not on file  . Highest education level: Not on file  Occupational History  . Occupation: phelbotomist    Employer: [employer]  Social Needs  . Financial resource strain: Not on file  . Food insecurity:    Worry: Not on file    Inability: Not on file  . Transportation needs:    Medical: Not on file    Non-medical: Not on file  Tobacco Use  . Smoking status: Former Smoker    Packs/day: 0.50    Years: 6.00    Pack years: 3.00    Last attempt to quit: 05/11/2014    Years since quitting: 3.9  . Smokeless tobacco: Never Used  . Tobacco comment: 1/2 ppd   Substance and Sexual Activity  . Alcohol use: No    Alcohol/week: 0.0  standard drinks  . Drug use: No  . Sexual activity: Yes    Partners: Male    Birth control/protection: Surgical  Lifestyle  . Physical activity:    Days per week: Not on file    Minutes per session: Not on file  . Stress: Not on file  Relationships  . Social connections:    Talks on phone: Not on file    Gets together: Not on file    Attends religious service: Not on file    Active member of club or organization: Not on file    Attends meetings of clubs or organizations: Not on file    Relationship status: Not on file  . Intimate partner violence:    Fear of current or ex partner: Not on file    Emotionally abused: Not on file    Physically abused: Not on file    Forced sexual activity: Not on file  Other Topics Concern  . Not on file  Social History Narrative   The patient is a Water quality scientistphlebotomist at [employer]    1 son born 1988-08-13 daughter born in [id number] she is married and lives with her husband   Prior smoker  not current 3 caffeinated beverages a day no alcohol tobacco or drug use      2 independent teachers    Pharmacy schools, music teacher      Allergies as of 04/23/2018      Reactions   Nsaids Other (See Comments)   Contraindicated with gastric bypass      Medication List        Accurate as of 04/23/18 11:59 PM. Always use your most recent med list.          diazepam 5 MG tablet Commonly known as:  VALIUM Take 1 tablet (5 mg total) by mouth 2 (two) times daily as needed for anxiety.   divalproex 500 MG 24 hr tablet Commonly known as:  DEPAKOTE ER Take 4 tablets (2,000 mg total) by mouth at bedtime.   FLUoxetine 20 MG tablet Commonly known as:  PROZAC Take 4 tablets (80 mg total) by mouth daily.   lamoTRIgine 25 MG tablet Commonly known as:  LAMICTAL Take 2 tablets (50 mg total) by mouth daily.   linaclotide 290 MCG Caps capsule Commonly known as:  LINZESS Take 1 capsule (290 mcg total) by mouth daily before breakfast.   QUEtiapine 100 MG  tablet Commonly known as:  SEROQUEL Take 1 tablet (100 mg total) by mouth 2 (two) times daily.   QUEtiapine 200 MG tablet Commonly known as:  SEROQUEL Take 1 tablet (200 mg total) by mouth at bedtime.   varenicline 0.5 MG X 11 & 1 MG X 42 tablet Commonly known as:  CHANTIX PAK Take one 0.5 mg tablet by mouth once daily for 3 days, then increase to one 0.5 mg tablet twice daily for 4 days, then increase to one 1 mg tablet twice daily.   varenicline 1 MG tablet Commonly known as:  CHANTIX Take 1 tablet (1 mg total) by mouth 2 (two) times daily.   zolpidem 5 MG tablet Commonly known as:  AMBIEN Take 1 tablet (5 mg total) by mouth at bedtime.           Objective:   Physical Exam BP 126/74 (BP Location: Left Arm, Patient Position: Sitting, Cuff Size: Normal)   Pulse 74   Temp 98 F (36.7 C) (Oral)   Resp 16   Ht 5\' 4"  (1.626 m)   Wt 235 lb 2 oz (106.7 kg)   SpO2 98%   BMI 40.36 kg/m  General:   Well developed, NAD, BMI noted. HEENT:  Normocephalic . Face symmetric, atraumatic Neck: Full range of motion. MSK:  Hands, wrists, elbows: Normal to inspection and palpation except for mild TTP at the  palmar aspect of the right wrist.  Skin: Not pale. Not jaundice Neurologic:  alert & oriented X3.  Speech normal, gait appropriate for age and unassisted Motor and DTRs: Symmetric throughout. Psych--  Cognition and judgment appear intact.  Cooperative with normal attention span and concentration.  Behavior appropriate. No anxious or depressed appearing.      Assessment & Plan:     Assesment Bipolar, anxiety: per psych Dr Vanetta Shawl H/o kidney stones H/o SBO ~ 2015  Plan: Hand numbness, right: The symptoms are medial and lateral, atypical for CTS or cubital tunnel syndrome. Plan:  Refer to neurology, NCS?   Trial with CTS brace. See last couple of visits, cough/shortness of breath/night sweats: All resolved.

## 2018-04-24 NOTE — Assessment & Plan Note (Signed)
Hand numbness, right: The symptoms are medial and lateral, atypical for CTS or cubital tunnel syndrome. Plan:  Refer to neurology, NCS?   Trial with CTS brace. See last couple of visits, cough/shortness of breath/night sweats: All resolved.

## 2018-04-30 ENCOUNTER — Encounter: Payer: No Typology Code available for payment source | Admitting: Internal Medicine

## 2018-04-30 DIAGNOSIS — Z0289 Encounter for other administrative examinations: Secondary | ICD-10-CM

## 2018-04-30 MED FILL — ZOLPIDEM TARTRATE 5 MG TAB: 5 | 30 days supply | Qty: 30 | Fill #1

## 2018-05-02 ENCOUNTER — Encounter: Payer: Self-pay | Admitting: Internal Medicine

## 2018-05-02 ENCOUNTER — Telehealth: Payer: Self-pay | Admitting: Internal Medicine

## 2018-05-02 DIAGNOSIS — R32 Unspecified urinary incontinence: Secondary | ICD-10-CM

## 2018-05-02 NOTE — Telephone Encounter (Signed)
Please see patient's message. 1.  See if she can be seen sooner than by neurology February. 2.  Enter a UA, urine culture.  DX urinary incontinence

## 2018-05-03 NOTE — Telephone Encounter (Signed)
[name]- can you see if she can be seen sooner by neurology?

## 2018-05-07 NOTE — Telephone Encounter (Signed)
No sooner appts pt can call and be placed on the wait list.

## 2018-05-07 NOTE — Telephone Encounter (Signed)
Noted, thank you

## 2018-05-08 ENCOUNTER — Encounter (HOSPITAL_COMMUNITY): Payer: Self-pay | Admitting: Family Medicine

## 2018-05-08 ENCOUNTER — Emergency Department (HOSPITAL_COMMUNITY): Payer: No Typology Code available for payment source

## 2018-05-08 ENCOUNTER — Emergency Department (HOSPITAL_COMMUNITY)
Admission: EM | Admit: 2018-05-08 | Discharge: 2018-05-08 | Disposition: A | Discharge status: Home / Self Care | Payer: No Typology Code available for payment source | Attending: Emergency Medicine | Admitting: Emergency Medicine

## 2018-05-08 DIAGNOSIS — Y999 Unspecified external cause status: Secondary | ICD-10-CM | POA: Diagnosis not present

## 2018-05-08 DIAGNOSIS — Z79899 Other long term (current) drug therapy: Secondary | ICD-10-CM | POA: Insufficient documentation

## 2018-05-08 DIAGNOSIS — Y939 Activity, unspecified: Secondary | ICD-10-CM | POA: Diagnosis not present

## 2018-05-08 DIAGNOSIS — F17210 Nicotine dependence, cigarettes, uncomplicated: Secondary | ICD-10-CM | POA: Diagnosis not present

## 2018-05-08 DIAGNOSIS — Y92002 Bathroom of unspecified non-institutional (private) residence single-family (private) house as the place of occurrence of the external cause: Secondary | ICD-10-CM | POA: Diagnosis not present

## 2018-05-08 DIAGNOSIS — S92355A Nondisplaced fracture of fifth metatarsal bone, left foot, initial encounter for closed fracture: Secondary | ICD-10-CM | POA: Insufficient documentation

## 2018-05-08 DIAGNOSIS — W1830XA Fall on same level, unspecified, initial encounter: Secondary | ICD-10-CM | POA: Insufficient documentation

## 2018-05-08 DIAGNOSIS — S90922A Unspecified superficial injury of left foot, initial encounter: Secondary | ICD-10-CM | POA: Diagnosis present

## 2018-05-08 DIAGNOSIS — S92352A Displaced fracture of fifth metatarsal bone, left foot, initial encounter for closed fracture: Secondary | ICD-10-CM

## 2018-05-08 MED ORDER — HYDROCODONE-ACETAMINOPHEN 5-325 MG PO TABS: 1.0000 | ORAL_TABLET | ORAL | 0 refills | Status: DC | PRN

## 2018-05-08 MED ORDER — HYDROCODONE-ACETAMINOPHEN 5-325 MG PO TABS
1.0000 | ORAL_TABLET | Freq: Once | ORAL | Status: AC
  Administered 2018-05-08: 1 via ORAL
  Filled 2018-05-08: qty 1

## 2018-05-08 NOTE — ED Provider Notes (Signed)
[facility]-EMERGENCY DEPT Provider Note   CSN: [id number] Arrival date & time: 05/08/18  1733     History   Chief Complaint Chief Complaint  Patient presents with  . Fall    HPI Ashley OatsJennifer S Franklin is a 47 y.o. female who presents to the ED s/p fall. Patient reports she fell last night when she got up to go to the bathroom landing on her left foot and ankle. Difficulty with weight bearing since the injury. The pain has gotten wose. She has not taken anything for pain.  She denies injuries other than the foot and ankle.   HPI  Past Medical History:  Diagnosis Date  . Anemia   . Anxiety   . Bipolar affective disorder (HCC)   . Blood transfusion without reported diagnosis   . Bronchospasm with bronchitis, acute   . Depression   . Kidney stone   . SBO (small bowel obstruction) (HCC) 01/2014    Patient Active Problem List   Diagnosis Date Noted  . History of shingles 01/08/2018  . Menopause 01/08/2018  . Left ankle injury, subsequent encounter 08/04/2017  . PCP NOTES >>>>>>>>>> 07/11/2017  . 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 Chronic knee pain 04/18/2015  . Plantar fasciitis 04/18/2015  . Tobacco use disorder 03/15/2015  . Morbid obesity (HCC) 01/05/2015    Past Surgical History:  Procedure Laterality Date  . CESAREAN SECTION    . ENDOMETRIAL ABLATION    . GASTRIC BYPASS  2001  . SBO  2015     OB History    Gravida  2   Para  2   Term  2   Preterm      AB      Living  2     SAB      TAB      Ectopic      Multiple      Live Births  2            Home Medications    Prior to Admission medications   Medication Sig Start Date End Date Taking? Authorizing Provider  diazepam (VALIUM) 5 MG tablet Take 1 tablet (5 mg total) by mouth 2 (two) times daily as needed for anxiety. 04/01/18   Neysa HotterHisada, Reina, MD  divalproex (DEPAKOTE ER) 500 MG 24 hr tablet Take 4 tablets (2,000 mg total) by mouth at bedtime. 04/01/18   Neysa HotterHisada, Reina, MD    FLUoxetine (PROZAC) 20 MG tablet Take 4 tablets (80 mg total) by mouth daily. 04/01/18   Neysa HotterHisada, Reina, MD  HYDROcodone-acetaminophen (NORCO/VICODIN) 5-325 MG tablet Take 1 tablet by mouth every 4 (four) hours as needed. 05/08/18   Janne NapoleonNeese, Hope M, NP  lamoTRIgine (LAMICTAL) 25 MG tablet Take 2 tablets (50 mg total) by mouth daily. 04/01/18   Neysa HotterHisada, Reina, MD  linaclotide Karlene Einstein(LINZESS) 290 MCG CAPS capsule Take 1 capsule (290 mcg total) by mouth daily before breakfast. 08/29/17   Iva BoopGessner, Carl E, MD  QUEtiapine (SEROQUEL) 100 MG tablet Take 1 tablet (100 mg total) by mouth 2 (two) times daily. 04/01/18 06/30/18  Neysa HotterHisada, Reina, MD  QUEtiapine (SEROQUEL) 200 MG tablet Take 1 tablet (200 mg total) by mouth at bedtime. 04/01/18 06/30/18  Neysa HotterHisada, Reina, MD  varenicline (CHANTIX CONTINUING MONTH PAK) 1 MG tablet Take 1 tablet (1 mg total) by mouth 2 (two) times daily. 03/07/18   Wanda PlumpPaz, Jose E, MD  varenicline (CHANTIX STARTING MONTH PAK) 0.5 MG X 11 & 1 MG X 42 tablet Take one  0.5 mg tablet by mouth once daily for 3 days, then increase to one 0.5 mg tablet twice daily for 4 days, then increase to one 1 mg tablet twice daily. 03/07/18   Wanda Plump, MD  zolpidem (AMBIEN) 5 MG tablet Take 1 tablet (5 mg total) by mouth at bedtime. 04/01/18   [name], MD    Family History Family History  Problem Relation Age of Onset  . Diabetes Mother   . Cancer Mother 60       Throat   . Bipolar disorder Mother   . Bipolar disorder Sister   . Bipolar disorder Brother   . Drug abuse Brother   . Bipolar disorder Sister   . Colon cancer Neg Hx   . Cancer - Prostate Neg Hx   . Breast cancer Neg Hx     Social History Social History   Tobacco Use  . Smoking status: Current Every Day Smoker    Packs/day: 0.25    Years: 6.00    Pack years: 1.50  . Smokeless tobacco: Never Used  . Tobacco comment: 1/2 ppd   Substance Use Topics  . Alcohol use: Yes    Alcohol/week: 0.0 standard drinks    Comment: Two times a  week.   . Drug use: No     Allergies   Nsaids   Review of Systems Review of Systems  Musculoskeletal: Positive for arthralgias and joint swelling.  Skin: Negative for wound.  All other systems reviewed and are negative.    Physical Exam Updated Vital Signs BP 116/85 (BP Location: Left Arm)   Pulse 75   Temp 99.1 F (37.3 C) (Oral)   Ht 5\' 4"  (1.626 m)   Wt 104.3 kg   SpO2 97%   BMI 39.48 kg/m   Physical Exam Vitals signs and nursing note reviewed.  Constitutional:      General: She is not in acute distress.    Appearance: She is well-developed. She is obese.  HENT:     Head: Normocephalic.     Nose: Nose normal.  Eyes:     Conjunctiva/sclera: Conjunctivae normal.  Neck:     Musculoskeletal: Neck supple.  Cardiovascular:     Rate and Rhythm: Normal rate.  Pulmonary:     Effort: Pulmonary effort is normal.  Musculoskeletal:     Right foot: Decreased range of motion: due to pain. Tenderness and swelling present. No deformity or laceration.     Comments: Tender with palpation and range of motion to the lateral aspect of the right foot. Pedal pulse 2+, adequate circulation.   Skin:    General: Skin is warm and dry.  Neurological:     Mental Status: She is alert and oriented to person, place, and time.      ED Treatments / Results  Labs (all labs ordered are listed, but only abnormal results are displayed) Labs Reviewed - No data to display  EKG None  Radiology Dg Ankle Complete Left  Result Date: 05/08/2018 CLINICAL DATA:  Tripped and fell last night with left ankle and foot pain. EXAM: LEFT ANKLE COMPLETE - 3+ VIEW COMPARISON:  None. FINDINGS: Mild soft tissue swelling over the ankle. Ankle mortise is normal. Minimally displaced fracture of the base of the fifth metatarsal. Small inferior calcaneal spur. IMPRESSION: No acute ankle fracture. Minimally displaced fracture base of the fifth metatarsal. Electronically Signed   By: Elberta Fortis M.D.   On:  05/08/2018 18:22   Dg Foot Complete Left  Result Date: 05/08/2018 CLINICAL DATA:  Tripped and fell last night with left ankle/foot pain. EXAM: LEFT FOOT - COMPLETE 3+ VIEW COMPARISON:  None. FINDINGS: Exam demonstrates a minimally displaced fracture over the base of the fifth metatarsal. Small inferior calcaneal spur. Remainder the exam is unremarkable. IMPRESSION: Minimal displaced fracture base of the fifth metatarsal. Electronically Signed   By: Elberta Fortisaniel  Boyle M.D.   On: 05/08/2018 18:23    Procedures Procedures (including critical care time)  Medications Ordered in ED Medications  HYDROcodone-acetaminophen (NORCO/VICODIN) 5-325 MG per tablet 1 tablet (1 tablet Oral Given 05/08/18 1849)     Initial Impression / Assessment and Plan / ED Course  I have reviewed the triage vital signs and the nursing notes. 47 y.o. female here with left foot pain and swelling s/p fall last night stable for d/c without focal neuro deficits. X-ray shows fracture of the 5th MT of the left foot. Cam walker, crutches, RICE and f/u with ortho. Patient agrees with plan.   Final Clinical Impressions(s) / ED Diagnoses   Final diagnoses:  Closed fracture of base of fifth metatarsal bone of left foot, initial encounter    ED Discharge Orders         Ordered    HYDROcodone-acetaminophen (NORCO/VICODIN) 5-325 MG tablet  Every 4 hours PRN     05/08/18 1907           Kerrie Buffaloeese, Hope BeverlyM, NP 05/08/18 Windell [id number]    Gerhard MunchLockwood, Robert, MD 05/08/18 2232

## 2018-05-08 NOTE — ED Notes (Signed)
Ortho tech called 

## 2018-05-08 NOTE — ED Triage Notes (Signed)
Patient reports last night she tripped and landed on the left ankle/foot. Patient reports she is unable to bear weight on the foot this evening. It has progressively got worse since the fall. Denies any numbness and tingling. Denies taking any pain medication for symptoms.

## 2018-05-08 NOTE — Discharge Instructions (Addendum)
Do not drive while taking the narcotic as it will make you sleepy. Follow up with Dr. Pearletha ForgeHudnall. Return here as needed.

## 2018-05-09 MED FILL — HYDROCODON-APAP 5-325: 5-325 | 2 days supply | Qty: 10 | Fill #0

## 2018-05-10 ENCOUNTER — Encounter: Payer: Self-pay | Admitting: Family Medicine

## 2018-05-10 ENCOUNTER — Ambulatory Visit (INDEPENDENT_AMBULATORY_CARE_PROVIDER_SITE_OTHER): Payer: No Typology Code available for payment source | Admitting: Family Medicine

## 2018-05-10 VITALS — BP 124/74 | HR 80 | Temp 97.7°F | Ht 64.0 in | Wt 235.0 lb

## 2018-05-10 DIAGNOSIS — S92355A Nondisplaced fracture of fifth metatarsal bone, left foot, initial encounter for closed fracture: Secondary | ICD-10-CM

## 2018-05-10 NOTE — Patient Instructions (Addendum)
If you do not hear anything about your referral in the next week or so, call our office and ask for an update.  Ice/cold pack over area for 10-15 min twice daily.  OK to take Tylenol 1000 mg (2 extra strength tabs) or 975 mg (3 regular strength tabs) every 6 hours as needed.  Ibuprofen 400-600 mg (2-3 over the counter strength tabs) every 6 hours as needed for pain.  Let us know if you need anything.

## 2018-05-10 NOTE — Progress Notes (Signed)
Chief Complaint  Patient presents with  . Follow-up    left foot broken    Subjective: Patient is a 47 y.o. female here for ED f/u.  Broke L foot, base of 5th MT 3 d ago on 12/24. Was walking and foot got stuck. Having pain, swelling, some tingling. Using crutches, CAM walker too uncomfortable for heel. Needs primary care appt for referral per ins.    ROS: MSK: +L foot pain  Past Medical History:  Diagnosis Date  . Anemia   . Anxiety   . Bipolar affective disorder (HCC)   . Blood transfusion without reported diagnosis   . Bronchospasm with bronchitis, acute   . Depression   . Kidney stone   . SBO (small bowel obstruction) (HCC) 01/2014    Objective: BP 124/74 (BP Location: Left Arm, Patient Position: Sitting, Cuff Size: Large)   Pulse 80   Temp 97.7 F (36.5 C) (Oral)   Ht 5\' 4"  (1.626 m)   Wt 235 lb (106.6 kg)   SpO2 97%   BMI 40.34 kg/m  General: Awake, appears stated age Heart: brisk cap refill Lungs: No accessory muscle use MSK: +soft tissue edema; given known dx, I did not palpate the broken bone Neuro: Sensation intact to pinprick b/l Psych: Age appropriate judgment and insight, normal affect and mood  Assessment and Plan: Closed nondisplaced fracture of fifth metatarsal bone of left foot, initial encounter - Plan: Ambulatory referral to Sports Medicine  Flat soled shoe given. Refer to sports med. Tylenol, ibuprofen, ice, elevation, letter for work given. F/u w me prn.  The patient voiced understanding and agreement to the plan.  Jilda Rocheicholas Paul FayetteWendling, DO 05/10/18  8:52 AM

## 2018-05-10 NOTE — Progress Notes (Signed)
Pre visit review using our clinic review tool, if applicable. No additional management support is needed unless otherwise documented below in the visit note. 

## 2018-05-13 MED ORDER — HYDROCODONE-ACETAMINOPHEN 5-325 MG PO TABS: 1.0000 | ORAL_TABLET | ORAL | 0 refills | Status: DC | PRN

## 2018-05-13 MED FILL — HYDROCODON-APAP 5-325: 5-325 | 3 days supply | Qty: 10 | Fill #0

## 2018-05-13 MED FILL — diazePAM 5 MG TABS: 5 | 30 days supply | Qty: 60 | Fill #1

## 2018-05-14 ENCOUNTER — Encounter: Payer: Self-pay | Admitting: Family Medicine

## 2018-05-14 ENCOUNTER — Ambulatory Visit: Payer: No Typology Code available for payment source | Admitting: Family Medicine

## 2018-05-14 VITALS — BP 88/55 | HR 72 | Ht 64.0 in | Wt 230.0 lb

## 2018-05-14 DIAGNOSIS — S99922A Unspecified injury of left foot, initial encounter: Secondary | ICD-10-CM

## 2018-05-14 MED ORDER — HYDROCODONE-ACETAMINOPHEN 5-325 MG PO TABS: 1.0000 | ORAL_TABLET | Freq: Four times a day (QID) | ORAL | 0 refills | Status: DC | PRN

## 2018-05-14 NOTE — Patient Instructions (Signed)
You have an avulsion fracture of your fifth metatarsal. These heal extremely well with conservative treatment. Ice the area 15 minutes at a time 3-4 times a day. Elevate above your heart when possible. Boot when up and walking around. Expect it to be 6 weeks out when you feel completely back to normal. Aleve 2 tabs twice a day with food OR ibuprofen 600mg  three times a day with food as needed for pain and inflammation. Norco as needed for severe pain - no driving on this medication. Follow up with me just over a week from now to repeat x-rays.

## 2018-05-14 NOTE — Progress Notes (Signed)
PCP: Wanda Plump, MD Consultation requested by: Arva Chafe DO  Subjective:   HPI: Patient is a 47 y.o. female here for left foot injury.  Patient reports on December 24 she went to get up but her left foot was asleep and as she was walking to go to the bathroom she rolled her left ankle causing pain, swelling, bruising to the dorsal lateral aspect of her left foot and ankle. Pain is currently a 6-7 out of 10 and sharp. She is tried both a postop shoe and a cam walker and feels the cam walker is providing her the most relief. She takes Norco as needed for pain. Pain still worse with walking. No other skin changes and no numbness.  Past Medical History:  Diagnosis Date  . Anemia   . Anxiety   . Bipolar affective disorder (HCC)   . Blood transfusion without reported diagnosis   . Bronchospasm with bronchitis, acute   . Depression   . Kidney stone   . SBO (small bowel obstruction) (HCC) 01/2014    Current Outpatient Medications on File Prior to Visit  Medication Sig Dispense Refill  . diazepam (VALIUM) 5 MG tablet Take 1 tablet (5 mg total) by mouth 2 (two) times daily as needed for anxiety. 60 tablet 2  . divalproex (DEPAKOTE ER) 500 MG 24 hr tablet Take 4 tablets (2,000 mg total) by mouth at bedtime. 360 tablet 0  . FLUoxetine (PROZAC) 20 MG tablet Take 4 tablets (80 mg total) by mouth daily. 360 tablet 0  . lamoTRIgine (LAMICTAL) 25 MG tablet Take 2 tablets (50 mg total) by mouth daily. 180 tablet 0  . linaclotide (LINZESS) 290 MCG CAPS capsule Take 1 capsule (290 mcg total) by mouth daily before breakfast. 30 capsule 3  . QUEtiapine (SEROQUEL) 100 MG tablet Take 1 tablet (100 mg total) by mouth 2 (two) times daily. 180 tablet 0  . QUEtiapine (SEROQUEL) 200 MG tablet Take 1 tablet (200 mg total) by mouth at bedtime. 90 tablet 0  . varenicline (CHANTIX CONTINUING MONTH PAK) 1 MG tablet Take 1 tablet (1 mg total) by mouth 2 (two) times daily. 60 tablet 2  . varenicline  (CHANTIX STARTING MONTH PAK) 0.5 MG X 11 & 1 MG X 42 tablet Take one 0.5 mg tablet by mouth once daily for 3 days, then increase to one 0.5 mg tablet twice daily for 4 days, then increase to one 1 mg tablet twice daily. 53 tablet 0  . zolpidem (AMBIEN) 5 MG tablet Take 1 tablet (5 mg total) by mouth at bedtime. 30 tablet 2   No current facility-administered medications on file prior to visit.     Past Surgical History:  Procedure Laterality Date  . CESAREAN SECTION    . ENDOMETRIAL ABLATION    . GASTRIC BYPASS  2001  . SBO  2015    Allergies  Allergen Reactions  . Nsaids Other (See Comments)    Contraindicated with gastric bypass    Social History   Socioeconomic History  . Marital status: Married    Spouse name: Not on file  . Number of children: 2  . Years of education: Not on file  . Highest education level: Not on file  Occupational History  . Occupation: phelbotomist    Employer: [employer]  Social Needs  . Financial resource strain: Not on file  . Food insecurity:    Worry: Not on file    Inability: Not on file  . Transportation needs:  Medical: Not on file    Non-medical: Not on file  Tobacco Use  . Smoking status: Current Every Day Smoker    Packs/day: 0.25    Years: 6.00    Pack years: 1.50  . Smokeless tobacco: Never Used  . Tobacco comment: 1/2 ppd   Substance and Sexual Activity  . Alcohol use: Yes    Alcohol/week: 0.0 standard drinks    Comment: Two times a week.   . Drug use: No  . Sexual activity: Yes    Partners: Male    Birth control/protection: Surgical  Lifestyle  . Physical activity:    Days per week: Not on file    Minutes per session: Not on file  . Stress: Not on file  Relationships  . Social connections:    Talks on phone: Not on file    Gets together: Not on file    Attends religious service: Not on file    Active member of club or organization: Not on file    Attends meetings of clubs or organizations: Not on file     Relationship status: Not on file  . Intimate partner violence:    Fear of current or ex partner: Not on file    Emotionally abused: Not on file    Physically abused: Not on file    Forced sexual activity: Not on file  Other Topics Concern  . Not on file  Social History Narrative   The patient is a Water quality scientistphlebotomist at [employer]    1 son born 1988-08-13 daughter born in [address]1995 she is married and lives with her husband   Prior smoker not current 3 caffeinated beverages a day no alcohol tobacco or drug use      2 independent teachers    Pharmacy schools, music teacher    Family History  Problem Relation Age of Onset  . Diabetes Mother   . Cancer Mother 60       Throat   . Bipolar disorder Mother   . Bipolar disorder Sister   . Bipolar disorder Brother   . Drug abuse Brother   . Bipolar disorder Sister   . Colon cancer Neg Hx   . Cancer - Prostate Neg Hx   . Breast cancer Neg Hx     BP (!) 88/55   Pulse 72   Ht 5\' 4"  (1.626 m)   Wt 230 lb (104.3 kg)   BMI 39.48 kg/m   Review of Systems: See HPI above.     Objective:  Physical Exam:  Gen: NAD, comfortable in exam room  Left foot/ankle: Mod bruising and swelling dorsolateral foot.  No other gross deformity. Minimal ROM but able to flex and extend digits with good strength. TTP greatest over base 5th metatarsal but tender entire dorsum foot. Negative syndesmotic compression. Thompsons test negative. NV intact distally.  Right foot/ankle: No deformity. FROM with 5/5 strength. No tenderness to palpation. NVI distally.   Assessment & Plan:  1. Left foot injury -independently reviewed radiographs noting avulsion fracture of her fifth metatarsal.  She will continue with the Cam walker, ice and elevate this.  She will take Norco as needed for severe pain.  I asked if she could take Aleve or ibuprofen and she stated yes but recall she has had gastric bypass surgery so we will have to take this sparingly.  Expect this to  heal extremely well with conservative treatment over the course of 6 weeks.  She will follow-up when she is about  2 weeks out to repeat radiographs.

## 2018-05-16 ENCOUNTER — Encounter

## 2018-05-16 ENCOUNTER — Ambulatory Visit: Payer: Self-pay | Admitting: Family Medicine

## 2018-05-22 ENCOUNTER — Ambulatory Visit: Payer: No Typology Code available for payment source | Admitting: Family Medicine

## 2018-05-22 ENCOUNTER — Encounter: Payer: Self-pay | Admitting: Family Medicine

## 2018-05-22 ENCOUNTER — Encounter (HOSPITAL_BASED_OUTPATIENT_CLINIC_OR_DEPARTMENT_OTHER): Payer: Self-pay

## 2018-05-22 ENCOUNTER — Ambulatory Visit (HOSPITAL_BASED_OUTPATIENT_CLINIC_OR_DEPARTMENT_OTHER)
Admission: RE | Admit: 2018-05-22 | Discharge: 2018-05-22 | Disposition: A | Discharge status: Home / Self Care | Payer: No Typology Code available for payment source | Source: Ambulatory Visit | Attending: Internal Medicine | Admitting: Internal Medicine

## 2018-05-22 ENCOUNTER — Ambulatory Visit (HOSPITAL_BASED_OUTPATIENT_CLINIC_OR_DEPARTMENT_OTHER)
Admission: RE | Admit: 2018-05-22 | Discharge: 2018-05-22 | Disposition: A | Discharge status: Home / Self Care | Payer: No Typology Code available for payment source | Source: Ambulatory Visit | Attending: Family Medicine | Admitting: Family Medicine

## 2018-05-22 VITALS — BP 91/63 | HR 72 | Ht 64.0 in | Wt 230.0 lb

## 2018-05-22 DIAGNOSIS — R9389 Abnormal findings on diagnostic imaging of other specified body structures: Secondary | ICD-10-CM | POA: Diagnosis present

## 2018-05-22 DIAGNOSIS — M79672 Pain in left foot: Secondary | ICD-10-CM | POA: Diagnosis not present

## 2018-05-22 DIAGNOSIS — S99912D Unspecified injury of left ankle, subsequent encounter: Secondary | ICD-10-CM | POA: Diagnosis not present

## 2018-05-22 MED ORDER — HYDROCODONE-ACETAMINOPHEN 5-325 MG PO TABS: 1.0000 | ORAL_TABLET | Freq: Four times a day (QID) | ORAL | 0 refills | Status: DC | PRN

## 2018-05-22 MED FILL — HYDROCODON-APAP 5-325: 5-325 | 5 days supply | Qty: 20 | Fill #0

## 2018-05-22 NOTE — Progress Notes (Signed)
PCP: Wanda Plump, MD Consultation requested by: Arva Chafe DO  Subjective:   HPI: Patient is a 48 y.o. female here for left foot injury.  12/31: Patient reports on December 24 she went to get up but her left foot was asleep and as she was walking to go to the bathroom she rolled her left ankle causing pain, swelling, bruising to the dorsal lateral aspect of her left foot and ankle. Pain is currently a 6-7 out of 10 and sharp. She is tried both a postop shoe and a cam walker and feels the cam walker is providing her the most relief. She takes Norco as needed for pain. Pain still worse with walking. No other skin changes and no numbness.  05/22/18: Patient reports she's improved since last visit. Pain down to 4/10 - flared up when she tried to take some christmas decorations down. Wearing boot when up and around. Has been elevating, icing, taking norco at bedtime. No skin changes.  Past Medical History:  Diagnosis Date  . Anemia   . Anxiety   . Bipolar affective disorder (HCC)   . Blood transfusion without reported diagnosis   . Bronchospasm with bronchitis, acute   . Depression   . Kidney stone   . SBO (small bowel obstruction) (HCC) 01/2014    Current Outpatient Medications on File Prior to Visit  Medication Sig Dispense Refill  . diazepam (VALIUM) 5 MG tablet Take 1 tablet (5 mg total) by mouth 2 (two) times daily as needed for anxiety. 60 tablet 2  . divalproex (DEPAKOTE ER) 500 MG 24 hr tablet Take 4 tablets (2,000 mg total) by mouth at bedtime. 360 tablet 0  . FLUoxetine (PROZAC) 20 MG tablet Take 4 tablets (80 mg total) by mouth daily. 360 tablet 0  . lamoTRIgine (LAMICTAL) 25 MG tablet Take 2 tablets (50 mg total) by mouth daily. 180 tablet 0  . linaclotide (LINZESS) 290 MCG CAPS capsule Take 1 capsule (290 mcg total) by mouth daily before breakfast. 30 capsule 3  . QUEtiapine (SEROQUEL) 100 MG tablet Take 1 tablet (100 mg total) by mouth 2 (two) times daily. 180  tablet 0  . QUEtiapine (SEROQUEL) 200 MG tablet Take 1 tablet (200 mg total) by mouth at bedtime. 90 tablet 0  . varenicline (CHANTIX CONTINUING MONTH PAK) 1 MG tablet Take 1 tablet (1 mg total) by mouth 2 (two) times daily. 60 tablet 2  . varenicline (CHANTIX STARTING MONTH PAK) 0.5 MG X 11 & 1 MG X 42 tablet Take one 0.5 mg tablet by mouth once daily for 3 days, then increase to one 0.5 mg tablet twice daily for 4 days, then increase to one 1 mg tablet twice daily. 53 tablet 0  . zolpidem (AMBIEN) 5 MG tablet Take 1 tablet (5 mg total) by mouth at bedtime. 30 tablet 2   No current facility-administered medications on file prior to visit.     Past Surgical History:  Procedure Laterality Date  . CESAREAN SECTION    . ENDOMETRIAL ABLATION    . GASTRIC BYPASS  2001  . SBO  2015    Allergies  Allergen Reactions  . Nsaids Other (See Comments)    Contraindicated with gastric bypass    Social History   Socioeconomic History  . Marital status: Married    Spouse name: Not on file  . Number of children: 2  . Years of education: Not on file  . Highest education level: Not on file  Occupational History  .  Occupation: phelbotomist    Employer: [employer]  Social Needs  . Financial resource strain: Not on file  . Food insecurity:    Worry: Not on file    Inability: Not on file  . Transportation needs:    Medical: Not on file    Non-medical: Not on file  Tobacco Use  . Smoking status: Current Every Day Smoker    Packs/day: 0.25    Years: 6.00    Pack years: 1.50  . Smokeless tobacco: Never Used  . Tobacco comment: 1/2 ppd   Substance and Sexual Activity  . Alcohol use: Yes    Alcohol/week: 0.0 standard drinks    Comment: Two times a week.   . Drug use: No  . Sexual activity: Yes    Partners: Male    Birth control/protection: Surgical  Lifestyle  . Physical activity:    Days per week: Not on file    Minutes per session: Not on file  . Stress: Not on file  Relationships   . Social connections:    Talks on phone: Not on file    Gets together: Not on file    Attends religious service: Not on file    Active member of club or organization: Not on file    Attends meetings of clubs or organizations: Not on file    Relationship status: Not on file  . Intimate partner violence:    Fear of current or ex partner: Not on file    Emotionally abused: Not on file    Physically abused: Not on file    Forced sexual activity: Not on file  Other Topics Concern  . Not on file  Social History Narrative   The patient is a Water quality scientist at [employer]    1 son born 1988-08-13 daughter born in 56 she is married and lives with her husband   Prior smoker not current 3 caffeinated beverages a day no alcohol tobacco or drug use      2 independent teachers    Pharmacy schools, music teacher    Family History  Problem Relation Age of Onset  . Diabetes Mother   . Cancer Mother 60       Throat   . Bipolar disorder Mother   . Bipolar disorder Sister   . Bipolar disorder Brother   . Drug abuse Brother   . Bipolar disorder Sister   . Colon cancer Neg Hx   . Cancer - Prostate Neg Hx   . Breast cancer Neg Hx     BP 91/63   Pulse 72   Ht 5\' 4"  (1.626 m)   Wt 230 lb (104.3 kg)   BMI 39.48 kg/m   Review of Systems: See HPI above.     Objective:  Physical Exam:  Gen: NAD, comfortable in exam room  Left foot/ankle: Mild lateral swelling of foot.  Bruising into 3rd-5th digits.  No other deformity. Able to move in all directions, strength not tested with known fracture.  FROM digits with 5/5 strength. TTP base 5th metatarsal.  No other tenderness. Thompsons test negative. NV intact distally.   Assessment & Plan:  1. Left foot injury - Independently reviewed radiographs noting minimal change - some minimal displacement as is expected with avulsion type fracture but should heal well with conservative treatment.  Continue boot.  Aleve or ibuprofen with norco as  needed.  Elevation, icing.  F/u in 4 weeks for reevaluation.

## 2018-05-22 NOTE — Patient Instructions (Signed)
You have an avulsion fracture of your fifth metatarsal. These heal extremely well with conservative treatment. Ice the area 15 minutes at a time 3-4 times a day. Elevate above your heart when possible. Boot when up and walking around. Expect it to be 4 more weeks until you feel completely back to normal. Aleve 2 tabs twice a day with food OR ibuprofen 600mg  three times a day with food as needed for pain and inflammation. Norco as needed for severe pain - no driving on this medication. Follow up with me in 4 weeks.

## 2018-05-30 MED FILL — ZOLPIDEM TARTRATE 5 MG TAB: 5 | 30 days supply | Qty: 30 | Fill #2

## 2018-06-17 MED FILL — CHANTIX 1 MG TABLET: 1 | 28 days supply | Qty: 56 | Fill #0

## 2018-06-19 ENCOUNTER — Ambulatory Visit: Payer: No Typology Code available for payment source | Admitting: Family Medicine

## 2018-06-19 ENCOUNTER — Encounter: Payer: Self-pay | Admitting: Family Medicine

## 2018-06-19 ENCOUNTER — Ambulatory Visit (HOSPITAL_BASED_OUTPATIENT_CLINIC_OR_DEPARTMENT_OTHER)
Admission: RE | Admit: 2018-06-19 | Discharge: 2018-06-19 | Disposition: A | Discharge status: Home / Self Care | Payer: No Typology Code available for payment source | Source: Ambulatory Visit | Attending: Family Medicine | Admitting: Family Medicine

## 2018-06-19 VITALS — BP 109/79 | HR 88 | Ht 64.0 in | Wt 230.0 lb

## 2018-06-19 DIAGNOSIS — M79672 Pain in left foot: Secondary | ICD-10-CM | POA: Insufficient documentation

## 2018-06-19 MED FILL — HYDROCODON-APAP 5-325: 5-325 | 5 days supply | Qty: 20 | Fill #0

## 2018-06-19 NOTE — Patient Instructions (Signed)
You're doing great! Stop by when you can to have [place] place metatarsal pads in sports insoles. Start at 1/2 mile walking on treadmill in running/tennis shoes and increase from there. Icing, tylenol, elevation if needed. Stop using the boot at this point unless absolutely necessary. Follow up with me in 2 weeks or as needed.

## 2018-06-21 ENCOUNTER — Encounter: Payer: Self-pay | Admitting: Family Medicine

## 2018-06-21 NOTE — Progress Notes (Signed)
PCP: Wanda Plump, MD Consultation requested by: Arva Chafe DO  Subjective:   HPI: Patient is a 48 y.o. female here for left foot injury.  12/31: Patient reports on December 24 she went to get up but her left foot was asleep and as she was walking to go to the bathroom she rolled her left ankle causing pain, swelling, bruising to the dorsal lateral aspect of her left foot and ankle. Pain is currently a 6-7 out of 10 and sharp. She is tried both a postop shoe and a cam walker and feels the cam walker is providing her the most relief. She takes Norco as needed for pain. Pain still worse with walking. No other skin changes and no numbness.  05/22/18: Patient reports she's improved since last visit. Pain down to 4/10 - flared up when she tried to take some christmas decorations down. Wearing boot when up and around. Has been elevating, icing, taking norco at bedtime. No skin changes.  2/5: Patient reports she is doing well with 0 out of 10 pain. She is wearing the boot regularly. She still having pain at times dorsally but more between the third and fourth digits radiating more proximal. No skin changes or numbness.  Past Medical History:  Diagnosis Date  . Anemia   . Anxiety   . Bipolar affective disorder (HCC)   . Blood transfusion without reported diagnosis   . Bronchospasm with bronchitis, acute   . Depression   . Kidney stone   . SBO (small bowel obstruction) (HCC) 01/2014    Current Outpatient Medications on File Prior to Visit  Medication Sig Dispense Refill  . diazepam (VALIUM) 5 MG tablet Take 1 tablet (5 mg total) by mouth 2 (two) times daily as needed for anxiety. 60 tablet 2  . divalproex (DEPAKOTE ER) 500 MG 24 hr tablet Take 4 tablets (2,000 mg total) by mouth at bedtime. 360 tablet 0  . FLUoxetine (PROZAC) 20 MG tablet Take 4 tablets (80 mg total) by mouth daily. 360 tablet 0  . HYDROcodone-acetaminophen (NORCO/VICODIN) 5-325 MG tablet Take 1 tablet by  mouth every 6 (six) hours as needed. 20 tablet 0  . lamoTRIgine (LAMICTAL) 25 MG tablet Take 2 tablets (50 mg total) by mouth daily. 180 tablet 0  . linaclotide (LINZESS) 290 MCG CAPS capsule Take 1 capsule (290 mcg total) by mouth daily before breakfast. 30 capsule 3  . QUEtiapine (SEROQUEL) 100 MG tablet Take 1 tablet (100 mg total) by mouth 2 (two) times daily. 180 tablet 0  . QUEtiapine (SEROQUEL) 200 MG tablet Take 1 tablet (200 mg total) by mouth at bedtime. 90 tablet 0  . varenicline (CHANTIX CONTINUING MONTH PAK) 1 MG tablet Take 1 tablet (1 mg total) by mouth 2 (two) times daily. 60 tablet 2  . varenicline (CHANTIX STARTING MONTH PAK) 0.5 MG X 11 & 1 MG X 42 tablet Take one 0.5 mg tablet by mouth once daily for 3 days, then increase to one 0.5 mg tablet twice daily for 4 days, then increase to one 1 mg tablet twice daily. 53 tablet 0  . zolpidem (AMBIEN) 5 MG tablet Take 1 tablet (5 mg total) by mouth at bedtime. 30 tablet 2   No current facility-administered medications on file prior to visit.     Past Surgical History:  Procedure Laterality Date  . CESAREAN SECTION    . ENDOMETRIAL ABLATION    . GASTRIC BYPASS  2001  . SBO  2015  Allergies  Allergen Reactions  . Nsaids Other (See Comments)    Contraindicated with gastric bypass    Social History   Socioeconomic History  . Marital status: Married    Spouse name: Not on file  . Number of children: 2  . Years of education: Not on file  . Highest education level: Not on file  Occupational History  . Occupation: phelbotomist    Employer: [employer]  Social Needs  . Financial resource strain: Not on file  . Food insecurity:    Worry: Not on file    Inability: Not on file  . Transportation needs:    Medical: Not on file    Non-medical: Not on file  Tobacco Use  . Smoking status: Current Every Day Smoker    Packs/day: 0.25    Years: 6.00    Pack years: 1.50  . Smokeless tobacco: Never Used  . Tobacco comment:  1/2 ppd   Substance and Sexual Activity  . Alcohol use: Yes    Alcohol/week: 0.0 standard drinks    Comment: Two times a week.   . Drug use: No  . Sexual activity: Yes    Partners: Male    Birth control/protection: Surgical  Lifestyle  . Physical activity:    Days per week: Not on file    Minutes per session: Not on file  . Stress: Not on file  Relationships  . Social connections:    Talks on phone: Not on file    Gets together: Not on file    Attends religious service: Not on file    Active member of club or organization: Not on file    Attends meetings of clubs or organizations: Not on file    Relationship status: Not on file  . Intimate partner violence:    Fear of current or ex partner: Not on file    Emotionally abused: Not on file    Physically abused: Not on file    Forced sexual activity: Not on file  Other Topics Concern  . Not on file  Social History Narrative   The patient is a Water quality scientistphlebotomist at [employer]    1 son born 1988-08-13 daughter born in [id number] she is married and lives with her husband   Prior smoker not current 3 caffeinated beverages a day no alcohol tobacco or drug use      2 independent teachers    Pharmacy schools, music teacher    Family History  Problem Relation Age of Onset  . Diabetes Mother   . Cancer Mother 60       Throat   . Bipolar disorder Mother   . Bipolar disorder Sister   . Bipolar disorder Brother   . Drug abuse Brother   . Bipolar disorder Sister   . Colon cancer Neg Hx   . Cancer - Prostate Neg Hx   . Breast cancer Neg Hx     BP 109/79   Pulse 88   Ht 5\' 4"  (1.626 m)   Wt 230 lb (104.3 kg)   BMI 39.48 kg/m   Review of Systems: See HPI above.     Objective:  Physical Exam:  Gen: NAD, comfortable in exam room  Left foot/ankle: Minimal dorsolateral swelling of foot.  No other gross deformity, swelling, ecchymoses FROM with 5/5 strength all directions, minimal pain ER. TTP proximal 4th and 5th metatarsals.   No other tenderness. Negative ant drawer and talar tilt.   Thompsons test negative. NV intact distally.  Assessment & Plan:  1. Left foot injury -independently reviewed repeat radiographs and noted interval healing with callus especially noted on the lateral view.  Ultrasound with evidence of callus proximal fifth metatarsal on the more lateral aspect as well.  Clinically she is improving as well.  Advised that she stop by early next week to have sports insoles fashioned with metatarsal pads to help with cushion and with her transverse arch collapse.  We discussed slowly increasing her endurance and walking.  Icing, Tylenol, elevation as needed.  Follow-up in 2 weeks or as needed.

## 2018-06-24 ENCOUNTER — Ambulatory Visit: Payer: No Typology Code available for payment source | Admitting: Family Medicine

## 2018-06-25 MED FILL — diazePAM 5 MG TABS: 5 | 30 days supply | Qty: 60 | Fill #2
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
## 2018-06-26 ENCOUNTER — Ambulatory Visit: Payer: Self-pay | Admitting: Family Medicine

## 2018-07-01 ENCOUNTER — Telehealth: Payer: Self-pay | Admitting: Neurology

## 2018-07-01 ENCOUNTER — Ambulatory Visit: Payer: No Typology Code available for payment source | Admitting: Neurology

## 2018-07-01 DIAGNOSIS — Z0289 Encounter for other administrative examinations: Secondary | ICD-10-CM

## 2018-07-01 NOTE — Telephone Encounter (Signed)
This patient did not show up for a new patient appointment today. 

## 2018-07-02 ENCOUNTER — Encounter: Payer: Self-pay | Admitting: Neurology

## 2018-07-02 ENCOUNTER — Other Ambulatory Visit: Payer: Self-pay | Admitting: Internal Medicine

## 2018-07-02 ENCOUNTER — Ambulatory Visit: Payer: No Typology Code available for payment source | Admitting: Psychology

## 2018-07-02 ENCOUNTER — Ambulatory Visit (HOSPITAL_COMMUNITY): Payer: No Typology Code available for payment source | Admitting: Psychiatry

## 2018-07-02 ENCOUNTER — Encounter (HOSPITAL_COMMUNITY): Payer: Self-pay | Admitting: Psychiatry

## 2018-07-02 DIAGNOSIS — F411 Generalized anxiety disorder: Secondary | ICD-10-CM | POA: Diagnosis not present

## 2018-07-02 DIAGNOSIS — F3131 Bipolar disorder, current episode depressed, mild: Secondary | ICD-10-CM | POA: Diagnosis not present

## 2018-07-02 DIAGNOSIS — F331 Major depressive disorder, recurrent, moderate: Secondary | ICD-10-CM | POA: Diagnosis not present

## 2018-07-02 MED ORDER — LURASIDONE HCL 20 MG PO TABS: 20.0000 mg | ORAL_TABLET | Freq: Every day | ORAL | 1 refills | Status: DC

## 2018-07-02 MED ORDER — DIAZEPAM 5 MG PO TABS: 5.0000 mg | ORAL_TABLET | Freq: Two times a day (BID) | ORAL | 1 refills | Status: DC | PRN

## 2018-07-02 MED ORDER — ZOLPIDEM TARTRATE 5 MG PO TABS: 5.0000 mg | ORAL_TABLET | Freq: Every day | ORAL | 1 refills | Status: DC

## 2018-07-02 MED ORDER — FLUOXETINE HCL 20 MG PO TABS: 80.0000 mg | ORAL_TABLET | Freq: Every day | ORAL | 0 refills | Status: DC

## 2018-07-02 MED ORDER — DIVALPROEX SODIUM ER 500 MG PO TB24: 2000.0000 mg | ORAL_TABLET | Freq: Every day | ORAL | 0 refills | Status: DC

## 2018-07-02 MED ORDER — LAMOTRIGINE 25 MG PO TABS: 50.0000 mg | ORAL_TABLET | Freq: Every day | ORAL | 0 refills | Status: DC

## 2018-07-02 MED ORDER — VARENICLINE TARTRATE 1 MG PO TABS: 1.0000 mg | ORAL_TABLET | Freq: Two times a day (BID) | ORAL | 2 refills | Status: DC

## 2018-07-02 MED FILL — FLUoxetine HCL 20 MG CAPS: 20 | 90 days supply | Qty: 360 | Fill #0

## 2018-07-02 MED FILL — LATUDA 20 MG TABLET: 20 | 30 days supply | Qty: 30 | Fill #0

## 2018-07-02 MED FILL — DIVALPROEX SOD ER 500 MG TA: 500 | 90 days supply | Qty: 360 | Fill #0

## 2018-07-02 MED FILL — lamoTRIgine 25 MG TABS: 25 | 90 days supply | Qty: 180 | Fill #0

## 2018-07-02 MED FILL — ZOLPIDEM TARTRATE 5 MG TAB: 5 | 30 days supply | Qty: 30 | Fill #0

## 2018-07-02 NOTE — Patient Instructions (Signed)
1.Continue Depakote 2000 mg at night  3. Continue fluoxetine 80 mg daily  4. Continue lamotrigine 50 mg daily 5. Discontinue quetiapine  6. Start latuda 20 mg daily  7. Continue diazepam 5 mg twice a day as needed for anxiety  8. ContinueAmbien5 mg at night as needed for sleep 9. Return to clinic in two months for 30 mins

## 2018-07-16 ENCOUNTER — Ambulatory Visit: Payer: No Typology Code available for payment source | Admitting: Psychology

## 2018-07-16 DIAGNOSIS — F331 Major depressive disorder, recurrent, moderate: Secondary | ICD-10-CM | POA: Diagnosis not present

## 2018-07-16 DIAGNOSIS — F411 Generalized anxiety disorder: Secondary | ICD-10-CM | POA: Diagnosis not present

## 2018-07-16 DIAGNOSIS — F429 Obsessive-compulsive disorder, unspecified: Secondary | ICD-10-CM

## 2018-07-23 MED FILL — diazePAM 5 MG TABS: 5 | 30 days supply | Qty: 60 | Fill #0

## 2018-07-25 ENCOUNTER — Encounter: Payer: Self-pay | Admitting: Internal Medicine

## 2018-07-25 DIAGNOSIS — Z0189 Encounter for other specified special examinations: Secondary | ICD-10-CM

## 2018-07-29 ENCOUNTER — Ambulatory Visit: Payer: No Typology Code available for payment source | Admitting: Psychology

## 2018-07-30 ENCOUNTER — Other Ambulatory Visit: Payer: Self-pay

## 2018-07-30 ENCOUNTER — Ambulatory Visit: Payer: No Typology Code available for payment source | Admitting: Psychology

## 2018-07-30 DIAGNOSIS — F411 Generalized anxiety disorder: Secondary | ICD-10-CM

## 2018-07-30 DIAGNOSIS — F331 Major depressive disorder, recurrent, moderate: Secondary | ICD-10-CM | POA: Diagnosis not present

## 2018-07-30 MED FILL — CHANTIX 1 MG TABLET: 1 | 28 days supply | Qty: 56 | Fill #0

## 2018-07-30 MED FILL — ZOLPIDEM TARTRATE 5 MG TAB: 5 | 30 days supply | Qty: 30 | Fill #1

## 2018-08-01 ENCOUNTER — Other Ambulatory Visit (HOSPITAL_COMMUNITY): Payer: Self-pay | Admitting: Psychiatry

## 2018-08-01 MED ORDER — LURASIDONE HCL 40 MG PO TABS: 40.0000 mg | ORAL_TABLET | Freq: Every day | ORAL | 1 refills | Status: DC

## 2018-08-01 MED ORDER — DIAZEPAM 10 MG PO TABS: 15.0000 mg | ORAL_TABLET | Freq: Three times a day (TID) | ORAL | 0 refills | Status: DC | PRN

## 2018-08-01 MED FILL — LATUDA 40 MG TABLET: 40 | 30 days supply | Qty: 30 | Fill #0

## 2018-08-01 MED FILL — DIAZEPAM 10 MG TABS: 10 | 90 days supply | Qty: 135 | Fill #0

## 2018-08-01 NOTE — Telephone Encounter (Signed)
Patient contacted the front desk to see if she can come to the clinic due to worsening in anxiety.  Discussed with the patient.  She is concerned about potential exposure to the patient, who may have COVID 19 at work.  She has been feeling very anxious, and has been taking Valium up to 15 mg 3 times a day.  She denies any drowsiness after taking Valium.  She also lost 2 other jobs and her son also lost his job due to COVID 19 pandemic. She feels anxious, tense and she has hand tremors. She reports good response to latuda after starting this medication; feels more stable without hypomanic symptoms or depressive symptoms. The following is recommended.   - Increase latuda 40 mg daily  - Increase Valium 15 mg TID prn for anxiety (the dose she has been taking); order sent to allow for sooner refill - She is advised not to come to the clinic at this time given potential exposure to COVID 19. She is advised to contact the office if she has worsening in symptoms/side effect from medication.

## 2018-08-02 ENCOUNTER — Telehealth: Payer: Self-pay

## 2018-08-02 NOTE — Telephone Encounter (Signed)
I called pt, left a message on her cell phone advising her that [facility] is closed on Monday and to call with questions or concerns.

## 2018-08-05 ENCOUNTER — Institutional Professional Consult (permissible substitution): Payer: No Typology Code available for payment source | Admitting: Neurology

## 2018-08-13 ENCOUNTER — Ambulatory Visit (INDEPENDENT_AMBULATORY_CARE_PROVIDER_SITE_OTHER): Payer: No Typology Code available for payment source | Admitting: Psychology

## 2018-08-13 DIAGNOSIS — F331 Major depressive disorder, recurrent, moderate: Secondary | ICD-10-CM | POA: Diagnosis not present

## 2018-08-13 DIAGNOSIS — F411 Generalized anxiety disorder: Secondary | ICD-10-CM | POA: Diagnosis not present

## 2018-08-14 ENCOUNTER — Telehealth: Payer: Self-pay

## 2018-08-14 NOTE — Telephone Encounter (Signed)
I contacted the pt in regards to her new pt appt scheduled for 08/19/18 at 3 pm. I advise, Due to current COVID 19 pandemic, our office is severely reducing in office visits for at least the next 2 weeks, in order to minimize the risk to our patients and healthcare providers.   Pt was offered a video visit at the same time of scheduled appt and pt agreed.   Pt understands that although there may be some limitations with this type of visit, we will take all precautions to reduce any security or privacy concerns.  Pt understands that this will be treated like an in office visit and we will file with pt's insurance, and there may be a patient responsible charge related to this service.  Pt's email is [email or web address]. Pt understands that the cisco webex software must be downloaded and operational on the device pt plans to use for the visit.  I reviewed medications, allergies, pharmacy and history with the pt.

## 2018-08-19 ENCOUNTER — Ambulatory Visit (INDEPENDENT_AMBULATORY_CARE_PROVIDER_SITE_OTHER): Payer: No Typology Code available for payment source | Admitting: Neurology

## 2018-08-19 ENCOUNTER — Encounter: Payer: Self-pay | Admitting: Neurology

## 2018-08-19 ENCOUNTER — Other Ambulatory Visit: Payer: Self-pay

## 2018-08-19 ENCOUNTER — Telehealth: Payer: Self-pay | Admitting: Neurology

## 2018-08-19 DIAGNOSIS — G5601 Carpal tunnel syndrome, right upper limb: Secondary | ICD-10-CM | POA: Diagnosis not present

## 2018-08-19 MED ORDER — GABAPENTIN 300 MG PO CAPS: 300.0000 mg | ORAL_CAPSULE | Freq: Every day | ORAL | 3 refills | Status: DC

## 2018-08-19 MED FILL — GABAPENTIN 300 MG CAPSULE: 300 | 30 days supply | Qty: 30 | Fill #0

## 2018-08-19 NOTE — Telephone Encounter (Signed)
Due to current COVID 19 pandemic, our office is severely reducing in office visits for at least the next 2 weeks, in order to minimize the risk to our patients and healthcare providers. Our staff will contact you for next steps.  Pt understands that although there may be some limitations with this type of visit, we will take all precautions to reduce any security or privacy concerns.  Pt understands that this will be treated like an in office visit and we will file with pt's insurance, and there may be a patient responsible charge related to this service. Pt's email is [email or web address]. Pt understands that the cisco webex software must be downloaded and operational on the device pt plans to use for the visit. Pt understands that the nurse will be calling to go over pt's chart.

## 2018-08-19 NOTE — Telephone Encounter (Signed)
I called pt. Pt's meds, allergies, and PMH were updated. Pt has a virtual visit with Dr. Anne Hahn this PM.  Pt has never had a sleep study but does endorse snoring.  Pt was instructed on how to measure her neck size prior to her appt tomorrow.  Pt's recent weight is 230lbs and she is 5'4.  Epworth Sleepiness Scale 0= would never doze 1= slight chance of dozing 2= moderate chance of dozing 3= high chance of dozing  Sitting and reading: 3 Watching TV: 3 Sitting inactive in a public place (ex. Theater or meeting): 2 As a passenger in a car for an hour without a break: 3 Lying down to rest in the afternoon: 3 Sitting and talking to someone: 1 Sitting quietly after lunch (no alcohol): 2 In a car, while stopped in traffic: 0 Total: 17  FSS: 63

## 2018-08-19 NOTE — Progress Notes (Signed)
     Virtual Visit via Video Note  I connected with Ashley Franklin on 08/19/18 at  3:00 PM EDT by a video enabled telemedicine application and verified that I am speaking with the correct person using two identifiers.   I discussed the limitations of evaluation and management by telemedicine and the availability of in person appointments. The patient expressed understanding and agreed to proceed.  History of Present Illness: Ashley Franklin is a 48 year old patient with a six-month history of some numbness of the right hand.  The patient indicates that initially the symptoms were intermittent but now they are lasting for 3 to 4 days at a time before the numbness are clear.  The patient states that the numbness affects the thumb, index finger and middle finger of the right hand, she has some achiness to the mid forearm level, but no pain in the neck or shoulder or elbow.  The patient finds that the pain is significant at nighttime, the numbness and discomfort will wake her up at night, she also has problems while driving a car.  She denies any increased symptoms with turning her head or looking up or looking down.  She denies symptoms on the left arm or any numbness in the feet.  She denies any balance problems or difficulty controlling the bowels or the bladder.  She feels slightly weak in the right hand but otherwise she does not have any weakness.  There is no numbness on the face or body.  She is sent to this office for an evaluation.   Observations/Objective: WebEx evaluation reveals that the patient is alert and cooperative.  She is answering questions appropriately.  She has full extraocular movements.  She protrudes the tongue in the midline with good lateral movement of the tongue.  Full range of movement of the neck is noted.  She has good finger-nose-finger and heel-to-shin bilaterally.  Gait is normal, tandem gait normal.  Romberg is negative.  No drift is seen.  The patient was asked to tap  on the wrists, she has a positive Tinel sign on the right, not on the left.  Assessment and Plan: 1.  Right hand numbness, probable carpal tunnel syndrome  The patient will be set up for nerve conduction studies on both arms and EMG on the right arm.  She is to get a wrist splint for the right wrist and wear it for the next 6 to 8 weeks, if no improvement is noted we may refer the patient to a hand surgeon.  Follow Up Instructions: Follow-up for the EMG study.   I discussed the assessment and treatment plan with the patient. The patient was provided an opportunity to ask questions and all were answered. The patient agreed with the plan and demonstrated an understanding of the instructions.   The patient was advised to call back or seek an in-person evaluation if the symptoms worsen or if the condition fails to improve as anticipated.  I provided 25 minutes of non-face-to-face time during this encounter.   York Spaniel, MD

## 2018-08-20 ENCOUNTER — Ambulatory Visit (INDEPENDENT_AMBULATORY_CARE_PROVIDER_SITE_OTHER): Payer: No Typology Code available for payment source | Admitting: Neurology

## 2018-08-20 ENCOUNTER — Encounter: Payer: Self-pay | Admitting: Neurology

## 2018-08-20 ENCOUNTER — Other Ambulatory Visit: Payer: Self-pay

## 2018-08-20 DIAGNOSIS — R0681 Apnea, not elsewhere classified: Secondary | ICD-10-CM

## 2018-08-20 DIAGNOSIS — E669 Obesity, unspecified: Secondary | ICD-10-CM | POA: Diagnosis not present

## 2018-08-20 DIAGNOSIS — R0683 Snoring: Secondary | ICD-10-CM | POA: Diagnosis not present

## 2018-08-20 DIAGNOSIS — G4719 Other hypersomnia: Secondary | ICD-10-CM | POA: Diagnosis not present

## 2018-08-20 NOTE — Patient Instructions (Signed)
Given verbally, during today's virtual video-based encounter, with verbal feedback received.   

## 2018-08-20 NOTE — Progress Notes (Signed)
Huston Foley, MD, PhD [facility] [address] [address] [place], [place] [address]   Virtual Visit via Video Note on 08/20/18:  I connected with Ms. Boman on 08/20/18 at 11:30 AM EDT by a video enabled telemedicine application and verified that I am speaking with the correct person using two identifiers. I called her on her cell phone simultaneously as her audio was not working but video was working for both of [place].    I discussed the limitations of evaluation and management by telemedicine and the availability of in person appointments. The patient expressed understanding and agreed to proceed.  History of Present Illness: Ms. Shaliyah Taite is a 48 year old right-handed woman with an underlying medical history of kidney stone, small bowel obstruction, depression, anxiety, anemia, and obesity, with whom I am conducting a virtual, video based new patient visit via Webex in lieu of a face-to-face visit for evaluation of her sleep disorder, in particular, evaluation for concern for obstructive sleep apnea. The patient is unaccompanied today and joins via phone from home. She is referred by her primary care physician, Dr. Willow Ora, and I reviewed his note from 07/16/2018.  Her Epworth sleepiness score is 17 out of 24, fatigue severity score is 63 out of 63. Of note, she is on several potentially sedating medications including diazepam 15 mg 3 times a day when necessary, Depakote ER 2000 mg at bedtime, gabapentin 300 mg at bedtime, which was just ordered on 08/19/2018 by Dr. Anne Hahn, Lamictal 50 mg daily, let to to 40 mg daily, Ambien 5 mg when necessary at bedtime. She is followed by psychiatry. Mood wise, she is doing well, she is currently a good spot. She's trying to lose weight. She has unable to lose about 18 pounds with the help of Weight Watchers but currently her weight loss endeavors are plateaued. 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 She works  from 5 AM to 5:30 PM on Wednesdays, Thursdays and Fridays. On those nights he goes to bed around 7:30 or 8, on other nights may be as late as 10 or 11. Rise time for work as 4 AM, typically no later than 5 on other mornings even when she can sleep in. She has nocturia about twice per average night, has no recurrent morning headaches and denies telltale restless leg symptoms. Her husband has not only noted snoring but also pauses in her breathing for at least 3 years. She sleeps with the TV on at night, low volume, out of habit since her first husband was out of town a lot and she needed some white noise at night. She lives with her current husband, they have 7 Household, to Typically in Her Bedroom. She is familiar with PAP therapy as her husband has a machine, he had weight loss surgery and has lost a lot of weight. She is in the process of smoking cessation, currently smokes about 1 cigarette per day, drinks alcohol occasionally, maybe once a week, caffeine limitation, 1 cup of coffee in the morning typically.    Her Past Medical History Is Significant For: Past Medical History:  Diagnosis Date   Anemia    Anxiety    Bipolar affective disorder (HCC)    Blood transfusion without reported diagnosis    Bronchospasm with bronchitis, acute    Depression    Kidney stone    SBO (small bowel obstruction) (HCC) 01/2014    Her Past Surgical History Is Significant For: Past Surgical  History:  Procedure Laterality Date   CESAREAN SECTION     ENDOMETRIAL ABLATION     GASTRIC BYPASS  2001   SBO  2015    Her Family History Is Significant For: Family History  Problem Relation Age of Onset   Diabetes Mother    Cancer Mother 60       Throat    Bipolar disorder Mother    Bipolar disorder Sister    Bipolar disorder Brother    Drug abuse Brother    Bipolar disorder Sister    Colon cancer Neg Hx    Cancer - Prostate Neg Hx    Breast cancer Neg Hx     Her Social History Is  Significant For: Social History   Socioeconomic History   Marital status: Married    Spouse name: Not on file   Number of children: 2   Years of education: Not on file   Highest education level: Not on file  Occupational History   Occupation: phelbotomist    Employer: [employer]  Social Network engineer strain: Not on file   Food insecurity:    Worry: Not on file    Inability: Not on file   Transportation needs:    Medical: Not on file    Non-medical: Not on file  Tobacco Use   Smoking status: Current Every Day Smoker    Packs/day: 0.25    Years: 6.00    Pack years: 1.50   Smokeless tobacco: Never Used   Tobacco comment: 1/2 ppd   Substance and Sexual Activity   Alcohol use: Yes    Alcohol/week: 0.0 standard drinks    Comment: Two times a week.    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    1 son born 1988-08-13 daughter born in 53 she is married and lives with her husband   Prior smoker not current 3 caffeinated beverages a day no alcohol tobacco or drug use      2 independent teachers    Pharmacy schools, music teacher   Right handed    Caffeine 2 cups daily     Her Allergies Are:  Allergies  Allergen Reactions   Nsaids Other (See Comments)    Contraindicated with gastric bypass  :   Her Current Medications Are:  Outpatient Encounter Medications as of  08/20/2018  Medication Sig   diazepam (VALIUM) 10 MG tablet Take 1.5 tablets (15 mg total) by mouth 3 (three) times daily as needed for anxiety.   divalproex (DEPAKOTE ER) 500 MG 24 hr tablet Take 4 tablets (2,000 mg total) by mouth at bedtime.   FLUoxetine (PROZAC) 20 MG tablet Take 4 tablets (80 mg total) by mouth daily.   gabapentin (NEURONTIN) 300  MG capsule Take 1 capsule (300 mg total) by mouth at bedtime.   HYDROcodone-acetaminophen (NORCO/VICODIN) 5-325 MG tablet Take 1 tablet by mouth every 6 (six) hours as needed.   lamoTRIgine (LAMICTAL) 25 MG tablet Take 2 tablets (50 mg total) by mouth daily.   linaclotide (LINZESS) 290 MCG CAPS capsule Take 1 capsule (290 mcg total) by mouth daily before breakfast.   lurasidone (LATUDA) 40 MG TABS tablet Take 1 tablet (40 mg total) by mouth daily with breakfast.   varenicline (CHANTIX CONTINUING MONTH PAK) 1 MG tablet Take 1 tablet (1 mg total) by mouth 2 (two) times daily.   zolpidem (AMBIEN) 5 MG tablet Take 1 tablet (5 mg total) by mouth at bedtime.   No facility-administered encounter medications on file as of 08/20/2018.   :   Review of Systems:  Out of a complete 14 point review of systems, all are reviewed and negative with the exception of these symptoms as listed below:  Observations/Objective: Her most recent vital signs available for my review are from 06/19/2018: Blood pressure 109/79, pulse 88, weight 230 pounds for BMI of 39.48. Her weight at home by self report 230 lb, neck circumference by self-report 14.25 in. On examination, she is in no acute distress, pleasant, conversant, speech is clear, not dysarthric, no voice tremor. Face is symmetric, normal facial animation noted. Extraocular movements are preserved. Airway examination reveals a Mallampati class I, tonsils are on the small side, uvula slightly elongated, otherwise normal findings, tongue protrudes centrally and palate elevates symmetrically. She has a minimal  overbite. She is able to walk without problems, upper body and upper extremity coordination is preserved.  Assessment and Plan: Ms. Ethlyn DanielsJennifer Rodd is a 48 year old right-handed woman with an underlying medical history of kidney stone, small bowel obstruction, depression, anxiety, anemia, and obesity, with whom I am conducting a virtual, video based new patient visit via Webex in lieu of a face-to-face visit for evaluation of an underlying organic sleep disorder, in particular, concern for obstructive sleep apnea. The patient's medical history and physical exam (albeit limited with current video-based evaluation) are concerning for a diagnosis of obstructive sleep apnea. I discussed with the patient the diagnosis of OSA, its prognosis and treatment options. I explained in particular the risks and ramifications of untreated moderate to severe OSA, especially with respect to developing cardiovascular disease down the Road, including congestive heart failure, difficult to treat hypertension, cardiac arrhythmias, or stroke. Even type 2 diabetes has, in part, been linked to untreated OSA. Symptoms of untreated OSA may include daytime sleepiness, memory problems, mood irritability and mood disorder such as depression and anxiety, lack of energy, as well as recurrent headaches, especially morning headaches. We talked about smoking cessation and the importance of weight control. We talked about the importance of maintaining good sleep hygiene. I recommended the following at this time: home sleep test.  I explained the sleep test procedure to the patient and also outlined possible treatment options of OSA, including the use of a custom-made dental device (which would require a referral to a specialist dentist), upper airway surgical options, (such as UPPP, which would involve a referral to an ENT). I also explained the CPAP vs. AutoPAP treatment option to the patient, who indicated that she would be willing to try CPAP if  the need arises. I answered all her questions today and the patient was in agreement. I plan to see the patient back after the sleep study is completed and encouraged her to call with  any interim questions, concerns, problems or updates.   Huston Foley, MD, PhD    Follow Up Instructions: 1. HST, single user, disposable unit, cloud-based data collection and retrieval. Sleep lab staff will reach out to patient to arrange for sending the unit to home address and for tutorial, making sure patient is comfortable with the unit and smart phone app. 2. Consider AutoPap therapy if home sleep test positive for obstructive sleep apnea. 3. Follow-up after starting AutoPap therapy, follow-up to be scheduled according to set-up date. 4. Pursue healthy lifestyle, good sleep hygiene, weight loss, smoking cessation. 5. Call for any interim questions or concerns.   I discussed the assessment and treatment plan with the patient. The patient was provided an opportunity to ask questions and all were answered. The patient agreed with the plan and demonstrated an understanding of the instructions.   The patient was advised to call back or seek an in-person evaluation if the symptoms worsen or if the condition fails to improve as anticipated.  I provided 30 minutes of non-face-to-face time during this encounter.   Huston Foley, MD

## 2018-08-27 ENCOUNTER — Ambulatory Visit (INDEPENDENT_AMBULATORY_CARE_PROVIDER_SITE_OTHER): Payer: No Typology Code available for payment source | Admitting: Psychology

## 2018-08-27 DIAGNOSIS — F3132 Bipolar disorder, current episode depressed, moderate: Secondary | ICD-10-CM | POA: Diagnosis not present
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
## 2018-09-02 ENCOUNTER — Other Ambulatory Visit (HOSPITAL_COMMUNITY): Payer: Self-pay | Admitting: Psychiatry

## 2018-09-02 DIAGNOSIS — F3131 Bipolar disorder, current episode depressed, mild: Secondary | ICD-10-CM

## 2018-09-02 MED ORDER — ZOLPIDEM TARTRATE 5 MG PO TABS: 5.0000 mg | ORAL_TABLET | Freq: Every day | ORAL | 0 refills | Status: DC

## 2018-09-02 MED FILL — ZOLPIDEM TARTRATE 5 MG TAB: 5 | 30 days supply | Qty: 30 | Fill #0

## 2018-09-03 ENCOUNTER — Encounter (HOSPITAL_COMMUNITY): Payer: Self-pay | Admitting: Psychiatry

## 2018-09-03 ENCOUNTER — Ambulatory Visit (INDEPENDENT_AMBULATORY_CARE_PROVIDER_SITE_OTHER): Payer: No Typology Code available for payment source | Admitting: Psychiatry

## 2018-09-03 ENCOUNTER — Other Ambulatory Visit: Payer: Self-pay

## 2018-09-03 DIAGNOSIS — F3181 Bipolar II disorder: Secondary | ICD-10-CM | POA: Diagnosis not present

## 2018-09-03 DIAGNOSIS — F3131 Bipolar disorder, current episode depressed, mild: Secondary | ICD-10-CM | POA: Diagnosis not present

## 2018-09-03 MED ORDER — DIAZEPAM 10 MG PO TABS: 15.0000 mg | ORAL_TABLET | Freq: Three times a day (TID) | ORAL | 0 refills | Status: AC | PRN

## 2018-09-03 MED ORDER — LURASIDONE HCL 60 MG PO TABS: 60.0000 mg | ORAL_TABLET | Freq: Every day | ORAL | 0 refills | Status: DC

## 2018-09-03 MED ORDER — FLUOXETINE HCL 20 MG PO TABS: 80.0000 mg | ORAL_TABLET | Freq: Every day | ORAL | 0 refills | Status: DC

## 2018-09-03 MED ORDER — DIVALPROEX SODIUM ER 500 MG PO TB24: 2000.0000 mg | ORAL_TABLET | Freq: Every day | ORAL | 0 refills | Status: DC

## 2018-09-03 MED FILL — LATUDA 60 MG TABLET: 60 | 30 days supply | Qty: 30 | Fill #0

## 2018-09-10 ENCOUNTER — Ambulatory Visit (INDEPENDENT_AMBULATORY_CARE_PROVIDER_SITE_OTHER): Payer: No Typology Code available for payment source | Admitting: Psychology

## 2018-09-10 DIAGNOSIS — F3132 Bipolar disorder, current episode depressed, moderate: Secondary | ICD-10-CM

## 2018-09-16 ENCOUNTER — Other Ambulatory Visit: Payer: Self-pay

## 2018-09-16 ENCOUNTER — Ambulatory Visit (INDEPENDENT_AMBULATORY_CARE_PROVIDER_SITE_OTHER): Payer: No Typology Code available for payment source | Admitting: Neurology

## 2018-09-16 DIAGNOSIS — E669 Obesity, unspecified: Secondary | ICD-10-CM

## 2018-09-16 DIAGNOSIS — G4733 Obstructive sleep apnea (adult) (pediatric): Secondary | ICD-10-CM

## 2018-09-16 DIAGNOSIS — R0681 Apnea, not elsewhere classified: Secondary | ICD-10-CM

## 2018-09-16 DIAGNOSIS — G4719 Other hypersomnia: Secondary | ICD-10-CM

## 2018-09-16 DIAGNOSIS — R0683 Snoring: Secondary | ICD-10-CM

## 2018-09-18 ENCOUNTER — Telehealth: Payer: Self-pay

## 2018-09-18 NOTE — Telephone Encounter (Signed)
I called pt. I advised pt that Dr. Frances Furbish reviewed their sleep study results and found that pt has mild osa. Dr. Frances Furbish recommends that pt start an auto pap at home. I reviewed PAP compliance expectations with the pt. Pt is agreeable to starting an auto-PAP. I advised pt that an order will be sent to a DME, [facility], and [facility] will call the pt within about one week after they file with the pt's insurance. [facility] will show the pt how to use the machine, fit for masks, and troubleshoot the auto-PAP if needed. A follow up appt was made for insurance purposes with Aundra Millet, NP on 12/02/18 at 11:00am. Pt verbalized understanding to arrive 15 minutes early and bring their auto-PAP. A letter with all of this information in it will be mailed to the pt as a reminder. I verified with the pt that the address we have on file is correct. Pt verbalized understanding of results. Pt had no questions at this time but was encouraged to call back if questions arise. I have sent the order to [facility] and have received confirmation that they have received the order.

## 2018-09-18 NOTE — Telephone Encounter (Signed)
I called pt to discuss her sleep study results. No answer, left a message asking her to call me back. 

## 2018-09-18 NOTE — Telephone Encounter (Signed)
Patient returning your call.

## 2018-09-18 NOTE — Addendum Note (Signed)
Addended by: Huston Foley on: 09/18/2018 08:24 AM   Modules accepted: Orders

## 2018-09-18 NOTE — Procedures (Signed)
Patient Information     First Name: Ramyah Last Name: Franklin ID: [id number]  Birth Date: August 06, 1970 Age: 48 Gender:           Female  Referring Provider: Wanda Plump, MD BMI:  39.46   Neck Circ.:  16.5 '' Epworth: 17/24 FSS: 17/24  Sleep Study Information     Study Date: Sep 16, 2018 S/H/A Version: [phone] / 4.1.1528 / 22  History: 48 year old woman with a history of kidney stone, small bowel obstruction, depression, anxiety, anemia, and obesity, who reports snoring, witnessed breathing pauses while asleep and excessive daytime sleepiness. Summary & Diagnosis:     OSA, mild Recommendations:      This home sleep test demonstrates mild obstructive sleep apnea with a total AHI of 7.3/hour and O2 nadir of 91%. Given the patient's medical history and sleep related complaints, treatment with positive airway pressure is worth pursuing and can be achieved in the form of autoPAP titration/trial at home. A proper titration study with CPAP may be helpful or needed down the road, when considered safe. Please note, that untreated obstructive sleep apnea may carry additional perioperative morbidity. Patients with significant obstructive sleep apnea should receive perioperative PAP therapy and the surgeons and particularly the anesthesiologist should be informed of the diagnosis and the severity of the sleep disordered breathing. Patient will be reminded regarding compliance with her PAP machine and to be mindful of cleanliness with the equipment and timely with supply changes (i.e. changing filter, mask, hose, humidifier chamber on an ongoing basis as recommended, and cleaning parts that touch the face and nose daily, etc). The patient should be cautioned not to drive, work at heights, or operate dangerous or heavy equipment when tired or sleepy. Review and reiteration of good sleep hygiene measures should be pursued with any patient. Other causes of the patient's symptoms, including circadian rhythm  disturbances, an underlying mood disorder, medication effect and/or an underlying medical problem cannot be ruled out based on this test. Clinical correlation is recommended. The patient and her referring provider will be notified of the test results. The patient will be seen in follow up in sleep clinic at [facility], either for a face-to-face or virtual visit, whichever feasible and recommended at the time.  I certify that I have reviewed the raw data recording prior to the issuance of this report in accordance with the standards of the American Academy of Sleep Medicine (AASM).  Ashley Foley, MD, PhD [facility]) Diplomat, ABPN (Neurology and Sleep)           Sleep Summary  Oxygen Saturation Statistics   Start Study Time: End Study Time: Total Recording Time:  11:45:46 PM     7:13:02 AM    7 hrs, 27 min  Total Sleep Time % REM of Sleep Time:  6 hrs, 33 min 20.8    Mean: 93 Minimum: 91 Maximum: 98  Mean of Desaturations Nadirs (%):   93  Oxygen Desaturation %: 4-9 10-20 >20 Total  Events Number Total  13 100.0  0 0.0  0 0.0  13 100.0  Oxygen Saturation: <90 <=88 <85 <80 <70  Duration (minutes): Sleep % 0.0 0.0 0.0 0.0 0.0 0.0 0.0 0.0 0.0 0.0     Respiratory Indices      Total Events REM NREM All Night  pRDI:  50  pAHI:  47 ODI:  13  pAHIc:  0  % CSR: 0.0 27.0 24.7 7.0 0.0 2.9 2.9 0.8 0.0 7.7  7.3 2.0 0.0       Pulse Rate Statistics during Sleep (BPM)      Mean: 63 Minimum: N/A Maximum: 101    Indices are calculated using technically valid sleep time of  6 hrs, 28 min. pRDI/pAHI are calculated using oxi desaturations ? 3%  Body Position Statistics  Position Supine Prone Right Left Non-Supine  Sleep (min) 339.8 53.5 0.0 0.0 53.5  Sleep % 86.4 13.6 0.0 0.0 13.6  pRDI 9.0 0.0 N/A N/A 0.0  pAHI 8.4 0.0 N/A N/A 0.0  ODI 2.3 0.0 N/A N/A 0.0     Snoring Statistics Snoring Level (dB) >40 >50 >60 >70 >80 >Threshold (45)  Sleep  (min) 159.2 3.6 0.5 0.0 0.0 9.3  Sleep % 40.5 0.9 0.1 0.0 0.0 2.4    Mean: 41 dB Sleep Stages Chart

## 2018-09-18 NOTE — Telephone Encounter (Signed)
-----   Message from Huston Foley, MD sent at 09/18/2018  8:24 AM EDT ----- Patient referred by Dr. Drue Novel, seen by me on 08/20/18 for VV, HST on 09/16/18.    Please call and notify the patient that the recent home sleep test showed obstructive sleep apnea. OSA is overall mild, but worth treating to see if she feels better after treatment. To that end I recommend treatment for this in the form of autoPAP, which means, that we don't have to bring her in for a sleep study with CPAP, but will let her try an autoPAP machine at home, through a DME company (of her choice, or as per insurance requirement). The DME representative will educate her on how to use the machine, how to put the mask on, etc. I have placed an order in the chart. Please send referral, talk to patient, send report to referring MD. We will need a FU in sleep clinic for 10 weeks post-PAP set up, please arrange that with me or one of our NPs. Thanks,   Huston Foley, MD, PhD [facility])

## 2018-09-18 NOTE — Progress Notes (Signed)
Patient referred by Dr. Drue Novel, seen by me on 08/20/18 for VV, HST on 09/16/18.    Please call and notify the patient that the recent home sleep test showed obstructive sleep apnea. OSA is overall mild, but worth treating to see if she feels better after treatment. To that end I recommend treatment for this in the form of autoPAP, which means, that we don't have to bring her in for a sleep study with CPAP, but will let her try an autoPAP machine at home, through a DME company (of her choice, or as per insurance requirement). The DME representative will educate her on how to use the machine, how to put the mask on, etc. I have placed an order in the chart. Please send referral, talk to patient, send report to referring MD. We will need a FU in sleep clinic for 10 weeks post-PAP set up, please arrange that with me or one of our NPs. Thanks,   Huston Foley, MD, PhD [facility])

## 2018-09-23 MED FILL — GABAPENTIN 300 MG CAPSULE: 300 | 30 days supply | Qty: 30 | Fill #1

## 2018-09-25 NOTE — Progress Notes (Signed)
Virtual Visit via Video Note  I connected with [name] on 09/30/18 at  3:00 PM EDT by a video enabled telemedicine application and verified that I am speaking with the correct person using two identifiers.   I discussed the limitations of evaluation and management by telemedicine and the availability of in person appointments. The patient expressed understanding and agreed to proceed.    I discussed the assessment and treatment plan with the patient. The patient was provided an opportunity to ask questions and all were answered. The patient agreed with the plan and demonstrated an understanding of the instructions.   The patient was advised to call back or seek an in-person evaluation if the symptoms worsen or if the condition fails to improve as anticipated.  I provided 25 minutes of non-face-to-face time during this encounter.   [name], MD    [facility] MD/PA/NP OP Progress Note  09/30/2018 3:43 PM CONITA AMENTA  MRN:  [id number]  Chief Complaint:  Chief Complaint    Follow-up; Other     HPI:  - patient is diagnosed with mild sleep apnea. Will receive machine.  This is a follow up visit for bipolar disorder. She states that she is still stressed at work.  She believes it is toxic environment and it is "emotional roller coaster."  She has applied for another job in [employer].  She notices that she tends to withdraw from things, although she is less irritable. She occasionally avoids being with her husband. However, they have been also working on spending time together more. She had "great" mother's day (and smiles). Her son visited the house and it was great surprise for the patient.  She also reports keeping good connection with her parents, although she tries to limit meeting with them due to concern of infection.  She has hypersomnia.  She feels fatigue.  She has fair concentration.  She denies SI.  She feels anxious and tense at times.  She denies panic attacks.  She  denies decreased need for sleep or euphoria. She has been taking less valium when she is not feeling anxiety. She will receive CPAP machine for sleep apnea.   Visit Diagnosis:    ICD-10-CM   1. Bipolar affective disorder, currently depressed, mild (HCC) F31.31 zolpidem (AMBIEN) 5 MG tablet    Past Psychiatric History: Please see initial evaluation for full details. I have reviewed the history. No updates at this time.     Past Medical History:  Past Medical History:  Diagnosis Date  . Anemia   . Anxiety   . Bipolar affective disorder (HCC)   . Blood transfusion without reported diagnosis   . Bronchospasm with bronchitis, acute   . Depression   . Kidney stone   . SBO (small bowel obstruction) (HCC) 01/2014    Past Surgical History:  Procedure Laterality Date  . CESAREAN SECTION    . ENDOMETRIAL ABLATION    . GASTRIC BYPASS  2001  . SBO  2015    Family Psychiatric History: Please see initial evaluation for full details. I have reviewed the history. No updates at this time.     Family History:  Family History  Problem Relation Age of Onset  . Diabetes Mother   . Cancer Mother 60       Throat   . Bipolar disorder Mother   . Bipolar disorder Sister   . Bipolar disorder Brother   . Drug abuse Brother   . Bipolar disorder Sister   .  Colon cancer Neg Hx   . Cancer - Prostate Neg Hx   . Breast cancer Neg Hx     Social History:  Social History   Socioeconomic History  . Marital status: Married    Spouse name: Not on file  . Number of children: 2  . Years of education: Not on file  . Highest education level: Not on file  Occupational History  . Occupation: phelbotomist    Employer: [employer]  Social Needs  . Financial resource strain: Not on file  . Food insecurity:    Worry: Not on file    Inability: Not on file  . Transportation needs:    Medical: Not on file    Non-medical: Not on file  Tobacco Use  . Smoking status: Current Every Day Smoker     Packs/day: 0.25    Years: 6.00    Pack years: 1.50  . Smokeless tobacco: Never Used  . Tobacco comment: 1/2 ppd   Substance and Sexual Activity  . Alcohol use: Yes    Alcohol/week: 0.0 standard drinks    Comment: Two times a week.   . Drug use: No  . Sexual activity: Yes    Partners: Male    Birth control/protection: Surgical  Lifestyle  . Physical activity:    Days per week: Not on file    Minutes per session: Not on file  . Stress: Not on file  Relationships  . Social connections:    Talks on phone: Not on file    Gets together: Not on file    Attends religious service: Not on file    Active member of club or organization: Not on file    Attends meetings of clubs or organizations: Not on file    Relationship status: Not on file  Other Topics Concern  . Not on file  Social History Narrative   The patient is a Water quality scientistphlebotomist at [employer]    1 son born 1988-08-13 daughter born in [id number] she is married and lives with her husband   Prior smoker not current 3 caffeinated beverages a day no alcohol tobacco or drug use      2 independent teachers    Pharmacy schools, music teacher   Right handed    Caffeine 2 cups daily     Allergies:  Allergies  Allergen Reactions  . Nsaids Other (See Comments)    Contraindicated with gastric bypass    Metabolic Disorder Labs: Lab Results  Component Value Date   HGBA1C 4.9 02/06/2017   MPG 105 02/26/2015   No results found for: PROLACTIN Lab Results  Component Value Date   CHOL 165 02/06/2017   TRIG 87 02/06/2017   HDL 54 02/06/2017   LDLCALC 94 02/06/2017   Lab Results  Component Value Date   TSH 1.12 07/10/2017   TSH 1.22 06/08/2016    Therapeutic Level Labs: No results found for: LITHIUM Lab Results  Component Value Date   VALPROATE 107 (H) 01/11/2018   VALPROATE <12.5 (L) 11/21/2017   No components found for:  CBMZ  Current Medications: Current Outpatient Medications  Medication Sig Dispense Refill  .  diazepam (VALIUM) 10 MG tablet Take 1.5 tablets (15 mg total) by mouth 3 (three) times daily as needed for up to 30 days for anxiety. 135 tablet 0  . divalproex (DEPAKOTE ER) 500 MG 24 hr tablet Take 4 tablets (2,000 mg total) by mouth at bedtime. 360 tablet 0  . FLUoxetine (PROZAC) 20 MG tablet  Take 4 tablets (80 mg total) by mouth daily. 360 tablet 0  . gabapentin (NEURONTIN) 300 MG capsule Take 1 capsule (300 mg total) by mouth at bedtime. 30 capsule 3  . HYDROcodone-acetaminophen (NORCO/VICODIN) 5-325 MG tablet Take 1 tablet by mouth every 6 (six) hours as needed. 20 tablet 0  . lamoTRIgine (LAMICTAL) 25 MG tablet Take 2 tablets (50 mg total) by mouth daily. 180 tablet 0  . linaclotide (LINZESS) 290 MCG CAPS capsule Take 1 capsule (290 mcg total) by mouth daily before breakfast. 30 capsule 3  . lurasidone (LATUDA) 40 MG TABS tablet Take 1 tablet (40 mg total) by mouth daily with breakfast. 30 tablet 1  . Lurasidone HCl 60 MG TABS Take 1 tablet (60 mg total) by mouth daily. 90 tablet 0  . varenicline (CHANTIX CONTINUING MONTH PAK) 1 MG tablet Take 1 tablet (1 mg total) by mouth 2 (two) times daily. 60 tablet 2  . zolpidem (AMBIEN) 5 MG tablet Take 1 tablet (5 mg total) by mouth at bedtime. 30 tablet 1   No current facility-administered medications for this visit.      Musculoskeletal: Strength & Muscle Tone: N/A Gait & Station: N/A Patient leans: N/A  Psychiatric Specialty Exam: Review of Systems  Psychiatric/Behavioral: Positive for depression. Negative for hallucinations, memory loss, substance abuse and suicidal ideas. The patient is nervous/anxious and has insomnia.   All other systems reviewed and are negative.   There were no vitals taken for this visit.There is no height or weight on file to calculate BMI.  General Appearance: Fairly Groomed  Eye Contact:  Good  Speech:  Clear and Coherent  Volume:  Normal  Mood:  Anxious  Affect:  Appropriate, Congruent and less tense, more  reactive  Thought Process:  Coherent  Orientation:  Full (Time, Place, and Person)  Thought Content: Logical   Suicidal Thoughts:  No  Homicidal Thoughts:  No  Memory:  Immediate;   Good  Judgement:  Good  Insight:  Fair  Psychomotor Activity:  Normal  Concentration:  Concentration: Good and Attention Span: Good  Recall:  Good  Fund of Knowledge: Good  Language: Good  Akathisia:  No  Handed:  Right  AIMS (if indicated): not done  Assets:  Communication Skills Desire for Improvement  ADL's:  Intact  Cognition: WNL  Sleep:  hypersomnia   Screenings: PHQ2-9     Nutrition from [facility]/06/2017 [facility] Visit from 06/08/2016 in [facility] Office Visit from 03/15/2015 in [facility] Office Visit from 01/12/2015 in [facility]  PHQ-2 Total Score  0  0  0  0  PHQ-9 Total Score  -  2  -  -       Assessment and Plan:  NIOBE DICK is a 48 y.o. year old female with a history of bipolar disorder, anxiety,anemia, obesity s/p gastric bypass surgery , who presents for follow up appointment for Bipolar affective disorder, currently depressed, mild (HCC) - Plan: zolpidem (AMBIEN) 5 MG tablet  # Bipolar disorder, most recent episode depressed  # unspecified anxiety disorder Exam is notable for improvement in restricted affect, and there has been more improvement in depressive symptoms and irritability after up titration of Latuda.  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  Will continue current medication regimen.  Will continue Depakote to target mood dysregulation.  She will get the lab test; will adjust the dose accordingly.  Will continue lamotrigine to target bipolar disorder.  Discussed risk of Stevens-Johnson syndrome especially with concomitant use of Depakote.  Will continue  fluoxetine for anxiety and depression.  Will continue Latuda for mood dysregulation.  Discussed potential metabolic side effect.  Will continue diazepam as needed for anxiety.  Discussed risk of dependence and oversedation; she agrees to take less if able.  Will continue Ambien as needed for insomnia.  Discussed behavioral activation.   # Fatigue It is multifactorial given her active mood symptoms and sleep apnea. She will receive CPAP machine; will continue to monitor.   Plan: I have reviewed and updated plans as below 1.ContinueDepakote 2000 mg at night  3. Continue fluoxetine 80 mg daily  4.Continuelamotrigine50mg  daily 5. Continue latuda 60 mg daily  6. Continue Valium 10-15 mg TID as needed for anxiety 7. ContinueAmbien5 mg at night as needed for sleep Next appointment:  -obtain labs (LFT, Depakote, CBC) - She is on gabapentin 100 mg TID, prescribed at ED - she sees a therapist  Past trials of medication: Lexapro, Paxil, amitriptyline, Abilify (irritable), oxcarbazepine, perphenazine, Trifluoperazine, Klonopin, Valium. Vistaril,  The patient demonstrates the following risk factors for suicide: Chronic risk factorsfor suicide include psychiatric disorder /bipolar disorder,previous self-harm of scratching herself. Acute risk factorsfor suicide includenone. Protective factorsfor this patient include positive social support, positive therapeutic relationship,hope for the future. Considering these factors, the overall suicide risk at this point appears to be low. Patient does have gun access at home, which she declined to lock. Discussed in detail safety plan that anytime having active suicidal thoughts or homicidal thoughts and she need to call 911 or go to local emergency room.   The duration of this appointment visit was 25 minutes of non face-to-face time with the patient.  Greater than 50% of this time was spent in counseling, explanation of  diagnosis, planning of  further management, and coordination of care.  [name], MD 09/30/2018, 3:43 PM

## 2018-09-30 ENCOUNTER — Encounter (HOSPITAL_COMMUNITY): Payer: Self-pay | Admitting: Psychiatry

## 2018-09-30 ENCOUNTER — Telehealth (HOSPITAL_COMMUNITY): Payer: Self-pay | Admitting: *Deleted

## 2018-09-30 ENCOUNTER — Ambulatory Visit (INDEPENDENT_AMBULATORY_CARE_PROVIDER_SITE_OTHER): Payer: No Typology Code available for payment source | Admitting: Psychiatry

## 2018-09-30 ENCOUNTER — Other Ambulatory Visit: Payer: Self-pay

## 2018-09-30 DIAGNOSIS — F3131 Bipolar disorder, current episode depressed, mild: Secondary | ICD-10-CM | POA: Diagnosis not present

## 2018-09-30 MED ORDER — ZOLPIDEM TARTRATE 5 MG PO TABS: 5.0000 mg | ORAL_TABLET | Freq: Every day | ORAL | 1 refills | Status: DC

## 2018-09-30 MED ORDER — LURASIDONE HCL 60 MG PO TABS: 60.0000 mg | ORAL_TABLET | Freq: Every day | ORAL | 0 refills | Status: DC

## 2018-09-30 MED ORDER — LAMOTRIGINE 25 MG PO TABS: 50.0000 mg | ORAL_TABLET | Freq: Every day | ORAL | 0 refills | Status: DC

## 2018-09-30 MED FILL — ZOLPIDEM TARTRATE 5 MG TAB: 5 | 30 days supply | Qty: 30 | Fill #0

## 2018-09-30 MED FILL — lamoTRIgine 25 MG TABS: 25 | 90 days supply | Qty: 180 | Fill #0

## 2018-09-30 MED FILL — LATUDA 60 MG TABLET: 60 | 90 days supply | Qty: 90 | Fill #0

## 2018-09-30 NOTE — Telephone Encounter (Signed)
Just for verification- is this message from this patient? She is not on quetiapine.

## 2018-09-30 NOTE — Telephone Encounter (Signed)
She just had an appointment with me, and I explained to her that she does not need to take quetiapine.

## 2018-09-30 NOTE — Telephone Encounter (Signed)
Dr Vanetta Shawl Patient called to Clarify is she STOP taking Seroquel  100 mg or the 200 mg? is she STOP one of the mg's or BOTH?

## 2018-09-30 NOTE — Telephone Encounter (Signed)
Attempted Call Back & VM call on LVM(to return call)

## 2018-09-30 NOTE — Telephone Encounter (Signed)
According to # Left on VM [phone] this is the # left  & message left

## 2018-10-01 ENCOUNTER — Ambulatory Visit: Payer: No Typology Code available for payment source | Admitting: Psychology

## 2018-10-03 LAB — COMPREHENSIVE METABOLIC PANEL
ALT: 28 IU/L (ref 0–32)
AST: 37 IU/L (ref 0–40)
Albumin/Globulin Ratio: 1.7 (ref 1.2–2.2)
Albumin: 4.2 g/dL (ref 3.8–4.8)
Alkaline Phosphatase: 72 IU/L (ref 39–117)
BUN/Creatinine Ratio: 35 — ABNORMAL HIGH (ref 9–23)
BUN: 24 mg/dL (ref 6–24)
Bilirubin Total: 0.2 mg/dL (ref 0.0–1.2)
CALCIUM: 9.1 mg/dL (ref 8.7–10.2)
CO2: 25 mmol/L (ref 20–29)
Chloride: 101 mmol/L (ref 96–106)
Creatinine, Ser: 0.69 mg/dL (ref 0.57–1.00)
GFR calc Af Amer: 122 mL/min/{1.73_m2} (ref >59)
GFR calc non Af Amer: 105 mL/min/{1.73_m2} (ref >59)
Globulin, Total: 2.5 g/dL (ref 1.5–4.5)
Glucose: 75 mg/dL (ref 65–99)
Potassium: 4.5 mmol/L (ref 3.5–5.2)
Sodium: 139 mmol/L (ref 134–144)
Total Protein: 6.7 g/dL (ref 6.0–8.5)

## 2018-10-03 LAB — CBC
Hematocrit: 40.4 % (ref 34.0–46.6)
Hemoglobin: 13.6 g/dL (ref 11.1–15.9)
MCH: 30.9 pg (ref 26.6–33.0)
MCHC: 33.7 g/dL (ref 31.5–35.7)
MCV: 92 fL (ref 79–97)
Platelets: 230 10*3/uL (ref 150–450)
RBC: 4.4 x10E6/uL (ref 3.77–5.28)
RDW: 12.8 % (ref 11.7–15.4)
WBC: 7.2 10*3/uL (ref 3.4–10.8)

## 2018-10-03 LAB — VALPROIC ACID LEVEL: Valproic Acid Lvl: 64 ug/mL (ref 50–100)

## 2018-10-03 NOTE — Progress Notes (Signed)
Lab results are within acceptable range. Continue please continue current dose of Depakote.

## 2018-10-15 ENCOUNTER — Ambulatory Visit: Payer: No Typology Code available for payment source | Admitting: Psychology

## 2018-10-15 MED FILL — DIVALPROEX SOD ER 500 MG TA: 500 | 90 days supply | Qty: 360 | Fill #0

## 2018-10-17 ENCOUNTER — Other Ambulatory Visit (HOSPITAL_COMMUNITY): Payer: Self-pay | Admitting: Orthopedic Surgery

## 2018-10-17 DIAGNOSIS — M25522 Pain in left elbow: Secondary | ICD-10-CM

## 2018-10-22 ENCOUNTER — Ambulatory Visit: Payer: No Typology Code available for payment source | Admitting: Psychology

## 2018-10-22 ENCOUNTER — Encounter (HOSPITAL_BASED_OUTPATIENT_CLINIC_OR_DEPARTMENT_OTHER): Payer: Self-pay | Admitting: Emergency Medicine

## 2018-10-22 ENCOUNTER — Emergency Department (HOSPITAL_BASED_OUTPATIENT_CLINIC_OR_DEPARTMENT_OTHER): Payer: No Typology Code available for payment source

## 2018-10-22 ENCOUNTER — Emergency Department (HOSPITAL_BASED_OUTPATIENT_CLINIC_OR_DEPARTMENT_OTHER)
Admission: EM | Admit: 2018-10-22 | Discharge: 2018-10-22 | Disposition: A | Discharge status: Home / Self Care | Payer: No Typology Code available for payment source | Attending: Emergency Medicine | Admitting: Emergency Medicine

## 2018-10-22 ENCOUNTER — Other Ambulatory Visit: Payer: Self-pay

## 2018-10-22 DIAGNOSIS — S1091XA Abrasion of unspecified part of neck, initial encounter: Secondary | ICD-10-CM | POA: Insufficient documentation

## 2018-10-22 DIAGNOSIS — W03XXXA Other fall on same level due to collision with another person, initial encounter: Secondary | ICD-10-CM | POA: Insufficient documentation

## 2018-10-22 DIAGNOSIS — F17210 Nicotine dependence, cigarettes, uncomplicated: Secondary | ICD-10-CM | POA: Insufficient documentation

## 2018-10-22 DIAGNOSIS — Y9389 Activity, other specified: Secondary | ICD-10-CM | POA: Diagnosis not present

## 2018-10-22 DIAGNOSIS — S0990XA Unspecified injury of head, initial encounter: Secondary | ICD-10-CM | POA: Diagnosis present

## 2018-10-22 DIAGNOSIS — Z79899 Other long term (current) drug therapy: Secondary | ICD-10-CM | POA: Diagnosis not present

## 2018-10-22 DIAGNOSIS — S060X0A Concussion without loss of consciousness, initial encounter: Secondary | ICD-10-CM | POA: Diagnosis not present

## 2018-10-22 DIAGNOSIS — B3789 Other sites of candidiasis: Secondary | ICD-10-CM | POA: Diagnosis not present

## 2018-10-22 DIAGNOSIS — Y998 Other external cause status: Secondary | ICD-10-CM | POA: Insufficient documentation

## 2018-10-22 DIAGNOSIS — S300XXA Contusion of lower back and pelvis, initial encounter: Secondary | ICD-10-CM | POA: Insufficient documentation

## 2018-10-22 DIAGNOSIS — Y929 Unspecified place or not applicable: Secondary | ICD-10-CM | POA: Diagnosis not present

## 2018-10-22 MED ORDER — KETOROLAC TROMETHAMINE 60 MG/2ML IM SOLN
60.0000 mg | Freq: Once | INTRAMUSCULAR | Status: AC
  Administered 2018-10-22: 11:00:00 60 mg via INTRAMUSCULAR
  Filled 2018-10-22: qty 2

## 2018-10-22 MED ORDER — DICLOFENAC SODIUM 1 % TD GEL: 4.0000 g | Freq: Four times a day (QID) | TRANSDERMAL | 1 refills | Status: DC

## 2018-10-22 MED ORDER — NYSTATIN 100000 UNIT/GM EX CREA: TOPICAL_CREAM | CUTANEOUS | 1 refills | Status: DC

## 2018-10-22 MED FILL — GABAPENTIN 300 MG CAPSULE: 300 | 30 days supply | Qty: 30 | Fill #2

## 2018-10-22 MED FILL — DICLOFENAC SODIUM 1 % GEL: 1 | 7 days supply | Qty: 100 | Fill #0

## 2018-10-22 MED FILL — NYSTATIN 100,000 UNIT/GM CR: 100000 | 30 days supply | Qty: 30 | Fill #0

## 2018-10-22 NOTE — ED Notes (Signed)
Patient transported to X-ray & CT °
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
## 2018-10-28 ENCOUNTER — Other Ambulatory Visit: Payer: Self-pay

## 2018-10-28 ENCOUNTER — Ambulatory Visit (HOSPITAL_COMMUNITY)
Admission: RE | Admit: 2018-10-28 | Discharge: 2018-10-28 | Disposition: A | Discharge status: Home / Self Care | Payer: PRIVATE HEALTH INSURANCE | Source: Ambulatory Visit | Attending: Orthopedic Surgery | Admitting: Orthopedic Surgery

## 2018-10-28 DIAGNOSIS — M25522 Pain in left elbow: Secondary | ICD-10-CM | POA: Diagnosis not present

## 2018-10-31 MED FILL — DIAZEPAM 10 MG TABS: 10 | 30 days supply | Qty: 135 | Fill #0

## 2018-10-31 MED FILL — ZOLPIDEM TARTRATE 5 MG TAB: 5 | 30 days supply | Qty: 30 | Fill #1

## 2018-11-05 ENCOUNTER — Ambulatory Visit: Payer: No Typology Code available for payment source | Admitting: Psychology

## 2018-11-11 ENCOUNTER — Other Ambulatory Visit: Payer: Self-pay

## 2018-11-11 ENCOUNTER — Ambulatory Visit (INDEPENDENT_AMBULATORY_CARE_PROVIDER_SITE_OTHER): Payer: No Typology Code available for payment source | Admitting: Internal Medicine

## 2018-11-11 ENCOUNTER — Encounter: Payer: Self-pay | Admitting: Internal Medicine

## 2018-11-11 DIAGNOSIS — S060X0D Concussion without loss of consciousness, subsequent encounter: Secondary | ICD-10-CM | POA: Diagnosis not present

## 2018-11-11 DIAGNOSIS — S3992XD Unspecified injury of lower back, subsequent encounter: Secondary | ICD-10-CM

## 2018-11-11 NOTE — Progress Notes (Signed)
Subjective:    Patient ID: Ashley Franklin, female    DOB: 1971/03/26, 48 y.o.   MRN: [id number]  DOS:  11/11/2018 Type of visit - description: Virtual Visit via Video Note  I connected with@ on 11/11/18 at  1:00 PM EDT by a video enabled telemedicine application and verified that I am speaking with the correct person using two identifiers.   THIS ENCOUNTER IS A VIRTUAL VISIT DUE TO COVID-19 - PATIENT WAS NOT SEEN IN THE OFFICE. PATIENT HAS CONSENTED TO VIRTUAL VISIT / TELEMEDICINE VISIT   Location of patient: home  Location of provider: office  I discussed the limitations of evaluation and management by telemedicine and the availability of in person appointments. The patient expressed understanding and agreed to proceed.  History of Present Illness: Acute Went to the ER approximately 3 weeks ago, she had a tail bone and head injury. X-ray of the lumbosacral spine and CT of the head were negative. Please see the ER note for details.  The patient is calling because she continue with some of her symptoms, the "tailbone" still hurts when she sits in hard surfaces.  On a padded chairs she does better Still has a headache, not worse than before, it is still located mostly at the site of the impact (right side).    Review of Systems Denies nausea or vomiting No neck pain or stiffness No dizziness or diplopia She described herself as clumsy and she probably is a little more clumsy now than before but no true neurological symptoms  Past Medical History:  Diagnosis Date  . Anemia   . Anxiety   . Bipolar affective disorder ([place])   . Blood transfusion without reported diagnosis   . Bronchospasm with bronchitis, acute   . Depression   . Kidney stone   . SBO (small bowel obstruction) ([place]) 01/2014    Past Surgical History:  Procedure Laterality Date  . CESAREAN SECTION    . ENDOMETRIAL ABLATION    . GASTRIC BYPASS  2001  . SBO  2015    Social History   Socioeconomic History   . Marital status: Married    Spouse name: Not on file  . Number of children: 2  . Years of education: Not on file  . Highest education level: Not on file  Occupational History  . Occupation: phelbotomist    Employer: [employer]  Social Needs  . Financial resource strain: Not on file  . Food insecurity    Worry: Not on file    Inability: Not on file  . Transportation needs    Medical: Not on file    Non-medical: Not on file  Tobacco Use  . Smoking status: Current Every Day Smoker    Packs/day: 0.25    Years: 6.00    Pack years: 1.50  . Smokeless tobacco: Never Used  . Tobacco comment: 1/2 ppd   Substance and Sexual Activity  . Alcohol use: Yes    Alcohol/week: 0.0 standard drinks    Comment: Two times a week.   . Drug use: No  . Sexual activity: Yes    Partners: Male    Birth control/protection: Surgical  Lifestyle  . Physical activity    Days per week: Not on file    Minutes per session: Not on file  . Stress: Not on file  Relationships  . Social Herbalist on phone: Not on file    Gets together: Not on file    Attends  religious service: Not on file    Active member of club or organization: Not on file    Attends meetings of clubs or organizations: Not on file    Relationship status: Not on file  . Intimate partner violence    Fear of current or ex partner: Not on file    Emotionally abused: Not on file    Physically abused: Not on file    Forced sexual activity: Not on file  Other Topics Concern  . Not on file  Social History Narrative   The patient is a phlebotomist at [employer]    1 son born 1988-08-13 daughter born in [id number] she is married and lives with her husband   Prior smoker not current 3 caffeinated beverages a day no alcohol tobacco or drug use      2 independent teachers    Pharmacy schools, music teacher   Right handed    Caffeine 2 cups daily       Allergies as of 11/11/2018      Reactions   Nsaids Other (See Comments)    Contraindicated with gastric bypass      Medication List       Accurate as of November 11, 2018  1:04 PM. If you have any questions, ask your nurse or doctor.        diclofenac sodium 1 % Gel Commonly known as: VOLTAREN Apply 4 g topically 4 (four) times daily.   divalproex 500 MG 24 hr tablet Commonly known as: DEPAKOTE ER Take 4 tablets (2,000 mg total) by mouth at bedtime.   FLUoxetine 20 MG tablet Commonly known as: PROZAC Take 4 tablets (80 mg total) by mouth daily.   gabapentin 300 MG capsule Commonly known as: NEURONTIN Take 1 capsule (300 mg total) by mouth at bedtime.   HYDROcodone-acetaminophen 5-325 MG tablet Commonly known as: NORCO/VICODIN Take 1 tablet by mouth every 6 (six) hours as needed.   lamoTRIgine 25 MG tablet Commonly known as: LAMICTAL Take 2 tablets (50 mg total) by mouth daily.   linaclotide 290 MCG Caps capsule Commonly known as: Linzess Take 1 capsule (290 mcg total) by mouth daily before breakfast.   lurasidone 40 MG Tabs tablet Commonly known as: LATUDA Take 1 tablet (40 mg total) by mouth daily with breakfast.   Lurasidone HCl 60 MG Tabs Take 1 tablet (60 mg total) by mouth daily.   nystatin cream Commonly known as: MYCOSTATIN Apply to affected area 2 times daily   varenicline 1 MG tablet Commonly known as: Chantix Continuing Month Pak Take 1 tablet (1 mg total) by mouth 2 (two) times daily.   zolpidem 5 MG tablet Commonly known as: AMBIEN Take 1 tablet (5 mg total) by mouth at bedtime.           Objective:   Physical Exam There were no vitals taken for this visit. This is a virtual video visit She is alert oriented x3, no apparent distress, moves her head without any apparent problems    Assessment      Assesment Bipolar, anxiety: per psych Dr Vanetta ShawlHisada H/o kidney stones H/o SBO ~ 2015 Birth control: s/p ablation  Plan: Lumbosacral spine contusion: Advised patient that this type of pain may last for several weeks,  she is rotating ibuprofen/Tylenol as needed, I think that is a good strategy, recommend to use consistently a doughnut pillow and call if not gradually better in the next few weeks Head concussion: With no red flag symptoms, I also  think she needs to wait a few more weeks for the complete resolution of symptoms.  She will contact me if that is not the case She verbalized understanding of my advice.   I discussed the assessment and treatment plan with the patient. The patient was provided an opportunity to ask questions and all were answered. The patient agreed with the plan and demonstrated an understanding of the instructions.   The patient was advised to call back or seek an in-person evaluation if the symptoms worsen or if the condition fails to improve as anticipated.

## 2018-11-12 NOTE — Assessment & Plan Note (Signed)
Lumbosacral spine contusion: Advised patient that this type of pain may last for several weeks, she is rotating ibuprofen/Tylenol as needed, I think that is a good strategy, recommend to use consistently a doughnut pillow and call if not gradually better in the next few weeks Head concussion: With no red flag symptoms, I also think she needs to wait a few more weeks for the complete resolution of symptoms.  She will contact me if that is not the case She verbalized understanding of my advice.

## 2018-11-19 ENCOUNTER — Ambulatory Visit (INDEPENDENT_AMBULATORY_CARE_PROVIDER_SITE_OTHER): Payer: No Typology Code available for payment source | Admitting: Psychology

## 2018-11-19 DIAGNOSIS — F3132 Bipolar disorder, current episode depressed, moderate: Secondary | ICD-10-CM

## 2018-11-19 DIAGNOSIS — F422 Mixed obsessional thoughts and acts: Secondary | ICD-10-CM | POA: Diagnosis not present

## 2018-11-26 ENCOUNTER — Ambulatory Visit (INDEPENDENT_AMBULATORY_CARE_PROVIDER_SITE_OTHER): Payer: No Typology Code available for payment source | Admitting: Psychology

## 2018-11-26 DIAGNOSIS — F3132 Bipolar disorder, current episode depressed, moderate: Secondary | ICD-10-CM | POA: Diagnosis not present

## 2018-11-26 DIAGNOSIS — F422 Mixed obsessional thoughts and acts: Secondary | ICD-10-CM | POA: Diagnosis not present

## 2018-11-27 ENCOUNTER — Encounter: Payer: Self-pay | Admitting: Neurology

## 2018-11-27 NOTE — Progress Notes (Signed)
Virtual Visit via Video Note  I connected with Ashley Franklin on 12/02/18 at  3:00 PM EDT by a video enabled telemedicine application and verified that I am speaking with the correct person using two identifiers.   I discussed the limitations of evaluation and management by telemedicine and the availability of in person appointments. The patient expressed understanding and agreed to proceed.    I discussed the assessment and treatment plan with the patient. The patient was provided an opportunity to ask questions and all were answered. The patient agreed with the plan and demonstrated an understanding of the instructions.   The patient was advised to call back or seek an in-person evaluation if the symptoms worsen or if the condition fails to improve as anticipated.  I provided 25 minutes of non-face-to-face time during this encounter.   Neysa Hottereina Talis Iwan, MD    [facility] MD/PA/NP OP Progress Note  12/02/2018 3:50 PM Ashley Franklin  MRN:  [id number]  Chief Complaint:  Chief Complaint    Follow-up; Other     HPI:  This is a follow-up appointment for bipolar disorder.  She states that she has been feeling good since her last appointment.  She feels less depressed and has more motivation.  She tends to feel anxious at work.  Her husband is searching for another career, and he hopes the patient to be at home as the stress at work is causing her mood issues. She feels comfortable with this plan, and is waiting to see what happens. She has two jobs. She is hoping to apply another position in [facility] while she is out of work due to her recent surgery in arm.  She sleeps better after she started CPAP machine.  She feels less fatigue.  She denies SI.  She feels anxious and tense at times.  She denies panic attacks.  She complains of forgetfulness. She tends to forget appointment, or things to buy at grocery store. 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 She is willing to reduce valium so that it does not affect her cognition.  She denies decreased need for sleep or euphoria. She has less hand tremors.    Visit Diagnosis:    ICD-10-CM   1. Bipolar affective disorder, currently depressed, mild (HCC)  F31.31 FLUoxetine (PROZAC) 20 MG tablet    zolpidem (AMBIEN) 5 MG tablet    Past Psychiatric History: Please see initial evaluation for full details. I have reviewed the history. No updates at this time.     Past Medical History:  Past Medical History:  Diagnosis Date  . Anemia   . Anxiety   . Bipolar affective disorder (HCC)   . Blood transfusion without reported diagnosis   . Bronchospasm with bronchitis, acute   . Depression   . Kidney stone   . SBO (small bowel obstruction) (HCC) 01/2014    Past Surgical History:  Procedure Laterality Date  . CESAREAN SECTION    . ENDOMETRIAL ABLATION    . GASTRIC BYPASS  2001  . SBO  2015    Family Psychiatric History: Please see initial evaluation for full details. I have reviewed the history. No updates at this time.     Family History:  Family History  Problem Relation Age of Onset  . Diabetes Mother   . Cancer Mother 60       Throat   .  Bipolar disorder Mother   . Bipolar disorder Sister   . Bipolar disorder Brother   . Drug abuse Brother   . Bipolar disorder Sister   . Colon cancer Neg Hx   . Cancer - Prostate Neg Hx   . Breast cancer Neg Hx     Social History:  Social History   Socioeconomic History  . Marital status: Married    Spouse name: Not on file  . Number of children: 2  . Years of education: Not on file  . Highest education level: Not on file  Occupational History  . Occupation: phelbotomist    Employer: [employer]  Social Needs  . Financial resource strain: Not on file  . Food insecurity    Worry: Not on file    Inability: Not on file  . Transportation needs    Medical: Not on file     Non-medical: Not on file  Tobacco Use  . Smoking status: Current Every Day Smoker    Packs/day: 0.25    Years: 6.00    Pack years: 1.50  . Smokeless tobacco: Never Used  . Tobacco comment: 1/2 ppd   Substance and Sexual Activity  . Alcohol use: Yes    Alcohol/week: 0.0 standard drinks    Comment: Two times a week.   . Drug use: No  . Sexual activity: Yes    Partners: Male    Birth control/protection: Surgical  Lifestyle  . Physical activity    Days per week: Not on file    Minutes per session: Not on file  . Stress: Not on file  Relationships  . Social Musicianconnections    Talks on phone: Not on file    Gets together: Not on file    Attends religious service: Not on file    Active member of club or organization: Not on file    Attends meetings of clubs or organizations: Not on file    Relationship status: Not on file  Other Topics Concern  . Not on file  Social History Narrative   The patient is a Water quality scientistphlebotomist at [employer]    1 son born 1988-08-13 daughter born in [id number] she is married and lives with her husband   Prior smoker not current 3 caffeinated beverages a day no alcohol tobacco or drug use      2 independent teachers    Pharmacy schools, music teacher   Right handed    Caffeine 2 cups daily     Allergies:  Allergies  Allergen Reactions  . Nsaids Other (See Comments)    Contraindicated with gastric bypass    Metabolic Disorder Labs: Lab Results  Component Value Date   HGBA1C 4.9 02/06/2017   MPG 105 02/26/2015   No results found for: PROLACTIN Lab Results  Component Value Date   CHOL 165 02/06/2017   TRIG 87 02/06/2017   HDL 54 02/06/2017   LDLCALC 94 02/06/2017   Lab Results  Component Value Date   TSH 1.12 07/10/2017   TSH 1.22 06/08/2016    Therapeutic Level Labs: No results found for: LITHIUM Lab Results  Component Value Date   VALPROATE 64 10/02/2018   VALPROATE 107 (H) 01/11/2018   No components found for:  CBMZ  Current  Medications: Current Outpatient Medications  Medication Sig Dispense Refill  . [START ON 12/30/2018] diazepam (VALIUM) 10 MG tablet 10 mg once a day, and 15 mg twice a day as needed for anxiety 120 tablet 0  . diclofenac sodium (  VOLTAREN) 1 % GEL Apply 4 g topically 4 (four) times daily. 100 g 1  . [START ON 12/29/2018] divalproex (DEPAKOTE ER) 500 MG 24 hr tablet Take 4 tablets (2,000 mg total) by mouth at bedtime. 360 tablet 0  . [START ON 12/31/2018] FLUoxetine (PROZAC) 20 MG tablet Take 4 tablets (80 mg total) by mouth daily. 360 tablet 0  . gabapentin (NEURONTIN) 300 MG capsule Take 1 capsule (300 mg total) by mouth at bedtime. 30 capsule 3  . [START ON 12/31/2018] lamoTRIgine (LAMICTAL) 25 MG tablet Take 2 tablets (50 mg total) by mouth daily. 180 tablet 0  . [START ON 12/31/2018] Lurasidone HCl 60 MG TABS Take 1 tablet (60 mg total) by mouth daily. 90 tablet 0  . nystatin cream (MYCOSTATIN) Apply to affected area 2 times daily 30 g 1  . varenicline (CHANTIX CONTINUING MONTH PAK) 1 MG tablet Take 1 tablet (1 mg total) by mouth 2 (two) times daily. 60 tablet 2  . [START ON 12/30/2018] zolpidem (AMBIEN) 5 MG tablet Take 1 tablet (5 mg total) by mouth at bedtime. 30 tablet 0   No current facility-administered medications for this visit.      Musculoskeletal: Strength & Muscle Tone: N/A Gait & Station: N/A Patient leans: N/A  Psychiatric Specialty Exam: Review of Systems  Psychiatric/Behavioral: Positive for memory loss. Negative for depression, hallucinations, substance abuse and suicidal ideas. The patient is nervous/anxious. The patient does not have insomnia.   All other systems reviewed and are negative.   There were no vitals taken for this visit.There is no height or weight on file to calculate BMI.  General Appearance: Fairly Groomed  Eye Contact:  Good  Speech:  Clear and Coherent  Volume:  Normal  Mood:  "good"  Affect:  Appropriate, Congruent and calmer, less tense  Thought  Process:  Coherent  Orientation:  Full (Time, Place, and Person)  Thought Content: Logical   Suicidal Thoughts:  No  Homicidal Thoughts:  No  Memory:  Immediate;   Good  Judgement:  Good  Insight:  Fair  Psychomotor Activity:  Normal  Concentration:  Concentration: Good and Attention Span: Good  Recall:  Good  Fund of Knowledge: Good  Language: Good  Akathisia:  No  Handed:  Right  AIMS (if indicated): not done  Assets:  Communication Skills Desire for Improvement  ADL's:  Intact  Cognition: WNL  Sleep:  Good   Screenings: PHQ2-9     N[facility] Visit from 06/08/2016 in [place] Office Visit from 03/15/2015 in [facility] [facility] Visit from 01/12/2015 in [place]  PHQ-2 Total Score  0  0  0  0  PHQ-9 Total Score  -  2  -  -       Assessment and Plan:  Ashley Franklin is a 48 y.o. year old female with a history of bipolar disorder, anxiety, anemia, obesity s/p gastric bypass surgery  , who presents for follow up appointment for bipolar disorder.   # Bipolar disorder, most recent episode depressed # Unspecified anxiety disorder Exam is notable for reactive, calmer affect, and there has been improvement in depressive symptoms and irritability since the last visit.  Psychosocial stressors includes work and pandemic.  Will continue current medication regimen.  Will continue Depakote to target mood dysregulation.  Will continue lamotrigine to target bipolar disorder.  Discussed risk of Stevens-Johnson  syndrome especially with concomitant use of Depakote.  Will continue fluoxetine for depression and anxiety.  Will continue Latuda for mood dysregulation.  Discussed potential metabolic side effect.  Will continue diazepam as needed for anxiety; she is amenable to reduce the dose to avoid potential side effect, which  includes drowsiness and dependence.  Continue Ambien as needed for insomnia.  Discussed behavioral activation.   # Fatigue  There has been significant improvement after she started to use CPAP machine.  Will continue to monitor.   # Memory loss She reports memory loss.  This is likely multifactorial given her mood symptoms and medication use.  Will gradually decrease the Valium to avoid any potential side effect on cognition. She is also advised to discuss with her neurologist about her memory loss (she is on gabapentin, which can also affect her cognition).    Plan: I have reviewed and updated plans as below 1.ContinueDepakote 2000 mg at night  3. Continue fluoxetine 80 mg daily  4.Continuelamotrigine50mg  daily 5. Continue latuda 60 mg daily 6. ReduceValium 10 mg daily, 15 mg twice a day as needed for anxiety  7. ContinueAmbien5 mg at night as needed for sleep Next appointment: 9/8 at 3:30 for 30 mins, video -reviewed labs (LFT, Depakote, CBC) - She is on gabapentin 300 mg at night - she sees a therapistweekly  Past trials of medication: Lexapro, Paxil, amitriptyline, Abilify (irritable), oxcarbazepine, perphenazine, Trifluoperazine, Klonopin, Valium. Vistaril,  The patient demonstrates the following risk factors for suicide: Chronic risk factorsfor suicide include psychiatric disorder /bipolar disorder,previous self-harm of scratching herself. Acute risk factorsfor suicide includenone. Protective factorsfor this patient include positive social support, positive therapeutic relationship,hope for the future. Considering these factors, the overall suicide risk at this point appears to be low. Patient does have gun access at home, which she declined to lock. Discussed in detail safety plan that anytime having active suicidal thoughts or homicidal thoughts and she need to call 911 or go to local emergency room.   The duration of this appointment visit was 25 minutes  of non face-to-face time with the patient.  Greater than 50% of this time was spent in counseling, explanation of  diagnosis, planning of further management, and coordination of care.   Neysa Hottereina Doranne Schmutz, MD 12/02/2018, 3:50 PM

## 2018-11-29 ENCOUNTER — Other Ambulatory Visit (HOSPITAL_COMMUNITY): Payer: Self-pay | Admitting: Psychiatry

## 2018-11-29 DIAGNOSIS — F3131 Bipolar disorder, current episode depressed, mild: Secondary | ICD-10-CM

## 2018-11-29 MED ORDER — DIAZEPAM 10 MG PO TABS: ORAL_TABLET | ORAL | 0 refills | Status: DC

## 2018-11-29 MED ORDER — ZOLPIDEM TARTRATE 5 MG PO TABS: 5.0000 mg | ORAL_TABLET | Freq: Every day | ORAL | 0 refills | Status: DC

## 2018-11-29 MED FILL — DIAZEPAM 10 MG TABS: 10 | 30 days supply | Qty: 135 | Fill #0

## 2018-11-29 MED FILL — oxyCODONE HCL 5 MG TABS: 5 | 5 days supply | Qty: 20 | Fill #0

## 2018-11-29 MED FILL — GABAPENTIN 300 MG CAPSULE: 300 | 30 days supply | Qty: 30 | Fill #3

## 2018-11-29 MED FILL — ZOLPIDEM TARTRATE 5 MG TAB: 5 | 30 days supply | Qty: 30 | Fill #0

## 2018-12-02 ENCOUNTER — Encounter (HOSPITAL_COMMUNITY): Payer: Self-pay | Admitting: Psychiatry

## 2018-12-02 ENCOUNTER — Ambulatory Visit (INDEPENDENT_AMBULATORY_CARE_PROVIDER_SITE_OTHER): Payer: No Typology Code available for payment source | Admitting: Adult Health

## 2018-12-02 ENCOUNTER — Ambulatory Visit (INDEPENDENT_AMBULATORY_CARE_PROVIDER_SITE_OTHER): Payer: No Typology Code available for payment source | Admitting: Psychiatry

## 2018-12-02 ENCOUNTER — Other Ambulatory Visit: Payer: Self-pay

## 2018-12-02 ENCOUNTER — Encounter: Payer: Self-pay | Admitting: Adult Health

## 2018-12-02 VITALS — BP 118/82 | HR 51 | Temp 97.9°F | Ht 64.0 in | Wt 260.8 lb

## 2018-12-02 DIAGNOSIS — F3131 Bipolar disorder, current episode depressed, mild: Secondary | ICD-10-CM | POA: Diagnosis not present

## 2018-12-02 DIAGNOSIS — G4733 Obstructive sleep apnea (adult) (pediatric): Secondary | ICD-10-CM | POA: Diagnosis not present

## 2018-12-02 DIAGNOSIS — Z9989 Dependence on other enabling machines and devices: Secondary | ICD-10-CM

## 2018-12-02 MED ORDER — LURASIDONE HCL 60 MG PO TABS: 60.0000 mg | ORAL_TABLET | Freq: Every day | ORAL | 0 refills | Status: DC

## 2018-12-02 MED ORDER — FLUOXETINE HCL 20 MG PO TABS: 80.0000 mg | ORAL_TABLET | Freq: Every day | ORAL | 0 refills | Status: DC

## 2018-12-02 MED ORDER — DIVALPROEX SODIUM ER 500 MG PO TB24: 2000.0000 mg | ORAL_TABLET | Freq: Every day | ORAL | 0 refills | Status: DC

## 2018-12-02 MED ORDER — DIAZEPAM 10 MG PO TABS: ORAL_TABLET | ORAL | 0 refills | Status: DC

## 2018-12-02 MED ORDER — ZOLPIDEM TARTRATE 5 MG PO TABS: 5.0000 mg | ORAL_TABLET | Freq: Every day | ORAL | 0 refills | Status: DC

## 2018-12-02 MED ORDER — LAMOTRIGINE 25 MG PO TABS: 50.0000 mg | ORAL_TABLET | Freq: Every day | ORAL | 0 refills | Status: DC

## 2018-12-02 MED FILL — FLUoxetine HCL 20 MG CAPS: 20 | 90 days supply | Qty: 360 | Fill #0

## 2018-12-02 NOTE — Patient Instructions (Signed)
1.ContinueDepakote 2000 mg at night  3. Continue fluoxetine 80 mg daily  4.Continuelamotrigine50mg  daily 5. Continue latuda 60 mg daily 6. ReduceValium 10 mg daily, 15 mg twice a day as needed for anxiety  7. ContinueAmbien5 mg at night as needed for sleep Next appointment: 9/8 at 3:30

## 2018-12-02 NOTE — Patient Instructions (Signed)
Continue using CPAP nightly and greater than 4 hours each night °If your symptoms worsen or you develop new symptoms please let us know.  ° °

## 2018-12-02 NOTE — Progress Notes (Addendum)
PATIENT: Ashley OatsJennifer S Franklin DOB: 01-Sep-1970  REASON FOR VISIT: follow up HISTORY FROM: patient  HISTORY OF PRESENT ILLNESS: Today 12/02/18:  Ashley Franklin is a 48 year old female with a history of obstructive sleep apnea on CPAP.  Her download indicates that she used her machine 27 out of 30 days for compliance of 90%.  She use her machine greater than 4 hours 23 days for compliance of 77%.  On average she uses her machine 5 hours and 19 minutes.  Her residual AHI is 1.4 on 5 to 10 cm of water with EPR of 1.  Her leak in the 95th percentile is 16.4 L/min.  Reports that she can see some benefit with using the machine.  HISTORY Ashley Franklin is a 48 year old right-handed woman with an underlying medical history of kidney stone, small bowel obstruction, depression, anxiety, anemia, and obesity, with whom I am conducting a virtual, video based new patient visit via Webex in lieu of a face-to-face visit for evaluation of her sleep disorder, in particular, evaluation for concern for obstructive sleep apnea. The patient is unaccompanied today and joins via phone from home. She is referred by her primary care physician, Dr. Willow OraJose Paz, and I reviewed his note from 07/16/2018.  Her Epworth sleepiness score is 17 out of 24, fatigue severity score is 63 out of 63. Of note, she is on several potentially sedating medications including diazepam 15 mg 3 times a day when necessary, Depakote ER 2000 mg at bedtime, gabapentin 300 mg at bedtime, which was just ordered on 08/19/2018 by Dr. Anne HahnWillis, Lamictal 50 mg daily, let to to 40 mg daily, Ambien 5 mg when necessary at bedtime. She is followed by psychiatry. Mood wise, she is doing well, she is currently a good spot. She's trying to lose weight. She has unable to lose about 18 pounds with the help of Weight Watchers but currently her weight loss endeavors are plateaued. 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 She works from 5 AM to 5:30 PM on Wednesdays,  Thursdays and Fridays. On those nights he goes to bed around 7:30 or 8, on other nights may be as late as 10 or 11. Rise time for work as 4 AM, typically no later than 5 on other mornings even when she can sleep in. She has nocturia about twice per average night, has no recurrent morning headaches and denies telltale restless leg symptoms. Her husband has not only noted snoring but also pauses in her breathing for at least 3 years. She sleeps with the TV on at night, low volume, out of habit since her first husband was out of town a lot and she needed some white noise at night. She lives with her current husband, they have 7 Household, to Typically in Her Bedroom. She is familiar with PAP therapy as her husband has a machine, he had weight loss surgery and has lost a lot of weight. She is in the process of smoking cessation, currently smokes about 1 cigarette per day, drinks alcohol occasionally, maybe once a week, caffeine limitation, 1 cup of coffee in the morning typically.  REVIEW OF SYSTEMS: Out of a complete 14 system review of symptoms, the patient complains only of the following symptoms, and all other reviewed systems are negative.  Epworth sleepiness score 10 fatigue severity score 40  ALLERGIES: Allergies  Allergen Reactions  . Nsaids Other (See Comments)    Contraindicated with gastric bypass    HOME MEDICATIONS: Outpatient Medications  Prior to Visit  Medication Sig Dispense Refill  . diazepam (VALIUM) 10 MG tablet 10-15 mg three times a day as needed for anxiety 135 tablet 0  . diclofenac sodium (VOLTAREN) 1 % GEL Apply 4 g topically 4 (four) times daily. 100 g 1  . divalproex (DEPAKOTE ER) 500 MG 24 hr tablet Take 4 tablets (2,000 mg total) by mouth at bedtime. 360 tablet 0  . FLUoxetine (PROZAC) 20 MG tablet Take 4 tablets (80 mg total) by mouth daily. 360 tablet 0  . gabapentin (NEURONTIN) 300 MG capsule Take 1 capsule (300 mg total) by mouth at bedtime. 30 capsule 3  .  lamoTRIgine (LAMICTAL) 25 MG tablet Take 2 tablets (50 mg total) by mouth daily. 180 tablet 0  . Lurasidone HCl 60 MG TABS Take 1 tablet (60 mg total) by mouth daily. 90 tablet 0  . nystatin cream (MYCOSTATIN) Apply to affected area 2 times daily 30 g 1  . varenicline (CHANTIX CONTINUING MONTH PAK) 1 MG tablet Take 1 tablet (1 mg total) by mouth 2 (two) times daily. 60 tablet 2  . zolpidem (AMBIEN) 5 MG tablet Take 1 tablet (5 mg total) by mouth at bedtime. 30 tablet 0   No facility-administered medications prior to visit.     PAST MEDICAL HISTORY: Past Medical History:  Diagnosis Date  . Anemia   . Anxiety   . Bipolar affective disorder ([place])   . Blood transfusion without reported diagnosis   . Bronchospasm with bronchitis, acute   . Depression   . Kidney stone   . SBO (small bowel obstruction) ([facility]) 01/2014    PAST SURGICAL HISTORY: Past Surgical History:  Procedure Laterality Date  . CESAREAN SECTION    . ENDOMETRIAL ABLATION    . GASTRIC BYPASS  2001  . SBO  2015    FAMILY HISTORY: Family History  Problem Relation Age of Onset  . Diabetes Mother   . Cancer Mother 60       Throat   . Bipolar disorder Mother   . Bipolar disorder Sister   . Bipolar disorder Brother   . Drug abuse Brother   . Bipolar disorder Sister   . Colon cancer Neg Hx   . Cancer - Prostate Neg Hx   . Breast cancer Neg Hx     SOCIAL HISTORY: Social History   Socioeconomic History  . Marital status: Married    Spouse name: Not on file  . Number of children: 2  . Years of education: Not on file  . Highest education level: Not on file  Occupational History  . Occupation: phelbotomist    Employer: [employer]  Social Needs  . Financial resource strain: Not on file  . Food insecurity    Worry: Not on file    Inability: Not on file  . Transportation needs    Medical: Not on file    Non-medical: Not on file  Tobacco Use  . Smoking status: Current Every Day Smoker    Packs/day: 0.25     Years: 6.00    Pack years: 1.50  . Smokeless tobacco: Never Used  . Tobacco comment: 1/2 ppd   Substance and Sexual Activity  . Alcohol use: Yes    Alcohol/week: 0.0 standard drinks    Comment: Two times a week.   . Drug use: No  . Sexual activity: Yes    Partners: Male    Birth control/protection: Surgical  Lifestyle  . Physical activity    Days per  week: Not on file    Minutes per session: Not on file  . Stress: Not on file  Relationships  . Social Musicianconnections    Talks on phone: Not on file    Gets together: Not on file    Attends religious service: Not on file    Active member of club or organization: Not on file    Attends meetings of clubs or organizations: Not on file    Relationship status: Not on file  . Intimate partner violence    Fear of current or ex partner: Not on file    Emotionally abused: Not on file    Physically abused: Not on file    Forced sexual activity: Not on file  Other Topics Concern  . 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  Generalized: Well developed, in no acute distress  CHEST: Lungs clear to auscultation bilaterally  Neurological examination  Mentation: Alert oriented to time, place, history taking. Follows all commands speech and language fluent Cranial nerve II-XII:  Extraocular movements were full, visual field were full on confrontational test.Head turning and shoulder shrug  were normal and symmetric. Motor:  The motor testing reveals 5 over 5 strength of all 4 extremities. Good symmetric motor tone is noted throughout.  Sensory: Sensory testing is intact to soft touch on all 4 extremities. No evidence of extinction is noted.  Coordination: Cerebellar testing reveals good finger-nose-finger and heel-to-shin bilaterally.  Gait and station: Gait is normal.   DIAGNOSTIC DATA (LABS, IMAGING, TESTING) - I reviewed patient records, labs, notes, testing and imaging myself where available.  Lab Results  Component Value Date   WBC 7.2 10/02/2018   HGB 13.6 10/02/2018   HCT 40.4 10/02/2018   MCV 92 10/02/2018   PLT 230 10/02/2018      Component Value Date/Time   NA 139 10/02/2018 1324   K 4.5 10/02/2018 1324   CL 101 10/02/2018 1324   CO2 25 10/02/2018 1324   GLUCOSE 75 10/02/2018 1324   GLUCOSE 72 12/28/2017 1208   BUN 24 10/02/2018 1324   CREATININE 0.69 10/02/2018 1324   CALCIUM 9.1 10/02/2018 1324   PROT 6.7 10/02/2018 1324   ALBUMIN 4.2 10/02/2018 1324   AST 37 10/02/2018 1324   ALT 28 10/02/2018 1324   ALKPHOS 72 10/02/2018 1324   BILITOT 0.2 10/02/2018 1324   GFRNONAA 105 10/02/2018 1324   GFRAA 122 10/02/2018 1324   Lab Results  Component Value Date   CHOL 165 02/06/2017   HDL 54 02/06/2017   LDLCALC 94 02/06/2017   TRIG 87 02/06/2017   Lab Results  Component Value Date   HGBA1C 4.9 02/06/2017   No results found for: [id number] Lab Results  Component Value Date   TSH 1.12 07/10/2017  ASSESSMENT AND PLAN 48 y.o. year old female  has a past medical history of Anemia, Anxiety, Bipolar affective disorder (HCC), Blood transfusion without reported diagnosis, Bronchospasm with bronchitis, acute, Depression, Kidney stone, and SBO (small bowel obstruction) (HCC) (01/2014). here with:  1.  Obstructive sleep apnea on CPAP  Patient CPAP download shows excellent compliance and good treatment of her apnea.  She is encouraged to continue using CPAP nightly and greater than 4  hours each night.  She is advised that if her symptoms worsen or she develops new symptoms she should let us know.  She will follow-up in 1 year or sooner if needed    I spent 15 minutes with the patient. 50% of this time was spent reviewing her CPAP download   Butch PennyMegan Gean Larose, MSN, NP-C 12/02/2018, 11:04 AM [facility] [address], [address] [place], [place] [address] [phone]  I reviewed the above note and documentation by the Nurse Practitioner and agree with the history, exam, assessment and plan as outlined above. I was immediately available for consultation. Huston FoleySaima Athar, MD, PhD [facility]

## 2018-12-03 ENCOUNTER — Ambulatory Visit (INDEPENDENT_AMBULATORY_CARE_PROVIDER_SITE_OTHER): Payer: No Typology Code available for payment source | Admitting: Psychology

## 2018-12-03 DIAGNOSIS — F3132 Bipolar disorder, current episode depressed, moderate: Secondary | ICD-10-CM | POA: Diagnosis not present

## 2018-12-03 DIAGNOSIS — F422 Mixed obsessional thoughts and acts: Secondary | ICD-10-CM

## 2018-12-09 ENCOUNTER — Ambulatory Visit: Payer: No Typology Code available for payment source | Admitting: Neurology

## 2018-12-09 ENCOUNTER — Ambulatory Visit (INDEPENDENT_AMBULATORY_CARE_PROVIDER_SITE_OTHER): Payer: No Typology Code available for payment source | Admitting: Neurology

## 2018-12-09 ENCOUNTER — Other Ambulatory Visit: Payer: Self-pay

## 2018-12-09 ENCOUNTER — Ambulatory Visit (INDEPENDENT_AMBULATORY_CARE_PROVIDER_SITE_OTHER): Payer: No Typology Code available for payment source | Admitting: Psychology

## 2018-12-09 ENCOUNTER — Encounter: Payer: Self-pay | Admitting: Neurology

## 2018-12-09 DIAGNOSIS — F3132 Bipolar disorder, current episode depressed, moderate: Secondary | ICD-10-CM | POA: Diagnosis not present

## 2018-12-09 DIAGNOSIS — G5601 Carpal tunnel syndrome, right upper limb: Secondary | ICD-10-CM | POA: Diagnosis not present

## 2018-12-09 DIAGNOSIS — F422 Mixed obsessional thoughts and acts: Secondary | ICD-10-CM | POA: Diagnosis not present

## 2018-12-09 HISTORY — DX: Carpal tunnel syndrome, right upper limb: G56.01

## 2018-12-09 NOTE — Progress Notes (Signed)
Please refer to EMG and nerve conduction procedure note.  

## 2018-12-09 NOTE — Procedures (Signed)
     HISTORY:  Ashley Franklin is a 48 year old patient with a history of numbness in the right hand that wakes her up frequently at night.  She is dropping things on occasion, she denies any significant neck pain or pain down the arm.  She denies similar problems on the left hand.  She is being evaluated for possible neuropathy or a cervical radiculopathy.  NERVE CONDUCTION STUDIES:  Nerve conduction studies were performed on the right upper extremity.  The distal motor latency for the right median nerve was prolonged with a normal motor amplitude.  The distal motor latency motor activities for the right ulnar nerve were normal.  The sensory latencies with the right arm revealed an absent right median sensory latency with a normal right ulnar sensory latency.  The F-wave latency for the right ulnar nerve was normal.  EMG STUDIES:  EMG study was performed on the right upper extremity:  The first dorsal interosseous muscle reveals 2 to 4 K units with full recruitment. No fibrillations or positive waves were noted. The abductor pollicis brevis muscle reveals 2 to 4 K units with slightly decreased recruitment. No fibrillations or positive waves were noted. The extensor indicis proprius muscle reveals 1 to 3 K units with full recruitment. No fibrillations or positive waves were noted. The pronator teres muscle reveals 2 to 3 K units with full recruitment. No fibrillations or positive waves were noted. The biceps muscle reveals 1 to 2 K units with full recruitment. No fibrillations or positive waves were noted. The triceps muscle reveals 2 to 4 K units with full recruitment. No fibrillations or positive waves were noted. The anterior deltoid muscle reveals 2 to 3 K units with full recruitment. No fibrillations or positive waves were noted. The cervical paraspinal muscles were tested at 2 levels. No abnormalities of insertional activity were seen at either level tested. There was good relaxation.    IMPRESSION:  Nerve conduction studies done on the right upper extremity shows evidence of a right carpal tunnel syndrome of mild severity.  EMG evaluation of the right upper extremity shows no evidence of an overlying cervical radiculopathy.  Jill Alexanders MD 12/09/2018 1:38 PM  [facility] [address] [place],  [address]  Phone [phone] Fax [phone]

## 2018-12-09 NOTE — Progress Notes (Addendum)
The patient comes back to the office for EMG nerve conduction study, she has numbness involving the right hand, she denies any numbness of the left hand but she also just had recent surgery for lateral epicondylitis.  She will ask her current orthopedic surgeon if he does carpal tunnel surgery, if not the patient will contact her office in we will set this up.  She has tried a wrist splint for her right carpal tunnel without benefit.     [place]    Nerve / Sites Muscle Latency Ref. Amplitude Ref. Rel Amp Segments Distance Velocity Ref. Area    ms ms mV mV %  cm m/s m/s mVms  R Median - APB     Wrist APB 5.1 ?4.4 10.6 ?4.0 100 Wrist - APB 7   40.1     Upper arm APB 9.0  10.6  99.8 Upper arm - Wrist 20 51 ?49 39.0  R Ulnar - ADM     Wrist ADM 2.3 ?3.3 10.9 ?6.0 100 Wrist - ADM 7   32.2     B.Elbow ADM 5.3  10.9  100 B.Elbow - Wrist 17 56 ?49 31.7     A.Elbow ADM 7.1  10.6  97 A.Elbow - B.Elbow 10 55 ?49 31.2         A.Elbow - Wrist             SNC    Nerve / Sites Rec. Site Peak Lat Ref.  Amp Ref. Segments Distance    ms ms V V  cm  R Median - Orthodromic (Dig II, Mid palm)     Dig II Wrist NR ?3.4 NR ?10 Dig II - Wrist 13  R Ulnar - Orthodromic, (Dig V, Mid palm)     Dig V Wrist 2.5 ?3.1 8 ?5 Dig V - Wrist 83         F  Wave    Nerve F Lat Ref.   ms ms  R Ulnar - ADM 25.3 ?32.0

## 2018-12-10 ENCOUNTER — Encounter: Payer: Self-pay | Admitting: Physical Therapy

## 2018-12-10 ENCOUNTER — Other Ambulatory Visit: Payer: Self-pay

## 2018-12-10 ENCOUNTER — Ambulatory Visit: Payer: PRIVATE HEALTH INSURANCE | Attending: Orthopedic Surgery | Admitting: Physical Therapy

## 2018-12-10 DIAGNOSIS — M25632 Stiffness of left wrist, not elsewhere classified: Secondary | ICD-10-CM | POA: Diagnosis present

## 2018-12-10 DIAGNOSIS — M25522 Pain in left elbow: Secondary | ICD-10-CM | POA: Insufficient documentation

## 2018-12-10 DIAGNOSIS — M6281 Muscle weakness (generalized): Secondary | ICD-10-CM | POA: Insufficient documentation

## 2018-12-10 DIAGNOSIS — M25622 Stiffness of left elbow, not elsewhere classified: Secondary | ICD-10-CM | POA: Diagnosis present

## 2018-12-10 NOTE — Therapy (Signed)
[facility] [address]  [address] [place], [place], [address] Phone: [phone]   Fax:  [phone]  Physical Therapy Evaluation  Patient Details  Name: Ashley Franklin MRN: [id number] Date of Birth: February 14, 1971 Referring Provider (PT): Mack Hookavid Thompson, MD   Encounter Date: 12/10/2018  PT End of Session - 12/10/18 1530    Visit Number  1    Number of Visits  13    Date for PT Re-Evaluation  01/21/19    Authorization Type  WC    PT Start Time  1448    PT Stop Time  1524    PT Time Calculation (min)  36 min    Activity Tolerance  Patient tolerated treatment well    Behavior During Therapy  [facility] for tasks assessed/performed       Past Medical History:  Diagnosis Date  . Anemia   . Anxiety   . Bipolar affective disorder (HCC)   . Blood transfusion without reported diagnosis   . Bronchospasm with bronchitis, acute   . Depression   . Kidney stone   . Right carpal tunnel syndrome 12/09/2018  . SBO (small bowel obstruction) (HCC) 01/2014    Past Surgical History:  Procedure Laterality Date  . CESAREAN SECTION    . ENDOMETRIAL ABLATION    . GASTRIC BYPASS  2001  . SBO  2015    There were no vitals filed for this visit.   Subjective Assessment - 12/10/18 1450    Subjective  Patient reports undergoing L lateral epicondyle debridement on 11/29/18. Told to wear wrist brace until next MD appointment which will be in 4 weeks. Pain levels have been high since surgery d/t pain meds not giving her relief. Does get relief with ice. Wore cast until today, when she was transferred to a wrist brace. Pain is located over L lateral epicondyle. Denies N/T. Would like to work on improving her grasp, lifting drink up to her mouth, placing pocketbook onto shoulder. Work restrictions- no lifting other than pen, paper, eating utensil. Wear brace as much as possible.    Pertinent History  kidney stone, depression, bipolar, anxiety, anemia     Limitations  Lifting;House hold activities    Patient Stated Goals  return to work    Currently in Pain?  Yes    Pain Score  3     Pain Location  Elbow    Pain Orientation  Left;Lateral    Pain Descriptors / Indicators  Sharp    Pain Type  Acute pain;Surgical pain         [facility] PT Assessment - 12/10/18 1456      Assessment   Medical Diagnosis  s/p L lateral epicondylitis debridement    Referring Provider (PT)  Mack Hookavid Thompson, MD    Onset Date/Surgical Date  11/29/18    Hand Dominance  Right    Next MD Visit  01/07/19    Prior Therapy  yes- pre surgery      Precautions   Precautions  --   L wrist brace, no lifting     Balance Screen   Has the patient fallen in the past 6 months  No    Has the patient had a decrease in activity level because of a fear of falling?   No    Is the patient reluctant to leave their home because of a fear of falling?   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, taking blood and lifting/positioning arms, opening pneumatic tubes    Leisure  regular housework      Cognition   Overall Cognitive Status  Within Functional Limits for tasks assessed      Observation/Other Assessments   Observations  L wrist in brace; L elbow incision covered by 2 steri strips      Sensation   Light Touch  Appears Intact      Coordination   Gross Motor Movements are Fluid and Coordinated  Yes      Posture/Postural Control   Posture/Postural Control  Postural limitations    Postural Limitations  Rounded Shoulders;Forward head;Posterior pelvic tilt      ROM / Strength   AROM / PROM / Strength  AROM;PROM;Strength      AROM   AROM Assessment Site  Wrist;Elbow    Right/Left Elbow  Left;Right    Right Elbow Flexion  146    Right Elbow Extension  4    Left Elbow Flexion  10     Left Elbow Extension  112    Right/Left Wrist  Left    Right Wrist Extension  70 Degrees    Right Wrist Flexion  80 Degrees    Right Wrist Radial Deviation  24 Degrees    Right Wrist Ulnar Deviation  40 Degrees    Left Wrist Extension  --    Left Wrist Flexion  --    Left Wrist Radial Deviation  --    Left Wrist Ulnar Deviation  --      PROM   PROM Assessment Site  Wrist    Right/Left Wrist  Left    Left Wrist Extension  62 Degrees   pain   Left Wrist Flexion  59 Degrees    Left Wrist Radial Deviation  19 Degrees    Left Wrist Ulnar Deviation  22 Degrees      Strength   Strength Assessment Site  Shoulder;Elbow;Forearm;Wrist;Hand    Right/Left Shoulder  Right;Left    Right Shoulder Flexion  4+/5    Right Shoulder ABduction  4/5    Left Shoulder Flexion  4+/5    Left Shoulder ABduction  4+/5    Left Shoulder Internal Rotation  4+/5    Left Shoulder External Rotation  4+/5    Right/Left Elbow  Right    Right Elbow Flexion  4+/5    Right Elbow Extension  4+/5    Right/Left Forearm  Right    Right Forearm Pronation  4+/5    Right Forearm Supination  4+/5    Right/Left Wrist  Right    Right Wrist Flexion  4+/5    Right Wrist Extension  4/5    Right Wrist Radial Deviation  4+/5    Right Wrist Ulnar Deviation  4+/5    Right/Left hand  Right    Right Hand Grip (lbs)  45                Objective measurements completed on examination: See above findings.              PT Education - 12/10/18 1525    Education Details  prognosis, POC, HEP    Person(s) Educated  Patient    Methods  Explanation;Demonstration;Tactile cues;Verbal cues;Handout    Comprehension  Verbalized understanding;Returned demonstration       PT Short Term Goals - 12/10/18 1537      PT SHORT TERM GOAL #1   Title  Independent w/ inital HEP     Time  3    Period  Weeks    Status  New    Target Date  12/31/18        PT Long Term Goals - 12/10/18 1538      PT LONG TERM GOAL  #1   Title  Independent w/ advanced HEP.    Time  6    Period  Weeks    Status  New    Target Date  01/21/19      PT LONG TERM GOAL #2   Title  Pt. will demonstrate an increase in Lt elbow and wrist AROM to WNL to increase function.    Time  6    Period  Weeks    Status  New    Target Date  01/21/19      PT LONG TERM GOAL #3   Title  Pt. will demonstrate increase in L elbow/wrist strength to 5/5 and Lt grip strength to >40 lbs to allow for increased elbow stability and functional use of arm.    Time  6    Period  Weeks    Status  New    Target Date  01/21/19      PT LONG TERM GOAL #4   Title  Patient to report 90% improvement in L elbow pain.    Time  6    Period  Weeks    Status  New    Target Date  01/21/19      PT LONG TERM GOAL #5   Title  Pt will be able to perform her ususal ADL's and work duties with less than 2/10 overall pain.    Time  6    Period  Weeks    Status  New    Target Date  01/21/19             Plan - 12/10/18 1531    Clinical Impression Statement  Patient is a 48y/o F presenting to OPPT with c/o L lateral elbow pain s/p L lateral epicondyle debridement on 11/29/18. Advised to wear wrist brace until next MD F/U in 4 weeks. Would like to work on improving her grasp, lifting drink up to her mouth, placing pocketbook onto her shoulder. Patient today with limited L elbow and wrist AROM/PROM, respectively. Also demonstrating rounded shoulder and forward head posturing with R shoulder elevation. Strength and grip testing not assessed d/t surgical precautions. Patient educated on gentle stretching HEP and advised not to push into pain- patient reported understanding. Would benefit from skilled PT services 2x/week for 6 weeks to address aforementioned impairments.    Personal Factors and Comorbidities  Comorbidity 3+;Fitness;Past/Current Experience;Profession;Time since onset of injury/illness/exacerbation    Comorbidities  kidney stone, depression, bipolar,  anxiety, anemia    Examination-Activity Limitations  Caring for Others;Carry;Hygiene/Grooming;Lift    Examination-Participation Restrictions  Cleaning;Shop;Driving;Yard Work;Interpersonal Relationship;Laundry;Meal Prep    Stability/Clinical Decision Making  Stable/Uncomplicated    Clinical Decision Making  Low    Rehab Potential  Good    PT Frequency  2x / week    PT Duration  6 weeks    PT Treatment/Interventions  ADLs/Self Care Home Management;Cryotherapy;Electrical Stimulation;Moist Heat;Therapeutic exercise;Therapeutic activities;Ultrasound;Neuromuscular re-education;Patient/family education;Manual techniques;Vasopneumatic Device;Taping;Splinting;Energy conservation;Dry needling;Passive range of motion;Scar mobilization    PT Next Visit Plan  reassess  HEP    Consulted and Agree with Plan of Care  Patient       Patient will benefit from skilled therapeutic intervention in order to improve the following deficits and impairments:  Hypomobility, Increased edema, Decreased scar mobility, Decreased activity tolerance, Increased fascial restricitons, Pain, Impaired UE functional use, Improper body mechanics, Decreased range of motion, Impaired flexibility, Postural dysfunction  Visit Diagnosis: 1. Pain in left elbow   2. Stiffness of left elbow, not elsewhere classified   3. Stiffness of left wrist, not elsewhere classified   4. Muscle weakness (generalized)        Problem List Patient Active Problem List   Diagnosis Date Noted  . Right carpal tunnel syndrome 12/09/2018  . Closed nondisplaced fracture of fifth left metatarsal bone 05/10/2018  . History of shingles 01/08/2018  . Menopause 01/08/2018  . Left ankle injury, subsequent encounter 08/04/2017  . PCP NOTES >>>>>>>>>> 07/11/2017  . 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 Chronic knee pain 04/18/2015  . Plantar fasciitis 04/18/2015  . Tobacco use disorder 03/15/2015  . Morbid obesity (HCC) 01/05/2015    Anette GuarneriYevgeniya Kovalenko, PT,  DPT 12/10/18 3:45 PM   [facility] [address]  [address] [place], [place], [address] Phone: [phone]   Fax:  [phone]  Name: Ashley OatsJennifer S Franklin MRN: [id number] Date of Birth: 05/01/1971

## 2018-12-12 ENCOUNTER — Other Ambulatory Visit: Payer: Self-pay

## 2018-12-12 ENCOUNTER — Encounter: Payer: Self-pay | Admitting: Physical Therapy

## 2018-12-12 ENCOUNTER — Ambulatory Visit: Payer: PRIVATE HEALTH INSURANCE | Admitting: Physical Therapy

## 2018-12-12 DIAGNOSIS — M25522 Pain in left elbow: Secondary | ICD-10-CM | POA: Diagnosis not present

## 2018-12-12 DIAGNOSIS — M6281 Muscle weakness (generalized): Secondary | ICD-10-CM

## 2018-12-12 DIAGNOSIS — M25622 Stiffness of left elbow, not elsewhere classified: Secondary | ICD-10-CM

## 2018-12-12 DIAGNOSIS — M25632 Stiffness of left wrist, not elsewhere classified: Secondary | ICD-10-CM

## 2018-12-12 NOTE — Therapy (Signed)
[facility] [address]  [place], [place], [address] Phone: [phone]   Fax:  [phone]  Physical Therapy Treatment  Patient Details  Name: Ashley Franklin MRN: [id number] Date of Birth: 1971/05/10 Referring Provider (PT): Milly Jakob, MD   Encounter Date: 12/12/2018  PT End of Session - 12/12/18 0928    Visit Number  2    Number of Visits  13    Date for PT Re-Evaluation  01/21/19    Authorization Type  WC: 1 eval + 8 tx    Authorization - Visit Number  2    Authorization - Number of Visits  9    PT Start Time  [phone]    PT Stop Time  0934   ice pack   PT Time Calculation (min)  52 min    Activity Tolerance  Patient tolerated treatment well;Patient limited by pain    Behavior During Therapy  [facility] for tasks assessed/performed       Past Medical History:  Diagnosis Date  . Anemia   . Anxiety   . Bipolar affective disorder ([place])   . Blood transfusion without reported diagnosis   . Bronchospasm with bronchitis, acute   . Depression   . Kidney stone   . Right carpal tunnel syndrome 12/09/2018  . SBO (small bowel obstruction) ([place]) 01/2014    Past Surgical History:  Procedure Laterality Date  . CESAREAN SECTION    . ENDOMETRIAL ABLATION    . GASTRIC BYPASS  2001  . SBO  2015    There were no vitals filed for this visit.  Subjective Assessment - 12/12/18 0844    Subjective  Reports that pain levels increase with HEP. Most difficulty with wrist extension stretch. Has also been icing. Bumped her elbow on the car door this AM.    Pertinent History  kidney stone, depression, bipolar, anxiety, anemia    Patient Stated Goals  return to work    Currently in Pain?  Yes    Pain Score  3     Pain Location  Elbow    Pain Orientation  Left;Lateral    Pain Descriptors / Indicators  Aching    Pain Type  Acute pain;Surgical pain                       [facility] Adult PT Treatment/Exercise -  12/12/18 0001      Exercises   Exercises  Shoulder;Wrist;Elbow      Elbow Exercises   Elbow Flexion  AROM;Left;10 reps;Seated    Elbow Flexion Limitations  to tolerance with 3" hold    Elbow Extension  AROM;Left;10 reps;Seated    Elbow Extension Limitations  to tolerance with 3" hold    Other elbow exercises  L bicep curl with wrist brace on and 1# x10; supported lowering with R UE to avoid pain      Shoulder Exercises: Seated   Other Seated Exercises  scapular retraction 10x3"      Wrist Exercises   Wrist Flexion  PROM;Left;5 reps;Seated    Wrist Flexion Limitations  5x10" to tolerance with elbow bent and supported on table    Wrist Extension  PROM;Left;10 reps;Seated    Wrist Extension Limitations  5x10" to tolerance with elbow bent and supported on table; elbow supinated and elbow pronated   good tolerance   Wrist Radial Deviation  PROM;Left;5 reps;Seated    Wrist Radial Deviation Limitations  with elbow supported;  to tolerance 5"x10    Wrist Ulnar Deviation  PROM;Left;5 reps;Seated    Wrist Ulnar Deviation Limitations  with elbow supported; to tolerance 5"x10      Modalities   Modalities  Cryotherapy      Cryotherapy   Number Minutes Cryotherapy  10 Minutes    Cryotherapy Location  --   L elbow   Type of Cryotherapy  Ice pack      Manual Therapy   Manual Therapy  Passive ROM    Manual therapy comments  sitting    Passive ROM  L elbow flexion/extension and pronation/supination to tolerance              PT Education - 12/12/18 0927    Education Details  update to HEP    Person(s) Educated  Patient    Methods  Explanation;Demonstration;Tactile cues;Verbal cues;Handout    Comprehension  Verbalized understanding;Returned demonstration       PT Short Term Goals - 12/12/18 1215      PT SHORT TERM GOAL #1   Title  Independent w/ inital HEP     Time  3    Period  Weeks    Status  On-going    Target Date  12/31/18        PT Long Term Goals - 12/12/18 1215       PT LONG TERM GOAL #1   Title  Independent w/ advanced HEP.    Time  6    Period  Weeks    Status  On-going      PT LONG TERM GOAL #2   Title  Pt. will demonstrate an increase in Lt elbow and wrist AROM to WNL to increase function.    Time  6    Period  Weeks    Status  On-going      PT LONG TERM GOAL #3   Title  Pt. will demonstrate increase in L elbow/wrist strength to 5/5 and Lt grip strength to >40 lbs to allow for increased elbow stability and functional use of arm.    Time  6    Period  Weeks    Status  On-going      PT LONG TERM GOAL #4   Title  Patient to report 90% improvement in L elbow pain.    Time  6    Period  Weeks    Status  On-going      PT LONG TERM GOAL #5   Title  Pt will be able to perform her ususal ADL's and work duties with less than 2/10 overall pain.    Time  6    Period  Weeks    Status  On-going            Plan - 12/12/18 1211    Clinical Impression Statement  Patient reporting compliance with HEP and experiencing some pain with L wrist extension stretching at home. Reviewed HEP with patient and provided correction of form and modification as needed. Patient reporting improvement in comfort of wrist extension stretch with elbow bent and supported on table rather than elbow extended, thus advised her to perform with bent elbow. Also advised patient to continue performing wrist PROM with assistance of R UE rather than active movement at this time, as patient demonstrating AROM with HEP review today. Patient reported understanding. Showing slight improvement in elbow AROM at end ranges today. Able to progress to concentric bicep curl with passive lowering to protect lateral epicondyle. Ended session with ice  pack to elbow for pain relief. Patient without complaint at end of session.    Comorbidities  kidney stone, depression, bipolar, anxiety, anemia    PT Treatment/Interventions  ADLs/Self Care Home Management;Cryotherapy;Electrical  Stimulation;Moist Heat;Therapeutic exercise;Therapeutic activities;Ultrasound;Neuromuscular re-education;Patient/family education;Manual techniques;Vasopneumatic Device;Taping;Splinting;Energy conservation;Dry needling;Passive range of motion;Scar mobilization    PT Next Visit Plan  progress per protocol    Consulted and Agree with Plan of Care  Patient       Patient will benefit from skilled therapeutic intervention in order to improve the following deficits and impairments:  Hypomobility, Increased edema, Decreased scar mobility, Decreased activity tolerance, Increased fascial restricitons, Pain, Impaired UE functional use, Improper body mechanics, Decreased range of motion, Impaired flexibility, Postural dysfunction  Visit Diagnosis: 1. Pain in left elbow   2. Stiffness of left elbow, not elsewhere classified   3. Stiffness of left wrist, not elsewhere classified   4. Muscle weakness (generalized)        Problem List Patient Active Problem List   Diagnosis Date Noted  . Right carpal tunnel syndrome 12/09/2018  . Closed nondisplaced fracture of fifth left metatarsal bone 05/10/2018  . History of shingles 01/08/2018  . Menopause 01/08/2018  . Left ankle injury, subsequent encounter 08/04/2017  . PCP NOTES >>>>>>>>>> 07/11/2017  . 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 Chronic knee pain 04/18/2015  . Plantar fasciitis 04/18/2015  . Tobacco use disorder 03/15/2015  . Morbid obesity (HCC) 01/05/2015     Anette GuarneriYevgeniya Taheem Fricke, PT, DPT 12/12/18 12:17 PM   [facility] [place] [address]  [address] [place], [place], [address] Phone: [phone]   Fax:  [phone]  Name: Dewaine OatsJennifer S Longshore MRN: [id number] Date of Birth: 1970/11/15

## 2018-12-16 ENCOUNTER — Other Ambulatory Visit: Payer: Self-pay

## 2018-12-16 ENCOUNTER — Ambulatory Visit: Payer: PRIVATE HEALTH INSURANCE | Attending: Orthopedic Surgery | Admitting: Physical Therapy

## 2018-12-16 ENCOUNTER — Encounter: Payer: Self-pay | Admitting: Physical Therapy

## 2018-12-16 DIAGNOSIS — M25522 Pain in left elbow: Secondary | ICD-10-CM | POA: Insufficient documentation

## 2018-12-16 DIAGNOSIS — M25622 Stiffness of left elbow, not elsewhere classified: Secondary | ICD-10-CM | POA: Diagnosis present

## 2018-12-16 DIAGNOSIS — M25632 Stiffness of left wrist, not elsewhere classified: Secondary | ICD-10-CM | POA: Insufficient documentation

## 2018-12-16 DIAGNOSIS — M6281 Muscle weakness (generalized): Secondary | ICD-10-CM | POA: Insufficient documentation

## 2018-12-16 MED FILL — oxyCODONE HCL 5 MG TABS: 5 | 5 days supply | Qty: 20 | Fill #0

## 2018-12-16 NOTE — Therapy (Signed)
[facility] [address]  [place], [place], [address] Phone: [phone]   Fax:  [phone]  Physical Therapy Treatment  Patient Details  Name: Ashley Franklin MRN: [id number] Date of Birth: Jun 22, 1970 Referring Provider (PT): Milly Jakob, MD   Encounter Date: 12/16/2018  PT End of Session - 12/16/18 1457    Visit Number  3    Number of Visits  13    Date for PT Re-Evaluation  01/21/19    Authorization Type  WC: 1 eval + 8 tx    Authorization - Visit Number  3    Authorization - Number of Visits  9    PT Start Time  7035    PT Stop Time  1452    PT Time Calculation (min)  45 min    Equipment Utilized During Treatment  Other (comment)   L wrist brace   Activity Tolerance  Patient tolerated treatment well    Behavior During Therapy  [facility] for tasks assessed/performed       Past Medical History:  Diagnosis Date  . Anemia   . Anxiety   . Bipolar affective disorder ([place])   . Blood transfusion without reported diagnosis   . Bronchospasm with bronchitis, acute   . Depression   . Kidney stone   . Right carpal tunnel syndrome 12/09/2018  . SBO (small bowel obstruction) ([place]) 01/2014    Past Surgical History:  Procedure Laterality Date  . CESAREAN SECTION    . ENDOMETRIAL ABLATION    . GASTRIC BYPASS  2001  . SBO  2015    There were no vitals filed for this visit.  Subjective Assessment - 12/16/18 1407    Subjective  Reports no new issues. Pain is staying at about a 3/10.    Pertinent History  kidney stone, depression, bipolar, anxiety, anemia    Patient Stated Goals  return to work    Currently in Pain?  Yes    Pain Score  3     Pain Location  Elbow    Pain Orientation  Left;Lateral    Pain Descriptors / Indicators  Sharp    Pain Type  Acute pain;Surgical pain                       [facility] Adult PT Treatment/Exercise - 12/16/18 0001      Elbow Exercises   Elbow Flexion   AROM;Left;10 reps;Seated    Elbow Flexion Limitations  L elbow flexion/extension AROM + PT OP   TTP over olecranon and feeling of catching with extension   Other elbow exercises  L bicep curl with ankle weight on L forearm 1# x10; supported lowering with R UE to avoid pain      Wrist Exercises   Other wrist exercises  L wrist flexion/extension isometrics 10x5"   with elbow bent and wrsit neutral; 10 contraction     Manual Therapy   Manual Therapy  Soft tissue mobilization    Soft tissue mobilization  L lateral epicondyle incision scar massage; STM along extensors    Passive ROM  L wrsit flexion/extension and ulnar/radial deviation to tolerance             PT Education - 12/16/18 1457    Education Details  update to HEP; edu on protocol and rehab course d/t patient's concern about R carpal tunnel pain    Person(s) Educated  Patient    Methods  Explanation;Demonstration;Tactile cues;Verbal  cues;Handout    Comprehension  Verbalized understanding;Returned demonstration       PT Short Term Goals - 12/16/18 1507      PT SHORT TERM GOAL #1   Title  Independent w/ inital HEP     Time  3    Period  Weeks    Status  Achieved    Target Date  12/31/18        PT Long Term Goals - 12/12/18 1215      PT LONG TERM GOAL #1   Title  Independent w/ advanced HEP.    Time  6    Period  Weeks    Status  On-going      PT LONG TERM GOAL #2   Title  Pt. will demonstrate an increase in Lt elbow and wrist AROM to WNL to increase function.    Time  6    Period  Weeks    Status  On-going      PT LONG TERM GOAL #3   Title  Pt. will demonstrate increase in L elbow/wrist strength to 5/5 and Lt grip strength to >40 lbs to allow for increased elbow stability and functional use of arm.    Time  6    Period  Weeks    Status  On-going      PT LONG TERM GOAL #4   Title  Patient to report 90% improvement in L elbow pain.    Time  6    Period  Weeks    Status  On-going      PT LONG TERM  GOAL #5   Title  Pt will be able to perform her ususal ADL's and work duties with less than 2/10 overall pain.    Time  6    Period  Weeks    Status  On-going            Plan - 12/16/18 1458    Clinical Impression Statement  Patient arrived to session with no new complaints. Reporting HEP is going well and with improved comfort since last HEP update. L elbow incision is well-healed and without steri-strips remaining. Performed scar massage and STM over L wrist extensors- patient reported mild sensitivity over incision but able to tolerate. Educated patient on scar massage for improved comfort and tissue extensibility. Able to tolerate wrist PROM in all directions with good ROM. Patient received OP at end ranges of elbow AROM with report of "catching" at olecranon with mid-range extension which resolved with full extension. Introduced very gentle wrist isometrics with cues to maintain wrist in neutral.  Discussed patient's protocol and rehab course d/t patient's concern about R carpal tunnel pain. Patient concerned that she may have used R UE to compensate for hurting L UE, causing CTS. Updated HEP with gentle isometrics- patient reported understanding and with no complaints at end of session.    Comorbidities  kidney stone, depression, bipolar, anxiety, anemia    PT Treatment/Interventions  ADLs/Self Care Home Management;Cryotherapy;Electrical Stimulation;Moist Heat;Therapeutic exercise;Therapeutic activities;Ultrasound;Neuromuscular re-education;Patient/family education;Manual techniques;Vasopneumatic Device;Taping;Splinting;Energy conservation;Dry needling;Passive range of motion;Scar mobilization    PT Next Visit Plan  progress per protocol    Consulted and Agree with Plan of Care  Patient       Patient will benefit from skilled therapeutic intervention in order to improve the following deficits and impairments:  Hypomobility, Increased edema, Decreased scar mobility, Decreased activity  tolerance, Increased fascial restricitons, Pain, Impaired UE functional use, Improper body mechanics, Decreased range of motion, Impaired flexibility,  Postural dysfunction  Visit Diagnosis: 1. Pain in left elbow   2. Stiffness of left elbow, not elsewhere classified   3. Stiffness of left wrist, not elsewhere classified   4. Muscle weakness (generalized)        Problem List Patient Active Problem List   Diagnosis Date Noted  . Right carpal tunnel syndrome 12/09/2018  . Closed nondisplaced fracture of fifth left metatarsal bone 05/10/2018  . History of shingles 01/08/2018  . Menopause 01/08/2018  . Left ankle injury, subsequent encounter 08/04/2017  . PCP NOTES >>>>>>>>>> 07/11/2017  . 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 Chronic knee pain 04/18/2015  . Plantar fasciitis 04/18/2015  . Tobacco use disorder 03/15/2015  . Morbid obesity ([facility]) 01/05/2015     Anette GuarneriYevgeniya Lauraine Crespo, PT, DPT 12/16/18 3:09 PM   [facility] [address]  [address] [place], [place], [address] Phone: [phone]   Fax:  [phone]  Name: Dewaine OatsJennifer S Andreasen MRN: [id number] Date of Birth: 25-Sep-1970

## 2018-12-17 ENCOUNTER — Ambulatory Visit (INDEPENDENT_AMBULATORY_CARE_PROVIDER_SITE_OTHER): Payer: No Typology Code available for payment source | Admitting: Psychology

## 2018-12-17 DIAGNOSIS — F3132 Bipolar disorder, current episode depressed, moderate: Secondary | ICD-10-CM | POA: Diagnosis not present

## 2018-12-17 DIAGNOSIS — F422 Mixed obsessional thoughts and acts: Secondary | ICD-10-CM

## 2018-12-19 ENCOUNTER — Ambulatory Visit: Payer: PRIVATE HEALTH INSURANCE

## 2018-12-19 ENCOUNTER — Other Ambulatory Visit: Payer: Self-pay

## 2018-12-19 DIAGNOSIS — M25522 Pain in left elbow: Secondary | ICD-10-CM | POA: Diagnosis not present

## 2018-12-19 DIAGNOSIS — M25632 Stiffness of left wrist, not elsewhere classified: Secondary | ICD-10-CM

## 2018-12-19 DIAGNOSIS — M25622 Stiffness of left elbow, not elsewhere classified: Secondary | ICD-10-CM

## 2018-12-19 DIAGNOSIS — M6281 Muscle weakness (generalized): Secondary | ICD-10-CM

## 2018-12-19 NOTE — Therapy (Signed)
[facility] [place] [address]  [address] [place], [place], [address] Phone: [phone]   Fax:  [phone]  Physical Therapy Treatment  Patient Details  Name: Ashley OatsJennifer S Franklin MRN: [id number] Date of Birth: 19-Nov-1970 Referring Provider (PT): Mack Hookavid Thompson, MD   Encounter Date: 12/19/2018  PT End of Session - 12/19/18 1411    Visit Number  4    Number of Visits  13    Date for PT Re-Evaluation  01/21/19    Authorization Type  WC: 1 eval + 8 tx    Authorization - Visit Number  4    Authorization - Number of Visits  9    PT Start Time  1401    PT Stop Time  1445    PT Time Calculation (min)  44 min    Equipment Utilized During Treatment  Other (comment)   L wrist brace   Activity Tolerance  Patient tolerated treatment well    Behavior During Therapy  [facility] for tasks assessed/performed       Past Medical History:  Diagnosis Date  . Anemia   . Anxiety   . Bipolar affective disorder ([facility])   . Blood transfusion without reported diagnosis   . Bronchospasm with bronchitis, acute   . Depression   . Kidney stone   . Right carpal tunnel syndrome 12/09/2018  . SBO (small bowel obstruction) ([facility]) 01/2014    Past Surgical History:  Procedure Laterality Date  . CESAREAN SECTION    . ENDOMETRIAL ABLATION    . GASTRIC BYPASS  2001  . SBO  2015    There were no vitals filed for this visit.  Subjective Assessment - 12/19/18 1404    Subjective  Pt. noting she has been having some L wrist soreness however unsure of why this is.    Pertinent History  kidney stone, depression, bipolar, anxiety, anemia    Currently in Pain?  Yes    Pain Score  3    up to 5/10 at worst   Pain Location  Elbow    Pain Orientation  Left;Lateral    Pain Descriptors / Indicators  --   " bee sting"   Pain Type  Acute pain;Surgical pain    Multiple Pain Sites  No                       [facility] Adult PT Treatment/Exercise - 12/19/18 0001      Wrist Exercises   Wrist Flexion  PROM;Left;Seated;10 reps    Wrist Flexion Limitations  R UE assist with gentle hold at end range    5" hold    Wrist Extension  PROM;Left;10 reps;Seated    Wrist Extension Limitations  R UE assist with gentle hold at end ROM   5" hold    Other wrist exercises  L wrist AAROM flexion/extension with wand seated at table 3" x 10 resp     Other wrist exercises  L wrist flexion/extension isometrics 10x5"      Modalities   Modalities  Cryotherapy      Cryotherapy   Number Minutes Cryotherapy  8 Minutes    Cryotherapy Location  --   L elbow    Type of Cryotherapy  Ice massage      Manual Therapy   Manual Therapy  Soft tissue mobilization    Manual therapy comments  sitting    Soft tissue mobilization  L lateral epicondyle incision scar massage; STM  along extensors               PT Short Term Goals - 12/16/18 1507      PT SHORT TERM GOAL #1   Title  Independent w/ inital HEP     Time  3    Period  Weeks    Status  Achieved    Target Date  12/31/18        PT Long Term Goals - 12/12/18 1215      PT LONG TERM GOAL #1   Title  Independent w/ advanced HEP.    Time  6    Period  Weeks    Status  On-going      PT LONG TERM GOAL #2   Title  Pt. will demonstrate an increase in Lt elbow and wrist AROM to WNL to increase function.    Time  6    Period  Weeks    Status  On-going      PT LONG TERM GOAL #3   Title  Pt. will demonstrate increase in L elbow/wrist strength to 5/5 and Lt grip strength to >40 lbs to allow for increased elbow stability and functional use of arm.    Time  6    Period  Weeks    Status  On-going      PT LONG TERM GOAL #4   Title  Patient to report 90% improvement in L elbow pain.    Time  6    Period  Weeks    Status  On-going      PT LONG TERM GOAL #5   Title  Pt will be able to perform her ususal ADL's and work duties with less than 2/10 overall pain.    Time  6    Period  Weeks    Status  On-going             Plan - 12/19/18 1603    Clinical Impression Statement  Pt. reporting she is still having consistent L elbow pain averaging 3/10 rising to 5-6/10 at worst with use.  Tolerated continued wrist PROM, and initiation of gentle AAROM with wand into wrist flexion/extension today with subjective rise in elbow pain to 5/10.  Ended visit with ice massage to L elbow with some increased pain which was relieved following this. P. leaving session with pain returning to baseline.  Progressing well per protocol.    Personal Factors and Comorbidities  Comorbidity 3+;Fitness;Past/Current Experience;Profession;Time since onset of injury/illness/exacerbation    Comorbidities  kidney stone, depression, bipolar, anxiety, anemia    Rehab Potential  Good    PT Treatment/Interventions  ADLs/Self Care Home Management;Cryotherapy;Electrical Stimulation;Moist Heat;Therapeutic exercise;Therapeutic activities;Ultrasound;Neuromuscular re-education;Patient/family education;Manual techniques;Vasopneumatic Device;Taping;Splinting;Energy conservation;Dry needling;Passive range of motion;Scar mobilization    PT Next Visit Plan  progress per protocol    Consulted and Agree with Plan of Care  Patient       Patient will benefit from skilled therapeutic intervention in order to improve the following deficits and impairments:  Hypomobility, Increased edema, Decreased scar mobility, Decreased activity tolerance, Increased fascial restricitons, Pain, Impaired UE functional use, Improper body mechanics, Decreased range of motion, Impaired flexibility, Postural dysfunction  Visit Diagnosis: 1. Pain in left elbow   2. Stiffness of left elbow, not elsewhere classified   3. Stiffness of left wrist, not elsewhere classified   4. Muscle weakness (generalized)        Problem List Patient Active Problem List   Diagnosis Date Noted  . Right carpal  tunnel syndrome 12/09/2018  . Closed nondisplaced fracture of fifth left  metatarsal bone 05/10/2018  . History of shingles 01/08/2018  . Menopause 01/08/2018  . Left ankle injury, subsequent encounter 08/04/2017  . PCP NOTES >>>>>>>>>> 07/11/2017  . 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 Chronic knee pain 04/18/2015  . Plantar fasciitis 04/18/2015  . Tobacco use disorder 03/15/2015  . Morbid obesity ([place]) 01/05/2015    Ashley Franklin, Ashley Franklin 12/19/18 4:13 PM   [facility] [address]  [place], [place], [address] Phone: [phone]   Fax:  [phone]  Name: Ashley Franklin MRN: [id number] Date of Birth: 23-Oct-1970

## 2018-12-23 ENCOUNTER — Other Ambulatory Visit: Payer: Self-pay

## 2018-12-23 ENCOUNTER — Encounter: Payer: Self-pay | Admitting: Physical Therapy

## 2018-12-23 ENCOUNTER — Ambulatory Visit: Payer: PRIVATE HEALTH INSURANCE | Admitting: Physical Therapy

## 2018-12-23 DIAGNOSIS — M25632 Stiffness of left wrist, not elsewhere classified: Secondary | ICD-10-CM

## 2018-12-23 DIAGNOSIS — M25522 Pain in left elbow: Secondary | ICD-10-CM

## 2018-12-23 DIAGNOSIS — M25622 Stiffness of left elbow, not elsewhere classified: Secondary | ICD-10-CM

## 2018-12-23 DIAGNOSIS — M6281 Muscle weakness (generalized): Secondary | ICD-10-CM

## 2018-12-23 NOTE — Therapy (Signed)
[facility] [address]  [address] [place], [place], [address] Phone: [phone]   Fax:  [phone]  Physical Therapy Treatment  Patient Details  Name: Ashley OatsJennifer S Morissette MRN: [id number] Date of Birth: Dec 05, 1970 Referring Provider (PT): Mack Hookavid Thompson, MD   Encounter Date: 12/23/2018  PT End of Session - 12/23/18 1840    Visit Number  5    Number of Visits  13    Date for PT Re-Evaluation  01/21/19    Authorization Type  WC: 1 eval + 8 tx    Authorization - Visit Number  5    Authorization - Number of Visits  9    PT Start Time  1403    PT Stop Time  1446    PT Time Calculation (min)  43 min    Equipment Utilized During Treatment  Other (comment)   L wrist brace   Activity Tolerance  Patient tolerated treatment well;Patient limited by pain    Behavior During Therapy  [facility] for tasks assessed/performed       Past Medical History:  Diagnosis Date  . Anemia   . Anxiety   . Bipolar affective disorder ([facility])   . Blood transfusion without reported diagnosis   . Bronchospasm with bronchitis, acute   . Depression   . Kidney stone   . Right carpal tunnel syndrome 12/09/2018  . SBO (small bowel obstruction) ([facility]) 01/2014    Past Surgical History:  Procedure Laterality Date  . CESAREAN SECTION    . ENDOMETRIAL ABLATION    . GASTRIC BYPASS  2001  . SBO  2015    There were no vitals filed for this visit.  Subjective Assessment - 12/23/18 1404    Subjective  Reports that she is not doing well. Has been having severe pain in the L elbow with ADLs. Took pain meds before coming and drove here today. Has been icing and performing ice massage several times a day.    Pertinent History  kidney stone, depression, bipolar, anxiety, anemia    Patient Stated Goals  return to work    Currently in Pain?  Yes    Pain Score  8     Pain Location  Elbow    Pain Orientation  Left    Pain Type  Acute pain;Surgical pain                        [facility] Adult PT Treatment/Exercise - 12/23/18 0001      Manual Therapy   Manual Therapy  Soft tissue mobilization;Joint mobilization  (Pended)     Manual therapy comments  sitting    Joint Mobilization  L wrist and elbow gentle distraction to improve comfort with PROM  (Pended)     Soft tissue mobilization  L lateral epicondyle incision scar massage; STM along L wrist extensors, brachioradialis, lateral wrist flexors  (Pended)    palpable trigger points felt throughout; pt reporting tender   Passive ROM  L wrsit flexion/extension and ulnar/radial deviation to tolerance; L elbow flexion/extension to tolerance with prolonged holds at end range  [employer](Pended)              PT Education - 12/23/18 1839    Education Details  review/edu on previous HEP, ice massage, scar massage, normal rehab course    Person(s) Educated  Patient    Methods  Explanation;Demonstration;Verbal cues;Tactile cues    Comprehension  Verbalized understanding;Returned demonstration  PT Short Term Goals - 12/16/18 1507      PT SHORT TERM GOAL #1   Title  Independent w/ inital HEP     Time  3    Period  Weeks    Status  Achieved    Target Date  12/31/18        PT Long Term Goals - 12/12/18 1215      PT LONG TERM GOAL #1   Title  Independent w/ advanced HEP.    Time  6    Period  Weeks    Status  On-going      PT LONG TERM GOAL #2   Title  Pt. will demonstrate an increase in Lt elbow and wrist AROM to WNL to increase function.    Time  6    Period  Weeks    Status  On-going      PT LONG TERM GOAL #3   Title  Pt. will demonstrate increase in L elbow/wrist strength to 5/5 and Lt grip strength to >40 lbs to allow for increased elbow stability and functional use of arm.    Time  6    Period  Weeks    Status  On-going      PT LONG TERM GOAL #4   Title  Patient to report 90% improvement in L elbow pain.    Time  6    Period  Weeks    Status  On-going      PT  LONG TERM GOAL #5   Title  Pt will be able to perform her ususal ADL's and work duties with less than 2/10 overall pain.    Time  6    Period  Weeks    Status  On-going            Plan - 12/23/18 1840    Clinical Impression Statement  Patient arrived in 8/10 pain in L elbow. Reporting that she took 2 pain pills before the session and drove herself to the appointment. Anxious about a recent increase in L elbow pain over the weekend and after last session. Reports that she has continuously been performing her HEP more than was instructed and is afraid that she has been over-doing it. Reviewed previously administered HEP with patient and explained that isometric exercises should not be performed more than instructed to avoid lateral epicondyle pain. Also instructed patient on avoiding ulnar nerve with ice massage, performing this for no more than 5 min at a time. Reviewed scar massage and instructed patient on appropriate pressure to use. Focused session on manual therapy d/t patient's pain levels. Patient demonstrating palpable and tender trigger points throughout L wrist extensions and lateral wrist flexors. Tolerated gentle stretching to L wrist and elbow with addition of gentle distraction for improved comfort. Patient still reporting tightness at end range elbow extension. Advised patient to perform ice massage once home, and to have a family member drive her home. Patient agreeable, reporting her father in law will give her a ride. Patient reported 7/10 pain at end of session.    Personal Factors and Comorbidities  Comorbidity 3+;Fitness;Past/Current Experience;Profession;Time since onset of injury/illness/exacerbation    Comorbidities  kidney stone, depression, bipolar, anxiety, anemia    Rehab Potential  Good    PT Treatment/Interventions  ADLs/Self Care Home Management;Cryotherapy;Electrical Stimulation;Moist Heat;Therapeutic exercise;Therapeutic activities;Ultrasound;Neuromuscular  re-education;Patient/family education;Manual techniques;Vasopneumatic Device;Taping;Splinting;Energy conservation;Dry needling;Passive range of motion;Scar mobilization    PT Next Visit Plan  progress per protocol    Consulted and  Agree with Plan of Care  Patient       Patient will benefit from skilled therapeutic intervention in order to improve the following deficits and impairments:  Hypomobility, Increased edema, Decreased scar mobility, Decreased activity tolerance, Increased fascial restricitons, Pain, Impaired UE functional use, Improper body mechanics, Decreased range of motion, Impaired flexibility, Postural dysfunction  Visit Diagnosis: 1. Pain in left elbow   2. Stiffness of left elbow, not elsewhere classified   3. Stiffness of left wrist, not elsewhere classified   4. Muscle weakness (generalized)        Problem List Patient Active Problem List   Diagnosis Date Noted  . Right carpal tunnel syndrome 12/09/2018  . Closed nondisplaced fracture of fifth left metatarsal bone 05/10/2018  . History of shingles 01/08/2018  . Menopause 01/08/2018  . Left ankle injury, subsequent encounter 08/04/2017  . PCP NOTES >>>>>>>>>> 07/11/2017  . 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 Chronic knee pain 04/18/2015  . Plantar fasciitis 04/18/2015  . Tobacco use disorder 03/15/2015  . Morbid obesity ([place]) 01/05/2015    Janene Harvey, PT, DPT 12/23/18 [facility] [address]  [place], [place], [address] Phone: [phone]   Fax:  [phone]  Name: LORIN HAUCK MRN: [id number] Date of Birth: May 15, 1971

## 2018-12-26 ENCOUNTER — Other Ambulatory Visit: Payer: Self-pay

## 2018-12-26 ENCOUNTER — Ambulatory Visit: Payer: PRIVATE HEALTH INSURANCE

## 2018-12-26 DIAGNOSIS — M25522 Pain in left elbow: Secondary | ICD-10-CM | POA: Diagnosis not present

## 2018-12-26 DIAGNOSIS — M25622 Stiffness of left elbow, not elsewhere classified: Secondary | ICD-10-CM

## 2018-12-26 DIAGNOSIS — M6281 Muscle weakness (generalized): Secondary | ICD-10-CM

## 2018-12-26 DIAGNOSIS — M25632 Stiffness of left wrist, not elsewhere classified: Secondary | ICD-10-CM

## 2018-12-26 NOTE — Therapy (Signed)
[facility] [address]  [place], [place], [address] Phone: [phone]   Fax:  [phone]  Physical Therapy Treatment  Patient Details  Name: Ashley Franklin MRN: [id number] Date of Birth: 1970-06-15 Referring Provider (PT): Milly Jakob, MD   Encounter Date: 12/26/2018  PT End of Session - 12/26/18 1416    Visit Number  6    Number of Visits  13    Date for PT Re-Evaluation  01/21/19    Authorization Type  WC: 1 eval + 8 tx    Authorization - Visit Number  6    Authorization - Number of Visits  9    PT Start Time  6256    PT Stop Time  3893   Ended visit with 10 min moist heat to distal forearm musculature   PT Time Calculation (min)  53 min    Equipment Utilized During Treatment  Other (comment)   L wrist brace   Activity Tolerance  Patient tolerated treatment well;Patient limited by pain    Behavior During Therapy  [facility] for tasks assessed/performed       Past Medical History:  Diagnosis Date  . Anemia   . Anxiety   . Bipolar affective disorder ([place])   . Blood transfusion without reported diagnosis   . Bronchospasm with bronchitis, acute   . Depression   . Kidney stone   . Right carpal tunnel syndrome 12/09/2018  . SBO (small bowel obstruction) ([place]) 01/2014    Past Surgical History:  Procedure Laterality Date  . CESAREAN SECTION    . ENDOMETRIAL ABLATION    . GASTRIC BYPASS  2001  . SBO  2015    There were no vitals filed for this visit.  Subjective Assessment - 12/26/18 1406    Subjective  Reports she has been doing "100 reps" of exercises at home.  Pt. noting she will be starting "light duty" at work at [employer] starting tomorrow.    Pertinent History  kidney stone, depression, bipolar, anxiety, anemia    Patient Stated Goals  return to work    Currently in Pain?  Yes    Pain Location  Elbow    Pain Orientation  Left    Pain Descriptors / Indicators  --   Bee Sting"   Pain Type   Acute pain;Surgical pain    Multiple Pain Sites  No                       [facility] Adult PT Treatment/Exercise - 12/26/18 0001      Wrist Exercises   Wrist Flexion  PROM;Left;Seated;10 reps    Wrist Flexion Limitations  R UE assist   close monitoring from therapist to avoid overpressure   Wrist Extension  PROM;Left;10 reps;Seated    Wrist Extension Limitations  R UE assistance    close monitoring from therapist to avoid overpressure   Other wrist exercises  L wrist flexion/extension isometrics 10x5"      Cryotherapy   Number Minutes Cryotherapy  5 Minutes    Cryotherapy Location  --   L elbow    Type of Cryotherapy  Ice massage      Manual Therapy   Manual Therapy  Soft tissue mobilization;Joint mobilization;Myofascial release    Manual therapy comments  supine    Joint Mobilization  L wrist and elbow gentle distraction to improve comfort with PROM    Soft tissue mobilization  Gentle scar massage  x 1 min (pt. instructed in proper pressure and to avoid 5 min scar massage as to avoid irritaiton), STM/DTM to L wrist extensors, L biceps     Myofascial Release  TPR to L wrist extensors, L proximal biceps     Passive ROM  L wrsit flexion/extension and ulnar/radial deviation to tolerance; L elbow flexion/extension to tolerance with prolonged holds at end range   Heavy cueing for pt. to understand to avoid painful end rang            PT Education - 12/26/18 1624    Education Details  HEP update;  heavy instruction on proper scar massage + reference to handout with cueing to avoid >1 min and perform with "gentle" pressure as not to irritate incision    Person(s) Educated  Patient    Methods  Explanation;Demonstration;Verbal cues;Handout    Comprehension  Verbalized understanding;Returned demonstration;Verbal cues required       PT Short Term Goals - 12/16/18 1507      PT SHORT TERM GOAL #1   Title  Independent w/ inital HEP     Time  3    Period  Weeks     Status  Achieved    Target Date  12/31/18        PT Long Term Goals - 12/12/18 1215      PT LONG TERM GOAL #1   Title  Independent w/ advanced HEP.    Time  6    Period  Weeks    Status  On-going      PT LONG TERM GOAL #2   Title  Pt. will demonstrate an increase in Lt elbow and wrist AROM to WNL to increase function.    Time  6    Period  Weeks    Status  On-going      PT LONG TERM GOAL #3   Title  Pt. will demonstrate increase in L elbow/wrist strength to 5/5 and Lt grip strength to >40 lbs to allow for increased elbow stability and functional use of arm.    Time  6    Period  Weeks    Status  On-going      PT LONG TERM GOAL #4   Title  Patient to report 90% improvement in L elbow pain.    Time  6    Period  Weeks    Status  On-going      PT LONG TERM GOAL #5   Title  Pt will be able to perform her ususal ADL's and work duties with less than 2/10 overall pain.    Time  6    Period  Weeks    Status  On-going            Plan - 12/26/18 1417    Clinical Impression Statement  Pt. seen today reporting she took her pain medication before arriving to therapy and had family drive her to session today.  Pt. admitting to continued over-performance of HEP performing excessive repetitions and sets of all exercises and conversation revealed pt. performing scar massage for excessive duration and with excessive overpressure.  Pt. re-educated on proper technique with scar massage and heavily educated to avoid excessive reps and sets with ROM activities at home.  Pt. verbalized understanding of this.  Pt. did exhibit apprehensive throughout session today and verbalized anxiety of "losing motion and atrophy" of L UE despite therapist reassurance that pt. is proceeding as expected with protocol.  Pt. ended visit seemingly with  improved understanding of HEP and need to avoid stressing healing site post-surgery however may require further instruction with this.  Tolerated all gentle ROM  and MT per protocol well today.    Personal Factors and Comorbidities  Comorbidity 3+;Fitness;Past/Current Experience;Profession;Time since onset of injury/illness/exacerbation    Comorbidities  kidney stone, depression, bipolar, anxiety, anemia    Rehab Potential  Good    PT Treatment/Interventions  ADLs/Self Care Home Management;Cryotherapy;Electrical Stimulation;Moist Heat;Therapeutic exercise;Therapeutic activities;Ultrasound;Neuromuscular re-education;Patient/family education;Manual techniques;Vasopneumatic Device;Taping;Splinting;Energy conservation;Dry needling;Passive range of motion;Scar mobilization    PT Next Visit Plan  progress per protocol    Consulted and Agree with Plan of Care  Patient       Patient will benefit from skilled therapeutic intervention in order to improve the following deficits and impairments:  Hypomobility, Increased edema, Decreased scar mobility, Decreased activity tolerance, Increased fascial restricitons, Pain, Impaired UE functional use, Improper body mechanics, Decreased range of motion, Impaired flexibility, Postural dysfunction  Visit Diagnosis: 1. Pain in left elbow   2. Stiffness of left elbow, not elsewhere classified   3. Stiffness of left wrist, not elsewhere classified   4. Muscle weakness (generalized)        Problem List Patient Active Problem List   Diagnosis Date Noted  . Right carpal tunnel syndrome 12/09/2018  . Closed nondisplaced fracture of fifth left metatarsal bone 05/10/2018  . History of shingles 01/08/2018  . Menopause 01/08/2018  . Left ankle injury, subsequent encounter 08/04/2017  . PCP NOTES >>>>>>>>>> 07/11/2017  . 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 Chronic knee pain 04/18/2015  . Plantar fasciitis 04/18/2015  . Tobacco use disorder 03/15/2015  . Morbid obesity ([facility]) 01/05/2015    Kermit BaloMicah Zamier Eggebrecht, PTA 12/26/18 4:40 PM   [facility] [address]  [address] [place], [place], [address] Phone: [phone]   Fax:  [phone]  Name: [place] Franklin MRN: [id number] Date of Birth: 1971-01-24

## 2018-12-30 ENCOUNTER — Other Ambulatory Visit: Payer: Self-pay | Admitting: Neurology

## 2018-12-30 ENCOUNTER — Other Ambulatory Visit: Payer: Self-pay

## 2018-12-30 ENCOUNTER — Ambulatory Visit: Payer: PRIVATE HEALTH INSURANCE | Admitting: Physical Therapy

## 2018-12-30 ENCOUNTER — Encounter: Payer: Self-pay | Admitting: Physical Therapy

## 2018-12-30 DIAGNOSIS — M6281 Muscle weakness (generalized): Secondary | ICD-10-CM

## 2018-12-30 DIAGNOSIS — M25522 Pain in left elbow: Secondary | ICD-10-CM

## 2018-12-30 DIAGNOSIS — M25622 Stiffness of left elbow, not elsewhere classified: Secondary | ICD-10-CM

## 2018-12-30 DIAGNOSIS — M25632 Stiffness of left wrist, not elsewhere classified: Secondary | ICD-10-CM

## 2018-12-30 MED FILL — ZOLPIDEM TARTRATE 5 MG TAB: 5 | 30 days supply | Qty: 30 | Fill #0

## 2018-12-30 NOTE — Therapy (Signed)
[facility] [address]  [place], [place], [address] Phone: [phone]   Fax:  [phone]  Physical Therapy Treatment  Patient Details  Name: [place] Franklin MRN: [id number] Date of Birth: 1970/09/04 Referring Provider (PT): Milly Jakob, MD   Encounter Date: 12/30/2018  PT End of Session - 12/30/18 1440    Visit Number  7    Number of Visits  13    Date for PT Re-Evaluation  01/21/19    Authorization Type  WC: 1 eval + 8 tx    Authorization - Visit Number  7    Authorization - Number of Visits  9    PT Start Time  1401    PT Stop Time  1447    PT Time Calculation (min)  46 min    Equipment Utilized During Treatment  Other (comment)   L wrist brace   Activity Tolerance  Patient tolerated treatment well;Patient limited by pain    Behavior During Therapy  [facility] for tasks assessed/performed       Past Medical History:  Diagnosis Date  . Anemia   . Anxiety   . Bipolar affective disorder ([place])   . Blood transfusion without reported diagnosis   . Bronchospasm with bronchitis, acute   . Depression   . Kidney stone   . Right carpal tunnel syndrome 12/09/2018  . SBO (small bowel obstruction) ([place]) 01/2014    Past Surgical History:  Procedure Laterality Date  . CESAREAN SECTION    . ENDOMETRIAL ABLATION    . GASTRIC BYPASS  2001  . SBO  2015    There were no vitals filed for this visit.  Subjective Assessment - 12/30/18 1403    Subjective  Reports that she is not doing well and is discouraged by her continued pain with activities. Having pain when lifting a plate or pressing the volume button on her phone. Has cut back on the reps of exercises and well as the time of scar massage. 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    Pertinent History  kidney stone, depression, bipolar, anxiety, anemia    Patient Stated Goals  return to work    Currently in Pain?  Yes    Pain Score  7      Pain Location  Elbow    Pain Orientation  Left    Pain Descriptors / Indicators  Sharp    Pain Type  Acute pain;Surgical pain                       [facility] Adult PT Treatment/Exercise - 12/30/18 0001      Elbow Exercises   Other elbow exercises  L bicep curl 1# with wrist brace on 2x10   pain in lat epicondyle at near full extension      Wrist Exercises   Wrist Flexion  PROM;Left;Seated;5 reps    Wrist Flexion Limitations  with elbow extended and R UE assist   7/10 pain   Wrist Extension  PROM;Left;Seated;5 reps    Wrist Extension Limitations  with elbow extended and R UE assist   7/10 pain   Other wrist exercises  L wrist AAROM flexion/extension with wand seated at table 3" x 10 reps   10x with palms down, 10x with palms up     Manual Therapy   Manual Therapy  Soft tissue mobilization;Passive ROM    Soft tissue mobilization  STM to L brachioradialis and wrist extensors- tender throughout    Myofascial Release  TPR to L wrist extensors and brachioradialis    Passive ROM  L wrsit flexion/extension and ulnar/radial deviation to tolerance   mild L palmar wrist tingling with PROM            PT Education - 12/30/18 1440    Education Details  update to HEP    Person(s) Educated  Patient    Methods  Explanation;Demonstration;Tactile cues;Verbal cues;Handout    Comprehension  Verbalized understanding;Returned demonstration       PT Short Term Goals - 12/16/18 1507      PT SHORT TERM GOAL #1   Title  Independent w/ inital HEP     Time  3    Period  Weeks    Status  Achieved    Target Date  12/31/18        PT Long Term Goals - 12/12/18 1215      PT LONG TERM GOAL #1   Title  Independent w/ advanced HEP.    Time  6    Period  Weeks    Status  On-going      PT LONG TERM GOAL #2   Title  Pt. will demonstrate an increase in Lt elbow and wrist AROM to WNL to increase function.    Time  6    Period  Weeks    Status  On-going      PT LONG TERM  GOAL #3   Title  Pt. will demonstrate increase in L elbow/wrist strength to 5/5 and Lt grip strength to >40 lbs to allow for increased elbow stability and functional use of arm.    Time  6    Period  Weeks    Status  On-going      PT LONG TERM GOAL #4   Title  Patient to report 90% improvement in L elbow pain.    Time  6    Period  Weeks    Status  On-going      PT LONG TERM GOAL #5   Title  Pt will be able to perform her ususal ADL's and work duties with less than 2/10 overall pain.    Time  6    Period  Weeks    Status  On-going            Plan - 12/30/18 1635    Clinical Impression Statement  Patient reporting continued discouragement in her pain levels with functional activities. Notes that she is now on light duty at work and if having pain with simple activities such as donning gloves. Tolerated progression of biceps curls with AROM throughout concentric and eccentric phases. Also tolerated self- PROM and PROM with PT assistance with elbow extended without excessive increase in pain. Patient still intermittently reporting increase in L lateral epicondyle pain with certain movements and positions, but not a large change from baseline. Able to perform L wrist AAROM flexion/extension with wand with decreased ROM in L wrist but able to tolerate this activity better than previous session.  Updated HEP with exercises that were well-tolerated today. Patient reported understanding. Ended session with STM and TPR to L brachioradialis and wrist extensors which were still tight and painful. No further complaints at end of session.    Personal Factors and Comorbidities  Comorbidity 3+;Fitness;Past/Current Experience;Profession;Time since onset of injury/illness/exacerbation    Comorbidities  kidney stone, depression, bipolar, anxiety, anemia    Rehab Potential  Good  PT Treatment/Interventions  ADLs/Self Care Home Management;Cryotherapy;Electrical Stimulation;Moist Heat;Therapeutic  exercise;Therapeutic activities;Ultrasound;Neuromuscular re-education;Patient/family education;Manual techniques;Vasopneumatic Device;Taping;Splinting;Energy conservation;Dry needling;Passive range of motion;Scar mobilization    PT Next Visit Plan  progress per protocol    Consulted and Agree with Plan of Care  Patient       Patient will benefit from skilled therapeutic intervention in order to improve the following deficits and impairments:  Hypomobility, Increased edema, Decreased scar mobility, Decreased activity tolerance, Increased fascial restricitons, Pain, Impaired UE functional use, Improper body mechanics, Decreased range of motion, Impaired flexibility, Postural dysfunction  Visit Diagnosis: 1. Pain in left elbow   2. Stiffness of left elbow, not elsewhere classified   3. Stiffness of left wrist, not elsewhere classified   4. Muscle weakness (generalized)        Problem List Patient Active Problem List   Diagnosis Date Noted  . Right carpal tunnel syndrome 12/09/2018  . Closed nondisplaced fracture of fifth left metatarsal bone 05/10/2018  . History of shingles 01/08/2018  . Menopause 01/08/2018  . Left ankle injury, subsequent encounter 08/04/2017  . PCP NOTES >>>>>>>>>> 07/11/2017  . 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 Chronic knee pain 04/18/2015  . Plantar fasciitis 04/18/2015  . Tobacco use disorder 03/15/2015  . Morbid obesity ([facility]) 01/05/2015     Anette GuarneriYevgeniya Tauna Macfarlane, PT, DPT 12/30/18 4:43 PM   [facility] [address]  [address] [place], [place], [address] Phone: [phone]   Fax:  [phone]  Name: Dewaine OatsJennifer S Cosper MRN: [id number] Date of Birth: May 10, 1971

## 2018-12-31 ENCOUNTER — Ambulatory Visit: Payer: Self-pay | Admitting: Psychology

## 2018-12-31 MED FILL — GABAPENTIN 300 MG CAPSULE: 300 | 30 days supply | Qty: 30 | Fill #0

## 2019-01-02 ENCOUNTER — Ambulatory Visit: Payer: PRIVATE HEALTH INSURANCE

## 2019-01-02 ENCOUNTER — Other Ambulatory Visit: Payer: Self-pay

## 2019-01-02 DIAGNOSIS — M25622 Stiffness of left elbow, not elsewhere classified: Secondary | ICD-10-CM

## 2019-01-02 DIAGNOSIS — M6281 Muscle weakness (generalized): Secondary | ICD-10-CM

## 2019-01-02 DIAGNOSIS — M25522 Pain in left elbow: Secondary | ICD-10-CM

## 2019-01-02 DIAGNOSIS — M25632 Stiffness of left wrist, not elsewhere classified: Secondary | ICD-10-CM

## 2019-01-02 NOTE — Therapy (Signed)
[facility] [address]  [address] [place], [place], [address] Phone: [phone]   Fax:  [phone]  Physical Therapy Treatment  Patient Details  Name: [place] OatsJennifer S Gartland MRN: [id number] Date of Birth: [birth year] Referring Provider (PT): Mack Hookavid Thompson, MD   Encounter Date: 01/02/2019  PT End of Session - 01/02/19 1408    Visit Number  8    Number of Visits  13    Date for PT Re-Evaluation  01/21/19    Authorization Type  WC: 1 eval + 8 tx    Authorization - Visit Number  8    Authorization - Number of Visits  9    PT Start Time  1403    PT Stop Time  1455   Ended visit 10 min ice pack   PT Time Calculation (min)  52 min    Equipment Utilized During Treatment  Other (comment)   L wrist brace   Activity Tolerance  Patient tolerated treatment well;Patient limited by pain    Behavior During Therapy  [facility] for tasks assessed/performed       Past Medical History:  Diagnosis Date  . Anemia   . Anxiety   . Bipolar affective disorder ([facility])   . Blood transfusion without reported diagnosis   . Bronchospasm with bronchitis, acute   . Depression   . Kidney stone   . Right carpal tunnel syndrome 12/09/2018  . SBO (small bowel obstruction) ([facility]) 01/2014    Past Surgical History:  Procedure Laterality Date  . CESAREAN SECTION    . ENDOMETRIAL ABLATION    . GASTRIC BYPASS  2001  . SBO  2015    There were no vitals filed for this visit.  Subjective Assessment - 01/02/19 1406    Subjective  Pt. noting she feels her L elbow pain levels have not improved at all.  Feels like a " a hot branding/burning sensation".    Pertinent History  kidney stone, depression, bipolar, anxiety, anemia    Patient Stated Goals  return to work    Currently in Pain?  Yes    Pain Score  7    Up to 9/10 with biceps curl   Pain Location  Elbow    Pain Orientation  Left    Pain Descriptors / Indicators  Burning   "branding"   Pain Type  Acute  pain;Surgical pain    Multiple Pain Sites  No         [facility] PT Assessment - 01/02/19 0001      PROM   PROM Assessment Site  Wrist    Right/Left Wrist  Left    Left Wrist Extension  83 Degrees    Left Wrist Flexion  75 Degrees    Left Wrist Radial Deviation  18 Degrees    Left Wrist Ulnar Deviation  32 Degrees                   [facility] Adult PT Treatment/Exercise - 01/02/19 0001      Wrist Exercises   Wrist Flexion  10 reps;Left    Wrist Flexion Limitations  with elbow extended and R UE assist     Wrist Extension  PROM;Left;Seated;10 reps    Wrist Extension Limitations  with elbow extended and R UE assist    Wrist Radial Deviation  Left;10 reps;AAROM    Wrist Radial Deviation Limitations  R UE assist - cues to avoid painful arc    Wrist Ulnar Deviation  Left;10 reps;AAROM    Wrist Ulnar Deviation Limitations  R UE assistance - cues to avoid painful arc    Other wrist exercises  L wrist AAROM flexion/extension with wand seated at table 3" x 10 reps    Other wrist exercises  --      Cryotherapy   Number Minutes Cryotherapy  10 Minutes    Cryotherapy Location  Other (comment)   L elbow   Type of Cryotherapy  Ice pack      Manual Therapy   Manual Therapy  Soft tissue mobilization;Passive ROM    Manual therapy comments  supine    Soft tissue mobilization  STM to L brachioradialis and wrist extensors    Myofascial Release  TPR to L wrist extensors and brachioradialis    Passive ROM  L wrsit flexion/extension and ulnar/radial deviation to tolerance               PT Short Term Goals - 12/16/18 1507      PT SHORT TERM GOAL #1   Title  Independent w/ inital HEP     Time  3    Period  Weeks    Status  Achieved    Target Date  12/31/18        PT Long Term Goals - 12/12/18 1215      PT LONG TERM GOAL #1   Title  Independent w/ advanced HEP.    Time  6    Period  Weeks    Status  On-going      PT LONG TERM GOAL #2   Title  Pt. will demonstrate an  increase in Lt elbow and wrist AROM to WNL to increase function.    Time  6    Period  Weeks    Status  On-going      PT LONG TERM GOAL #3   Title  Pt. will demonstrate increase in L elbow/wrist strength to 5/5 and Lt grip strength to >40 lbs to allow for increased elbow stability and functional use of arm.    Time  6    Period  Weeks    Status  On-going      PT LONG TERM GOAL #4   Title  Patient to report 90% improvement in L elbow pain.    Time  6    Period  Weeks    Status  On-going      PT LONG TERM GOAL #5   Title  Pt will be able to perform her ususal ADL's and work duties with less than 2/10 overall pain.    Time  6    Period  Weeks    Status  On-going            Plan - 01/02/19 1408    Clinical Impression Statement  Pt. reporting no improvement in high subjective L elbow pain levels (7/10 average) with sensation of "burning" at L elbow.  Did have slight decrease in pain at L elbow with PROM/AAROM wrist and AROM elbow activities per protocol today.  Pt. continued with apprehension regarding her post-surgery recover status however this improved dramatically with discussion with therapist regarding much improved L wrist PROM measurements taken today.  Ended visit with MT to address ongoing increased L extensor bundle tension/TPs which was tolerated well with good response.    Personal Factors and Comorbidities  Comorbidity 3+;Fitness;Past/Current Experience;Profession;Time since onset of injury/illness/exacerbation    Comorbidities  kidney stone, depression, bipolar, anxiety, anemia    Rehab  Potential  Good    PT Treatment/Interventions  ADLs/Self Care Home Management;Cryotherapy;Electrical Stimulation;Moist Heat;Therapeutic exercise;Therapeutic activities;Ultrasound;Neuromuscular re-education;Patient/family education;Manual techniques;Vasopneumatic Device;Taping;Splinting;Energy conservation;Dry needling;Passive range of motion;Scar mobilization    PT Next Visit Plan   progress per protocol    Consulted and Agree with Plan of Care  Patient       Patient will benefit from skilled therapeutic intervention in order to improve the following deficits and impairments:  Hypomobility, Increased edema, Decreased scar mobility, Decreased activity tolerance, Increased fascial restricitons, Pain, Impaired UE functional use, Improper body mechanics, Decreased range of motion, Impaired flexibility, Postural dysfunction  Visit Diagnosis: Pain in left elbow  Stiffness of left elbow, not elsewhere classified  Stiffness of left wrist, not elsewhere classified  Muscle weakness (generalized)     Problem List Patient Active Problem List   Diagnosis Date Noted  . Right carpal tunnel syndrome 12/09/2018  . Closed nondisplaced fracture of fifth left metatarsal bone 05/10/2018  . History of shingles 01/08/2018  . Menopause 01/08/2018  . Left ankle injury, subsequent encounter 08/04/2017  . PCP NOTES >>>>>>>>>> 07/11/2017  . 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 Chronic knee pain 04/18/2015  . Plantar fasciitis 04/18/2015  . Tobacco use disorder 03/15/2015  . Morbid obesity ([place]) 01/05/2015    Bess Harvest, PTA 01/02/19 4:57 PM   [facility] [address]  [place], [place], [address] Phone: [phone]   Fax:  [phone]  Name: JOLLEEN SEMAN MRN: [id number] Date of Birth: [birth year]

## 2019-01-06 ENCOUNTER — Encounter: Payer: Self-pay | Admitting: Physical Therapy

## 2019-01-06 ENCOUNTER — Ambulatory Visit: Payer: PRIVATE HEALTH INSURANCE | Admitting: Physical Therapy

## 2019-01-06 ENCOUNTER — Telehealth: Payer: Self-pay | Admitting: Neurology

## 2019-01-06 ENCOUNTER — Other Ambulatory Visit: Payer: Self-pay

## 2019-01-06 DIAGNOSIS — M25522 Pain in left elbow: Secondary | ICD-10-CM

## 2019-01-06 DIAGNOSIS — M25632 Stiffness of left wrist, not elsewhere classified: Secondary | ICD-10-CM

## 2019-01-06 DIAGNOSIS — M25622 Stiffness of left elbow, not elsewhere classified: Secondary | ICD-10-CM

## 2019-01-06 DIAGNOSIS — G5600 Carpal tunnel syndrome, unspecified upper limb: Secondary | ICD-10-CM

## 2019-01-06 DIAGNOSIS — M6281 Muscle weakness (generalized): Secondary | ICD-10-CM

## 2019-01-06 NOTE — Telephone Encounter (Signed)
I called the patient.  The patient wishes to have a referral to another surgeon for her carpal tunnel syndrome.  I will get this set up.  I will make a referral to Dr. Fredna Dow.

## 2019-01-06 NOTE — Addendum Note (Signed)
Addended by: Kathrynn Ducking on: 01/06/2019 05:35 PM   Modules accepted: Orders

## 2019-01-06 NOTE — Therapy (Signed)
[facility] [address]  [place], [place], [address] Phone: [phone]   Fax:  [phone]  Physical Therapy Progress Note  Patient Details  Name: Ashley Franklin MRN: [id number] Date of Birth: 07/13/1970 Referring Provider (PT): Milly Jakob, MD   Encounter Date: 01/06/2019  PT End of Session - 01/06/19 1605    Visit Number  9    Number of Visits  21    Date for PT Re-Evaluation  02/17/19    Authorization Type  WC: 1 eval + 8 tx    Authorization - Visit Number  9    Authorization - Number of Visits  9    PT Start Time  1406    PT Stop Time  1444    PT Time Calculation (min)  38 min    Equipment Utilized During Treatment  Other (comment)   L wrist brace   Activity Tolerance  Patient tolerated treatment well    Behavior During Therapy  [facility] for tasks assessed/performed       Past Medical History:  Diagnosis Date  . Anemia   . Anxiety   . Bipolar affective disorder ([place])   . Blood transfusion without reported diagnosis   . Bronchospasm with bronchitis, acute   . Depression   . Kidney stone   . Right carpal tunnel syndrome 12/09/2018  . SBO (small bowel obstruction) ([place]) 01/2014    Past Surgical History:  Procedure Laterality Date  . CESAREAN SECTION    . ENDOMETRIAL ABLATION    . GASTRIC BYPASS  2001  . SBO  2015    There were no vitals filed for this visit.  Subjective Assessment - 01/06/19 1408    Subjective  Reports that things are going okay and is feeling like she is getting a slight improvement. Still having a constant sting in the lateral epicondyle. Reports 30% improvement in elbow. Would like to continue working towards being able to tolerate normal ADLs without pain and strengthening.    Pertinent History  kidney stone, depression, bipolar, anxiety, anemia    Patient Stated Goals  return to work    Currently in Pain?  Yes    Pain Score  5     Pain Location  Elbow    Pain Orientation   Left    Pain Descriptors / Indicators  Burning    Pain Type  Surgical pain;Acute pain         [facility] PT Assessment - 01/06/19 0001      Assessment   Medical Diagnosis  s/p L lateral epicondylitis debridement    Referring Provider (PT)  Milly Jakob, MD    Onset Date/Surgical Date  11/29/18      AROM   Left Elbow Flexion  130    Left Elbow Extension  10    Left Wrist Extension  40 Degrees    Left Wrist Flexion  70 Degrees    Left Wrist Radial Deviation  18 Degrees    Left Wrist Ulnar Deviation  22 Degrees      PROM   Right/Left Wrist  Left   carried over from 08/20   Left Wrist Extension  83 Degrees    Left Wrist Flexion  75 Degrees    Left Wrist Radial Deviation  18 Degrees    Left Wrist Ulnar Deviation  32 Degrees      Strength   Right/Left Elbow  Left    Left Elbow Flexion  4/5  Left Elbow Extension  3+/5    Right/Left Forearm  Left    Left Forearm Pronation  4/5    Left Forearm Supination  4/5    Right/Left Wrist  Left    Left Wrist Flexion  4-/5    Left Wrist Extension  4-/5    Left Wrist Radial Deviation  4/5    Left Wrist Ulnar Deviation  4/5    Right/Left hand  Left    Left Hand Gross Grasp  Impaired    Left Hand Grip (lbs)  0                   [facility] Adult PT Treatment/Exercise - 01/06/19 0001      Shoulder Exercises: Stretch   Other Shoulder Stretches  L wrist flexion/extension stretch with straight elbow 20" each to tolerance      Wrist Exercises   Wrist Flexion  AROM;Left;10 reps;Seated    Wrist Flexion Limitations  against gravity with elbow supported; to tolerance    Wrist Extension  Left;Seated;10 reps;AROM    Wrist Extension Limitations  against gravity with elbow supported; to tolerance             PT Education - 01/06/19 1604    Education Details  discussion on objective progress with PT, normal progression of rehab, update to HEP-removed wrist AAROM and replaced with AROM    Person(s) Educated  Patient    Methods   Explanation;Demonstration;Tactile cues;Verbal cues;Handout    Comprehension  Verbalized understanding;Returned demonstration       PT Short Term Goals - 01/06/19 1410      PT SHORT TERM GOAL #1   Title  Independent w/ inital HEP     Time  3    Period  Weeks    Status  Achieved    Target Date  12/31/18        PT Long Term Goals - 01/06/19 1410      PT LONG TERM GOAL #1   Title  Independent w/ advanced HEP.    Time  6    Period  Weeks    Status  Partially Met   met for current   Target Date  02/17/19      PT LONG TERM GOAL #2   Title  Pt. will demonstrate an increase in Lt elbow and wrist AROM to WNL to increase function.    Time  6    Period  Weeks    Status  Partially Met   AROM improved in L elbow flexion and now able to tolerate wrist AROM in all planes; most limited in wrist extension   Target Date  02/17/19      PT LONG TERM GOAL #3   Title  Pt. will demonstrate increase in L elbow/wrist strength to 5/5 and Lt grip strength to >40 lbs to allow for increased elbow stability and functional use of arm.    Time  6    Period  Weeks    Status  On-going   able to tolerate strength testing this date. Scored 0lbs on grip strength   Target Date  02/17/19      PT LONG TERM GOAL #4   Title  Patient to report 90% improvement in L elbow pain.    Time  6    Period  Weeks    Status  On-going   reports 25% improvement   Target Date  02/17/19      PT LONG TERM GOAL #5  Title  Pt will be able to perform her ususal ADL's and work duties with less than 2/10 overall pain.    Time  6    Period  Weeks    Status  On-going   reports that she pushes herself to 8/10 pain when trying to perform ADLs   Target Date  02/17/19            Plan - 01/06/19 1613    Clinical Impression Statement  Patient arrived to session with report of slight improvement in L elbow since last session. Overall, reports 30% improvement since starting PT. Notes that she would like to continue working  towards being able to complete ADLs without pain. Still having pain with simple movements such as opening her car door. AROM has improved in L elbow flexion. Patient is also now able to tolerate wrist AROM in all planes; most limited in wrist extension. PROM is improved in flexion, extension, and ulnar deviation. Patient was able to tolerate strength testing this date, however still showing weakness, as expected. She reports 25% improvement in pain levels since surgery. Notes that she pushes herself to 8/10 pain when trying to perform ADLs. Patient tolerated addition of wrist AROM with mild discomfort and fatigue after several reps. Updated HEP with AROM as patient struggling with AAROM. Ended session with no complaints. Patient is showing slow but steady progress towards goals. Would benefit from additional skilled PT services 2x/week for 6 weeks to return to work.    Personal Factors and Comorbidities  Comorbidity 3+;Fitness;Past/Current Experience;Profession;Time since onset of injury/illness/exacerbation    Comorbidities  kidney stone, depression, bipolar, anxiety, anemia    Rehab Potential  Good    PT Frequency  2x / week    PT Duration  6 weeks    PT Treatment/Interventions  ADLs/Self Care Home Management;Cryotherapy;Electrical Stimulation;Moist Heat;Therapeutic exercise;Therapeutic activities;Ultrasound;Neuromuscular re-education;Patient/family education;Manual techniques;Vasopneumatic Device;Taping;Splinting;Energy conservation;Dry needling;Passive range of motion;Scar mobilization    PT Next Visit Plan  progress per protocol    Consulted and Agree with Plan of Care  Patient       Patient will benefit from skilled therapeutic intervention in order to improve the following deficits and impairments:  Hypomobility, Increased edema, Decreased scar mobility, Decreased activity tolerance, Increased fascial restricitons, Pain, Impaired UE functional use, Improper body mechanics, Decreased range of  motion, Impaired flexibility, Postural dysfunction  Visit Diagnosis: Pain in left elbow  Stiffness of left elbow, not elsewhere classified  Stiffness of left wrist, not elsewhere classified  Muscle weakness (generalized)     Problem List Patient Active Problem List   Diagnosis Date Noted  . Right carpal tunnel syndrome 12/09/2018  . Closed nondisplaced fracture of fifth left metatarsal bone 05/10/2018  . History of shingles 01/08/2018  . Menopause 01/08/2018  . Left ankle injury, subsequent encounter 08/04/2017  . PCP NOTES >>>>>>>>>> 07/11/2017  . 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 Chronic knee pain 04/18/2015  . Plantar fasciitis 04/18/2015  . Tobacco use disorder 03/15/2015  . Morbid obesity ([place]) 01/05/2015    Janene Harvey, PT, DPT 01/06/19 4:16 PM   [facility] [address]  [place], [place], [address] Phone: [phone]   Fax:  [phone]  Name: KORRYN PANCOAST MRN: [id number] Date of Birth: March 17, 1971

## 2019-01-06 NOTE — Telephone Encounter (Signed)
I contacted the Ashley Franklin. Ashley Franklin reports she has talked with her orthopedist and would prefer for him not to complete her carpal tunnel surgery.  Ashley Franklin is requesting for Dr. Jannifer Franklin to recommend a office/ provider for this procedure to be completed.

## 2019-01-06 NOTE — Telephone Encounter (Signed)
Pt is calling in stating she needs a referral to nuerosurgery

## 2019-01-08 NOTE — Progress Notes (Signed)
Virtual Visit via Video Note  I connected with Ashley Franklin on 01/21/19 at  3:30 PM EDT by a video enabled telemedicine application and verified that I am speaking with the correct person using two identifiers.   I discussed the limitations of evaluation and management by telemedicine and the availability of in person appointments. The patient expressed understanding and agreed to proceed.   I discussed the assessment and treatment plan with the patient. The patient was provided an opportunity to ask questions and all were answered. The patient agreed with the plan and demonstrated an understanding of the instructions.   The patient was advised to call back or seek an in-person evaluation if the symptoms worsen or if the condition fails to improve as anticipated.  I provided 25 minutes of non-face-to-face time during this encounter.   Ashley Hottereina Rogue Rafalski, MD    [facility] MD/PA/NP OP Progress Note  01/21/2019 4:07 PM Ashley OatsJennifer S Franklin  MRN:  [id number]  Chief Complaint:  Chief Complaint    Follow-up; Other     HPI:  This is a follow-up appointment for bipolar disorder.  She states that she has concern.  She falls asleep while she talks on the phone, driving, eating. She also reports that she falls asleep while she was attending a party for 80six year old child. Her husband tries to wake her up, and no known seizure like episode. She also notices that she has generalized weakness and feels unbalanced.  She occasionally has word finding difficulty.  She has not noticed any worsening in memory issues.  She has not talked with her PCP as she wanted to discuss with this examiner first.  She has been taking medication regularly, and she denies any change in her medication.  She takes diazepam a few times per week; she is amenable to discontinue this medication.  She states that her mood is good otherwise, stating that her workload has been less since carpel tunnel surgery, She sleeps well. She denies feeling  depressed. She has good appetite. She has difficulty in concentration.  She denies SI.  She denies decreased need for sleep or euphoria. She had sleep study in April, and uses CPAP machine.   Visit Diagnosis:    ICD-10-CM   1. Bipolar affective disorder, currently depressed, mild ([facility])  F31.31 Valproic Acid level    Comprehensive Metabolic Panel (CMET)    CBC    Past Psychiatric History: Please see initial evaluation for full details. I have reviewed the history. No updates at this time.     Past Medical History:  Past Medical History:  Diagnosis Date  . Anemia   . Anxiety   . Bipolar affective disorder ([facility])   . Blood transfusion without reported diagnosis   . Bronchospasm with bronchitis, acute   . Depression   . Kidney stone   . Right carpal tunnel syndrome 12/09/2018  . SBO (small bowel obstruction) ([facility]) 01/2014    Past Surgical History:  Procedure Laterality Date  . CESAREAN SECTION    . ENDOMETRIAL ABLATION    . GASTRIC BYPASS  2001  . SBO  2015    Family Psychiatric History: Please see initial evaluation for full details. I have reviewed the history. No updates at this time.     Family History:  Family History  Problem Relation Age of Onset  . Diabetes Mother   . Cancer Mother 60       Throat   . Bipolar disorder Mother   . Bipolar disorder  Sister   . Bipolar disorder Brother   . Drug abuse Brother   . Bipolar disorder Sister   . Colon cancer Neg Hx   . Cancer - Prostate Neg Hx   . Breast cancer Neg Hx     Social History:  Social History   Socioeconomic History  . Marital status: Married    Spouse name: Not on file  . Number of children: 2  . Years of education: Not on file  . Highest education level: Not on file  Occupational History  . Occupation: phelbotomist    Employer: [employer]  Social Needs  . Financial resource strain: Not on file  . Food insecurity    Worry: Not on file    Inability: Not on file  . Transportation needs     Medical: Not on file    Non-medical: Not on file  Tobacco Use  . Smoking status: Current Every Day Smoker    Packs/day: 0.25    Years: 6.00    Pack years: 1.50  . Smokeless tobacco: Never Used  . Tobacco comment: 1/2 ppd   Substance and Sexual Activity  . Alcohol use: Yes    Alcohol/week: 0.0 standard drinks    Comment: Two times a week.   . Drug use: No  . Sexual activity: Yes    Partners: Male    Birth control/protection: Surgical  Lifestyle  . Physical activity    Days per week: Not on file    Minutes per session: Not on file  . Stress: Not on file  Relationships  . Social Musicianconnections    Talks on phone: Not on file    Gets together: Not on file    Attends religious service: Not on file    Active member of club or organization: Not on file    Attends meetings of clubs or organizations: Not on file    Relationship status: Not on file  Other Topics Concern  . Not on file  Social History Narrative   The patient is a Water quality scientistphlebotomist at [employer]    1 son born 1988-08-13 daughter born in [id number] she is married and lives with her husband   Prior smoker not current 3 caffeinated beverages a day no alcohol tobacco or drug use      2 independent teachers    Pharmacy schools, music teacher   Right handed    Caffeine 2 cups daily     Allergies:  Allergies  Allergen Reactions  . Nsaids Other (See Comments)    Contraindicated with gastric bypass    Metabolic Disorder Labs: Lab Results  Component Value Date   HGBA1C 4.9 02/06/2017   MPG 105 02/26/2015   No results found for: PROLACTIN Lab Results  Component Value Date   CHOL 165 02/06/2017   TRIG 87 02/06/2017   HDL 54 02/06/2017   LDLCALC 94 02/06/2017   Lab Results  Component Value Date   TSH 1.12 07/10/2017   TSH 1.22 06/08/2016    Therapeutic Level Labs: No results found for: LITHIUM Lab Results  Component Value Date   VALPROATE 64 10/02/2018   VALPROATE 107 (H) 01/11/2018   No components found for:   CBMZ  Current Medications: Current Outpatient Medications  Medication Sig Dispense Refill  . diazepam (VALIUM) 10 MG tablet 10 mg once a day, and 15 mg twice a day as needed for anxiety 120 tablet 0  . diclofenac sodium (VOLTAREN) 1 % GEL Apply 4 g topically 4 (four) times  daily. (Patient not taking: Reported on 12/10/2018) 100 g 1  . divalproex (DEPAKOTE ER) 500 MG 24 hr tablet Take 4 tablets (2,000 mg total) by mouth at bedtime. 360 tablet 0  . FLUoxetine (PROZAC) 20 MG tablet Take 4 tablets (80 mg total) by mouth daily. 360 tablet 0  . gabapentin (NEURONTIN) 300 MG capsule TAKE 1 CAPSULE (300 MG TOTAL) BY MOUTH AT BEDTIME. 30 capsule 3  . lamoTRIgine (LAMICTAL) 25 MG tablet Take 2 tablets (50 mg total) by mouth daily. 180 tablet 0  . Lurasidone HCl 60 MG TABS Take 1 tablet (60 mg total) by mouth daily. 90 tablet 0  . nystatin cream (MYCOSTATIN) Apply to affected area 2 times daily (Patient not taking: Reported on 12/10/2018) 30 g 1  . varenicline (CHANTIX CONTINUING MONTH PAK) 1 MG tablet Take 1 tablet (1 mg total) by mouth 2 (two) times daily. (Patient not taking: Reported on 12/10/2018) 60 tablet 2  . zolpidem (AMBIEN) 5 MG tablet Take 1 tablet (5 mg total) by mouth at bedtime. 30 tablet 0   No current facility-administered medications for this visit.      Musculoskeletal: Strength & Muscle Tone: N/A Gait & Station: N/A Patient leans: N/A  Psychiatric Specialty Exam: Review of Systems  Psychiatric/Behavioral: Positive for memory loss. Negative for depression, hallucinations, substance abuse and suicidal ideas. The patient is not nervous/anxious and does not have insomnia.   All other systems reviewed and are negative.   There were no vitals taken for this visit.There is no height or weight on file to calculate BMI.  General Appearance: Fairly Groomed  Eye Contact:  Good  Speech:  Clear and Coherent  Volume:  Normal  Mood:  "good"  Affect:  Appropriate, Congruent and calmer   Thought Process:  Coherent  Orientation:  Full (Time, Place, and Person)  Thought Content: Logical   Suicidal Thoughts:  No  Homicidal Thoughts:  No  Memory:  Immediate;   Good  Judgement:  Good  Insight:  Fair  Psychomotor Activity:  Normal  Concentration:  Concentration: Good and Attention Span: Good  Recall:  Good  Fund of Knowledge: Good  Language: Good  Akathisia:  No  Handed:  Right  AIMS (if indicated): not done  Assets:  Communication Skills Desire for Improvement  ADL's:  Intact  Cognition: WNL  Sleep:  Fair   Screenings: PHQ2-9     Nutrition [facility]06/2017 [facility] Visit from 06/08/2016 in [facility] Office Visit from 03/15/2015 in [facility] Visit from 01/12/2015 in [facility]  PHQ-2 Total Score  0  0  0  0  PHQ-9 Total Score  -  2  -  -       Assessment and Plan:  Ashley Franklin is a 48 y.o. year old female with a history of bipolar disorder, anxiety, anemia, obesity s/p gastric bypass surgery , who presents for follow up appointment for Bipolar affective disorder, currently depressed, mild ([place]) - Plan: Valproic Acid level, Comprehensive Metabolic Panel (CMET), CBC  # r/o narcolepsy # generalized weakness  Patient complains of episodes drowsiness and weakness during the day for the past month.  Although it is atypical presentation given she has been on the same regimen at least for several months, will obtain labs to rule out any medication side effect attributing to her symptoms. She is advised to contact her sleep clinic and  her PCP for further evaluation.    # Bipolar disorder, most recent episode depressed # Unspecified anxiety disorder She denies significant mood symptoms since her last visit, which coincided with less intense work schedule after surgery of carpal tunnel syndrome.  Will continue Depakote  to target mood dysregulation.  Will continue lamotrigine to target bipolar disorder.  Discussed risk of Stevens-Johnson syndrome especially with concomitant use of Depakote.  Will continue fluoxetine for depression and anxiety.  Will continue Latuda for mood dysregulation.  Discussed potential metabolic side effect.  Will reduce the dose of diazepam to avoid any drowsiness/weakness.  Will decrease Ambien to avoid potential risk of drowsiness.   Plan: I have reviewed and updated plans as below 1.ContinueDepakote 2000 mg at night  3. Continue fluoxetine 80 mg daily  4.Continuelamotrigine50mg  daily 5.Continuelatuda 60 mg daily 6. Decrease Valium 5 mg twice a day as needed for anxiety  7. DecreaseAmbien2.5 mg at night as needed for sleep 8. Next appointment:9/8 at 3:30 for 30 mins, video 9. Obtain blood test (Depakote, CMP, CBC) - She is on gabapentin 300 mg at night - she sees a therapistweekly  Past trials of medication: Lexapro, Paxil, amitriptyline, Abilify (irritable), oxcarbazepine, perphenazine, Trifluoperazine, Klonopin, Valium. Vistaril,  I have reviewed suicide assessment in detail. No change in the following assessment.   The patient demonstrates the following risk factors for suicide: Chronic risk factorsfor suicide include psychiatric disorder /bipolar disorder,previous self-harm of scratching herself. Acute risk factorsfor suicide includenone. Protective factorsfor this patient include positive social support, positive therapeutic relationship,hope for the future. Considering these factors, the overall suicide risk at this point appears to be low. Patient does have gun access at home, which she declined to lock. Discussed in detail safety plan that anytime having active suicidal thoughts or homicidal thoughts and she need to call 911 or go to local emergency room.  The duration of this appointment visit was 25 minutes of face-to-face time with the patient.   Greater than 50% of this time was spent in counseling, explanation of  diagnosis, planning of further management, and coordination of care.  Ashley Hotter, MD 01/21/2019, 4:07 PM

## 2019-01-09 MED FILL — oxyCODONE HCL 5 MG TABS: 5 | 5 days supply | Qty: 20 | Fill #0

## 2019-01-09 NOTE — Telephone Encounter (Signed)
Noted referral has been sent to Dr. Fredna Dow

## 2019-01-10 ENCOUNTER — Encounter: Payer: Self-pay | Admitting: Internal Medicine

## 2019-01-13 ENCOUNTER — Encounter: Payer: No Typology Code available for payment source | Admitting: Physical Therapy

## 2019-01-13 MED FILL — CELECOXIB 200 MG CAP: 200 | 30 days supply | Qty: 30 | Fill #0

## 2019-01-14 ENCOUNTER — Ambulatory Visit: Payer: No Typology Code available for payment source | Admitting: Psychology

## 2019-01-15 ENCOUNTER — Other Ambulatory Visit: Payer: Self-pay

## 2019-01-15 ENCOUNTER — Ambulatory Visit: Payer: PRIVATE HEALTH INSURANCE | Attending: Orthopedic Surgery | Admitting: Physical Therapy

## 2019-01-15 ENCOUNTER — Encounter: Payer: Self-pay | Admitting: Physical Therapy

## 2019-01-15 DIAGNOSIS — M25522 Pain in left elbow: Secondary | ICD-10-CM | POA: Insufficient documentation

## 2019-01-15 DIAGNOSIS — M25622 Stiffness of left elbow, not elsewhere classified: Secondary | ICD-10-CM | POA: Insufficient documentation

## 2019-01-15 DIAGNOSIS — M6281 Muscle weakness (generalized): Secondary | ICD-10-CM | POA: Diagnosis present

## 2019-01-15 DIAGNOSIS — M25632 Stiffness of left wrist, not elsewhere classified: Secondary | ICD-10-CM | POA: Insufficient documentation

## 2019-01-15 MED FILL — DIVALPROEX SOD ER 500 MG TA: 500 | 90 days supply | Qty: 360 | Fill #0

## 2019-01-15 NOTE — Therapy (Signed)
[facility] [address]  [place], [place], [address] Phone: [phone]   Fax:  [phone]  Physical Therapy Treatment  Patient Details  Name: Ashley Franklin MRN: [id number] Date of Birth: July 11, 1970 Referring Provider (PT): Milly Jakob, MD   Encounter Date: 01/15/2019  PT End of Session - 01/15/19 1739    Visit Number  10    Number of Visits  21    Date for PT Re-Evaluation  02/17/19    Authorization Type  WC: 1 eval + 8 tx + 8 tx    Authorization - Visit Number  10    Authorization - Number of Visits  17    PT Start Time  7588    PT Stop Time  1734    PT Time Calculation (min)  51 min    Equipment Utilized During Treatment  Other (comment)   L wrist brace   Activity Tolerance  Patient tolerated treatment well    Behavior During Therapy  WFL for tasks assessed/performed       Past Medical History:  Diagnosis Date  . Anemia   . Anxiety   . Bipolar affective disorder ([place])   . Blood transfusion without reported diagnosis   . Bronchospasm with bronchitis, acute   . Depression   . Kidney stone   . Right carpal tunnel syndrome 12/09/2018  . SBO (small bowel obstruction) ([place]) 01/2014    Past Surgical History:  Procedure Laterality Date  . CESAREAN SECTION    . ENDOMETRIAL ABLATION    . GASTRIC BYPASS  2001  . SBO  2015    There were no vitals filed for this visit.  Subjective Assessment - 01/15/19 1643    Subjective  Had her MD F/U recenty- on light duty for next 4 weeks. Doesn't have to wear brace anymore after Friday. MD reassured her about all of her concens and advised her to continue working with PT.    Pertinent History  kidney stone, depression, bipolar, anxiety, anemia    Patient Stated Goals  return to work    Currently in Pain?  Yes    Pain Score  4     Pain Location  Elbow    Pain Orientation  Left    Pain Descriptors / Indicators  Burning    Pain Type  Acute pain;Surgical pain                       [facility] Adult PT Treatment/Exercise - 01/15/19 0001      Elbow Exercises   Elbow Flexion  Strengthening;Left;10 reps;Seated    Bar Weights/Barbell (Elbow Flexion)  2 lbs    Elbow Flexion Limitations  2x10; elbow supinated   mild shaking at end range extension   Forearm Pronation  Strengthening;Left;10 reps;Seated    Bar Weights/Barbell (Forearm Pronation)  1 lb    Forearm Pronation Limitations  2x10; elbow at 90 degrees      Shoulder Exercises: Stretch   Other Shoulder Stretches  L wrist flexion/extension stretch with straight elbow 20" each to tolerance      Wrist Exercises   Wrist Flexion  AROM;Left;10 reps;Seated;Strengthening    Wrist Flexion Limitations  1st set 0#, 2nd set 1#; with elbow supported    Wrist Extension  Left;Seated;10 reps;AROM;Strengthening    Wrist Extension Limitations  1st set 0#, 2nd set 1#; with elbow supported    Wrist Radial Deviation  Left;10 reps;AROM;Strengthening;Seated    Bar  Weights/Barbell (Radial Deviation)  1 lb    Wrist Radial Deviation Limitations  with elbow supported    Wrist Ulnar Deviation  Left;10 reps;Strengthening;Seated    Theraband Level (Ulnar Deviation)  Level 1 (Yellow)    Bar Weights/Barbell (Ulnar Deviation)  --    Wrist Ulnar Deviation Limitations  sitting with L foot securing TB      Manual Therapy   Soft tissue mobilization  STM to L brachioradialis, wrist extensors, wrist flexors- palpable trigger points evident throughout    Myofascial Release  manual TPR to L wrist flexors- tenderness reported             PT Education - 01/15/19 1738    Education Details  update to HEP; discussion on avoiding excessive reps of exercises to avoid overuse injury    Person(s) Educated  Patient    Methods  Explanation;Demonstration;Tactile cues;Verbal cues;Handout    Comprehension  Returned demonstration;Verbalized understanding       PT Short Term Goals - 01/06/19 1410      PT SHORT TERM GOAL #1    Title  Independent w/ inital HEP     Time  3    Period  Weeks    Status  Achieved    Target Date  12/31/18        PT Long Term Goals - 01/06/19 1410      PT LONG TERM GOAL #1   Title  Independent w/ advanced HEP.    Time  6    Period  Weeks    Status  Partially Met   met for current   Target Date  02/17/19      PT LONG TERM GOAL #2   Title  Pt. will demonstrate an increase in Lt elbow and wrist AROM to WNL to increase function.    Time  6    Period  Weeks    Status  Partially Met   AROM improved in L elbow flexion and now able to tolerate wrist AROM in all planes; most limited in wrist extension   Target Date  02/17/19      PT LONG TERM GOAL #3   Title  Pt. will demonstrate increase in L elbow/wrist strength to 5/5 and Lt grip strength to >40 lbs to allow for increased elbow stability and functional use of arm.    Time  6    Period  Weeks    Status  On-going   able to tolerate strength testing this date. Scored 0lbs on grip strength   Target Date  02/17/19      PT LONG TERM GOAL #4   Title  Patient to report 90% improvement in L elbow pain.    Time  6    Period  Weeks    Status  On-going   reports 25% improvement   Target Date  02/17/19      PT LONG TERM GOAL #5   Title  Pt will be able to perform her ususal ADL's and work duties with less than 2/10 overall pain.    Time  6    Period  Weeks    Status  On-going   reports that she pushes herself to 8/10 pain when trying to perform ADLs   Target Date  02/17/19            Plan - 01/15/19 1739    Clinical Impression Statement  Patient reporting that she followed up with her surgeon recently and was assured that she was on  the right track with her PT progression. Patient has also tried to avoid performing excessive exercise reps and has tried to ask for help at home and work when necessary to avoid pain. Pain levels were lower today as a result. Increased weighted resistance with elbow and wrist strengthening  with patient tolerating all exercises well. Did having some mild discomfort with resisted wrist flexion. Ended session with STM and manual TPR to brachioradialis, wrist flexors, and wrist extensors- palpable and tender trigger points still evident throughout. Updated HEP with exercises that were well-tolerated today. Patient reported understanding and with no complaints at end of session.    Personal Factors and Comorbidities  Comorbidity 3+;Fitness;Past/Current Experience;Profession;Time since onset of injury/illness/exacerbation    Comorbidities  kidney stone, depression, bipolar, anxiety, anemia    Rehab Potential  Good    PT Frequency  2x / week    PT Duration  6 weeks    PT Treatment/Interventions  ADLs/Self Care Home Management;Cryotherapy;Electrical Stimulation;Moist Heat;Therapeutic exercise;Therapeutic activities;Ultrasound;Neuromuscular re-education;Patient/family education;Manual techniques;Vasopneumatic Device;Taping;Splinting;Energy conservation;Dry needling;Passive range of motion;Scar mobilization    PT Next Visit Plan  progress per protocol    Consulted and Agree with Plan of Care  Patient       Patient will benefit from skilled therapeutic intervention in order to improve the following deficits and impairments:  Hypomobility, Increased edema, Decreased scar mobility, Decreased activity tolerance, Increased fascial restricitons, Pain, Impaired UE functional use, Improper body mechanics, Decreased range of motion, Impaired flexibility, Postural dysfunction  Visit Diagnosis: Pain in left elbow  Stiffness of left elbow, not elsewhere classified  Stiffness of left wrist, not elsewhere classified  Muscle weakness (generalized)     Problem List Patient Active Problem List   Diagnosis Date Noted  . Right carpal tunnel syndrome 12/09/2018  . Closed nondisplaced fracture of fifth left metatarsal bone 05/10/2018  . History of shingles 01/08/2018  . Menopause 01/08/2018  . Left  ankle injury, subsequent encounter 08/04/2017  . PCP NOTES >>>>>>>>>> 07/11/2017  . 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 Chronic knee pain 04/18/2015  . Plantar fasciitis 04/18/2015  . Tobacco use disorder 03/15/2015  . Morbid obesity ([place]) 01/05/2015     Janene Harvey, PT, DPT 01/15/19 5:45 PM   [facility] [address]  [place], [place], [address] Phone: [phone]   Fax:  [phone]  Name: ALBERTINE LAFOY MRN: [id number] Date of Birth: Feb 24, 1971

## 2019-01-16 ENCOUNTER — Encounter: Payer: No Typology Code available for payment source | Admitting: Physical Therapy

## 2019-01-17 DIAGNOSIS — M6281 Muscle weakness (generalized): Secondary | ICD-10-CM | POA: Diagnosis present

## 2019-01-17 DIAGNOSIS — M25622 Stiffness of left elbow, not elsewhere classified: Secondary | ICD-10-CM | POA: Diagnosis present

## 2019-01-17 DIAGNOSIS — M25632 Stiffness of left wrist, not elsewhere classified: Secondary | ICD-10-CM | POA: Diagnosis present

## 2019-01-17 DIAGNOSIS — M25522 Pain in left elbow: Secondary | ICD-10-CM | POA: Diagnosis present

## 2019-01-21 ENCOUNTER — Ambulatory Visit (INDEPENDENT_AMBULATORY_CARE_PROVIDER_SITE_OTHER): Payer: No Typology Code available for payment source | Admitting: Psychiatry

## 2019-01-21 ENCOUNTER — Other Ambulatory Visit: Payer: Self-pay

## 2019-01-21 ENCOUNTER — Encounter (HOSPITAL_COMMUNITY): Payer: Self-pay | Admitting: Psychiatry

## 2019-01-21 DIAGNOSIS — F3131 Bipolar disorder, current episode depressed, mild: Secondary | ICD-10-CM | POA: Diagnosis not present

## 2019-01-21 NOTE — Patient Instructions (Signed)
1.ContinueDepakote 2000 mg at night  3. Continue fluoxetine 80 mg daily  4.Continuelamotrigine50mg  daily 5.Continuelatuda 60 mg daily 6. Decrease Valium 5 mg twice a day as needed for anxiety  7. DecreaseAmbien2.5 mg at night as needed for sleep 8. Next appointment:9/8 at 3:30 for 30 mins, video 9. Obtain blood test (Depakote, CMP, CBC)

## 2019-01-22 ENCOUNTER — Encounter: Payer: Self-pay | Admitting: Adult Health

## 2019-01-22 ENCOUNTER — Ambulatory Visit: Payer: PRIVATE HEALTH INSURANCE

## 2019-01-22 ENCOUNTER — Other Ambulatory Visit: Payer: Self-pay

## 2019-01-22 DIAGNOSIS — M25632 Stiffness of left wrist, not elsewhere classified: Secondary | ICD-10-CM

## 2019-01-22 DIAGNOSIS — M25522 Pain in left elbow: Secondary | ICD-10-CM

## 2019-01-22 DIAGNOSIS — M25622 Stiffness of left elbow, not elsewhere classified: Secondary | ICD-10-CM

## 2019-01-22 DIAGNOSIS — M6281 Muscle weakness (generalized): Secondary | ICD-10-CM

## 2019-01-22 MED FILL — LATUDA 60 MG TABLET: 60 | 90 days supply | Qty: 90 | Fill #0

## 2019-01-22 NOTE — Therapy (Signed)
[facility] [address]  [place], [place], [address] Phone: [phone]   Fax:  [phone]  Physical Therapy Treatment  Patient Details  Name: Ashley Franklin MRN: [id number] Date of Birth: 1970/12/31 Referring Provider (PT): Milly Jakob, MD   Encounter Date: 01/22/2019  PT End of Session - 01/22/19 1404    Visit Number  11    Number of Visits  21    Date for PT Re-Evaluation  02/17/19    Authorization Type  WC: 1 eval + 8 tx + 8 tx    Authorization - Visit Number  11    Authorization - Number of Visits  17    PT Start Time  1400    PT Stop Time  1438    PT Time Calculation (min)  38 min    Equipment Utilized During Treatment  Other (comment)   L wrist brace   Activity Tolerance  Patient tolerated treatment well    Behavior During Therapy  WFL for tasks assessed/performed       Past Medical History:  Diagnosis Date  . Anemia   . Anxiety   . Bipolar affective disorder ([place])   . Blood transfusion without reported diagnosis   . Bronchospasm with bronchitis, acute   . Depression   . Kidney stone   . Right carpal tunnel syndrome 12/09/2018  . SBO (small bowel obstruction) ([place]) 01/2014    Past Surgical History:  Procedure Laterality Date  . CESAREAN SECTION    . ENDOMETRIAL ABLATION    . GASTRIC BYPASS  2001  . SBO  2015    There were no vitals filed for this visit.  Subjective Assessment - 01/22/19 1403    Subjective  Pt. doing well today.    Pertinent History  kidney stone, depression, bipolar, anxiety, anemia    Patient Stated Goals  return to work    Currently in Pain?  No/denies    Pain Score  0-No pain   Pt. noting rise of L elbow pain up to 4/10 with laundry   Pain Location  Elbow    Pain Orientation  Left    Pain Descriptors / Indicators  Burning    Pain Type  Acute pain;Surgical pain    Multiple Pain Sites  No                       [facility] Adult PT Treatment/Exercise  - 01/22/19 0001      Elbow Exercises   Elbow Flexion  Both;15 reps;Strengthening;Seated    Bar Weights/Barbell (Elbow Flexion)  2 lbs    Elbow Extension  Both;10 reps;Theraband;Strengthening    Theraband Level (Elbow Extension)  Level 1 (Yellow)    Elbow Extension Limitations  closed in door    Forearm Supination  Left;10 reps;AROM    Forearm Supination Limitations  with hammer conservative     Forearm Pronation  Left;10 reps;AROM    Forearm Pronation Limitations  with hammer conservative       Shoulder Exercises: Standing   Row  Both;10 reps;Theraband;Strengthening    Theraband Level (Shoulder Row)  Level 1 (Yellow)    Row Limitations  + scapular retraction       Wrist Exercises   Wrist Flexion  AROM;Left;15 reps;Strengthening;Bar weights/barbell    Wrist Flexion Limitations  1#    Wrist Extension  AROM;Left;15 reps;Bar weights/barbell;Seated;Strengthening    Wrist Extension Limitations  1#    Wrist Radial Deviation  Left;AROM;Strengthening;Seated;15 reps;Bar weights/barbell    Bar Weights/Barbell (Radial Deviation)  1 lb    Wrist Ulnar Deviation  Left;15 reps;Strengthening;Bar weights/barbell;Seated    Bar Weights/Barbell (Ulnar Deviation)  1 lb               PT Short Term Goals - 01/06/19 1410      PT SHORT TERM GOAL #1   Title  Independent w/ inital HEP     Time  3    Period  Weeks    Status  Achieved    Target Date  12/31/18        PT Long Term Goals - 01/06/19 1410      PT LONG TERM GOAL #1   Title  Independent w/ advanced HEP.    Time  6    Period  Weeks    Status  Partially Met   met for current   Target Date  02/17/19      PT LONG TERM GOAL #2   Title  Pt. will demonstrate an increase in Lt elbow and wrist AROM to WNL to increase function.    Time  6    Period  Weeks    Status  Partially Met   AROM improved in L elbow flexion and now able to tolerate wrist AROM in all planes; most limited in wrist extension   Target Date  02/17/19      PT  LONG TERM GOAL #3   Title  Pt. will demonstrate increase in L elbow/wrist strength to 5/5 and Lt grip strength to >40 lbs to allow for increased elbow stability and functional use of arm.    Time  6    Period  Weeks    Status  On-going   able to tolerate strength testing this date. Scored 0lbs on grip strength   Target Date  02/17/19      PT LONG TERM GOAL #4   Title  Patient to report 90% improvement in L elbow pain.    Time  6    Period  Weeks    Status  On-going   reports 25% improvement   Target Date  02/17/19      PT LONG TERM GOAL #5   Title  Pt will be able to perform her ususal ADL's and work duties with less than 2/10 overall pain.    Time  6    Period  Weeks    Status  On-going   reports that she pushes herself to 8/10 pain when trying to perform ADLs   Target Date  02/17/19            Plan - 01/22/19 1422    Clinical Impression Statement  Pt. doing well today.  Reports elbow has been feeling much better and she has been attempting to avoid "overdoing it" at home.  Tolerated mild progression of wrist, forearm, elbow strengthening activities per protocol today well without increased pain.  Ended visit pain free thus modalities deferred.  Pt. progressing well per protocol.    Personal Factors and Comorbidities  Comorbidity 3+;Fitness;Past/Current Experience;Profession;Time since onset of injury/illness/exacerbation    Comorbidities  kidney stone, depression, bipolar, anxiety, anemia    Rehab Potential  Good    PT Treatment/Interventions  ADLs/Self Care Home Management;Cryotherapy;Electrical Stimulation;Moist Heat;Therapeutic exercise;Therapeutic activities;Ultrasound;Neuromuscular re-education;Patient/family education;Manual techniques;Vasopneumatic Device;Taping;Splinting;Energy conservation;Dry needling;Passive range of motion;Scar mobilization    PT Next Visit Plan  progress per protocol    Consulted and Agree with Plan of Care  Patient  Patient will benefit  from skilled therapeutic intervention in order to improve the following deficits and impairments:  Hypomobility, Increased edema, Decreased scar mobility, Decreased activity tolerance, Increased fascial restricitons, Pain, Impaired UE functional use, Improper body mechanics, Decreased range of motion, Impaired flexibility, Postural dysfunction  Visit Diagnosis: Pain in left elbow  Stiffness of left elbow, not elsewhere classified  Stiffness of left wrist, not elsewhere classified  Muscle weakness (generalized)     Problem List Patient Active Problem List   Diagnosis Date Noted  . Right carpal tunnel syndrome 12/09/2018  . Closed nondisplaced fracture of fifth left metatarsal bone 05/10/2018  . History of shingles 01/08/2018  . Menopause 01/08/2018  . Left ankle injury, subsequent encounter 08/04/2017  . PCP NOTES >>>>>>>>>> 07/11/2017  . 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 Chronic knee pain 04/18/2015  . Plantar fasciitis 04/18/2015  . Tobacco use disorder 03/15/2015  . Morbid obesity ([place]) 01/05/2015    Bess Harvest, PTA 01/22/19 4:56 PM    [facility] [address]  [place], [place], [address] Phone: [phone]   Fax:  [phone]  Name: Ashley Franklin MRN: [id number] Date of Birth: [birth year]

## 2019-01-24 ENCOUNTER — Ambulatory Visit: Payer: PRIVATE HEALTH INSURANCE

## 2019-01-24 ENCOUNTER — Other Ambulatory Visit: Payer: Self-pay

## 2019-01-24 DIAGNOSIS — M25632 Stiffness of left wrist, not elsewhere classified: Secondary | ICD-10-CM

## 2019-01-24 DIAGNOSIS — M25522 Pain in left elbow: Secondary | ICD-10-CM | POA: Diagnosis not present

## 2019-01-24 DIAGNOSIS — M6281 Muscle weakness (generalized): Secondary | ICD-10-CM

## 2019-01-24 DIAGNOSIS — M25622 Stiffness of left elbow, not elsewhere classified: Secondary | ICD-10-CM

## 2019-01-24 NOTE — Therapy (Signed)
[facility] [address]  [place], [place], [address] Phone: [phone]   Fax:  [phone]  Physical Therapy Treatment  Patient Details  Name: Ashley Franklin MRN: [id number] Date of Birth: 1971-02-27 Referring Provider (PT): Milly Jakob, MD   Encounter Date: 01/24/2019  PT End of Session - 01/24/19 1152    Visit Number  12    Number of Visits  21    Date for PT Re-Evaluation  02/17/19    Authorization Type  WC: 1 eval + 8 tx + 8 tx    Authorization - Visit Number  12    Authorization - Number of Visits  17    PT Start Time  1104    PT Stop Time  1144    PT Time Calculation (min)  40 min    Equipment Utilized During Treatment  Other (comment)   L wrist brace   Activity Tolerance  Patient tolerated treatment well    Behavior During Therapy  WFL for tasks assessed/performed       Past Medical History:  Diagnosis Date  . Anemia   . Anxiety   . Bipolar affective disorder ([place])   . Blood transfusion without reported diagnosis   . Bronchospasm with bronchitis, acute   . Depression   . Kidney stone   . Right carpal tunnel syndrome 12/09/2018  . SBO (small bowel obstruction) ([place]) 01/2014    Past Surgical History:  Procedure Laterality Date  . CESAREAN SECTION    . ENDOMETRIAL ABLATION    . GASTRIC BYPASS  2001  . SBO  2015    There were no vitals filed for this visit.  Subjective Assessment - 01/24/19 1156    Subjective  Pt. doing well today.    Pertinent History  kidney stone, depression, bipolar, anxiety, anemia    Patient Stated Goals  return to work    Currently in Pain?  No/denies    Pain Score  0-No pain    Multiple Pain Sites  No                       [facility] Adult PT Treatment/Exercise - 01/24/19 0001      Elbow Exercises   Forearm Supination  Left;10 reps;AROM    Forearm Supination Limitations  with hammer mid grip    Forearm Pronation  Left;10 reps;AROM    Forearm  Pronation Limitations  with hammer mid grip       Shoulder Exercises: Stretch   Other Shoulder Stretches  L wrist flexion/extension stretch with straight elbow 20" each to tolerance      Hand Exercises   Other Hand Exercises  yellow putty L hand grip 2 x 15 reps     Other Hand Exercises  Stoval L grip squeezer easy yellow spring 10# x 15 reps      Wrist Exercises   Wrist Flexion Limitations  2    Wrist Extension  AROM;Left;15 reps;Bar weights/barbell;Seated;Strengthening    Wrist Extension Limitations  1#    Wrist Radial Deviation  Left;AROM;Strengthening;Seated;Bar weights/barbell;10 reps    Bar Weights/Barbell (Radial Deviation)  2 lbs    Wrist Ulnar Deviation  Left;Strengthening;Bar weights/barbell;Seated;10 reps    Bar Weights/Barbell (Ulnar Deviation)  2 lbs    Other wrist exercises  yellow TB wrist roller flexion/extension with PVC pipe x 2 reps                PT Short Term  Goals - 01/06/19 1410      PT SHORT TERM GOAL #1   Title  Independent w/ inital HEP     Time  3    Period  Weeks    Status  Achieved    Target Date  12/31/18        PT Long Term Goals - 01/06/19 1410      PT LONG TERM GOAL #1   Title  Independent w/ advanced HEP.    Time  6    Period  Weeks    Status  Partially Met   met for current   Target Date  02/17/19      PT LONG TERM GOAL #2   Title  Pt. will demonstrate an increase in Lt elbow and wrist AROM to WNL to increase function.    Time  6    Period  Weeks    Status  Partially Met   AROM improved in L elbow flexion and now able to tolerate wrist AROM in all planes; most limited in wrist extension   Target Date  02/17/19      PT LONG TERM GOAL #3   Title  Pt. will demonstrate increase in L elbow/wrist strength to 5/5 and Lt grip strength to >40 lbs to allow for increased elbow stability and functional use of arm.    Time  6    Period  Weeks    Status  On-going   able to tolerate strength testing this date. Scored 0lbs on grip  strength   Target Date  02/17/19      PT LONG TERM GOAL #4   Title  Patient to report 90% improvement in L elbow pain.    Time  6    Period  Weeks    Status  On-going   reports 25% improvement   Target Date  02/17/19      PT LONG TERM GOAL #5   Title  Pt will be able to perform her ususal ADL's and work duties with less than 2/10 overall pain.    Time  6    Period  Weeks    Status  On-going   reports that she pushes herself to 8/10 pain when trying to perform ADLs   Target Date  02/17/19            Plan - 01/24/19 1153    Clinical Impression Statement  Spring denies soreness from last session.  Able to progress with all wrist/hand/elbow strengthening activities in session today without increased pain only noted fatigue following session.  Pt. does require occasional cueing still for proper pacing and positioning with therex however seems to be improving with carryover.  Pt. declining ice to end session as she reports she will ice at home.  Progressing well per protocol.    Personal Factors and Comorbidities  Comorbidity 3+;Fitness;Past/Current Experience;Profession;Time since onset of injury/illness/exacerbation    Comorbidities  kidney stone, depression, bipolar, anxiety, anemia    Rehab Potential  Good    PT Treatment/Interventions  ADLs/Self Care Home Management;Cryotherapy;Electrical Stimulation;Moist Heat;Therapeutic exercise;Therapeutic activities;Ultrasound;Neuromuscular re-education;Patient/family education;Manual techniques;Vasopneumatic Device;Taping;Splinting;Energy conservation;Dry needling;Passive range of motion;Scar mobilization    PT Next Visit Plan  progress per protocol    Consulted and Agree with Plan of Care  Patient       Patient will benefit from skilled therapeutic intervention in order to improve the following deficits and impairments:  Hypomobility, Increased edema, Decreased scar mobility, Decreased activity tolerance, Increased fascial restricitons,  Pain, Impaired UE  functional use, Improper body mechanics, Decreased range of motion, Impaired flexibility, Postural dysfunction  Visit Diagnosis: Pain in left elbow  Stiffness of left elbow, not elsewhere classified  Stiffness of left wrist, not elsewhere classified  Muscle weakness (generalized)     Problem List Patient Active Problem List   Diagnosis Date Noted  . Right carpal tunnel syndrome 12/09/2018  . Closed nondisplaced fracture of fifth left metatarsal bone 05/10/2018  . History of shingles 01/08/2018  . Menopause 01/08/2018  . Left ankle injury, subsequent encounter 08/04/2017  . PCP NOTES >>>>>>>>>> 07/11/2017  . 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 Chronic knee pain 04/18/2015  . Plantar fasciitis 04/18/2015  . Tobacco use disorder 03/15/2015  . Morbid obesity ([place]) 01/05/2015    Bess Harvest, PTA 01/24/19 12:09 PM  [facility] [address]  [place], [place], [address] Phone: [phone]   Fax:  [phone]  Name: CARLINA DERKS MRN: [id number] Date of Birth: Jan 07, 1971

## 2019-01-27 ENCOUNTER — Encounter: Payer: Self-pay | Admitting: Physical Therapy

## 2019-01-27 ENCOUNTER — Telehealth (HOSPITAL_COMMUNITY): Payer: Self-pay | Admitting: *Deleted

## 2019-01-27 ENCOUNTER — Ambulatory Visit: Payer: PRIVATE HEALTH INSURANCE | Admitting: Physical Therapy

## 2019-01-27 ENCOUNTER — Encounter: Payer: Self-pay | Admitting: Internal Medicine

## 2019-01-27 ENCOUNTER — Other Ambulatory Visit: Payer: Self-pay

## 2019-01-27 ENCOUNTER — Other Ambulatory Visit (HOSPITAL_COMMUNITY): Payer: Self-pay | Admitting: Psychiatry

## 2019-01-27 DIAGNOSIS — M6281 Muscle weakness (generalized): Secondary | ICD-10-CM

## 2019-01-27 DIAGNOSIS — M25622 Stiffness of left elbow, not elsewhere classified: Secondary | ICD-10-CM

## 2019-01-27 DIAGNOSIS — M25522 Pain in left elbow: Secondary | ICD-10-CM

## 2019-01-27 DIAGNOSIS — M25632 Stiffness of left wrist, not elsewhere classified: Secondary | ICD-10-CM

## 2019-01-27 NOTE — Therapy (Signed)
[facility] [address]  [place], [place], [address] Phone: [phone]   Fax:  [phone]  Physical Therapy Treatment  Patient Details  Name: Ashley Franklin MRN: [id number] Date of Birth: July 06, 1970 Referring Provider (PT): Milly Jakob, MD   Encounter Date: 01/27/2019  PT End of Session - 01/27/19 1743    Visit Number  13    Number of Visits  21    Date for PT Re-Evaluation  02/17/19    Authorization Type  WC: 1 eval + 8 tx + 8 tx    Authorization - Visit Number  13    Authorization - Number of Visits  17    PT Start Time  4097    PT Stop Time  1531    PT Time Calculation (min)  46 min    Activity Tolerance  Patient tolerated treatment well;Patient limited by pain    Behavior During Therapy  [facility] for tasks assessed/performed       Past Medical History:  Diagnosis Date  . Anemia   . Anxiety   . Bipolar affective disorder ([place])   . Blood transfusion without reported diagnosis   . Bronchospasm with bronchitis, acute   . Depression   . Kidney stone   . Right carpal tunnel syndrome 12/09/2018  . SBO (small bowel obstruction) ([place]) 01/2014    Past Surgical History:  Procedure Laterality Date  . CESAREAN SECTION    . ENDOMETRIAL ABLATION    . GASTRIC BYPASS  2001  . SBO  2015    There were no vitals filed for this visit.  Subjective Assessment - 01/27/19 1446    Subjective  Has thought about ADLs that she cannot do- having trouble changing sheets and lifting a mattress to tuck a sheet, lifting a 2L from the refrigerator, lifitng a skillet, lifting a bag up onto a counter. 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    Pertinent History  kidney stone, depression, bipolar, anxiety, anemia    Patient Stated Goals  return to work    Currently in Pain?  No/denies                       [facility] Adult PT  Treatment/Exercise - 01/27/19 0001      Elbow Exercises   Elbow Flexion  Both;15 reps;Strengthening;Seated    Bar Weights/Barbell (Elbow Flexion)  2 lbs    Elbow Flexion Limitations  10x each with 3 wrist orientations   pain with pronated wrist     Shoulder Exercises: ROM/Strengthening   UBE (Upper Arm Bike)  L1 3 min forward/3 min backwards   using R UE > L UE     Wrist Exercises   Wrist Flexion  AROM;Left;15 reps;Strengthening;Bar weights/barbell    Wrist Flexion Limitations  2x15 2#; 2nd set with hold at end range    Wrist Extension  AROM;Left;15 reps;Bar weights/barbell;Seated;Strengthening    Wrist Extension Limitations  2x15 1#; 2nd set with hold at end range    Wrist Radial Deviation  Left;AROM;Strengthening;Seated;Bar weights/barbell;10 reps    Wrist Radial Deviation Limitations  mid grip on hammer   pain   Wrist Ulnar Deviation  Left;Strengthening;Bar weights/barbell;Seated;10 reps    Wrist Ulnar Deviation Limitations  mid grip on hammer    Other wrist exercises  L wrist circumduction CW/CCW 10x in pronation, 10x in  supination with green medball    Other wrist exercises  L wrist flexion/extension stretch with extended elbow 20" each to tolerance      Manual Therapy   Manual Therapy  Soft tissue mobilization;Myofascial release    Soft tissue mobilization  STM to L brachioradialis, wrist extensors, lateral wrist flexors- palpable painful trigger points evident throughout    Myofascial Release  manual TPR to L wrist extensors, flexors, brachioradialis- tenderness reported             PT Education - 01/27/19 1742    Education Details  update to HEP; discussion on benefits of DN    Person(s) Educated  Patient    Methods  Explanation;Demonstration;Tactile cues;Verbal cues;Handout    Comprehension  Verbalized understanding;Returned demonstration       PT Short Term Goals - 01/06/19 1410      PT SHORT TERM GOAL #1   Title  Independent w/ inital HEP     Time  3     Period  Weeks    Status  Achieved    Target Date  12/31/18        PT Long Term Goals - 01/06/19 1410      PT LONG TERM GOAL #1   Title  Independent w/ advanced HEP.    Time  6    Period  Weeks    Status  Partially Met   met for current   Target Date  02/17/19      PT LONG TERM GOAL #2   Title  Pt. will demonstrate an increase in Lt elbow and wrist AROM to WNL to increase function.    Time  6    Period  Weeks    Status  Partially Met   AROM improved in L elbow flexion and now able to tolerate wrist AROM in all planes; most limited in wrist extension   Target Date  02/17/19      PT LONG TERM GOAL #3   Title  Pt. will demonstrate increase in L elbow/wrist strength to 5/5 and Lt grip strength to >40 lbs to allow for increased elbow stability and functional use of arm.    Time  6    Period  Weeks    Status  On-going   able to tolerate strength testing this date. Scored 0lbs on grip strength   Target Date  02/17/19      PT LONG TERM GOAL #4   Title  Patient to report 90% improvement in L elbow pain.    Time  6    Period  Weeks    Status  On-going   reports 25% improvement   Target Date  02/17/19      PT LONG TERM GOAL #5   Title  Pt will be able to perform her ususal ADL's and work duties with less than 2/10 overall pain.    Time  6    Period  Weeks    Status  On-going   reports that she pushes herself to 8/10 pain when trying to perform ADLs   Target Date  02/17/19            Plan - 01/27/19 1743    Clinical Impression Statement  Patient arriving to session with a list of ADLs that she is not able to do at home, as well as job requirements that she needs to be able to meet in order to return to work. Tolerated L UE AAROM on UBE- instructed patient on using  R UE to compensate for L UE weakness. Introduced L elbow flexion strengthening in 3 orientations, with c/o pain with pronated elbow. Continued to work on wrist strengthening with light weight with good tolerance.  Introduced wrist circumduction with weighted medball with limited and choppy ROM but with good effort and no pain. Ended session with STM and manual TPR to L brachioradialis, wrist extensors, lateral wrist flexors- all with palpable and painful trigger points throughout. Patient reported benefit from manual therapy. Spoke with patient on DN as she reports it helped her in the past. Patient agreeable to trying this modality again. No complaints at end of session.    Personal Factors and Comorbidities  Comorbidity 3+;Fitness;Past/Current Experience;Profession;Time since onset of injury/illness/exacerbation    Comorbidities  kidney stone, depression, bipolar, anxiety, anemia    Rehab Potential  Good    PT Treatment/Interventions  ADLs/Self Care Home Management;Cryotherapy;Electrical Stimulation;Moist Heat;Therapeutic exercise;Therapeutic activities;Ultrasound;Neuromuscular re-education;Patient/family education;Manual techniques;Vasopneumatic Device;Taping;Splinting;Energy conservation;Dry needling;Passive range of motion;Scar mobilization    PT Next Visit Plan  progress per protocol; DN to L wrist flexors, extensors, brachioradialis    Consulted and Agree with Plan of Care  Patient       Patient will benefit from skilled therapeutic intervention in order to improve the following deficits and impairments:  Hypomobility, Increased edema, Decreased scar mobility, Decreased activity tolerance, Increased fascial restricitons, Pain, Impaired UE functional use, Improper body mechanics, Decreased range of motion, Impaired flexibility, Postural dysfunction  Visit Diagnosis: Pain in left elbow  Stiffness of left elbow, not elsewhere classified  Stiffness of left wrist, not elsewhere classified  Muscle weakness (generalized)     Problem List Patient Active Problem List   Diagnosis Date Noted  . Right carpal tunnel syndrome 12/09/2018  . Closed nondisplaced fracture of fifth left metatarsal bone  05/10/2018  . History of shingles 01/08/2018  . Menopause 01/08/2018  . Left ankle injury, subsequent encounter 08/04/2017  . PCP NOTES >>>>>>>>>> 07/11/2017  . 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 Chronic knee pain 04/18/2015  . Plantar fasciitis 04/18/2015  . Tobacco use disorder 03/15/2015  . Morbid obesity ([place]) 01/05/2015    Janene Harvey, PT, DPT 01/27/19 5:45 PM   [facility] [address]  [place], [place], [address] Phone: [phone]   Fax:  [phone]  Name: Ashley Franklin MRN: [id number] Date of Birth: April 27, 1971

## 2019-01-27 NOTE — Telephone Encounter (Signed)
[facility] COULDN'T RETRIEVE LABS PRINTED & FAXED

## 2019-01-28 ENCOUNTER — Ambulatory Visit: Payer: No Typology Code available for payment source | Admitting: Psychology

## 2019-01-28 ENCOUNTER — Encounter (HOSPITAL_COMMUNITY): Payer: Self-pay | Admitting: Psychiatry

## 2019-01-28 LAB — SPECIMEN STATUS REPORT

## 2019-01-29 ENCOUNTER — Other Ambulatory Visit (HOSPITAL_COMMUNITY): Payer: Self-pay | Admitting: *Deleted

## 2019-01-29 ENCOUNTER — Encounter: Payer: Self-pay | Admitting: Internal Medicine

## 2019-01-29 ENCOUNTER — Ambulatory Visit (INDEPENDENT_AMBULATORY_CARE_PROVIDER_SITE_OTHER): Payer: No Typology Code available for payment source | Admitting: Internal Medicine

## 2019-01-29 ENCOUNTER — Ambulatory Visit: Payer: PRIVATE HEALTH INSURANCE

## 2019-01-29 ENCOUNTER — Other Ambulatory Visit: Payer: Self-pay

## 2019-01-29 VITALS — BP 104/61 | HR 67 | Temp 96.6°F | Resp 18 | Wt 256.0 lb

## 2019-01-29 DIAGNOSIS — R55 Syncope and collapse: Secondary | ICD-10-CM | POA: Diagnosis not present

## 2019-01-29 DIAGNOSIS — M25622 Stiffness of left elbow, not elsewhere classified: Secondary | ICD-10-CM

## 2019-01-29 DIAGNOSIS — F3131 Bipolar disorder, current episode depressed, mild: Secondary | ICD-10-CM

## 2019-01-29 DIAGNOSIS — F319 Bipolar disorder, unspecified: Secondary | ICD-10-CM

## 2019-01-29 DIAGNOSIS — M25632 Stiffness of left wrist, not elsewhere classified: Secondary | ICD-10-CM

## 2019-01-29 DIAGNOSIS — M6281 Muscle weakness (generalized): Secondary | ICD-10-CM

## 2019-01-29 DIAGNOSIS — M25522 Pain in left elbow: Secondary | ICD-10-CM | POA: Diagnosis not present

## 2019-01-29 DIAGNOSIS — F488 Other specified nonpsychotic mental disorders: Secondary | ICD-10-CM

## 2019-01-29 LAB — COMPREHENSIVE METABOLIC PANEL
ALT: 15 IU/L (ref 0–32)
AST: 21 IU/L (ref 0–40)
Albumin/Globulin Ratio: 1.7 (ref 1.2–2.2)
Albumin: 4.3 g/dL (ref 3.8–4.8)
Alkaline Phosphatase: 69 IU/L (ref 39–117)
BUN/Creatinine Ratio: 19 (ref 9–23)
BUN: 14 mg/dL (ref 6–24)
Bilirubin Total: 0.3 mg/dL (ref 0.0–1.2)
CO2: 26 mmol/L (ref 20–29)
Calcium: 9.9 mg/dL (ref 8.7–10.2)
Chloride: 106 mmol/L (ref 96–106)
Creatinine, Ser: 0.74 mg/dL (ref 0.57–1.00)
GFR calc Af Amer: 112 mL/min/{1.73_m2} (ref >59)
GFR calc non Af Amer: 97 mL/min/{1.73_m2} (ref >59)
Globulin, Total: 2.5 g/dL (ref 1.5–4.5)
Glucose: 76 mg/dL (ref 65–99)
Potassium: 4.9 mmol/L (ref 3.5–5.2)
Sodium: 144 mmol/L (ref 134–144)
Total Protein: 6.8 g/dL (ref 6.0–8.5)

## 2019-01-29 LAB — VALPROIC ACID LEVEL: Valproic Acid Lvl: 71 ug/mL (ref 50–100)

## 2019-01-29 LAB — CBC
Hematocrit: 39.2 % (ref 34.0–46.6)
Hemoglobin: 13.1 g/dL (ref 11.1–15.9)
MCH: 31 pg (ref 26.6–33.0)
MCHC: 33.4 g/dL (ref 31.5–35.7)
MCV: 93 fL (ref 79–97)
Platelets: 196 10*3/uL (ref 150–450)
RBC: 4.22 x10E6/uL (ref 3.77–5.28)
RDW: 14.1 % (ref 11.7–15.4)
WBC: 5.4 10*3/uL (ref 3.4–10.8)

## 2019-01-29 NOTE — Therapy (Signed)
[facility] [address]  [place], [place], [address] Phone: [phone]   Fax:  [phone]  Physical Therapy Treatment  Patient Details  Name: Ashley Franklin MRN: [id number] Date of Birth: 02-05-1971 Referring Provider (PT): Milly Jakob, MD   Encounter Date: 01/29/2019  PT End of Session - 01/29/19 1457    Visit Number  14    Number of Visits  21    Date for PT Re-Evaluation  02/17/19    Authorization Type  WC: 1 eval + 8 tx + 8 tx    Authorization - Visit Number  14    Authorization - Number of Visits  17    PT Start Time  7096    PT Stop Time  1545    PT Time Calculation (min)  56 min    Activity Tolerance  Patient tolerated treatment well;Patient limited by pain    Behavior During Therapy  [facility] for tasks assessed/performed       Past Medical History:  Diagnosis Date  . Anemia   . Anxiety   . Bipolar affective disorder ([place])   . Blood transfusion without reported diagnosis   . Bronchospasm with bronchitis, acute   . Depression   . Kidney stone   . Right carpal tunnel syndrome 12/09/2018  . SBO (small bowel obstruction) ([place]) 01/2014    Past Surgical History:  Procedure Laterality Date  . CESAREAN SECTION    . ENDOMETRIAL ABLATION    . GASTRIC BYPASS  2001  . SBO  2015    There were no vitals filed for this visit.  Subjective Assessment - 01/29/19 1452    Subjective  Pt. noting HEP going well wiht exception of some difficulty/pain with wrist flexion exercise she is performing with soup can.    Pertinent History  kidney stone, depression, bipolar, anxiety, anemia    Patient Stated Goals  return to work    Currently in Pain?  No/denies    Pain Score  0-No pain   L elbow pain rising to 3/10 at times with wrist flexion exercises   Pain Location  Elbow    Pain Orientation  Left    Pain Descriptors / Indicators  Burning    Pain Type  Acute pain;Surgical pain    Aggravating Factors   doing  laundry, picking up a 2 liter of coke    Multiple Pain Sites  No         [facility] PT Assessment - 01/29/19 0001      AROM   Left Elbow Flexion  145    Left Elbow Extension  5    Left Wrist Extension  60 Degrees    Left Wrist Flexion  80 Degrees    Left Wrist Radial Deviation  18 Degrees    Left Wrist Ulnar Deviation  32 Degrees      Strength   Right/Left Elbow  Left    Left Elbow Flexion  4/5    Left Elbow Extension  4/5    Right/Left Forearm  Left    Left Forearm Pronation  4+/5    Left Forearm Supination  4+/5    Right/Left Wrist  Left    Left Wrist Flexion  4+/5    Left Wrist Extension  4/5    Left Wrist Radial Deviation  4+/5    Left Wrist Ulnar Deviation  4+/5    Right/Left hand  Left    Right Hand Grip (lbs)  45.6  42#, 45#, 50#    Left Hand Grip (lbs)  8.6   8#, 10#, 8#                  [facility] Adult PT Treatment/Exercise - 01/29/19 0001      Elbow Exercises   Elbow Flexion  10 reps;Left;Strengthening;Seated    Theraband Level (Elbow Flexion)  Level 2 (Red)    Elbow Extension  10 reps;Left;Strengthening;Seated    Forearm Supination  Left;AROM;15 reps    Forearm Supination Limitations  with end grip on hammer     Forearm Pronation  Left;15 reps;AROM    Forearm Pronation Limitations  with end grip on hammer       Shoulder Exercises: ROM/Strengthening   UBE (Upper Arm Bike)  L1.5, 2 min forward/2 min backwards      Hand Exercises   Other Hand Exercises  Stoval L grip squeezer easy yellow spring 20# x 10      Wrist Exercises   Wrist Flexion  AROM;Left;Strengthening;Bar weights/barbell;20 reps    Wrist Flexion Limitations  2# x 20 reps     Wrist Extension Limitations  2# x 10 reps    Wrist Radial Deviation  Left;15 reps;Seated;Theraband    Theraband Level (Radial Deviation)  Level 1 (Yellow)    Wrist Ulnar Deviation  Left;15 reps;Strengthening;Seated;Theraband    Theraband Level (Ulnar Deviation)  Level 1 (Yellow)             PT Education  - 01/29/19 1620    Education Details  red theraputty issued to pt. to progress resistance with grip strengthening, red TB issued to pt. to progress resistance on HEP elbow flexion, extension, ulnar deviation, radial deviation    Person(s) Educated  Patient    Methods  Explanation;Demonstration;Verbal cues;Handout    Comprehension  Verbalized understanding;Returned demonstration;Verbal cues required       PT Short Term Goals - 01/06/19 1410      PT SHORT TERM GOAL #1   Title  Independent w/ inital HEP     Time  3    Period  Weeks    Status  Achieved    Target Date  12/31/18        PT Long Term Goals - 01/29/19 1543      PT LONG TERM GOAL #1   Title  Independent w/ advanced HEP.    Time  6    Period  Weeks    Status  Partially Met      PT LONG TERM GOAL #2   Title  Pt. will demonstrate an increase in Lt elbow and wrist AROM to WNL to increase function.    Time  6    Period  Weeks    Status  Partially Met      PT LONG TERM GOAL #3   Title  Pt. will demonstrate increase in L elbow/wrist strength to 5/5 and Lt grip strength to >40 lbs to allow for increased elbow stability and functional use of arm.    Time  6    Period  Weeks    Status  On-going      PT LONG TERM GOAL #4   Title  Patient to report 90% improvement in L elbow pain.    Time  6    Period  Weeks    Status  Partially Met   01/29/19: reports 75% improvement     PT LONG TERM GOAL #5   Title  Pt will be able to perform her ususal  ADL's and work duties with less than 2/10 overall pain.    Time  6    Period  Weeks    Status  On-going   01/29/19: Pt. noting daily activities pain at worst rising to 4/10, average 2/10           Plan - 01/29/19 1458    Clinical Impression Statement  Spring reporting some pain/difficulty with L wrist flexion with 2# soup can at home thus started session today reviewing this activity.  HEP review revealed pt. primary concern was remaining AROM deficit with 2# wrist flexion  which therapist explained was normal at this point as pt. does not yet have full wrist flexion AROM yet likely due to tight wrist musculature.  Pt. expressing concern that her AROM of elbow and wrist was "behind schedule" thus measured these areas today with pt. demonstrating significant improvement in all AROM of wrist, elbow today.  Pt. progressing toward LTG #2.  Pt. verbalized understanding of this and visible reduction in anxiety following.  Spring able to progress either repetitions or resistance with all strengthening activities today per protocol.  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  Pt. able to demo improved strength in all muscle groups of elbow, forearm, wrist, and grip strength today progressing toward LTG #3.  Still most limited with ADLs and with strength testing in grip strength with L ~ 8# grip strength as compared to ~ 45# on R grip strength.  Updated home program to end session today to continue focusing on remaining strength deficits.  Progressing well per protocol.    Personal Factors and Comorbidities  Comorbidity 3+;Fitness;Past/Current Experience;Profession;Time since onset of injury/illness/exacerbation    Comorbidities  kidney stone, depression, bipolar, anxiety, anemia    Rehab Potential  Good    PT Treatment/Interventions  ADLs/Self Care Home Management;Cryotherapy;Electrical Stimulation;Moist Heat;Therapeutic exercise;Therapeutic activities;Ultrasound;Neuromuscular re-education;Patient/family education;Manual techniques;Vasopneumatic Device;Taping;Splinting;Energy conservation;Dry needling;Passive range of motion;Scar mobilization    PT Next Visit Plan  progress per protocol; DN to L wrist flexors, extensors, brachioradialis    Consulted and Agree with Plan of Care  Patient       Patient will benefit from skilled therapeutic intervention in order  to improve the following deficits and impairments:  Hypomobility, Increased edema, Decreased scar mobility, Decreased activity tolerance, Increased fascial restricitons, Pain, Impaired UE functional use, Improper body mechanics, Decreased range of motion, Impaired flexibility, Postural dysfunction  Visit Diagnosis: Pain in left elbow  Stiffness of left elbow, not elsewhere classified  Stiffness of left wrist, not elsewhere classified  Muscle weakness (generalized)     Problem List Patient Active Problem List   Diagnosis Date Noted  . Right carpal tunnel syndrome 12/09/2018  . Closed nondisplaced fracture of fifth left metatarsal bone 05/10/2018  . History of shingles 01/08/2018  . Menopause 01/08/2018  . Left ankle injury, subsequent encounter 08/04/2017  . PCP NOTES >>>>>>>>>> 07/11/2017  . 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 Chronic knee pain 04/18/2015  . Plantar fasciitis 04/18/2015  . Tobacco use disorder 03/15/2015  . Morbid obesity ([place]) 01/05/2015    Bess Harvest, PTA 01/29/19 4:24 PM   [facility] [address] [facility] [address]  [place], [place], [address] Phone: [phone]   Fax:  [phone]  Name: Ashley Franklin MRN: [id number] Date of Birth: [birth year]
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
## 2019-01-29 NOTE — Patient Instructions (Signed)
We are referring you to neurology  Do not drive until you see neurology  Continue same medications as before

## 2019-01-30 ENCOUNTER — Encounter: Payer: Self-pay | Admitting: Internal Medicine

## 2019-01-30 NOTE — Assessment & Plan Note (Signed)
Syncope:  As described above, she reports 2 episodes of bladder incontinence, she has never hurt herself during these episodes. Recent labs satisfactory DDX is very large but includes a seizure disorder, psychogenic syncope, arrhythmias, narcolepsy, others. EKG is not available today, will call her as soon as we can when our EKG machine is repair. Refer to neurology . Strongly recommend not to drive until we clarify her diagnosis OSA: Reports good CPAP complain Bipolar, anxiety: Not optimally controlled, recommend to discuss with psych Morbid obesity: Interested in change her habits, information about the wellness clinic provided RTC 3 months

## 2019-02-03 ENCOUNTER — Encounter: Payer: Self-pay | Admitting: Physical Therapy

## 2019-02-03 ENCOUNTER — Other Ambulatory Visit: Payer: Self-pay

## 2019-02-03 ENCOUNTER — Ambulatory Visit: Payer: PRIVATE HEALTH INSURANCE | Admitting: Physical Therapy

## 2019-02-03 DIAGNOSIS — M25622 Stiffness of left elbow, not elsewhere classified: Secondary | ICD-10-CM

## 2019-02-03 DIAGNOSIS — M25632 Stiffness of left wrist, not elsewhere classified: Secondary | ICD-10-CM

## 2019-02-03 DIAGNOSIS — M25522 Pain in left elbow: Secondary | ICD-10-CM | POA: Diagnosis not present

## 2019-02-03 DIAGNOSIS — M6281 Muscle weakness (generalized): Secondary | ICD-10-CM

## 2019-02-03 NOTE — Therapy (Signed)
[facility] [address]  [place], [place], [address] Phone: [phone]   Fax:  [phone]  Physical Therapy Treatment  Patient Details  Name: Ashley Franklin MRN: [id number] Date of Birth: 05-11-1971 Referring Provider (PT): Milly Jakob, MD   Encounter Date: 02/03/2019  PT End of Session - 02/03/19 1446    Visit Number  15    Number of Visits  21    Date for PT Re-Evaluation  02/17/19    Authorization Type  WC: 1 eval + 8 tx + 8 tx    Authorization - Visit Number  15    Authorization - Number of Visits  17    PT Start Time  9201    PT Stop Time  1536    PT Time Calculation (min)  50 min    Activity Tolerance  Patient tolerated treatment well    Behavior During Therapy  [facility] for tasks assessed/performed       Past Medical History:  Diagnosis Date  . Anemia   . Anxiety   . Bipolar affective disorder ([place])   . Blood transfusion without reported diagnosis   . Bronchospasm with bronchitis, acute   . Depression   . Kidney stone   . Right carpal tunnel syndrome 12/09/2018  . SBO (small bowel obstruction) ([place]) 01/2014    Past Surgical History:  Procedure Laterality Date  . CESAREAN SECTION    . ENDOMETRIAL ABLATION    . GASTRIC BYPASS  2001  . SBO  2015    There were no vitals filed for this visit.  Subjective Assessment - 02/03/19 1449    Subjective  Pt reporting flare-up over the weekend but unsure of trigger.    Pertinent History  kidney stone, depression, bipolar, anxiety, anemia    Patient Stated Goals  return to work    Pain Score  3    2-3/10   Pain Location  Elbow    Pain Orientation  Left    Pain Descriptors / Indicators  Burning   "stinging"   Pain Type  Acute pain;Surgical pain                       [facility] Adult PT Treatment/Exercise - 02/03/19 1446      Shoulder Exercises: ROM/Strengthening   UBE (Upper Arm Bike)  L1.5 x 3 min forward/3 min backwards   using R UE  > L UE     Manual Therapy   Manual Therapy  Soft tissue mobilization;Myofascial release    Manual therapy comments  supine    Soft tissue mobilization  STM to L brachioradialis, wrist extensors, lateral wrist flexors- palpable painful trigger points evident throughout    Myofascial Release  manual TPR to L wrist extensors, flexors, brachioradialis- tenderness reported       Trigger Point Dry Needling - 02/03/19 1446    Consent Given?  Yes    Education Handout Provided  Previously provided    Muscles Treated Upper Quadrant  Brachioradialis   Left   Muscles Treated Wrist/Hand  Flexor carpi radialis;Flexor carpi ulnaris;Extensor carpi radialis longus/brevis;Extensor digitorum;Extensor carpi ulnaris   Left   Brachioradialis Response  Twitch response elicited;Palpable increased muscle length    Flexor carpi radialis Response  Twitch response elicited;Palpable increased muscle length    Flexor carpi ulnaris Response  Twitch response elicited;Palpable increased muscle length    Extensor carpi radialis longus/brevis Response  Twitch response elicited;Palpable increased muscle length  Extensor digitorum Response  Twitch response elicited;Palpable increased muscle length    Extensor carpi ulnaris Response  Twitch response elicited;Palpable increased muscle length             PT Short Term Goals - 01/06/19 1410      PT SHORT TERM GOAL #1   Title  Independent w/ inital HEP     Time  3    Period  Weeks    Status  Achieved    Target Date  12/31/18        PT Long Term Goals - 01/29/19 1543      PT LONG TERM GOAL #1   Title  Independent w/ advanced HEP.    Time  6    Period  Weeks    Status  Partially Met      PT LONG TERM GOAL #2   Title  Pt. will demonstrate an increase in Lt elbow and wrist AROM to WNL to increase function.    Time  6    Period  Weeks    Status  Partially Met      PT LONG TERM GOAL #3   Title  Pt. will demonstrate increase in L elbow/wrist strength to  5/5 and Lt grip strength to >40 lbs to allow for increased elbow stability and functional use of arm.    Time  6    Period  Weeks    Status  On-going      PT LONG TERM GOAL #4   Title  Patient to report 90% improvement in L elbow pain.    Time  6    Period  Weeks    Status  Partially Met   01/29/19: reports 75% improvement     PT LONG TERM GOAL #5   Title  Pt will be able to perform her ususal ADL's and work duties with less than 2/10 overall pain.    Time  6    Period  Weeks    Status  On-going   01/29/19: Pt. noting daily activities pain at worst rising to 4/10, average 2/10           Plan - 02/03/19 1452    Clinical Impression Statement  Spring reporting flare-up of pain over weekend without known trigger and looking forward to o DN today. Treatment focusing on manual therapy incorporating DN at primary PT request and with informed patient consent. Extensive TPs and taut bands identified throughout L brachioradialis, wrist flexor and extensor groups which were addressed with DN and manual therapy with good twitch responses and palpable reduction in muscle tension as well as patient noting significant relief of pain. Patient encouraged to follow up with HEP stretches and exercises to further promote normalization of muscle tension.    Personal Factors and Comorbidities  Comorbidity 3+;Fitness;Past/Current Experience;Profession;Time since onset of injury/illness/exacerbation    Comorbidities  kidney stone, depression, bipolar, anxiety, anemia    Rehab Potential  Good    PT Treatment/Interventions  ADLs/Self Care Home Management;Cryotherapy;Electrical Stimulation;Moist Heat;Therapeutic exercise;Therapeutic activities;Ultrasound;Neuromuscular re-education;Patient/family education;Manual techniques;Vasopneumatic Device;Taping;Splinting;Energy conservation;Dry needling;Passive range of motion;Scar mobilization    PT Next Visit Plan  progress per protocol; assess response to DN to L wrist  flexors, extensors, brachioradialis    Consulted and Agree with Plan of Care  Patient       Patient will benefit from skilled therapeutic intervention in order to improve the following deficits and impairments:  Hypomobility, Increased edema, Decreased scar mobility, Decreased activity tolerance, Increased fascial restricitons, Pain, Impaired  UE functional use, Improper body mechanics, Decreased range of motion, Impaired flexibility, Postural dysfunction  Visit Diagnosis: Pain in left elbow  Stiffness of left elbow, not elsewhere classified  Stiffness of left wrist, not elsewhere classified  Muscle weakness (generalized)     Problem List Patient Active Problem List   Diagnosis Date Noted  . Right carpal tunnel syndrome 12/09/2018  . Closed nondisplaced fracture of fifth left metatarsal bone 05/10/2018  . History of shingles 01/08/2018  . Menopause 01/08/2018  . Left ankle injury, subsequent encounter 08/04/2017  . PCP NOTES >>>>>>>>>> 07/11/2017  . 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 Chronic knee pain 04/18/2015  . Plantar fasciitis 04/18/2015  . Tobacco use disorder 03/15/2015  . Morbid obesity ([place]) 01/05/2015  . Bipolar affective ([place]) 01/02/2015    Percival Spanish, PT, MPT 02/03/2019, 3:48 PM  [facility] [address]  [place], [place], [address] Phone: [phone]   Fax:  [phone]  Name: Ashley Franklin MRN: [id number] Date of Birth: 12-Oct-1970

## 2019-02-04 ENCOUNTER — Telehealth: Payer: Self-pay | Admitting: Neurology

## 2019-02-04 ENCOUNTER — Encounter: Payer: Self-pay | Admitting: Internal Medicine

## 2019-02-04 NOTE — Telephone Encounter (Signed)
We could offer 02/17/2019 at 730 am. This would be within the next two weeks.

## 2019-02-04 NOTE — Telephone Encounter (Signed)
Pt is a returning patient of Dr. Jannifer Franklin. She has a urgent Internal referral for syncope- need to rule out seizures. When would you like the patient to be seen?

## 2019-02-04 NOTE — Telephone Encounter (Signed)
Pt accepted 02/17/2019 at 730 appt.

## 2019-02-05 ENCOUNTER — Other Ambulatory Visit: Payer: Self-pay

## 2019-02-05 ENCOUNTER — Ambulatory Visit (INDEPENDENT_AMBULATORY_CARE_PROVIDER_SITE_OTHER): Payer: No Typology Code available for payment source | Admitting: *Deleted

## 2019-02-05 ENCOUNTER — Ambulatory Visit: Payer: PRIVATE HEALTH INSURANCE

## 2019-02-05 ENCOUNTER — Telehealth (HOSPITAL_COMMUNITY): Payer: Self-pay | Admitting: Psychiatry

## 2019-02-05 ENCOUNTER — Telehealth (HOSPITAL_COMMUNITY): Payer: Self-pay | Admitting: *Deleted

## 2019-02-05 ENCOUNTER — Other Ambulatory Visit (HOSPITAL_COMMUNITY): Payer: Self-pay | Admitting: Psychiatry

## 2019-02-05 DIAGNOSIS — M25522 Pain in left elbow: Secondary | ICD-10-CM

## 2019-02-05 DIAGNOSIS — M6281 Muscle weakness (generalized): Secondary | ICD-10-CM

## 2019-02-05 DIAGNOSIS — R55 Syncope and collapse: Secondary | ICD-10-CM

## 2019-02-05 DIAGNOSIS — M25622 Stiffness of left elbow, not elsewhere classified: Secondary | ICD-10-CM

## 2019-02-05 DIAGNOSIS — M25632 Stiffness of left wrist, not elsewhere classified: Secondary | ICD-10-CM

## 2019-02-05 MED ORDER — DIAZEPAM 5 MG PO TABS: 2.5000 mg | ORAL_TABLET | Freq: Two times a day (BID) | ORAL | 0 refills | Status: DC | PRN

## 2019-02-05 MED FILL — diazePAM 5 MG TABS: 5 | 15 days supply | Qty: 15 | Fill #0

## 2019-02-05 NOTE — Telephone Encounter (Signed)
Discussed with the patient. She has an episode of syncope, and is seen by a neurologist. She is unable to drive due to the situation. She feels depressed about the situation. She denies SI. She denies any hypomanic/manic episode. She wonders if she can discontinue her psychiatry medication as she is concerned about its potential effect on her physical condition. She has been able to taper down Valium/Ambien as discussed at the prior visit without any issues. Will taper down valium/Ambien to avoid hypersomnolence.   Discussed with the patient to stay on medication for bipolar disorder, anxiety at this time. Recommended the following:  - Decrease Valium 2.5 mg twice a day as needed for anxiety  - Discontinue Ambien - Cancel appointment on 9/29: reschedule to 10/9 at 9:20 for 30 mins, video (after appointment with neuro)

## 2019-02-05 NOTE — Progress Notes (Signed)
EKG reviewed: NSR, has T wave inversions likely nonspecific, she had TWI in some of her old EKGs. Kathlene November, MD

## 2019-02-05 NOTE — Progress Notes (Signed)
Patient in today for EKG per Dr Larose Kells for syncope.  She was her on 01/29/19 and EKG was down.  She states that she feels better.  She has an appointment with neurologist on 02/17/19.    She would like a letter stated that she is unable to drive for her work.    Per Dr Larose Kells her EKG looks fine and to make sure she keeps appointment with neurologist.  Note for work provided.  Patient notified of plan and will keep appointment with neurologist.

## 2019-02-05 NOTE — Telephone Encounter (Signed)
PATIENT CALLED STATING THAT SHE NEED TO DISCUSS WITH THE PROVIDER COMING OFF ALL HER MED'S. SHE STATED THAT SHE HAS A APPOINTMENT TO SEE  THE NEUROLOGIST DR WILLIS & WANTS TO STOP TAKING HER MED'S TO HELP DETERMINE WHAT IS / ISN'T THE CAUSE(?) OF THE PROBLEM. SHE STATED SHE KNOWS NOT TO STOP TAKING HER MED'S COLD [place]. AND WILL DISCUSS WITH PROVIDER IN DETAIL. APPT MADE FOR  8:00AM  02/11/2019.

## 2019-02-05 NOTE — Therapy (Addendum)
[facility] [address]  [place], [place], [address] Phone: [phone]   Fax:  [phone]  Physical Therapy Treatment  Patient Details  Name: Ashley Franklin MRN: [id number] Date of Birth: 23-Feb-1971 Referring Provider (PT): Milly Jakob, MD   Encounter Date: 02/05/2019  PT End of Session - 02/05/19 1456    Visit Number  16    Number of Visits  21    Date for PT Re-Evaluation  02/17/19    Authorization Type  WC: 1 eval + 8 tx + 8 tx    Authorization - Visit Number  16    Authorization - Number of Visits  17    PT Start Time  0051    PT Stop Time  1530    PT Time Calculation (min)  45 min    Activity Tolerance  Patient tolerated treatment well    Behavior During Therapy  [facility] for tasks assessed/performed       Past Medical History:  Diagnosis Date  . Anemia   . Anxiety   . Bipolar affective disorder ([place])   . Blood transfusion without reported diagnosis   . Bronchospasm with bronchitis, acute   . Depression   . Kidney stone   . Right carpal tunnel syndrome 12/09/2018  . SBO (small bowel obstruction) ([place]) 01/2014    Past Surgical History:  Procedure Laterality Date  . CESAREAN SECTION    . ENDOMETRIAL ABLATION    . GASTRIC BYPASS  2001  . SBO  2015    There were no vitals filed for this visit.  Subjective Assessment - 02/05/19 1449    Subjective  Pt. noting recent episodes of syncope which have been frequent daily and this happened last night while seated in home with husband.  Pt. noting she has seen MD about this earlier today and the snycope episodes have not led to a fall since last Christmas.  Pt. noting MD did restrict her from driving due to syncope episodes.    Pertinent History  kidney stone, depression, bipolar, anxiety, anemia    Currently in Pain?  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 and L wrist pain with turning cart to L       Shoulder Exercises: ROM/Strengthening   UBE (Upper Arm Bike)  L1.5 x 3 min forward/3 min backwards      Wrist Exercises   Wrist Flexion  AROM;Left;10 reps;Strengthening;Bar weights/barbell    Bar Weights/Barbell (Wrist Flexion)  3 lbs    Wrist Extension  AROM;Left;10 reps    Bar Weights/Barbell (Wrist Extension)  3 lbs    Wrist Radial Deviation  Left;20 reps;Strengthening;Bar weights/barbell    Wrist Radial Deviation Limitations  End grip on hammer  Wrist Ulnar Deviation  Left;20 reps;Theraband;Strengthening    Wrist Ulnar Deviation Limitations  end grip on hammer     Other wrist exercises  L wrist flexion/extension stretch with extended elbow 20" each to tolerance               PT Short Term Goals - 01/06/19 1410      PT SHORT TERM GOAL #1   Title  Independent w/ inital HEP     Time  3    Period  Weeks    Status  Achieved    Target Date  12/31/18        PT Long Term Goals - 02/05/19 1505      PT LONG TERM GOAL #1   Title  Independent w/ advanced HEP.    Time  6    Period  Weeks    Status  Partially Met      PT LONG TERM GOAL #2   Title  Pt. will demonstrate an increase in Lt elbow and wrist AROM to WNL to increase function.    Time  6    Period  Weeks    Status  Partially Met      PT LONG TERM GOAL #3   Title  Pt. will demonstrate increase in L elbow/wrist strength to 5/5 and Lt grip strength to >40 lbs to allow for increased elbow stability and functional use of arm.    Time  6    Period  Weeks    Status  On-going      PT  LONG TERM GOAL #4   Title  Patient to report 90% improvement in L elbow pain.    Time  6    Period  Weeks    Status  Partially Met   01/29/19: reports 75% improvement     PT LONG TERM GOAL #5   Title  Pt will be able to perform her ususal ADL's and work duties with less than 2/10 overall pain.    Time  6    Period  Weeks    Status  On-going   01/29/19: Pt. noting daily activities pain at worst rising to 4/10, average 2/10           Plan - 02/05/19 1459    Clinical Impression Statement  Pt. noting she has had some episodes of syncope recently.  She has not seen an identifiable trigger to these episodes of syncope however did see MD earlier today to run some tests to identify the cause.  Pt. reporting she is still waiting to hear back from MD regarding tests however in the meantime has been restricted from driving.  Pt. reporting L wrist has been doing well with no new complaints.  Pt. noting some improvement since DN last session.  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 demonstrating difficult turning cart to L which requires increased wrist strain with some reported L elbow pain.  Ended visit pain free thus modalities deferred.  Pt. progressing well per protocol.    Personal Factors and Comorbidities  Comorbidity 3+;Fitness;Past/Current Experience;Profession;Time since onset of injury/illness/exacerbation    Comorbidities  kidney stone, depression, bipolar, anxiety, anemia    Rehab Potential  Good    PT Treatment/Interventions  ADLs/Self Care Home Management;Cryotherapy;Electrical Stimulation;Moist Heat;Therapeutic exercise;Therapeutic activities;Ultrasound;Neuromuscular re-education;Patient/family education;Manual techniques;Vasopneumatic Device;Taping;Splinting;Energy conservation;Dry needling;Passive range of motion;Scar mobilization    PT Next Visit Plan  progress per protocol    Consulted and Agree with Plan of Care  Patient       Patient will benefit from skilled therapeutic intervention in order to improve the following deficits and impairments:  Hypomobility, Increased edema, Decreased scar mobility, Decreased activity tolerance, Increased fascial restricitons, Pain, Impaired UE functional use, Improper body mechanics, Decreased range of motion, Impaired flexibility, Postural dysfunction  Visit Diagnosis: Pain in left elbow  Stiffness of left elbow, not elsewhere classified  Stiffness of left wrist, not elsewhere classified  Muscle weakness (generalized)     Problem List Patient Active Problem List   Diagnosis Date Noted  . Right carpal tunnel syndrome 12/09/2018  . Closed nondisplaced fracture of fifth left metatarsal bone 05/10/2018  . History of shingles 01/08/2018  . Menopause 01/08/2018  . Left ankle injury, subsequent encounter 08/04/2017  . PCP NOTES >>>>>>>>>> 07/11/2017  . 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 Chronic knee pain 04/18/2015  . Plantar fasciitis 04/18/2015  . Tobacco use disorder 03/15/2015  . Morbid obesity ([place]) 01/05/2015  . Bipolar affective ([place]) 01/02/2015    Bess Harvest, PTA 02/05/19 6:31 PM   [facility] [address]  [place], [place], [address] Phone: [phone]   Fax:  [phone]  Name: PHOEBE MARTER MRN: [id number] Date of Birth: [birth year]

## 2019-02-05 NOTE — Telephone Encounter (Signed)
Discussed with the patient. She has an episode of syncope, and is seen by a neurologist. She is unable to drive due to the situation. She feels depressed about the situation. She denies SI. She denies any hypomanic/manic episode. She wonders if she can discontinue her psychiatry medication as she is concerned about its potential effect on her physical condition. She has been able to taper down Valium/Ambien as discussed at the prior visit without any issues. Will taper down valium/Ambien to avoid hypersomnolence.   Discussed with the patient to stay on medication for bipolar disorder, anxiety at this time. Recommended the following:  - Decrease Valium 2.5 mg twice a day as needed for anxiety  - Discontinue Ambien - Cancel appointment on 9/29: reschedule to 10/9 at 9:20 for 30 mins, video (after appointment with neuro) 

## 2019-02-05 NOTE — Telephone Encounter (Signed)
Left voice message to contact the office. Could you ask the following when she returns a call:  - If she has any concern of her current mood symptoms - If she was able to reduce valium/ambien as discussed at her last visit - Did her neurologist wants her to discontinue all of her psychiatry medication?

## 2019-02-10 ENCOUNTER — Ambulatory Visit: Payer: PRIVATE HEALTH INSURANCE | Admitting: Physical Therapy

## 2019-02-10 ENCOUNTER — Other Ambulatory Visit: Payer: Self-pay

## 2019-02-10 ENCOUNTER — Encounter: Payer: Self-pay | Admitting: Physical Therapy

## 2019-02-10 DIAGNOSIS — M25622 Stiffness of left elbow, not elsewhere classified: Secondary | ICD-10-CM

## 2019-02-10 DIAGNOSIS — M6281 Muscle weakness (generalized): Secondary | ICD-10-CM

## 2019-02-10 DIAGNOSIS — M25522 Pain in left elbow: Secondary | ICD-10-CM

## 2019-02-10 DIAGNOSIS — M25632 Stiffness of left wrist, not elsewhere classified: Secondary | ICD-10-CM

## 2019-02-10 NOTE — Therapy (Signed)
[facility] [address]  [place], [place], [address] Phone: [phone]   Fax:  [phone]  Physical Therapy Treatment  Patient Details  Name: Ashley Franklin MRN: [id number] Date of Birth: 06/13/1970 Referring Provider (PT): Milly Jakob, MD   Encounter Date: 02/10/2019  PT End of Session - 02/10/19 1635    Visit Number  17    Number of Visits  25    Date for PT Re-Evaluation  03/10/19    Authorization Type  WC: 1 eval + 8 tx + 8 tx +8 tx    Authorization - Visit Number  47    Authorization - Number of Visits  25    PT Start Time  1447    PT Stop Time  1530    PT Time Calculation (min)  43 min    Activity Tolerance  Patient tolerated treatment well;Patient limited by pain    Behavior During Therapy  [facility] for tasks assessed/performed       Past Medical History:  Diagnosis Date  . Anemia   . Anxiety   . Bipolar affective disorder ([place])   . Blood transfusion without reported diagnosis   . Bronchospasm with bronchitis, acute   . Depression   . Kidney stone   . Right carpal tunnel syndrome 12/09/2018  . SBO (small bowel obstruction) ([place]) 01/2014    Past Surgical History:  Procedure Laterality Date  . CESAREAN SECTION    . ENDOMETRIAL ABLATION    . GASTRIC BYPASS  2001  . SBO  2015    There were no vitals filed for this visit.  Subjective Assessment - 02/10/19 1447    Subjective  MD raised her lifting limit to 5lbs. Now able to perform lab draws in an outpatient setting, but is not driving and out of work d/t recent problems with syncope. MD cleared her her for 8 more visits. Has improved on cleaning dishes, folding clothes, but still having most problems loading/unloading the washer and dryer and is avoiding pushing the cart at the grocery store. No longer having sharp pain in her elbow.    Pertinent History  kidney stone, depression, bipolar, anxiety, anemia    Patient Stated Goals  return to work     Currently in Pain?  No/denies         [facility] PT Assessment - 02/10/19 0001      Assessment   Medical Diagnosis  s/p L lateral epicondylitis debridement    Referring Provider (PT)  Milly Jakob, MD    Onset Date/Surgical Date  11/29/18      Observation/Other Assessments   Focus on Therapeutic Outcomes (FOTO)   elbow: 50% limited, 38% predicted      AROM   Left Elbow Flexion  141    Left Elbow Extension  2    Left Wrist Extension  72 Degrees    Left Wrist Flexion  79 Degrees    Left Wrist Radial Deviation  20 Degrees    Left Wrist Ulnar Deviation  40 Degrees      Strength   Left Elbow Flexion  4+/5    Left Elbow Extension  4/5    Left Wrist Flexion  4+/5    Left Wrist Extension  4/5    Left Wrist Radial Deviation  4+/5    Left Wrist Ulnar Deviation  4+/5    Left Hand Grip (lbs)  3.67   5,3,3  [facility] PT Treatment/Exercise - 02/10/19 0001      Shoulder Exercises: Standing   Other Standing Exercises  L UE overhead lift with 4# x5; 2# 5x   4/10 pain with 4#, 3/10 2#     Shoulder Exercises: ROM/Strengthening   UBE (Upper Arm Bike)  L1.5 x 3 min forward/3 min backwards    Cybex Row Limitations  narrow grip row 10x 10#; 10x 15#    Other ROM/Strengthening Exercises  straight arm pulldown 5x5#; 10x7#   cues for shoulder depression            PT Education - 02/10/19 1634    Education Details  update to HEP; discussion on objective progress with PT and remaining impairments    Person(s) Educated  Patient    Methods  Explanation;Demonstration;Tactile cues;Verbal cues;Handout    Comprehension  Verbalized understanding;Returned demonstration       PT Short Term Goals - 02/10/19 1451      PT SHORT TERM GOAL #1   Title  Independent w/ inital HEP     Time  3    Period  Weeks    Status  Achieved    Target Date  12/31/18        PT Long Term Goals - 02/10/19 1451      PT LONG TERM GOAL #1   Title  Independent w/ advanced HEP.     Time  4    Period  Weeks    Status  Partially Met   met for current   Target Date  03/10/19      PT LONG TERM GOAL #2   Title  Pt. will demonstrate an increase in Lt elbow and wrist AROM to WNL to increase function.    Time  4    Period  Weeks    Status  Partially Met   showing improvements in L elbow extension, wrist extension, and radial and ulnar deviation AROM   Target Date  03/10/19      PT LONG TERM GOAL #3   Title  Pt. will demonstrate increase in L elbow/wrist strength to 5/5 and Lt grip strength to >40 lbs to allow for increased elbow stability and functional use of arm.    Time  4    Period  Weeks    Status  On-going   showing improvement in L elbow flexion strength, grip strength still limited to 3.67 lbs   Target Date  03/10/19      PT LONG TERM GOAL #4   Title  Patient to report 90% improvement in L elbow pain.    Time  4    Period  Weeks    Status  Partially Met   02/10/19: reports 70% improvement   Target Date  03/10/19      PT LONG TERM GOAL #5   Title  Pt will be able to perform her ususal ADL's and work duties with less than 2/10 overall pain.    Time  4    Period  Weeks    Status  On-going   Reports that she does not push herself past 3/10 pain at home before she stops the activity   Target Date  03/10/19            Plan - 02/10/19 1642    Clinical Impression Statement  Patient reporting 70% improvement in L elbow pain since initial eval. Saw MD recently who recommended extension of POC with PT. Patient does note improvement in ability to  perform tasks like washing dishes and fold clothes, but still having trouble with loading/unloading the washer/dryer, pushing a grocery cart, and lifting a 2 liter bottle overhead. Patient is showing improvements in L elbow extension, wrist extension, and radial and ulnar deviation AROM, with ROM now nearly WNL. Demonstrating improvement in L elbow flexion strength, with wrist extension strength now most limiting.  Patient's biggest limitation at this time continues to be grip strength- today measuring 3.67 lbs. Introduced overhead dumbbell lift to simulate patient's difficulty with lifting as well as machine strengthening requiring weighted grasping activities. Patient with most difficulty with overhead lifting, with mild pain at low weight. Updated HEP with an activity requiring patient to lift an empty 2-liter bottle overhead, and instructed her to add water as tolerated. Patient reported understanding and with no complaints. Patient progressing well, would benefit from continued skilled PT services 2x/week for 4 weeks to address remaining impairments and return to PLOF.    Comorbidities  kidney stone, depression, bipolar, anxiety, anemia    Rehab Potential  Good    PT Frequency  2x / week    PT Duration  4 weeks    PT Treatment/Interventions  ADLs/Self Care Home Management;Cryotherapy;Electrical Stimulation;Moist Heat;Therapeutic exercise;Therapeutic activities;Ultrasound;Neuromuscular re-education;Patient/family education;Manual techniques;Vasopneumatic Device;Taping;Splinting;Energy conservation;Dry needling;Passive range of motion;Scar mobilization    PT Next Visit Plan  continue to simulate ADL and work activities requiring full-handed grasp    Consulted and Agree with Plan of Care  Patient       Patient will benefit from skilled therapeutic intervention in order to improve the following deficits and impairments:  Hypomobility, Increased edema, Decreased scar mobility, Decreased activity tolerance, Increased fascial restricitons, Pain, Impaired UE functional use, Improper body mechanics, Decreased range of motion, Impaired flexibility, Postural dysfunction  Visit Diagnosis: Pain in left elbow  Stiffness of left elbow, not elsewhere classified  Stiffness of left wrist, not elsewhere classified  Muscle weakness (generalized)     Problem List Patient Active Problem List   Diagnosis Date Noted   . Right carpal tunnel syndrome 12/09/2018  . Closed nondisplaced fracture of fifth left metatarsal bone 05/10/2018  . History of shingles 01/08/2018  . Menopause 01/08/2018  . Left ankle injury, subsequent encounter 08/04/2017  . PCP NOTES >>>>>>>>>> 07/11/2017  . 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 Chronic knee pain 04/18/2015  . Plantar fasciitis 04/18/2015  . Tobacco use disorder 03/15/2015  . Morbid obesity ([place]) 01/05/2015  . Bipolar affective ([place]) 01/02/2015     Janene Harvey, PT, DPT 02/10/19 4:46 PM   [facility] [address]  [place], [place], [address] Phone: [phone]   Fax:  [phone]  Name: SVARA TWYMAN MRN: [id number] Date of Birth: 18-Jan-1971

## 2019-02-11 ENCOUNTER — Ambulatory Visit: Payer: No Typology Code available for payment source | Admitting: Psychology

## 2019-02-11 ENCOUNTER — Ambulatory Visit (HOSPITAL_COMMUNITY): Payer: No Typology Code available for payment source | Admitting: Psychiatry

## 2019-02-11 ENCOUNTER — Telehealth: Payer: Self-pay

## 2019-02-11 NOTE — Telephone Encounter (Signed)
FMLA paperwork received. Placed in PCP red folder for completion.

## 2019-02-12 ENCOUNTER — Other Ambulatory Visit: Payer: Self-pay

## 2019-02-12 ENCOUNTER — Ambulatory Visit: Payer: PRIVATE HEALTH INSURANCE | Admitting: Physical Therapy

## 2019-02-12 ENCOUNTER — Encounter: Payer: Self-pay | Admitting: Physical Therapy

## 2019-02-12 DIAGNOSIS — M6281 Muscle weakness (generalized): Secondary | ICD-10-CM

## 2019-02-12 DIAGNOSIS — M25522 Pain in left elbow: Secondary | ICD-10-CM

## 2019-02-12 DIAGNOSIS — M25632 Stiffness of left wrist, not elsewhere classified: Secondary | ICD-10-CM

## 2019-02-12 DIAGNOSIS — M25622 Stiffness of left elbow, not elsewhere classified: Secondary | ICD-10-CM

## 2019-02-12 DIAGNOSIS — Z0279 Encounter for issue of other medical certificate: Secondary | ICD-10-CM

## 2019-02-12 NOTE — Progress Notes (Signed)
Virtual Visit via Video Note  I connected with Ashley Franklin on 02/21/19 at  9:20 AM EDT by a video enabled telemedicine application and verified that I am speaking with the correct person using two identifiers.   I discussed the limitations of evaluation and management by telemedicine and the availability of in person appointments. The patient expressed understanding and agreed to proceed.     I discussed the assessment and treatment plan with the patient. The patient was provided an opportunity to ask questions and all were answered. The patient agreed with the plan and demonstrated an understanding of the instructions.   The patient was advised to call back or seek an in-person evaluation if the symptoms worsen or if the condition fails to improve as anticipated.  I provided 25 minutes of non-face-to-face time during this encounter.   Neysa Hottereina Josyah Achor, MD    [facility] MD/PA/NP OP Progress Note  02/21/2019 9:51 AM Ashley Franklin  MRN:  [id number]  Chief Complaint:  Chief Complaint    Follow-up; Other     HPI:  Discussed the following since the last visit to avoid drowsiness - Decrease Valium 2.5 mg twice a day as needed for anxiety  - Discontinue Ambien  Reviewed record from neurologist. R/o diazepam toxicity. Pending EEG. Ammonium level,  Lamotrigine level was checked. Discontinued gabapentin.   This is a follow-up appointment for drowsiness and bipolar disorder.  She states that she has been able to follow the instructions as described above.  Although she feels anxious about bills due to recent medical visit, she believes that she has been handling things well.  She denies feeling depressed as she does not have stress at work anymore. (She is not allowed to work or drive due to her episodes of drowsiness).  She tries to keep herself busy by doing house chores, crafts. She goes to PT twice a week s/p surgery for carpel tunnel syndrome. She did have an episode of loss of consciousness  without awareness for about an hour when she was watching TV. It is not witnessed. It occurs four days per night. She has middle insomnia. She sleeps 10 pm-2 am. She has fair motivation and energy.  She has fair concentration.  She has been trying to walk around the neighborhood.  She is also planning to go to the gym with her husband, who will be able to drive her to the place.  She has good appetite.  She has not noticed any weight gain.  She feels anxious and tense at times.  She denies panic attacks.  She denies decreased need for sleep, euphoria or irritability.  Although the patient believes her hand tremors has been improving, her husband notices it occasionally (unknown whether it is resting or postrual).   Visit Diagnosis:    ICD-10-CM   1. Bipolar affective disorder, currently depressed, mild (HCC)  F31.31 FLUoxetine (PROZAC) 20 MG tablet  2. Drowsiness  R40.0     Past Psychiatric History: Please see initial evaluation for full details. I have reviewed the history. No updates at this time.     Past Medical History:  Past Medical History:  Diagnosis Date  . Anemia   . Anxiety   . Bipolar affective disorder (HCC)   . Blood transfusion without reported diagnosis   . Bronchospasm with bronchitis, acute   . Depression   . Kidney stone   . Right carpal tunnel syndrome 12/09/2018  . SBO (small bowel obstruction) ([facility]) 01/2014    Past Surgical  History:  Procedure Laterality Date  . CESAREAN SECTION    . ENDOMETRIAL ABLATION    . GASTRIC BYPASS  2001  . SBO  2015    Family Psychiatric History: Please see initial evaluation for full details. I have reviewed the history. No updates at this time.     Family History:  Family History  Problem Relation Age of Onset  . Diabetes Mother   . Cancer Mother 60       Throat   . Bipolar disorder Mother   . Bipolar disorder Sister   . Bipolar disorder Brother   . Drug abuse Brother   . Bipolar disorder Sister   . Colon cancer Neg Hx    . Cancer - Prostate Neg Hx   . Breast cancer Neg Hx     Social History:  Social History   Socioeconomic History  . Marital status: Married    Spouse name: Not on file  . Number of children: 2  . Years of education: Not on file  . Highest education level: Not on file  Occupational History  . Occupation: phelbotomist    Employer: [employer]  Social Needs  . Financial resource strain: Not on file  . Food insecurity    Worry: Not on file    Inability: Not on file  . Transportation needs    Medical: Not on file    Non-medical: Not on file  Tobacco Use  . Smoking status: Current Every Day Smoker    Packs/day: 0.25    Years: 6.00    Pack years: 1.50  . Smokeless tobacco: Never Used  . Tobacco comment: 1/2 ppd   Substance and Sexual Activity  . Alcohol use: Yes    Alcohol/week: 0.0 standard drinks    Comment: Two times a week.   . Drug use: No  . Sexual activity: Yes    Partners: Male    Birth control/protection: Surgical  Lifestyle  . Physical activity    Days per week: Not on file    Minutes per session: Not on file  . Stress: Not on file  Relationships  . Social Herbalist on phone: Not on file    Gets together: Not on file    Attends religious service: Not on file    Active member of club or organization: Not on file    Attends meetings of clubs or organizations: Not on file    Relationship status: Not on file  Other Topics Concern  . 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 Nsaids Other (See Comments)    Contraindicated with gastric bypass    Metabolic Disorder  Labs: Lab Results  Component Value Date   HGBA1C 4.9 02/06/2017   MPG 105 02/26/2015   No results found for: PROLACTIN Lab Results  Component Value Date   CHOL 165 02/06/2017   TRIG 87 02/06/2017   HDL 54 02/06/2017   LDLCALC 94 02/06/2017   Lab Results  Component Value Date   TSH  1.12 07/10/2017   TSH 1.22 06/08/2016    Therapeutic Level Labs: No results found for: LITHIUM Lab Results  Component Value Date   VALPROATE 71 01/27/2019   VALPROATE 64 10/02/2018   No components found for:  CBMZ  Current Medications: Current Outpatient Medications  Medication Sig Dispense Refill  . diazepam (VALIUM) 5 MG tablet Take 0.5 tablets (2.5 mg total) by mouth 2 (two) times daily as needed for anxiety. 15 tablet 0  . [START ON 03/30/2019] divalproex (DEPAKOTE ER) 500 MG 24 hr tablet Take 4 tablets (2,000 mg total) by mouth at bedtime. 360 tablet 1  . [START ON 04/02/2019] FLUoxetine (PROZAC) 20 MG tablet Take 4 tablets (80 mg total) by mouth daily. 360 tablet 1  . [START ON 04/02/2019] lamoTRIgine (LAMICTAL) 25 MG tablet Take 2 tablets (50 mg total) by mouth daily. 180 tablet 1  . Lurasidone HCl 60 MG TABS Take 1 tablet (60 mg total) by mouth daily. 90 tablet 1   No current facility-administered medications for this visit.      Musculoskeletal: Strength & Muscle Tone: N/A Gait & Station: N/A Patient leans: N/A  Psychiatric Specialty Exam: Review of Systems  Psychiatric/Behavioral: Negative for depression, hallucinations, memory loss, substance abuse and suicidal ideas. The patient is nervous/anxious and has insomnia.   All other systems reviewed and are negative.   There were no vitals taken for this visit.There is no height or weight on file to calculate BMI.  General Appearance: Fairly Groomed  Eye Contact:  Good  Speech:  Clear and Coherent  Volume:  Normal  Mood:  Anxious  Affect:  Appropriate, Congruent and calm  Thought Process:  Coherent  Orientation:  Full (Time,  Place, and Person)  Thought Content: Logical   Suicidal Thoughts:  No  Homicidal Thoughts:  No  Memory:  Immediate;   Good  Judgement:  Good  Insight:  Fair  Psychomotor Activity:  Normal  Concentration:  Concentration: Good and Attention Span: Good  Recall:  Good  Fund of Knowledge: Good  Language: Good  Akathisia:  No  Handed:  Right  AIMS (if indicated): not done  Assets:  Communication Skills Desire for Improvement  ADL's:  Intact  Cognition: WNL  Sleep:  Poor   Screenings: PHQ2-9     Nutrition from 03/06/2017 in [facility] Office Visit from 06/08/2016 in [facility] Office Visit from 03/15/2015 in [facility] Office Visit from 01/12/2015 in [facility]  PHQ-2 Total Score  0  0  0  0  PHQ-9 Total Score  -  2  -  -       Assessment and Plan:  KIMORI TARTAGLIA is a 48 y.o. year old female with a history of bipolar disorder, anxiety,anemia, obesity s/p gastric bypass surgery, who presents for follow up appointment for Bipolar affective disorder, currently depressed, mild (HCC) - Plan: FLUoxetine (PROZAC) 20 MG tablet  Drowsiness  # r/o narcolepsy # Generalized weakness # Oversedation (with loss of consciousness) She continues to report episodes of drowsiness weakness during the day despite tapering down Valium.  Labs checked (includes VPA, CMP) with no significant abnormality to be contributing to her drowsiness.  Will taper off Valium to avoid its potential side effect of drowsiness. Will continue other psychotropics at this time given benefit of treating her mood symptoms (she had at least a few episodes of relapse in the past) outweighs its potential  risk to cause drowsiness.  She has a follow-up with his neurologist for further evaluation.   # bipolar disorder, most recent episode depressed # Unspecified anxiety disorder Although she reports  anxiety in the context of not being able to drive/work due to her potential episode of seizure, she denies any other significant mood symptoms since her last visit.  Point continue Depakote to target mood dysregulation.  We will continue lamotrigine to target bipolar disorder.  Discussed risk of Stevens-Johnson syndrome.  Will continue fluoxetine to target depression.  We will continue Latuda for mood dysregulation. Will taper off valium as described above. Discussed behavioral activation, and sleep hygiene.   Plan: I have reviewed and updated plans as below 1.ContinueDepakote 2000 mg at night  3. Continue fluoxetine 80 mg daily  4.Continuelamotrigine50mg  daily 5.Continuelatuda 60 mg daily 6. Decrease Valium 2.5 mg daily for one week, then discontinue  8. Next appointment:in December - she sees a therapistweekly  Past trials of medication: Lexapro, Paxil, amitriptyline, Abilify (irritable), oxcarbazepine, perphenazine, Trifluoperazine, Klonopin, Valium. Vistaril, Ambien   The patient demonstrates the following risk factors for suicide: Chronic risk factorsfor suicide include psychiatric disorder /bipolar disorder,previous self-harm of scratching herself. Acute risk factorsfor suicide includenone. Protective factorsfor this patient include positive social support, positive therapeutic relationship,hope for the future. Considering these factors, the overall suicide risk at this point appears to be low. Patient does have gun access at home, which she declined to lock. Discussed in detail safety plan that anytime having active suicidal thoughts or homicidal thoughts and she need to call 911 or go to local emergency room.  The duration of this appointment visit was 25 minutes of non face-to-face time with the patient.  Greater than 50% of this time was spent in counseling, explanation of  diagnosis, planning of further management, and coordination of care.  [name],  MD 02/21/2019, 9:51 AM

## 2019-02-12 NOTE — Telephone Encounter (Signed)
Patient was seen 01/29/2019, will be seen by neurology 02/17/2019, will provide paperwork for incapacity until 02/28/2019. Could be released earlier depending on neurology evaluation outcome.

## 2019-02-12 NOTE — Therapy (Signed)
[facility] [address]  [place], [place], [address] Phone: [phone]   Fax:  [phone]  Physical Therapy Treatment  Patient Details  Name: Ashley Franklin MRN: [id number] Date of Birth: 1970/12/31 Referring Provider (PT): Milly Jakob, MD   Encounter Date: 02/12/2019  PT End of Session - 02/12/19 1609    Visit Number  18    Number of Visits  25    Date for PT Re-Evaluation  03/10/19    Authorization Type  WC: 1 eval + 8 tx + 8 tx +8 tx    Authorization - Visit Number  18    Authorization - Number of Visits  25    PT Start Time  6962    PT Stop Time  1611    PT Time Calculation (min)  40 min    Activity Tolerance  Patient tolerated treatment well;Patient limited by pain    Behavior During Therapy  [facility] for tasks assessed/performed       Past Medical History:  Diagnosis Date  . Anemia   . Anxiety   . Bipolar affective disorder ([place])   . Blood transfusion without reported diagnosis   . Bronchospasm with bronchitis, acute   . Depression   . Kidney stone   . Right carpal tunnel syndrome 12/09/2018  . SBO (small bowel obstruction) ([place]) 01/2014    Past Surgical History:  Procedure Laterality Date  . CESAREAN SECTION    . ENDOMETRIAL ABLATION    . GASTRIC BYPASS  2001  . SBO  2015    There were no vitals filed for this visit.  Subjective Assessment - 02/12/19 1534    Subjective  doing well today. Had trouble lifting a big thermos of coffee today.    Pertinent History  kidney stone, depression, bipolar, anxiety, anemia    Patient Stated Goals  return to work    Currently in Pain?  Yes    Pain Location  Elbow    Pain Orientation  Left    Pain Descriptors / Indicators  Sharp    Pain Type  Acute pain;Surgical pain                       [facility] Adult PT Treatment/Exercise - 02/12/19 0001      Shoulder Exercises: Standing   Other Standing Exercises  B UE farmer's carry 10# each UE  x135f   4/10 pain in L elbow; CGA and gait belt for safety     Shoulder Exercises: ROM/Strengthening   UBE (Upper Arm Bike)  L1.7 x 3 min forward/3 min backwards      Hand Exercises   MCPJ Extension  Strengthening;Left;10 reps    MCPJ Extension Limitations  all 5 fingers with white rubber band; 2x10      Wrist Exercises   Other wrist exercises  L wrist circumduction with elbow supported 15x each in pronation/supination with red medball   3-4/10 pain   Other wrist exercises  L wrist circumduction with elbow extended 15x each in pronation/supination with green ball   5-6/10            PT Education - 02/12/19 1608    Education Details  update to HEP    Person(s) Educated  Patient    Methods  Explanation;Demonstration;Tactile cues;Verbal cues;Handout    Comprehension  Verbalized understanding;Returned demonstration       PT Short Term Goals - 02/10/19 1451  PT SHORT TERM GOAL #1   Title  Independent w/ inital HEP     Time  3    Period  Weeks    Status  Achieved    Target Date  12/31/18        PT Long Term Goals - 02/10/19 1451      PT LONG TERM GOAL #1   Title  Independent w/ advanced HEP.    Time  4    Period  Weeks    Status  Partially Met   met for current   Target Date  03/10/19      PT LONG TERM GOAL #2   Title  Pt. will demonstrate an increase in Lt elbow and wrist AROM to WNL to increase function.    Time  4    Period  Weeks    Status  Partially Met   showing improvements in L elbow extension, wrist extension, and radial and ulnar deviation AROM   Target Date  03/10/19      PT LONG TERM GOAL #3   Title  Pt. will demonstrate increase in L elbow/wrist strength to 5/5 and Lt grip strength to >40 lbs to allow for increased elbow stability and functional use of arm.    Time  4    Period  Weeks    Status  On-going   showing improvement in L elbow flexion strength, grip strength still limited to 3.67 lbs   Target Date  03/10/19      PT LONG TERM  GOAL #4   Title  Patient to report 90% improvement in L elbow pain.    Time  4    Period  Weeks    Status  Partially Met   02/10/19: reports 70% improvement   Target Date  03/10/19      PT LONG TERM GOAL #5   Title  Pt will be able to perform her ususal ADL's and work duties with less than 2/10 overall pain.    Time  4    Period  Weeks    Status  On-going   Reports that she does not push herself past 3/10 pain at home before she stops the activity   Target Date  03/10/19            Plan - 02/12/19 1612    Clinical Impression Statement  Patient reporting continued difficulty with weighted gripping activities at home. Introduced wrist circumduction with increased size and weight of medball to increase challenge. Also worked on circumduction with elbow extended for greater challenge to elbow. Patient with greater difficulty with elbow extended. Patient extremely drowsy during sitting exercises and having difficulty maintaining eyes open- notes that she took Benadryl earlier today d/t an allergy attack. Worked on finger extension strength with patient reporting similar difficulty as when gripping 2 liter bottle overhead, thus updated HEP with this exercise. Patient reported understanding. Educated patient on use of massage, ice, and stretching to ease post-exercise tightness and soreness. Patient reported understanding and with no complaints at end of session.    Comorbidities  kidney stone, depression, bipolar, anxiety, anemia    Rehab Potential  Good    PT Frequency  2x / week    PT Duration  4 weeks    PT Treatment/Interventions  ADLs/Self Care Home Management;Cryotherapy;Electrical Stimulation;Moist Heat;Therapeutic exercise;Therapeutic activities;Ultrasound;Neuromuscular re-education;Patient/family education;Manual techniques;Vasopneumatic Device;Taping;Splinting;Energy conservation;Dry needling;Passive range of motion;Scar mobilization    PT Next Visit Plan  continue to simulate ADL  and work activities requiring full-handed  grasp    Consulted and Agree with Plan of Care  Patient       Patient will benefit from skilled therapeutic intervention in order to improve the following deficits and impairments:  Hypomobility, Increased edema, Decreased scar mobility, Decreased activity tolerance, Increased fascial restricitons, Pain, Impaired UE functional use, Improper body mechanics, Decreased range of motion, Impaired flexibility, Postural dysfunction  Visit Diagnosis: Pain in left elbow  Stiffness of left elbow, not elsewhere classified  Stiffness of left wrist, not elsewhere classified  Muscle weakness (generalized)     Problem List Patient Active Problem List   Diagnosis Date Noted  . Right carpal tunnel syndrome 12/09/2018  . Closed nondisplaced fracture of fifth left metatarsal bone 05/10/2018  . History of shingles 01/08/2018  . Menopause 01/08/2018  . Left ankle injury, subsequent encounter 08/04/2017  . PCP NOTES >>>>>>>>>> 07/11/2017  . 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 Chronic knee pain 04/18/2015  . Plantar fasciitis 04/18/2015  . Tobacco use disorder 03/15/2015  . Morbid obesity ([place]) 01/05/2015  . Bipolar affective ([place]) 01/02/2015     Janene Harvey, PT, DPT 02/12/19 4:15 PM   [facility] [address]  [place], [place], [address] Phone: [phone]   Fax:  [phone]  Name: TIMAYA BOJARSKI MRN: [id number] Date of Birth: [birth year]

## 2019-02-12 NOTE — Telephone Encounter (Signed)
Forms faxed to [employer] at [phone]. Forms sent for scanning.

## 2019-02-12 NOTE — Telephone Encounter (Signed)
Received fax confirmation

## 2019-02-17 ENCOUNTER — Other Ambulatory Visit: Payer: Self-pay

## 2019-02-17 ENCOUNTER — Encounter: Payer: Self-pay | Admitting: Neurology

## 2019-02-17 ENCOUNTER — Ambulatory Visit: Payer: No Typology Code available for payment source | Admitting: Neurology

## 2019-02-17 VITALS — BP 112/80 | HR 71 | Temp 97.1°F | Ht 64.0 in | Wt 258.0 lb

## 2019-02-17 DIAGNOSIS — R55 Syncope and collapse: Secondary | ICD-10-CM | POA: Diagnosis not present

## 2019-02-17 DIAGNOSIS — Z5181 Encounter for therapeutic drug level monitoring: Secondary | ICD-10-CM

## 2019-02-17 MED FILL — GABAPENTIN 300 MG CAPSULE: 300 | 30 days supply | Qty: 30 | Fill #1

## 2019-02-17 MED FILL — oxyCODONE HCL 5 MG TABS: 5 | 8 days supply | Qty: 8 | Fill #0

## 2019-02-17 NOTE — Patient Instructions (Signed)
Stop the gabapentin

## 2019-02-17 NOTE — Progress Notes (Signed)
Reason for visit: Syncope  Referring physician: Dr. Loleta RosePaz  Ashley Franklin is a [age] y.o. female  History of present illness:  Ashley Franklin is a 48 year old right-handed white female with a history of obesity, bipolar disorder, sleep apnea on CPAP, and a history of right carpal tunnel syndrome.  The patient just recently had surgery on 29 November 2018 for what sounds like a left lateral epicondylitis.  The patient indicates that over the last year she has had some problems with daytime drowsiness.  The patient has been followed through our office, she has been placed on CPAP.  Around the onset of the COVID virus, the patient was placed on diazepam, she apparently was started on 5 mg 3 times daily.  She has been on this dose of medication until 2 weeks ago when her dosing levels were cut back to 2.5 mg twice daily.  Over the last several months, the patient has had increasing problems with drowsiness, she is having difficulty staying awake during a meal or while driving a motor vehicle.  Her husband has witnessed several episodes where she will start nodding off, he will stimulate her and she will wake up only to fall asleep again.  The patient has lost bladder control on at least 2 events.  The patient has not had any stiffening or jerking, there has been some question of tremor at times in her hands.  The patient does not bite her tongue.  The patient feels spacey, she has had problems with word finding and has a "stupid feeling" that has paralleled the onset of the drowsiness.  The patient was also placed on gabapentin 300 mg at night in August 2020 for her carpal tunnel syndrome, she has right-sided mild carpal tunnel.  She comes to this office today for further evaluation.  She denies any numbness or weakness of the face, arms, legs.  She does report some mild gait instability, she has fallen in June, a CT scan of the brain at that time was done.  This was unremarkable.  She reports occasional headaches.   There have been no vision changes.  Past Medical History:  Diagnosis Date   Anemia    Anxiety    Bipolar affective disorder ([facility])    Blood transfusion without reported diagnosis    Bronchospasm with bronchitis, acute    Depression    Kidney stone    Right carpal tunnel syndrome 12/09/2018   SBO (small bowel obstruction) ([facility]) 01/2014    Past Surgical History:  Procedure Laterality Date   CESAREAN SECTION     ENDOMETRIAL ABLATION     GASTRIC BYPASS  2001   SBO  2015    Family History  Problem Relation Age of Onset   Diabetes Mother    Cancer Mother 8960       Throat    Bipolar disorder Mother    Bipolar disorder Sister    Bipolar disorder Brother    Drug abuse Brother    Bipolar disorder Sister    Colon cancer Neg Hx    Cancer - Prostate Neg Hx    Breast cancer Neg Hx     Social history:  reports that she has been smoking. She has a 1.50 pack-year smoking history. She has never used smokeless tobacco. She reports current alcohol use. She reports that she does not use drugs.  Medications:  Prior to Admission medications   Medication Sig Start Date End Date Taking? Authorizing Provider  diazepam (VALIUM) 5  MG tablet Take 0.5 tablets (2.5 mg total) by mouth 2 (two) times daily as needed for anxiety. 02/05/19  Yes [name], MD  divalproex (DEPAKOTE ER) 500 MG 24 hr tablet Take 4 tablets (2,000 mg total) by mouth at bedtime. 12/29/18  Yes Hisada, Barbee Cough, MD  FLUoxetine (PROZAC) 20 MG tablet Take 4 tablets (80 mg total) by mouth daily. 12/31/18  Yes [name], MD  gabapentin (NEURONTIN) 300 MG capsule TAKE 1 CAPSULE (300 MG TOTAL) BY MOUTH AT BEDTIME. 12/31/18  Yes York Spaniel, MD  lamoTRIgine (LAMICTAL) 25 MG tablet Take 2 tablets (50 mg total) by mouth daily. 12/31/18  Yes [name], MD      Allergies  Allergen Reactions   Nsaids Other (See Comments)    Contraindicated with gastric bypass    ROS:  Out of a complete 14 system  review of symptoms, the patient complains only of the following symptoms, and all other reviewed systems are negative.  Drowsiness Gait instability Decreased cognitive function  Blood pressure 112/80, pulse 71, temperature (!) 97.1 F (36.2 C), height 5\' 4"  (1.626 m), weight 258 lb (117 kg).  Physical Exam  General: The patient is alert and cooperative at the time of the examination.  The patient is morbidly obese.  Eyes: Pupils are equal, round, and reactive to light. Discs are flat bilaterally.  Neck: The neck is supple, no carotid bruits are noted.  Respiratory: The respiratory examination is clear.  Cardiovascular: The cardiovascular examination reveals a regular rate and rhythm, no obvious murmurs or rubs are noted.  Skin: Extremities are without significant edema.  Neurologic Exam  Mental status: The patient is alert and oriented x 3 at the time of the examination. The patient has apparent normal recent and remote memory, with an apparently normal attention span and concentration ability.  Cranial nerves: Facial symmetry is present. There is good sensation of the face to pinprick and soft touch bilaterally. The strength of the facial muscles and the muscles to head turning and shoulder shrug are normal bilaterally. Speech is well enunciated, no aphasia or dysarthria is noted. Extraocular movements are full. Visual fields are full. The tongue is midline, and the patient has symmetric elevation of the soft palate. No obvious hearing deficits are noted.  Motor: The motor testing reveals 5 over 5 strength of all 4 extremities. Good symmetric motor tone is noted throughout.  Sensory: Sensory testing is intact to pinprick, soft touch, vibration sensation, and position sense on all 4 extremities. No evidence of extinction is noted.  Coordination: Cerebellar testing reveals good finger-nose-finger and heel-to-shin bilaterally.  Gait and station: Gait is normal. Tandem gait is normal.  Romberg is negative. No drift is seen.  Reflexes: Deep tendon reflexes are symmetric and normal bilaterally. Toes are downgoing bilaterally.   CT head 10/22/18:  IMPRESSION: Mild atrophic changes without acute abnormality.  * CT scan images were reviewed online. I agree with the written report.   Assessment/Plan:  1.  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  Sleep apnea on CPAP  3.  Bipolar disorder  4.  Right carpal tunnel syndrome  The patient gives a history that is most consistent with toxicity to diazepam.  The patient was on 15 mg of diazepam daily, but this medication has very long active metabolites that can produce toxicity over time.  Appropriately, the dose was significantly reduced and this should elicit an improvement in her symptoms.  If not, the patient may require further evaluation to exclude another type  of sleep disorder such as narcolepsy.  I do not believe the patient is having true syncope or seizures.  Having said this, an EEG study will be done.  Blood work will be done to include an ammonia level, Lamictal levels were done recently, and were in the therapeutic range at 71.  The patient will discontinue the gabapentin.  If she does not improve with the dose reduction of diazepam, she will contact our office and we will set up a more thorough sleep evaluation.  Jill Alexanders MD 02/17/2019 7:28 AM  [facility] [address] [place], [place] [address]  Phone [phone] Fax [phone]

## 2019-02-18 ENCOUNTER — Ambulatory Visit: Payer: PRIVATE HEALTH INSURANCE | Attending: Orthopedic Surgery | Admitting: Physical Therapy

## 2019-02-18 ENCOUNTER — Other Ambulatory Visit: Payer: Self-pay

## 2019-02-18 ENCOUNTER — Encounter: Payer: Self-pay | Admitting: Physical Therapy

## 2019-02-18 DIAGNOSIS — M25522 Pain in left elbow: Secondary | ICD-10-CM | POA: Diagnosis not present

## 2019-02-18 DIAGNOSIS — M6281 Muscle weakness (generalized): Secondary | ICD-10-CM | POA: Diagnosis present

## 2019-02-18 DIAGNOSIS — M25622 Stiffness of left elbow, not elsewhere classified: Secondary | ICD-10-CM | POA: Diagnosis present

## 2019-02-18 DIAGNOSIS — M25632 Stiffness of left wrist, not elsewhere classified: Secondary | ICD-10-CM | POA: Insufficient documentation

## 2019-02-18 LAB — AMMONIA: Ammonia: 33 ug/dL (ref 31–155)

## 2019-02-18 LAB — LAMOTRIGINE LEVEL: Lamotrigine Lvl: 1 ug/mL — ABNORMAL LOW (ref 2.0–20.0)

## 2019-02-18 NOTE — Therapy (Signed)
[facility] [address]  [place], [place], [address] Phone: [phone]   Fax:  [phone]  Physical Therapy Treatment  Patient Details  Name: Ashley Franklin MRN: [id number] Date of Birth: 1970/08/12 Referring Provider (PT): Milly Jakob, MD   Encounter Date: 02/18/2019  PT End of Session - 02/18/19 1529    Visit Number  19    Number of Visits  25    Date for PT Re-Evaluation  03/10/19    Authorization - Visit Number  76    Authorization - Number of Visits  25    PT Start Time  1552    PT Stop Time  1620    PT Time Calculation (min)  53 min    Activity Tolerance  Patient tolerated treatment well;Patient limited by pain    Behavior During Therapy  [facility] for tasks assessed/performed       Past Medical History:  Diagnosis Date  . Anemia   . Anxiety   . Bipolar affective disorder ([place])   . Blood transfusion without reported diagnosis   . Bronchospasm with bronchitis, acute   . Depression   . Kidney stone   . Right carpal tunnel syndrome 12/09/2018  . SBO (small bowel obstruction) ([place]) 01/2014    Past Surgical History:  Procedure Laterality Date  . CESAREAN SECTION    . ENDOMETRIAL ABLATION    . GASTRIC BYPASS  2001  . SBO  2015    There were no vitals filed for this visit.  Subjective Assessment - 02/18/19 1522    Subjective  Pt reporting that she is doing well today. Pt stating that she is still having some difficulty with lifting objects around her home.    Pertinent History  kidney stone, depression, bipolar, anxiety, anemia    Limitations  Lifting;House hold activities    Patient Stated Goals  return to work    Currently in Pain?  Yes    Pain Score  2     Pain Location  Elbow    Pain Orientation  Left    Pain Descriptors / Indicators  Aching    Pain Type  Surgical pain;Acute pain    Pain Onset  More than a month ago    Pain Frequency  Intermittent                        [facility] Adult PT Treatment/Exercise - 02/18/19 0001      Shoulder Exercises: Standing   Other Standing Exercises  reaching to stool height to lift 2 towels and 2# weight to table height (simulation of doing laundry) x 6 times and then lowering it back to stool height.       Shoulder Exercises: ROM/Strengthening   UBE (Upper Arm Bike)  L1.7 x 3 min forward/3 min backwards      Hand Exercises   MCPJ Extension  Strengthening;Left;10 reps    MCPJ Extension Limitations  all 5 fingers with white rubber band; 2x10    Other Hand Exercises  theraband colored clips on rod, alternating colors and reaching x 12 clips, (3 yellow, 3 red, 3 green, 2 blue and 1 black)    Other Hand Exercises  hammer with ulnar and radial deviation      Wrist Exercises   Wrist Extension  AROM;Strengthening;Left;10 reps;Seated    Theraband Level (Wrist Extension)  Level 1 (Yellow)    Other wrist exercises  pronation/supination with hammer x 10  reps    Other wrist exercises  L wrist circumduction with elbow extended 5x each in pronation/supination with pt's therapy ball   5-6/10     Modalities   Modalities  Moist Heat      Moist Heat Therapy   Number Minutes Moist Heat  10 Minutes    Moist Heat Location  Elbow               PT Short Term Goals - 02/10/19 1451      PT SHORT TERM GOAL #1   Title  Independent w/ inital HEP     Time  3    Period  Weeks    Status  Achieved    Target Date  12/31/18        PT Long Term Goals - 02/18/19 1630      PT LONG TERM GOAL #1   Title  Independent w/ advanced HEP.    Time  4    Period  Weeks    Status  Partially Met      PT LONG TERM GOAL #2   Title  Pt. will demonstrate an increase in Lt elbow and wrist AROM to WNL to increase function.    Time  4    Period  Weeks    Status  Partially Met      PT LONG TERM GOAL #3   Title  Pt. will demonstrate increase in L elbow/wrist strength to 5/5 and Lt grip strength to >40 lbs to  allow for increased elbow stability and functional use of arm.    Period  Weeks    Status  On-going      PT LONG TERM GOAL #4   Title  Patient to report 90% improvement in L elbow pain.    Time  4    Period  Weeks    Status  Partially Met      PT LONG TERM GOAL #5   Title  Pt will be able to perform her ususal ADL's and work duties with less than 2/10 overall pain.    Time  4    Period  Weeks    Status  On-going            Plan - 02/18/19 1530    Clinical Impression Statement  Patient reporting 2/10 pain upon arrival. Pt still reporting difficulty with lifting and reaching for objects around her home. Pt reporting her "grasp is off". We continued to progress with finger extension and elbow extension exercises to improve functioanl mobility. Pt stating she is icing at home and performing soft tissue massage intermittently during pain episodes.Pt reporting increased pain when performing fine motor skills using the "nut and bolt" box. Pt reporting pain increased from 2/10 to 4-5/10.  Pt tolerating session well and reported pain had went back down to 2/10 by end of session. Continue with skilled PT.    Personal Factors and Comorbidities  Comorbidity 3+;Fitness;Past/Current Experience;Profession;Time since onset of injury/illness/exacerbation    Comorbidities  kidney stone, depression, bipolar, anxiety, anemia    Examination-Activity Limitations  Caring for Others;Carry;Hygiene/Grooming;Lift    Examination-Participation Restrictions  Cleaning;Shop;Driving;Yard Work;Interpersonal Relationship;Laundry;Meal Prep    Stability/Clinical Decision Making  Stable/Uncomplicated    Rehab Potential  Good    PT Frequency  2x / week    PT Treatment/Interventions  ADLs/Self Care Home Management;Cryotherapy;Electrical Stimulation;Moist Heat;Therapeutic exercise;Therapeutic activities;Ultrasound;Neuromuscular re-education;Patient/family education;Manual techniques;Vasopneumatic  Device;Taping;Splinting;Energy conservation;Dry needling;Passive range of motion;Scar mobilization    PT Next Visit Plan  continue to simulate  ADL and work activities requiring full-handed grasp    PT Home Exercise Plan  lifting, reaching, instructed in taking jar tops or spice jar tops on and off    Consulted and Agree with Plan of Care  Patient       Patient will benefit from skilled therapeutic intervention in order to improve the following deficits and impairments:  Hypomobility, Increased edema, Decreased scar mobility, Decreased activity tolerance, Increased fascial restricitons, Pain, Impaired UE functional use, Improper body mechanics, Decreased range of motion, Impaired flexibility, Postural dysfunction  Visit Diagnosis: Pain in left elbow  Stiffness of left elbow, not elsewhere classified  Muscle weakness (generalized)  Stiffness of left wrist, not elsewhere classified     Problem List Patient Active Problem List   Diagnosis Date Noted  . Right carpal tunnel syndrome 12/09/2018  . Closed nondisplaced fracture of fifth left metatarsal bone 05/10/2018  . History of shingles 01/08/2018  . Menopause 01/08/2018  . Left ankle injury, subsequent encounter 08/04/2017  . PCP NOTES >>>>>>>>>> 07/11/2017  . 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 Chronic knee pain 04/18/2015  . Plantar fasciitis 04/18/2015  . Tobacco use disorder 03/15/2015  . Morbid obesity ([place]) 01/05/2015  . Bipolar affective ([place]) 01/02/2015    Oretha Caprice, MPT 02/18/2019, 4:38 PM  [facility] [address]  [place], [place], [address] Phone: [phone]   Fax:  [phone]  Name: Ashley Franklin MRN: [id number] Date of Birth: [birth year]

## 2019-02-19 ENCOUNTER — Encounter: Payer: Self-pay | Admitting: Physical Therapy

## 2019-02-19 ENCOUNTER — Other Ambulatory Visit: Payer: Self-pay

## 2019-02-19 ENCOUNTER — Ambulatory Visit: Payer: PRIVATE HEALTH INSURANCE | Admitting: Physical Therapy

## 2019-02-19 DIAGNOSIS — M25622 Stiffness of left elbow, not elsewhere classified: Secondary | ICD-10-CM

## 2019-02-19 DIAGNOSIS — M25632 Stiffness of left wrist, not elsewhere classified: Secondary | ICD-10-CM

## 2019-02-19 DIAGNOSIS — M6281 Muscle weakness (generalized): Secondary | ICD-10-CM

## 2019-02-19 DIAGNOSIS — M25522 Pain in left elbow: Secondary | ICD-10-CM | POA: Diagnosis not present

## 2019-02-19 NOTE — Therapy (Signed)
[facility] [address]  [place], [place], [address] Phone: [phone]   Fax:  [phone]  Physical Therapy Treatment Progress note  Patient Details  Name: Ashley Franklin MRN: [id number] Date of Birth: 09/14/1970 Referring Provider (PT): Milly Jakob, MD   Encounter Date: 02/19/2019  PT End of Session - 02/19/19 1455    Visit Number  20    Number of Visits  25    Date for PT Re-Evaluation  03/10/19    Authorization Type  WC: 1 eval + 8 tx + 8 tx +8 tx    Authorization Time Period  progress note sent on 02/19/2019 (20th visit)    Authorization - Visit Number  15    Authorization - Number of Visits  25    PT Start Time  4315    PT Stop Time  1540    PT Time Calculation (min)  55 min    Activity Tolerance  Patient tolerated treatment well;Patient limited by pain    Behavior During Therapy  [facility] for tasks assessed/performed       Past Medical History:  Diagnosis Date  . Anemia   . Anxiety   . Bipolar affective disorder ([place])   . Blood transfusion without reported diagnosis   . Bronchospasm with bronchitis, acute   . Depression   . Kidney stone   . Right carpal tunnel syndrome 12/09/2018  . SBO (small bowel obstruction) ([place]) 01/2014    Past Surgical History:  Procedure Laterality Date  . CESAREAN SECTION    . ENDOMETRIAL ABLATION    . GASTRIC BYPASS  2001  . SBO  2015    There were no vitals filed for this visit.  Subjective Assessment - 02/19/19 1452    Subjective  Pt arriving to therapy reporting a little soreness after yesterday's session.    Pertinent History  kidney stone, depression, bipolar, anxiety, anemia    Limitations  Lifting;House hold activities    Currently in Pain?  Yes    Pain Score  1     Pain Location  Elbow    Pain Orientation  Left    Pain Descriptors / Indicators  Aching    Pain Onset  More than a month ago         [facility] PT Assessment - 02/19/19 0001      Assessment   Medical Diagnosis  s/p L lateral epicondylitis debridement    Referring Provider (PT)  Milly Jakob, MD    Onset Date/Surgical Date  11/29/18      Observation/Other Assessments   Focus on Therapeutic Outcomes (FOTO)   53% limitation      AROM   Left Elbow Flexion  142    Left Elbow Extension  0    Left Wrist Extension  82 Degrees    Left Wrist Flexion  90 Degrees    Left Wrist Radial Deviation  25 Degrees    Left Wrist Ulnar Deviation  45 Degrees      Strength   Left Elbow Flexion  4+/5    Left Elbow Extension  4/5    Left Wrist Flexion  4+/5    Left Wrist Extension  4/5    Left Wrist Radial Deviation  4+/5    Left Wrist Ulnar Deviation  4+/5                   [facility] Adult PT Treatment/Exercise - 02/19/19 0001  Hand Exercises   MCPJ Extension  Strengthening;Left;10 reps    MCPJ Extension Limitations  all 5 fingers with white rubber band; 2x10    Other Hand Exercises  theraband colored clips on rod, alternating colors and reaching x 12 clips, (  3 red, 3 green, 3 blue and 3 black)      Wrist Exercises   Other wrist exercises  lifting 3 and 4 pound weight from floor to table height while sitting 5 reps with each     Other wrist exercises  L wrist circumduction with elbow extended 5x each in pronation/supination with pt's therapy ball   5-6/10     Modalities   Modalities  Moist Heat      Moist Heat Therapy   Number Minutes Moist Heat  10 Minutes    Moist Heat Location  Elbow             PT Education - 02/19/19 1454    Education Details  functional exercises for HEP, including laudry lifting, opening jars and tops    Person(s) Educated  Patient    Methods  Explanation;Demonstration    Comprehension  Verbalized understanding;Returned demonstration       PT Short Term Goals - 02/19/19 1500      PT SHORT TERM GOAL #1   Title  Independent w/ inital HEP     Time  3    Period  Weeks    Status  Achieved        PT Long Term  Goals - 02/19/19 1500      PT LONG TERM GOAL #1   Title  Independent w/ advanced HEP.    Time  4    Period  Weeks    Status  Partially Met      PT LONG TERM GOAL #2   Title  Pt. will demonstrate an increase in Lt elbow and wrist AROM to WNL to increase function.    Time  4    Period  Weeks    Status  Partially Met      PT LONG TERM GOAL #3   Title  Pt. will demonstrate increase in L elbow/wrist strength to 5/5 and Lt grip strength to >40 lbs to allow for increased elbow stability and functional use of arm.    Baseline  L grip with elbow at 90 degrees: 25ppsi, L grip with elbow extended: 22.5 ppsi (average of 3 trials), MMT: grossly 4/5    Time  4    Period  Weeks    Status  Partially Met      PT LONG TERM GOAL #4   Title  Patient to report 90% improvement in L elbow pain.    Baseline  Pt reporting ability to do perform ADL's with tolerable pain depending on activity. Pt feels she has definitely improved since beginning therapy.    Time  4    Period  Weeks    Status  Partially Met      PT LONG TERM GOAL #5   Title  Pt will be able to perform her ususal ADL's and work duties with less than 2/10 overall pain.    Baseline  Pt reporting that most days she is able to perform her ALD's with 2/10 pain but some days are worse.    Time  4    Period  Weeks    Status  Partially Met            Plan - 02/19/19 1713  Clinical Impression Statement  Pt arriving today with 1/10 left elbow pain. Pt still reporting difficulty with household responsibilities such as laundry, grocery loading and unloading and reaching into her cabinets. Pt also reporting decreased precision on grasping with occassional dropping of objects. Pt has improved in ulnar and radial deviation by 5 degrees since last assessment. Pt's elbow ROM has improved to 0-142 degres. Wrist flexion 90 degrees and wrist extension is 82 degrees. Pain reported during therapy session of 4/10. Pt has currently met her STG's and  partially met her LTG's. Continue with skilled PT and progress toward pt's PLOF.    Personal Factors and Comorbidities  Comorbidity 3+;Fitness;Past/Current Experience;Profession;Time since onset of injury/illness/exacerbation    Comorbidities  kidney stone, depression, bipolar, anxiety, anemia    Examination-Activity Limitations  Caring for Others;Carry;Hygiene/Grooming;Lift    Examination-Participation Restrictions  Cleaning;Shop;Driving;Yard Work;Interpersonal Relationship;Laundry;Meal Prep    Stability/Clinical Decision Making  Stable/Uncomplicated    Rehab Potential  Good    PT Frequency  2x / week    PT Duration  4 weeks    PT Treatment/Interventions  ADLs/Self Care Home Management;Cryotherapy;Electrical Stimulation;Moist Heat;Therapeutic exercise;Therapeutic activities;Ultrasound;Neuromuscular re-education;Patient/family education;Manual techniques;Vasopneumatic Device;Taping;Splinting;Energy conservation;Dry needling;Passive range of motion;Scar mobilization    PT Next Visit Plan  continue to simulate ADL and work activities requiring full-handed grasp    PT Home Exercise Plan  lifting, reaching, instructed in taking jar tops or spice jar tops on and off    Consulted and Agree with Plan of Care  Patient       Patient will benefit from skilled therapeutic intervention in order to improve the following deficits and impairments:  Hypomobility, Increased edema, Decreased scar mobility, Decreased activity tolerance, Increased fascial restricitons, Pain, Impaired UE functional use, Improper body mechanics, Decreased range of motion, Impaired flexibility, Postural dysfunction  Visit Diagnosis: Pain in left elbow  Stiffness of left elbow, not elsewhere classified  Muscle weakness (generalized)  Stiffness of left wrist, not elsewhere classified     Problem List Patient Active Problem List   Diagnosis Date Noted  . Right carpal tunnel syndrome 12/09/2018  . Closed nondisplaced fracture  of fifth left metatarsal bone 05/10/2018  . History of shingles 01/08/2018  . Menopause 01/08/2018  . Left ankle injury, subsequent encounter 08/04/2017  . PCP NOTES >>>>>>>>>> 07/11/2017  . 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 Chronic knee pain 04/18/2015  . Plantar fasciitis 04/18/2015  . Tobacco use disorder 03/15/2015  . Morbid obesity ([place]) 01/05/2015  . Bipolar affective ([place]) 01/02/2015    Oretha Caprice, PT 02/19/2019, 5:19 PM  [facility] [address]  [place], [place], [address] Phone: [phone]   Fax:  [phone]  Name: Ashley Franklin MRN: [id number] Date of Birth: [birth year]

## 2019-02-21 ENCOUNTER — Other Ambulatory Visit: Payer: Self-pay

## 2019-02-21 ENCOUNTER — Encounter (HOSPITAL_COMMUNITY): Payer: Self-pay | Admitting: Psychiatry

## 2019-02-21 ENCOUNTER — Ambulatory Visit (INDEPENDENT_AMBULATORY_CARE_PROVIDER_SITE_OTHER): Payer: No Typology Code available for payment source | Admitting: Psychiatry

## 2019-02-21 DIAGNOSIS — R400 Somnolence: Secondary | ICD-10-CM | POA: Diagnosis not present

## 2019-02-21 DIAGNOSIS — F3131 Bipolar disorder, current episode depressed, mild: Secondary | ICD-10-CM | POA: Diagnosis not present

## 2019-02-21 MED ORDER — LAMOTRIGINE 25 MG PO TABS: 50.0000 mg | ORAL_TABLET | Freq: Every day | ORAL | 1 refills | Status: DC

## 2019-02-21 MED ORDER — DIVALPROEX SODIUM ER 500 MG PO TB24: 2000.0000 mg | ORAL_TABLET | Freq: Every day | ORAL | 1 refills | Status: DC

## 2019-02-21 MED ORDER — LURASIDONE HCL 60 MG PO TABS: 60.0000 mg | ORAL_TABLET | Freq: Every day | ORAL | 1 refills | Status: DC

## 2019-02-21 MED ORDER — FLUOXETINE HCL 20 MG PO TABS: 80.0000 mg | ORAL_TABLET | Freq: Every day | ORAL | 1 refills | Status: DC

## 2019-02-21 NOTE — Patient Instructions (Signed)
1.ContinueDepakote 2000 mg at night  3. Continue fluoxetine 80 mg daily  4.Continuelamotrigine50mg  daily 5.Continuelatuda 60 mg daily 6. Decrease Valium 2.5 mg daily for one week, then discontinue  8. Next appointment:in December

## 2019-02-24 ENCOUNTER — Other Ambulatory Visit: Payer: Self-pay

## 2019-02-24 ENCOUNTER — Ambulatory Visit: Payer: PRIVATE HEALTH INSURANCE

## 2019-02-24 DIAGNOSIS — M25522 Pain in left elbow: Secondary | ICD-10-CM

## 2019-02-24 DIAGNOSIS — M6281 Muscle weakness (generalized): Secondary | ICD-10-CM

## 2019-02-24 DIAGNOSIS — M25622 Stiffness of left elbow, not elsewhere classified: Secondary | ICD-10-CM

## 2019-02-24 DIAGNOSIS — M25632 Stiffness of left wrist, not elsewhere classified: Secondary | ICD-10-CM

## 2019-02-24 NOTE — Therapy (Signed)
[facility] [address]  [place], [place], [address] Phone: [phone]   Fax:  [phone]  Physical Therapy Treatment  Patient Details  Name: Ashley Franklin MRN: [id number] Date of Birth: [birth year] Referring Provider (PT): Milly Jakob, MD   Encounter Date: 02/24/2019  PT End of Session - 02/24/19 1631    Visit Number  21    Number of Visits  25    Date for PT Re-Evaluation  03/10/19    Authorization Type  WC: 1 eval + 8 tx + 8 tx +8 tx    Authorization Time Period  progress note sent on 02/19/2019 (20th visit)    Authorization - Visit Number  21    Authorization - Number of Visits  25    PT Start Time  8115    PT Stop Time  1702    PT Time Calculation (min)  41 min    Activity Tolerance  Patient tolerated treatment well;Patient limited by pain    Behavior During Therapy  [facility] for tasks assessed/performed       Past Medical History:  Diagnosis Date  . Anemia   . Anxiety   . Bipolar affective disorder ([place])   . Blood transfusion without reported diagnosis   . Bronchospasm with bronchitis, acute   . Depression   . Kidney stone   . Right carpal tunnel syndrome 12/09/2018  . SBO (small bowel obstruction) ([place]) 01/2014    Past Surgical History:  Procedure Laterality Date  . CESAREAN SECTION    . ENDOMETRIAL ABLATION    . GASTRIC BYPASS  2001  . SBO  2015    There were no vitals filed for this visit.  Subjective Assessment - 02/24/19 1629    Subjective  Pt. doing well today.  Notes, "my fine motor skills are what we worked on the last few visits".    Pertinent History  kidney stone, depression, bipolar, anxiety, anemia    Patient Stated Goals  return to work    Currently in Pain?  No/denies    Pain Score  0-No pain   pt. noting 3/10 pain at worst with lifting pots and pans   Pain Location  Elbow    Pain Orientation  Left    Pain Descriptors / Indicators  Aching    Pain Type  Surgical  pain;Acute pain    Pain Onset  More than a month ago    Pain Frequency  Intermittent    Aggravating Factors   picking up a pot or pan out of ovan    Multiple Pain Sites  No                       [facility] Adult PT Treatment/Exercise - 02/24/19 0001      Self-Care   Self-Care  Other Self-Care Comments    Other Self-Care Comments   Simulated using tourniquet (with thin blue TB) wrapping rolled towel (simulating pt. arm) x 5 reps - 5/10       Elbow Exercises   Forearm Supination  Left;AROM;15 reps    Forearm Supination Limitations  with hammer at end of grip     Forearm Pronation  Left;15 reps;AROM    Forearm Pronation Limitations  with hammer at end grip       Shoulder Exercises: ROM/Strengthening   Nustep  Lvl 3, 7 min (UE/LE)      Hand Exercises   Other Hand Exercises  theraband  colored clips on rod, alternating colors and reaching 2 x 12 clips, (  3 red, 3 green, 3 blue and 3 black)      Wrist Exercises   Wrist Radial Deviation  Left;10 reps;Seated    Wrist Radial Deviation Limitations  hammer end grip     Other wrist exercises  L wrist extension, flexion stretches 2 x 30 sec each way                PT Short Term Goals - 02/19/19 1500      PT SHORT TERM GOAL #1   Title  Independent w/ inital HEP     Time  3    Period  Weeks    Status  Achieved        PT Long Term Goals - 02/19/19 1500      PT LONG TERM GOAL #1   Title  Independent w/ advanced HEP.    Time  4    Period  Weeks    Status  Partially Met      PT LONG TERM GOAL #2   Title  Pt. will demonstrate an increase in Lt elbow and wrist AROM to WNL to increase function.    Time  4    Period  Weeks    Status  Partially Met      PT LONG TERM GOAL #3   Title  Pt. will demonstrate increase in L elbow/wrist strength to 5/5 and Lt grip strength to >40 lbs to allow for increased elbow stability and functional use of arm.    Baseline  L grip with elbow at 90 degrees: 25ppsi, L grip with elbow  extended: 22.5 ppsi (average of 3 trials), MMT: grossly 4/5    Time  4    Period  Weeks    Status  Partially Met      PT LONG TERM GOAL #4   Title  Patient to report 90% improvement in L elbow pain.    Baseline  Pt reporting ability to do perform ADL's with tolerable pain depending on activity. Pt feels she has definitely improved since beginning therapy.    Time  4    Period  Weeks    Status  Partially Met      PT LONG TERM GOAL #5   Title  Pt will be able to perform her ususal ADL's and work duties with less than 2/10 overall pain.    Baseline  Pt reporting that most days she is able to perform her ALD's with 2/10 pain but some days are worse.    Time  4    Period  Weeks    Status  Partially Met            Plan - 02/24/19 1646    Clinical Impression Statement  Pt. doing well today.  Session focused on wrist, forearm, hand strengthening activities with practice using B UE to wrap tourniquet simulating job related task.  Pt. demonstrating remaining weakness in L hand 3-point pinch grip positioning while wrapping tourniquet today with L elbow pain rising to 5/10 which recovered with rest.  Pt. reports she needs to perform "tourniquet wrapping" task up to 50x/day.  Does seem to be progressing with strengthening activities per protocol however will likely benefit from further skilled therapy to improve functional strength with work-related tasks.    Personal Factors and Comorbidities  Comorbidity 3+;Fitness;Past/Current Experience;Profession;Time since onset of injury/illness/exacerbation    Comorbidities  kidney stone, depression, bipolar, anxiety, anemia  Rehab Potential  Good    PT Treatment/Interventions  ADLs/Self Care Home Management;Cryotherapy;Electrical Stimulation;Moist Heat;Therapeutic exercise;Therapeutic activities;Ultrasound;Neuromuscular re-education;Patient/family education;Manual techniques;Vasopneumatic Device;Taping;Splinting;Energy conservation;Dry needling;Passive  range of motion;Scar mobilization    PT Next Visit Plan  continue to simulate ADL and work activities requiring full-handed grasp    PT Home Exercise Plan  lifting, reaching, instructed in taking jar tops or spice jar tops on and off    Consulted and Agree with Plan of Care  Patient       Patient will benefit from skilled therapeutic intervention in order to improve the following deficits and impairments:  Hypomobility, Increased edema, Decreased scar mobility, Decreased activity tolerance, Increased fascial restricitons, Pain, Impaired UE functional use, Improper body mechanics, Decreased range of motion, Impaired flexibility, Postural dysfunction  Visit Diagnosis: Pain in left elbow  Stiffness of left elbow, not elsewhere classified  Stiffness of left wrist, not elsewhere classified  Muscle weakness (generalized)     Problem List Patient Active Problem List   Diagnosis Date Noted  . Right carpal tunnel syndrome 12/09/2018  . Closed nondisplaced fracture of fifth left metatarsal bone 05/10/2018  . History of shingles 01/08/2018  . Menopause 01/08/2018  . Left ankle injury, subsequent encounter 08/04/2017  . PCP NOTES >>>>>>>>>> 07/11/2017  . 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 Chronic knee pain 04/18/2015  . Plantar fasciitis 04/18/2015  . Tobacco use disorder 03/15/2015  . Morbid obesity ([place]) 01/05/2015  . Bipolar affective ([place]) 01/02/2015    Bess Harvest, PTA 02/24/19 5:41 PM   [facility] [address]  [place], [place], [address] Phone: [phone]   Fax:  [phone]  Name: Ashley Franklin MRN: [id number] Date of Birth: [birth year]

## 2019-02-25 ENCOUNTER — Ambulatory Visit: Payer: No Typology Code available for payment source | Admitting: Psychology

## 2019-02-26 ENCOUNTER — Encounter: Payer: Self-pay | Admitting: Physical Therapy

## 2019-02-26 ENCOUNTER — Other Ambulatory Visit: Payer: Self-pay

## 2019-02-26 ENCOUNTER — Ambulatory Visit: Payer: PRIVATE HEALTH INSURANCE | Admitting: Physical Therapy

## 2019-02-26 DIAGNOSIS — M25622 Stiffness of left elbow, not elsewhere classified: Secondary | ICD-10-CM

## 2019-02-26 DIAGNOSIS — M6281 Muscle weakness (generalized): Secondary | ICD-10-CM

## 2019-02-26 DIAGNOSIS — M25632 Stiffness of left wrist, not elsewhere classified: Secondary | ICD-10-CM

## 2019-02-26 DIAGNOSIS — M25522 Pain in left elbow: Secondary | ICD-10-CM

## 2019-02-26 NOTE — Therapy (Signed)
[facility] [address]  [place], [place], [address] Phone: [phone]   Fax:  [phone]  Physical Therapy Treatment  Patient Details  Name: Ashley Franklin MRN: [id number] Date of Birth: [birth year] Referring Provider (PT): Milly Jakob, MD   Encounter Date: 02/26/2019  PT End of Session - 02/26/19 1650    Visit Number  22    Number of Visits  25    Date for PT Re-Evaluation  03/10/19    Authorization Type  WC: 1 eval + 8 tx + 8 tx +8 tx    Authorization Time Period  progress note sent on 02/19/2019 (20th visit)    Authorization - Visit Number  2    Authorization - Number of Visits  25    PT Start Time  1608    PT Stop Time  1649    PT Time Calculation (min)  41 min    Activity Tolerance  Patient tolerated treatment well;Patient limited by pain    Behavior During Therapy  [facility] for tasks assessed/performed       Past Medical History:  Diagnosis Date  . Anemia   . Anxiety   . Bipolar affective disorder ([place])   . Blood transfusion without reported diagnosis   . Bronchospasm with bronchitis, acute   . Depression   . Kidney stone   . Right carpal tunnel syndrome 12/09/2018  . SBO (small bowel obstruction) ([place]) 01/2014    Past Surgical History:  Procedure Laterality Date  . CESAREAN SECTION    . ENDOMETRIAL ABLATION    . GASTRIC BYPASS  2001  . SBO  2015    There were no vitals filed for this visit.  Subjective Assessment - 02/26/19 1609    Subjective  Reports that everything is going well. Had some lasting pain after last session- feels she may have overdone one of her exercises during session on Monday.    Pertinent History  kidney stone, depression, bipolar, anxiety, anemia    Patient Stated Goals  return to work    Currently in Pain?  No/denies                       [facility] Adult PT Treatment/Exercise - 02/26/19 0001      Shoulder Exercises: Standing   External Rotation   Strengthening;Left;10 reps;Theraband    Theraband Level (Shoulder External Rotation)  Level 1 (Yellow)    External Rotation Limitations  elbow at side; good control    Internal Rotation  Strengthening;Left;10 reps;Theraband    Theraband Level (Shoulder Internal Rotation)  Level 1 (Yellow)    Internal Rotation Limitations  elbow at side; good control    Other Standing Exercises  lifting 4# weight from step stool to file box on counter top to simulate lifting groceries 2x10   cues for proper lifting mechanics     Shoulder Exercises: ROM/Strengthening   UBE (Upper Arm Bike)  L2.0 x 3 min forward/3 min backwards      Wrist Exercises   Wrist Flexion  Strengthening;Both;Standing;15 reps    Wrist Flexion Limitations  standing B wrist flexion with long dowel rod     Wrist Radial Deviation  Strengthening;Left;10 reps;Standing    Wrist Radial Deviation Limitations  mid grip on long rod    Wrist Ulnar Deviation  Strengthening;Left;10 reps;Standing    Wrist Ulnar Deviation Limitations  mid grip on long rod    Other wrist exercises  L wrist extension,  flexion stretches 2 x 30 sec each way              PT Education - 02/26/19 1650    Education Details  update to HEP; advised to try grocery bag lift from floor to counter with ~4# object    Person(s) Educated  Patient    Methods  Explanation;Demonstration;Tactile cues;Verbal cues;Handout    Comprehension  Verbalized understanding;Returned demonstration       PT Short Term Goals - 02/19/19 1500      PT SHORT TERM GOAL #1   Title  Independent w/ inital HEP     Time  3    Period  Weeks    Status  Achieved        PT Long Term Goals - 02/19/19 1500      PT LONG TERM GOAL #1   Title  Independent w/ advanced HEP.    Time  4    Period  Weeks    Status  Partially Met      PT LONG TERM GOAL #2   Title  Pt. will demonstrate an increase in Lt elbow and wrist AROM to WNL to increase function.    Time  4    Period  Weeks    Status   Partially Met      PT LONG TERM GOAL #3   Title  Pt. will demonstrate increase in L elbow/wrist strength to 5/5 and Lt grip strength to >40 lbs to allow for increased elbow stability and functional use of arm.    Baseline  L grip with elbow at 90 degrees: 25ppsi, L grip with elbow extended: 22.5 ppsi (average of 3 trials), MMT: grossly 4/5    Time  4    Period  Weeks    Status  Partially Met      PT LONG TERM GOAL #4   Title  Patient to report 90% improvement in L elbow pain.    Baseline  Pt reporting ability to do perform ADL's with tolerable pain depending on activity. Pt feels she has definitely improved since beginning therapy.    Time  4    Period  Weeks    Status  Partially Met      PT LONG TERM GOAL #5   Title  Pt will be able to perform her ususal ADL's and work duties with less than 2/10 overall pain.    Baseline  Pt reporting that most days she is able to perform her ALD's with 2/10 pain but some days are worse.    Time  4    Period  Weeks    Status  Partially Met            Plan - 02/26/19 1651    Clinical Impression Statement  Patient reporting increased L elbow pain after last session, however unsure which activity flared her up. Worked on shoulder rotation strengthening with good tolerance at elbow. Patient noting that lifting overhead is getting easier, but still having trouble lifting groceries from floor up to counter. Simulated this activity with patient noting "stretching and tingling" at medial epicondyle. Worked on wrist strengthening with use of long dowel rod for increased challenge with good tolerance. Patient also reporting difficulty with fine motor control and pinch grip strength, thus updated HEP with pinch gripping activities with putty. Patient reported understanding and with no complaints at end of session. Patient progressing well towards goals.    Comorbidities  kidney stone, depression, bipolar, anxiety, anemia  Rehab Potential  Good    PT  Treatment/Interventions  ADLs/Self Care Home Management;Cryotherapy;Electrical Stimulation;Moist Heat;Therapeutic exercise;Therapeutic activities;Ultrasound;Neuromuscular re-education;Patient/family education;Manual techniques;Vasopneumatic Device;Taping;Splinting;Energy conservation;Dry needling;Passive range of motion;Scar mobilization    PT Next Visit Plan  continue to simulate ADL and work activities requiring full-handed grasp, pinch grip activities    PT Home Exercise Plan  lifting, reaching, instructed in taking jar tops or spice jar tops on and off    Consulted and Agree with Plan of Care  Patient       Patient will benefit from skilled therapeutic intervention in order to improve the following deficits and impairments:  Hypomobility, Increased edema, Decreased scar mobility, Decreased activity tolerance, Increased fascial restricitons, Pain, Impaired UE functional use, Improper body mechanics, Decreased range of motion, Impaired flexibility, Postural dysfunction  Visit Diagnosis: Pain in left elbow  Stiffness of left elbow, not elsewhere classified  Stiffness of left wrist, not elsewhere classified  Muscle weakness (generalized)     Problem List Patient Active Problem List   Diagnosis Date Noted  . Right carpal tunnel syndrome 12/09/2018  . Closed nondisplaced fracture of fifth left metatarsal bone 05/10/2018  . History of shingles 01/08/2018  . Menopause 01/08/2018  . Left ankle injury, subsequent encounter 08/04/2017  . PCP NOTES >>>>>>>>>> 07/11/2017  . 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 Chronic knee pain 04/18/2015  . Plantar fasciitis 04/18/2015  . Tobacco use disorder 03/15/2015  . Morbid obesity ([place]) 01/05/2015  . Bipolar affective ([place]) 01/02/2015     Janene Harvey, PT, DPT 02/26/19 4:56 PM   [facility] [address]  [place], [place], [address] Phone: [phone]   Fax:   [phone]  Name: Ashley [facility] MRN: [id number] Date of Birth: [birth year]

## 2019-02-27 ENCOUNTER — Ambulatory Visit: Payer: No Typology Code available for payment source | Admitting: Neurology

## 2019-02-27 ENCOUNTER — Telehealth: Payer: Self-pay | Admitting: Neurology

## 2019-02-27 ENCOUNTER — Other Ambulatory Visit: Payer: Self-pay

## 2019-02-27 DIAGNOSIS — R55 Syncope and collapse: Secondary | ICD-10-CM

## 2019-02-27 NOTE — Procedures (Signed)
    History:  Ashley Franklin is a 48 year old patient with a history of some problems with daytime drowsiness.  The patient will have episodes where she will nod off, wake up and then fall asleep again.  The patient may feel spacey and may have problems with word finding.  The patient has been evaluated for this episodes.  The patient was on diazepam 15 mg daily during these episodes.  This is a routine EEG.  No skull defects are noted.  Medications include diazepam, Depakote, Prozac, gabapentin, and Lamictal.  EEG classification: Normal awake  Description of the recording: The background rhythms of this recording consists of a fairly well modulated medium amplitude alpha rhythm of 10 Hz that is reactive to eye opening and closure. As the record progresses, the patient appears to remain in the waking state throughout the recording. Photic stimulation was performed, resulting in a bilateral and symmetric photic driving response. Hyperventilation was also performed, resulting in a minimal buildup of the background rhythm activities without significant slowing seen. At no time during the recording does there appear to be evidence of spike or spike wave discharges or evidence of focal slowing. EKG monitor shows no evidence of cardiac rhythm abnormalities with a heart rate of 78.  Impression: This is a normal EEG recording in the waking state. No evidence of ictal or interictal discharges are seen.

## 2019-02-27 NOTE — Telephone Encounter (Signed)
Called the patient, EEG study was normal.  No evidence of seizures.

## 2019-03-03 ENCOUNTER — Ambulatory Visit: Payer: PRIVATE HEALTH INSURANCE

## 2019-03-03 ENCOUNTER — Other Ambulatory Visit: Payer: Self-pay

## 2019-03-03 ENCOUNTER — Encounter: Payer: Self-pay | Admitting: Internal Medicine

## 2019-03-03 DIAGNOSIS — M6281 Muscle weakness (generalized): Secondary | ICD-10-CM

## 2019-03-03 DIAGNOSIS — M25522 Pain in left elbow: Secondary | ICD-10-CM

## 2019-03-03 DIAGNOSIS — M25632 Stiffness of left wrist, not elsewhere classified: Secondary | ICD-10-CM

## 2019-03-03 DIAGNOSIS — M25622 Stiffness of left elbow, not elsewhere classified: Secondary | ICD-10-CM

## 2019-03-03 NOTE — Therapy (Signed)
[facility] [address]  [place], [place], [address] Phone: [phone]   Fax:  [phone]  Physical Therapy Treatment  Patient Details  Name: Ashley Franklin MRN: [id number] Date of Birth: 1971/01/31 Referring Provider (PT): Milly Jakob, MD   Encounter Date: 03/03/2019  PT End of Session - 03/03/19 1633    Visit Number  23    Number of Visits  25    Date for PT Re-Evaluation  03/10/19    Authorization Type  WC: 1 eval + 8 tx + 8 tx +8 tx    Authorization Time Period  progress note sent on 02/19/2019 (20th visit)    Authorization - Visit Number  23    Authorization - Number of Visits  25    PT Start Time  1620    PT Stop Time  1701    PT Time Calculation (min)  41 min    Activity Tolerance  Patient tolerated treatment well;Patient limited by pain    Behavior During Therapy  [facility] for tasks assessed/performed       Past Medical History:  Diagnosis Date  . Anemia   . Anxiety   . Bipolar affective disorder ([place])   . Blood transfusion without reported diagnosis   . Bronchospasm with bronchitis, acute   . Depression   . Kidney stone   . Right carpal tunnel syndrome 12/09/2018  . SBO (small bowel obstruction) ([place]) 01/2014    Past Surgical History:  Procedure Laterality Date  . CESAREAN SECTION    . ENDOMETRIAL ABLATION    . GASTRIC BYPASS  2001  . SBO  2015    There were no vitals filed for this visit.  Subjective Assessment - 03/03/19 1627    Subjective  Pt. reporting she broke a tooth on a "hard cookie" on Friday.    Pertinent History  kidney stone, depression, bipolar, anxiety, anemia    Patient Stated Goals  return to work    Currently in Pain?  Yes    Pain Score  3     Pain Location  Elbow    Pain Orientation  Left    Pain Descriptors / Indicators  Aching    Pain Type  Surgical pain;Acute pain    Pain Onset  More than a month ago    Pain Frequency  Intermittent                        [facility] Adult PT Treatment/Exercise - 03/03/19 0001      Self-Care   Self-Care  Other Self-Care Comments    Other Self-Care Comments   Simulated using tourniquet (with thin blue TB) wrapping rolled towel 7# ankle weight inside (simulating pt. arm) x 5 reps       Elbow Exercises   Forearm Supination  Left;AROM;15 reps    Forearm Supination Limitations  with golf club conservative     Forearm Pronation  Left;15 reps;AROM    Forearm Pronation Limitations  with golf club conservative       Shoulder Exercises: ROM/Strengthening   UBE (Upper Arm Bike)  L2.5 x 3 min forward/3 min backwards      Hand Exercises   Other Hand Exercises  theraband colored clips on rod, 8 blue and 8 black)      Wrist Exercises   Wrist Flexion  Strengthening;Both;Seated;20 reps    Bar Weights/Barbell (Wrist Flexion)  3 lbs    Wrist Extension  Left;15 reps;AROM;Strengthening;Seated;Bar weights/barbell    Bar Weights/Barbell (Wrist Extension)  3 lbs    Other wrist exercises  L wrist extension, flexion stretches 2 x 30 sec each way                PT Short Term Goals - 02/19/19 1500      PT SHORT TERM GOAL #1   Title  Independent w/ inital HEP     Time  3    Period  Weeks    Status  Achieved        PT Long Term Goals - 02/19/19 1500      PT LONG TERM GOAL #1   Title  Independent w/ advanced HEP.    Time  4    Period  Weeks    Status  Partially Met      PT LONG TERM GOAL #2   Title  Pt. will demonstrate an increase in Lt elbow and wrist AROM to WNL to increase function.    Time  4    Period  Weeks    Status  Partially Met      PT LONG TERM GOAL #3   Title  Pt. will demonstrate increase in L elbow/wrist strength to 5/5 and Lt grip strength to >40 lbs to allow for increased elbow stability and functional use of arm.    Baseline  L grip with elbow at 90 degrees: 25ppsi, L grip with elbow extended: 22.5 ppsi (average of 3 trials), MMT: grossly 4/5    Time  4     Period  Weeks    Status  Partially Met      PT LONG TERM GOAL #4   Title  Patient to report 90% improvement in L elbow pain.    Baseline  Pt reporting ability to do perform ADL's with tolerable pain depending on activity. Pt feels she has definitely improved since beginning therapy.    Time  4    Period  Weeks    Status  Partially Met      PT LONG TERM GOAL #5   Title  Pt will be able to perform her ususal ADL's and work duties with less than 2/10 overall pain.    Baseline  Pt reporting that most days she is able to perform her ALD's with 2/10 pain but some days are worse.    Time  4    Period  Weeks    Status  Partially Met            Plan - 03/03/19 1634    Clinical Impression Statement  Pt. reporting she "broke a tooth" chewing a cookie on Friday as has been having pain from this however L elbow has been doing well.  Progressed work Therapist, occupational with 7# cuff weight wrapped in towel to simulate pt. UE and spring wrapping blue TB tourniquet x 5.  This task requires pt. with prolonged L pinch grip which seems to be improving in strength and endurance as pt. noting improved comfort with this task today.  Will continue to progress work related simulation in coming session.  Able to progress cloth pin punch gripping exercises today along with wrist and forearm strengthening.  Spring progressing well with therapy toward established LTGs.    Personal Factors and Comorbidities  Comorbidity 3+;Fitness;Past/Current Experience;Profession;Time since onset of injury/illness/exacerbation    Comorbidities  kidney stone, depression, bipolar, anxiety, anemia    Rehab Potential  Good    PT Treatment/Interventions  ADLs/Self Care  Home Management;Cryotherapy;Electrical Stimulation;Moist Heat;Therapeutic exercise;Therapeutic activities;Ultrasound;Neuromuscular re-education;Patient/family education;Manual techniques;Vasopneumatic Device;Taping;Splinting;Energy conservation;Dry needling;Passive range  of motion;Scar mobilization    PT Next Visit Plan  continue to simulate ADL and work activities requiring full-handed grasp, pinch grip activities    PT Home Exercise Plan  lifting, reaching, instructed in taking jar tops or spice jar tops on and off    Consulted and Agree with Plan of Care  Patient       Patient will benefit from skilled therapeutic intervention in order to improve the following deficits and impairments:  Hypomobility, Increased edema, Decreased scar mobility, Decreased activity tolerance, Increased fascial restricitons, Pain, Impaired UE functional use, Improper body mechanics, Decreased range of motion, Impaired flexibility, Postural dysfunction  Visit Diagnosis: Pain in left elbow  Stiffness of left elbow, not elsewhere classified  Stiffness of left wrist, not elsewhere classified  Muscle weakness (generalized)     Problem List Patient Active Problem List   Diagnosis Date Noted  . Right carpal tunnel syndrome 12/09/2018  . Closed nondisplaced fracture of fifth left metatarsal bone 05/10/2018  . History of shingles 01/08/2018  . Menopause 01/08/2018  . Left ankle injury, subsequent encounter 08/04/2017  . PCP NOTES >>>>>>>>>> 07/11/2017  . 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 Chronic knee pain 04/18/2015  . Plantar fasciitis 04/18/2015  . Tobacco use disorder 03/15/2015  . Morbid obesity ([place]) 01/05/2015  . Bipolar affective ([place]) 01/02/2015    Bess Harvest, PTA 03/03/19 5:21 PM   [facility] [address]  [place], [place], [address] Phone: [phone]   Fax:  [phone]  Name: KAILANI BRASS MRN: [id number] Date of Birth: 1970/11/23

## 2019-03-04 ENCOUNTER — Other Ambulatory Visit (HOSPITAL_BASED_OUTPATIENT_CLINIC_OR_DEPARTMENT_OTHER): Payer: Self-pay | Admitting: Internal Medicine

## 2019-03-04 DIAGNOSIS — Z1231 Encounter for screening mammogram for malignant neoplasm of breast: Secondary | ICD-10-CM

## 2019-03-04 MED FILL — PENICILLIN VK 500 MG TABLET: 500 | 5 days supply | Qty: 20 | Fill #0

## 2019-03-05 ENCOUNTER — Ambulatory Visit: Payer: PRIVATE HEALTH INSURANCE | Admitting: Physical Therapy

## 2019-03-05 ENCOUNTER — Other Ambulatory Visit: Payer: Self-pay

## 2019-03-05 ENCOUNTER — Encounter (HOSPITAL_BASED_OUTPATIENT_CLINIC_OR_DEPARTMENT_OTHER): Payer: No Typology Code available for payment source

## 2019-03-05 ENCOUNTER — Telehealth: Payer: Self-pay

## 2019-03-05 ENCOUNTER — Encounter: Payer: Self-pay | Admitting: Physical Therapy

## 2019-03-05 DIAGNOSIS — M25522 Pain in left elbow: Secondary | ICD-10-CM | POA: Diagnosis not present

## 2019-03-05 DIAGNOSIS — M25632 Stiffness of left wrist, not elsewhere classified: Secondary | ICD-10-CM

## 2019-03-05 DIAGNOSIS — M6281 Muscle weakness (generalized): Secondary | ICD-10-CM

## 2019-03-05 DIAGNOSIS — R55 Syncope and collapse: Secondary | ICD-10-CM

## 2019-03-05 DIAGNOSIS — M25622 Stiffness of left elbow, not elsewhere classified: Secondary | ICD-10-CM

## 2019-03-05 NOTE — Therapy (Signed)
[facility] [address]  [place], [place], [address] Phone: [phone]   Fax:  [phone]  Physical Therapy Treatment  Patient Details  Name: Ashley Franklin MRN: [id number] Date of Birth: 29-Apr-1971 Referring Provider (PT): Milly Jakob, MD   Encounter Date: 03/05/2019  PT End of Session - 03/05/19 1609    Visit Number  24    Number of Visits  25    Date for PT Re-Evaluation  03/10/19    Authorization Type  WC: 1 eval + 8 tx + 8 tx +8 tx    Authorization Time Period  progress note sent on 02/19/2019 (20th visit)    Authorization - Visit Number  24    Authorization - Number of Visits  25    PT Start Time  1530    PT Stop Time  1621    PT Time Calculation (min)  51 min    Activity Tolerance  Patient tolerated treatment well;Patient limited by pain    Behavior During Therapy  [facility] for tasks assessed/performed       Past Medical History:  Diagnosis Date  . Anemia   . Anxiety   . Bipolar affective disorder ([place])   . Blood transfusion without reported diagnosis   . Bronchospasm with bronchitis, acute   . Depression   . Kidney stone   . Right carpal tunnel syndrome 12/09/2018  . SBO (small bowel obstruction) ([place]) 01/2014    Past Surgical History:  Procedure Laterality Date  . CESAREAN SECTION    . ENDOMETRIAL ABLATION    . GASTRIC BYPASS  2001  . SBO  2015    There were no vitals filed for this visit.  Subjective Assessment - 03/05/19 1531    Subjective  L elbow has been at about a 3/10 pain since last session. Notes it is a "shocking" pain.    Pertinent History  kidney stone, depression, bipolar, anxiety, anemia    Patient Stated Goals  return to work    Currently in Pain?  Yes    Pain Score  4     Pain Location  Elbow    Pain Orientation  Left    Pain Descriptors / Indicators  --   shocking   Pain Type  Acute pain;Surgical pain         [facility] PT Assessment - 03/05/19 0001      AROM   Right/Left Elbow  --   measurements carried over from 10/07   Left Elbow Flexion  142    Left Elbow Extension  0    Left Wrist Extension  82 Degrees    Left Wrist Flexion  90 Degrees    Left Wrist Radial Deviation  25 Degrees    Left Wrist Ulnar Deviation  45 Degrees      Strength   Overall Strength  --   measurements carried over from 10/07   Left Elbow Flexion  4+/5    Left Elbow Extension  4/5    Left Wrist Flexion  4+/5    Left Wrist Extension  4/5    Left Wrist Radial Deviation  4+/5    Left Wrist Ulnar Deviation  4+/5    Left Hand Grip (lbs)  3.67                   [facility] Adult PT Treatment/Exercise - 03/05/19 0001      Shoulder Exercises: ROM/Strengthening   UBE (Upper Arm Bike)  L2.3 x 3 min forward/3 min backwards      Wrist Exercises   Other wrist exercises  L wrist extension, flexion stretches 2 x 30 sec each way       Modalities   Modalities  Moist Heat;Electrical Stimulation      Moist Heat Therapy   Number Minutes Moist Heat  15 Minutes    Moist Heat Location  Elbow   L     Electrical Stimulation   Electrical Stimulation Location  surrounding L lateral epicondyle and extensors    Electrical Stimulation Action  IFC    Electrical Stimulation Parameters  80-'150hz'$ l output to tolerance; 15 min    Electrical Stimulation Goals  Tone;Pain      Manual Therapy   Manual Therapy  Soft tissue mobilization;Myofascial release    Manual therapy comments  sitting    Soft tissue mobilization  STM to L brachioradialis, wrist extensors, wrist flexors, pronator teres- palpable painful trigger points evident throughout    Myofascial Release  manual TPR to L brachioradialis, wrist extensors, wrist flexors, pronator teres- intermittent twitch response and c/o relief               PT Short Term Goals - 02/19/19 1500      PT SHORT TERM GOAL #1   Title  Independent w/ inital HEP     Time  3    Period  Weeks    Status  Achieved        PT Long Term  Goals - 02/19/19 1500      PT LONG TERM GOAL #1   Title  Independent w/ advanced HEP.    Time  4    Period  Weeks    Status  Partially Met      PT LONG TERM GOAL #2   Title  Pt. will demonstrate an increase in Lt elbow and wrist AROM to WNL to increase function.    Time  4    Period  Weeks    Status  Partially Met      PT LONG TERM GOAL #3   Title  Pt. will demonstrate increase in L elbow/wrist strength to 5/5 and Lt grip strength to >40 lbs to allow for increased elbow stability and functional use of arm.    Baseline  L grip with elbow at 90 degrees: 25ppsi, L grip with elbow extended: 22.5 ppsi (average of 3 trials), MMT: grossly 4/5    Time  4    Period  Weeks    Status  Partially Met      PT LONG TERM GOAL #4   Title  Patient to report 90% improvement in L elbow pain.    Baseline  Pt reporting ability to do perform ADL's with tolerable pain depending on activity. Pt feels she has definitely improved since beginning therapy.    Time  4    Period  Weeks    Status  Partially Met      PT LONG TERM GOAL #5   Title  Pt will be able to perform her ususal ADL's and work duties with less than 2/10 overall pain.    Baseline  Pt reporting that most days she is able to perform her ALD's with 2/10 pain but some days are worse.    Time  4    Period  Weeks    Status  Partially Met            Plan - 03/05/19 1609  Clinical Impression Statement  Patient reporting increase in L elbow pain since last session. Notes that she has returned to performing ice massage to help relieve pain, but this has not helped much. Worked more of pain management this session d/t patient's report of flare up. Patient demonstrating severe soft tissue restriction and palpable painful trigger points throughout L brachioradialis, wrist flexors, wrist extensors, and pronator teres muscles. Reported relief with manual therapy. Tolerated gentle wrist stretching with report of most difficulty with wrist extensor  stretch. Ended session with moist heat and e-stim to L forearm for pain relief. Patient reporting good pain relief after use of tens. D/t chronicity and intensity of patient's pain, believe patient would benefit from personal TENS unit in order to help manage pain after flare ups and improve ADL tolerance at home. Patient without complaints at end of session.    Personal Factors and Comorbidities  Comorbidity 3+;Fitness;Past/Current Experience;Profession;Time since onset of injury/illness/exacerbation    Comorbidities  kidney stone, depression, bipolar, anxiety, anemia    Rehab Potential  Good    PT Treatment/Interventions  ADLs/Self Care Home Management;Cryotherapy;Electrical Stimulation;Moist Heat;Therapeutic exercise;Therapeutic activities;Ultrasound;Neuromuscular re-education;Patient/family education;Manual techniques;Vasopneumatic Device;Taping;Splinting;Energy conservation;Dry needling;Passive range of motion;Scar mobilization    PT Next Visit Plan  continue to simulate ADL and work activities requiring full-handed grasp, pinch grip activities    PT Home Exercise Plan  lifting, reaching, instructed in taking jar tops or spice jar tops on and off    Consulted and Agree with Plan of Care  Patient       Patient will benefit from skilled therapeutic intervention in order to improve the following deficits and impairments:  Hypomobility, Increased edema, Decreased scar mobility, Decreased activity tolerance, Increased fascial restricitons, Pain, Impaired UE functional use, Improper body mechanics, Decreased range of motion, Impaired flexibility, Postural dysfunction  Visit Diagnosis: Pain in left elbow  Stiffness of left elbow, not elsewhere classified  Stiffness of left wrist, not elsewhere classified  Muscle weakness (generalized)     Problem List Patient Active Problem List   Diagnosis Date Noted  . Right carpal tunnel syndrome 12/09/2018  . Closed nondisplaced fracture of fifth left  metatarsal bone 05/10/2018  . History of shingles 01/08/2018  . Menopause 01/08/2018  . Left ankle injury, subsequent encounter 08/04/2017  . PCP NOTES >>>>>>>>>> 07/11/2017  . 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 Chronic knee pain 04/18/2015  . Plantar fasciitis 04/18/2015  . Tobacco use disorder 03/15/2015  . Morbid obesity ([place]) 01/05/2015  . Bipolar affective ([place]) 01/02/2015    Janene Harvey, PT, DPT 03/05/19 4:28 PM   [facility] [address]  [place], [place], [address] Phone: [phone]   Fax:  [phone]  Name: JELIYAH MIDDLEBROOKS MRN: [id number] Date of Birth: 1971/02/28

## 2019-03-05 NOTE — Telephone Encounter (Signed)
[employer] paperwork received. Placed in PCP red folder for completion.

## 2019-03-06 ENCOUNTER — Encounter: Payer: Self-pay | Admitting: Internal Medicine

## 2019-03-06 ENCOUNTER — Other Ambulatory Visit: Payer: Self-pay | Admitting: Neurology

## 2019-03-06 DIAGNOSIS — Z0279 Encounter for issue of other medical certificate: Secondary | ICD-10-CM

## 2019-03-06 DIAGNOSIS — G479 Sleep disorder, unspecified: Secondary | ICD-10-CM

## 2019-03-06 NOTE — Telephone Encounter (Signed)
Received fax confirmation- form sent for scanning.

## 2019-03-06 NOTE — Telephone Encounter (Signed)
Paperwork completed. I advised her to stop working and driving on January 29, 2019. Expected return to work date: TBD

## 2019-03-06 NOTE — Telephone Encounter (Signed)
Forms faxed to [employer] at [phone]. Forms sent for scanning.  

## 2019-03-10 ENCOUNTER — Encounter: Payer: Self-pay | Admitting: Physical Therapy

## 2019-03-10 ENCOUNTER — Telehealth: Payer: Self-pay

## 2019-03-10 ENCOUNTER — Ambulatory Visit (HOSPITAL_COMMUNITY): Payer: No Typology Code available for payment source | Admitting: Psychiatry

## 2019-03-10 ENCOUNTER — Ambulatory Visit: Payer: PRIVATE HEALTH INSURANCE | Admitting: Physical Therapy

## 2019-03-10 ENCOUNTER — Ambulatory Visit (HOSPITAL_BASED_OUTPATIENT_CLINIC_OR_DEPARTMENT_OTHER)
Admission: RE | Admit: 2019-03-10 | Discharge: 2019-03-10 | Disposition: A | Discharge status: Home / Self Care | Payer: No Typology Code available for payment source | Source: Ambulatory Visit | Attending: Internal Medicine | Admitting: Internal Medicine

## 2019-03-10 ENCOUNTER — Other Ambulatory Visit: Payer: Self-pay

## 2019-03-10 DIAGNOSIS — Z1231 Encounter for screening mammogram for malignant neoplasm of breast: Secondary | ICD-10-CM | POA: Diagnosis present

## 2019-03-10 DIAGNOSIS — M25522 Pain in left elbow: Secondary | ICD-10-CM | POA: Diagnosis not present

## 2019-03-10 DIAGNOSIS — M25622 Stiffness of left elbow, not elsewhere classified: Secondary | ICD-10-CM

## 2019-03-10 DIAGNOSIS — M6281 Muscle weakness (generalized): Secondary | ICD-10-CM

## 2019-03-10 DIAGNOSIS — M25632 Stiffness of left wrist, not elsewhere classified: Secondary | ICD-10-CM

## 2019-03-10 NOTE — Telephone Encounter (Signed)
FMLA paperwork received from [employer]- Pt needing extension- previous FMLA ended 02/28/2019. Forms placed in PCP red folder for completion.

## 2019-03-10 NOTE — Therapy (Signed)
[facility] [address]  [place], [place], [address] Phone: [phone]   Fax:  [phone]  Physical Therapy Treatment  Patient Details  Name: Ashley Franklin MRN: [id number] Date of Birth: [birth year] Referring Provider (PT): Milly Jakob, MD   Encounter Date: 03/10/2019  PT End of Session - 03/10/19 1619    Visit Number  25    Number of Visits  33    Date for PT Re-Evaluation  04/07/19    Authorization Type  WC: 1 eval + 8 tx + 8 tx +8 tx + 8tx    Authorization - Visit Number  25    Authorization - Number of Visits  33    PT Start Time  0272    PT Stop Time  1608    PT Time Calculation (min)  38 min    Activity Tolerance  Patient tolerated treatment well    Behavior During Therapy  WFL for tasks assessed/performed       Past Medical History:  Diagnosis Date  . Anemia   . Anxiety   . Bipolar affective disorder ([place])   . Blood transfusion without reported diagnosis   . Bronchospasm with bronchitis, acute   . Depression   . Kidney stone   . Right carpal tunnel syndrome 12/09/2018  . SBO (small bowel obstruction) ([place]) 01/2014    Past Surgical History:  Procedure Laterality Date  . CESAREAN SECTION    . ENDOMETRIAL ABLATION    . GASTRIC BYPASS  2001  . SBO  2015    There were no vitals filed for this visit.  Subjective Assessment - 03/10/19 1532    Subjective  Was given 8 more visits at last MD appointment and will be receiving a TENS unit. MD did not say much at last session. Feeling better since last flare up. Reports 60% improvement since initial eval- feeling that she has improved in grip strength, lifting, and fine motor control. Would like to continue working on overall strength.    Pertinent History  kidney stone, depression, bipolar, anxiety, anemia    Patient Stated Goals  return to work    Currently in Pain?  Yes    Pain Score  2     Pain Location  Elbow    Pain Orientation  Left    Pain Descriptors / Indicators  Aching    Pain Type  Acute pain;Surgical pain         [facility] PT Assessment - 03/10/19 0001      Assessment   Medical Diagnosis  s/p L lateral epicondylitis debridement    Referring Provider (PT)  Milly Jakob, MD    Onset Date/Surgical Date  11/29/18      AROM   Left Elbow Flexion  146    Left Elbow Extension  0    Left Wrist Extension  73 Degrees    Left Wrist Flexion  75 Degrees    Left Wrist Radial Deviation  25 Degrees    Left Wrist Ulnar Deviation  45 Degrees      Strength   Left Elbow Flexion  4+/5    Left Elbow Extension  4+/5    Left Wrist Flexion  4+/5    Left Wrist Extension  4+/5    Left Wrist Radial Deviation  4+/5    Left Wrist Ulnar Deviation  4+/5    Left Hand Grip (lbs)  11 w/table support, 10.67 w/out table support   12, 10,11  w/table; 10, 15, 7 w/out table                  [facility] Adult PT Treatment/Exercise - 03/10/19 0001      Hand Exercises   Other Hand Exercises  L full hand grip holding onto end of 10# dumbbell 2x30"   c/o discomfort in elbow   Other Hand Exercises  L 3 pinch grip holding 2.5# plate x30"; two 7.0# plates x30"; two 6.2# plates with additional gentle PT pull on plates x30";   elbow extended; c/o discomfort in elbow            PT Education - 03/10/19 1617    Education Details  discussion on objective progress with PT and remaining limitations; advised patient to perform pinch grip exercises with large textbook    Person(s) Educated  Patient    Methods  Explanation;Demonstration;Tactile cues;Verbal cues    Comprehension  Verbalized understanding;Returned demonstration       PT Short Term Goals - 03/10/19 1536      PT SHORT TERM GOAL #1   Title  Independent w/ inital HEP     Time  3    Period  Weeks    Status  Achieved        PT Long Term Goals - 03/10/19 1536      PT LONG TERM GOAL #1   Title  Independent w/ advanced HEP.    Time  4    Period  Weeks    Status   Partially Met   met for current   Target Date  04/07/19      PT LONG TERM GOAL #2   Title  Pt. will demonstrate an increase in Lt elbow and wrist AROM to WNL to increase function.    Time  4    Period  Weeks    Status  Partially Met   last measurements of L wrist taken were PROM, not AROM. Patient has acheived normal L elbow AROM, with L wrist flexion/extension slightly limiting   Target Date  04/07/19      PT LONG TERM GOAL #3   Title  Pt. will demonstrate increase in L elbow/wrist strength to 5/5 and Lt grip strength to >40 lbs to allow for increased elbow stability and functional use of arm.    Baseline  L grip with elbow at 90 degrees: 25ppsi, L grip with elbow extended: 22.5 ppsi (average of 3 trials), MMT: grossly 4/5    Time  4    Period  Weeks    Status  Partially Met   L elbow and wrist extension improved, grip strength 11 lbs with elbow supported   Target Date  04/07/19      PT LONG TERM GOAL #4   Title  Patient to report 90% improvement in L elbow pain.    Baseline  Pt reporting ability to do perform ADL's with tolerable pain depending on activity. Pt feels she has definitely improved since beginning therapy.    Time  4    Period  Weeks    Status  Partially Met   reports 80% improvement in pain levels   Target Date  04/07/19      PT LONG TERM GOAL #5   Title  Pt will be able to perform her ususal ADL's and work duties with less than 2/10 overall pain.    Baseline  Pt reporting that most days she is able to perform her ALD's with 2/10 pain but some  days are worse.    Time  4    Period  Weeks    Status  Partially Met   reports average of 4/10 pain with vacuuming and lifting groceries   Target Date  04/07/19            Plan - 03/10/19 1741    Clinical Impression Statement  Patient reporting 60% improvement in L elbow since initial eval. Notes that she has noticed improvement in grip strength, lifting, and fine motor control. Would like to continue working on  overall strengthening. Patient has demonstrated improvement in L elbow flexion. Strength testing revealed improvements in elbow extension and wrist extension strength, as well as grip strength. However, patient still has room for improvement. Patient reports 80% improvement in pain levels at this time. Still noting an average of 4/10 pain with vacuuming and lifting groceries. Worked on challenging pinch and full hand grip endurance. Patient tolerated these exercises well, thus advised patient to work on pinch grip strengthening with a textbook at home. Patient reported understanding and with no complaints at end of session. Patient continues to show good improvement in strength and ROM. Would benefit from continued skilled PT services 2x/week for 4 weeks to address remaining goals and return to work.    Personal Factors and Comorbidities  Comorbidity 3+;Fitness;Past/Current Experience;Profession;Time since onset of injury/illness/exacerbation    Comorbidities  kidney stone, depression, bipolar, anxiety, anemia    Rehab Potential  Good    PT Frequency  2x / week    PT Duration  4 weeks    PT Treatment/Interventions  ADLs/Self Care Home Management;Cryotherapy;Electrical Stimulation;Moist Heat;Therapeutic exercise;Therapeutic activities;Ultrasound;Neuromuscular re-education;Patient/family education;Manual techniques;Vasopneumatic Device;Taping;Splinting;Energy conservation;Dry needling;Passive range of motion;Scar mobilization    PT Next Visit Plan  continue to simulate ADL and work activities requiring full-handed grasp, pinch grip activities    PT Home Exercise Plan  lifting, reaching, instructed in taking jar tops or spice jar tops on and off    Consulted and Agree with Plan of Care  Patient       Patient will benefit from skilled therapeutic intervention in order to improve the following deficits and impairments:  Hypomobility, Increased edema, Decreased scar mobility, Decreased activity tolerance,  Increased fascial restricitons, Pain, Impaired UE functional use, Improper body mechanics, Decreased range of motion, Impaired flexibility, Postural dysfunction  Visit Diagnosis: Pain in left elbow  Stiffness of left elbow, not elsewhere classified  Stiffness of left wrist, not elsewhere classified  Muscle weakness (generalized)     Problem Franklin Patient Active Problem Franklin   Diagnosis Date Noted  . Right carpal tunnel syndrome 12/09/2018  . Closed nondisplaced fracture of fifth left metatarsal bone 05/10/2018  . History of shingles 01/08/2018  . Menopause 01/08/2018  . Left ankle injury, subsequent encounter 08/04/2017  . PCP NOTES >>>>>>>>>> 07/11/2017  . 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 Chronic knee pain 04/18/2015  . Plantar fasciitis 04/18/2015  . Tobacco use disorder 03/15/2015  . Morbid obesity ([place]) 01/05/2015  . Bipolar affective ([place]) 01/02/2015     Janene Harvey, PT, DPT 03/10/19 5:43 PM   [facility] [address]  [place], [place], [address] Phone: [phone]   Fax:  [phone]  Name: Ashley Franklin MRN: [id number] Date of Birth: Sep 16, 1970

## 2019-03-11 ENCOUNTER — Ambulatory Visit: Payer: No Typology Code available for payment source | Admitting: Psychology

## 2019-03-13 ENCOUNTER — Other Ambulatory Visit: Payer: Self-pay

## 2019-03-13 ENCOUNTER — Ambulatory Visit: Payer: PRIVATE HEALTH INSURANCE

## 2019-03-13 ENCOUNTER — Ambulatory Visit: Payer: No Typology Code available for payment source | Admitting: Neurology

## 2019-03-13 DIAGNOSIS — M25522 Pain in left elbow: Secondary | ICD-10-CM

## 2019-03-13 DIAGNOSIS — M25622 Stiffness of left elbow, not elsewhere classified: Secondary | ICD-10-CM

## 2019-03-13 DIAGNOSIS — Z0279 Encounter for issue of other medical certificate: Secondary | ICD-10-CM

## 2019-03-13 DIAGNOSIS — M6281 Muscle weakness (generalized): Secondary | ICD-10-CM

## 2019-03-13 DIAGNOSIS — M25632 Stiffness of left wrist, not elsewhere classified: Secondary | ICD-10-CM

## 2019-03-13 NOTE — Telephone Encounter (Signed)
Form completed.

## 2019-03-13 NOTE — Therapy (Signed)
[facility] [address]  [place], [place], [address] Phone: [phone]   Fax:  [phone]  Physical Therapy Treatment  Patient Details  Name: Ashley Franklin MRN: [id number] Date of Birth: 14-Jan-1971 Referring Provider (PT): Milly Jakob, MD   Encounter Date: 03/13/2019  PT End of Session - 03/13/19 1533    Visit Number  26    Number of Visits  33    Date for PT Re-Evaluation  04/07/19    Authorization Type  WC: 1 eval + 8 tx + 8 tx +8 tx + 8tx    Authorization - Visit Number  94    Authorization - Number of Visits  33    PT Start Time  7026    PT Stop Time  1608    PT Time Calculation (min)  38 min    Activity Tolerance  Patient tolerated treatment well    Behavior During Therapy  WFL for tasks assessed/performed       Past Medical History:  Diagnosis Date  . Anemia   . Anxiety   . Bipolar affective disorder ([place])   . Blood transfusion without reported diagnosis   . Bronchospasm with bronchitis, acute   . Depression   . Kidney stone   . Right carpal tunnel syndrome 12/09/2018  . SBO (small bowel obstruction) ([place]) 01/2014    Past Surgical History:  Procedure Laterality Date  . CESAREAN SECTION    . ENDOMETRIAL ABLATION    . GASTRIC BYPASS  2001  . SBO  2015    There were no vitals filed for this visit.  Subjective Assessment - 03/13/19 1532    Subjective  Pt. reporting latest HEP updated is very challanging.    Pertinent History  kidney stone, depression, bipolar, anxiety, anemia    Patient Stated Goals  return to work    Currently in Pain?  Yes    Pain Score  3     Pain Location  Elbow    Pain Orientation  Left    Pain Descriptors / Indicators  Aching    Pain Type  Acute pain;Surgical pain    Pain Onset  More than a month ago    Pain Frequency  Intermittent    Multiple Pain Sites  No         [facility] PT Assessment - 03/13/19 0001      Assessment   Medical Diagnosis  s/p L lateral  epicondylitis debridement    Referring Provider (PT)  Milly Jakob, MD    Onset Date/Surgical Date  11/29/18    Next MD Visit  04/03/19                   [facility] Adult PT Treatment/Exercise - 03/13/19 0001      Self-Care   Other Self-Care Comments   Simulated using tourniquet (with thin blue TB) wrapping rolled towel 7# ankle weight inside (simulating pt. arm) x 7 reps       Shoulder Exercises: ROM/Strengthening   UBE (Upper Arm Bike)  L2.5 x 3 min forward/3 min backwards    Lat Pull  15 reps    Lat Pull Limitations  25# - mid grip    used for L grip strengthening activities also   Cybex Row  15 reps    Cybex Row Limitations  20# - low handles    used for L grip strengthening activities also   Wall Pushups  15 reps  Wall Pushups Limitations  cues for elbows in to sides and neutral wrist positioning       Hand Exercises   Other Hand Exercises  theraband colored clips 4 x 6 clips on hangers (black clips)       Wrist Exercises   Wrist Flexion  Left;15 reps;Seated;AROM;Strengthening    Bar Weights/Barbell (Wrist Flexion)  4 lbs    Wrist Extension  Left;15 reps;AROM;Strengthening;Seated;Bar weights/barbell    Bar Weights/Barbell (Wrist Extension)  4 lbs    Wrist Radial Deviation  Left;15 reps;AROM;Theraband;Seated;Strengthening    Theraband Level (Radial Deviation)  Level 2 (Red)    Wrist Ulnar Deviation  Left;15 reps;Strengthening;AROM;Seated;Theraband    Theraband Level (Ulnar Deviation)  Level 2 (Red)    Other wrist exercises  L wrist extension, flexion stretches 2 x 30 sec each way                PT Short Term Goals - 03/10/19 1536      PT SHORT TERM GOAL #1   Title  Independent w/ inital HEP     Time  3    Period  Weeks    Status  Achieved        PT Long Term Goals - 03/10/19 1536      PT LONG TERM GOAL #1   Title  Independent w/ advanced HEP.    Time  4    Period  Weeks    Status  Partially Met   met for current   Target Date  04/07/19       PT LONG TERM GOAL #2   Title  Pt. will demonstrate an increase in Lt elbow and wrist AROM to WNL to increase function.    Time  4    Period  Weeks    Status  Partially Met   last measurements of L wrist taken were PROM, not AROM. Patient has acheived normal L elbow AROM, with L wrist flexion/extension slightly limiting   Target Date  04/07/19      PT LONG TERM GOAL #3   Title  Pt. will demonstrate increase in L elbow/wrist strength to 5/5 and Lt grip strength to >40 lbs to allow for increased elbow stability and functional use of arm.    Baseline  L grip with elbow at 90 degrees: 25ppsi, L grip with elbow extended: 22.5 ppsi (average of 3 trials), MMT: grossly 4/5    Time  4    Period  Weeks    Status  Partially Met   L elbow and wrist extension improved, grip strength 11 lbs with elbow supported   Target Date  04/07/19      PT LONG TERM GOAL #4   Title  Patient to report 90% improvement in L elbow pain.    Baseline  Pt reporting ability to do perform ADL's with tolerable pain depending on activity. Pt feels she has definitely improved since beginning therapy.    Time  4    Period  Weeks    Status  Partially Met   reports 80% improvement in pain levels   Target Date  04/07/19      PT LONG TERM GOAL #5   Title  Pt will be able to perform her ususal ADL's and work duties with less than 2/10 overall pain.    Baseline  Pt reporting that most days she is able to perform her ALD's with 2/10 pain but some days are worse.    Time  4  Period  Weeks    Status  Partially Met   reports average of 4/10 pain with vacuuming and lifting groceries   Target Date  04/07/19            Plan - 03/13/19 1533    Clinical Impression Statement  Spring reporting some L elbow soreness since last session.  Does report she has been performing 10 reps of 30 sec isometric squeeze holds with textbook daily since last session.  Pt. encouraged to reduce to 2 x 30 sec holds daily as to avoid  overexertion of L elbow/wrist.  pt. verbalized understanding.  Tolerated progression of wrist flexion, extension, ulnar deviation, radial deviation and progression of functional grip and tourniquet work-simulation without pain.  Does quickly fatigue with therex.  Ended visit pain free thus modalities deferred.    Personal Factors and Comorbidities  Comorbidity 3+;Fitness;Past/Current Experience;Profession;Time since onset of injury/illness/exacerbation    Comorbidities  kidney stone, depression, bipolar, anxiety, anemia    Rehab Potential  Good    PT Treatment/Interventions  ADLs/Self Care Home Management;Cryotherapy;Electrical Stimulation;Moist Heat;Therapeutic exercise;Therapeutic activities;Ultrasound;Neuromuscular re-education;Patient/family education;Manual techniques;Vasopneumatic Device;Taping;Splinting;Energy conservation;Dry needling;Passive range of motion;Scar mobilization    PT Next Visit Plan  continue to simulate ADL and work activities requiring full-handed grasp, pinch grip activities    PT Home Exercise Plan  lifting, reaching, instructed in taking jar tops or spice jar tops on and off    Consulted and Agree with Plan of Care  Patient       Patient will benefit from skilled therapeutic intervention in order to improve the following deficits and impairments:  Hypomobility, Increased edema, Decreased scar mobility, Decreased activity tolerance, Increased fascial restricitons, Pain, Impaired UE functional use, Improper body mechanics, Decreased range of motion, Impaired flexibility, Postural dysfunction  Visit Diagnosis: Pain in left elbow  Stiffness of left elbow, not elsewhere classified  Stiffness of left wrist, not elsewhere classified  Muscle weakness (generalized)     Problem List Patient Active Problem List   Diagnosis Date Noted  . Right carpal tunnel syndrome 12/09/2018  . Closed nondisplaced fracture of fifth left metatarsal bone 05/10/2018  . History of shingles  01/08/2018  . Menopause 01/08/2018  . Left ankle injury, subsequent encounter 08/04/2017  . PCP NOTES >>>>>>>>>> 07/11/2017  . 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 Chronic knee pain 04/18/2015  . Plantar fasciitis 04/18/2015  . Tobacco use disorder 03/15/2015  . Morbid obesity ([place]) 01/05/2015  . Bipolar affective ([place]) 01/02/2015    Bess Harvest, PTA 03/13/19 5:48 PM   [facility] [address]  [place], [place], [address] Phone: [phone]   Fax:  [phone]  Name: KASHAY CAVENAUGH MRN: [id number] Date of Birth: 1970/09/29

## 2019-03-13 NOTE — Telephone Encounter (Signed)
FMLA extension starting 01/29/2019 to [employer] (tenatively 04/14/2019) faxed back to [employer] at [phone]. Form sent for scanning. Received fax confirmation.

## 2019-03-14 ENCOUNTER — Other Ambulatory Visit: Payer: Self-pay

## 2019-03-14 DIAGNOSIS — Z20822 Contact with and (suspected) exposure to covid-19: Secondary | ICD-10-CM

## 2019-03-15 LAB — NOVEL CORONAVIRUS, NAA: SARS-CoV-2, NAA: NOT DETECTED

## 2019-03-17 ENCOUNTER — Other Ambulatory Visit: Payer: Self-pay

## 2019-03-17 ENCOUNTER — Ambulatory Visit: Payer: PRIVATE HEALTH INSURANCE | Attending: Orthopedic Surgery

## 2019-03-17 ENCOUNTER — Telehealth: Payer: Self-pay | Admitting: Internal Medicine

## 2019-03-17 DIAGNOSIS — S5002XD Contusion of left elbow, subsequent encounter: Secondary | ICD-10-CM | POA: Diagnosis present

## 2019-03-17 DIAGNOSIS — M6281 Muscle weakness (generalized): Secondary | ICD-10-CM | POA: Diagnosis present

## 2019-03-17 DIAGNOSIS — M25632 Stiffness of left wrist, not elsewhere classified: Secondary | ICD-10-CM | POA: Insufficient documentation

## 2019-03-17 DIAGNOSIS — M25622 Stiffness of left elbow, not elsewhere classified: Secondary | ICD-10-CM | POA: Insufficient documentation

## 2019-03-17 DIAGNOSIS — M25522 Pain in left elbow: Secondary | ICD-10-CM | POA: Diagnosis present

## 2019-03-17 NOTE — Telephone Encounter (Signed)
Patient came into the office to drop off a form for Dr. Larose Kells to complete for bariatric surgery. Please fax when completed and call patient for original copy to pick up. Placed in provider tray.

## 2019-03-17 NOTE — Telephone Encounter (Signed)
Forms placed in PCP red folder.

## 2019-03-17 NOTE — Therapy (Signed)
[facility] [address]  [place], [place], [address] Phone: [phone]   Fax:  [phone]  Physical Therapy Treatment  Patient Details  Name: Ashley Franklin MRN: [id number] Date of Birth: [birth year] Referring Provider (PT): Milly Jakob, MD   Encounter Date: 03/17/2019  PT End of Session - 03/17/19 1553    Visit Number  27    Number of Visits  33    Date for PT Re-Evaluation  04/07/19    Authorization Type  WC: 1 eval + 8 tx + 8 tx +8 tx + 8tx    Authorization - Visit Number  27    Authorization - Number of Visits  33    PT Start Time  1534    PT Stop Time  1615    PT Time Calculation (min)  41 min    Activity Tolerance  Patient tolerated treatment well    Behavior During Therapy  WFL for tasks assessed/performed       Past Medical History:  Diagnosis Date  . Anemia   . Anxiety   . Bipolar affective disorder ([place])   . Blood transfusion without reported diagnosis   . Bronchospasm with bronchitis, acute   . Depression   . Kidney stone   . Right carpal tunnel syndrome 12/09/2018  . SBO (small bowel obstruction) ([place]) 01/2014    Past Surgical History:  Procedure Laterality Date  . CESAREAN SECTION    . ENDOMETRIAL ABLATION    . GASTRIC BYPASS  2001  . SBO  2015    There were no vitals filed for this visit.  Subjective Assessment - 03/17/19 1807    Subjective  Ashley Franklin reporting she recieved her TENS home unit and wishes to be instructed in proper use and setup    Pertinent History  kidney stone, depression, bipolar, anxiety, anemia    Patient Stated Goals  return to work    Currently in Pain?  No/denies    Pain Score  0-No pain    Multiple Pain Sites  No                       [facility] Adult PT Treatment/Exercise - 03/17/19 0001      Self-Care   Self-Care  Other Self-Care Comments    Other Self-Care Comments   TENS unit instruction along with reference to TENS diagram handout;  instruction in proper setup, electrode placement, demo on therapist and pt., and rationale behind TENS unit for pain relief including contraindications/precuations       Neck Exercises: Machines for Strengthening   Nustep  Lvl 5, 6 min (UE/LE)      Wrist Exercises   Wrist Flexion  Left;AROM;10 reps    Bar Weights/Barbell (Wrist Flexion)  5 lbs    Wrist Extension  Left;10 reps;AROM    Bar Weights/Barbell (Wrist Extension)  5 lbs    Other wrist exercises  L wrist extension, flexion stretches 2 x 30 sec each way              PT Education - 03/17/19 1810    Education Details  Handout for TENS unit use, purpose, electroe diagram    Person(s) Educated  Patient    Methods  Explanation;Demonstration;Verbal cues;Handout    Comprehension  Verbalized understanding;Returned demonstration;Verbal cues required       PT Short Term Goals - 03/10/19 1536      PT SHORT TERM GOAL #1   Title  Independent w/ inital HEP     Time  3    Period  Weeks    Status  Achieved        PT Long Term Goals - 03/10/19 1536      PT LONG TERM GOAL #1   Title  Independent w/ advanced HEP.    Time  4    Period  Weeks    Status  Partially Met   met for current   Target Date  04/07/19      PT LONG TERM GOAL #2   Title  Pt. will demonstrate an increase in Lt elbow and wrist AROM to WNL to increase function.    Time  4    Period  Weeks    Status  Partially Met   last measurements of L wrist taken were PROM, not AROM. Patient has acheived normal L elbow AROM, with L wrist flexion/extension slightly limiting   Target Date  04/07/19      PT LONG TERM GOAL #3   Title  Pt. will demonstrate increase in L elbow/wrist strength to 5/5 and Lt grip strength to >40 lbs to allow for increased elbow stability and functional use of arm.    Baseline  L grip with elbow at 90 degrees: 25ppsi, L grip with elbow extended: 22.5 ppsi (average of 3 trials), MMT: grossly 4/5    Time  4    Period  Weeks    Status   Partially Met   L elbow and wrist extension improved, grip strength 11 lbs with elbow supported   Target Date  04/07/19      PT LONG TERM GOAL #4   Title  Patient to report 90% improvement in L elbow pain.    Baseline  Pt reporting ability to do perform ADL's with tolerable pain depending on activity. Pt feels she has definitely improved since beginning therapy.    Time  4    Period  Weeks    Status  Partially Met   reports 80% improvement in pain levels   Target Date  04/07/19      PT LONG TERM GOAL #5   Title  Pt will be able to perform her ususal ADL's and work duties with less than 2/10 overall pain.    Baseline  Pt reporting that most days she is able to perform her ALD's with 2/10 pain but some days are worse.    Time  4    Period  Weeks    Status  Partially Met   reports average of 4/10 pain with vacuuming and lifting groceries   Target Date  04/07/19            Plan - 03/17/19 1611    Clinical Impression Statement  Ashley Franklin reporting she received her home TENS unit and wishes to be instructed don proper use and setup today.  Session focused on instruction on TENS unit electrode placement, parameter setup, unit use and demo with pt.  Ashley Franklin able to demo good overall understanding of TENS unit use after instruction and reference to electrode diagram handout.  Duration of session focused on isolated L wrist strengthening and flexibility.  Will pick back up with work simulation tasks and full hand gripping activities in coming sessions.    Personal Factors and Comorbidities  Comorbidity 3+;Fitness;Past/Current Experience;Profession;Time since onset of injury/illness/exacerbation    Comorbidities  kidney stone, depression, bipolar, anxiety, anemia    Rehab Potential  Good    PT Treatment/Interventions  ADLs/Self  Care Home Management;Cryotherapy;Electrical Stimulation;Moist Heat;Therapeutic exercise;Therapeutic activities;Ultrasound;Neuromuscular re-education;Patient/family  education;Manual techniques;Vasopneumatic Device;Taping;Splinting;Energy conservation;Dry needling;Passive range of motion;Scar mobilization    PT Next Visit Plan  continue to simulate ADL and work activities requiring full-handed grasp, pinch grip activities    PT Home Exercise Plan  lifting, reaching, instructed in taking jar tops or spice jar tops on and off    Consulted and Agree with Plan of Care  Patient       Patient will benefit from skilled therapeutic intervention in order to improve the following deficits and impairments:  Hypomobility, Increased edema, Decreased scar mobility, Decreased activity tolerance, Increased fascial restricitons, Pain, Impaired UE functional use, Improper body mechanics, Decreased range of motion, Impaired flexibility, Postural dysfunction  Visit Diagnosis: Pain in left elbow  Stiffness of left elbow, not elsewhere classified  Stiffness of left wrist, not elsewhere classified  Muscle weakness (generalized)     Problem List Patient Active Problem List   Diagnosis Date Noted  . Right carpal tunnel syndrome 12/09/2018  . Closed nondisplaced fracture of fifth left metatarsal bone 05/10/2018  . History of shingles 01/08/2018  . Menopause 01/08/2018  . Left ankle injury, subsequent encounter 08/04/2017  . PCP NOTES >>>>>>>>>> 07/11/2017  . 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 Chronic knee pain 04/18/2015  . Plantar fasciitis 04/18/2015  . Tobacco use disorder 03/15/2015  . Morbid obesity ([place]) 01/05/2015  . Bipolar affective ([place]) 01/02/2015    Bess Harvest, PTA 03/17/19 6:11 PM   [facility] [address]  [place], [place], [address] Phone: [phone]   Fax:  [phone]  Name: Ashley Franklin MRN: [id number] Date of Birth: April 25, 1971

## 2019-03-17 NOTE — Patient Instructions (Signed)
TENS stands for Transcutaneous Electrical Nerve Stimulation. In other words, electrical impulses are allowed to pass through the skin in order to excite a nerve.  ° °Purpose and Use of TENS:  °TENS is a method used to manage acute and chronic pain without the use of drugs. It has been effective in managing pain associated with surgery, sprains, strains, trauma, rheumatoid arthritis, and neuralgias. It is a non-addictive, low risk, and non-invasive technique used to control pain. It is not, by any means, a curative form of treatment.  ° °How TENS Works:  °Most TENS units are a small pocket-sized unit powered by one 9 volt battery. Attached to the outside of the unit are two lead wires where two pins and/or snaps connect on each wire. All units come with a set of four reusable pads or electrodes. These are placed on the skin surrounding the area involved. By inserting the leads into  the pads, the electricity can pass from the unit making the circuit complete.  °As the intensity is turned up slowly, the electrical current enters the body from the electrodes through the skin to the surrounding nerve fibers. This triggers the release of hormones from within the body. These hormones contain pain relievers. By increasing the circulation of these hormones, the person’s pain may be lessened. It is also believed that the electrical stimulation itself helps to block the pain messages being sent to the brain, thus also decreasing the body’s perception of pain.  ° °Hazards:  °TENS units are NOT to be used by patients with PACEMAKERS, DEFIBRILLATORS, DIABETIC PUMPS, PREGNANT WOMEN, and patients with SEIZURE DISORDERS.  °TENS units are NOT to be used over the heart, throat, brain, or spinal cord.  °One of the major side effects from the TENS unit may be skin irritation. Some people may develop a rash if they are sensitive to the materials used in the electrodes or the connecting wires.  ° °Wear the unit for 15 min.  ° °Avoid  overuse due the body getting used to the stem making it not as effective over time.  ° °

## 2019-03-19 NOTE — Telephone Encounter (Signed)
Mychart message sent to Pt.

## 2019-03-19 NOTE — Telephone Encounter (Signed)
Do not recommend bariatric surgery at this point, until she is cleared regards her syncope and she is out of disability.

## 2019-03-20 ENCOUNTER — Ambulatory Visit: Payer: PRIVATE HEALTH INSURANCE

## 2019-03-20 ENCOUNTER — Other Ambulatory Visit: Payer: Self-pay

## 2019-03-20 DIAGNOSIS — M25632 Stiffness of left wrist, not elsewhere classified: Secondary | ICD-10-CM

## 2019-03-20 DIAGNOSIS — M25522 Pain in left elbow: Secondary | ICD-10-CM | POA: Diagnosis not present

## 2019-03-20 DIAGNOSIS — M25622 Stiffness of left elbow, not elsewhere classified: Secondary | ICD-10-CM

## 2019-03-20 DIAGNOSIS — M6281 Muscle weakness (generalized): Secondary | ICD-10-CM

## 2019-03-20 NOTE — Therapy (Signed)
[facility] [address]  [place], [place], [address] Phone: [phone]   Fax:  [phone]  Physical Therapy Treatment  Patient Details  Name: Ashley Franklin MRN: [id number] Date of Birth: January 30, 1971 Referring Provider (PT): Milly Jakob, MD   Encounter Date: 03/20/2019  PT End of Session - 03/20/19 1546    Visit Number  28    Number of Visits  33    Date for PT Re-Evaluation  04/07/19    Authorization Type  WC: 1 eval + 8 tx + 8 tx +8 tx + 8tx    Authorization - Visit Number  28    Authorization - Number of Visits  33    PT Start Time  1532    PT Stop Time  1610    PT Time Calculation (min)  38 min    Activity Tolerance  Patient tolerated treatment well    Behavior During Therapy  WFL for tasks assessed/performed       Past Medical History:  Diagnosis Date  . Anemia   . Anxiety   . Bipolar affective disorder ([place])   . Blood transfusion without reported diagnosis   . Bronchospasm with bronchitis, acute   . Depression   . Kidney stone   . Right carpal tunnel syndrome 12/09/2018  . SBO (small bowel obstruction) ([place]) 01/2014    Past Surgical History:  Procedure Laterality Date  . CESAREAN SECTION    . ENDOMETRIAL ABLATION    . GASTRIC BYPASS  2001  . SBO  2015    There were no vitals filed for this visit.  Subjective Assessment - 03/20/19 1540    Subjective  Pt. doing well and denies soreness after last session.    Pertinent History  kidney stone, depression, bipolar, anxiety, anemia    Patient Stated Goals  return to work    Currently in Pain?  Yes    Pain Score  2     Pain Location  Elbow    Pain Orientation  Left    Pain Descriptors / Indicators  Aching    Pain Type  Acute pain;Surgical pain    Pain Onset  More than a month ago    Pain Frequency  Intermittent    Multiple Pain Sites  No         [facility] PT Assessment - 03/20/19 0001      Assessment   Medical Diagnosis  s/p L lateral  epicondylitis debridement    Referring Provider (PT)  Milly Jakob, MD    Onset Date/Surgical Date  11/29/18    Next MD Visit  04/03/19                   [facility] Adult PT Treatment/Exercise - 03/20/19 0001      Therapeutic Activites    Therapeutic Activities  Work Simulation    Work Doctor, 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      Elbow Exercises   Forearm Supination  Left;AROM;15 reps    Forearm Supination Limitations  golf club mid grip     Forearm Pronation  Left;15 reps;AROM    Forearm Pronation Limitations  golf club mid grip       Shoulder Exercises: ROM/Strengthening   UBE (Upper Arm Bike)  L2.5 x 3 min forward/3 min backwards    Cybex Row  15 reps    Cybex Row  Limitations  20# - low handles       Wrist Exercises   Wrist Flexion  Left;AROM;10 reps;Bar weights/barbell;Seated    Bar Weights/Barbell (Wrist Flexion)  5 lbs    Wrist Extension  Left;10 reps;AROM;Bar weights/barbell;Seated    Bar Weights/Barbell (Wrist Extension)  5 lbs    Wrist Radial Deviation  Left;10 reps;Bar weights/barbell    Bar Weights/Barbell (Radial Deviation)  4 lbs    Wrist Ulnar Deviation  Left;10 reps;Strengthening;Bar weights/barbell;Seated    Bar Weights/Barbell (Ulnar Deviation)  4 lbs    Other wrist exercises  L wrist extension, flexion stretches 3 x 30 sec each way                PT Short Term Goals - 03/10/19 1536      PT SHORT TERM GOAL #1   Title  Independent w/ inital HEP     Time  3    Period  Weeks    Status  Achieved        PT Long Term Goals - 03/10/19 1536      PT LONG TERM GOAL #1   Title  Independent w/ advanced HEP.    Time  4    Period  Weeks    Status  Partially Met   met for current   Target Date  04/07/19      PT LONG TERM GOAL #2   Title  Pt. will demonstrate an increase in Lt elbow and wrist AROM to WNL to increase function.    Time  4    Period  Weeks    Status   Partially Met   last measurements of L wrist taken were PROM, not AROM. Patient has acheived normal L elbow AROM, with L wrist flexion/extension slightly limiting   Target Date  04/07/19      PT LONG TERM GOAL #3   Title  Pt. will demonstrate increase in L elbow/wrist strength to 5/5 and Lt grip strength to >40 lbs to allow for increased elbow stability and functional use of arm.    Baseline  L grip with elbow at 90 degrees: 25ppsi, L grip with elbow extended: 22.5 ppsi (average of 3 trials), MMT: grossly 4/5    Time  4    Period  Weeks    Status  Partially Met   L elbow and wrist extension improved, grip strength 11 lbs with elbow supported   Target Date  04/07/19      PT LONG TERM GOAL #4   Title  Patient to report 90% improvement in L elbow pain.    Baseline  Pt reporting ability to do perform ADL's with tolerable pain depending on activity. Pt feels she has definitely improved since beginning therapy.    Time  4    Period  Weeks    Status  Partially Met   reports 80% improvement in pain levels   Target Date  04/07/19      PT LONG TERM GOAL #5   Title  Pt will be able to perform her ususal ADL's and work duties with less than 2/10 overall pain.    Baseline  Pt reporting that most days she is able to perform her ALD's with 2/10 pain but some days are worse.    Time  4    Period  Weeks    Status  Partially Met   reports average of 4/10 pain with vacuuming and lifting groceries   Target Date  04/07/19  Plan - 03/20/19 1609    Clinical Impression Statement  Spring doing well today.  Reports average L elbow pain around 2/10 throughout day.  MT addressing ongoing tightness/TPs in L forearm extensor group with some relief noted.  Work simulation progressed to pushing + turning wheeled cart (100lb) in figure-8 pattern x 4 laps to address pt. tolerance for navigating heavy cart at work around pt. rooms for work task.  Pt. demonstrating reduced control of cart and reporting  6/10 pain after ~ 2 min cart push however this did recover after ~ 2 min seated rest break.  Did tolerate progression of grasping hand strengthening and forearm/wrist strengthening activities well today.  Ended session with pt. denying need for modalities.   Comorbidities  kidney stone, depression, bipolar, anxiety, anemia    Rehab Potential  Good    PT Treatment/Interventions  ADLs/Self Care Home Management;Cryotherapy;Electrical Stimulation;Moist Heat;Therapeutic exercise;Therapeutic activities;Ultrasound;Neuromuscular re-education;Patient/family education;Manual techniques;Vasopneumatic Device;Taping;Splinting;Energy conservation;Dry needling;Passive range of motion;Scar mobilization    PT Next Visit Plan  continue to simulate ADL and work activities requiring full-handed grasp, pinch grip activities    PT Home Exercise Plan  lifting, reaching, instructed in taking jar tops or spice jar tops on and off    Consulted and Agree with Plan of Care  Patient       Patient will benefit from skilled therapeutic intervention in order to improve the following deficits and impairments:  Hypomobility, Increased edema, Decreased scar mobility, Decreased activity tolerance, Increased fascial restricitons, Pain, Impaired UE functional use, Improper body mechanics, Decreased range of motion, Impaired flexibility, Postural dysfunction  Visit Diagnosis: Pain in left elbow  Stiffness of left elbow, not elsewhere classified  Stiffness of left wrist, not elsewhere classified  Muscle weakness (generalized)     Problem List Patient Active Problem List   Diagnosis Date Noted  . Right carpal tunnel syndrome 12/09/2018  . Closed nondisplaced fracture of fifth left metatarsal bone 05/10/2018  . History of shingles 01/08/2018  . Menopause 01/08/2018  . Left ankle injury, subsequent encounter 08/04/2017  . PCP NOTES >>>>>>>>>> 07/11/2017  . 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 Chronic knee pain 04/18/2015  . Plantar  fasciitis 04/18/2015  . Tobacco use disorder 03/15/2015  . Morbid obesity ([place]) 01/05/2015  . Bipolar affective ([place]) 01/02/2015    Bess Harvest, PTA 03/20/19 6:31 PM    [facility] [address]  [place], [place], [address] Phone: [phone]   Fax:  [phone]  Name: Ashley Franklin MRN: [id number] Date of Birth: [birth year]

## 2019-03-24 ENCOUNTER — Ambulatory Visit: Payer: PRIVATE HEALTH INSURANCE

## 2019-03-24 ENCOUNTER — Other Ambulatory Visit: Payer: Self-pay

## 2019-03-24 DIAGNOSIS — M25632 Stiffness of left wrist, not elsewhere classified: Secondary | ICD-10-CM

## 2019-03-24 DIAGNOSIS — M25622 Stiffness of left elbow, not elsewhere classified: Secondary | ICD-10-CM

## 2019-03-24 DIAGNOSIS — M6281 Muscle weakness (generalized): Secondary | ICD-10-CM

## 2019-03-24 DIAGNOSIS — M25522 Pain in left elbow: Secondary | ICD-10-CM | POA: Diagnosis not present

## 2019-03-24 NOTE — Therapy (Signed)
[facility] [address]  [place], [place], [address] Phone: [phone]   Fax:  [phone]  Physical Therapy Treatment  Patient Details  Name: Ashley Franklin MRN: [id number] Date of Birth: 09/14/1970 Referring Provider (PT): Milly Jakob, MD   Encounter Date: 03/24/2019  PT End of Session - 03/24/19 1547    Visit Number  29    Number of Visits  33    Date for PT Re-Evaluation  04/07/19    Authorization Type  WC: 1 eval + 8 tx + 8 tx +8 tx + 8tx    Authorization - Visit Number  29    Authorization - Number of Visits  33    PT Start Time  1536    PT Stop Time  1615    PT Time Calculation (min)  39 min    Activity Tolerance  Patient tolerated treatment well    Behavior During Therapy  WFL for tasks assessed/performed       Past Medical History:  Diagnosis Date  . Anemia   . Anxiety   . Bipolar affective disorder ([place])   . Blood transfusion without reported diagnosis   . Bronchospasm with bronchitis, acute   . Depression   . Kidney stone   . Right carpal tunnel syndrome 12/09/2018  . SBO (small bowel obstruction) ([place]) 01/2014    Past Surgical History:  Procedure Laterality Date  . CESAREAN SECTION    . ENDOMETRIAL ABLATION    . GASTRIC BYPASS  2001  . SBO  2015    There were no vitals filed for this visit.  Subjective Assessment - 03/24/19 1546    Subjective  Pt. denies soreness after last session.  Feels she tolerated 100# cart push in session well.    Pertinent History  kidney stone, depression, bipolar, anxiety, anemia    Patient Stated Goals  return to work    Currently in Pain?  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 rooms      Elbow Exercises   Forearm Supination  Left;AROM;15 reps    Forearm Supination Limitations  golf club mid grip     Forearm Pronation  Left;15 reps;AROM    Forearm Pronation Limitations  golf club mid grip       Neck Exercises: Machines for Strengthening   Nustep  Lvl 5, 6 min (UE/LE)      Shoulder Exercises: ROM/Strengthening   Cybex Row  15 reps    Cybex Row Limitations  25# - low handles       Wrist Exercises   Wrist Flexion  Left;AROM;15 reps;Seated;Bar weights/barbell    Bar Weights/Barbell (Wrist Flexion)  5 lbs  Wrist Extension  Left;AROM;15 reps;Bar weights/barbell;Seated    Bar Weights/Barbell (Wrist Extension)  5 lbs    Wrist Radial Deviation  Left;15 reps;Bar weights/barbell    Bar Weights/Barbell (Radial Deviation)  4 lbs    Wrist Ulnar Deviation  Left;15 reps;Strengthening;Bar weights/barbell;Seated    Bar Weights/Barbell (Ulnar Deviation)  4 lbs    Other wrist exercises  L wrist extension, flexion stretches 3 x 30 sec each way                PT Short Term Goals - 03/10/19 1536      PT SHORT TERM GOAL #1   Title  Independent w/ inital HEP     Time  3    Period  Weeks    Status  Achieved        PT Long Term Goals - 03/10/19 1536      PT LONG TERM GOAL #1   Title  Independent w/ advanced HEP.    Time  4    Period  Weeks    Status  Partially Met   met for current   Target Date  04/07/19      PT LONG TERM GOAL #2   Title  Pt. will demonstrate an increase in Lt elbow and wrist AROM to WNL to increase function.    Time  4    Period  Weeks    Status  Partially Met   last measurements of L wrist taken were PROM, not AROM. Patient has acheived normal L elbow AROM, with L wrist  flexion/extension slightly limiting   Target Date  04/07/19      PT LONG TERM GOAL #3   Title  Pt. will demonstrate increase in L elbow/wrist strength to 5/5 and Lt grip strength to >40 lbs to allow for increased elbow stability and functional use of arm.    Baseline  L grip with elbow at 90 degrees: 25ppsi, L grip with elbow extended: 22.5 ppsi (average of 3 trials), MMT: grossly 4/5    Time  4    Period  Weeks    Status  Partially Met   L elbow and wrist extension improved, grip strength 11 lbs with elbow supported   Target Date  04/07/19      PT LONG TERM GOAL #4   Title  Patient to report 90% improvement in L elbow pain.    Baseline  Pt reporting ability to do perform ADL's with tolerable pain depending on activity. Pt feels she has definitely improved since beginning therapy.    Time  4    Period  Weeks    Status  Partially Met   reports 80% improvement in pain levels   Target Date  04/07/19      PT LONG TERM GOAL #5   Title  Pt will be able to perform her ususal ADL's and work duties with less than 2/10 overall pain.    Baseline  Pt reporting that most days she is able to perform her ALD's with 2/10 pain but some days are worse.    Time  4    Period  Weeks    Status  Partially Met   reports average of 4/10 pain with vacuuming and lifting groceries   Target Date  04/07/19            Plan - 03/24/19 1552    Clinical Impression Statement  Spring denied soreness after last session thus progressed work-related task simulation 100lb cart pushing/turning/pivoting to simulate  navigating pt. rooms.  Pt. reporting L elbow pain up to 6/10 after ~ 5 min of this activity which requires a few minutes to recover to baseline.  Pt. still with primary complaint in L elbow extensors group however tolerated progression of therex resistance well today.  L grip strength somewhat improved with MMT today along with L wrist extensor strength.  Pt. progressing toward LTG #3.    Comorbidities   kidney stone, depression, bipolar, anxiety, anemia    Rehab Potential  Good    PT Treatment/Interventions  ADLs/Self Care Home Management;Cryotherapy;Electrical Stimulation;Moist Heat;Therapeutic exercise;Therapeutic activities;Ultrasound;Neuromuscular re-education;Patient/family education;Manual techniques;Vasopneumatic Device;Taping;Splinting;Energy conservation;Dry needling;Passive range of motion;Scar mobilization    PT Next Visit Plan  continue to simulate ADL and work activities requiring full-handed grasp, pinch grip activities    PT Home Exercise Plan  lifting, reaching, instructed in taking jar tops or spice jar tops on and off; pt. issued green theraputty for 3-point pinch exercises    Consulted and Agree with Plan of Care  Patient       Patient will benefit from skilled therapeutic intervention in order to improve the following deficits and impairments:  Hypomobility, Increased edema, Decreased scar mobility, Decreased activity tolerance, Increased fascial restricitons, Pain, Impaired UE functional use, Improper body mechanics, Decreased range of motion, Impaired flexibility, Postural dysfunction  Visit Diagnosis: Pain in left elbow  Stiffness of left elbow, not elsewhere classified  Stiffness of left wrist, not elsewhere classified  Muscle weakness (generalized)     Problem List Patient Active Problem List   Diagnosis Date Noted  . Right carpal tunnel syndrome 12/09/2018  . Closed nondisplaced fracture of fifth left metatarsal bone 05/10/2018  . History of shingles 01/08/2018  . Menopause 01/08/2018  . Left ankle injury, subsequent encounter 08/04/2017  . PCP NOTES >>>>>>>>>> 07/11/2017  . 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 Chronic knee pain 04/18/2015  . Plantar fasciitis 04/18/2015  . Tobacco use disorder 03/15/2015  . Morbid obesity ([place]) 01/05/2015  . Bipolar affective ([place]) 01/02/2015    Bess Harvest, PTA 03/24/19 5:50 PM   [facility] [address]  [place], [place], [address] Phone: [phone]   Fax:  [phone]  Name: Ashley Franklin MRN: [id number] Date of Birth: [birth year]

## 2019-03-25 ENCOUNTER — Ambulatory Visit: Payer: No Typology Code available for payment source | Admitting: Psychology

## 2019-03-27 ENCOUNTER — Telehealth: Payer: Self-pay | Admitting: Neurology

## 2019-03-27 ENCOUNTER — Other Ambulatory Visit: Payer: Self-pay

## 2019-03-27 ENCOUNTER — Encounter: Payer: Self-pay | Admitting: Physical Therapy

## 2019-03-27 ENCOUNTER — Ambulatory Visit: Payer: PRIVATE HEALTH INSURANCE | Admitting: Physical Therapy

## 2019-03-27 ENCOUNTER — Institutional Professional Consult (permissible substitution): Payer: Self-pay | Admitting: Neurology

## 2019-03-27 DIAGNOSIS — M25632 Stiffness of left wrist, not elsewhere classified: Secondary | ICD-10-CM

## 2019-03-27 DIAGNOSIS — M25522 Pain in left elbow: Secondary | ICD-10-CM

## 2019-03-27 DIAGNOSIS — M6281 Muscle weakness (generalized): Secondary | ICD-10-CM

## 2019-03-27 DIAGNOSIS — M25622 Stiffness of left elbow, not elsewhere classified: Secondary | ICD-10-CM

## 2019-03-27 NOTE — Telephone Encounter (Signed)
Pt has called back and accepted 11-18 with a check in of 1:00 for 1:30 consult with Dr Rexene Alberts

## 2019-03-27 NOTE — Telephone Encounter (Signed)
Called the patient to inform her that the MD is running late coming into work this morning due to car trouble. LVM asking the patient to call to be rescheduled. Pt must be rescheduled with Dr Rexene Alberts for this sleep consult.

## 2019-03-27 NOTE — Therapy (Signed)
[facility] [address]  [place], [place], [address] Phone: [phone]   Fax:  [phone]  Physical Therapy Progress Note  Patient Details  Name: Ashley Franklin MRN: [id number] Date of Birth: November 28, 1970 Referring Provider (PT): Milly Jakob, MD   Encounter Date: 03/27/2019  PT End of Session - 03/27/19 1611    Visit Number  30    Number of Visits  41    Date for PT Re-Evaluation  05/01/19    Authorization Type  WC: 1 eval + 8 tx + 8 tx +8 tx + 8tx    Authorization - Visit Number  76    Authorization - Number of Visits  33    PT Start Time  1524    PT Stop Time  1609    PT Time Calculation (min)  45 min    Activity Tolerance  Patient tolerated treatment well;Patient limited by pain    Behavior During Therapy  [facility] for tasks assessed/performed       Past Medical History:  Diagnosis Date  . Anemia   . Anxiety   . Bipolar affective disorder ([place])   . Blood transfusion without reported diagnosis   . Bronchospasm with bronchitis, acute   . Depression   . Kidney stone   . Right carpal tunnel syndrome 12/09/2018  . SBO (small bowel obstruction) ([place]) 01/2014    Past Surgical History:  Procedure Laterality Date  . CESAREAN SECTION    . ENDOMETRIAL ABLATION    . GASTRIC BYPASS  2001  . SBO  2015    There were no vitals filed for this visit.  Subjective Assessment - 03/27/19 1527    Subjective  Screwed up by pushing herself too hard yesterday. Pushed a grocery cart with heavy cat litter which she feels flared herself up- used TENS unit last night.    Pertinent History  kidney stone, depression, bipolar, anxiety, anemia    Patient Stated Goals  return to work    Currently in Pain?  Yes    Pain Score  4     Pain Location  Elbow    Pain Orientation  Left    Pain Descriptors / Indicators  Aching    Pain Type  Acute pain;Surgical pain         [facility] PT Assessment - 03/27/19 0001      Assessment   Medical Diagnosis  s/p L lateral epicondylitis debridement    Referring Provider (PT)  Milly Jakob, MD    Onset Date/Surgical Date  11/29/18      Observation/Other Assessments   Focus on Therapeutic Outcomes (FOTO)   43% limitation      AROM   Left Elbow Flexion  143    Left Elbow Extension  -1    Left Wrist Extension  73 Degrees    Left Wrist Flexion  75 Degrees    Left Wrist Radial Deviation  19 Degrees    Left Wrist Ulnar Deviation  42 Degrees      Strength   Left Elbow Flexion  4+/5    Left Elbow Extension  4+/5    Left Wrist Flexion  4+/5    Left Wrist Extension  4+/5    Left Wrist Radial Deviation  4+/5    Left Wrist Ulnar Deviation  4+/5                   [facility] Adult PT Treatment/Exercise - 03/27/19 0001  Shoulder Exercises: ROM/Strengthening   UBE (Upper Arm Bike)  L2.5 x 3 min forward/3 min backwards      Hand Exercises   Other Hand Exercises  L full hand grip on end of 5# dumbbell + lift to counter top and filing cabinet x5   5x 5#, 5x 4#     Wrist Exercises   Wrist Radial Deviation  Left;10 reps    Wrist Radial Deviation Limitations  simulating lifting pot from counter with grip on end of hammer   10x w/ hammer; 10x with hammer + 1# ankle wt            PT Education - 03/27/19 1610    Education Details  update to HEP- large grip on 4# object, lifting to counter top 2x5 reps daily; discussion on exercise modification as needed d/t pain; discussion on objective progress and remaining limitations    Person(s) Educated  Patient    Methods  Explanation;Demonstration;Tactile cues;Verbal cues;Handout    Comprehension  Verbalized understanding;Returned demonstration       PT Short Term Goals - 03/27/19 1530      PT SHORT TERM GOAL #1   Title  Independent w/ inital HEP     Time  3    Period  Weeks    Status  Achieved        PT Long Term Goals - 03/27/19 1531      PT LONG TERM GOAL #1   Title  Independent w/ advanced HEP.    Time   4    Period  Weeks    Status  Partially Met   met for current   Target Date  05/01/19      PT LONG TERM GOAL #2   Title  Pt. will demonstrate an increase in Lt elbow and wrist AROM to WNL to increase function.    Time  4    Period  Weeks    Status  Partially Met   remaining limitations in L wrist flexion, extension, and radial deviation   Target Date  05/01/19      PT LONG TERM GOAL #3   Title  Pt. will demonstrate increase in L elbow/wrist strength to 5/5 and Lt grip strength to >40 lbs to allow for increased elbow stability and functional use of arm.    Baseline  L grip with elbow at 90 degrees: 25ppsi, L grip with elbow extended: 22.5 ppsi (average of 3 trials), MMT: grossly 4/5    Time  4    Period  Weeks    Status  Partially Met   strength 4+/5, however scoring 13.66 lbs in grip strength   Target Date  05/01/19      PT LONG TERM GOAL #4   Title  Patient to report 90% improvement in L elbow pain.    Baseline  Pt reporting ability to do perform ADL's with tolerable pain depending on activity. Pt feels she has definitely improved since beginning therapy.    Time  4    Period  Weeks    Status  Partially Met   reports 80% improvement in pain levels   Target Date  05/01/19      PT LONG TERM GOAL #5   Title  Pt will be able to perform her ususal ADL's and work duties with less than 2/10 overall pain.    Baseline  Pt reporting that most days she is able to perform her ALD's with 2/10 pain but some days are worse.  Time  4    Period  Weeks    Status  Partially Met   reports up to 6/10 pain in L elbow with doing laundry, lifting pots/pans   Target Date  05/01/19            Plan - 03/27/19 1622    Clinical Impression Statement  Patient reporting 80% improvement in pain levels thus far. Noting that she is still struggling with activities like lifting pots/pans, lifting and transferring laundry from the washer/dryer, lifting and throwing a pillow. ADLs still elicit pain up  to 4/88 at worst. Patient has met goal for L elbow ROM, with remaining limitations in L wrist flexion, extension, and radial deviation. Strength is grossly 4+/5, with remaining limitation in full grip strength despite slight improvement towards her goal today. Worked on simulation activities that patient has trouble with today. Patient able to lift 4lbs with full-handed grip with report of challenge but good tolerance. Did report that 5lbs felt too much for her. Simulated lifting pan off of counter top with visible muscle fatigue and shaking at end reps, with cues to take break before completing her set. Updated HEP with addition of lifting 4lb object to counter with patient reporting understanding. Patient is showing slow but steady improvement. At this time, it appears that grip strength and pain are patient's most limiting factors. Would benefit from additional skilled PT services 2x/week for 4 weeks in addition to current visits to return to PLOF.    Comorbidities  kidney stone, depression, bipolar, anxiety, anemia    Rehab Potential  Good    PT Frequency  2x / week    PT Duration  4 weeks    PT Treatment/Interventions  ADLs/Self Care Home Management;Cryotherapy;Electrical Stimulation;Moist Heat;Therapeutic exercise;Therapeutic activities;Ultrasound;Neuromuscular re-education;Patient/family education;Manual techniques;Vasopneumatic Device;Taping;Splinting;Energy conservation;Dry needling;Passive range of motion;Scar mobilization    PT Next Visit Plan  continue to simulate ADL and work activities requiring full-handed grasp, pinch grip activities    PT Home Exercise Plan  lifting, reaching, instructed in taking jar tops or spice jar tops on and off; pt. issued green theraputty for 3-point pinch exercises    Consulted and Agree with Plan of Care  Patient       Patient will benefit from skilled therapeutic intervention in order to improve the following deficits and impairments:  Hypomobility, Increased  edema, Decreased scar mobility, Decreased activity tolerance, Increased fascial restricitons, Pain, Impaired UE functional use, Improper body mechanics, Decreased range of motion, Impaired flexibility, Postural dysfunction  Visit Diagnosis: Pain in left elbow  Stiffness of left elbow, not elsewhere classified  Stiffness of left wrist, not elsewhere classified  Muscle weakness (generalized)     Problem List Patient Active Problem List   Diagnosis Date Noted  . Right carpal tunnel syndrome 12/09/2018  . Closed nondisplaced fracture of fifth left metatarsal bone 05/10/2018  . History of shingles 01/08/2018  . Menopause 01/08/2018  . Left ankle injury, subsequent encounter 08/04/2017  . PCP NOTES >>>>>>>>>> 07/11/2017  . 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 Chronic knee pain 04/18/2015  . Plantar fasciitis 04/18/2015  . Tobacco use disorder 03/15/2015  . Morbid obesity ([place]) 01/05/2015  . Bipolar affective ([place]) 01/02/2015     Janene Harvey, PT, DPT 03/27/19 5:23 PM   [facility] [address]  [place], [place], [address] Phone: [phone]   Fax:  [phone]  Name: JIN SHOCKLEY MRN: [id number] Date of Birth: 06/07/1970

## 2019-03-31 ENCOUNTER — Other Ambulatory Visit: Payer: Self-pay

## 2019-03-31 ENCOUNTER — Ambulatory Visit: Payer: PRIVATE HEALTH INSURANCE | Admitting: Physical Therapy

## 2019-03-31 ENCOUNTER — Encounter: Payer: Self-pay | Admitting: Physical Therapy

## 2019-03-31 DIAGNOSIS — M25632 Stiffness of left wrist, not elsewhere classified: Secondary | ICD-10-CM

## 2019-03-31 DIAGNOSIS — M25622 Stiffness of left elbow, not elsewhere classified: Secondary | ICD-10-CM

## 2019-03-31 DIAGNOSIS — M25522 Pain in left elbow: Secondary | ICD-10-CM

## 2019-03-31 DIAGNOSIS — S5002XD Contusion of left elbow, subsequent encounter: Secondary | ICD-10-CM

## 2019-03-31 DIAGNOSIS — M6281 Muscle weakness (generalized): Secondary | ICD-10-CM

## 2019-03-31 NOTE — Therapy (Signed)
[facility] [address]  [place], [place], [address] Phone: [phone]   Fax:  [phone]  Physical Therapy Treatment  Patient Details  Name: Ashley Franklin MRN: [id number] Date of Birth: 08-04-1970 Referring Provider (PT): Milly Jakob, MD   Encounter Date: 03/31/2019  PT End of Session - 03/31/19 1627    Visit Number  31    Number of Visits  41    Date for PT Re-Evaluation  05/01/19    Authorization Type  WC: 1 eval + 8 tx + 8 tx +8 tx + 8tx    Authorization Time Period  progress note sent on 02/19/2019 (20th visit)    Authorization - Visit Number  52    Authorization - Number of Visits  33    PT Start Time  1530    PT Stop Time  1610    PT Time Calculation (min)  40 min    Equipment Utilized During Treatment  Other (comment)    Activity Tolerance  Patient tolerated treatment well;Patient limited by pain    Behavior During Therapy  [facility] for tasks assessed/performed       Past Medical History:  Diagnosis Date  . Anemia   . Anxiety   . Bipolar affective disorder ([place])   . Blood transfusion without reported diagnosis   . Bronchospasm with bronchitis, acute   . Depression   . Kidney stone   . Right carpal tunnel syndrome 12/09/2018  . SBO (small bowel obstruction) ([place]) 01/2014    Past Surgical History:  Procedure Laterality Date  . CESAREAN SECTION    . ENDOMETRIAL ABLATION    . GASTRIC BYPASS  2001  . SBO  2015    There were no vitals filed for this visit.  Subjective Assessment - 03/31/19 1625    Subjective  Pt arriving today to therapy reporting still having trouble with lifting her pots and pans from a below counter to the stove top with control. Pt also still reporting pain with laundry and turning to open certain canisters.    Pertinent History  kidney stone, depression, bipolar, anxiety, anemia    Limitations  Lifting;House hold activities    Patient Stated Goals  return to work    Currently in Pain?  Yes    Pain Score  4     Pain Location  Elbow    Pain Orientation  Left    Pain Descriptors / Indicators  Aching    Pain Type  Acute pain;Surgical pain    Pain Onset  More than a month ago                       [facility] Adult PT Treatment/Exercise - 03/31/19 0001      Hand Exercises   Other Hand Exercises  L full hand grip on end of 5# dumbbell + lift to counter top and filing cabinet x5   5# x 10 reps     Wrist Exercises   Wrist Radial Deviation  Left;10 reps    Wrist Radial Deviation Limitations  simulating lifting pan/pots using hammer with 1# weight attached to the end    Wrist Ulnar Deviation  Left;10 reps    Wrist Ulnar Deviation Limitations  simulating lowering pots/pans using a hammer with 1# weight attached to the end    Other wrist exercises  simulating lifting a 10# bag of groceries from hanging at pt's side with elbow extended to placing  on the counter height x 10 reps with increasd pain noted to lateral elbow.       Acupuncturist Location  Pt reported she would use her TENS unit when she got home.                PT Short Term Goals - 03/31/19 1633      PT SHORT TERM GOAL #1   Title  Independent w/ inital HEP     Time  3    Period  Weeks    Status  Achieved    Target Date  12/31/18        PT Long Term Goals - 03/31/19 1633      PT LONG TERM GOAL #1   Title  Independent w/ advanced HEP.    Time  4    Period  Weeks    Status  Partially Met      PT LONG TERM GOAL #2   Title  Pt. will demonstrate an increase in Lt elbow and wrist AROM to WNL to increase function.    Time  4    Period  Weeks    Status  Partially Met      PT LONG TERM GOAL #3   Title  Pt. will demonstrate increase in L elbow/wrist strength to 5/5 and Lt grip strength to >40 lbs to allow for increased elbow stability and functional use of arm.    Baseline  L grip with elbow at 90 degrees: 25ppsi, L grip with elbow  extended: 22.5 ppsi (average of 3 trials), MMT: grossly 4/5    Period  Weeks    Status  Partially Met      PT LONG TERM GOAL #4   Title  Patient to report 90% improvement in L elbow pain.    Baseline  Pt reporting ability to do perform ADL's with tolerable pain depending on activity. Pt feels she has definitely improved since beginning therapy.    Period  Weeks    Status  Partially Met      PT LONG TERM GOAL #5   Title  Pt will be able to perform her ususal ADL's and work duties with less than 2/10 overall pain.    Baseline  Pt reporting that most days she is able to perform her ALD's with 2/10 pain but some days are worse.    Time  4    Period  Weeks    Status  Partially Met            Plan - 03/31/19 1628    Clinical Impression Statement  Pt tolerating exercises with pain reported as pt was lifting 10 pound weight from her side to the counter height. We simulated cooking, mixing, pouring from a bowl with 1 # weight inside, we broke up placing 5# weight using different grips from counter top to above shelf. Pt reporting pain in lateral elblow with each exercise. Pt tolerating Manual therapy reporting feeling better at end of session with pain reported at 2/10. Pt is making gradual progress toward goals set and her prior level of functioning. Continue with skilled PT.    Personal Factors and Comorbidities  Comorbidity 3+;Fitness;Past/Current Experience;Profession;Time since onset of injury/illness/exacerbation    Comorbidities  kidney stone, depression, bipolar, anxiety, anemia    Examination-Activity Limitations  Caring for Others;Carry;Hygiene/Grooming;Lift    Examination-Participation Restrictions  Cleaning;Shop;Driving;Yard Work;Interpersonal Relationship;Laundry;Meal Prep    Stability/Clinical Decision Making  Stable/Uncomplicated    Rehab Potential  Good    PT Frequency  2x / week    PT Duration  4 weeks    PT Treatment/Interventions  ADLs/Self Care Home  Management;Cryotherapy;Electrical Stimulation;Moist Heat;Therapeutic exercise;Therapeutic activities;Ultrasound;Neuromuscular re-education;Patient/family education;Manual techniques;Vasopneumatic Device;Taping;Splinting;Energy conservation;Dry needling;Passive range of motion;Scar mobilization    PT Next Visit Plan  continue to simulate ADL and work activities requiring full-handed grasp, pinch grip activities    PT Home Exercise Plan  lifting, reaching, instructed in taking jar tops or spice jar tops on and off; pt. issued green theraputty for 3-point pinch exercises    Consulted and Agree with Plan of Care  Patient       Patient will benefit from skilled therapeutic intervention in order to improve the following deficits and impairments:  Hypomobility, Increased edema, Decreased scar mobility, Decreased activity tolerance, Increased fascial restricitons, Pain, Impaired UE functional use, Improper body mechanics, Decreased range of motion, Impaired flexibility, Postural dysfunction  Visit Diagnosis: Pain in left elbow  Stiffness of left elbow, not elsewhere classified  Stiffness of left wrist, not elsewhere classified  Muscle weakness (generalized)  Contusion of left elbow, subsequent encounter     Problem List Patient Active Problem List   Diagnosis Date Noted  . Right carpal tunnel syndrome 12/09/2018  . Closed nondisplaced fracture of fifth left metatarsal bone 05/10/2018  . History of shingles 01/08/2018  . Menopause 01/08/2018  . Left ankle injury, subsequent encounter 08/04/2017  . PCP NOTES >>>>>>>>>> 07/11/2017  . 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 Chronic knee pain 04/18/2015  . Plantar fasciitis 04/18/2015  . Tobacco use disorder 03/15/2015  . Morbid obesity ([place]) 01/05/2015  . Bipolar affective ([place]) 01/02/2015    Oretha Caprice, PT 03/31/2019, 4:41 PM  [facility] [address]  [place], [place],  [address] Phone: [phone]   Fax:  [phone]  Name: ASTRA GREGG MRN: [id number] Date of Birth: August 08, 1970

## 2019-04-01 ENCOUNTER — Telehealth: Payer: Self-pay

## 2019-04-01 DIAGNOSIS — Z0279 Encounter for issue of other medical certificate: Secondary | ICD-10-CM

## 2019-04-01 NOTE — Telephone Encounter (Signed)
Received fax confirmation

## 2019-04-01 NOTE — Telephone Encounter (Signed)
Received forms from [employer] for disability extension. Forms completed- awaiting signature from PCP.

## 2019-04-01 NOTE — Telephone Encounter (Signed)
Forms faxed back to [employer] at [phone]. Forms sent for scanning.

## 2019-04-02 ENCOUNTER — Ambulatory Visit: Payer: No Typology Code available for payment source | Admitting: Neurology

## 2019-04-02 ENCOUNTER — Encounter: Payer: Self-pay | Admitting: Neurology

## 2019-04-02 ENCOUNTER — Other Ambulatory Visit: Payer: Self-pay

## 2019-04-02 VITALS — BP 135/78 | HR 68 | Temp 96.9°F | Ht 64.0 in | Wt 259.0 lb

## 2019-04-02 DIAGNOSIS — Z5181 Encounter for therapeutic drug level monitoring: Secondary | ICD-10-CM

## 2019-04-02 DIAGNOSIS — G4710 Hypersomnia, unspecified: Secondary | ICD-10-CM | POA: Diagnosis not present

## 2019-04-02 DIAGNOSIS — G4733 Obstructive sleep apnea (adult) (pediatric): Secondary | ICD-10-CM

## 2019-04-02 DIAGNOSIS — Z9989 Dependence on other enabling machines and devices: Secondary | ICD-10-CM | POA: Diagnosis not present

## 2019-04-02 MED FILL — GABAPENTIN 300 MG CAPSULE: 300 | 30 days supply | Qty: 30 | Fill #2
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
## 2019-04-02 NOTE — Patient Instructions (Signed)
Your history is not very suggestive for narcolepsy.  I do believe that you may be sleepy during the day from taking several potentially sedating medications.  I would be happy to check your sleep with an extended sleep study including a nighttime and a daytime sleep study.  As discussed, you would have to taper off of your psychotropic medications, in particular, your antidepressant medication, Depakote, Lamictal, and Latuda.I do understand that this is currently not possible.  Please keep in close follow-up with your psychiatrist.  If it is feasible for you to taper off your medications and stay off of them for 2 weeks I would be happy to schedule sleep study testing.  Please follow-up with Dr. Jannifer Franklin as scheduled. Please follow-up in 3 months with one of our nurse practitioners for sleep apnea.  We will increase your minimum pressure to 7 cm to make sure you tolerate autoPAP a little better. Please do not drive when you feel sleepy.

## 2019-04-02 NOTE — Progress Notes (Signed)
Order for auto pap pressure change sent to [facility] via community message. Confirmation received that the order transmitted was successful.

## 2019-04-03 ENCOUNTER — Ambulatory Visit: Payer: PRIVATE HEALTH INSURANCE

## 2019-04-07 ENCOUNTER — Ambulatory Visit: Payer: PRIVATE HEALTH INSURANCE

## 2019-04-07 ENCOUNTER — Telehealth: Payer: Self-pay | Admitting: Neurology

## 2019-04-07 ENCOUNTER — Other Ambulatory Visit: Payer: Self-pay

## 2019-04-07 DIAGNOSIS — M6281 Muscle weakness (generalized): Secondary | ICD-10-CM

## 2019-04-07 DIAGNOSIS — S5002XD Contusion of left elbow, subsequent encounter: Secondary | ICD-10-CM

## 2019-04-07 DIAGNOSIS — M25522 Pain in left elbow: Secondary | ICD-10-CM

## 2019-04-07 DIAGNOSIS — M25622 Stiffness of left elbow, not elsewhere classified: Secondary | ICD-10-CM

## 2019-04-07 DIAGNOSIS — M25632 Stiffness of left wrist, not elsewhere classified: Secondary | ICD-10-CM

## 2019-04-07 NOTE — Telephone Encounter (Signed)
I called Markham Jordan [phone] with [employer]. She stated the PCP is doing pts fmla and disability. She has receive Dr. Jannifer Franklin last office notes from 10./2020 visit. They need to know if the pts valium toxicity cause her to have the syncope and collapse episode. Also is the pt able to work or does she have any work restrictions. Also from a neurological aspect what are his findings about her work capability. I stated message will be sent to Dr.Willis for review.
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
## 2019-04-07 NOTE — Telephone Encounter (Signed)
Ashley Franklin with [employer] called to speak to someone in regards to the patients disability. Please follow up.

## 2019-04-07 NOTE — Telephone Encounter (Signed)
I called and talked with the disability agent, the patient is undergoing a work-up, she is having episodes of what sound like sudden onset sleep problems, the patient was on sedative medications, but now still complains of oversedation and frequent sleep episodes off of sedative medications.  She is undergoing a sleep evaluation to rule out other problems such as narcolepsy, she does have sleep apnea on CPAP.  The patient works as a Charity fundraiser.  She certainly should not be operating a motor vehicle until this issue gets figured out, otherwise no physical restrictions.

## 2019-04-08 ENCOUNTER — Telehealth: Payer: Self-pay

## 2019-04-08 NOTE — Telephone Encounter (Signed)
Copied from [place] [id number]. Topic: General - Inquiry >> Apr 08, 2019  2:39 PM Richardo Priest, [place] wrote: Reason for CRM: Ms.Garner called in asking is PCP was going to take patient out of work for disability. They would like to know if PCP will take her out, the reason for being taken out of work, to see what patient can and cannot do, and estimated return back to work time. Please advise and call back is [phone]. Claim number for patient is [id number]

## 2019-04-08 NOTE — Telephone Encounter (Deleted)
I have completed paperwork already and faxed on 04/01/2019 w/ fax confirmation w/ TBD expected return to work date.

## 2019-04-08 NOTE — Telephone Encounter (Signed)
Spoke w/ Kennyth Lose at [employer]- she informed that she spoke w/ Dr. Rexene Alberts and Dr. Jannifer Franklin- they informed that they both believed that Pt's symptoms are d/t over sedation- Dr. Rexene Alberts recommended sleep study but would not be able to do this with her on psychotropic meds- she would have to stop for at least 2 weeks. Pt's psychiatrist did not want her coming off meds. Kennyth Lose wanted to know next steps if they are any- at this time- they do not believe there is enough information for Pt to continue claim. She requests to speak w/ Dr. Larose Kells further if possible. She can be reached at [phone].

## 2019-04-09 ENCOUNTER — Encounter: Payer: Self-pay | Admitting: Physical Therapy

## 2019-04-09 ENCOUNTER — Ambulatory Visit: Payer: PRIVATE HEALTH INSURANCE | Admitting: Physical Therapy

## 2019-04-09 ENCOUNTER — Other Ambulatory Visit: Payer: Self-pay

## 2019-04-09 DIAGNOSIS — M25522 Pain in left elbow: Secondary | ICD-10-CM

## 2019-04-09 DIAGNOSIS — M6281 Muscle weakness (generalized): Secondary | ICD-10-CM

## 2019-04-09 DIAGNOSIS — M25622 Stiffness of left elbow, not elsewhere classified: Secondary | ICD-10-CM

## 2019-04-09 DIAGNOSIS — M25632 Stiffness of left wrist, not elsewhere classified: Secondary | ICD-10-CM

## 2019-04-09 NOTE — Telephone Encounter (Signed)
LMOM for pt.  

## 2019-04-09 NOTE — Telephone Encounter (Signed)
I spoke first with the patient: She confirms to me that her neurologist likes to proceed with a sleep study however her psychiatrist is opposed to stop the medication she takes before the study.  I spoke with Kennyth Lose at the [facility], explained the situation, going forward any extension of her disability will have to come from Dr. Modesta Messing, her psychiatrist.  (Also, according to the patient her disability is approved until 05/15/2019  but Kennyth Lose from the [place] state is only approved till March 05, 2019)

## 2019-04-09 NOTE — Telephone Encounter (Signed)
Pt returning call. Attempted to reach office 3x.Please advise.  °

## 2019-04-09 NOTE — Therapy (Signed)
[facility] [address]  [place], [place], [address] Phone: [phone]   Fax:  [phone]  Physical Therapy Treatment  Patient Details  Name: Ashley Franklin MRN: [id number] Date of Birth: [birth year] Referring Provider (PT): Milly Jakob, MD   Encounter Date: 04/09/2019  PT End of Session - 04/09/19 1612    Visit Number  33    Number of Visits  41    Date for PT Re-Evaluation  05/01/19    Authorization Type  WC: 1 eval + 8 tx + 8 tx +8 tx + 8tx    Authorization Time Period  progress note sent on 02/19/2019 (20th visit)    Authorization - Visit Number  33    Authorization - Number of Visits  33    PT Start Time  1527    PT Stop Time  1610    PT Time Calculation (min)  43 min    Activity Tolerance  Patient tolerated treatment well;Patient limited by pain    Behavior During Therapy  [facility] for tasks assessed/performed       Past Medical History:  Diagnosis Date  . Anemia   . Anxiety   . Bipolar affective disorder ([place])   . Blood transfusion without reported diagnosis   . Bronchospasm with bronchitis, acute   . Depression   . Kidney stone   . Right carpal tunnel syndrome 12/09/2018  . SBO (small bowel obstruction) ([place]) 01/2014    Past Surgical History:  Procedure Laterality Date  . CESAREAN SECTION    . ENDOMETRIAL ABLATION    . GASTRIC BYPASS  2001  . SBO  2015    There were no vitals filed for this visit.  Subjective Assessment - 04/09/19 1528    Subjective  Doing well today. Injection has helped considerably with her pain. The electrical pain has gone away and has allowed her to work more on strength training. Still has some remianing weakness with lifting and pushing.    Pertinent History  kidney stone, depression, bipolar, anxiety, anemia    Patient Stated Goals  return to work    Currently in Pain?  No/denies                       [facility] Adult PT Treatment/Exercise - 04/09/19  0001      Elbow Exercises   Elbow Extension  Strengthening;Both;10 reps;Standing    Elbow Extension Limitations  B triceps extensions with 10# ans bath towel grip   4/10 pain     Shoulder Exercises: Standing   Row  Strengthening;Both;10 reps;Weights    Row Weight (lbs)  15#    Row Limitations  2x10; bath towel grip       Shoulder Exercises: ROM/Strengthening   UBE (Upper Arm Bike)  L2.5 x 3 min forward/3 min backwards      Hand Exercises   Other Hand Exercises  L full hand grip holding canister with 4# in it to counter top and filing cabinet 2x5; 0# x5   2/10 pain; unable to perform last rep   Other Hand Exercises  10# dumbbell towel carry 161f   no pain or difficulty     Wrist Exercises   Other wrist exercises  L wrist extensor, flexor stretch x 30 sec       Manual Therapy   Manual Therapy  Soft tissue mobilization;Myofascial release    Manual therapy comments  sitting    Soft tissue  mobilization  STM to L wrist extensors, wrist flexors, pronator teres- palpable painful trigger points evident throughout    Myofascial Release  manual TPR to L lateral wrist extensors, wrist flexors -tender trigger pts with good relief of pain and soft tissue restriction               PT Short Term Goals - 03/31/19 1633      PT SHORT TERM GOAL #1   Title  Independent w/ inital HEP     Time  3    Period  Weeks    Status  Achieved    Target Date  12/31/18        PT Long Term Goals - 03/31/19 1633      PT LONG TERM GOAL #1   Title  Independent w/ advanced HEP.    Time  4    Period  Weeks    Status  Partially Met      PT LONG TERM GOAL #2   Title  Pt. will demonstrate an increase in Lt elbow and wrist AROM to WNL to increase function.    Time  4    Period  Weeks    Status  Partially Met      PT LONG TERM GOAL #3   Title  Pt. will demonstrate increase in L elbow/wrist strength to 5/5 and Lt grip strength to >40 lbs to allow for increased elbow stability and functional use  of arm.    Baseline  L grip with elbow at 90 degrees: 25ppsi, L grip with elbow extended: 22.5 ppsi (average of 3 trials), MMT: grossly 4/5    Period  Weeks    Status  Partially Met      PT LONG TERM GOAL #4   Title  Patient to report 90% improvement in L elbow pain.    Baseline  Pt reporting ability to do perform ADL's with tolerable pain depending on activity. Pt feels she has definitely improved since beginning therapy.    Period  Weeks    Status  Partially Met      PT LONG TERM GOAL #5   Title  Pt will be able to perform her ususal ADL's and work duties with less than 2/10 overall pain.    Baseline  Pt reporting that most days she is able to perform her ALD's with 2/10 pain but some days are worse.    Time  4    Period  Weeks    Status  Partially Met            Plan - 04/09/19 1613    Clinical Impression Statement  Patient reporting continued improvement in L elbow pain since receiving injection. Worked on full handed-grip lifting canister to counter top. Patient did report pain in L lateral epicondyle with this activity, thus decreased weight. Continued to work on full handed grasp weight training with patient tolerating dumbbell hold well, but with more difficulty with triceps extensions. Ended session with STM and TPR to L lateral wrist extensors and wrist flexors. Patient demonstrated tender trigger points but  with good relief of pain and soft tissue restriction with manual therapy. Patient without complaints at end of session. Progressing well per POC at this time.    Comorbidities  kidney stone, depression, bipolar, anxiety, anemia    Rehab Potential  Good    PT Treatment/Interventions  ADLs/Self Care Home Management;Cryotherapy;Electrical Stimulation;Moist Heat;Therapeutic exercise;Therapeutic activities;Ultrasound;Neuromuscular re-education;Patient/family education;Manual techniques;Vasopneumatic Device;Taping;Splinting;Energy conservation;Dry needling;Passive range of  motion;Scar mobilization  PT Next Visit Plan  continue to simulate ADL and work activities requiring full-handed grasp, pinch grip activities    PT Home Exercise Plan  lifting, reaching, instructed in taking jar tops or spice jar tops on and off; pt. issued green theraputty for 3-point pinch exercises    Consulted and Agree with Plan of Care  Patient       Patient will benefit from skilled therapeutic intervention in order to improve the following deficits and impairments:  Hypomobility, Increased edema, Decreased scar mobility, Decreased activity tolerance, Increased fascial restricitons, Pain, Impaired UE functional use, Improper body mechanics, Decreased range of motion, Impaired flexibility, Postural dysfunction  Visit Diagnosis: Pain in left elbow  Stiffness of left elbow, not elsewhere classified  Stiffness of left wrist, not elsewhere classified  Muscle weakness (generalized)     Problem List Patient Active Problem List   Diagnosis Date Noted  . Right carpal tunnel syndrome 12/09/2018  . Closed nondisplaced fracture of fifth left metatarsal bone 05/10/2018  . History of shingles 01/08/2018  . Menopause 01/08/2018  . Left ankle injury, subsequent encounter 08/04/2017  . PCP NOTES >>>>>>>>>> 07/11/2017  . 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 Chronic knee pain 04/18/2015  . Plantar fasciitis 04/18/2015  . Tobacco use disorder 03/15/2015  . Morbid obesity ([place]) 01/05/2015  . Bipolar affective ([place]) 01/02/2015     Janene Harvey, PT, DPT 04/09/19 4:14 PM   [facility] [address]  [place], [place], [address] Phone: [phone]   Fax:  [phone]  Name: Ashley [facility] MRN: [id number] Date of Birth: 08/26/1970

## 2019-04-14 ENCOUNTER — Other Ambulatory Visit: Payer: Self-pay

## 2019-04-15 ENCOUNTER — Encounter: Payer: Self-pay | Admitting: Internal Medicine

## 2019-04-15 ENCOUNTER — Encounter: Payer: Self-pay | Admitting: Physical Therapy

## 2019-04-15 ENCOUNTER — Other Ambulatory Visit: Payer: Self-pay

## 2019-04-15 ENCOUNTER — Telehealth (HOSPITAL_COMMUNITY): Payer: Self-pay | Admitting: *Deleted

## 2019-04-15 ENCOUNTER — Ambulatory Visit (INDEPENDENT_AMBULATORY_CARE_PROVIDER_SITE_OTHER): Payer: No Typology Code available for payment source | Admitting: Internal Medicine

## 2019-04-15 ENCOUNTER — Ambulatory Visit: Payer: PRIVATE HEALTH INSURANCE | Attending: Orthopedic Surgery | Admitting: Physical Therapy

## 2019-04-15 VITALS — BP 128/82 | HR 79 | Temp 96.5°F | Resp 16 | Ht 64.0 in | Wt 261.4 lb

## 2019-04-15 DIAGNOSIS — M25632 Stiffness of left wrist, not elsewhere classified: Secondary | ICD-10-CM | POA: Diagnosis present

## 2019-04-15 DIAGNOSIS — G4710 Hypersomnia, unspecified: Secondary | ICD-10-CM | POA: Diagnosis not present

## 2019-04-15 DIAGNOSIS — M25622 Stiffness of left elbow, not elsewhere classified: Secondary | ICD-10-CM | POA: Diagnosis present

## 2019-04-15 DIAGNOSIS — M25522 Pain in left elbow: Secondary | ICD-10-CM | POA: Diagnosis not present

## 2019-04-15 DIAGNOSIS — M6281 Muscle weakness (generalized): Secondary | ICD-10-CM | POA: Insufficient documentation

## 2019-04-15 NOTE — Progress Notes (Signed)
Subjective:    Patient ID: Ashley Franklin, female    DOB: [birth year], 48 y.o.   MRN: [id number]  DOS:  04/15/2019 Type of visit - description: Acute The patient request me to extend her disability paperwork. The patient desires to proceed with bariatric surgery  Review of Systems  Continue feeling extremely sleepy.  States that she can only stay awake for only  few minutes. She knows she is unable to drive and she doubts she will be able to work.  Past Medical History:  Diagnosis Date  . Anemia   . Anxiety   . Bipolar affective disorder ([place])   . Blood transfusion without reported diagnosis   . Bronchospasm with bronchitis, acute   . Depression   . Kidney stone   . Right carpal tunnel syndrome 12/09/2018  . SBO (small bowel obstruction) ([place]) 01/2014    Past Surgical History:  Procedure Laterality Date  . CESAREAN SECTION    . ENDOMETRIAL ABLATION    . GASTRIC BYPASS  2001  . SBO  2015    Social History   Socioeconomic History  . Marital status: Married    Spouse name: Not on file  . Number of children: 2  . Years of education: Not on file  . Highest education level: Not on file  Occupational History  . Occupation: phelbotomist    Employer: [employer]  Social Needs  . Financial resource strain: Not on file  . Food insecurity    Worry: Not on file    Inability: Not on file  . Transportation needs    Medical: Not on file    Non-medical: Not on file  Tobacco Use  . Smoking status: Current Every Day Smoker    Packs/day: 0.25    Years: 6.00    Pack years: 1.50  . Smokeless tobacco: Never Used  . Tobacco comment: 1/2 ppd   Substance and Sexual Activity  . Alcohol use: Yes    Alcohol/week: 0.0 standard drinks    Comment: Two times a week.   . Drug use: No  . Sexual activity: Yes    Partners: Male    Birth control/protection: Surgical  Lifestyle  . Physical activity    Days per week: Not on file    Minutes per session: Not on file  . Stress: Not on  file  Relationships  . Social Herbalist on phone: Not on file    Gets together: Not on file    Attends religious service: Not on file    Active member of club or organization: Not on file    Attends meetings of clubs or organizations: Not on file    Relationship status: Not on file  . Intimate partner violence    Fear of current or ex partner: Not on file    Emotionally abused: Not on file    Physically abused: Not on file    Forced sexual activity: Not on file  Other Topics Concern  . 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 If you have any questions, ask your nurse or doctor.        divalproex 500 MG 24 hr tablet Commonly known as: DEPAKOTE ER Take 4 tablets (2,000 mg total) by mouth at bedtime.   FLUoxetine 20 MG tablet Commonly known as: PROZAC Take 4 tablets (80 mg total) by mouth daily.   lamoTRIgine 25 MG tablet Commonly known as: LAMICTAL Take 2 tablets (50 mg total) by mouth daily.   Lurasidone HCl 60 MG Tabs Take 1 tablet (60 mg total) by mouth daily.           Objective:   Physical Exam BP 128/82 (BP Location: Left Arm, Patient Position: Sitting, Cuff Size: Normal)   Pulse 79   Temp (!) 96.5 F (35.8 C) (Temporal)   Resp 16   Ht 5\' 4"  (1.626 m)   Wt 261 lb 6 oz (118.6 kg)   SpO2 100%   BMI 44.86 kg/m  General:   Well developed, NAD, BMI noted. HEENT:  Normocephalic . Face symmetric, atraumatic Neurologic:  alert &  oriented X3.  Speech normal, gait appropriate for age and unassisted Psych--  Cognition and judgment appear intact.  Cooperative with normal attention span and concentration.  Visible  upset when I declined to sign her paperwork     Assessment     Assesment Bipolar, anxiety: per psych Dr H/o kidney stones H/o SBO ~ 2015 Birth control: s/p ablation  OSA: Mild.  Sleep test 09/2018, on a CPAP given her daytime somnolence.  PLAN Hypersomnia: Despite using the CPAP, the patient continued to significant hypersomnia, in her own words she fall asleep every few minutes and is hard to wake up. Neurology is recommended for further evaluation but she needs to be off many of her medications. According to the patient, psychiatry denies getting her off the medications thus no further evaluation is possible. In the meantime she is asking me to fill out disability paperwork. I did fill the disability paperwork earlier this year while work-up is done to determine her condition but at this point I cannot continue writing her off work because the work-up has not been done. -I spoke with Dr. 10/2018 a nurse, Dr. Vanetta Shawl is on leave until 07/06/2019. -I spoke with Dr. 07/08/2019, explaining the situation, we agreed she will discussed with Dr. Evelene Croon about the stopping medication for the sleep study.  Today, I spent more than 30   min with the patient: >50% of the time counseling regards making several phone calls and sending multiple messages.  Coordinating her care.    This visit occurred during the SARS-CoV-2 public health emergency.  Safety protocols were in place, including screening questions prior to the visit, additional usage of staff PPE, and extensive cleaning of exam room while observing appropriate contact time as indicated for disinfecting solutions.

## 2019-04-15 NOTE — Telephone Encounter (Signed)
PCP DR Henderson CELL [phone]

## 2019-04-15 NOTE — Patient Instructions (Signed)
I'll contact Dr. Modesta Messing, will let you know the outcome of that conversation

## 2019-04-15 NOTE — Therapy (Signed)
[facility] [address]  [place], [place], [address] Phone: [phone]   Fax:  [phone]  Physical Therapy Treatment  Patient Details  Name: Ashley Franklin MRN: [id number] Date of Birth: 1970/12/09 Referring Provider (PT): Milly Jakob, MD   Encounter Date: 04/15/2019  PT End of Session - 04/15/19 1649    Visit Number  34    Number of Visits  41    Date for PT Re-Evaluation  05/01/19    Authorization Type  WC: 1 eval + 8 tx + 8 tx +8 tx + 8tx + 8tx    Authorization Time Period  progress note sent on 02/19/2019 (20th visit)    Authorization - Visit Number  77    Authorization - Number of Visits  41    PT Start Time  1607    PT Stop Time  1646    PT Time Calculation (min)  39 min    Activity Tolerance  Patient tolerated treatment well    Behavior During Therapy  WFL for tasks assessed/performed       Past Medical History:  Diagnosis Date  . Anemia   . Anxiety   . Bipolar affective disorder ([place])   . Blood transfusion without reported diagnosis   . Bronchospasm with bronchitis, acute   . Depression   . Kidney stone   . Right carpal tunnel syndrome 12/09/2018  . SBO (small bowel obstruction) ([place]) 01/2014    Past Surgical History:  Procedure Laterality Date  . CESAREAN SECTION    . ENDOMETRIAL ABLATION    . GASTRIC BYPASS  2001  . SBO  2015    There were no vitals filed for this visit.  Subjective Assessment - 04/15/19 1608    Subjective  Elbow is feeling good today. Did not have any lasting soreness after last session.    Pertinent History  kidney stone, depression, bipolar, anxiety, anemia    Patient Stated Goals  return to work    Currently in Pain?  No/denies                       [facility] Adult PT Treatment/Exercise - 04/15/19 0001      Elbow Exercises   Elbow Extension  Strengthening;Both;10 reps;Standing    Elbow Extension Limitations  B triceps extensions with towel  grip x10 5#; 10x 7#   good tolerance     Shoulder Exercises: Standing   Row  Strengthening;Both;10 reps;Weights    Row Weight (lbs)  10#, 15#    Row Limitations  2x10; bath towel grip       Shoulder Exercises: ROM/Strengthening   UBE (Upper Arm Bike)  L2.5 x 3 min forward/3 min backwards      Hand Exercises   Other Hand Exercises  L full hand grip holding canister with 5# 3x30"   c/o weakness and 3/10 pain   Other Hand Exercises  L 3 pt pinch & key grip rows with green band x10 each    cues for slow speed and increased control     Wrist Exercises   Wrist Flexion  Left;Seated;Bar weights/barbell;10 reps;Strengthening    Bar Weights/Barbell (Wrist Flexion)  5 lbs    Wrist Flexion Limitations  eccentric- assistance with concentric phase   2-3/10 pain    Wrist Extension  Strengthening;Left;10 reps;Seated;Bar weights/barbell    Bar Weights/Barbell (Wrist Extension)  5 lbs    Wrist Extension Limitations  eccentric- assistance with concentric  phase   2-3/10 pain    Other wrist exercises  L wrist extensor, flexor stretch against mat x 30 sec each to tolerance     Other wrist exercises  L wrist circumduction CW/CCW x10 each way with red medball   visible shaking/muscle fatigue            PT Education - 04/15/19 1648    Education Details  discussion on remaining exercises being performed at home as part of HEP    Person(s) Educated  Patient    Methods  Explanation;Demonstration;Tactile cues;Verbal cues;Handout    Comprehension  Verbalized understanding;Returned demonstration       PT Short Term Goals - 03/31/19 1633      PT SHORT TERM GOAL #1   Title  Independent w/ inital HEP     Time  3    Period  Weeks    Status  Achieved    Target Date  12/31/18        PT Long Term Goals - 03/31/19 1633      PT LONG TERM GOAL #1   Title  Independent w/ advanced HEP.    Time  4    Period  Weeks    Status  Partially Met      PT LONG TERM GOAL #2   Title  Pt. will demonstrate  an increase in Lt elbow and wrist AROM to WNL to increase function.    Time  4    Period  Weeks    Status  Partially Met      PT LONG TERM GOAL #3   Title  Pt. will demonstrate increase in L elbow/wrist strength to 5/5 and Lt grip strength to >40 lbs to allow for increased elbow stability and functional use of arm.    Baseline  L grip with elbow at 90 degrees: 25ppsi, L grip with elbow extended: 22.5 ppsi (average of 3 trials), MMT: grossly 4/5    Period  Weeks    Status  Partially Met      PT LONG TERM GOAL #4   Title  Patient to report 90% improvement in L elbow pain.    Baseline  Pt reporting ability to do perform ADL's with tolerable pain depending on activity. Pt feels she has definitely improved since beginning therapy.    Period  Weeks    Status  Partially Met      PT LONG TERM GOAL #5   Title  Pt will be able to perform her ususal ADL's and work duties with less than 2/10 overall pain.    Baseline  Pt reporting that most days she is able to perform her ALD's with 2/10 pain but some days are worse.    Time  4    Period  Weeks    Status  Partially Met            Plan - 04/15/19 1650    Clinical Impression Statement  Patient without new complaints at beginning of session, denying lasting soreness after previous session. Worked on fine motor grip strengthening with banded resistance with patient reporting more difficulty with 3 point pinch than lateral pinch grip. Patient tolerated eccentric wrist strengthening with mild rise in pain levels but demonstrating good control throughout. Decreased machine resistance with towel-grip strengthening ther-ex to improve tolerance, with patient reporting good response. Discussed remaining exercises that patient is perform at home for max benefit. Patient tolerated session well and with no complaints at end of session.  Comorbidities  kidney stone, depression, bipolar, anxiety, anemia    Rehab Potential  Good    PT Treatment/Interventions   ADLs/Self Care Home Management;Cryotherapy;Electrical Stimulation;Moist Heat;Therapeutic exercise;Therapeutic activities;Ultrasound;Neuromuscular re-education;Patient/family education;Manual techniques;Vasopneumatic Device;Taping;Splinting;Energy conservation;Dry needling;Passive range of motion;Scar mobilization    PT Next Visit Plan  continue to simulate ADL and work activities requiring full-handed grasp, pinch grip activities    PT Home Exercise Plan  lifting, reaching, instructed in taking jar tops or spice jar tops on and off; pt. issued green theraputty for 3-point pinch exercises    Consulted and Agree with Plan of Care  Patient       Patient will benefit from skilled therapeutic intervention in order to improve the following deficits and impairments:  Hypomobility, Increased edema, Decreased scar mobility, Decreased activity tolerance, Increased fascial restricitons, Pain, Impaired UE functional use, Improper body mechanics, Decreased range of motion, Impaired flexibility, Postural dysfunction  Visit Diagnosis: Pain in left elbow  Stiffness of left elbow, not elsewhere classified  Stiffness of left wrist, not elsewhere classified  Muscle weakness (generalized)     Problem List Patient Active Problem List   Diagnosis Date Noted  . Right carpal tunnel syndrome 12/09/2018  . Closed nondisplaced fracture of fifth left metatarsal bone 05/10/2018  . History of shingles 01/08/2018  . Menopause 01/08/2018  . Left ankle injury, subsequent encounter 08/04/2017  . PCP NOTES >>>>>>>>>> 07/11/2017  . 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 Chronic knee pain 04/18/2015  . Plantar fasciitis 04/18/2015  . Tobacco use disorder 03/15/2015  . Morbid obesity ([place]) 01/05/2015  . Bipolar affective ([place]) 01/02/2015     Janene Harvey, PT, DPT 04/15/19 4:52 PM   [facility] [address]  [place], [place], [address] Phone:  [phone]   Fax:  [phone]  Name: Ashley Franklin MRN: [id number] Date of Birth: 10-Feb-1971

## 2019-04-15 NOTE — Telephone Encounter (Signed)
DR PAZ CALLED AFTER HE WAS ASKED TO COMPLETELY FMLA PAPER WORK BY THE PATIENT. I INFORMED THAT DR. HISADA IS ON FMLA & WILL RETURN FEB 2021. AND THAT I HAVE SPOKEN TO PATIENT PREVIOUSLY  & THERE WAS NEVER NO MENTION OF FMLA PAPER WORK. I SHARED LAST OFFICE NOTE 02/21/19 Assessment and Plan:  She continues to report episodes of drowsiness weakness during the day despite tapering down Valium.  Labs checked (includes VPA, CMP) with no significant abnormality to be contributing to her drowsiness.  Will taper off Valium to avoid its potential side effect of drowsiness. Will continue other psychotropics at this time given benefit of treating her mood symptoms (she had at least a few episodes of relapse in the past) outweighs its potential risk to cause drowsiness.  She has a follow-up with his neurologist for further evaluation.   Although she reports anxiety in the context of not being able to drive/work due to her potential episode of seizure, she denies any other significant mood symptoms since her last visit.  Point continue Depakote to target mood dysregulation.  We will continue lamotrigine to target bipolar disorder.  Discussed risk of Stevens-Johnson syndrome.  Will continue fluoxetine to target depression.  We will continue Latuda for mood dysregulation. Will taper off valium as described above. Discussed behavioral activation, and sleep hygiene.   Plan: I have reviewed and updated plans as below 1.ContinueDepakote 2000 mg at night  3. Continue fluoxetine 80 mg daily  4.Continuelamotrigine50mg  daily 5.Continuelatuda 60 mg daily 6. Decrease Valium 2.5 mg daily for one week, then discontinue  8.Next appointment:in December - she sees a therapistweekly020 FROM DR HISADA:   DR PAZ  STATED HE DOESN'T FEEL COMFORTABLE  & THEN ASKED TO SPEAK WITH PROVIDER COVERING DR HISADA. I GAVE THE Cut Off OFFICE # TO REACH DR Toy Care

## 2019-04-15 NOTE — Telephone Encounter (Signed)
Paperwork destroyed.

## 2019-04-15 NOTE — Telephone Encounter (Signed)
Pt PCP Dr. Larose Kells left message asking to speak with you urgently about this pt.   Per Dr. Larose Kells: PLAN  Hypersomnia  Provider did notDespite using the CPAP, the patient continued to significant hypersomnia, in her own words she fall asleep every few minutes and is hard to wake up. Neurology is recommended for further evaluation but she needs to be off many of her medications. According to the patient, psychiatry denies getting her off the medications thus no further evaluation is possible. In the meantime she is asking me to fill out a disability paperwork. I did feel a disability paperwork earlier this year while work-up is done to determine her condition but at this point I cannot continue writing her off work because the work-up has not been done. 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 Modesta Messing is on leave until 07/06/2019. Was not done.  go into detail.

## 2019-04-15 NOTE — Progress Notes (Signed)
Pre visit review using our clinic review tool, if applicable. No additional management support is needed unless otherwise documented below in the visit note. 

## 2019-04-16 ENCOUNTER — Telehealth: Payer: Self-pay | Admitting: Neurology

## 2019-04-16 ENCOUNTER — Telehealth: Payer: Self-pay | Admitting: Internal Medicine

## 2019-04-16 ENCOUNTER — Encounter: Payer: Self-pay | Admitting: Internal Medicine

## 2019-04-16 NOTE — Telephone Encounter (Signed)
Patient states she got a call from her work stating she needs to go back to work, patient states that PCP called her out of work in september and her work wants her to come back tomorrow.  Patient needs to know if she can go back or if he can further extend an out of work [employer]. Call back [phone]

## 2019-04-16 NOTE — Telephone Encounter (Signed)
I have never seen this patient and have no idea about what has been going on with them. I will not be able to help in filling out any paperwork for this patient. I do not have any contact information on Dr. Larose Kells. If this is urgent for her then she needs to contact me directly.

## 2019-04-16 NOTE — Telephone Encounter (Signed)
Spoke w/ Pt- she has actually spoke w/ HR today- they have extended her return to work date to 1 week. Informed PCP is now waiting calls back from psychiatry and neurology himself. Pt verbalized understanding.

## 2019-04-16 NOTE — Telephone Encounter (Signed)
Please let the patient know on I am making several phone calls to neurology, psychiatry trying to coordinate her care.   I will let her know what can be done as soon as I can but again I'm waiting the response from neurology and psychiatry.

## 2019-04-16 NOTE — Telephone Encounter (Signed)
Of course. Looks he talked with the [place] office as well. If he calls back I will give him your number or have him message you.

## 2019-04-16 NOTE — Telephone Encounter (Signed)
Patients Primary care provider Dr.Paz called to speak to Dr.Willis, Informed provider is out of the office.  Dr.Paz then requested to speak to Dr.Athar in regards to the patient.   Please follow up.

## 2019-04-17 ENCOUNTER — Ambulatory Visit: Payer: PRIVATE HEALTH INSURANCE

## 2019-04-17 ENCOUNTER — Other Ambulatory Visit: Payer: Self-pay

## 2019-04-17 DIAGNOSIS — M25632 Stiffness of left wrist, not elsewhere classified: Secondary | ICD-10-CM

## 2019-04-17 DIAGNOSIS — M25522 Pain in left elbow: Secondary | ICD-10-CM

## 2019-04-17 DIAGNOSIS — M25622 Stiffness of left elbow, not elsewhere classified: Secondary | ICD-10-CM

## 2019-04-17 DIAGNOSIS — M6281 Muscle weakness (generalized): Secondary | ICD-10-CM

## 2019-04-17 NOTE — Telephone Encounter (Signed)
Spoke w/ Pt- informed of recommendations. She will contact [employer] to have paperwork faxed over.

## 2019-04-17 NOTE — Therapy (Signed)
[facility] [address]  [place], [place], [address] Phone: [phone]   Fax:  [phone]  Physical Therapy Treatment  Patient Details  Name: Ashley Franklin MRN: [id number] Date of Birth: 1970-12-28 Referring Provider (PT): Milly Jakob, MD   Encounter Date: 04/17/2019  PT End of Session - 04/17/19 1714    Visit Number  35    Number of Visits  41    Date for PT Re-Evaluation  05/01/19    Authorization Type  WC: 1 eval + 8 tx + 8 tx +8 tx + 8tx + 8tx    Authorization Time Period  progress note sent on 02/19/2019 (20th visit)    Authorization - Visit Number  72    Authorization - Number of Visits  41    PT Start Time  1705    PT Stop Time  1743    PT Time Calculation (min)  38 min    Activity Tolerance  Patient tolerated treatment well    Behavior During Therapy  WFL for tasks assessed/performed       Past Medical History:  Diagnosis Date  . Anemia   . Anxiety   . Bipolar affective disorder ([place])   . Blood transfusion without reported diagnosis   . Bronchospasm with bronchitis, acute   . Depression   . Kidney stone   . Right carpal tunnel syndrome 12/09/2018  . SBO (small bowel obstruction) ([place]) 01/2014    Past Surgical History:  Procedure Laterality Date  . CESAREAN SECTION    . ENDOMETRIAL ABLATION    . GASTRIC BYPASS  2001  . SBO  2015    There were no vitals filed for this visit.  Subjective Assessment - 04/17/19 1712    Subjective  Pt. reporting she would like to continue working on wide grip strengthening activities in session today.    Pertinent History  kidney stone, depression, bipolar, anxiety, anemia    Patient Stated Goals  return to work    Currently in Pain?  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 x 10 reps with 5# dumbbell in container  at chest height       Shoulder Exercises: ROM/Strengthening   UBE (Upper Arm Bike)  L2.5 x 3 min forward/3 min backwards    Lat Pull Limitations  15#, 10# straight arm lat pulldowns with towel grip around handle    1 set overheand grip, 1 set underhand grip    Cybex Press  10 reps    Cybex Press Limitations  wide grip; 20#     Cybex Row  10 reps    Cybex Row Limitations  15# standing overhead and underhand L row  Wrist Exercises   Wrist Flexion  Left;Seated;Bar weights/barbell;10 reps;Strengthening    Bar Weights/Barbell (Wrist Flexion)  Other (comment)   6#   Wrist Extension  Left;10 reps;Seated;Bar weights/barbell;Strengthening    Bar Weights/Barbell (Wrist Extension)  Other (comment)   6#   Other wrist exercises  L wrist ABCs holding red med ball (1000Gr) med ball     Other wrist exercises  L wrist flexor, extensor stretch 2 x 30 sec each way                PT Short Term Goals - 03/31/19 1633      PT SHORT TERM GOAL #1   Title  Independent w/ inital HEP     Time  3    Period  Weeks    Status  Achieved    Target Date  12/31/18        PT Long Term Goals - 03/31/19 1633      PT LONG TERM GOAL #1   Title  Independent w/ advanced HEP.    Time  4    Period  Weeks    Status  Partially Met      PT LONG TERM GOAL #2   Title  Pt. will demonstrate an increase in Lt elbow and wrist AROM to WNL to increase function.    Time  4    Period  Weeks    Status  Partially Met      PT LONG TERM GOAL #3   Title  Pt. will demonstrate increase in L elbow/wrist strength to 5/5 and Lt grip strength  to >40 lbs to allow for increased elbow stability and functional use of arm.    Baseline  L grip with elbow at 90 degrees: 25ppsi, L grip with elbow extended: 22.5 ppsi (average of 3 trials), MMT: grossly 4/5    Period  Weeks    Status  Partially Met      PT LONG TERM GOAL #4   Title  Patient to report 90% improvement in L elbow pain.    Baseline  Pt reporting ability to do perform ADL's with tolerable pain depending on activity. Pt feels she has definitely improved since beginning therapy.    Period  Weeks    Status  Partially Met      PT LONG TERM GOAL #5   Title  Pt will be able to perform her ususal ADL's and work duties with less than 2/10 overall pain.    Baseline  Pt reporting that most days she is able to perform her ALD's with 2/10 pain but some days are worse.    Time  4    Period  Weeks    Status  Partially Met            Plan - 04/17/19 1715    Clinical Impression Statement  Spring doing well and denies soreness after last session.  Able to progress machine related grip strengthening activities focusing on wide-whole hand grasping as pt. noting most difficulty with this at home.  Simulated handling of blood vial transport container (~5#) which challenges pt. grip typically to prepare pt. for work-related duties.  Spring had most difficulty/L elbow pain after underhand grip pulling activities on rowing machine however this pain subsided to baseline after activity. Pt. does note she plans to utilize her home TENS unit for some pain relief tonight as this has been effective.    Comorbidities  kidney stone, depression, bipolar, anxiety, anemia    Rehab  Potential  Good    PT Treatment/Interventions  ADLs/Self Care Home Management;Cryotherapy;Electrical Stimulation;Moist Heat;Therapeutic exercise;Therapeutic activities;Ultrasound;Neuromuscular re-education;Patient/family education;Manual techniques;Vasopneumatic Device;Taping;Splinting;Energy conservation;Dry needling;Passive range  of motion;Scar mobilization    PT Next Visit Plan  continue to simulate ADL and work activities requiring full-handed grasp, pinch grip activities    PT Home Exercise Plan  lifting, reaching, instructed in taking jar tops or spice jar tops on and off; pt. issued green theraputty for 3-point pinch exercises    Consulted and Agree with Plan of Care  Patient       Patient will benefit from skilled therapeutic intervention in order to improve the following deficits and impairments:  Hypomobility, Increased edema, Decreased scar mobility, Decreased activity tolerance, Increased fascial restricitons, Pain, Impaired UE functional use, Improper body mechanics, Decreased range of motion, Impaired flexibility, Postural dysfunction  Visit Diagnosis: Pain in left elbow  Stiffness of left elbow, not elsewhere classified  Stiffness of left wrist, not elsewhere classified  Muscle weakness (generalized)     Problem List Patient Active Problem List   Diagnosis Date Noted  . Right carpal tunnel syndrome 12/09/2018  . Closed nondisplaced fracture of fifth left metatarsal bone 05/10/2018  . History of shingles 01/08/2018  . Menopause 01/08/2018  . Left ankle injury, subsequent encounter 08/04/2017  . PCP NOTES >>>>>>>>>> 07/11/2017  . 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 Chronic knee pain 04/18/2015  . Plantar fasciitis 04/18/2015  . Tobacco use disorder 03/15/2015  . Morbid obesity ([place]) 01/05/2015  . Bipolar affective ([place]) 01/02/2015    Bess Harvest, PTA 04/17/19 6:06 PM   [facility] [address]  [place], [place], [address] Phone: [phone]   Fax:  [phone]  Name: THY GULLIKSON MRN: [id number] Date of Birth: 1970/06/29

## 2019-04-17 NOTE — Telephone Encounter (Signed)
I have had several staff message interchanges with primary care physician and patient's new psychiatrist.

## 2019-04-17 NOTE — Telephone Encounter (Signed)
After I discussed the case with neurology and psychiatry they said they are in no  position to make "any statement" regards going back to work. The patient has an appointment to see Dr. Toy Care next week to see if he is possible to come off psychotropic for 2 weeks and allowing a sleep study. To be fair with the patient, I will have to tell HR that she is not able to go to work until the work-up is completed. Please advise patient of above 04/27/2019.

## 2019-04-17 NOTE — Assessment & Plan Note (Signed)
Hypersomnia: Despite using the CPAP, the patient continued to significant hypersomnia, in her own words she fall asleep every few minutes and is hard to wake up. Neurology is recommended for further evaluation but she needs to be off many of her medications. According to the patient, psychiatry denies getting her off the medications thus no further evaluation is possible. In the meantime she is asking me to fill out disability paperwork. I did fill the disability paperwork earlier this year while work-up is done to determine her condition but at this point I cannot continue writing her off work because the work-up has not been done. 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 Modesta Messing is on leave until 07/06/2019. -I spoke with Dr. Toy Care, explaining the situation, we agreed she will discussed with Dr. Rexene Alberts about the stopping medication for the sleep study.

## 2019-04-18 DIAGNOSIS — Z0279 Encounter for issue of other medical certificate: Secondary | ICD-10-CM

## 2019-04-18 NOTE — Telephone Encounter (Signed)
FMLA extension forms completed and faxed back to [employer] at [phone]. Form sent for scanning. Received fax confirmation.

## 2019-04-18 NOTE — Telephone Encounter (Signed)
Paperwork completed, unable to work until 06/15/2019 due to hypersomnolence and while work-up is performed.

## 2019-04-18 NOTE — Telephone Encounter (Signed)
FMLA forms received. Placed in MD red folder for completion.

## 2019-04-21 ENCOUNTER — Other Ambulatory Visit: Payer: Self-pay

## 2019-04-21 ENCOUNTER — Encounter: Payer: Self-pay | Admitting: Physical Therapy

## 2019-04-21 ENCOUNTER — Ambulatory Visit: Payer: PRIVATE HEALTH INSURANCE | Admitting: Physical Therapy

## 2019-04-21 DIAGNOSIS — M25622 Stiffness of left elbow, not elsewhere classified: Secondary | ICD-10-CM

## 2019-04-21 DIAGNOSIS — M25522 Pain in left elbow: Secondary | ICD-10-CM

## 2019-04-21 DIAGNOSIS — M6281 Muscle weakness (generalized): Secondary | ICD-10-CM

## 2019-04-21 DIAGNOSIS — M25632 Stiffness of left wrist, not elsewhere classified: Secondary | ICD-10-CM

## 2019-04-21 NOTE — Therapy (Signed)
[facility] [address]  [place], [place], [address] Phone: [phone]   Fax:  [phone]  Physical Therapy Treatment  Patient Details  Name: Ashley Franklin MRN: [id number] Date of Birth: 08/14/1970 Referring Provider (PT): Milly Jakob, MD   Encounter Date: 04/21/2019  PT End of Session - 04/21/19 1616    Visit Number  36    Number of Visits  41    Date for PT Re-Evaluation  05/01/19    Authorization Type  WC: 1 eval + 8 tx + 8 tx +8 tx + 8tx + 8tx    Authorization Time Period  progress note sent on 02/19/2019 (20th visit)    Authorization - Visit Number  36    Authorization - Number of Visits  41    PT Start Time  1532    PT Stop Time  1610    PT Time Calculation (min)  38 min    Activity Tolerance  Patient tolerated treatment well    Behavior During Therapy  WFL for tasks assessed/performed       Past Medical History:  Diagnosis Date  . Anemia   . Anxiety   . Bipolar affective disorder ([place])   . Blood transfusion without reported diagnosis   . Bronchospasm with bronchitis, acute   . Depression   . Kidney stone   . Right carpal tunnel syndrome 12/09/2018  . SBO (small bowel obstruction) ([place]) 01/2014    Past Surgical History:  Procedure Laterality Date  . CESAREAN SECTION    . ENDOMETRIAL ABLATION    . GASTRIC BYPASS  2001  . SBO  2015    There were no vitals filed for this visit.  Subjective Assessment - 04/21/19 1533    Subjective  Having a good day today. No new issues.    Pertinent History  kidney stone, depression, bipolar, anxiety, anemia    Patient Stated Goals  return to work    Currently in Pain?  No/denies                       [facility] Adult PT Treatment/Exercise - 04/21/19 0001      Therapeutic Activites    Therapeutic Activities  Work Simulation    Work Simulation  simulated putting blood vial transport container in transport tube x 10 reps with 5# dumbbell in  container  at chest height ; simulating putting tourniquet on 10# "arm" x10   0-4/10 pain throughout activities     Elbow Exercises   Elbow Flexion  Strengthening;Left;10 reps;Seated    Elbow Flexion Limitations  in 3 orientations with yellow medball; 10x palm up, 10x wrist neutral, 3x3 reps palm down   PT spotting for safety     Shoulder Exercises: ROM/Strengthening   UBE (Upper Arm Bike)  L2.5 x 3 min forward/3 min backwards      Hand Exercises   Other Hand Exercises  L 3 pt pinch rows with green and blue band x10 each    reporting improved difficulty with green TB     Wrist Exercises   Wrist Radial Deviation  Left;Strengthening;10 reps    Wrist Radial Deviation Limitations  3/4 grip on long dowel   cues to decrease speed and increase control   Wrist Ulnar Deviation  Strengthening;Left;10 reps    Wrist Ulnar Deviation Limitations  1/4 grip on long dowel  PT Short Term Goals - 03/31/19 1633      PT SHORT TERM GOAL #1   Title  Independent w/ inital HEP     Time  3    Period  Weeks    Status  Achieved    Target Date  12/31/18        PT Long Term Goals - 03/31/19 1633      PT LONG TERM GOAL #1   Title  Independent w/ advanced HEP.    Time  4    Period  Weeks    Status  Partially Met      PT LONG TERM GOAL #2   Title  Pt. will demonstrate an increase in Lt elbow and wrist AROM to WNL to increase function.    Time  4    Period  Weeks    Status  Partially Met      PT LONG TERM GOAL #3   Title  Pt. will demonstrate increase in L elbow/wrist strength to 5/5 and Lt grip strength to >40 lbs to allow for increased elbow stability and functional use of arm.    Baseline  L grip with elbow at 90 degrees: 25ppsi, L grip with elbow extended: 22.5 ppsi (average of 3 trials), MMT: grossly 4/5    Period  Weeks    Status  Partially Met      PT LONG TERM GOAL #4   Title  Patient to report 90% improvement in L elbow pain.    Baseline  Pt reporting ability to  do perform ADL's with tolerable pain depending on activity. Pt feels she has definitely improved since beginning therapy.    Period  Weeks    Status  Partially Met      PT LONG TERM GOAL #5   Title  Pt will be able to perform her ususal ADL's and work duties with less than 2/10 overall pain.    Baseline  Pt reporting that most days she is able to perform her ALD's with 2/10 pain but some days are worse.    Time  4    Period  Weeks    Status  Partially Met            Plan - 04/21/19 1616    Clinical Impression Statement  Patient arrived to session with no new complaints. Focused on work-simulation activities with patient reporting no issues with tourniquet activity, mild pain with tube system activity. Challenged full grip strengthening with weighted elbow flexion- patient with most difficulty and visible shaking and muscle fatigue with pronated orientation. Limited by 4/10 pain and weakness, requiring rest break after every 3 reps. Patient able to tolerate increase in banded resistance with 3 point pinch today without complaints. Patient reported lingering 3/10 pain in L elbow at end of session but did not request modality as she has personal TENS unit at home. Patient continues to progress towards goals.    Comorbidities  kidney stone, depression, bipolar, anxiety, anemia    Rehab Potential  Good    PT Treatment/Interventions  ADLs/Self Care Home Management;Cryotherapy;Electrical Stimulation;Moist Heat;Therapeutic exercise;Therapeutic activities;Ultrasound;Neuromuscular re-education;Patient/family education;Manual techniques;Vasopneumatic Device;Taping;Splinting;Energy conservation;Dry needling;Passive range of motion;Scar mobilization    PT Next Visit Plan  continue to simulate ADL and work activities requiring full-handed grasp, pinch grip activities    PT Home Exercise Plan  lifting, reaching, instructed in taking jar tops or spice jar tops on and off; pt. issued green theraputty for  3-point pinch exercises    Consulted and  Agree with Plan of Care  Patient       Patient will benefit from skilled therapeutic intervention in order to improve the following deficits and impairments:  Hypomobility, Increased edema, Decreased scar mobility, Decreased activity tolerance, Increased fascial restricitons, Pain, Impaired UE functional use, Improper body mechanics, Decreased range of motion, Impaired flexibility, Postural dysfunction  Visit Diagnosis: Pain in left elbow  Stiffness of left elbow, not elsewhere classified  Stiffness of left wrist, not elsewhere classified  Muscle weakness (generalized)     Problem List Patient Active Problem List   Diagnosis Date Noted  . Right carpal tunnel syndrome 12/09/2018  . Closed nondisplaced fracture of fifth left metatarsal bone 05/10/2018  . History of shingles 01/08/2018  . Menopause 01/08/2018  . Left ankle injury, subsequent encounter 08/04/2017  . PCP NOTES >>>>>>>>>> 07/11/2017  . 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 Chronic knee pain 04/18/2015  . Plantar fasciitis 04/18/2015  . Tobacco use disorder 03/15/2015  . Morbid obesity ([place]) 01/05/2015  . Bipolar affective ([place]) 01/02/2015     Janene Harvey, PT, DPT 04/21/19 4:22 PM   [facility] [address]  [place], [place], [address] Phone: [phone]   Fax:  [phone]  Name: Ashley Franklin MRN: [id number] Date of Birth: 1970-06-04

## 2019-04-23 ENCOUNTER — Telehealth: Payer: Self-pay | Admitting: Neurology

## 2019-04-23 ENCOUNTER — Encounter: Payer: Self-pay | Admitting: Neurology

## 2019-04-23 ENCOUNTER — Ambulatory Visit (HOSPITAL_COMMUNITY): Payer: No Typology Code available for payment source | Admitting: Psychiatry

## 2019-04-23 ENCOUNTER — Encounter: Payer: Self-pay | Admitting: Internal Medicine

## 2019-04-23 ENCOUNTER — Other Ambulatory Visit: Payer: Self-pay

## 2019-04-23 ENCOUNTER — Ambulatory Visit (INDEPENDENT_AMBULATORY_CARE_PROVIDER_SITE_OTHER): Payer: No Typology Code available for payment source | Admitting: Psychiatry

## 2019-04-23 ENCOUNTER — Ambulatory Visit: Payer: PRIVATE HEALTH INSURANCE

## 2019-04-23 ENCOUNTER — Encounter: Payer: Self-pay | Admitting: Psychiatry

## 2019-04-23 DIAGNOSIS — G4710 Hypersomnia, unspecified: Secondary | ICD-10-CM

## 2019-04-23 DIAGNOSIS — Z9989 Dependence on other enabling machines and devices: Secondary | ICD-10-CM

## 2019-04-23 DIAGNOSIS — F3176 Bipolar disorder, in full remission, most recent episode depressed: Secondary | ICD-10-CM | POA: Diagnosis not present

## 2019-04-23 DIAGNOSIS — G4733 Obstructive sleep apnea (adult) (pediatric): Secondary | ICD-10-CM

## 2019-04-23 NOTE — Progress Notes (Signed)
[facility] MD OP Progress Note  I connected with  Hughie Closs on 04/23/19 by a video enabled telemedicine application and verified that I am speaking with the correct person using two identifiers.   I discussed the limitations of evaluation and management by telemedicine. The patient expressed understanding and agreed to proceed.   04/23/2019 2:05 PM CHANNEL PAPANDREA  MRN:  [id number]  Chief Complaint:  " I really want to get the answers by doing the sleep study."  HPI: Patient reported that she has been having frequent episodes wherein she feels like she is being gassed and does not know what is being going on.  She stated that this has been ongoing for for quite some time now and her driving privileges had to be taken away from her. Patient informed that she has been under the care of Dr. Modesta Messing for quite some time now and in the past when we discussed this Dr. Modesta Messing was not comfortable in recommending for her to be off her medications for the sleep study. However patient feels that she is not better position now and really wants answers.  She stated that she feels comfortable in weaning her off the medicines and staying off the medicines for 2 weeks. I reviewed all her medications with her.  Patient was explained that since Prozac is known to have a long half-life it can be discontinued without any tapering off.  She was also informed that the dose of Lamictal 50 mg is quite low and can be discontinued immediately as well.  She was recommended to cut down the doses of Depakote and Latuda to half for 1 week and then discontinue for 2 weeks before the scheduled date of sleep study. P patient verbalized her understanding of the plan. She asked if the writer would fill out her disability paperwork at this time as she has not been getting any salary since 03/05/2019. She works as a Charity fundraiser at [employer]. She was explained that first the sleep study needs to be concluded and based on the  findings either myself or Dr. Rexene Alberts would be happy to fill out the paper work.  Visit Diagnosis:    ICD-10-CM   1. Bipolar disorder, in full remission, most recent episode depressed ([place])  F31.76     Past Psychiatric History: Bipolar d/o  Past Medical History:  Past Medical History:  Diagnosis Date  . Anemia   . Anxiety   . Bipolar affective disorder ([place])   . Blood transfusion without reported diagnosis   . Bronchospasm with bronchitis, acute   . Depression   . Kidney stone   . Right carpal tunnel syndrome 12/09/2018  . SBO (small bowel obstruction) ([place]) 01/2014    Past Surgical History:  Procedure Laterality Date  . CESAREAN SECTION    . ENDOMETRIAL ABLATION    . GASTRIC BYPASS  2001  . SBO  2015    Family Psychiatric History: see below  Family History:  Family History  Problem Relation Age of Onset  . Diabetes Mother   . Cancer Mother 60       Throat   . Bipolar disorder Mother   . Bipolar disorder Sister   . Bipolar disorder Brother   . Drug abuse Brother   . Bipolar disorder Sister   . Colon cancer Neg Hx   . Cancer - Prostate Neg Hx   . Breast cancer Neg Hx     Social History:  Social History   Socioeconomic History  .  Marital status: Married    Spouse name: Not on file  . Number of children: 2  . Years of education: Not on file  . Highest education level: Not on file  Occupational History  . Occupation: phelbotomist    Employer: [employer]  Social Needs  . Financial resource strain: Not on file  . Food insecurity    Worry: Not on file    Inability: Not on file  . Transportation needs    Medical: Not on file    Non-medical: Not on file  Tobacco Use  . Smoking status: Current Every Day Smoker    Packs/day: 0.25    Years: 6.00    Pack years: 1.50  . Smokeless tobacco: Never Used  . Tobacco comment: 1/2 ppd   Substance and Sexual Activity  . Alcohol use: Yes    Alcohol/week: 0.0 standard drinks    Comment: Two times a week.   . Drug  use: No  . Sexual activity: Yes    Partners: Male    Birth control/protection: Surgical  Lifestyle  . Physical activity    Days per week: Not on file    Minutes per session: Not on file  . Stress: Not on file  Relationships  . Social Musicianconnections    Talks on phone: Not on file    Gets together: Not on file    Attends religious service: Not on file    Active member of club or organization: Not on file    Attends meetings of clubs or organizations: Not on file    Relationship status: Not on file  Other Topics Concern  . Not on file  Social History Narrative   The patient is a Water quality scientistphlebotomist at [employer]    1 son born 1988-08-13 daughter born in [address]1995 she is married and lives with her husband   Prior smoker not current 3 caffeinated beverages a day no alcohol tobacco or drug use      2 independent teachers    Pharmacy schools, music teacher   Right handed    Caffeine 2 cups daily     Allergies:  Allergies  Allergen Reactions  . Nsaids Other (See Comments)    Contraindicated with gastric bypass    Metabolic Disorder Labs: Lab Results  Component Value Date   HGBA1C 4.9 02/06/2017   MPG 105 02/26/2015   No results found for: PROLACTIN Lab Results  Component Value Date   CHOL 165 02/06/2017   TRIG 87 02/06/2017   HDL 54 02/06/2017   LDLCALC 94 02/06/2017   Lab Results  Component Value Date   TSH 1.12 07/10/2017   TSH 1.22 06/08/2016    Therapeutic Level Labs: No results found for: LITHIUM Lab Results  Component Value Date   VALPROATE 71 01/27/2019   VALPROATE 64 10/02/2018   No components found for:  CBMZ  Current Medications: Current Outpatient Medications  Medication Sig Dispense Refill  . divalproex (DEPAKOTE ER) 500 MG 24 hr tablet Take 4 tablets (2,000 mg total) by mouth at bedtime. 360 tablet 1  . FLUoxetine (PROZAC) 20 MG tablet Take 4 tablets (80 mg total) by mouth daily. 360 tablet 1  . lamoTRIgine (LAMICTAL) 25 MG tablet Take 2 tablets (50 mg  total) by mouth daily. 180 tablet 1  . Lurasidone HCl 60 MG TABS Take 1 tablet (60 mg total) by mouth daily. 90 tablet 1   No current facility-administered medications for this visit.     Musculoskeletal: Strength & Muscle  Tone: unable to assess due to telemed visit Gait & Station: unable to assess due to telemed visit Patient leans: unable to assess due to telemed visit  Psychiatric Specialty Exam: ROS  There were no vitals taken for this visit.There is no height or weight on file to calculate BMI.  General Appearance: Fairly Groomed  Eye Contact:  Good  Speech:  Clear and Coherent and Normal Rate  Volume:  Normal  Mood:  Euthymic  Affect:  Congruent  Thought Process:  Goal Directed, Linear and Descriptions of Associations: Intact  Orientation:  Full (Time, Place, and Person)  Thought Content: Logical   Suicidal Thoughts:  No  Homicidal Thoughts:  No  Memory:  Recent;   Good Remote;   Good  Judgement:  Fair  Insight:  Good  Psychomotor Activity:  Normal  Concentration:  Concentration: Good and Attention Span: Good  Recall:  Good  Fund of Knowledge: Good  Language: Good  Akathisia:  Negative  Handed:  Right  AIMS (if indicated): not done  Assets:  Communication Skills Desire for Improvement Financial Resources/Insurance Housing Social Support  ADL's:  Intact  Cognition: WNL  Sleep:  Fair   Screenings: PHQ2-9     Nutrition [facility]06/2017 [facility] Visit from 06/08/2016 in [facility] Office Visit from 03/15/2015 in [facility] Office Visit from 01/12/2015 in [facility]  PHQ-2 Total Score  0  0  0  0  PHQ-9 Total Score  -  2  -  -       Assessment and Plan: Based on her ongoing symptoms of hypersomnia patient has been recommended sleep study by neurology.  Patient was previously apprehensive about getting off of her  medications for 2 weeks for sleep study.  However she feels she is now ready to wean herself off and stay off the medicines for 2 weeks so that she can complete the study and get the conclusive findings.  1. Bipolar disorder, in full remission, most recent episode depressed ([facility]) First step is to schedule the sleep study. Once the sleep study date has been scheduled and confirmed patient can then discontinue Prozac and Lamictal 3 weeks before the scheduled date.  She will then cut down the doses of Depakote and Latuda to have for 1 week and then discontinue.  This way she will be off all her medicines for 2 weeks. Patient was advised to resume her medications and have partial doses for 1 week after the sleep study is completed and then to return back to the original doses after 1 week. I informed the patient that I will be in touch with Dr. Frances Furbish to let her know of the plan.  For follow up- I will schedule an appointment to see the patient after sleep study has been scheduled.     Zena Amos, MD 04/23/2019, 2:05 PM

## 2019-04-23 NOTE — Telephone Encounter (Signed)
Meagan and Sheena: Please note that patient has met with her new psychiatrist.  They have mutually agreed to taper her psychotropic medications.  She will have to be off of her psychotropic medications completely for 2 weeks prior to sleep study testing. Nocturnal polysomnogram and next day MSLT is ordered.  Please advise patient that she should be consistent with her AutoPap usage and that during the nighttime sleep study she will be using her AutoPap machine. Will copy Dr. Jannifer Franklin, Dr. Toy Care and PCP.

## 2019-04-24 ENCOUNTER — Ambulatory Visit: Payer: PRIVATE HEALTH INSURANCE

## 2019-04-24 DIAGNOSIS — M25522 Pain in left elbow: Secondary | ICD-10-CM | POA: Diagnosis not present

## 2019-04-24 DIAGNOSIS — M6281 Muscle weakness (generalized): Secondary | ICD-10-CM

## 2019-04-24 DIAGNOSIS — M25632 Stiffness of left wrist, not elsewhere classified: Secondary | ICD-10-CM

## 2019-04-24 DIAGNOSIS — M25622 Stiffness of left elbow, not elsewhere classified: Secondary | ICD-10-CM

## 2019-04-24 NOTE — Therapy (Signed)
[facility] [address]  [place], [place], [address] Phone: [phone]   Fax:  [phone]  Physical Therapy Treatment  Patient Details  Name: Ashley Franklin MRN: [id number] Date of Birth: 1970-07-30 Referring Provider (PT): Milly Jakob, MD   Encounter Date: 04/24/2019  PT End of Session - 04/24/19 1021    Visit Number  37    Number of Visits  41    Date for PT Re-Evaluation  05/01/19    Authorization Type  WC: 1 eval + 8 tx + 8 tx +8 tx + 8tx + 8tx    Authorization Time Period  progress note sent on 02/19/2019 (20th visit)    Authorization - Visit Number  100    Authorization - Number of Visits  41    PT Start Time  1016    PT Stop Time  1100    PT Time Calculation (min)  44 min    Activity Tolerance  Patient tolerated treatment well    Behavior During Therapy  WFL for tasks assessed/performed       Past Medical History:  Diagnosis Date  . Anemia   . Anxiety   . Bipolar affective disorder ([place])   . Blood transfusion without reported diagnosis   . Bronchospasm with bronchitis, acute   . Depression   . Kidney stone   . Right carpal tunnel syndrome 12/09/2018  . SBO (small bowel obstruction) ([place]) 01/2014    Past Surgical History:  Procedure Laterality Date  . CESAREAN SECTION    . ENDOMETRIAL ABLATION    . GASTRIC BYPASS  2001  . SBO  2015    There were no vitals filed for this visit.  Subjective Assessment - 04/24/19 1018    Subjective  Pt. noting she is still having daily "black out" episodes which are happening with same frequency as they had been.    Pertinent History  kidney stone, depression, bipolar, anxiety, anemia    Patient Stated Goals  return to work    Currently in Pain?  No/denies    Pain Score  0-No pain    Multiple Pain Sites  No                       [facility] Adult PT Treatment/Exercise - 04/24/19 0001      Therapeutic Activites    Therapeutic Activities   Work Simulation    Work Simulation  pushed sled (to simulate work cart in patient rooms ~ 50# x 6 laps in figure-8 pattern       Shoulder Exercises: ROM/Strengthening   UBE (Upper Arm Bike)  L3.0 x 3 min forward/3 min backwards    Cybex Press  --    Cybex Press Limitations  --    Cybex Row  10 reps   2 sets   Cybex Row Limitations  low row with towel around low handle       Hand Exercises   Other Hand Exercises  3-point pinch grip with 2 2.5 plates together standing and holding 2 x 20 sec     Other Hand Exercises  Stovell grip squeezer:  25# x 15 reps      Wrist Exercises   Wrist Flexion  Left;Seated;Bar weights/barbell;10 reps;Strengthening    Bar Weights/Barbell (Wrist Flexion)  Other (comment)   6#   Wrist Extension  Left;10 reps;Seated;Bar weights/barbell;Strengthening    Bar Weights/Barbell (Wrist Extension)  Other (comment)   6#  Other wrist exercises  L wrist flexion/extension wrist roller with PVC pipe and yellow TB x 6 reps    Other wrist exercises  L wrist flexor, extensor stretch 2 x 30 sec each way                PT Short Term Goals - 03/31/19 1633      PT SHORT TERM GOAL #1   Title  Independent w/ inital HEP     Time  3    Period  Weeks    Status  Achieved    Target Date  12/31/18        PT Long Term Goals - 03/31/19 1633      PT LONG TERM GOAL #1   Title  Independent w/ advanced HEP.    Time  4    Period  Weeks    Status  Partially Met      PT LONG TERM GOAL #2   Title  Pt. will demonstrate an increase in Lt elbow and wrist AROM to WNL to increase function.    Time  4    Period  Weeks    Status  Partially Met      PT LONG TERM GOAL #3   Title  Pt. will demonstrate increase in L elbow/wrist strength to 5/5 and Lt grip strength to >40 lbs to allow for increased elbow stability and functional use of arm.    Baseline  L grip with elbow at 90 degrees: 25ppsi, L grip with elbow extended: 22.5 ppsi (average of 3 trials), MMT: grossly 4/5     Period  Weeks    Status  Partially Met      PT LONG TERM GOAL #4   Title  Patient to report 90% improvement in L elbow pain.    Baseline  Pt reporting ability to do perform ADL's with tolerable pain depending on activity. Pt feels she has definitely improved since beginning therapy.    Period  Weeks    Status  Partially Met      PT LONG TERM GOAL #5   Title  Pt will be able to perform her ususal ADL's and work duties with less than 2/10 overall pain.    Baseline  Pt reporting that most days she is able to perform her ALD's with 2/10 pain but some days are worse.    Time  4    Period  Weeks    Status  Partially Met            Plan - 04/24/19 1024    Clinical Impression Statement  Session focused on work simulation with sled push ~ 50# to simulate pushing wheeled cart in pt. rooms.  Pt. with most difficult turning sled to R with L wrist/elbow driving pivoting turn.  Tolerated continued focused flexor/extensor strengthening at L wrist well today with pain rising to 4/10 at times and subsiding slowing with rest.  Notes she feels confident with tourniquet tying tasks now with work-related tasks as we have practiced this in previous therapy sessions.  Will continue to progress toward goals.    Comorbidities  kidney stone, depression, bipolar, anxiety, anemia    Rehab Potential  Good    PT Treatment/Interventions  ADLs/Self Care Home Management;Cryotherapy;Electrical Stimulation;Moist Heat;Therapeutic exercise;Therapeutic activities;Ultrasound;Neuromuscular re-education;Patient/family education;Manual techniques;Vasopneumatic Device;Taping;Splinting;Energy conservation;Dry needling;Passive range of motion;Scar mobilization    PT Next Visit Plan  continue to simulate ADL and work activities requiring full-handed grasp, pinch grip activities    PT Home Exercise  Plan  lifting, reaching, instructed in taking jar tops or spice jar tops on and off; pt. issued green theraputty for 3-point pinch  exercises    Consulted and Agree with Plan of Care  Patient       Patient will benefit from skilled therapeutic intervention in order to improve the following deficits and impairments:  Hypomobility, Increased edema, Decreased scar mobility, Decreased activity tolerance, Increased fascial restricitons, Pain, Impaired UE functional use, Improper body mechanics, Decreased range of motion, Impaired flexibility, Postural dysfunction  Visit Diagnosis: Pain in left elbow  Stiffness of left elbow, not elsewhere classified  Stiffness of left wrist, not elsewhere classified  Muscle weakness (generalized)     Problem List Patient Active Problem List   Diagnosis Date Noted  . Bipolar disorder, in full remission, most recent episode depressed ([place]) 04/23/2019  . Right carpal tunnel syndrome 12/09/2018  . Closed nondisplaced fracture of fifth left metatarsal bone 05/10/2018  . History of shingles 01/08/2018  . Menopause 01/08/2018  . Left ankle injury, subsequent encounter 08/04/2017  . PCP NOTES >>>>>>>>>> 07/11/2017  . 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 Chronic knee pain 04/18/2015  . Plantar fasciitis 04/18/2015  . Tobacco use disorder 03/15/2015  . Morbid obesity ([place]) 01/05/2015  . Bipolar affective ([place]) 01/02/2015    Bess Harvest, PTA 04/24/19 12:45 PM    [facility] [address]  [place], [place], [address] Phone: [phone]   Fax:  [phone]  Name: MOZEL BURDETT MRN: [id number] Date of Birth: [birth year]

## 2019-04-25 ENCOUNTER — Telehealth: Payer: Self-pay

## 2019-04-25 ENCOUNTER — Encounter: Payer: Self-pay | Admitting: Internal Medicine

## 2019-04-25 NOTE — Telephone Encounter (Signed)
Copied from [place] [phone]. Topic: General - Other >> Apr 25, 2019 10:34 AM Carolyn Stare wrote: Pt call to say they r going to take her job away from her and she need to speak with the nurse concerning the paperwork. Pt said she does not need her FMLA extended she need a return to work note

## 2019-04-25 NOTE — Telephone Encounter (Signed)
See mychart messages between PCP and Pt.

## 2019-04-25 NOTE — Telephone Encounter (Deleted)
Maudie Mercury- can you assist with this please?

## 2019-04-25 NOTE — Telephone Encounter (Addendum)
Advise patient At the last face-to-face visit 04/15/2019 she clearly told me that she cannot work, subsequently and at her request I completed extensive paperwork to take her off work. Before she is release back to work, neurology work-up needs to be completed

## 2019-04-25 NOTE — Telephone Encounter (Signed)
Can you please call Pt and discuss- I don't know what she needs.

## 2019-04-28 ENCOUNTER — Ambulatory Visit: Payer: PRIVATE HEALTH INSURANCE

## 2019-04-30 ENCOUNTER — Ambulatory Visit: Payer: PRIVATE HEALTH INSURANCE | Admitting: Physical Therapy

## 2019-05-05 ENCOUNTER — Ambulatory Visit: Payer: PRIVATE HEALTH INSURANCE

## 2019-05-05 ENCOUNTER — Other Ambulatory Visit: Payer: Self-pay

## 2019-05-05 DIAGNOSIS — M25622 Stiffness of left elbow, not elsewhere classified: Secondary | ICD-10-CM

## 2019-05-05 DIAGNOSIS — M25522 Pain in left elbow: Secondary | ICD-10-CM | POA: Diagnosis not present

## 2019-05-05 DIAGNOSIS — M25632 Stiffness of left wrist, not elsewhere classified: Secondary | ICD-10-CM

## 2019-05-05 DIAGNOSIS — M6281 Muscle weakness (generalized): Secondary | ICD-10-CM

## 2019-05-05 NOTE — Therapy (Signed)
[facility] [address]  [place], [place], [address] Phone: [phone]   Fax:  [phone]  Physical Therapy Treatment  Patient Details  Name: Ashley Franklin MRN: [id number] Date of Birth: [birth year] Referring Provider (PT): Milly Jakob, MD   Encounter Date: 05/05/2019  PT End of Session - 05/05/19 1538    Visit Number  38    Number of Visits  58    Date for PT Re-Evaluation  05/26/19    Authorization Type  WC: 1 eval + 8 tx + 8 tx +8 tx + 8tx + 8tx + 6tx    Authorization Time Period  progress note sent on 02/19/2019 (20th visit)    Authorization - Visit Number  38    Authorization - Number of Visits  19    PT Start Time  0539    PT Stop Time  1612    PT Time Calculation (min)  41 min    Activity Tolerance  Patient tolerated treatment well    Behavior During Therapy  WFL for tasks assessed/performed       Past Medical History:  Diagnosis Date  . Anemia   . Anxiety   . Bipolar affective disorder ([place])   . Blood transfusion without reported diagnosis   . Bronchospasm with bronchitis, acute   . Depression   . Kidney stone   . Right carpal tunnel syndrome 12/09/2018  . SBO (small bowel obstruction) ([place]) 01/2014    Past Surgical History:  Procedure Laterality Date  . CESAREAN SECTION    . ENDOMETRIAL ABLATION    . GASTRIC BYPASS  2001  . SBO  2015    There were no vitals filed for this visit.  Subjective Assessment - 05/05/19 1534    Subjective  Pt. reporting MD approved additional therapy for 4 more weeks at twice a week.    Pertinent History  kidney stone, depression, bipolar, anxiety, anemia    Patient Stated Goals  return to work    Currently in Pain?  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    Pain Relieving Factors  goes away on its own with 2 hours rest after pushing cart for whole shopping trip    Multiple Pain Sites  No                       [facility] Adult PT Treatment/Exercise - 05/05/19 0001      Therapeutic Activites    Therapeutic Activities  Work Economist    Work Scientist, research (physical sciences) exercises with Sport and exercise psychologist with 5# weight inside; Simulating picking up and inserting blood vile transport container in tube system for work;  pushed 60# sled with figure-8 pattern to simulate pushing heavy work Scientist, research (life sciences) through patient rooms       Elbow Exercises   Forearm Supination  Left;10 reps;AROM    Forearm Supination Limitations  with wooden dowel - 3/5 grip position as to produce some resistance near center of wooden dowel     Forearm  Pronation  Left;10 reps;AROM    Forearm Pronation Limitations  with wooden dowel near mid grip 3/5 position as to produce mild resistance       Shoulder Exercises: ROM/Strengthening   UBE (Upper Arm Bike)  L3.0 x 3 min forward/3 min backwards    Lat Pull  15 reps    Lat Pull Limitations  15# wide grip wip (with towel)     Cybex Row  15 reps    Cybex Row Limitations  25#; low row with towel around low handle       Hand Exercises   Other Hand Exercises  Stovell grip squeezer ( yellow spring):  25# 5" hold x 20 reps   2 sets; one set with elbow bent one with elbow straight      Wrist Exercises   Wrist Flexion  Left;15 reps;Seated;Bar weights/barbell;Strengthening    Bar Weights/Barbell (Wrist Flexion)  Other (comment)   6#   Wrist Extension  Left;10 reps;Seated;Bar weights/barbell;Strengthening    Bar Weights/Barbell (Wrist Extension)  Other (comment)   6#              PT Short Term Goals - 03/31/19 1633      PT SHORT TERM GOAL #1   Title  Independent w/ inital HEP     Time  3    Period  Weeks    Status   Achieved    Target Date  12/31/18        PT Long Term Goals - 03/31/19 1633      PT LONG TERM GOAL #1   Title  Independent w/ advanced HEP.    Time  4    Period  Weeks    Status  Partially Met      PT LONG TERM GOAL #2   Title  Pt. will demonstrate an increase in Lt elbow and wrist AROM to WNL to increase function.    Time  4    Period  Weeks    Status  Partially Met      PT LONG TERM GOAL #3   Title  Pt. will demonstrate increase in L elbow/wrist strength to 5/5 and Lt grip strength to >40 lbs to allow for increased elbow stability and functional use of arm.    Baseline  L grip with elbow at 90 degrees: 25ppsi, L grip with elbow extended: 22.5 ppsi (average of 3 trials), MMT: grossly 4/5    Period  Weeks    Status  Partially Met      PT LONG TERM GOAL #4   Title  Patient to report 90% improvement in L elbow pain.    Baseline  Pt reporting ability to do perform ADL's with tolerable pain depending on activity. Pt feels she has definitely improved since beginning therapy.    Period  Weeks    Status  Partially Met      PT LONG TERM GOAL #5   Title  Pt will be able to perform her ususal ADL's and work duties with less than 2/10 overall pain.    Baseline  Pt reporting that most days she is able to perform her ALD's with 2/10 pain but some days are worse.    Time  4    Period  Weeks    Status  Partially Met            Plan - 05/05/19 1539    Clinical Impression Statement  Pt. noting she has been able to get to the home  program 2-3/day.  Reports MD wanting her to continue with physical therapy at 2x/week for 4 more weeks.  Has been focusing on wide gripping and pinch gripping exercises at home.  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  Pt. reporting pain intensity and duration has ben lessened over this past week.  Tolerated progression of machine pulling activities with wide  grip and pronation/supination strengthening well without significant increase in pain.  Ended visit with pt. declining modalities.    Comorbidities  kidney stone, depression, bipolar, anxiety, anemia    Rehab Potential  Good    PT Treatment/Interventions  ADLs/Self Care Home Management;Cryotherapy;Electrical Stimulation;Moist Heat;Therapeutic exercise;Therapeutic activities;Ultrasound;Neuromuscular re-education;Patient/family education;Manual techniques;Vasopneumatic Device;Taping;Splinting;Energy conservation;Dry needling;Passive range of motion;Scar mobilization    PT Next Visit Plan  continue to simulate ADL and work activities requiring full-handed grasp, pinch grip activities    PT Home Exercise Plan  lifting, reaching, instructed in taking jar tops or spice jar tops on and off; pt. issued green theraputty for 3-point pinch exercises;  hold grip on textbook    Consulted and Agree with Plan of Care  Patient       Patient will benefit from skilled therapeutic intervention in order to improve the following deficits and impairments:  Hypomobility, Increased edema, Decreased scar mobility, Decreased activity tolerance, Increased fascial restricitons, Pain, Impaired UE functional use, Improper body mechanics, Decreased range of motion, Impaired flexibility, Postural dysfunction  Visit Diagnosis: Pain in left elbow  Stiffness of left elbow, not elsewhere classified  Stiffness of left wrist, not elsewhere classified  Muscle weakness (generalized)     Problem List Patient Active Problem List   Diagnosis Date Noted  . Bipolar disorder, in full remission, most recent episode depressed ([place]) 04/23/2019  . Right carpal tunnel syndrome 12/09/2018  . Closed nondisplaced fracture of fifth left metatarsal bone 05/10/2018  . History of shingles 01/08/2018  . Menopause 01/08/2018  . Left ankle injury, subsequent encounter 08/04/2017  . PCP NOTES >>>>>>>>>> 07/11/2017  . Apneic episode 06/25/2015   . Chronic knee pain 04/18/2015  . Plantar fasciitis 04/18/2015  . Tobacco use disorder 03/15/2015  . Morbid obesity ([place]) 01/05/2015  . Bipolar affective ([place]) 01/02/2015    Bess Harvest, PTA 05/05/19 6:18 PM   [facility] [address]  [place], [place], [address] Phone: [phone]   Fax:  [phone]  Name: Ashley Franklin MRN: [id number] Date of Birth: 1971/03/05

## 2019-05-07 ENCOUNTER — Ambulatory Visit: Payer: PRIVATE HEALTH INSURANCE | Admitting: Physical Therapy

## 2019-05-13 ENCOUNTER — Ambulatory Visit: Payer: PRIVATE HEALTH INSURANCE | Admitting: Physical Therapy

## 2019-05-14 ENCOUNTER — Telehealth: Payer: Self-pay | Admitting: *Deleted

## 2019-05-14 ENCOUNTER — Ambulatory Visit (HOSPITAL_BASED_OUTPATIENT_CLINIC_OR_DEPARTMENT_OTHER)
Admission: RE | Admit: 2019-05-14 | Discharge: 2019-05-14 | Disposition: A | Discharge status: Home / Self Care | Payer: No Typology Code available for payment source | Source: Ambulatory Visit | Attending: Medical | Admitting: Medical

## 2019-05-14 ENCOUNTER — Encounter: Payer: Self-pay | Admitting: Internal Medicine

## 2019-05-14 ENCOUNTER — Ambulatory Visit (INDEPENDENT_AMBULATORY_CARE_PROVIDER_SITE_OTHER): Payer: No Typology Code available for payment source | Admitting: Medical

## 2019-05-14 ENCOUNTER — Encounter: Payer: Self-pay | Admitting: Medical

## 2019-05-14 ENCOUNTER — Other Ambulatory Visit: Payer: Self-pay

## 2019-05-14 ENCOUNTER — Telehealth: Payer: Self-pay | Admitting: Internal Medicine

## 2019-05-14 VITALS — BP 120/80 | HR 88 | Temp 97.7°F | Resp 18 | Ht 64.0 in | Wt 263.0 lb

## 2019-05-14 DIAGNOSIS — R109 Unspecified abdominal pain: Secondary | ICD-10-CM

## 2019-05-14 DIAGNOSIS — M545 Low back pain, unspecified: Secondary | ICD-10-CM

## 2019-05-14 LAB — COMPREHENSIVE METABOLIC PANEL WITH GFR
AG Ratio: 1.5 (calc) (ref 1.0–2.5)
ALT: 10 U/L (ref 6–29)
AST: 13 U/L (ref 10–35)
Albumin: 3.6 g/dL (ref 3.6–5.1)
Alkaline phosphatase (APISO): 72 U/L (ref 31–125)
BUN: 14 mg/dL (ref 7–25)
CO2: 25 mmol/L (ref 20–32)
Calcium: 8.7 mg/dL (ref 8.6–10.2)
Chloride: 108 mmol/L (ref 98–110)
Creat: 0.61 mg/dL (ref 0.50–1.10)
Globulin: 2.4 g/dL (ref 1.9–3.7)
Glucose, Bld: 88 mg/dL (ref 65–99)
Potassium: 4.3 mmol/L (ref 3.5–5.3)
Sodium: 141 mmol/L (ref 135–146)
Total Bilirubin: 0.4 mg/dL (ref 0.2–1.2)
Total Protein: 6 g/dL — ABNORMAL LOW (ref 6.1–8.1)

## 2019-05-14 LAB — CBC WITH DIFFERENTIAL/PLATELET
Absolute Monocytes: 844 cells/uL (ref 200–950)
Basophils Absolute: 30 cells/uL (ref 0–200)
Basophils Relative: 0.4 %
Eosinophils Absolute: 81 cells/uL (ref 15–500)
Eosinophils Relative: 1.1 %
HCT: 35.7 % (ref 35.0–45.0)
Hemoglobin: 12.1 g/dL (ref 11.7–15.5)
Lymphs Abs: 2005 cells/uL (ref 850–3900)
MCH: 30.9 pg (ref 27.0–33.0)
MCHC: 33.9 g/dL (ref 32.0–36.0)
MCV: 91.1 fL (ref 80.0–100.0)
MPV: 9.6 fL (ref 7.5–12.5)
Monocytes Relative: 11.4 %
Neutro Abs: 4440 cells/uL (ref 1500–7800)
Neutrophils Relative %: 60 %
Platelets: 188 10*3/uL (ref 140–400)
RBC: 3.92 10*6/uL (ref 3.80–5.10)
RDW: 12.7 % (ref 11.0–15.0)
Total Lymphocyte: 27.1 %
WBC: 7.4 10*3/uL (ref 3.8–10.8)

## 2019-05-14 LAB — LIPASE: Lipase: 33 U/L (ref 7–60)

## 2019-05-14 LAB — TROPONIN I: Troponin I: <0.01 ng/mL (ref <=0.0)

## 2019-05-14 LAB — POCT URINALYSIS DIP (MANUAL ENTRY)
Blood, UA: NEGATIVE
Glucose, UA: NEGATIVE mg/dL
Ketones, POC UA: NEGATIVE mg/dL
Leukocytes, UA: NEGATIVE
Nitrite, UA: NEGATIVE
Protein Ur, POC: NEGATIVE mg/dL
Spec Grav, UA: >=1.03 — AB (ref 1.010–1.025)
Urobilinogen, UA: 0.2 E.U./dL
pH, UA: 6 (ref 5.0–8.0)

## 2019-05-14 MED ORDER — HYOSCYAMINE SULFATE 0.125 MG PO TBDP
0.2500 mg | ORAL_TABLET | Freq: Once | ORAL | Status: AC
  Administered 2019-05-14: 0.25 mg via SUBLINGUAL

## 2019-05-14 MED ORDER — LIDOCAINE VISCOUS HCL 2 % MT SOLN
15.0000 mL | Freq: Once | OROMUCOSAL | Status: AC
  Administered 2019-05-14: 15 mL via OROMUCOSAL

## 2019-05-14 MED ORDER — ALUM & MAG HYDROXIDE-SIMETH 400-400-40 MG/5ML PO SUSP
30.0000 mL | Freq: Once | ORAL | Status: AC
  Administered 2019-05-14: 30 mL via ORAL

## 2019-05-14 MED ORDER — FAMOTIDINE 20 MG PO TABS: 20.0000 mg | ORAL_TABLET | Freq: Two times a day (BID) | ORAL | Status: DC

## 2019-05-14 MED ORDER — ONDANSETRON 4 MG PO TBDP: 4.0000 mg | ORAL_TABLET | Freq: Three times a day (TID) | ORAL | 0 refills | Status: DC | PRN

## 2019-05-14 MED FILL — FAMOTIDINE 20 MG TABS: 20 | 30 days supply | Qty: 60 | Fill #0

## 2019-05-14 MED FILL — ONDANSETRON ODT 4 MG TABLET: 4 | 6 days supply | Qty: 20 | Fill #0

## 2019-05-14 NOTE — Progress Notes (Signed)
Subjective:    Patient ID: Ashley Franklin, female    DOB: [birth year], 48 y.o.   MRN: [id number]  HPI  Pt in with symptoms for about 10 days ago when she was lying down she would have acidic feel in stomach area and taste of acid in mouth. Patient does not have any pain after eating.   In addition 5 days ago she developed lower back pain both side. She mentioned hx of kidney stones in the past.   Also pt states she has been constipated recently. Pt states tried colon cleanser otc, miralax and generic ex-lax. She states had not had bm for 5 day. Then Saturday and Sunday took otc products. She had some loose stools since Monday. About 6 loose stools a day. Pt has had gastric bypass surgery in the past. Pain in her lower back and shoulder blades. Hx of bowel obstruction about 2013.  Pt feels some nausea but has not been vomiting.   Pt expresses that she does not want to go to ED when I mentioned some concern about her level of discomfort and this makes me consider possible need.    Review of Systems  Constitutional: Negative for chills and fever.  Respiratory: Negative for cough, choking, chest tightness, shortness of breath and wheezing.   Cardiovascular: Negative for chest pain and palpitations.       None reported. No associated symptoms cardiac like. Only pain lying supine acid like/reflux per pt.  Gastrointestinal: Positive for abdominal pain and diarrhea. Negative for abdominal distention and nausea.       Loose stools but preceded by constipation.  Musculoskeletal: Positive for back pain.       Lower back and some upper back pain.  Upper back pain with low back pain.  Skin: Negative for rash.  Hematological: Negative for adenopathy. Does not bruise/bleed easily.  Psychiatric/Behavioral: Negative for behavioral problems and suicidal ideas. The patient is not nervous/anxious.     Past Medical History:  Diagnosis Date  . Anemia   . Anxiety   . Bipolar affective disorder ([place])    . Blood transfusion without reported diagnosis   . Bronchospasm with bronchitis, acute   . Depression   . Kidney stone   . Right carpal tunnel syndrome 12/09/2018  . SBO (small bowel obstruction) ([place]) 01/2014     Social History   Socioeconomic History  . Marital status: Married    Spouse name: Not on file  . Number of children: 2  . Years of education: Not on file  . Highest education level: Not on file  Occupational History  . Occupation: phelbotomist    Employer: [employer]  Tobacco Use  . Smoking status: Current Every Day Smoker    Packs/day: 0.25    Years: 6.00    Pack years: 1.50  . Smokeless tobacco: Never Used  . Tobacco comment: 1/2 ppd   Substance and Sexual Activity  . Alcohol use: Yes    Alcohol/week: 0.0 standard drinks    Comment: Two times a week.   . Drug use: No  . Sexual activity: Yes    Partners: Male    Birth control/protection: Surgical  Other Topics Concern  . 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 Difficulty of Paying Living Expenses: Not on file  Food Insecurity:   . Worried About Programme researcher, broadcasting/film/videounning Out of Food in the Last Year: Not on file  . Ran Out of Food in the Last Year: Not on file  Transportation Needs:   . Lack of Transportation (Medical): Not on file  . Lack of Transportation (Non-Medical): Not on file  Physical Activity:   . Days of Exercise per Week: Not on file  . Minutes of Exercise per Session: Not on file  Stress:   . Feeling of Stress : Not on file  Social Connections:   . Frequency of Communication with Friends and Family: Not on file    . Frequency of Social Gatherings with Friends and Family: Not on file  . Attends Religious Services: Not on file  . Active Member of Clubs or Organizations: Not on file  . Attends BankerClub or Organization Meetings: Not on file  . Marital Status: Not on file  Intimate Partner Violence:   . Fear of Current or Ex-Partner: Not on file  . Emotionally Abused: Not on file  . Physically Abused: Not on file  . Sexually Abused: Not on file    Past Surgical History:  Procedure Laterality Date  . CESAREAN SECTION    . ENDOMETRIAL ABLATION    . GASTRIC BYPASS  2001  . SBO  2015    Family History  Problem Relation Age of Onset  . Diabetes Mother   . Cancer Mother 60       Throat   . Bipolar disorder Mother   . Bipolar disorder Sister   . Bipolar disorder Brother   . Drug abuse Brother   . Bipolar disorder Sister   . Colon cancer Neg Hx   . Cancer - Prostate Neg Hx   . Breast cancer Neg Hx     Allergies  Allergen Reactions  . Nsaids Other (See Comments)    Contraindicated with gastric bypass    Current Outpatient Medications on File Prior to Visit  Medication Sig Dispense Refill  . divalproex (DEPAKOTE ER) 500 MG 24 hr tablet Take 4 tablets (2,000 mg total) by mouth at bedtime. 360 tablet 1  . FLUoxetine (PROZAC) 20 MG tablet Take 4 tablets (80 mg total) by mouth daily. 360 tablet 1  . lamoTRIgine (LAMICTAL) 25 MG tablet Take 2 tablets (50 mg total) by mouth daily. 180 tablet 1  . Lurasidone HCl 60 MG TABS Take 1 tablet (60 mg total) by mouth daily. 90 tablet 1   No current facility-administered medications on file prior to visit.    BP 120/80 (BP Location: Right Arm, Patient Position: Sitting, Cuff Size: Large)   Pulse 88   Temp 97.7 F (36.5 C) (Temporal)   Resp 18   Ht 5\' 4"  (1.626 m)   Wt 263 lb (119.3 kg)   SpO2 96%   BMI 45.14 kg/m       Objective:   Physical Exam  General Mental Status- Alert. General Appearance- Not in acute distress.   Skin General:  Color- Normal Color. Moisture- Normal Moisture.  Neck Carotid Arteries- Normal color. Moisture- Normal Moisture. No carotid bruits. No JVD.  Chest and Lung Exam Auscultation: Breath Sounds:-Normal.  Cardiovascular Auscultation:Rythm- Regular. Murmurs &  Other Heart Sounds:Auscultation of the heart reveals- No Murmurs.  Abdomen Inspection:-Inspeection Normal. Palpation/Percussion:Note:No mass. Palpation and Percussion of the abdomen reveal- Tender mild moderate lower quadrants., Non Distended + BS, no rebound or guarding.    Neurologic Cranial Nerve exam:- CN III-XII intact(No nystagmus), symmetric smile. Strength:- 5/5 equal and symmetric strength both upper and lower extremities.  Back- bilateral cva and upper back region pain.      Assessment & Plan:1  [phone].   You have mixture of symptom today. Some gerd like. We gave magic mouth wash and will rx famotadine. Eat healthy diet.  For recent constipation followed by loose stool will get 1 view abd xray. Stay in xray until I get report. If dilated bowel/concern for obstruction then ED evaluation. If large amount of stool present then may recommend magnesium citrate.  Will get urine culture. No blood seen so kidney stone not likely.  Not suspicious for gb etiology presently.  For nausea rx zofran  For caution sake did ekg today and decided to get one set troponin.   If signs/symptoms worsen or persist despite work up and treatment then recommend ED evaluation. Short holiday week/closed past Thursday morning.  Follow up date to be determined after lab review and xray review.   40 minutes pent with pt. 50% of time spent counseling on plan going forward.   Esperanza Richters, PA-C

## 2019-05-14 NOTE — Telephone Encounter (Signed)
Ashley Franklin, please see patient's message. See if you can get her seen by one of our providers or perhaps a provider at a different location Please remove me from her chart, I am not longer her PCP.

## 2019-05-14 NOTE — Telephone Encounter (Signed)
Attempted to call on call provider, no answer, left message to call regarding pt's xray report. Called patient back and advised her to go home and if she starts having severe pain or feeling worst to go to the ED. She voiced understanding.

## 2019-05-14 NOTE — Patient Instructions (Addendum)
You have mixture of symptom today. Some gerd like. We gave magic mouth wash and will rx famotadine. Eat healthy diet.  For recent constipation followed by loose stool will get 1 view abd xray. Stay in xray until I get report. If dilated bowel/concern for obstruction then ED evaluation. If large amount of stool present then may recommend magnesium citrate.  Will get urine culture. No blood seen so kidney stone not likely.  Not suspicious for gb etiology presently.  For nausea rx zofran  For caution sake did ekg today and decided to get one set troponin. Negative t waves today but ekg looks virtually exact same 02-05-1999.  If signs/symptoms worsen or persist despite work up and treatment then recommend ED evaluation. Short holiday week/closed past Thursday morning.  Follow up date to be determined after lab review and xray review.

## 2019-05-14 NOTE — Telephone Encounter (Signed)
Pt called to get results of her abd xray from today. She was advised by E.Saguier, PA to stay in radiology until he gets the result of that xray. She stated that she had been there since about 4 and had not heard anything. Per chart he possibly may need to send her to the ED or home. Attempted to notify the provider on call, no answer, left message to call  [facility] back.

## 2019-05-15 ENCOUNTER — Encounter: Payer: Self-pay | Admitting: Internal Medicine

## 2019-05-15 ENCOUNTER — Ambulatory Visit: Payer: PRIVATE HEALTH INSURANCE | Admitting: Physical Therapy

## 2019-05-15 ENCOUNTER — Encounter: Payer: Self-pay | Admitting: Medical

## 2019-05-16 LAB — URINE CULTURE
MICRO NUMBER:: 1241119
Result:: NO GROWTH
SPECIMEN QUALITY:: ADEQUATE

## 2019-05-20 ENCOUNTER — Other Ambulatory Visit: Payer: Self-pay

## 2019-05-20 ENCOUNTER — Ambulatory Visit: Payer: PRIVATE HEALTH INSURANCE | Attending: Orthopedic Surgery

## 2019-05-20 DIAGNOSIS — M6281 Muscle weakness (generalized): Secondary | ICD-10-CM | POA: Diagnosis present

## 2019-05-20 DIAGNOSIS — M25632 Stiffness of left wrist, not elsewhere classified: Secondary | ICD-10-CM | POA: Insufficient documentation

## 2019-05-20 DIAGNOSIS — M25522 Pain in left elbow: Secondary | ICD-10-CM | POA: Diagnosis present

## 2019-05-20 DIAGNOSIS — M25622 Stiffness of left elbow, not elsewhere classified: Secondary | ICD-10-CM | POA: Insufficient documentation

## 2019-05-20 NOTE — Therapy (Signed)
[facility] [address]  [place], [place], [address] Phone: [phone]   Fax:  [phone]  Physical Therapy Treatment  Patient Details  Name: Ashley [facility] MRN: [id number] Date of Birth: [birth year] Referring Provider (PT): Ashley Jakob, MD   Encounter Date: 05/20/2019  PT End of Session - 05/20/19 1534    Visit Number  39    Number of Visits  75    Date for PT Re-Evaluation  05/26/19    Authorization Type  WC: 1 eval + 8 tx + 8 tx +8 tx + 8tx + 8tx + 6tx    Authorization Time Period  progress note sent on 02/19/2019 (20th visit)    Authorization - Visit Number  39    Authorization - Number of Visits  30    PT Start Time  1529    PT Stop Time  1610    PT Time Calculation (min)  41 min    Activity Tolerance  Patient tolerated treatment well    Behavior During Therapy  WFL for tasks assessed/performed       Past Medical History:  Diagnosis Date  . Anemia   . Anxiety   . Bipolar affective disorder ([facility])   . Blood transfusion without reported diagnosis   . Bronchospasm with bronchitis, acute   . Depression   . Kidney stone   . Right carpal tunnel syndrome 12/09/2018  . SBO (small bowel obstruction) ([place]) 01/2014    Past Surgical History:  Procedure Laterality Date  . CESAREAN SECTION    . ENDOMETRIAL ABLATION    . GASTRIC BYPASS  2001  . SBO  2015    There were no vitals filed for this visit.  Subjective Assessment - 05/20/19 1532    Subjective  Pt. reporting she may have overdone it a bit with "packing up boxes at the house".    Pertinent History  kidney stone, depression, bipolar, anxiety, anemia    Patient Stated Goals  return to work    Currently in Pain?  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      Elbow Exercises   Forearm Supination  Left;10 reps;AROM    Forearm Supination Limitations  end grip golf club    Forearm Pronation  Left;10 reps;AROM    Forearm Pronation Limitations  end grip on golf club      Shoulder Exercises: Prone   Retraction  Left;10 reps    Retraction Limitations  bent over row: 10# towel around grip to widen grasp    2 sets      Shoulder Exercises: ROM/Strengthening   UBE (Upper Arm Bike)  L3.0 x 3 min forward/3  min backwards    Lat Pull  10 reps    Lat Pull Limitations  20 narrow grip     Cybex Row  10 reps    Cybex Row Limitations  25#; low row, mid row    2 sets      Hand Exercises   Other Hand Exercises  10# L hand dumbbell grasp (on bell) 2 x 20 sec       Wrist Exercises   Wrist Flexion  Left;10 reps;Seated;Strengthening;Bar weights/barbell   2 sets    Bar Weights/Barbell (Wrist Flexion)  --   7# dumbbell    Wrist Extension  Left;10 reps;Seated;Bar weights/barbell;Strengthening   2 sets    Bar Weights/Barbell (Wrist Extension)  --   7# dumbbell    Other wrist exercises  L wrist flexor, extensor stretch 2 x 30 sec each way                PT Short Term Goals - 03/31/19 1633      PT SHORT TERM GOAL #1   Title  Independent w/ inital HEP     Time  3    Period  Weeks    Status  Achieved    Target Date  12/31/18        PT Long Term Goals - 03/31/19 1633      PT LONG TERM GOAL #1   Title  Independent w/ advanced HEP.    Time  4    Period  Weeks    Status  Partially Met      PT LONG TERM GOAL #2   Title  Pt. will demonstrate an increase in Lt elbow and wrist AROM to  WNL to increase function.    Time  4    Period  Weeks    Status  Partially Met      PT LONG TERM GOAL #3   Title  Pt. will demonstrate increase in L elbow/wrist strength to 5/5 and Lt grip strength to >40 lbs to allow for increased elbow stability and functional use of arm.    Baseline  L grip with elbow at 90 degrees: 25ppsi, L grip with elbow extended: 22.5 ppsi (average of 3 trials), MMT: grossly 4/5    Period  Weeks    Status  Partially Met      PT LONG TERM GOAL #4   Title  Patient to report 90% improvement in L elbow pain.    Baseline  Pt reporting ability to do perform ADL's with tolerable pain depending on activity. Pt feels she has definitely improved since beginning therapy.    Period  Weeks    Status  Partially Met      PT LONG TERM GOAL #5   Title  Pt will be able to perform her ususal ADL's and work duties with less than 2/10 overall pain.    Baseline  Pt reporting that most days she is able to perform her ALD's with 2/10 pain but some days are worse.    Time  4    Period  Weeks    Status  Partially Met            Plan - 05/20/19 1535    Clinical Impression Statement  Spring reporting she has been performing HEP daily.  Noting she has been having increased L lateral elbow pain over this past week however attribute this to having to lift and pack up Christmas decorations.  Able to progress eccentric wrist extensor strengthening  and advance difficulty with work simulation activities in session today with typical rise in lateral elbow pain which recovers within a few min rest.  Ended visit with elbow pain recovering to baseline.     Comorbidities  kidney stone, depression, bipolar, anxiety, anemia    Rehab Potential  Good    PT Treatment/Interventions  ADLs/Self Care Home Management;Cryotherapy;Electrical Stimulation;Moist Heat;Therapeutic exercise;Therapeutic activities;Ultrasound;Neuromuscular re-education;Patient/family education;Manual techniques;Vasopneumatic  Device;Taping;Splinting;Energy conservation;Dry needling;Passive range of motion;Scar mobilization    PT Next Visit Plan  continue to simulate ADL and work activities requiring full-handed grasp, pinch grip activities    PT Home Exercise Plan  lifting, reaching, instructed in taking jar tops or spice jar tops on and off; pt. issued green theraputty for 3-[place]lin pinch exercises;  hold grip on textbook    Consulted and Agree with Plan of Care  Patient       Patient will benefit from skilled therapeutic intervention in order to improve the following deficits and impairments:  Hypomobility, Increased edema, Decreased scar mobility, Decreased activity tolerance, Increased fascial restricitons, Pain, Impaired UE functional use, Improper body mechanics, Decreased range of motion, Impaired flexibility, Postural dysfunction  Visit Diagnosis: Pain in left elbow  Stiffness of left elbow, not elsewhere classified  Stiffness of left wrist, not elsewhere classified  Muscle weakness (generalized)     Problem List Patient Active Problem List   Diagnosis Date Noted  . Bipolar disorder, in full remission, most recent episode depressed ([place]) 04/23/2019  . Right carpal tunnel syndrome 12/09/2018  . Closed nondisplaced fracture of fifth left metatarsal bone 05/10/2018  . History of shingles 01/08/2018  . Menopause 01/08/2018  . Left ankle injury, subsequent encounter 08/04/2017  . PCP NOTES >>>>>>>>>> 07/11/2017  . 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 Chronic knee pain 04/18/2015  . Plantar fasciitis 04/18/2015  . Tobacco use disorder 03/15/2015  . Morbid obesity ([place]) 01/05/2015  . Bipolar affective ([place]) 01/02/2015    Bess Harvest, PTA 05/20/19 6:35 PM    [facility] [address]  [place], [place], [address] Phone: [phone]   Fax:  [phone]  Name: [facility] MRN: [id number] Date of Birth: 03-21-1971

## 2019-05-22 ENCOUNTER — Ambulatory Visit: Payer: PRIVATE HEALTH INSURANCE | Admitting: Physical Therapy

## 2019-05-22 ENCOUNTER — Encounter: Payer: Self-pay | Admitting: Physical Therapy

## 2019-05-22 DIAGNOSIS — M6281 Muscle weakness (generalized): Secondary | ICD-10-CM

## 2019-05-22 DIAGNOSIS — M25522 Pain in left elbow: Secondary | ICD-10-CM | POA: Diagnosis not present

## 2019-05-22 DIAGNOSIS — M25622 Stiffness of left elbow, not elsewhere classified: Secondary | ICD-10-CM

## 2019-05-22 DIAGNOSIS — M25632 Stiffness of left wrist, not elsewhere classified: Secondary | ICD-10-CM

## 2019-05-22 NOTE — Therapy (Signed)
[facility] [address]  [place], [place], [address] Phone: [phone]   Fax:  [phone]  Physical Therapy Progress Note  Patient Details  Name: [facility] MRN: [id number] Date of Birth: 1970/11/30 Referring Provider (PT): Milly Jakob, MD   Encounter Date: 05/22/2019  PT End of Session - 05/22/19 1616    Visit Number  40    Number of Visits  61    Date for PT Re-Evaluation  05/26/19    Authorization Type  WC: 1 eval + 8 tx + 8 tx +8 tx + 8tx + 8tx + 6tx    Authorization Time Period  --    Authorization - Visit Number  72    Authorization - Number of Visits  32    PT Start Time  1520    PT Stop Time  1600    PT Time Calculation (min)  40 min    Activity Tolerance  Patient tolerated treatment well;Patient limited by pain    Behavior During Therapy  [facility] for tasks assessed/performed       Past Medical History:  Diagnosis Date  . Anemia   . Anxiety   . Bipolar affective disorder ([place])   . Blood transfusion without reported diagnosis   . Bronchospasm with bronchitis, acute   . Depression   . Kidney stone   . Right carpal tunnel syndrome 12/09/2018  . SBO (small bowel obstruction) ([place]) 01/2014    Past Surgical History:  Procedure Laterality Date  . CESAREAN SECTION    . ENDOMETRIAL ABLATION    . GASTRIC BYPASS  2001  . SBO  2015    There were no vitals filed for this visit.  Subjective Assessment - 05/22/19 1518    Subjective  Feeling better since her kidney infection. L elbow has been doing really well. Pretty much back to baseline from flare up caused by taking christmas decorations down. Worried about returning to work full time d/t concern about heavy lifting/pushing during a long shift.    Pertinent History  kidney stone, depression, bipolar, anxiety, anemia    Patient Stated Goals  return to work    Currently in Pain?  Yes    Pain Score  2     Pain Location  Elbow    Pain Orientation   Left    Pain Descriptors / Indicators  Aching    Pain Type  Acute pain         [facility] PT Assessment - 05/22/19 0001      Assessment   Medical Diagnosis  s/p L lateral epicondylitis debridement    Referring Provider (PT)  Milly Jakob, MD    Onset Date/Surgical Date  11/29/18      Observation/Other Assessments   Focus on Therapeutic Outcomes (FOTO)   Elbow: 62 (38% limited, 38% predicted)      AROM   Left Elbow Flexion  144    Left Elbow Extension  -1    Left Wrist Extension  80 Degrees    Left Wrist Flexion  74 Degrees    Left Wrist Radial Deviation  25 Degrees    Left Wrist Ulnar Deviation  42 Degrees      Strength   Left Elbow Flexion  5/5    Left Elbow Extension  5/5    Left Wrist Flexion  4+/5    Left Wrist Extension  5/5    Left Wrist Radial Deviation  5/5    Left  Wrist Ulnar Deviation  4+/5    Left Hand Grip (lbs)  40   45, 40, 35                  [facility] Adult PT Treatment/Exercise - 05/22/19 0001      Shoulder Exercises: Prone   Retraction  Left;10 reps    Retraction Limitations  bent over row: 10# towel around grip to widen grasp     Other Prone Exercises  bend over L tricep kick back 5x each of 1#, 2#, 3#, 4#; with towel around handle 3# x5   very mild tingling at elbow     Shoulder Exercises: Standing   Other Standing Exercises  farmer's carry in L hand with 10# with towel wrapped around handle 2 min   5/10 pain     Shoulder Exercises: ROM/Strengthening   UBE (Upper Arm Bike)  L3.0 x 3 min forward/3 min backwards      Shoulder Exercises: Stretch   Other Shoulder Stretches  L wrist flexion/extension stretch with straight elbow 30" each to tolerance             PT Education - 05/22/19 1615    Education Details  discussion on objective progress and remaining goals; discussion on patient's limitations and remaining difficulty with ADLs and work tasks, Dentist on work conditioning and how it may increase endurance with tasks    Person(s)  Educated  Patient    Methods  Explanation;Demonstration    Comprehension  Verbalized understanding       PT Short Term Goals - 05/22/19 1525      PT SHORT TERM GOAL #1   Title  Independent w/ inital HEP     Time  3    Period  Weeks    Status  Achieved    Target Date  12/31/18        PT Long Term Goals - 05/22/19 1525      PT LONG TERM GOAL #1   Title  Independent w/ advanced HEP.    Time  4    Period  Weeks    Status  Partially Met    Target Date  05/26/19      PT LONG TERM GOAL #2   Title  Pt. will demonstrate an increase in Lt elbow and wrist AROM to WNL to increase function.    Time  4    Period  Weeks    Status  Achieved    Target Date  05/26/19      PT LONG TERM GOAL #3   Title  Pt. will demonstrate increase in L elbow/wrist strength to 5/5 and Lt grip strength to >40 lbs to allow for increased elbow stability and functional use of arm.    Baseline  L grip with elbow at 90 degrees: 25ppsi, L grip with elbow extended: 22.5 ppsi (average of 3 trials), MMT: grossly 4/5    Period  Weeks    Status  Partially Met   L elbow strength has improvement in flexion and extension and wrist strength improved in extension and radial deviation   Target Date  05/26/19      PT LONG TERM GOAL #4   Title  Patient to report 90% improvement in L elbow pain.    Baseline  Pt reporting ability to do perform ADL's with tolerable pain depending on activity. Pt feels she has definitely improved since beginning therapy.    Period  Weeks    Status  Partially Met  75-80% improvement in pain   Target Date  05/26/19      PT LONG TERM GOAL #5   Title  Pt will be able to perform her ususal ADL's and work duties with less than 2/10 overall pain.    Baseline  Pt reporting that most days she is able to perform her ALD's with 2/10 pain but some days are worse.    Time  4    Period  Weeks    Status  Partially Met   3-4/10 at worse while performing tasks at home   Target Date  05/26/19       Additional Long Term Goals   Additional Long Term Goals  Yes      PT LONG TERM GOAL #6   Title  Patient to report tolerance for pushing 70 lb sled for 5 minutes with 0/10 pain.    Status  New    Target Date  05/26/19      PT LONG TERM GOAL #7   Title  Patient to carry 10lb weight in L hand for 5 min without increase in pain.    Status  New    Target Date  05/26/19            Plan - 05/22/19 1632    Clinical Impression Statement  Patient reporting L elbow pain returning to baseline after recent flare up. Notes 75-80% improvement in pain levels since surgery. Pain rises to 3-4/10 at worse while performing tasks at home. L elbow and wrist AROM are now WNL and non-painful- patient has now met this goal. Strength goal is nearly met- L elbow strength has improvement in flexion and extension and wrist strength improved in extension and radial deviation. Patient has met or partially met all set goals at this time. Discussed which ADL and work activates patient feels still challenge her, with patient reporting remaining difficulty with carrying a heavy trash bag, opening jars, turning a cart to the R. 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 Adjusted goals to reflect patient's current challenges. Believe that patient would potentially benefit from work conditioning program as this would better address patient's need to work up to tolerating longer durations of activity. Patient in agreement. Challenged wide grip lifting and strengthening activities today with patient reporting up to 5/10 pain with farmer's carry. Declined modalities at end of session.    Comorbidities  kidney stone, depression, bipolar, anxiety, anemia    Rehab Potential  Good    PT Treatment/Interventions  ADLs/Self Care Home Management;Cryotherapy;Electrical Stimulation;Moist Heat;Therapeutic exercise;Therapeutic  activities;Ultrasound;Neuromuscular re-education;Patient/family education;Manual techniques;Vasopneumatic Device;Taping;Splinting;Energy conservation;Dry needling;Passive range of motion;Scar mobilization    PT Next Visit Plan  continue to simulate ADL and work activities requiring full-handed grasp, pinch grip activities    PT Home Exercise Plan  lifting, reaching, instructed in taking jar tops or spice jar tops on and off; pt. issued green theraputty for 3-point pinch exercises;  hold grip on textbook    Consulted and Agree with Plan of Care  Patient       Patient will benefit from skilled therapeutic intervention in order to improve the following deficits and impairments:  Hypomobility, Increased edema, Decreased scar mobility, Decreased activity tolerance, Increased fascial restricitons, Pain, Impaired UE functional use, Improper body mechanics, Decreased range of motion, Impaired flexibility, Postural dysfunction  Visit Diagnosis: Pain in left elbow  Stiffness of left elbow, not elsewhere classified  Stiffness of left wrist, not elsewhere classified  Muscle weakness (generalized)     Problem List Patient Active Problem List   Diagnosis Date Noted  . Bipolar disorder, in full remission, most recent episode depressed ([place]) 04/23/2019  . Right carpal tunnel syndrome 12/09/2018  . Closed nondisplaced fracture of fifth left metatarsal bone 05/10/2018  . History of shingles 01/08/2018  . Menopause 01/08/2018  . Left ankle injury, subsequent encounter 08/04/2017  . PCP NOTES >>>>>>>>>> 07/11/2017  . 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 Chronic knee pain 04/18/2015  . Plantar fasciitis 04/18/2015  . Tobacco use disorder 03/15/2015  . Morbid obesity ([place]) 01/05/2015  . Bipolar affective ([place]) 01/02/2015     Janene Harvey, PT, DPT 05/22/19 4:36 PM   [facility] [address]  [place], [place], [address] Phone:  [phone]   Fax:  [phone]  Name: [facility] MRN: [id number] Date of Birth: 14-Jun-1970

## 2019-05-26 MED FILL — MELOXICAM 7.5 MG TABLET: 7.5 | 30 days supply | Qty: 30 | Fill #0

## 2019-05-27 ENCOUNTER — Ambulatory Visit: Payer: PRIVATE HEALTH INSURANCE

## 2019-05-27 ENCOUNTER — Other Ambulatory Visit: Payer: Self-pay

## 2019-05-27 DIAGNOSIS — M25522 Pain in left elbow: Secondary | ICD-10-CM | POA: Diagnosis not present

## 2019-05-27 DIAGNOSIS — M25632 Stiffness of left wrist, not elsewhere classified: Secondary | ICD-10-CM

## 2019-05-27 DIAGNOSIS — M25622 Stiffness of left elbow, not elsewhere classified: Secondary | ICD-10-CM

## 2019-05-27 DIAGNOSIS — M6281 Muscle weakness (generalized): Secondary | ICD-10-CM

## 2019-05-27 NOTE — Therapy (Signed)
[facility] [address]  [place], [place], [address] Phone: [phone]   Fax:  [phone]  Physical Therapy Treatment  Patient Details  Name: Ashley Franklin MRN: [id number] Date of Birth: 27-Feb-1971 Referring Provider (PT): Milly Jakob, MD   Encounter Date: 05/27/2019  PT End of Session - 05/27/19 1543    Visit Number  41    Number of Visits  44    Date for PT Re-Evaluation  05/26/19    Authorization Type  WC: 1 eval + 8 tx + 8 tx +8 tx + 8tx + 8tx + 6tx    Authorization - Visit Number  78    Authorization - Number of Visits  99    PT Start Time  3888    PT Stop Time  1609    PT Time Calculation (min)  39 min    Activity Tolerance  Patient tolerated treatment well;Patient limited by pain    Behavior During Therapy  [facility] for tasks assessed/performed       Past Medical History:  Diagnosis Date  . Anemia   . Anxiety   . Bipolar affective disorder ([place])   . Blood transfusion without reported diagnosis   . Bronchospasm with bronchitis, acute   . Depression   . Kidney stone   . Right carpal tunnel syndrome 12/09/2018  . SBO (small bowel obstruction) ([place]) 01/2014    Past Surgical History:  Procedure Laterality Date  . CESAREAN SECTION    . ENDOMETRIAL ABLATION    . GASTRIC BYPASS  2001  . SBO  2015    There were no vitals filed for this visit.  Subjective Assessment - 05/27/19 1543    Subjective  Pt. doing well.    Pertinent History  kidney stone, depression, bipolar, anxiety, anemia    Patient Stated Goals  return to work    Currently in Pain?  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 to simulate work-related 100lb cart  pushing navigating in patient rooms   L elbow pain rising to 4/10 at worst      Shoulder Exercises: Standing   Other Standing Exercises  farmer's carry in L hand with 10# with towel wrapped around handle 2.5 min   pain rising to 4/10     Shoulder Exercises: ROM/Strengthening   UBE (Upper Arm Bike)  L3.0 x 3 min forward/3 min backwards    Cybex Row  10 reps   2 sets    Cybex Row Limitations  L only - 15# towel around low handle       Hand Exercises   Other Hand Exercises  Stovell grip squeezer (yellow spring):  35# 5" hold x 10 reps       Wrist Exercises   Wrist Flexion  Left;10 reps;Seated;Strengthening;Bar weights/barbell    Bar Weights/Barbell (Wrist Flexion)  Other (comment)   7# dumbbell    Wrist Extension  Left;10 reps;Seated;Bar weights/barbell;Strengthening    Bar Weights/Barbell (Wrist Extension)  Other (comment)   7# dumbbell - B con/L ecc    Wrist  Radial Deviation  Left;Strengthening;10 reps    Wrist Radial Deviation Limitations  seated with table and golf club gripping near end of handle     Other wrist exercises  L wrist extensors stretch 2 x 30 sec                PT Short Term Goals - 05/22/19 1525      PT SHORT TERM GOAL #1   Title  Independent w/ inital HEP     Time  3    Period  Weeks    Status  Achieved    Target Date  12/31/18        PT Long Term Goals - 05/27/19 1544      PT LONG TERM GOAL #1   Title  Independent w/ advanced HEP.    Time  4    Period  Weeks    Status  Partially Met      PT LONG TERM GOAL #2   Title  Pt. will demonstrate an increase in Lt elbow and wrist AROM to WNL to increase function.    Time  4    Period  Weeks    Status  Achieved      PT LONG TERM GOAL #3   Title  Pt. will demonstrate increase in L elbow/wrist strength to 5/5 and Lt grip strength to >40 lbs to allow for increased elbow stability and functional use of arm.    Baseline  L grip with elbow at 90 degrees: 25ppsi, L grip with elbow extended: 22.5 ppsi  (average of 3 trials), MMT: grossly 4/5    Period  Weeks    Status  Partially Met   L elbow strength has improvement in flexion and extension and wrist strength improved in extension and radial deviation     PT LONG TERM GOAL #4   Title  Patient to report 90% improvement in L elbow pain.    Baseline  Pt reporting ability to do perform ADL's with tolerable pain depending on activity. Pt feels she has definitely improved since beginning therapy.    Period  Weeks    Status  Partially Met   75-80% improvement in pain     PT LONG TERM GOAL #5   Title  Pt will be able to perform her ususal ADL's and work duties with less than 2/10 overall pain.    Baseline  Pt reporting that most days she is able to perform her ALD's with 2/10 pain but some days are worse.    Time  4    Period  Weeks    Status  Partially Met   3-4/10 at worse while performing tasks at home     PT LONG TERM GOAL #6   Title  Patient to report tolerance for pushing 70 lb sled for 5 minutes with 0/10 pain.    Status  On-going      PT LONG TERM GOAL #7   Title  Patient to carry 10lb weight in L hand for 5 min without increase in pain.    Status  On-going            Plan - 05/27/19 1547    Clinical Impression Statement  Pt. reporting she sees MD on Thursday.  Has been performing HEP pinch grip activities with green putty at home consistently.  Tolerated progression of L wrist eccentric extensor strengthening, work simulation with 70# sled push, and progression of farmers carry without elbow pain rising to 4/10 at  most.  Ended visit with pain levels returning to baseline.    Comorbidities  kidney stone, depression, bipolar, anxiety, anemia    Rehab Potential  Good    PT Treatment/Interventions  ADLs/Self Care Home Management;Cryotherapy;Electrical Stimulation;Moist Heat;Therapeutic exercise;Therapeutic activities;Ultrasound;Neuromuscular re-education;Patient/family education;Manual techniques;Vasopneumatic  Device;Taping;Splinting;Energy conservation;Dry needling;Passive range of motion;Scar mobilization    PT Home Exercise Plan  lifting, reaching, instructed in taking jar tops or spice jar tops on and off; pt. issued green theraputty for 3-point pinch exercises;  hold grip on textbook    Consulted and Agree with Plan of Care  Patient       Patient will benefit from skilled therapeutic intervention in order to improve the following deficits and impairments:  Hypomobility, Increased edema, Decreased scar mobility, Decreased activity tolerance, Increased fascial restricitons, Pain, Impaired UE functional use, Improper body mechanics, Decreased range of motion, Impaired flexibility, Postural dysfunction  Visit Diagnosis: Pain in left elbow  Stiffness of left elbow, not elsewhere classified  Stiffness of left wrist, not elsewhere classified  Muscle weakness (generalized)     Problem List Patient Active Problem List   Diagnosis Date Noted  . Bipolar disorder, in full remission, most recent episode depressed ([place]) 04/23/2019  . Right carpal tunnel syndrome 12/09/2018  . Closed nondisplaced fracture of fifth left metatarsal bone 05/10/2018  . History of shingles 01/08/2018  . Menopause 01/08/2018  . Left ankle injury, subsequent encounter 08/04/2017  . PCP NOTES >>>>>>>>>> 07/11/2017  . 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 Chronic knee pain 04/18/2015  . Plantar fasciitis 04/18/2015  . Tobacco use disorder 03/15/2015  . Morbid obesity ([place]) 01/05/2015  . Bipolar affective ([place]) 01/02/2015    Bess Harvest, PTA 05/27/19 4:43 PM   [facility] [address]  [place], [place], [address] Phone: [phone]   Fax:  [phone]  Name: CARALINE DEUTSCHMAN MRN: [id number] Date of Birth: February 14, 1971

## 2019-05-29 ENCOUNTER — Ambulatory Visit: Payer: PRIVATE HEALTH INSURANCE | Admitting: Physical Therapy

## 2019-05-29 ENCOUNTER — Other Ambulatory Visit: Payer: Self-pay

## 2019-05-29 ENCOUNTER — Encounter: Payer: Self-pay | Admitting: Physical Therapy

## 2019-05-29 DIAGNOSIS — M6281 Muscle weakness (generalized): Secondary | ICD-10-CM

## 2019-05-29 DIAGNOSIS — M25522 Pain in left elbow: Secondary | ICD-10-CM

## 2019-05-29 DIAGNOSIS — M25632 Stiffness of left wrist, not elsewhere classified: Secondary | ICD-10-CM

## 2019-05-29 DIAGNOSIS — M25622 Stiffness of left elbow, not elsewhere classified: Secondary | ICD-10-CM

## 2019-05-29 NOTE — Therapy (Signed)
[facility] [address]  [place], [place], [address] Phone: [phone]   Fax:  [phone]  Physical Therapy Discharge Summary  Patient Details  Name: Ashley Franklin MRN: [id number] Date of Birth: 1971-05-10 Referring Provider (Ashley Franklin): Milly Jakob, MD   Encounter Date: 05/29/2019  Ashley Franklin End of Session - 05/29/19 1600    Visit Number  42    Number of Visits  30    Date for Ashley Franklin Re-Evaluation  05/26/19    Authorization Type  WC: 1 eval + 8 tx + 8 tx +8 tx + 8tx + 8tx + 6tx    Authorization - Visit Number  34    Authorization - Number of Visits  73    Ashley Franklin Start Time  7169    Ashley Franklin Stop Time  1551    Ashley Franklin Time Calculation (min)  30 min    Activity Tolerance  Patient tolerated treatment well    Behavior During Therapy  WFL for tasks assessed/performed       Past Medical History:  Diagnosis Date  . Anemia   . Anxiety   . Bipolar affective disorder ([place])   . Blood transfusion without reported diagnosis   . Bronchospasm with bronchitis, acute   . Depression   . Kidney stone   . Right carpal tunnel syndrome 12/09/2018  . SBO (small bowel obstruction) ([place]) 01/2014    Past Surgical History:  Procedure Laterality Date  . CESAREAN SECTION    . ENDOMETRIAL ABLATION    . GASTRIC BYPASS  2001  . SBO  2015    There were no vitals filed for this visit.  Subjective Assessment - 05/29/19 1522    Subjective  Went to see MD today- noting that she will be returning to 4 hour shifts for the weeks of the 25th, then 8 hour shifts the next week, and the 12 hour shifts the week after that. Reports that MD advised her to wrap up with Ashley Franklin today.    Pertinent History  kidney stone, depression, bipolar, anxiety, anemia    Patient Stated Goals  return to work    Currently in Pain?  No/denies         [facility] Ashley Franklin Assessment - 05/29/19 0001      Observation/Other Assessments   Focus on Therapeutic Outcomes (FOTO)   Elbow: 62 (38% limited,  38% predicted)      ROM / Strength   AROM / PROM / Strength  Strength      Strength   Left Elbow Flexion  5/5    Left Elbow Extension  5/5    Left Forearm Pronation  5/5    Left Forearm Supination  5/5    Left Wrist Flexion  4+/5    Left Wrist Extension  5/5    Left Wrist Radial Deviation  5/5    Left Wrist Ulnar Deviation  5/5                   [facility] Adult Ashley Franklin Treatment/Exercise - 05/29/19 0001      Self-Care   Self-Care  Other Self-Care Comments    Other Self-Care Comments   practice and edu on use of self-massage using ball on table to L wrist flexors and extensors      Shoulder Exercises: ROM/Strengthening   UBE (Upper Arm Bike)  L3.0 x 3 min forward/3 min backwards      Wrist Exercises   Wrist Flexion  Left;10 reps;Seated;Strengthening;Bar weights/barbell  Bar Weights/Barbell (Wrist Flexion)  --   8lbs x10 with elbow supported; 3lbs x10 with elbow extended   Wrist Extension  Left;10 reps;Seated;Bar weights/barbell;Strengthening    Bar Weights/Barbell (Wrist Extension)  --   llbs x10 with elbow supported; 3lbs x10 with elbow extended            Ashley Franklin Education - 05/29/19 1558    Education Details  review and consolidation of HEP with input from patient on movements and activities that are still limiting for her; edu on pain management techniques including ice massage, self-massage wiht tennis ball, e-stim, stretching    Person(s) Educated  Patient    Methods  Explanation;Demonstration;Tactile cues;Verbal cues;Handout    Comprehension  Verbalized understanding       Ashley Franklin Short Term Goals - 05/29/19 1603      Ashley Franklin SHORT TERM GOAL #1   Title  Independent w/ inital HEP     Time  3    Period  Weeks    Status  Achieved    Target Date  12/31/18        Ashley Franklin Long Term Goals - 05/29/19 1525      Ashley Franklin LONG TERM GOAL #1   Title  Independent w/ advanced HEP.    Time  4    Period  Weeks    Status  Achieved      Ashley Franklin LONG TERM GOAL #2   Title  Ashley Franklin. will  demonstrate an increase in Lt elbow and wrist AROM to WNL to increase function.    Time  4    Period  Weeks    Status  Achieved      Ashley Franklin LONG TERM GOAL #3   Title  Ashley Franklin. will demonstrate increase in L elbow/wrist strength to 5/5 and Lt grip strength to >40 lbs to allow for increased elbow stability and functional use of arm.    Baseline  L grip with elbow at 90 degrees: 25ppsi, L grip with elbow extended: 22.5 ppsi (average of 3 trials), MMT: grossly 4/5    Period  Weeks    Status  Partially Met   only limitation remaining in L wrist flexion strength     Ashley Franklin LONG TERM GOAL #4   Title  Patient to report 90% improvement in L elbow pain.    Baseline  Ashley Franklin reporting ability to do perform ADL's with tolerable pain depending on activity. Ashley Franklin feels she has definitely improved since beginning therapy.    Period  Weeks    Status  Partially Met   75-80% improvement in pain     Ashley Franklin LONG TERM GOAL #5   Title  Ashley Franklin will be able to perform her ususal ADL's and work duties with less than 2/10 overall pain.    Baseline  Ashley Franklin reporting that most days she is able to perform her ALD's with 2/10 pain but some days are worse.    Time  4    Period  Weeks    Status  Partially Met   3-4/10 at worse while performing tasks at home     Ashley Franklin LONG TERM GOAL #6   Title  Patient to report tolerance for pushing 70 lb sled for 5 minutes with 0/10 pain.    Status  Partially Met   able to tolerate 3 minutes with 4/10 pain on 05/27/19     Ashley Franklin LONG TERM GOAL #7   Title  Patient to carry 10lb weight in L hand for 5 min without increase in pain.  Status  Partially Met   able to tolerate 2.5 minutes with 4/10 pain on 05/27/19           Plan - 05/29/19 1600    Clinical Impression Statement  Patient reporting that she had F/U with surgeon today. Advised her that work conditioning program would not be beneficial to her and that she is now ready to be D/C'd from Ashley Franklin. Patient reporting that she will be starting short work shifts  starting 01/25, starting with 4 hours and gradually working up to full 12 hour shifts. Strength testing today revealed only limitation remaining in L wrist flexion strength. Patient still reporting 75-80% improvement in pain levels thus far. Also noting pain levels 3-4/10 at worst with ADLs. Sled pushing and farmer's carry goals still unmet, according to patient' performance on 05/27/19. Took time today to review and consolidate HEP for max benefit and continued improvement in activity tolerance at home. Also reviewed pain management techniques in case pain levels rise at work. Patient reported understanding and without complaints at end of session. Patient discharged at this time per MD's request.    Comorbidities  kidney stone, depression, bipolar, anxiety, anemia    Rehab Potential  Good    Ashley Franklin Treatment/Interventions  ADLs/Self Care Home Management;Cryotherapy;Electrical Stimulation;Moist Heat;Therapeutic exercise;Therapeutic activities;Ultrasound;Neuromuscular re-education;Patient/family education;Manual techniques;Vasopneumatic Device;Taping;Splinting;Energy conservation;Dry needling;Passive range of motion;Scar mobilization    Ashley Franklin Next Visit Plan  D/C at this time    Ashley Franklin Home Exercise Plan  putty for grip strengthening, 10-20# pail carry with towel around handle to increase grip, 7-8# wrist flexion/extension with elbow supported, 3-4# wrist flexion/extension with elbow straight and unsupported    Consulted and Agree with Plan of Care  Patient       Patient will benefit from skilled therapeutic intervention in order to improve the following deficits and impairments:  Hypomobility, Increased edema, Decreased scar mobility, Decreased activity tolerance, Increased fascial restricitons, Pain, Impaired UE functional use, Improper body mechanics, Decreased range of motion, Impaired flexibility, Postural dysfunction  Visit Diagnosis: Pain in left elbow  Stiffness of left elbow, not elsewhere  classified  Stiffness of left wrist, not elsewhere classified  Muscle weakness (generalized)     Problem List Patient Active Problem List   Diagnosis Date Noted  . Bipolar disorder, in full remission, most recent episode depressed ([place]) 04/23/2019  . Right carpal tunnel syndrome 12/09/2018  . Closed nondisplaced fracture of fifth left metatarsal bone 05/10/2018  . History of shingles 01/08/2018  . Menopause 01/08/2018  . Left ankle injury, subsequent encounter 08/04/2017  . PCP NOTES >>>>>>>>>> 07/11/2017  . 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 Chronic knee pain 04/18/2015  . Plantar fasciitis 04/18/2015  . Tobacco use disorder 03/15/2015  . Morbid obesity ([place]) 01/05/2015  . Bipolar affective ([place]) 01/02/2015      PHYSICAL THERAPY DISCHARGE SUMMARY  Visits from Start of Care: 42  Current functional level related to goals / functional outcomes: See above clinical impression   Remaining deficits: Decreased wrist flexor strength, pain, limited functional activity tolerance    Education / Equipment: HEP  Plan: Patient agrees to discharge.  Patient goals were partially met. Patient is being discharged due to the physician's request.  ?????     Ashley Franklin, Ashley Franklin, Ashley Franklin 05/29/19 4:13 PM   [facility] [address]  [place], [place], [address] Phone: [phone]   Fax:  [phone]  Name: Ashley Franklin MRN: [id number] Date of Birth: August 06, 1970

## 2019-05-30 ENCOUNTER — Other Ambulatory Visit (HOSPITAL_COMMUNITY)
Admission: RE | Admit: 2019-05-30 | Discharge: 2019-05-30 | Disposition: A | Discharge status: Home / Self Care | Payer: No Typology Code available for payment source | Source: Ambulatory Visit | Attending: Neurology | Admitting: Neurology

## 2019-05-30 DIAGNOSIS — Z01812 Encounter for preprocedural laboratory examination: Secondary | ICD-10-CM | POA: Diagnosis present

## 2019-05-30 DIAGNOSIS — Z20822 Contact with and (suspected) exposure to covid-19: Secondary | ICD-10-CM | POA: Diagnosis not present

## 2019-05-30 LAB — SARS CORONAVIRUS 2 (TAT 6-24 HRS): SARS Coronavirus 2: NEGATIVE

## 2019-06-02 ENCOUNTER — Ambulatory Visit (INDEPENDENT_AMBULATORY_CARE_PROVIDER_SITE_OTHER): Payer: No Typology Code available for payment source | Admitting: Neurology

## 2019-06-02 DIAGNOSIS — G4733 Obstructive sleep apnea (adult) (pediatric): Secondary | ICD-10-CM | POA: Diagnosis not present

## 2019-06-02 DIAGNOSIS — G4710 Hypersomnia, unspecified: Secondary | ICD-10-CM

## 2019-06-02 DIAGNOSIS — G4720 Circadian rhythm sleep disorder, unspecified type: Secondary | ICD-10-CM

## 2019-06-02 DIAGNOSIS — G4719 Other hypersomnia: Secondary | ICD-10-CM

## 2019-06-03 ENCOUNTER — Other Ambulatory Visit: Payer: Self-pay

## 2019-06-03 ENCOUNTER — Ambulatory Visit (INDEPENDENT_AMBULATORY_CARE_PROVIDER_SITE_OTHER): Payer: No Typology Code available for payment source | Admitting: Neurology

## 2019-06-03 ENCOUNTER — Other Ambulatory Visit: Payer: Self-pay | Admitting: Neurology

## 2019-06-03 DIAGNOSIS — G4710 Hypersomnia, unspecified: Secondary | ICD-10-CM

## 2019-06-03 DIAGNOSIS — Z5181 Encounter for therapeutic drug level monitoring: Secondary | ICD-10-CM

## 2019-06-03 DIAGNOSIS — G4752 REM sleep behavior disorder: Secondary | ICD-10-CM

## 2019-06-03 DIAGNOSIS — G4733 Obstructive sleep apnea (adult) (pediatric): Secondary | ICD-10-CM

## 2019-06-03 DIAGNOSIS — Z9989 Dependence on other enabling machines and devices: Secondary | ICD-10-CM

## 2019-06-03 NOTE — Addendum Note (Signed)
Addended by: Tamera Stands D on: 06/03/2019 03:51 PM   Modules accepted: Orders

## 2019-06-06 LAB — COMPREHENSIVE DRUG ANALYSIS,UR

## 2019-06-10 ENCOUNTER — Telehealth: Payer: Self-pay

## 2019-06-10 NOTE — Progress Notes (Signed)
06/10/2019 pt not available.

## 2019-06-10 NOTE — Procedures (Signed)
PATIENT'S NAME:  Ashley Franklin, Ashley Franklin DOB:      [birth year]      MR#:    [id number]     DATE OF RECORDING: 06/02/2019 REFERRING M.D.:  Kathlene November, MD Study Performed:   CPAP  study with next day MSLT HISTORY: 49 year old woman with a history of kidney stone, small bowel obstruction, depression, anxiety, anemia, and obesity, who presents for evaluation of her daytime somnolence. She has been on autoPAP. She presents for an overnight sleep study with CPAP and next day MSLT. The patient endorsed the Epworth Sleepiness Scale at 17 points. The patient's weight 258 pounds with a height of 64 (inches), resulting in a BMI of 44. kg/m2. The patient's neck circumference measured 16.5 inches.   CURRENT MEDICATIONS: Depakote, Prozac, Lamictal, Lurasidone. Patient tapered medications with guidance from her psychiatrist. She indicated, that she was off all psychotropic medications by 05/05/19. She had evidence of 3 different benzodiazepines/benzodiazepine metabolites (Desmethyldiazepam, Oxazepam and Temazepam), Norfluoxetine (which is a metabolite of fluoxetine) and diphenhydramine (ie Benadryl) in her UDS from 06/03/19.   PROCEDURE:  This is a multichannel digital polysomnogram utilizing the SomnoStar 11.2 system.  Electrodes and sensors were applied and monitored per AASM Specifications.   EEG, EOG, Chin and Limb EMG, were sampled at 200 Hz.  ECG, Snore and Nasal Pressure, Thermal Airflow, Respiratory Effort, CPAP Flow and Pressure, Oximetry was sampled at 50 Hz. Digital video and audio were recorded.      CPAP was kept a 7 cm for the night, which adequate in controlling her OSA.   Lights Out was at 21:42 and Lights On at 05:29. Total recording time (TRT) was 467.5 minutes, with a total sleep time (TST) of 419 minutes. The patient's sleep latency was 21.5 minutes, which is normal. REM latency was 93.5 minutes, which is normal. The sleep efficiency was 89.6 %.    SLEEP ARCHITECTURE: WASO (Wake after sleep onset) was 29  minutes.  There were 46 minutes in Stage N1, 129.5 minutes Stage N2, 131.5 minutes Stage N3 and 112 minutes in Stage REM.  The percentage of Stage N1 was 11.%, Stage N2 was 30.9%, which is reduced. Stage N3 was 31.4%, which is increased, and Stage R (REM sleep) was 26.7%, which is mildly increased. The arousals were noted as: 120 were spontaneous, 1 were associated with PLMs, 0 were associated with respiratory events.  RESPIRATORY ANALYSIS:  There was a total of 1 respiratory events: 0 obstructive apneas, 0 central apneas and 0 mixed apneas with a total of 0 apneas and an apnea index (AI) of 0 /hour. There were 1 hypopneas with a hypopnea index of .1/hour. The patient also had 0 respiratory event related arousals (RERAs).      The total APNEA/HYPOPNEA INDEX  (AHI) was .1 /hour and the total RESPIRATORY DISTURBANCE INDEX was .1 /hour  1 events occurred in REM sleep and 0 events in NREM. The REM AHI was .5 /hour versus a non-REM AHI of 0 /hour.  The patient spent 158.5 minutes of total sleep time in the supine position and 261 minutes in non-supine. The supine AHI was 0.4/hour, versus a non-supine AHI of 0.0/hour.  OXYGEN SATURATION & C02:  The baseline 02 saturation was 94%, with the lowest being 80%. Time spent below 89% saturation equaled 10 minutes.  PERIODIC LIMB MOVEMENTS:  The patient had a total of 7 Periodic Limb Movements. The Periodic Limb Movement (PLM) index was 1./hour and the PLM Arousal index was .1 /hour.  Audio and video analysis  did not show any abnormal or unusual movements, behaviors, phonations or vocalizations. The patient took no bathroom breaks. The EKG was in keeping with normal sinus rhythm (NSR).  Post-study, the patient indicated that sleep was worse than usual.   DIAGNOSIS 1. Obstructive Sleep Apnea  2. Dysfunctions associated with sleep stages or arousals from sleep 3. Excessive daytime sleepiness, NOS  PLANS/RECOMMENDATIONS: 1. The overnight sleep study did not  demonstrate any further obstructive or central sleep disordered breathing and good treatment on a CPAP pressure of 7 cm.  2. The NPSG shows sleep fragmentation and abnormal sleep stage percentages; these are nonspecific findings and per se do not signify an intrinsic sleep disorder or a cause for the patient's sleep-related symptoms. Causes include (but are not limited to) the first night effect of the sleep study, circadian rhythm disturbances, medication effect or an underlying mood disorder or medical problem.  3. This overnight sleep study and next day MSLT/nap study demonstrate an increased degree of sleepiness and 2 SOREMPs (sleep onset REM periods) for the nap study. While these findings can be seen in patients with narcolepsy, the patient had multiple benzodiazepine metabolites, fluoxetine metabolite and diphenhydramine (Benadryl) in her UDS. Benzodiazepines and Benadryl increase sleepiness; therefore, the study is rendered not fully valid for diagnosing an underlying organic hypersomnolence disorder. the patient will be advised of the results. She may be able to return at some point in the near future for repeat testing without presence of sedative medication.  4. The patient should be cautioned not to drive, work at heights, or operate dangerous or heavy equipment when tired or sleepy. Review and reiteration of good sleep hygiene measures should be pursued with any patient. 5. The patient will be seen in follow-up in the sleep clinic at [facility] for discussion of the test results, symptom review and further management strategies, etc. The referring provider will be notified of the test results.  I certify that I have reviewed the entire raw data recording prior to the issuance of this report in accordance with the Standards of Accreditation of the American Academy of Sleep Medicine (AASM)      Huston Foley, MD, PhD Diplomat, American Board of  Neurology and Sleep Medicine(Neurology and Sleep  Medicine)

## 2019-06-10 NOTE — Procedures (Signed)
Name:  Ashley Franklin, Ashley Franklin Reference [id number]  Study Date: 06/03/2019 Procedure #: 3860  DOB: [birth year]    HISTORY: 49 year old woman with a history of kidney stone, small bowel obstruction, depression, anxiety, anemia, and obesity, who presents for evaluation of her daytime somnolence. She has been on autoPAP. She presents for an overnight sleep study with CPAP and next day MSLT. The patient endorsed the Epworth Sleepiness Scale at 17 points. The patient's weight 258 pounds with a height of 64 (inches), resulting in a BMI of 44. kg/m2. The patient's neck circumference measured 16.5 inches.   CURRENT MEDICATIONS: Depakote, Prozac, Lamictal, Lurasidone. Patient tapered medications with guidance from her psychiatrist. She indicated, that she was off all psychotropic medications by 05/05/19. She had evidence of 3 different benzodiazepines/benzodiazepine metabolites (Desmethyldiazepam, Oxazepam and Temazepam), Norfluoxetine (which is a metabolite of fluoxetine) and diphenhydramine (ie Benadryl) in her UDS from 06/03/19.  Protocol  This is a 13 channel Multiple Sleep Latency Test comprised of 5 channels of EEG (T3-Cz, Cz-T4, F4-M1, C4-M1, O2-M1), 3 channels of Chin EMG, 4 channels of EOG and 1 channel for ECG.   All channels were sampled at 256hz .    This polysomnographic procedure is designed to evaluate (1) the complaint of excessive daytime sleepiness by quantifying the time required to fall asleep and (2) the possibility of narcolepsy by checking for abnormally short latencies to REM sleep.  Electrographic variables include EEG, EMG, EOG and ECG.  Patients are monitored throughout four or five 20-minute opportunities to sleep (naps) at two-hour intervals.  For each nap, the patient is allowed 20 minutes to fall asleep.  Once asleep, the patient is awakened after 15 minutes.  Between naps, the patient is kept as alert as possible.  A sleep latency of 20 minutes indicates that no sleep occurred.  Parametric  Analysis  Total Number of Naps 4     NAP # Time of Nap  Sleep Latency (mins) REM Latency (mins) Sleep Time Percent Awake Time Percent  1 07:00 2 0 69 31   2 08:59 1 16 91  9   3 10:57 1.5 6.5 93  7   4 12:57 2 0 91  9    MSLT Summary of Naps  Sleepiness Index: 91.9  Mean Sleep Latency to all Five Naps: 0  Mean Sleep Latency to First Four Naps: 1.6  Mean Sleep Latency to First Three Naps: 1.5  Mean Sleep Latency to First Two Naps: 1.5  Number of Naps with REM Sleep: 2    Results from Preceding PSG Study  Sleep Onset Time 22:02 Sleep Efficiency (%) 89.6%  Rise Time 05:29 Sleep Latency (min) 19.5 min  Total Sleep Time  7.0 H REM Latency (min) 93.5 min    I attest to having reviewed every epoch of the entire raw data recording prior to the issuance of this report in accordance with the Standards of the American Academy of Sleep Medicine.    Interpretation:   This nap study shows a mean sleep latency of 1.6 minutes for 4 naps and 2 SOREMPs (sleep onset REM periods) noted, namely during nap # 2 and 3. These findings indicate daytime sleepiness, but need to be interpreted in the greater context. Clinical correlation with patient's medication is recommended. The patient's medication reconciliation reveals, that she has been prescribed Valium in the past, but was tapered off of it by her psychiatrist in October 2020; see clinical note from Dr. Modesta Messing. She had evidence of 3 different benzodiazepines/benzodiazepine metabolites (Desmethyldiazepam, Oxazepam  and Temazepam), Norfluoxetine (which is a metabolite of fluoxetine) and diphenhydramine (ie Benadryl) in her UDS from 06/03/19. Clinical correlation indicates that there is evidence of daytime sleepiness, but may be heavily compounded by presence of sedative medations; therefore the MSLT is rendered not fully valid in diagnosing an underlying organic sleepiness disorder.            DIAGNOSIS 1. Obstructive Sleep Apnea   2. Dysfunctions associated with sleep stages or arousals from sleep 3. Excessive daytime sleepiness, NOS  PLANS/RECOMMENDATIONS: 1. The overnight sleep study did not demonstrate any further obstructive or central sleep disordered breathing and good treatment on a CPAP of 7 cm.  2. The NPSG shows sleep fragmentation and abnormal sleep stage percentages; these are nonspecific findings and per se do not signify an intrinsic sleep disorder or a cause for the patient's sleep-related symptoms. Causes include (but are not limited to) the first night effect of the sleep study, circadian rhythm disturbances, medication effect or an underlying mood disorder or medical problem.  3. This overnight sleep study and next day MSLT/nap study demonstrate an increased degree of sleepiness and 2 SOREMPs (sleep onset REM periods) for the nap study. While these findings can be seen in patients with narcolepsy, the patient had multiple benzodiazepine metabolites, fluoxetine metabolite and diphenhydramine (Benadryl) in her UDS. Benzodiazepines and Benadryl increase sleepiness; therefore, the study is rendered not fully valid for diagnosing an underlying organic hypersomnolence disorder. the patient will be advised of the results. She may be able to return at some point in the near future for repeat testing without presence of sedative medication.  4. The patient should be cautioned not to drive, work at heights, or operate dangerous or heavy equipment when tired or sleepy. Review and reiteration of good sleep hygiene measures should be pursued with any patient. 5. The patient will be seen in follow-up in the sleep clinic at [facility] for discussion of the test results, symptom review and further management strategies, etc. The referring provider will be notified of the test results.  I certify that I have reviewed the entire raw data recording prior to the issuance of this report in accordance with the Standards of Accreditation of  the American Academy of Sleep Medicine (AASM)  Huston Foley, MD, PhD Diplomat, American Board of Neurology and Sleep Medicine(Neurology and Sleep Medicine)

## 2019-06-10 NOTE — Telephone Encounter (Signed)
I attempted to reach the pt to discuss recent sleep study.  Pt was not available and did not have a working vm. Will try again at a later time.

## 2019-06-10 NOTE — Progress Notes (Signed)
Please call patient re: sleep study and next day nap study on 1/18 and 1/19, respectively. She was sleepy during the day and fell asleep quickly during the nap study, however, her urine drug screen from 1/05/24/19 showed evidence of 3 different benzodiazepines/benzodiazepine metabolites (Desmethyldiazepam, Oxazepam and Temazepam), Norfluoxetine (which is a metabolite of fluoxetine) and diphenhydramine (ie Benadryl) in her UDS from 06/03/19. Therefore, the studies are not considered fully valid in diagnosing an underlying sleepiness disorder. We can follow up in clinic and I would be willing to consider repeat testing in the future, if that is something she is willing and able to do.  She had no further sleep apnea events overnight and was kept on stable CPAP of 7 cm that night.

## 2019-06-10 NOTE — Telephone Encounter (Signed)
-----   Message from Huston Foley, MD sent at 06/10/2019  8:46 AM EST ----- Please call patient re: sleep study and next day nap study on 1/18 and 1/19, respectively. She was sleepy during the day and fell asleep quickly during the nap study, however, her urine drug screen from 1/05/24/19 showed evidence of 3 different benzodiazepines/benzodiazepine metabolites (Desmethyldiazepam, Oxazepam and Temazepam), Norfluoxetine (which is a metabolite of fluoxetine) and diphenhydramine (ie Benadryl) in her UDS from 06/03/19. Therefore, the studies are not considered fully valid in diagnosing an underlying sleepiness disorder. We can follow up in clinic and I would be willing to consider repeat testing in the future, if that is something she is willing and able to do.  She had no further sleep apnea events overnight and was kept on stable CPAP of 7 cm that night.

## 2019-06-11 ENCOUNTER — Telehealth: Payer: Self-pay | Admitting: Neurology

## 2019-06-11 NOTE — Telephone Encounter (Signed)
I have reached out to the Ashley Franklin and advised of Dr. Teofilo Pod recommendations.  Ashley Franklin was agreeable to the appointment and accepted 06/12/2019 at 1000 appointment.  Ashley Franklin has been advised to check in at 930 am.

## 2019-06-11 NOTE — Telephone Encounter (Signed)
As discussed in my result note, please offer patient follow-up appointment so we can discuss further, she may be able to return for another set of sleep studies but we would have to check with her insurance first, as she may not be eligible to have another set of sleep studies so soon after her last tests.  Unfortunately, as I said in my report, a reliable sleepiness diagnosis cannot be made based on the current findings.

## 2019-06-11 NOTE — Telephone Encounter (Signed)
I have sent a my chart message to the pt in regards to her sleep study.   Pt was advised to reach out if she has any questions.

## 2019-06-12 ENCOUNTER — Ambulatory Visit: Payer: No Typology Code available for payment source | Admitting: Neurology

## 2019-06-12 ENCOUNTER — Encounter: Payer: Self-pay | Admitting: Neurology

## 2019-06-12 ENCOUNTER — Encounter: Payer: Self-pay | Admitting: Internal Medicine

## 2019-06-12 ENCOUNTER — Other Ambulatory Visit: Payer: Self-pay

## 2019-06-12 VITALS — BP 135/93 | HR 73 | Temp 97.4°F | Ht 64.0 in | Wt 258.0 lb

## 2019-06-12 DIAGNOSIS — G4733 Obstructive sleep apnea (adult) (pediatric): Secondary | ICD-10-CM | POA: Diagnosis not present

## 2019-06-12 DIAGNOSIS — G4710 Hypersomnia, unspecified: Secondary | ICD-10-CM

## 2019-06-12 DIAGNOSIS — Z9989 Dependence on other enabling machines and devices: Secondary | ICD-10-CM | POA: Diagnosis not present

## 2019-06-12 DIAGNOSIS — G478 Other sleep disorders: Secondary | ICD-10-CM | POA: Diagnosis not present

## 2019-06-12 NOTE — Progress Notes (Signed)
Subjective:    Patient ID: Ashley Franklin is a 49 y.o. female.  HPI     Interim history:   Ashley Franklin is a 49 year old right-handed woman with an underlying medical history of kidney stone, small bowel obstruction, depression, anxiety, anemia, and obesity, who presents for follow-up consultation of her sleep disorder, including obstructive sleep apnea and her daytime somnolence.  The patient is unaccompanied today and presents after interim sleep study testing.  I last saw her on 04/02/2019, at which time she reported ongoing issues with significant daytime somnolence.  She was using her AutoPap.  She was advised to continue with positive airway pressure treatment.  She was advised to return for additional extended sleep study testing in the form of an overnight sleep study and next day nap study.  She was advised to talk to her psychiatrist about coming off of her sedatives and psychotropic medications.  She saw a new psychiatrist, Dr. Toy Care in the interim and was advised to stop and taper her psychotropic medications.  She indicated to the sleep lab staff that she would be off of her medication by 05/05/2019.  She was therefore scheduled for a nocturnal polysomnogram on 06/02/2019 and a next day MSLT on 06/03/2019.  I went over her test results.  Her overnight sleep study from 06/02/2019 showed no significant sleep disordered breathing, no significant PLM's.  She was kept on a CPAP pressure all night of 7 cm with adequate sleep apnea control.  Sleep efficiency was 89.6%, REM latency was normal at 93.5 minutes, sleep latency normal at 21.5 minutes.  She had 29 minutes of wake after sleep onset, she had 11% of stage I sleep which is mildly elevated, reduced percentage of stage II sleep at 30.9%, increased percentage of slow-wave sleep at 31.  4% and a slightly increased percentage of REM sleep at 26.7%.  Total AHI was 0.1/h, average oxygen saturation was 94%, nadir was briefly 80%.  She had no significant  PLM's.  She had a next day nap study which showed a mean sleep latency of 1.6 minutes for 4 naps with REM sleep noted in nap #2 and 3.  She also had an abnormal UDS on 06/03/2019 with 3 different benzodiazepines or benzodiazepine metabolites, nor fluoxetine as well as diphenhydramine noted.  Therefore, given the presence of sedative medications, her nap study in combination with her sleep study was not rendered fully valid to be able to diagnose an underlying hypersomnolence disorder.  Today, 06/12/2019: She reports that she has not taking any recent benzodiazepine and the only prescription she had was Valium, she no longer has a prescription and stopped it in November.  She also stopped her other psychotropic medications in December as she also indicated to our sleep lab staff.  She has restarted her medication the day after her sleep studies.  She has no follow-up yet with her psychiatrist but is willing to make an appointment.  She is hoping that she may be able to at least for now get a prescription for medication that may help her stay awake a little better.  She would like to go back to work.  Her primary care physician has written her out for now.  She is willing to consider retesting with a longer time off her medication.  She would even be willing to come in for a urinary test to check for her medications the week before testing so to make sure she is good to go.  Looking back, she does  endorse feeling sleepy longer than she had originally dictated.  Only in the past 18 months to 24 months did she feel that it was a significant problem and interfered with her job.  She has a history of needing a lot more sleep as a teenager.  She believes that in high school years she could sleep up to 16 hours.  It was not unusual that she would sleep into the afternoon on the weekends.   The patient's allergies, current medications, family history, past medical history, past social history, past surgical history and  problem list were reviewed and updated as appropriate.    Previously:  I first met her on 08/20/2018 at the request of her primary care physician, at which time she reported significant daytime somnolence with an Epworth sleepiness score of 17 out of 24.  Of note, she was also on several potentially sedating medications at the time including Depakote, diazepam, gabapentin, Lamictal and Ambien.  She was advised to proceed with a sleep study.  She had a home sleep test on 09/16/2018 which indicated mild sleep apnea with an AHI of 7.3/h, O2 nadir of 91%.  Given her daytime somnolence she was advised to start AutoPap therapy.     She had a subsequent follow-up visit with Ward Givens, nurse practitioner, on 12/02/2018, at which time she was compliant with her AutoPap and reported some benefit from it.   I reviewed her AutoPap compliance data from 02/24/2019 through 03/25/2019 which is a total of 30 days, during which time she used her machine 26 days with percent use days greater than 4 hours at 50% only, indicating suboptimal compliance with an average usage of 4 hours and 24 minutes, residual AHI at goal at 0.7/h, average 95th percentile of pressure at 7.8 cm with a range of 5 to 10 cm, leak on the low side with a 95th percentile at 5.4 L/min.  In the past 90 days, her percent use days greater than 4 hours was a little lower even at 41% with an average usage of 4 hours and 18 minutes.     08/20/18: I am conducting a virtual, video based new patient visit via Webex in lieu of a face-to-face visit for evaluation of her sleep disorder, in particular, evaluation for concern for obstructive sleep apnea. The patient is unaccompanied today and joins via phone from home. She is referred by her primary care physician, Dr. Kathlene November, and I reviewed his note from 07/16/2018.   Her Epworth sleepiness score is 17 out of 24, fatigue severity score is 63 out of 63. Of note, she is on several potentially sedating medications  including diazepam 15 mg 3 times a day when necessary, Depakote ER 2000 mg at bedtime, gabapentin 300 mg at bedtime, which was just ordered on 08/19/2018 by Dr. Jannifer Franklin, Lamictal 50 mg daily, let to to 40 mg daily, Ambien 5 mg when necessary at bedtime. She is followed by psychiatry. Mood wise, she is doing well, she is currently a good spot. She's trying to lose weight. She has unable to lose about 18 pounds with the help of Weight Watchers but currently her weight loss endeavors are plateaued. She works as a Charity fundraiser for [employer]. She works from 5 AM to 5:30 PM on Wednesdays, Thursdays and Fridays. On those nights he goes to bed around 7:30 or 8, on other nights may be as late as 10 or 11. Rise time for work as 4 AM, typically no later than 5  on other mornings even when she can sleep in. She has nocturia about twice per average night, has no recurrent morning headaches and denies telltale restless leg symptoms. Her husband has not only noted snoring but also pauses in her breathing for at least 3 years. She sleeps with the TV on at night, low volume, out of habit since her first husband was out of town a lot and she needed some white noise at night. She lives with her husband. She is familiar with PAP therapy as her husband has a machine, he had weight loss surgery and has lost a lot of weight. She is in the process of smoking cessation, currently smokes about 1 cigarette per day, drinks alcohol occasionally, maybe once a week, caffeine in limitation, 1 cup of coffee in the morning typically.  Her Past Medical History Is Significant For: Past Medical History:  Diagnosis Date  . Anemia   . Anxiety   . Bipolar affective disorder ([place])   . Blood transfusion without reported diagnosis   . Bronchospasm with bronchitis, acute   . Depression   . Kidney stone   . Right carpal tunnel syndrome 12/09/2018  . SBO (small bowel obstruction) ([place]) 01/2014    Her Past Surgical History Is Significant  For: Past Surgical History:  Procedure Laterality Date  . CESAREAN SECTION    . ENDOMETRIAL ABLATION    . GASTRIC BYPASS  2001  . SBO  2015    Her Family History Is Significant For: Family History  Problem Relation Age of Onset  . Diabetes Mother   . Cancer Mother 60       Throat   . Bipolar disorder Mother   . Bipolar disorder Sister   . Bipolar disorder Brother   . Drug abuse Brother   . Bipolar disorder Sister   . Colon cancer Neg Hx   . Cancer - Prostate Neg Hx   . Breast cancer Neg Hx     Her Social History Is Significant For: Social History   Socioeconomic History  . Marital status: Married    Spouse name: Not on file  . Number of children: 2  . Years of education: Not on file  . Highest education level: Not on file  Occupational History  . Occupation: phelbotomist    Employer: [employer]  Tobacco Use  . Smoking status: Current Every Day Smoker    Packs/day: 0.25    Years: 6.00    Pack years: 1.50  . Smokeless tobacco: Never Used  . Tobacco comment: 1/2 ppd   Substance and Sexual Activity  . Alcohol use: Yes    Alcohol/week: 0.0 standard drinks    Comment: Two times a week.   . Drug use: No  . Sexual activity: Yes    Partners: Male    Birth control/protection: Surgical  Other Topics Concern  . Not on file  Social History Narrative   The patient is a Charity fundraiser at [employer] hospital    1 son born 1988-08-13 daughter born in 22 she is married and lives with her husband   Prior smoker not current 3 caffeinated beverages a day no alcohol tobacco or drug use      2 independent teachers    Pharmacy schools, music teacher   Right handed    Caffeine 2 cups daily    Social Determinants of Health   Financial Resource Strain:   . Difficulty of Paying Living Expenses: Not on file  Food Insecurity:   . Worried  About Running Out of Food in the Last Year: Not on file  . Ran Out of Food in the Last Year: Not on file  Transportation Needs:   . Lack of  Transportation (Medical): Not on file  . Lack of Transportation (Non-Medical): Not on file  Physical Activity:   . Days of Exercise per Week: Not on file  . Minutes of Exercise per Session: Not on file  Stress:   . Feeling of Stress : Not on file  Social Connections:   . Frequency of Communication with Friends and Family: Not on file  . Frequency of Social Gatherings with Friends and Family: Not on file  . Attends Religious Services: Not on file  . Active Member of Clubs or Organizations: Not on file  . Attends Archivist Meetings: Not on file  . Marital Status: Not on file    Her Allergies Are:  Allergies  Allergen Reactions  . Nsaids Other (See Comments)    Contraindicated with gastric bypass  :   Her Current Medications Are:  Outpatient Encounter Medications as of 06/12/2019  Medication Sig  . divalproex (DEPAKOTE ER) 500 MG 24 hr tablet Take 4 tablets (2,000 mg total) by mouth at bedtime.  . famotidine (PEPCID) 20 MG tablet Take 1 tablet (20 mg total) by mouth 2 (two) times daily.  Marland Kitchen FLUoxetine (PROZAC) 20 MG tablet Take 4 tablets (80 mg total) by mouth daily.  Marland Kitchen lamoTRIgine (LAMICTAL) 25 MG tablet Take 2 tablets (50 mg total) by mouth daily.  . Lurasidone HCl 60 MG TABS Take 1 tablet (60 mg total) by mouth daily.  . [DISCONTINUED] ondansetron (ZOFRAN ODT) 4 MG disintegrating tablet Take 1 tablet (4 mg total) by mouth every 8 (eight) hours as needed for nausea or vomiting.   No facility-administered encounter medications on file as of 06/12/2019.  :  Review of Systems:  Out of a complete 14 point review of systems, all are reviewed and negative with the exception of these symptoms as listed below:  Review of Systems  Neurological:       Here to f/u on recent sleep study results.     Objective:  Neurological Exam  Physical Exam Physical Examination:   Vitals:   06/12/19 0957  BP: (!) 135/93  Pulse: 73  Temp: (!) 97.4 F (36.3 C)    General  Examination: The patient is a very pleasant 49 y.o. female in no acute distress. She appears well-developed and well-nourished and well groomed.   HEENT: Normocephalic, atraumatic, pupils are equal, extraocular tracking is preserved, face is symmetric with normal facial animation, speech is clear without dysarthria.  Chest: Clear to auscultation without wheezing, rhonchi or crackles noted.  Heart: S1+S2+0, regular and normal without murmurs, rubs or gallops noted.   Abdomen: Soft, non-tender and non-distended.  Extremities: There is no pitting edema in the distal lower extremities bilaterally.   Skin: Warm and dry without trophic changes.  Musculoskeletal: exam reveals no obvious joint deformities, tenderness or joint swelling or erythema.   Neurologically:  Mental status: The patient is awake, alert and oriented in all 4 spheres. Her immediate and remote memory, attention, language skills and fund of knowledge are appropriate. There is no evidence of aphasia, agnosia, apraxia or anomia. Speech is clear with normal prosody and enunciation. Thought process is linear. Mood is normal and affect is normal.  Cranial nerves II - XII are as described above under HEENT exam. In addition: shoulder shrug is normal with equal shoulder  height noted. Motor exam: Normal bulk, strength and tone is noted. There is no Evidence of tremor.  Fine motor skills are grossly intact.  Cerebellar testing shows no obvious dysmetria or intention tremor.  Sensory exam is intact to light touch.               Assessment and Plan:  Ashley Franklin is a 49 year old right-handed woman with an underlying medical history of kidney stone, small bowel obstruction, depression, anxiety, anemia, and obesity,Who presents for follow-up consultation of her Daytime somnolence.  She has a history of mild obstructive sleep apnea for which she has been on AutoPap therapy.  She reports a history of sleepiness and needing extended  sleep since teenage years.  She felt that it had started bothering her or interfered with her day-to-day life in the past year or 2.  Recent sleep study testing this month with an overnight polysomnogram and next day MSLT were not conclusive enough and there was evidence of drug metabolites from either 1 or more benzodiazepines in her system.  She may be a slow metabolizer, we extensively discussed this possibility today.  She is strongly advised to make an appointment with her psychiatrist and discuss medication management, the possibility of tapering medication again in the near future and staying off meds longer, also checking for the possibility of trying a wake promoting agent through psychiatry for now.  She understands that from the sleep study data, I have to make sure that we have a reliable data set.  She is willing to get retested with a sleep study overnight and next day MSLT and even getting her urine checked for drug metabolites a week before testing.  We will seek insurance authorization for repeat testing for that reason. She would like to be able to go back to work.  She is encouraged to talk to her primary care physician about this. She reports having significant allergy symptoms and needing to take Benadryl.  She is advised that this is the most sedating of the antihistamines typically.  She is advised to talk to her primary care physician about allergy management and other options and potentially seeing an specialist even.  I plan to see her back after her next set of sleep studies.  She is commended for working with me in that regard. Hopefully, the sleep lab can get in touch with her soon about possibility of repeat testing.  She has restarted her medications for now.  She is willing to make an appointment with her psychiatrist soon.  I answered all her questions today and she was in under the plan. I spent 30 minutes in total face-to-face time and in reviewing records during pre-charting,  more than 50% of which was spent in counseling and coordination of care, reviewing test results, reviewing medication and discussing or reviewing the diagnosis of sleepiness, the prognosis and treatment options. Pertinent laboratory and imaging test results that were available during this visit with the patient were reviewed by me and considered in my medical decision making (see chart for details).

## 2019-06-12 NOTE — Patient Instructions (Addendum)
As discussed, based on the current data, I could not make a reliable sleepiness diagnosis.  Please make an appointment with your psychiatrist discussing the possibility of changing her medication regimen, adding perhaps a medication that would allow you to be more alert during the day.  Also, you may benefit from getting checked for your medication metabolism, there are some ways to check if some people are very slow metabolizers.  Please try to stay active physically, there is currently no reason for me to suggest that you should not work.  Please talk to Dr. Drue Novel about the possibility of going back to work.  I would be happy to recheck your sleep studies and order another set of sleep testing. The possibility that you truly have narcolepsy is not completely off the table.  As discussed, we will try to get you in for a urinary drug screen the week before testing, so to make sure that you are medications are truly out of your system, and the day of your nap testing as well, which is standard, as you know.

## 2019-06-13 ENCOUNTER — Telehealth: Payer: Self-pay | Admitting: Internal Medicine

## 2019-06-13 NOTE — Telephone Encounter (Signed)
Work note provided to Pt through [employer] through the end of February 2021. Dismissal letter printed, awaiting MD signature when he is back in the office on Monday.

## 2019-06-13 NOTE — Telephone Encounter (Signed)
Although the patient dismissed me as her primary doctor (see message from 05/14/2019) I still getting messages from her. This time she is asking for extension of her work excuse. Plan: 1.  Write a work excuse effective until  the end of February 2.  Send her a formal dismissal letter from my practice

## 2019-06-16 ENCOUNTER — Telehealth: Payer: Self-pay

## 2019-06-16 NOTE — Telephone Encounter (Signed)
Ashley Franklin, could you check into whether an appeal letter could be written or a peer to peer could be set up?

## 2019-06-16 NOTE — Telephone Encounter (Signed)
Dismissal signed and given to [employer], Research officer, political party.

## 2019-06-16 NOTE — Telephone Encounter (Signed)
Left message for patient to let her know [employer] denied repeat sleep studies

## 2019-06-16 NOTE — Telephone Encounter (Signed)
Called patients insurance ([employer]) do see if I could submit an authorization for repeat sleep studies. Rep said one cannot be submitted since previous authorization studies have been done. Reference call number is [phone]

## 2019-06-19 ENCOUNTER — Telehealth: Payer: Self-pay

## 2019-06-19 NOTE — Telephone Encounter (Signed)
My chart message has been sent to the pt regarding this message.

## 2019-06-19 NOTE — Telephone Encounter (Signed)
Spoke with insurance company about a peer to peer review to repeat MSLT/PSG studies. Due to both studies being valid these charges cannot be overturned and insurance will not pay for another study. A peer to peer will not help in this situation.

## 2019-06-19 NOTE — Telephone Encounter (Signed)
  Megan, please call patient back regarding sleep study testing.  Her insurance denied repeat testing.  This was expected as I had explained to her, and an appeal is not possible. At this juncture, unfortunately, I have exhausted all my options.  I would recommend that she meet with her primary care physician for the possibility of seeking care with another sleep specialist, maybe there is a chance that she can have repeat testing through another practice. I would also encourage her to keep close follow-up with her psychiatrist.

## 2019-06-19 NOTE — Telephone Encounter (Signed)
lvm asking for pt to call me back.

## 2019-07-02 NOTE — Progress Notes (Deleted)
BH MD/PA/NP OP Progress Note  07/02/2019 1:14 PM Ashley Franklin  MRN:  [id number]  Chief Complaint:  HPI: *** Visit Diagnosis: No diagnosis found.  Past Psychiatric History: Please see initial evaluation for full details. I have reviewed the history. No updates at this time.     Past Medical History:  Past Medical History:  Diagnosis Date  . Anemia   . Anxiety   . Bipolar affective disorder ([place])   . Blood transfusion without reported diagnosis   . Bronchospasm with bronchitis, acute   . Depression   . Kidney stone   . Right carpal tunnel syndrome 12/09/2018  . SBO (small bowel obstruction) ([place]) 01/2014    Past Surgical History:  Procedure Laterality Date  . CESAREAN SECTION    . ENDOMETRIAL ABLATION    . GASTRIC BYPASS  2001  . SBO  2015    Family Psychiatric History: ***  Family History:  Family History  Problem Relation Age of Onset  . Diabetes Mother   . Cancer Mother 60       Throat   . Bipolar disorder Mother   . Bipolar disorder Sister   . Bipolar disorder Brother   . Drug abuse Brother   . Bipolar disorder Sister   . Colon cancer Neg Hx   . Cancer - Prostate Neg Hx   . Breast cancer Neg Hx     Social History:  Social History   Socioeconomic History  . Marital status: Married    Spouse name: Not on file  . Number of children: 2  . Years of education: Not on file  . Highest education level: Not on file  Occupational History  . Occupation: phelbotomist    Employer: [employer]  Tobacco Use  . Smoking status: Current Every Day Smoker    Packs/day: 0.25    Years: 6.00    Pack years: 1.50  . Smokeless tobacco: Never Used  . Tobacco comment: 1/2 ppd   Substance and Sexual Activity  . Alcohol use: Yes    Alcohol/week: 0.0 standard drinks    Comment: Two times a week.   . Drug use: No  . Sexual activity: Yes    Partners: Male    Birth control/protection: Surgical  Other Topics Concern  . 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 Difficulty of Paying Living Expenses: Not on file  Food Insecurity:   . Worried About Charity fundraiser in the Last Year: Not on file  . Ran Out of Food in the Last Year: Not on file  Transportation Needs:   . Lack of Transportation (Medical): Not on file  . Lack of Transportation (Non-Medical): Not on file  Physical Activity:   . Days of Exercise per Week: Not on file  . Minutes of Exercise per Session: Not on file  Stress:   . Feeling of Stress : Not on file  Social Connections:   . Frequency of Communication with Friends and Family: Not on file  .  Frequency of Social Gatherings with Friends and Family: Not on file  . Attends Religious Services: Not on file  . Active Member of Clubs or Organizations: Not on file  . Attends Banker Meetings: Not on file  . Marital Status: Not on file    Allergies:  Allergies  Allergen Reactions  . Nsaids Other (See Comments)    Contraindicated with gastric bypass    Metabolic Disorder Labs: Lab Results  Component Value Date   HGBA1C 4.9 02/06/2017   MPG 105 02/26/2015   No results found for: PROLACTIN Lab Results  Component Value Date   CHOL 165 02/06/2017   TRIG 87 02/06/2017   HDL 54 02/06/2017   LDLCALC 94 02/06/2017   Lab Results  Component Value Date   TSH 1.12 07/10/2017   TSH 1.22 06/08/2016    Therapeutic Level Labs: No results found for: LITHIUM Lab Results  Component Value Date   VALPROATE 71 01/27/2019   VALPROATE 64 10/02/2018   No components found for:  CBMZ  Current Medications: Current Outpatient Medications   Medication Sig Dispense Refill  . divalproex (DEPAKOTE ER) 500 MG 24 hr tablet Take 4 tablets (2,000 mg total) by mouth at bedtime. 360 tablet 1  . famotidine (PEPCID) 20 MG tablet Take 1 tablet (20 mg total) by mouth 2 (two) times daily. 60 tablet 0-  . FLUoxetine (PROZAC) 20 MG tablet Take 4 tablets (80 mg total) by mouth daily. 360 tablet 1  . lamoTRIgine (LAMICTAL) 25 MG tablet Take 2 tablets (50 mg total) by mouth daily. 180 tablet 1  . Lurasidone HCl 60 MG TABS Take 1 tablet (60 mg total) by mouth daily. 90 tablet 1   No current facility-administered medications for this visit.     Musculoskeletal: Strength & Muscle Tone: {desc; muscle tone:32375} Gait & Station: {PE GAIT ED WUJW:11914} Patient leans: {Patient Leans:21022755}  Psychiatric Specialty Exam: Review of Systems  There were no vitals taken for this visit.There is no height or weight on file to calculate BMI.  General Appearance: {Appearance:22683}  Eye Contact:  {BHH EYE CONTACT:22684}  Speech:  {Speech:22685}  Volume:  {Volume (PAA):22686}  Mood:  {BHH MOOD:22306}  Affect:  {Affect (PAA):22687}  Thought Process:  {Thought Process (PAA):22688}  Orientation:  {BHH ORIENTATION (PAA):22689}  Thought Content: {Thought Content:22690}   Suicidal Thoughts:  {ST/HT (PAA):22692}  Homicidal Thoughts:  {ST/HT (PAA):22692}  Memory:  {BHH MEMORY:22881}  Judgement:  {Judgement (PAA):22694}  Insight:  {Insight (PAA):22695}  Psychomotor Activity:  {Psychomotor (PAA):22696}  Concentration:  {Concentration:21399}  Recall:  {BHH GOOD/FAIR/POOR:22877}  Fund of Knowledge: {BHH GOOD/FAIR/POOR:22877}  Language: {BHH GOOD/FAIR/POOR:22877}  Akathisia:  {BHH YES OR NO:22294}  Handed:  {Handed:22697}  AIMS (if indicated): {Desc; done/not:10129}  Assets:  {Assets (PAA):22698}  ADL's:  {BHH NWG'N:56213}  Cognition: {chl bhh cognition:304700322}  Sleep:  {BHH GOOD/FAIR/POOR:22877}   Screenings: PHQ2-9     Nutrition from 03/06/2017  in [facility] Office Visit from 06/08/2016 in [facility] Office Visit from 03/15/2015 in [facility] Office Visit from 01/12/2015 in [facility]  PHQ-2 Total Score  0  0  0  0  PHQ-9 Total Score  -  2  -  -       Assessment and Plan: ***   [name], MD 07/02/2019, 1:14 PM

## 2019-07-04 NOTE — Progress Notes (Signed)
Virtual Visit via Video Note  I connected with Ashley Franklin on 07/07/19 at  3:40 PM EST by a video enabled telemedicine application and verified that I am speaking with the correct person using two identifiers.   I discussed the limitations of evaluation and management by telemedicine and the availability of in person appointments. The patient expressed understanding and agreed to proceed.  I discussed the assessment and treatment plan with the patient. The patient was provided an opportunity to ask questions and all were answered. The patient agreed with the plan and demonstrated an understanding of the instructions.   The patient was advised to call back or seek an in-person evaluation if the symptoms worsen or if the condition fails to improve as anticipated.  I provided 25 minutes of non-face-to-face time during this encounter.   [name], MD    [facility] MD/PA/NP OP Progress Note  07/07/2019 4:21 PM Ashley Franklin  MRN:  [id number]  Chief Complaint:  Chief Complaint    Other; Follow-up     HPI:  - Since the last visit, Psychotropics were discontinued by Dr. Evelene Croon for anticipated sleep study. Per chart review, neurology is considering another sleep study. The result of the recent overnight sleep study/next day MSLT/nap study is not fully valid  as there was evidence of drug metabolites from one or more benzodiazepines in her system.   This is a follow-up appointment for bipolar disorder.  She states that she has not been doing well.  She has been out of payment for the past 4 months.  She is very concerned about financial strain.  She states that she received a letter stating that she was dismissed from [employer]. She reports her concern as Dr. Drue Novel was the one to excuse her from work, and she would not be able to return to work unless she receives a letter from him. She has not heard back from Dr. Teofilo Pod office as to where she can see another specialist. She agrees to reach out to  the office to get their recommendation for sleep specialist so that she can safely return to work.  She states that she was feeling down, mean and irritable when she discontinued all of her medication on 12/28 for sleep study. Although she reinitiated all of her medication after the sleep study, she continues to feel anxious and feels down due to her current situation. She has SI, thinking that it would be financially better for others if she were not there, although she denies any intent or plan, stating that she has her husband, father and her children. She agrees to safely lock her guns at home. She has hypersomnia. She has fair concentration. She feels anxious, tense, and had irrational thoughts of being feared that she is going to see a dead body or car crash. She denies decreased needed for sleep or euphoria.    Visit Diagnosis:    ICD-10-CM   1. Bipolar affective disorder, currently depressed, mild (HCC)  F31.31 FLUoxetine (PROZAC) 20 MG tablet    Past Psychiatric History: Please see initial evaluation for full details. I have reviewed the history. No updates at this time.     Past Medical History:  Past Medical History:  Diagnosis Date  . Anemia   . Anxiety   . Bipolar affective disorder (HCC)   . Blood transfusion without reported diagnosis   . Bronchospasm with bronchitis, acute   . Depression   . Kidney stone   . Right carpal tunnel syndrome  12/09/2018  . SBO (small bowel obstruction) ([place]) 01/2014    Past Surgical History:  Procedure Laterality Date  . CESAREAN SECTION    . ENDOMETRIAL ABLATION    . GASTRIC BYPASS  2001  . SBO  2015    Family Psychiatric History: Please see initial evaluation for full details. I have reviewed the history. No updates at this time.     Family History:  Family History  Problem Relation Age of Onset  . Diabetes Mother   . Cancer Mother 60       Throat   . Bipolar disorder Mother   . Bipolar disorder Sister   . Bipolar disorder  Brother   . Drug abuse Brother   . Bipolar disorder Sister   . Colon cancer Neg Hx   . Cancer - Prostate Neg Hx   . Breast cancer Neg Hx     Social History:  Social History   Socioeconomic History  . Marital status: Married    Spouse name: Not on file  . Number of children: 2  . Years of education: Not on file  . Highest education level: Not on file  Occupational History  . Occupation: phelbotomist    Employer: [employer]  Tobacco Use  . Smoking status: Current Every Day Smoker    Packs/day: 0.25    Years: 6.00    Pack years: 1.50  . Smokeless tobacco: Never Used  . Tobacco comment: 1/2 ppd   Substance and Sexual Activity  . Alcohol use: Yes    Alcohol/week: 0.0 standard drinks    Comment: Two times a week.   . Drug use: No  . Sexual activity: Yes    Partners: Male    Birth control/protection: Surgical  Other Topics Concern  . 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 Difficulty of Paying Living Expenses: Not on file  Food Insecurity:   . Worried About Charity fundraiser in the Last Year: Not on file  . Ran Out of Food in the Last Year: Not on file  Transportation Needs:   . Lack of Transportation (Medical): Not on file  . Lack of Transportation (Non-Medical): Not on file  Physical Activity:   . Days of Exercise per Week: Not on file  . Minutes of Exercise per Session: Not on file  Stress:   . Feeling of Stress : Not on file  Social Connections:   . Frequency of Communication with Friends and Family: Not on file  . Frequency of Social Gatherings with Friends and  Family: Not on file  . Attends Religious Services: Not on file  . Active Member of Clubs or Organizations: Not on file  . Attends Archivist Meetings: Not on file  . Marital Status: Not on file    Allergies:  Allergies  Allergen Reactions  . Nsaids Other (See Comments)    Contraindicated with gastric bypass    Metabolic Disorder Labs: Lab Results  Component Value Date   HGBA1C 4.9  02/06/2017   MPG 105 02/26/2015   No results found for: PROLACTIN Lab Results  Component Value Date   CHOL 165 02/06/2017   TRIG 87 02/06/2017   HDL 54 02/06/2017   LDLCALC 94 02/06/2017   Lab Results  Component Value Date   TSH 1.12 07/10/2017   TSH 1.22 06/08/2016    Therapeutic Level Labs: No results found for: LITHIUM Lab Results  Component Value Date   VALPROATE 71 01/27/2019   VALPROATE 64 10/02/2018   No components found for:  CBMZ  Current Medications: Current Outpatient Medications  Medication Sig Dispense Refill  . divalproex (DEPAKOTE ER) 500 MG 24 hr tablet Take 4 tablets (2,000 mg total) by mouth at bedtime. 360 tablet 0  . famotidine (PEPCID) 20 MG tablet Take 1 tablet (20 mg total) by mouth 2 (two) times daily. 60 tablet 0-  . FLUoxetine (PROZAC) 20 MG tablet Take 4 tablets (80 mg total) by mouth daily. 360 tablet 0  . lamoTRIgine (LAMICTAL) 100 MG tablet Take 1 tablet (100 mg total) by mouth daily. 90 tablet 0  . Lurasidone HCl 60 MG TABS Take 1 tablet (60 mg total) by mouth daily. 90 tablet 0   No current facility-administered medications for this visit.     Musculoskeletal: Strength & Muscle Tone: N/A Gait & Station: N/A Patient leans: N/A  Psychiatric Specialty Exam: Review of Systems  Psychiatric/Behavioral: Positive for dysphoric mood, sleep disturbance and suicidal ideas. Negative for agitation, behavioral problems, confusion, decreased concentration, hallucinations and self-injury. The patient is nervous/anxious. The patient is not hyperactive.    All other systems reviewed and are negative.   There were no vitals taken for this visit.There is no height or weight on file to calculate BMI.  General Appearance: Fairly Groomed  Eye Contact:  Good  Speech:  Clear and Coherent  Volume:  Normal  Mood:  Anxious  Affect:  Appropriate, Congruent and slightly restricted  Thought Process:  Coherent  Orientation:  Full (Time, Place, and Person)  Thought Content: Logical   Suicidal Thoughts:  Yes.  without intent/plan  Homicidal Thoughts:  No  Memory:  Immediate;   Good  Judgement:  Good  Insight:  Fair  Psychomotor Activity:  Normal  Concentration:  Concentration: Good and Attention Span: Good  Recall:  Good  Fund of Knowledge: Good  Language: Good  Akathisia:  No  Handed:  Right  AIMS (if indicated): not done  Assets:  Communication Skills Desire for Improvement  ADL's:  Intact  Cognition: WNL  Sleep:  hypersomnia   Screenings: PHQ2-9     Nutrition from 03/06/2017 in [facility] Office Visit from 06/08/2016 in [facility] Office Visit from 03/15/2015 in [facility] Office Visit from 01/12/2015 in [facility]  PHQ-2 Total Score  0  0  0  0  PHQ-9 Total Score  --  2  --  --       Assessment and Plan:  Ashley Franklin is a 48 y.o. year old female with a history of bipolar disorder, anxiety,anemia, obesity s/p gastric bypass surgery , who presents for follow up appointment for Bipolar affective disorder, currently depressed, mild (HCC) - Plan: FLUoxetine (PROZAC) 20 MG tablet  # Bipolar disorder, most recent episode depressed # Unspecified anxiety disorder She reports worsening in depressive symptoms and anxiety in the context of financial strain, and being out of work secondary to physical condition of  hypersomnia.  Will uptitrate lamotrigine to target bipolar disorder and for depressed mood.   Discussed potential risk of Stevens-Johnson syndrome.  Will continue Depakote for mood dysregulation.  Will continue fluoxetine for depression.  We will continue lamotrigine for mood dysregulation.  Patient agrees not to restart benzodiazepine which can worsen insomnia/can have impact on potential upcoming sleep study.   # r/o narcolepsy She is advised to contact Dr. Teofilo Pod office to seek other sleep specialist who may be able to do sleep study for further evaluation.   Plan: I have reviewed and updated plans as below 1.ContinueDepakote 2000 mg at night  - plan to obtain Depakote level at the next visit 2. Continue fluoxetine 80 mg daily  3.Increaselamotrigine100mg  daily 4.Continuelatuda 60 mg daily 5. Next appointment: 3/29 at 1 PM for 30 mins, video  6. Emergency resources which includes 911, ED, suicide crisis line [phone]) are discussed. She agrees to lock her guns where she does not have access.  Past trials of medication: Lexapro, Paxil, amitriptyline, Abilify (irritable), oxcarbazepine, perphenazine, Trifluoperazine, Klonopin, Valium. Vistaril, Ambien  I have reviewed suicide assessment in detail. Updated as below. The patient demonstrates the following risk factors for suicide: Chronic risk factorsfor suicide include psychiatric disorder /bipolar disorder,previous self-harm of scratching herself. Acute risk factorsfor suicide includenone. Protective factorsfor this patient include positive social support, positive therapeutic relationship,hope for the future. Considering these factors, the overall suicide risk at this point appears to be low. Patient does have gun access at home and agrees to lock guns. Discussed in detail safety plan that anytime having active suicidal thoughts or homicidal thoughts and she need to call 911 or go to local emergency room.  [name], MD 07/07/2019, 4:21 PM

## 2019-07-07 ENCOUNTER — Encounter: Payer: Self-pay | Admitting: Neurology

## 2019-07-07 ENCOUNTER — Ambulatory Visit (HOSPITAL_COMMUNITY): Payer: No Typology Code available for payment source | Admitting: Psychiatry

## 2019-07-07 ENCOUNTER — Other Ambulatory Visit: Payer: Self-pay

## 2019-07-07 ENCOUNTER — Encounter: Payer: Self-pay | Admitting: Internal Medicine

## 2019-07-07 ENCOUNTER — Encounter (HOSPITAL_COMMUNITY): Payer: Self-pay | Admitting: Psychiatry

## 2019-07-07 ENCOUNTER — Ambulatory Visit (INDEPENDENT_AMBULATORY_CARE_PROVIDER_SITE_OTHER): Payer: No Typology Code available for payment source | Admitting: Psychiatry

## 2019-07-07 DIAGNOSIS — F3131 Bipolar disorder, current episode depressed, mild: Secondary | ICD-10-CM

## 2019-07-07 MED ORDER — LAMOTRIGINE 100 MG PO TABS: 100.0000 mg | ORAL_TABLET | Freq: Every day | ORAL | 0 refills | Status: DC

## 2019-07-07 MED ORDER — FLUOXETINE HCL 20 MG PO TABS: 80.0000 mg | ORAL_TABLET | Freq: Every day | ORAL | 0 refills | Status: DC

## 2019-07-07 MED ORDER — DIVALPROEX SODIUM ER 500 MG PO TB24: 2000.0000 mg | ORAL_TABLET | Freq: Every day | ORAL | 0 refills | Status: DC

## 2019-07-07 MED ORDER — LURASIDONE HCL 60 MG PO TABS: 60.0000 mg | ORAL_TABLET | Freq: Every day | ORAL | 0 refills | Status: DC

## 2019-07-07 MED FILL — LAMOTRIGINE 100 MG TABS: 100 | 90 days supply | Qty: 90 | Fill #0

## 2019-07-07 MED FILL — DIVALPROEX SOD ER 500 MG TA: 500 | 90 days supply | Qty: 360 | Fill #0

## 2019-07-07 MED FILL — LATUDA 60 MG TABLET: 60 | 90 days supply | Qty: 90 | Fill #0

## 2019-07-07 MED FILL — FLUOXETINE HCL 20 MG TABS: 20 | 90 days supply | Qty: 360 | Fill #0

## 2019-07-07 NOTE — Patient Instructions (Addendum)
1.ContinueDepakote 2000 mg at night  2. Continue fluoxetine 80 mg daily  3.Increaselamotrigine100mg  daily 4.Continuelatuda 60 mg daily 5. Next appointment: 3/29 at 1 PM  6. Please discuss with your husband to lock your guns where you do not have access 7. CONTACT INFORMATION  What to do if you need to get in touch with someone regarding a psychiatric issue:  1. EMERGENCY: For psychiatric emergencies (if you are suicidal or if there are any other safety issues) call 911 and/or go to your nearest Emergency Room immediately.   2. IF YOU NEED SOMEONE TO TALK TO RIGHT NOW: Given my clinical responsibilities, I may not be able to speak with you over the phone for a prolonged period of time.  a. You may always call The National Suicide Prevention Lifeline at 1-800-273-TALK [phone]).

## 2019-07-09 MED FILL — MELOXICAM 7.5 MG TABLET: 7.5 | 30 days supply | Qty: 60 | Fill #0

## 2019-07-13 ENCOUNTER — Encounter: Payer: Self-pay | Admitting: Neurology

## 2019-07-13 ENCOUNTER — Encounter: Payer: Self-pay | Admitting: Internal Medicine

## 2019-07-14 ENCOUNTER — Ambulatory Visit: Payer: No Typology Code available for payment source | Admitting: Adult Health

## 2019-07-16 ENCOUNTER — Telehealth: Payer: Self-pay | Admitting: Neurology

## 2019-07-16 NOTE — Telephone Encounter (Signed)
Mychart message has been sent.  

## 2019-07-16 NOTE — Telephone Encounter (Signed)
Please advise patient that release to go back to work should come with whoever took her out of work. Moving forward, please advise patient to establish care with a new primary care provider as well as seek consultation with a sleep specialist, as we discussed during our last appointment as well as in our Recent MyChart messages.

## 2019-07-17 ENCOUNTER — Telehealth: Payer: Self-pay | Admitting: Internal Medicine

## 2019-07-17 NOTE — Telephone Encounter (Signed)
Patient called stated she was told only by her new PCP's RN that only our Print production planner  Marchelle Folks could help her. There also a previous message routed to you on 07/14/19 by [facility].  Pt recently dismissed from [employer]. She was taken out of work until Feb 28th. She now needs a Release to work letter so she can return to work. Her New PCP has referred her back to our office since for that letter since Drue Novel is the PCP that took her out. Patient is a Producer, television/film/video and this letter is being requested by her Production designer, theatre/television/film. Please advise.

## 2019-07-18 NOTE — Telephone Encounter (Signed)
Sent additional MyChart message reiterating to pt that his office will not provide a return to work note.

## 2019-07-25 ENCOUNTER — Other Ambulatory Visit: Payer: Self-pay | Admitting: Orthopedic Surgery

## 2019-07-29 ENCOUNTER — Encounter (HOSPITAL_BASED_OUTPATIENT_CLINIC_OR_DEPARTMENT_OTHER): Payer: Self-pay | Admitting: Orthopedic Surgery

## 2019-07-29 ENCOUNTER — Other Ambulatory Visit: Payer: Self-pay

## 2019-07-30 ENCOUNTER — Ambulatory Visit: Payer: No Typology Code available for payment source | Admitting: Medical

## 2019-07-31 ENCOUNTER — Other Ambulatory Visit: Payer: Self-pay

## 2019-07-31 ENCOUNTER — Encounter: Payer: Self-pay | Admitting: Family Medicine

## 2019-07-31 ENCOUNTER — Ambulatory Visit (INDEPENDENT_AMBULATORY_CARE_PROVIDER_SITE_OTHER): Payer: No Typology Code available for payment source | Admitting: Family Medicine

## 2019-07-31 VITALS — BP 120/80 | HR 85 | Temp 98.2°F | Ht 64.0 in | Wt 253.6 lb

## 2019-07-31 DIAGNOSIS — G4710 Hypersomnia, unspecified: Secondary | ICD-10-CM

## 2019-07-31 DIAGNOSIS — Z0189 Encounter for other specified special examinations: Secondary | ICD-10-CM

## 2019-07-31 NOTE — Patient Instructions (Signed)
I have referred you to [facility] for further evaluation. You should receive a call from them.

## 2019-07-31 NOTE — Progress Notes (Signed)
Subjective:    Patient ID: Ashley Franklin, female    DOB: 11/26/1970, 49 y.o.   MRN: [id number]  HPI Chief Complaint  Patient presents with  . new pt    new pt get established- needs a release to go back to work. dx with hypersomnia- been out of work since october, wanting to go back. would like to try the medicine sunosi  as she has sleep apnea too. been to neuro and was told it wasn't narcelospy   She is new to the practice and here to establish care.  Previous medical care: Dr. Larose Kells   Other providers: Neurologist- Dr. Rexene Alberts (recommended her to see a new sleep specialist)  Psychiatrist- Dr. Dannial Monarch specialist- Dr. Fredna Dow OB/GYN- at [facility] her concern today is that she needs a return to work note in order to keep her job.  States she has been out of work since approximately October 2020.  States her PCP took her out of work due to hyper somnolence and referred her to a neurologist.  States the neurologist told her that she would need her PCP to give her a return to work note since he took her out of work initially.  States her PCP said that her neurologist would need to give her a return to work note since he did not feel comfortable doing this.  She also reports that her PCP dismissed her from the practice and she can no longer talk to them.  Reports having OSA and uses a CPAP.  States she had a sleep study which Dr. Rexene Alberts ordered due to hypersomnolence.  States she was told that she had benzodiazepines in her system which made the sleep study inaccurate and that she would need a new sleep study.  States she just needs a note to return to work so that she does not lose her job.  States she is also under the care of her psychiatrist.  Apparently she is having wrist surgery later this month by Dr. Fredna Dow.  I asked her if she would have to miss work due to this and she states "I do not know"  Works in phlebotomy for [employer]    Reviewed allergies, medications, past  medical, surgical, family, and social history.    Review of Systems Pertinent positives and negatives in the history of present illness.     Objective:   Physical Exam BP 120/80   Pulse 85   Temp 98.2 F (36.8 C)   Ht '5\' 4"'$  (1.626 m)   Wt 253 lb 9.6 oz (115 kg)   BMI 43.53 kg/m   Alert and oriented and in no acute distress.  Not otherwise examined.      Assessment & Plan:  Hypersomnia - Plan: Ambulatory referral to Neurology  Needs sleep apnea assessment - Plan: Ambulatory referral to Neurology  Morbid obesity [facility]) - Plan: Ambulatory referral to Neurology  She is a 49 year old female who is new to the practice and here to establish care.  She was put on the schedule shortly before her appointment time so after a brief introduction, I stepped out of the room to review her chart. Per EMR, Dr. Larose Kells has been extending her out of work note per patient's request and the last out of work extension was through July 13 2019. Also, Dr. Rexene Alberts, neurology, advised her to find a new sleep specialist and that she could no longer help her. The patient was not forthcoming with this information and  I did let her know that I reviewed her chart and discovered these details.  Discussed that it is my understanding that she is supposed to establish with a new sleep specialist to have another sleep study.  Discussed that having just met her today, I cannot provide her with a return to work.  She verbalized understanding of this and then asked if I could give her an out of work extension.  I also discussed that it would not be appropriate for me to write her out of work.  I shared with her that I appreciate that she is in a tough spot however, I have not been involved in her care and it would not be appropriate for me to get involved except to help her find a new sleep specialist to work up her issue of hypersomnolence.

## 2019-08-01 ENCOUNTER — Other Ambulatory Visit: Payer: Self-pay | Admitting: Internal Medicine

## 2019-08-01 ENCOUNTER — Other Ambulatory Visit (HOSPITAL_COMMUNITY)
Admission: RE | Admit: 2019-08-01 | Discharge: 2019-08-01 | Disposition: A | Discharge status: Home / Self Care | Payer: No Typology Code available for payment source | Source: Ambulatory Visit | Attending: Orthopedic Surgery | Admitting: Orthopedic Surgery

## 2019-08-01 DIAGNOSIS — Z20822 Contact with and (suspected) exposure to covid-19: Secondary | ICD-10-CM | POA: Diagnosis not present

## 2019-08-01 DIAGNOSIS — Z01812 Encounter for preprocedural laboratory examination: Secondary | ICD-10-CM | POA: Diagnosis present

## 2019-08-01 DIAGNOSIS — Z0189 Encounter for other specified special examinations: Secondary | ICD-10-CM

## 2019-08-01 DIAGNOSIS — G4710 Hypersomnia, unspecified: Secondary | ICD-10-CM

## 2019-08-01 LAB — SARS CORONAVIRUS 2 (TAT 6-24 HRS): SARS Coronavirus 2: NEGATIVE

## 2019-08-01 NOTE — Progress Notes (Signed)

## 2019-08-05 ENCOUNTER — Ambulatory Visit (HOSPITAL_BASED_OUTPATIENT_CLINIC_OR_DEPARTMENT_OTHER): Payer: No Typology Code available for payment source | Admitting: Anesthesiology

## 2019-08-05 ENCOUNTER — Encounter (HOSPITAL_BASED_OUTPATIENT_CLINIC_OR_DEPARTMENT_OTHER): Payer: Self-pay | Admitting: Orthopedic Surgery

## 2019-08-05 ENCOUNTER — Ambulatory Visit (HOSPITAL_BASED_OUTPATIENT_CLINIC_OR_DEPARTMENT_OTHER)
Admission: RE | Admit: 2019-08-05 | Discharge: 2019-08-05 | Disposition: A | Discharge status: Home / Self Care | Payer: No Typology Code available for payment source | Attending: Orthopedic Surgery | Admitting: Orthopedic Surgery

## 2019-08-05 ENCOUNTER — Encounter (HOSPITAL_BASED_OUTPATIENT_CLINIC_OR_DEPARTMENT_OTHER)
Admission: RE | Disposition: A | Discharge status: Home / Self Care | Payer: Self-pay | Source: Home / Self Care | Attending: Orthopedic Surgery

## 2019-08-05 ENCOUNTER — Other Ambulatory Visit: Payer: Self-pay

## 2019-08-05 DIAGNOSIS — Z6841 Body Mass Index (BMI) 40.0 and over, adult: Secondary | ICD-10-CM | POA: Diagnosis not present

## 2019-08-05 DIAGNOSIS — G5601 Carpal tunnel syndrome, right upper limb: Secondary | ICD-10-CM | POA: Insufficient documentation

## 2019-08-05 DIAGNOSIS — F319 Bipolar disorder, unspecified: Secondary | ICD-10-CM | POA: Diagnosis not present

## 2019-08-05 DIAGNOSIS — G4730 Sleep apnea, unspecified: Secondary | ICD-10-CM | POA: Insufficient documentation

## 2019-08-05 DIAGNOSIS — Z9884 Bariatric surgery status: Secondary | ICD-10-CM | POA: Diagnosis not present

## 2019-08-05 DIAGNOSIS — Z886 Allergy status to analgesic agent status: Secondary | ICD-10-CM | POA: Insufficient documentation

## 2019-08-05 HISTORY — DX: Sleep apnea, unspecified: G47.30

## 2019-08-05 HISTORY — PX: CARPAL TUNNEL RELEASE: SHX101

## 2019-08-05 LAB — POCT PREGNANCY, URINE: Preg Test, Ur: NEGATIVE

## 2019-08-05 SURGERY — CARPAL TUNNEL RELEASE
Anesthesia: Regional | Site: Arm Lower | Laterality: Right

## 2019-08-05 MED ORDER — HYDROMORPHONE HCL 1 MG/ML IJ SOLN: 0.2500 mg | INTRAMUSCULAR | Status: DC | PRN

## 2019-08-05 MED ORDER — OXYCODONE HCL 5 MG/5ML PO SOLN: 5.0000 mg | Freq: Once | ORAL | Status: DC | PRN

## 2019-08-05 MED ORDER — MIDAZOLAM HCL 5 MG/5ML IJ SOLN
INTRAMUSCULAR | Status: DC | PRN
  Administered 2019-08-05: 2 mg via INTRAVENOUS

## 2019-08-05 MED ORDER — LIDOCAINE 2% (20 MG/ML) 5 ML SYRINGE
INTRAMUSCULAR | Status: AC
  Filled 2019-08-05: qty 5

## 2019-08-05 MED ORDER — BUPIVACAINE HCL (PF) 0.25 % IJ SOLN
INTRAMUSCULAR | Status: AC
  Filled 2019-08-05: qty 30

## 2019-08-05 MED ORDER — LACTATED RINGERS IV SOLN: INTRAVENOUS | Status: DC

## 2019-08-05 MED ORDER — LIDOCAINE HCL (PF) 1 % IJ SOLN
INTRAMUSCULAR | Status: AC
  Filled 2019-08-05: qty 30

## 2019-08-05 MED ORDER — BUPIVACAINE HCL (PF) 0.25 % IJ SOLN
INTRAMUSCULAR | Status: DC | PRN
  Administered 2019-08-05: 10 mL

## 2019-08-05 MED ORDER — ONDANSETRON HCL 4 MG/2ML IJ SOLN
INTRAMUSCULAR | Status: AC
  Filled 2019-08-05: qty 2

## 2019-08-05 MED ORDER — PROMETHAZINE HCL 25 MG/ML IJ SOLN: 6.2500 mg | INTRAMUSCULAR | Status: DC | PRN

## 2019-08-05 MED ORDER — ONDANSETRON HCL 4 MG/2ML IJ SOLN
INTRAMUSCULAR | Status: DC | PRN
  Administered 2019-08-05: 4 mg via INTRAVENOUS

## 2019-08-05 MED ORDER — FENTANYL CITRATE (PF) 100 MCG/2ML IJ SOLN
INTRAMUSCULAR | Status: DC | PRN
  Administered 2019-08-05: 50 ug via INTRAVENOUS

## 2019-08-05 MED ORDER — CEFAZOLIN SODIUM-DEXTROSE 2-4 GM/100ML-% IV SOLN
INTRAVENOUS | Status: AC
  Filled 2019-08-05: qty 100

## 2019-08-05 MED ORDER — DEXAMETHASONE SODIUM PHOSPHATE 10 MG/ML IJ SOLN
INTRAMUSCULAR | Status: AC
  Filled 2019-08-05: qty 1

## 2019-08-05 MED ORDER — MIDAZOLAM HCL 2 MG/2ML IJ SOLN
INTRAMUSCULAR | Status: AC
  Filled 2019-08-05: qty 2

## 2019-08-05 MED ORDER — CEFAZOLIN SODIUM-DEXTROSE 2-4 GM/100ML-% IV SOLN
2.0000 g | INTRAVENOUS | Status: AC
  Administered 2019-08-05: 2 g via INTRAVENOUS

## 2019-08-05 MED ORDER — OXYCODONE HCL 5 MG PO TABS: 5.0000 mg | ORAL_TABLET | Freq: Once | ORAL | Status: DC | PRN

## 2019-08-05 MED ORDER — TRAMADOL HCL 50 MG PO TABS: 50.0000 mg | ORAL_TABLET | Freq: Four times a day (QID) | ORAL | 0 refills | Status: DC | PRN

## 2019-08-05 MED ORDER — PROPOFOL 500 MG/50ML IV EMUL
INTRAVENOUS | Status: AC
  Filled 2019-08-05: qty 50

## 2019-08-05 MED ORDER — FENTANYL CITRATE (PF) 100 MCG/2ML IJ SOLN
INTRAMUSCULAR | Status: AC
  Filled 2019-08-05: qty 2

## 2019-08-05 MED ORDER — CHLORHEXIDINE GLUCONATE 4 % EX LIQD: 60.0000 mL | Freq: Once | CUTANEOUS | Status: DC

## 2019-08-05 MED FILL — traMADol HCL 50 MG TABS: 50 | 5 days supply | Qty: 20 | Fill #0

## 2019-08-05 SURGICAL SUPPLY — 39 items
BLADE SURG 15 STRL LF DISP TIS (BLADE) ×1 IMPLANT
BLADE SURG 15 STRL SS (BLADE) ×2
BNDG COHESIVE 3X5 TAN STRL LF (GAUZE/BANDAGES/DRESSINGS) ×3 IMPLANT
BNDG ESMARK 4X9 LF (GAUZE/BANDAGES/DRESSINGS) ×2 IMPLANT
BNDG GAUZE ELAST 4 BULKY (GAUZE/BANDAGES/DRESSINGS) ×3 IMPLANT
CHLORAPREP W/TINT 26 (MISCELLANEOUS) ×3 IMPLANT
CORD BIPOLAR FORCEPS 12FT (ELECTRODE) ×3 IMPLANT
COVER BACK TABLE 60X90IN (DRAPES) ×3 IMPLANT
COVER MAYO STAND STRL (DRAPES) ×3 IMPLANT
COVER WAND RF STERILE (DRAPES) IMPLANT
CUFF TOURN SGL QUICK 18X4 (TOURNIQUET CUFF) ×3 IMPLANT
DRAPE EXTREMITY T 121X128X90 (DISPOSABLE) ×3 IMPLANT
DRAPE SURG 17X23 STRL (DRAPES) ×3 IMPLANT
DRSG PAD ABDOMINAL 8X10 ST (GAUZE/BANDAGES/DRESSINGS) ×3 IMPLANT
GAUZE SPONGE 4X4 12PLY STRL (GAUZE/BANDAGES/DRESSINGS) ×3 IMPLANT
GAUZE XEROFORM 1X8 LF (GAUZE/BANDAGES/DRESSINGS) ×3 IMPLANT
GLOVE BIOGEL M 6.5 STRL (GLOVE) ×2 IMPLANT
GLOVE BIOGEL PI IND STRL 6.5 (GLOVE) IMPLANT
GLOVE BIOGEL PI IND STRL 7.0 (GLOVE) IMPLANT
GLOVE BIOGEL PI IND STRL 8.5 (GLOVE) ×1 IMPLANT
GLOVE BIOGEL PI INDICATOR 6.5 (GLOVE) ×4
GLOVE BIOGEL PI INDICATOR 7.0 (GLOVE) ×2
GLOVE BIOGEL PI INDICATOR 8.5 (GLOVE) ×2
GLOVE SURG ORTHO 8.0 STRL STRW (GLOVE) ×3 IMPLANT
GLOVE SURG SS PI 7.0 STRL IVOR (GLOVE) ×2 IMPLANT
GOWN STRL REUS W/ TWL LRG LVL3 (GOWN DISPOSABLE) ×1 IMPLANT
GOWN STRL REUS W/TWL LRG LVL3 (GOWN DISPOSABLE) ×6
GOWN STRL REUS W/TWL XL LVL3 (GOWN DISPOSABLE) ×3 IMPLANT
NDL PRECISIONGLIDE 27X1.5 (NEEDLE) IMPLANT
NEEDLE PRECISIONGLIDE 27X1.5 (NEEDLE) ×3 IMPLANT
NS IRRIG 1000ML POUR BTL (IV SOLUTION) ×3 IMPLANT
PACK BASIN DAY SURGERY FS (CUSTOM PROCEDURE TRAY) ×3 IMPLANT
STOCKINETTE 4X48 STRL (DRAPES) ×3 IMPLANT
SUT ETHILON 4 0 PS 2 18 (SUTURE) ×3 IMPLANT
SUT VICRYL 4-0 PS2 18IN ABS (SUTURE) IMPLANT
SYR BULB 3OZ (MISCELLANEOUS) ×3 IMPLANT
SYR CONTROL 10ML LL (SYRINGE) ×2 IMPLANT
TOWEL GREEN STERILE FF (TOWEL DISPOSABLE) ×3 IMPLANT
UNDERPAD 30X36 HEAVY ABSORB (UNDERPADS AND DIAPERS) ×3 IMPLANT

## 2019-08-05 NOTE — Anesthesia Procedure Notes (Signed)
Anesthesia Regional Block: Bier block (IV Regional)   Pre-Anesthetic Checklist: ,, timeout performed, Correct Patient, Correct Site, Correct Laterality, Correct Procedure,, site marked, surgical consent,, at surgeon's request  Laterality: Right     Needles:  Injection technique: Single-shot  Needle Type: Other      Needle Gauge: 22     Additional Needles:   Procedures:,,,,, intact distal pulses, Esmarch exsanguination, single tourniquet utilized,  Narrative:  Start time: 08/05/2019 8:44 AM End time: 08/05/2019 8:45 AM Injection made incrementally with aspirations every 33 mL.  Performed by: Personally

## 2019-08-05 NOTE — Anesthesia Postprocedure Evaluation (Signed)
Anesthesia Post Note  Patient: SHERNELL SALDIERNA  Procedure(s) Performed: RIGHT CARPAL TUNNEL RELEASE (Right Arm Lower)     Patient location during evaluation: PACU Anesthesia Type: Bier Block Level of consciousness: awake and alert Pain management: pain level controlled Vital Signs Assessment: post-procedure vital signs reviewed and stable Respiratory status: spontaneous breathing, nonlabored ventilation and respiratory function stable Cardiovascular status: blood pressure returned to baseline and stable Postop Assessment: no apparent nausea or vomiting Anesthetic complications: no    Last Vitals:  Vitals:   08/05/19 0930 08/05/19 0945  BP: (!) 95/57 (!) 89/75  Pulse: 71 71  Resp: 19 18  Temp:    SpO2: 100% 92%    Last Pain:  Vitals:   08/05/19 0945  TempSrc:   PainSc: 0-No pain                 Lowella Curb

## 2019-08-05 NOTE — Transfer of Care (Signed)
Immediate Anesthesia Transfer of Care Note  Patient: Ashley Franklin  Procedure(s) Performed: RIGHT CARPAL TUNNEL RELEASE (Right Arm Lower)  Patient Location: PACU  Anesthesia Type:Bier block  Level of Consciousness: awake and alert   Airway & Oxygen Therapy: Patient Spontanous Breathing and Patient connected to face mask oxygen  Post-op Assessment: Report given to RN and Post -op Vital signs reviewed and stable  Post vital signs: Reviewed and stable  Last Vitals:  Vitals Value Taken Time  BP 84/35 08/05/19 0915  Temp    Pulse 70 08/05/19 0915  Resp 22 08/05/19 0915  SpO2 96 % 08/05/19 0915    Last Pain:  Vitals:   08/05/19 0743  TempSrc: Temporal  PainSc: 0-No pain         Complications: No apparent anesthesia complications

## 2019-08-05 NOTE — Brief Op Note (Signed)
08/05/2019  9:21 AM  PATIENT:  Ashley Franklin  49 y.o. female  PRE-OPERATIVE DIAGNOSIS:  RIGHT CARPAL TUNNEL SYNDROME  POST-OPERATIVE DIAGNOSIS:  RIGHT CARPAL TUNNEL SYNDROME  PROCEDURE:  Procedure(s) with comments: RIGHT CARPAL TUNNEL RELEASE (Right) - IV REGIONAL FOREARM BLOCK  SURGEON:  Surgeon(s) and Role:    * Cindee Salt, MD - Primary  PHYSICIAN ASSISTANT:   ASSISTANTS: none   ANESTHESIA:   local, regional and IV sedation  EBL: 41ml  BLOOD ADMINISTERED:none  DRAINS: none   LOCAL MEDICATIONS USED:  BUPIVICAINE   SPECIMEN:  No Specimen  DISPOSITION OF SPECIMEN:  N/A  COUNTS:  YES  TOURNIQUET:   Total Tourniquet Time Documented: Forearm (Right) - 23 minutes Total: Forearm (Right) - 23 minutes   DICTATION: .Reubin Milan Dictation  PLAN OF CARE: Discharge to home after PACU  PATIENT DISPOSITION:  PACU - hemodynamically stable.

## 2019-08-05 NOTE — Op Note (Signed)
NAME: Ashley Franklin MEDICAL RECORD NO: [id number] DATE OF BIRTH: [birth year] FACILITY: [facility] LOCATION: [facility] PHYSICIAN: Nicki Reaper, MD   OPERATIVE REPORT   DATE OF PROCEDURE: 08/05/19    PREOPERATIVE DIAGNOSIS:   Carpal tunnel syndrome right hand   POSTOPERATIVE DIAGNOSIS:   Same   PROCEDURE:   Decompression median nerve right hand   SURGEON: Cindee Salt, M.D.   ASSISTANT: none   ANESTHESIA:  Bier block with sedation and Local   INTRAVENOUS FLUIDS:  Per anesthesia flow sheet.   ESTIMATED BLOOD LOSS:  Minimal.   COMPLICATIONS:  None.   SPECIMENS:  none   TOURNIQUET TIME:    Total Tourniquet Time Documented: Forearm (Right) - 23 minutes Total: Forearm (Right) - 23 minutes    DISPOSITION:  Stable to PACU.   INDICATIONS: Patient is a 49 year old female with a history of numbness and tingling of her right hand.  This not responded to conservative treatment.  Nerve conductions are positive.  She has elected undergo surgical decompression of the median nerve right hand.  Preperi-and postoperative course been discussed along with risks and complications.  She is aware that there is no guarantee to the surgery the possibility of infection recurrence injury to arteries nerves tendons complete relief of symptoms and dystrophy.  In the preoperative area the patient is seen the extremity marked by both patient and surgeon antibiotic given  OPERATIVE COURSE: Patient is brought to the operating room placed in the supine position prepped and draped using ChloraPrep.  This was done after a timeout was taken to confirm patient procedure.  A forearm-based IV regional anesthetic was carried out prior to the prep and draping.  After adequate anesthesia was for longitudinal was made using the right palm carried down through subcutaneous tissue.  Bleeders were electrocauterized with bipolar.  The palmar fascia was split.  The superficial palmar arch was identified along  with the flexor tendon to the ring little finger.  Retractors were placed retracting median nerve flexor tendons radially ulnar nerve ulnarly.  The flexor retinaculum was then released on its ulnar border.  A right angle and stool retractor placed between skin and forearm fascia after dissecting the subcutaneous tissue free.  Deep structures were dissected free from the overlying flexor retinaculum and distal forearm fascia with blunt scissors.  The proximal aspect of the flexor retinaculum distal forearm fascia was then released with the blunt scissors for approximately 2 to 3 cm proximal to the wrist crease under direct vision.  The canal was explored.  An area compression of the nerve was apparent.  Motor branch entering the muscle distally.  The wound was copious irrigated with saline.  The skin was closed interrupted 4 nylon sutures.  A sterile compressive dressing with the fingers free was applied.  Deflation of the tourniquet all fingers immediately pink.  She was taken to the recovery room for observation in satisfactory condition.  She will be discharged home turgor to return to the hand center [facility] in 1week Tylenol ibuprofen for pain with Ultram for breakthrough.   Cindee Salt, MD Electronically signed, 08/05/19

## 2019-08-05 NOTE — Progress Notes (Signed)
Virtual Visit via Video Note  I connected with Ashley Franklin on 08/11/19 at  1:00 PM EDT by a video enabled telemedicine application and verified that I am speaking with the correct person using two identifiers.   I discussed the limitations of evaluation and management by telemedicine and the availability of in person appointments. The patient expressed understanding and agreed to proceed.    I discussed the assessment and treatment plan with the patient. The patient was provided an opportunity to ask questions and all were answered. The patient agreed with the plan and demonstrated an understanding of the instructions.   The patient was advised to call back or seek an in-person evaluation if the symptoms worsen or if the condition fails to improve as anticipated.  I provided 20 minutes of non-face-to-face time during this encounter.   Norman Clay, MD   [facility] MD/PA/NP OP Progress Note  08/11/2019 1:36 PM Ashley Franklin  MRN:  [id number]  Chief Complaint:  Chief Complaint    Follow-up; Other     HPI:  This is a follow-up appointment for bipolar disorder.  She states that the [place] separated her from work the day after tomorrow. She is concerned as she will also lose insurance.  Although she is hoping to hire a lawyer, she will be unable to afford upfront. She was able to see a new PCP, and is hoping to continue to see her.  She was referred to a sleep specialist in [facility].  She continues to feel fatigued and sleeps 18 hours/day.  She has started to notice "tremors" in her hands, upper body, regardless of her position (posturing/resting).  She is concerned that she has been "impulsive." She talks about an episode of her feeling the need to stand up in funeral, and sing happy birthday to make everybody laugh. Although she denies acting on these thoughts, she tends to have these thoughts that are "inappropriate." She also was very anxious, fearing that she might find a dead body  while taking a walk last night. Those are ego dystonic. She denies feeling depressed. She has fair appetite.  She denies SI.  She feels anxious and tense.  She denies panic attacks.  She denies decreased need for sleep or euphoria. She has not noticed andy side effect since uptitration of lamotrigine.    Visit Diagnosis:    ICD-10-CM   1. Bipolar affective disorder, currently depressed, mild (HCC)  F31.31 Valproic Acid level    Valproic Acid level    Past Psychiatric History: Please see initial evaluation for full details. I have reviewed the history. No updates at this time.     Past Medical History:  Past Medical History:  Diagnosis Date  . Anemia   . Anxiety   . Bipolar affective disorder ([place])   . Blood transfusion without reported diagnosis   . Bronchospasm with bronchitis, acute   . Depression   . Kidney stone   . Right carpal tunnel syndrome 12/09/2018  . SBO (small bowel obstruction) ([place]) 01/2014  . Sleep apnea     Past Surgical History:  Procedure Laterality Date  . CARPAL TUNNEL RELEASE Right 08/05/2019   Procedure: RIGHT CARPAL TUNNEL RELEASE;  Surgeon: Daryll Brod, MD;  Location: [facility];  Service: Orthopedics;  Laterality: Right;  IV REGIONAL FOREARM BLOCK  . CESAREAN SECTION    . ENDOMETRIAL ABLATION    . GASTRIC BYPASS  2001  . SBO  2015    Family Psychiatric History:  Please see initial evaluation for full details. I have reviewed the history. No updates at this time.     Family History:  Family History  Problem Relation Age of Onset  . Diabetes Mother   . Cancer Mother 60       Throat   . Bipolar disorder Mother   . Bipolar disorder Sister   . Bipolar disorder Brother   . Drug abuse Brother   . Bipolar disorder Sister   . Colon cancer Neg Hx   . Cancer - Prostate Neg Hx   . Breast cancer Neg Hx     Social History:  Social History   Socioeconomic History  . Marital status: Married    Spouse name: Not on file  . Number of  children: 2  . Years of education: Not on file  . Highest education level: Not on file  Occupational History  . Occupation: phelbotomist    Employer:   Tobacco Use  . Smoking status: Never Smoker  . Smokeless tobacco: Never Used  . Tobacco comment: 1/2 ppd   Substance and Sexual Activity  . Alcohol use: Yes    Alcohol/week: 0.0 standard drinks    Comment: Two times a week.   . Drug use: No  . Sexual activity: Yes    Partners: Male    Birth control/protection: Surgical  Other Topics Concern  . 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 Difficulty of Paying Living Expenses:   Food Insecurity:   . Worried About Programme researcher, broadcasting/film/video in the Last Year:   . Barista in the Last Year:   Transportation Needs:   . Freight forwarder (Medical):   Marland Kitchen Lack of Transportation (Non-Medical):   Physical Activity:   . Days of Exercise per Week:   . Minutes of Exercise per Session:   Stress:   . Feeling of Stress :   Social Connections:   . Frequency of Communication with Friends and Family:   . Frequency of Social Gatherings with Friends and Family:   . Attends Religious Services:   . Active Member of Clubs or Organizations:   . Attends Banker Meetings:   Marland Kitchen Marital Status:     Allergies:  Allergies  Allergen Reactions  . Nsaids Other (See Comments)    Contraindicated with gastric bypass    Metabolic Disorder Labs: Lab Results  Component Value Date   HGBA1C 4.9 02/06/2017   MPG 105 02/26/2015   No results found for: PROLACTIN Lab Results  Component  Value Date   CHOL 165 02/06/2017   TRIG 87 02/06/2017   HDL 54 02/06/2017   LDLCALC 94 02/06/2017   Lab Results  Component Value Date   TSH 1.12 07/10/2017   TSH 1.22 06/08/2016    Therapeutic Level Labs: No results found for: LITHIUM Lab Results  Component Value Date   VALPROATE 71 01/27/2019   VALPROATE 64 10/02/2018   No components found for:  CBMZ  Current  Medications: Current Outpatient Medications  Medication Sig Dispense Refill  . divalproex (DEPAKOTE ER) 500 MG 24 hr tablet Take 4 tablets (2,000 mg total) by mouth at bedtime. 360 tablet 0  . FLUoxetine (PROZAC) 20 MG tablet Take 4 tablets (80 mg total) by mouth daily. 360 tablet 0  . lamoTRIgine (LAMICTAL) 100 MG tablet Take 1 tablet (100 mg total) by mouth daily. 90 tablet 0  . Lurasidone HCl 60 MG TABS Take 1 tablet (60 mg total) by mouth daily. 90 tablet 0  . meloxicam (MOBIC) 7.5 MG tablet Take 1 tablet by mouth 2 (two) times daily.    . traMADol (ULTRAM) 50 MG tablet Take 1 tablet (50 mg total) by mouth every 6 (six) hours as needed. 20 tablet 0   No current facility-administered medications for this visit.     Musculoskeletal: Strength & Muscle Tone: N/A Gait & Station: N/A Patient leans: N/A  Psychiatric Specialty Exam: Review of Systems  Neurological: Positive for tremors.  Psychiatric/Behavioral: Positive for sleep disturbance. Negative for agitation, behavioral problems, confusion, decreased concentration, dysphoric mood, hallucinations, self-injury and suicidal ideas. The patient is nervous/anxious. The patient is not hyperactive.   All other systems reviewed and are negative.   There were no vitals taken for this visit.There is no height or weight on file to calculate BMI.  General Appearance: Fairly Groomed  Eye Contact:  Fair  Speech:  Clear and Coherent  Volume:  Normal  Mood:  Anxious  Affect:  Appropriate, Congruent and down, restricted  Thought Process:  Coherent  Orientation:  Full  (Time, Place, and Person)  Thought Content: Logical   Suicidal Thoughts:  No  Homicidal Thoughts:  No  Memory:  Immediate;   Good  Judgement:  Good  Insight:  Good  Psychomotor Activity:  Normal bilateral postural tremors on her hands  Concentration:  Concentration: Good and Attention Span: Good  Recall:  Good  Fund of Knowledge: Good  Language: Good  Akathisia:  No  Handed:  Right  AIMS (if indicated): not done  Assets:  Communication Skills Desire for Improvement  ADL's:  Intact  Cognition: WNL  Sleep:  Poor   Screenings: PHQ2-9     Office Visit from 07/31/2019 in [facility] from 03/06/2017 [facility] Office Visit from 06/08/2016 in [facility] Office Visit from 03/15/2015 in [facility] Office Visit from 01/12/2015 in [facility]  PHQ-2 Total Score  0  0  0  0  0  PHQ-9 Total Score  -  -  2  -  -       Assessment and Plan:  LEVETTE PAULICK is a 49 y.o. year old female with a history of bipolar disorder, anxiety, anemia,  obesity s/p gastric bypass surgery , who presents for follow up appointment for Bipolar affective disorder, currently depressed, mild (HCC) - Plan: Valproic Acid level, Valproic Acid level  # Bipolar disorder, most recent episode depressed # unspecified anxiety disorder There has been overall improvement in depressive symptoms and anxiety despite that she will be terminated from work in a few days after up titration of lamotrigine.  Will continue current medication regimen.  Will continue Depakote, latuda for mood dysregulation.  We will obtain labs.  We will continue lamotrigine for mood dysregulation.  Discussed potential risk of Stevens-Johnson syndrome.  We will continue fluoxetine for depression. Noted that she complains of acute worsening in  tremors. Although depakote, latuda, fluoxetine can cause  tremors, the clinical course of acute worsening is atypical given that patient has been on the same regimen for a while. She is advised to discuss with PCP.   # Hypersomnia She continues to have hypersomnia with significant fatigue.  She will see a sleep specialist in [facility].   Plan: I have reviewed and updated plans as below 1.ContinueDepakote 2000 mg at night  - check VPA (CBC, LFT reviewed wnl, 04/2019) 2. Continue fluoxetine 80 mg daily  3.Increaselamotrigine100mg  daily 4.Continuelatuda 60 mg daily 5. Next appointment: 5/6 at 2:30 for 30 mins, video (she may not be able to attend this appointment if she loses her insurance. She is advised to continue her medication under the care by her PCP if that is the case.)  Past trials of medication: Lexapro, Paxil, amitriptyline, Abilify (irritable), oxcarbazepine, perphenazine, Trifluoperazine, Klonopin, Valium. Vistaril,Ambien   The patient demonstrates the following risk factors for suicide: Chronic risk factorsfor suicide include psychiatric disorder /bipolar disorder,previous self-harm of scratching herself. Acute risk factorsfor suicide includenone. Protective factorsfor this patient include positive social support, positive therapeutic relationship,hope for the future. Considering these factors, the overall suicide risk at this point appears to be low. Patient does have gun access at home and agrees to lock guns. Discussed in detail safety plan that anytime having active suicidal thoughts or homicidal thoughts and she need to call 911 or go to local emergency room.  [name], MD 08/11/2019, 1:36 PM

## 2019-08-05 NOTE — Discharge Instructions (Addendum)

## 2019-08-05 NOTE — H&P (Signed)
Ashley Franklin is an 49 y.o. female.   Chief Complaint: numbness right hand HPI: Ashley Franklin is a 49 year old right-hand-dominant female referred by Dr. Jannifer Franklin and Dr. Larose Franklin for consultation regarding carpal tunnel syndrome of her right hand. Dates this been going on for several years possibly 5-6. She states it got worse after having her left elbow lateral epicondylitis operated on by Dr. Grandville Franklin for Worker's Comp. injury. She has forced her to use her right hand and this has aggravated it to the point that it now wakes her up 3-4 out of 7 nights with relatively constant numbness and tingling thumb through ring fingers right handaggravated by gripping driving. She has no history of injury to the hand or to the neck. She has been wearing a brace to try to help which has not significantly helped. She had an injection in 2020 which had given her relief.   she has had nerve conductions done by Dr. Jannifer Franklin which are reviewed which she is read out as carpal tunnel syndrome right hand mild severity with no response to the sensory component we do not have a motor component numbers. He has no history of diabetes thyroid problems arthritis or gout.    Past Medical History:  Diagnosis Date  . Anemia   . Anxiety   . Bipolar affective disorder ([place])   . Blood transfusion without reported diagnosis   . Bronchospasm with bronchitis, acute   . Depression   . Kidney stone   . Right carpal tunnel syndrome 12/09/2018  . SBO (small bowel obstruction) ([place]) 01/2014  . Sleep apnea     Past Surgical History:  Procedure Laterality Date  . CESAREAN SECTION    . ENDOMETRIAL ABLATION    . GASTRIC BYPASS  2001  . SBO  2015    Family History  Problem Relation Age of Onset  . Diabetes Mother   . Cancer Mother 60       Throat   . Bipolar disorder Mother   . Bipolar disorder Sister   . Bipolar disorder Brother   . Drug abuse Brother   . Bipolar disorder Sister   . Colon cancer Neg Hx   . Cancer - Prostate Neg Hx    . Breast cancer Neg Hx    Social History:  reports that she has never smoked. She has never used smokeless tobacco. She reports current alcohol use. She reports that she does not use drugs.  Allergies:  Allergies  Allergen Reactions  . Nsaids Other (See Comments)    Contraindicated with gastric bypass    No medications prior to admission.    No results found for this or any previous visit (from the past 48 hour(s)).  No results found.   Pertinent items are noted in HPI.  Height 5\' 4"  (1.626 m), weight 108.9 kg.  General appearance: alert, cooperative and appears stated age Head: Normocephalic, without obvious abnormality Neck: no JVD Resp: clear to auscultation bilaterally Cardio: regular rate and rhythm, S1, S2 normal, no murmur, click, rub or gallop GI: soft, non-tender; bowel sounds normal; no masses,  no organomegaly Extremities: numbness right hand Pulses: 2+ and symmetric Skin: Skin color, texture, turgor normal. No rashes or lesions Neurologic: Grossly normal Incision/Wound: na  Assessment/Plan Assessment:  1. Carpal tunnel syndrome of right wrist    Plan: Would like to proceed to have the carpal tunnel right side release. Preperi-and postoperative course been discussed along with risks and complications. She is aware that there is no guarantee to  the surgery the possibility of infection recurrence injury to arteries nerves tendons incomplete relief symptoms and dystrophy. She is scheduled for carpal tunnel release right hand as an outpatient under regional anesthesia.   Cindee Salt 08/05/2019, 5:20 AM

## 2019-08-05 NOTE — Anesthesia Preprocedure Evaluation (Signed)
Anesthesia Evaluation  Patient identified by MRN, date of birth, ID band Patient awake    Reviewed: Allergy & Precautions, NPO status , Patient's Chart, lab work & pertinent test results  Airway Mallampati: II  TM Distance: >3 FB Neck ROM: Full    Dental no notable dental hx.    Pulmonary sleep apnea ,    Pulmonary exam normal breath sounds clear to auscultation       Cardiovascular negative cardio ROS Normal cardiovascular exam Rhythm:Regular Rate:Normal     Neuro/Psych Anxiety Depression Bipolar Disorder negative neurological ROS  negative psych ROS   GI/Hepatic negative GI ROS, Neg liver ROS,   Endo/Other  Morbid obesity  Renal/GU negative Renal ROS  negative genitourinary   Musculoskeletal negative musculoskeletal ROS (+)   Abdominal (+) + obese,   Peds negative pediatric ROS (+)  Hematology negative hematology ROS (+)   Anesthesia Other Findings   Reproductive/Obstetrics negative OB ROS                             Anesthesia Physical Anesthesia Plan  ASA: III  Anesthesia Plan: Bier Block and Bier Block-LIDOCAINE ONLY   Post-op Pain Management:    Induction: Intravenous  PONV Risk Score and Plan: 2 and Ondansetron, Midazolam and Treatment may vary due to age or medical condition  Airway Management Planned: Simple Face Mask  Additional Equipment:   Intra-op Plan:   Post-operative Plan:   Informed Consent: I have reviewed the patients History and Physical, chart, labs and discussed the procedure including the risks, benefits and alternatives for the proposed anesthesia with the patient or authorized representative who has indicated his/her understanding and acceptance.     Dental advisory given  Plan Discussed with: CRNA  Anesthesia Plan Comments:         Anesthesia Quick Evaluation

## 2019-08-06 ENCOUNTER — Encounter: Payer: Self-pay | Admitting: *Deleted

## 2019-08-11 ENCOUNTER — Ambulatory Visit (INDEPENDENT_AMBULATORY_CARE_PROVIDER_SITE_OTHER): Payer: No Typology Code available for payment source | Admitting: Psychiatry

## 2019-08-11 ENCOUNTER — Other Ambulatory Visit: Payer: Self-pay

## 2019-08-11 ENCOUNTER — Encounter (HOSPITAL_COMMUNITY): Payer: Self-pay | Admitting: Psychiatry

## 2019-08-11 ENCOUNTER — Telehealth: Payer: Self-pay | Admitting: Internal Medicine

## 2019-08-11 DIAGNOSIS — F3131 Bipolar disorder, current episode depressed, mild: Secondary | ICD-10-CM | POA: Diagnosis not present

## 2019-08-11 NOTE — Patient Instructions (Addendum)
1.ContinueDepakote 2000 mg at night  - obtain blood test/depakote level  2. Continue fluoxetine 80 mg daily  3.Increaselamotrigine100mg  daily 4.Continuelatuda 60 mg daily 5. Next appointment: 5/6 at 2:30

## 2019-08-11 NOTE — Telephone Encounter (Signed)
Authorization approved for Consult for [facility]. If they need more visits with patient (the speciality) office has to call [facility] to get more visits. Only consult was approved.  Approval number [id number]. I have faxed over form with approval so the referral process can move forward

## 2019-08-14 MED FILL — traMADol HCL 50 MG TABS: 50 | 3 days supply | Qty: 20 | Fill #0

## 2019-08-28 NOTE — Progress Notes (Signed)
Virtual Visit via Video Note  I connected with Ashley Franklin on 09/02/19 at  8:30 AM EDT by a video enabled telemedicine application and verified that I am speaking with the correct person using two identifiers.   I discussed the limitations of evaluation and management by telemedicine and the availability of in person appointments. The patient expressed understanding and agreed to proceed.    I discussed the assessment and treatment plan with the patient. The patient was provided an opportunity to ask questions and all were answered. The patient agreed with the plan and demonstrated an understanding of the instructions.   The patient was advised to call back or seek an in-person evaluation if the symptoms worsen or if the condition fails to improve as anticipated.  I provided 15 minutes of non-face-to-face time during this encounter.   [name], MD    [facility] MD/PA/NP OP Progress Note  09/02/2019 9:40 AM Ashley Franklin  MRN:  [id number]  Chief Complaint:  Chief Complaint    Anxiety; Depression; Follow-up; Other     HPI:  This is a follow-up appointment for bipolar disorder.  She states that she wanted to have this appointment to discuss about her tremors.  She notices some muscle spasms in her shoulder, arms.  She believes it has worsened over the past few months.  It usually worsens when she tries to hold something. She denies significant tremors in resting position.  She denies any family history of tremors.  She denies any alleviation when she drinks alcohol.   She states that her mood has been better than ever.  She denies feeling depressed.  She has been enjoying going outside.  She has joined a gym, and she enjoys taking a walk.  She has been eating healthier.  She sleeps 18 hours, and still feels exhausted.  She has fair concentration.  She denies SI.  She occasionally feels anxious and tense, although it has been improving.  She denies panic attacks.  She denies decreased  need for sleep or euphoria.  She denies increased goal-directed activity.   She states that she will lose insurance this month.  Her husband does not have any insurance.  She is planning to continue to see her primary care doctor.   Wt Readings from Last 3 Encounters:  09/01/19 243 lb (110.2 kg)  08/05/19 253 lb 15.5 oz (115.2 kg)  07/31/19 253 lb 9.6 oz (115 kg)    Visit Diagnosis:    ICD-10-CM   1. Occasional tremors  R25.1   2. Bipolar affective disorder, currently depressed, mild (HCC)  F31.31 FLUoxetine (PROZAC) 20 MG tablet    Past Psychiatric History: Please see initial evaluation for full details. I have reviewed the history. No updates at this time.     Past Medical History:  Past Medical History:  Diagnosis Date  . Anemia   . Anxiety   . Bipolar affective disorder (HCC)   . Blood transfusion without reported diagnosis   . Bronchospasm with bronchitis, acute   . Depression   . Kidney stone   . Right carpal tunnel syndrome 12/09/2018  . SBO (small bowel obstruction) (HCC) 01/2014  . Sleep apnea     Past Surgical History:  Procedure Laterality Date  . CARPAL TUNNEL RELEASE Right 08/05/2019   Procedure: RIGHT CARPAL TUNNEL RELEASE;  Surgeon: Cindee Salt, MD;  Location: [facility];  Service: Orthopedics;  Laterality: Right;  IV REGIONAL FOREARM BLOCK  . CESAREAN SECTION    .  ENDOMETRIAL ABLATION    . GASTRIC BYPASS  2001  . SBO  2015    Family Psychiatric History: Please see initial evaluation for full details. I have reviewed the history. No updates at this time.     Family History:  Family History  Problem Relation Age of Onset  . Diabetes Mother   . Cancer Mother 60       Throat   . Bipolar disorder Mother   . Bipolar disorder Sister   . Bipolar disorder Brother   . Drug abuse Brother   . Bipolar disorder Sister   . Colon cancer Neg Hx   . Cancer - Prostate Neg Hx   . Breast cancer Neg Hx     Social History:  Social History    Socioeconomic History  . Marital status: Married    Spouse name: Not on file  . Number of children: 2  . Years of education: Not on file  . Highest education level: Not on file  Occupational History  . Occupation: phelbotomist    Employer: [employer]  Tobacco Use  . Smoking status: Never Smoker  . Smokeless tobacco: Never Used  . Tobacco comment: 1/2 ppd   Substance and Sexual Activity  . Alcohol use: Yes    Alcohol/week: 0.0 standard drinks    Comment: Two times a week.   . Drug use: No  . Sexual activity: Yes    Partners: Male    Birth control/protection: Surgical  Other Topics Concern  . 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 Difficulty of Paying Living Expenses:   Food Insecurity:   . Worried About Charity fundraiser in the Last Year:   . Arboriculturist in the Last Year:   Transportation Needs:   . Film/video editor (Medical):   Marland Kitchen Lack of Transportation (Non-Medical):   Physical Activity:   . Days of Exercise per Week:   . Minutes of Exercise per Session:   Stress:   . Feeling of Stress :   Social Connections:   . Frequency of Communication with Friends and Family:   . Frequency of Social Gatherings with Friends and Family:   . Attends Religious Services:   . Active Member of Clubs or Organizations:   . Attends Archivist Meetings:   Marland Kitchen Marital Status:     Allergies:  Allergies  Allergen Reactions  . Nsaids Other (See Comments)    Contraindicated with gastric bypass    Metabolic Disorder Labs: Lab Results  Component Value Date   HGBA1C 4.9  02/06/2017   MPG 105 02/26/2015   No results found for: PROLACTIN Lab Results  Component Value Date   CHOL 165 02/06/2017   TRIG 87 02/06/2017   HDL 54 02/06/2017   LDLCALC 94 02/06/2017   Lab Results  Component Value Date   TSH 1.12 07/10/2017   TSH 1.22 06/08/2016    Therapeutic Level Labs: No results found for: LITHIUM Lab Results  Component Value  Date   VALPROATE 71 01/27/2019   VALPROATE 64 10/02/2018   No components found for:  CBMZ  Current Medications: Current Outpatient Medications  Medication Sig Dispense Refill  . divalproex (DEPAKOTE ER) 500 MG 24 hr tablet Take 4 tablets (2,000 mg total) by mouth at bedtime. 360 tablet 0  . FLUoxetine (PROZAC) 20 MG tablet Take 3 tablets (60 mg total) by mouth daily. 270 tablet 0  . lamoTRIgine (LAMICTAL) 100 MG tablet Take 1 tablet (100 mg total) by mouth daily. 90 tablet 0  . Lurasidone HCl 60 MG TABS Take 1 tablet (60 mg total) by mouth daily. 90 tablet 0   No current facility-administered medications for this visit.     Musculoskeletal: Strength & Muscle Tone: N/A Gait & Station: N/A Patient leans: N/A  Psychiatric Specialty Exam: Review of Systems  Psychiatric/Behavioral: Positive for sleep disturbance. Negative for agitation, behavioral problems, confusion, decreased concentration, dysphoric mood, hallucinations, self-injury and suicidal ideas. The patient is nervous/anxious. The patient is not hyperactive.   All other systems reviewed and are negative.   There were no vitals taken for this visit.There is no height or weight on file to calculate BMI.  General Appearance: Fairly Groomed  Eye Contact:  Good  Speech:  Clear and Coherent  Volume:  Normal  Mood:  good  Affect:  Appropriate and Congruent  Thought Process:  Coherent  Orientation:  Full (Time, Place, and Person)  Thought Content: Logical   Suicidal Thoughts:  No  Homicidal Thoughts:  No  Memory:  Immediate;   Good  Judgement:  Good  Insight:   Good  Psychomotor Activity:  Normal  Concentration:  Concentration: Good and Attention Span: Good  Recall:  Good  Fund of Knowledge: Good  Language: Good  Akathisia:  No  Handed:  Right  AIMS (if indicated): not done  Assets:  Communication Skills Desire for Improvement  ADL's:  Intact  Cognition: WNL  Sleep:  hypersomnia   Screenings: PHQ2-9     Office Visit from 07/31/2019 in [facility] Office Visit from 06/08/2016 in [facility] Office Visit from 03/15/2015 in [facility] Office Visit from 01/12/2015 in [facility]  PHQ-2 Total Score  0  0  0  0  0  PHQ-9 Total Score  --  --  2  --  --       Assessment and Plan:  Ashley Franklin is a 49 y.o. year old female with a history of bipolar disorder, anxiety, anemia, obesity s/p gastric bypass surgery , who presents for follow up appointment for Occasional tremors  Bipolar affective disorder, currently depressed, mild (HCC) - Plan: FLUoxetine (PROZAC) 20 MG tablet   # Bipolar disorder, most recent episode depressed # unspecified anxiety disorder There has been significant improvement in depressive symptoms and anxiety, which coincided with up titration of lamotrigine.  Will taper her down fluoxetine to minimize risk of tremors.  This medication may be tapered down further if her mood is well stabilized with lamotrigine.  Will continue Depakote for bipolar disorder.  Will obtain depakote level for monitoring especially given its interaction with lamotrigine.  Discussed risk of tremors.  We will continue lamotrigine for bipolar depression.  The risk of lamotrigine, which includes Stevens-Johnson syndrome were discussed.  We will continue Latuda for mood dysregulation.  Discussed potential metabolic side effect.   # Postural tremors  Differential includes  essential tremor, and medication induced tremors secondary to fluoxetine, depakote, latuda. Will taper down fluoxetine to minimize its risk of tremors. Will continue other medication at this time given benefit of treating her mental health outweighs its potential risk.   # Hypersomnia She has significant hypersomnia with fatigue.  Although she was recommended for further evaluation with neurologist, she is unable to do so at this time due to losing her insurance.   Plan: I have reviewed and updated plans as below 1.ContinueDepakote 2000 mg at night - check VPA (CBC, LFT reviewed wnl, 04/2019) 2. Decrease fluoxetine 60 mg daily  3. Continuelamotrigine100mg  daily 4.Continuelatuda 60 mg daily 5. Next appointment:  as needed- she will lose insurance this month. She is advise to be followed up at least by her PCP to continue her care/medication.  Past trials of medication: Lexapro, Paxil, amitriptyline, Abilify (irritable), oxcarbazepine, perphenazine, Trifluoperazine, Klonopin, Valium. Vistaril,Ambien   The patient demonstrates the following risk factors for suicide: Chronic risk factorsfor suicide include psychiatric disorder /bipolar disorder,previous self-harm of scratching herself. Acute risk factorsfor suicide includenone. Protective factorsfor this patient include positive social support, positive therapeutic relationship,hope for the future. Considering these factors, the overall suicide risk at this point appears to be low. Patient does have gun access at homeand agrees to lock guns. Discussed in detail safety plan that anytime having active suicidal thoughts or homicidal thoughts and she need to call 911 or go to local emergency room.    [name], MD 09/02/2019, 9:40 AM

## 2019-09-01 ENCOUNTER — Other Ambulatory Visit: Payer: Self-pay

## 2019-09-01 ENCOUNTER — Ambulatory Visit (INDEPENDENT_AMBULATORY_CARE_PROVIDER_SITE_OTHER): Payer: No Typology Code available for payment source | Admitting: Family Medicine

## 2019-09-01 ENCOUNTER — Encounter: Payer: Self-pay | Admitting: Internal Medicine

## 2019-09-01 VITALS — BP 110/70 | HR 74 | Temp 97.7°F | Wt 243.0 lb

## 2019-09-01 DIAGNOSIS — R251 Tremor, unspecified: Secondary | ICD-10-CM | POA: Diagnosis not present

## 2019-09-01 NOTE — Progress Notes (Signed)
   Subjective:    Patient ID: Ashley Franklin, female    DOB: 1971/03/27, 49 y.o.   MRN: [id number]  HPI Chief Complaint  Patient presents with  . tremors    tremors in both hands,    Complains of intermittent tremors in both hands for the past several years. States it does not usually last more than a couple of weeks when is comes on but this time it has lasted for a solid month.  States she has noticed tremors with eating, writing, holding her phone. States her handwriting has significantly worsened.    She has noticed a muscle twitch in her right arm at times.    No changes in medication, caffeine intake.    Recent carpal tunnel surgery.   Denies fever, chills, dizziness, chest pain, palpitations, shortness of breath, abdominal pain, N/V/D, urinary symptoms, LE edema.     Review of Systems Pertinent positives and negatives in the history of present illness.     Objective:   Physical Exam Constitutional:      General: She is not in acute distress.    Appearance: Normal appearance. She is not ill-appearing.  Eyes:     General: No visual field deficit.    Extraocular Movements: Extraocular movements intact.     Conjunctiva/sclera: Conjunctivae normal.     Pupils: Pupils are equal, round, and reactive to light.  Cardiovascular:     Rate and Rhythm: Normal rate and regular rhythm.     Pulses: Normal pulses.     Heart sounds: Normal heart sounds.  Pulmonary:     Effort: Pulmonary effort is normal.     Breath sounds: Normal breath sounds.  Musculoskeletal:        General: Normal range of motion.     Cervical back: Normal range of motion and neck supple. No rigidity.  Skin:    General: Skin is warm and dry.     Capillary Refill: Capillary refill takes less than 2 seconds.  Neurological:     General: No focal deficit present.     Mental Status: She is alert and oriented to person, place, and time.     Cranial Nerves: No cranial nerve deficit.     Sensory: No sensory  deficit.     Motor: No weakness or pronator drift.     Coordination: Coordination normal.     Gait: Gait is intact.     Deep Tendon Reflexes: Reflexes normal.     Comments: Very mild intention tremor with finger to nose  Psychiatric:        Mood and Affect: Mood normal.        Thought Content: Thought content normal.    BP 110/70   Pulse 74   Temp 97.7 F (36.5 C)   Wt 243 lb (110.2 kg)   BMI 41.71 kg/m         Assessment & Plan:  Tremor of both hands  She appears to have an intention tremor but is not that noticeable on exam today. No red flag symptoms. Normal neuro exam otherwise.  She has an upcoming appointment with neurology and actually is able to contact [facility] neurology to get an earlier appt than [place] [facility] if she prefers.

## 2019-09-01 NOTE — Patient Instructions (Signed)
Your tremors sound like intention or action tremors.   I am referring you to the neurologist for this issue.   Please call and schedule with the neurologist at [facility] as we discussed.

## 2019-09-02 ENCOUNTER — Encounter (HOSPITAL_COMMUNITY): Payer: Self-pay | Admitting: Psychiatry

## 2019-09-02 ENCOUNTER — Telehealth (INDEPENDENT_AMBULATORY_CARE_PROVIDER_SITE_OTHER): Payer: No Typology Code available for payment source | Admitting: Psychiatry

## 2019-09-02 ENCOUNTER — Encounter: Payer: Self-pay | Admitting: Family Medicine

## 2019-09-02 DIAGNOSIS — R251 Tremor, unspecified: Secondary | ICD-10-CM | POA: Diagnosis not present

## 2019-09-02 DIAGNOSIS — F3131 Bipolar disorder, current episode depressed, mild: Secondary | ICD-10-CM

## 2019-09-02 MED ORDER — LAMOTRIGINE 100 MG PO TABS: 100.0000 mg | ORAL_TABLET | Freq: Every day | ORAL | 0 refills | Status: DC

## 2019-09-02 MED ORDER — DIVALPROEX SODIUM ER 500 MG PO TB24: 2000.0000 mg | ORAL_TABLET | Freq: Every day | ORAL | 0 refills | Status: DC

## 2019-09-02 MED ORDER — FLUOXETINE HCL 20 MG PO TABS: 60.0000 mg | ORAL_TABLET | Freq: Every day | ORAL | 0 refills | Status: DC

## 2019-09-02 MED ORDER — LURASIDONE HCL 60 MG PO TABS: 60.0000 mg | ORAL_TABLET | Freq: Every day | ORAL | 0 refills | Status: DC

## 2019-09-02 MED FILL — IBUPROFEN 800 MG TAB: 800 | 5 days supply | Qty: 20 | Fill #0

## 2019-09-02 MED FILL — CHLORHEXIDINE 0.12% RINSE: 0.12 | 30 days supply | Qty: 473 | Fill #0

## 2019-09-02 NOTE — Patient Instructions (Addendum)
1.ContinueDepakote 2000 mg at night 2. Obtain depakote level 3. Decrease fluoxetine 60 mg daily  4. Continuelamotrigine100mg  daily 5.Continuelatuda 60 mg daily 6. Next appointment:  as needed 7. Please follow up with your primary care doctor to continue your care/medication

## 2019-09-03 ENCOUNTER — Telehealth (HOSPITAL_COMMUNITY): Payer: Self-pay | Admitting: Psychiatry

## 2019-09-03 NOTE — Telephone Encounter (Signed)
Left a voice message to obtain blood test for Depakote level. I advised the patient to call back to the office to verify she received this message. Please advise her to get blood test at [facility] for depakote level when she contacts [place] back.

## 2019-09-04 ENCOUNTER — Telehealth (HOSPITAL_COMMUNITY): Payer: Self-pay | Admitting: Psychiatry

## 2019-09-04 NOTE — Telephone Encounter (Signed)
Discussed with the patient. She will plan to get blood test to check Depakote level tomorrow morning.

## 2019-09-18 ENCOUNTER — Ambulatory Visit (HOSPITAL_COMMUNITY): Payer: No Typology Code available for payment source | Admitting: Psychiatry

## 2019-10-28 ENCOUNTER — Telehealth (HOSPITAL_COMMUNITY): Payer: Self-pay | Admitting: *Deleted

## 2019-10-28 NOTE — Telephone Encounter (Signed)
Patient called and stating she is needing refills for her medications that Dr. Vanetta Shawl prescribed her. Per pt she scheduled an appt already.

## 2019-10-28 NOTE — Telephone Encounter (Signed)
LMOM

## 2019-10-30 ENCOUNTER — Other Ambulatory Visit (HOSPITAL_COMMUNITY): Payer: Self-pay | Admitting: Psychiatry

## 2019-10-30 NOTE — Telephone Encounter (Signed)
Patient is scheduled for 11-04-2019. Per pt she is needing refills for all of her medications that Dr. Vanetta Shawl prescribes for her.

## 2019-10-30 NOTE — Telephone Encounter (Signed)
Spoke with patient and informed her with what provider stated and she stated she will just wait until her f/u appt with provider.

## 2019-10-30 NOTE — Progress Notes (Signed)
Virtual Visit via Video Note  I connected with Ashley Franklin on 11/04/19 at 11:30 AM EDT by a video enabled telemedicine application and verified that I am speaking with the correct person using two identifiers.   I discussed the limitations of evaluation and management by telemedicine and the availability of in person appointments. The patient expressed understanding and agreed to proceed.     I discussed the assessment and treatment plan with the patient. The patient was provided an opportunity to ask questions and all were answered. The patient agreed with the plan and demonstrated an understanding of the instructions.   The patient was advised to call back or seek an in-person evaluation if the symptoms worsen or if the condition fails to improve as anticipated.  Location: patient- at work, provider- home office   I provided 15 minutes of non-face-to-face time during this encounter.   [name], MD    [facility] MD/PA/NP OP Progress Note  11/04/2019 11:57 AM Ashley Franklin  MRN:  [id number]  Chief Complaint:  Chief Complaint    Follow-up; Depression; Other; Anxiety     HPI:  This is a follow-up appointment for bipolar disorder and anxiety.  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  She is doing administrative type of work, and she likes her work.  Although she still has hypersomnia, it has been a little more manageable as she is able to sleep 11 hours every day. Although she needs to take a nap during the day due to hypersomnia, she denies significant issues at work.   She reports improvement in hand tremors, which she attributes to the stress she used to have.  She reports good relationship with her husband.  She enjoys doing games.  She has hypersomnia.  She denies feeling depressed. She has been eating healthier diet, and she lost 13 pounds this year.  She has good concentration.  She denies SI.  She denies panic attacks.  She denies decreased  need for sleep or euphoria. She denies increased goal directed activity.   She denies anxiety or irritability. She denies panic attacks. She has an upcoming appointment with her sleep specialist.  Although she has been unable to get blood test due to lack of insurance, she agrees to do so now that she has one.   Wt Readings from Last 3 Encounters:  09/01/19 243 lb (110.2 kg)  08/05/19 253 lb 15.5 oz (115.2 kg)  07/31/19 253 lb 9.6 oz (115 kg)    Visit Diagnosis:    ICD-10-CM   1. Bipolar disorder, in partial remission, most recent episode depressed (HCC)  F31.75 FLUoxetine (PROZAC) 20 MG tablet  2. Anxiety disorder, unspecified type  F41.9     Past Psychiatric History: Please see initial evaluation for full details. I have reviewed the history. No updates at this time.     Past Medical History:  Past Medical History:  Diagnosis Date  . Anemia   . Anxiety   . Bipolar affective disorder (HCC)   . Blood transfusion without reported diagnosis   . Bronchospasm with bronchitis, acute   . Depression   . Kidney stone   . Right carpal tunnel syndrome 12/09/2018  . SBO (small bowel obstruction) (HCC) 01/2014  . Sleep apnea     Past Surgical History:  Procedure Laterality Date  . CARPAL TUNNEL RELEASE Right 08/05/2019   Procedure: RIGHT CARPAL TUNNEL RELEASE;  Surgeon: Cindee Salt, MD;  Location: [facility];  Service: Orthopedics;  Laterality: Right;  IV REGIONAL FOREARM BLOCK  . CESAREAN SECTION    . ENDOMETRIAL ABLATION    . GASTRIC BYPASS  2001  . SBO  2015    Family Psychiatric History: Please see initial evaluation for full details. I have reviewed the history. No updates at this time.     Family History:  Family History  Problem Relation Age of Onset  . Diabetes Mother   . Cancer Mother 60       Throat   . Bipolar disorder Mother   . Bipolar disorder Sister   . Bipolar disorder Brother   . Drug abuse Brother   . Bipolar disorder Sister   . Colon cancer  Neg Hx   . Cancer - Prostate Neg Hx   . Breast cancer Neg Hx     Social History:  Social History   Socioeconomic History  . Marital status: Married    Spouse name: Not on file  . Number of children: 2  . Years of education: Not on file  . Highest education level: Not on file  Occupational History  . Occupation: phelbotomist    Employer: [employer]  Tobacco Use  . Smoking status: Never Smoker  . Smokeless tobacco: Never Used  . Tobacco comment: 1/2 ppd   Vaping Use  . Vaping Use: Never used  Substance and Sexual Activity  . Alcohol use: Yes    Alcohol/week: 0.0 standard drinks    Comment: Two times a week.   . Drug use: No  . Sexual activity: Yes    Partners: Male    Birth control/protection: Surgical  Other Topics Concern  . 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 Difficulty of Paying Living Expenses:   Food Insecurity:   . Worried About Programme researcher, broadcasting/film/video in the Last Year:   . Barista in the Last Year:   Transportation Needs:   . Freight forwarder (Medical):   Marland Kitchen Lack of Transportation (Non-Medical):   Physical Activity:   . Days of Exercise per Week:   . Minutes of Exercise per Session:   Stress:   . Feeling of Stress :   Social Connections:   . Frequency of Communication with Friends and Family:   . Frequency of Social Gatherings with Friends and Family:   . Attends Religious Services:   . Active Member of Clubs or Organizations:   . Attends Banker Meetings:   Marland Kitchen Marital Status:     Allergies:  Allergies  Allergen Reactions  . Nsaids  Other (See Comments)    Contraindicated with gastric bypass    Metabolic Disorder Labs: Lab Results  Component Value Date   HGBA1C 4.9 02/06/2017   MPG 105 02/26/2015   No results found for: PROLACTIN Lab Results  Component Value Date   CHOL 165 02/06/2017   TRIG 87 02/06/2017   HDL 54 02/06/2017   LDLCALC 94 02/06/2017   Lab Results  Component Value Date   TSH 1.12 07/10/2017   TSH 1.22 06/08/2016    Therapeutic Level Labs: No results found for: LITHIUM Lab Results  Component Value Date   VALPROATE 71 01/27/2019   VALPROATE 64 10/02/2018   No components found for:  CBMZ  Current Medications: Current Outpatient Medications  Medication Sig Dispense Refill  . [START ON 12/02/2019] divalproex (DEPAKOTE ER) 500 MG 24 hr tablet Take 4 tablets (2,000 mg total) by mouth at bedtime. 360 tablet 0  . [START ON 12/02/2019] FLUoxetine (PROZAC) 20 MG tablet Take 3 tablets (60 mg total) by mouth daily. 270 tablet 0  . [START ON 12/02/2019] lamoTRIgine (LAMICTAL) 100 MG tablet Take 1 tablet (100 mg total) by mouth daily. 90 tablet 0  . [START ON 12/02/2019] Lurasidone HCl 60 MG TABS Take 1 tablet (60 mg total) by mouth daily. 90 tablet 0   No current facility-administered medications for this visit.     Musculoskeletal: Strength & Muscle Tone: N/A Gait & Station: N/A Patient leans: N/A  Psychiatric Specialty Exam: Review of Systems  Psychiatric/Behavioral: Positive for sleep disturbance. Negative for agitation, behavioral problems, confusion, decreased concentration, dysphoric mood, hallucinations, self-injury and suicidal ideas. The patient is not nervous/anxious and is not hyperactive.   All other systems reviewed and are negative.   There were no vitals taken for this visit.There is no height or weight on file to calculate BMI.  General Appearance: Fairly Groomed  Eye Contact:  Good  Speech:  Clear and Coherent  Volume:  Normal  Mood:  good  Affect:  Appropriate,  Congruent and Full Range  Thought Process:  Coherent  Orientation:  Full (Time, Place, and Person)  Thought Content: Logical   Suicidal Thoughts:  No  Homicidal Thoughts:  No  Memory:  Immediate;   Good  Judgement:  Good  Insight:  Good  Psychomotor Activity:  Normal  Concentration:  Concentration: Good and Attention Span: Good  Recall:  Good  Fund of Knowledge: Good  Language: Good  Akathisia:  No  Handed:  Right  AIMS (if indicated): not done  Assets:  Communication Skills Desire for Improvement  ADL's:  Intact  Cognition: WNL  Sleep:  hypersomnia   Screenings: PHQ2-9     Office Visit from 07/31/2019 in [facility] Office Visit from 06/08/2016 in [facility] Office Visit from 03/15/2015 in [facility] Office Visit from 01/12/2015 in [facility]  PHQ-2 Total Score 0 0 0 0 0  PHQ-9 Total Score -- -- 2 -- --       Assessment and Plan:  DUANNA Franklin is a 48 y.o. year old female with a history of  bipolar disorder, anxiety, anemia, obesity s/p gastric bypass surgery, who presents for follow up appointment for below.    1. Bipolar disorder, in partial remission, most recent episode depressed (HCC) 2. Anxiety disorder, unspecified type Exam is notable for appropriately brighter affect, and she reports significant improvement in depressive symptoms and anxiety, which coincided with starting a new job.  Will continue current dose of Depakote to target bipolar disorder.  We will obtain labs for monitoring especially given its potential interaction with lamotrigine.  Will continue lamotrigine at the current dose to target bipolar disorder.  She is aware of its potential risk of Stevens-Johnson syndrome.  Will continue Latuda to target bipolar depression.  Discussed potential metabolic side  effect.  Will  continue fluoxetine to target depression and anxiety.  Noted that her hand tremors has improved since tapering down this medication.  We consider further tapering down of this medication if she denies any significant mood symptoms at the next visit.   # Hypersomnia She continues to have hypersomnia with fatigue.  She has an upcoming appointment with sleep specialist.    Plan: I have reviewed and updated plans as below 1.ContinueDepakote 2000 mg at night - check VPA (CBC, LFT reviewed wnl, 04/2019) 2. Continue fluoxetine 60 mg daily  3. Continuelamotrigine100mg  daily 4.Continuelatuda 60 mg daily 5. Next appointment: 9/14 at 10:40 for 30 mins, video  Past trials of medication: Lexapro, Paxil, amitriptyline, Abilify (irritable), oxcarbazepine, perphenazine, Trifluoperazine, Klonopin, Valium. Vistaril,Ambien   The patient demonstrates the following risk factors for suicide: Chronic risk factorsfor suicide include psychiatric disorder /bipolar disorder,previous self-harm of scratching herself. Acute risk factorsfor suicide includenone. Protective factorsfor this patient include positive social support, positive therapeutic relationship,hope for the future. Considering these factors, the overall suicide risk at this point appears to be low. Patient does have gun access at homeand agrees to lock guns. Discussed in detail safety plan that anytime having active suicidal thoughts or homicidal thoughts and she need to call 911 or go to local emergency room.  Norman Clay, MD 11/04/2019, 11:57 AM

## 2019-10-30 NOTE — Telephone Encounter (Signed)
All of her medication were ordered for 90 days in April. Will plan to order refills at the next visit. If she has run out her medication for some reason, please verify the orders with the pharmacy.

## 2019-11-04 ENCOUNTER — Other Ambulatory Visit: Payer: Self-pay

## 2019-11-04 ENCOUNTER — Encounter (HOSPITAL_COMMUNITY): Payer: Self-pay | Admitting: Psychiatry

## 2019-11-04 ENCOUNTER — Telehealth (INDEPENDENT_AMBULATORY_CARE_PROVIDER_SITE_OTHER): Payer: 59 | Admitting: Psychiatry

## 2019-11-04 DIAGNOSIS — F3175 Bipolar disorder, in partial remission, most recent episode depressed: Secondary | ICD-10-CM

## 2019-11-04 DIAGNOSIS — F419 Anxiety disorder, unspecified: Secondary | ICD-10-CM | POA: Diagnosis not present

## 2019-11-04 MED ORDER — LURASIDONE HCL 60 MG PO TABS: 60.0000 mg | ORAL_TABLET | Freq: Every day | ORAL | 0 refills | Status: DC

## 2019-11-04 MED ORDER — DIVALPROEX SODIUM ER 500 MG PO TB24: 2000.0000 mg | ORAL_TABLET | Freq: Every day | ORAL | 0 refills | Status: DC

## 2019-11-04 MED ORDER — FLUOXETINE HCL 20 MG PO TABS: 60.0000 mg | ORAL_TABLET | Freq: Every day | ORAL | 0 refills | Status: DC

## 2019-11-04 MED ORDER — LAMOTRIGINE 100 MG PO TABS: 100.0000 mg | ORAL_TABLET | Freq: Every day | ORAL | 0 refills | Status: DC

## 2019-11-04 MED FILL — LATUDA 60 MG TABLET: 60 | 30 days supply | Qty: 30 | Fill #0

## 2019-11-04 MED FILL — DIVALPROEX SOD ER 500 MG TA: 500 | 30 days supply | Qty: 120 | Fill #0

## 2019-11-04 MED FILL — FLUOXETINE HCL 20 MG TABS: 20 | 30 days supply | Qty: 90 | Fill #0

## 2019-11-04 MED FILL — LAMOTRIGINE 100 MG TABS: 100 | 30 days supply | Qty: 30 | Fill #0

## 2019-11-04 NOTE — Patient Instructions (Signed)
1.ContinueDepakote 2000 mg at night 2. Continue fluoxetine 60 mg daily  3. Continuelamotrigine100mg  daily 4.Continuelatuda 60 mg daily 5. Next appointment: 9/14 at 10:40

## 2019-12-08 ENCOUNTER — Ambulatory Visit: Payer: No Typology Code available for payment source | Admitting: Adult Health

## 2019-12-15 MED FILL — ARMODAFINIL 150 MG TABLET: 150 | 30 days supply | Qty: 30 | Fill #0

## 2019-12-16 MED FILL — LAMOTRIGINE 100 MG TABS: 100 | 30 days supply | Qty: 30 | Fill #1

## 2019-12-16 MED FILL — DIVALPROEX SOD ER 500 MG TA: 500 | 30 days supply | Qty: 120 | Fill #1

## 2019-12-16 MED FILL — LATUDA 60 MG TABLET: 60 | 30 days supply | Qty: 30 | Fill #1

## 2019-12-16 MED FILL — FLUOXETINE HCL 20 MG TABS: 20 | 30 days supply | Qty: 90 | Fill #1

## 2019-12-19 LAB — VALPROIC ACID LEVEL: Valproic Acid Lvl: 67 ug/mL (ref 50–100)

## 2019-12-22 ENCOUNTER — Encounter (HOSPITAL_COMMUNITY): Payer: Self-pay | Admitting: Psychiatry

## 2020-01-07 MED FILL — PENICILLIN VK 500 MG TABLET: 500 | 6 days supply | Qty: 24 | Fill #0

## 2020-01-07 MED FILL — IBUPROFEN 800 MG TAB: 800 | 5 days supply | Qty: 20 | Fill #0

## 2020-01-07 MED FILL — diazePAM 5 MG TABS: 5 | 4 days supply | Qty: 4 | Fill #0

## 2020-01-13 MED FILL — LAMOTRIGINE 100 MG TABS: 100 | 30 days supply | Qty: 30 | Fill #2

## 2020-01-13 MED FILL — FLUOXETINE HCL 20 MG TABS: 20 | 30 days supply | Qty: 90 | Fill #2

## 2020-01-13 MED FILL — ARMODAFINIL 150 MG TABLET: 150 | 30 days supply | Qty: 30 | Fill #1

## 2020-01-15 ENCOUNTER — Other Ambulatory Visit (HOSPITAL_BASED_OUTPATIENT_CLINIC_OR_DEPARTMENT_OTHER): Payer: Self-pay | Admitting: Neurology

## 2020-01-16 MED FILL — ARMODAFINIL 250 MG TABLET: 250 | 30 days supply | Qty: 30 | Fill #0

## 2020-01-20 ENCOUNTER — Ambulatory Visit: Payer: No Typology Code available for payment source | Admitting: Adult Health

## 2020-01-21 NOTE — Progress Notes (Signed)
Virtual Visit via Video Note  I connected with Ashley Franklin on 01/27/20 at 10:40 AM EDT by a video enabled telemedicine application and verified that I am speaking with the correct person using two identifiers.   I discussed the limitations of evaluation and management by telemedicine and the availability of in person appointments. The patient expressed understanding and agreed to proceed.   I discussed the assessment and treatment plan with the patient. The patient was provided an opportunity to ask questions and all were answered. The patient agreed with the plan and demonstrated an understanding of the instructions.   The patient was advised to call back or seek an in-person evaluation if the symptoms worsen or if the condition fails to improve as anticipated.  Location: patient- home, provider- home office   I provided 15 minutes of non-face-to-face time during this encounter.   Neysa Hottereina Theresia Pree, MD    [facility] MD/PA/NP OP Progress Note  01/27/2020 10:59 AM Ashley Franklin  MRN:  [id number]  Chief Complaint:  Chief Complaint    Follow-up; Other     HPI:  - She was started on Nuvigil for treatment of narcolepsy This is a follow-up appointment for bipolar disorder.  She states that she has been doing great.  She loves her work.  She likes people, and states that she feels peace there.  She reports great relationship with her husband and her children.  She is on Nuvigil, which was uptitrated to 250mg .  Although she still falls alseep during the day, it has become less, and she makes sure to have rest as she does not believe in herself. She agrees that she is more aware when she falls asleep.  She has a good faith in her provider about this condition.  She sleeps well at night.  She has good appetite.  She feels good about weight loss of 28 pounds since she eats healthier diet, and not eating snacks.  She has better concentration.  She denies feeling depressed.  She denies anxiety.  She  denies decreased need for sleep or euphoria.  She denies any concerns about her medication.   Daily routine: work Exercise: takes a walk Employment: work 8;30-5pm,  Household: husband  Marital status: married  Number of children: 2. her daughter obtained PHD in pharmacy, her son bought a house  Visit Diagnosis:    ICD-10-CM   1. Bipolar disorder, in full remission, most recent episode depressed (HCC)  F31.76 FLUoxetine (PROZAC) 20 MG tablet    Past Psychiatric History: Please see initial evaluation for full details. I have reviewed the history. No updates at this time.     Past Medical History:  Past Medical History:  Diagnosis Date  . Anemia   . Anxiety   . Bipolar affective disorder (HCC)   . Blood transfusion without reported diagnosis   . Bronchospasm with bronchitis, acute   . Depression   . Kidney stone   . Right carpal tunnel syndrome 12/09/2018  . SBO (small bowel obstruction) (HCC) 01/2014  . Sleep apnea     Past Surgical History:  Procedure Laterality Date  . CARPAL TUNNEL RELEASE Right 08/05/2019   Procedure: RIGHT CARPAL TUNNEL RELEASE;  Surgeon: Cindee SaltKuzma, Gary, MD;  Location:  SUR[facility]ERY [facility];  Service: Orthopedics;  Laterality: Right;  IV REGIONAL FOREARM BLOCK  . CESAREAN SECTION    . ENDOMETRIAL ABLATION    . GASTRIC BYPASS  2001  . SBO  2015    Family Psychiatric History: Please see  initial evaluation for full details. I have reviewed the history. No updates at this time.     Family History:  Family History  Problem Relation Age of Onset  . Diabetes Mother   . Cancer Mother 60       Throat   . Bipolar disorder Mother   . Bipolar disorder Sister   . Bipolar disorder Brother   . Drug abuse Brother   . Bipolar disorder Sister   . Colon cancer Neg Hx   . Cancer - Prostate Neg Hx   . Breast cancer Neg Hx     Social History:  Social History   Socioeconomic History  . Marital status: Married    Spouse name: Not on file  . Number of  children: 2  . Years of education: Not on file  . Highest education level: Not on file  Occupational History  . Occupation: phelbotomist    Employer: [employer]  Tobacco Use  . Smoking status: Never Smoker  . Smokeless tobacco: Never Used  . Tobacco comment: 1/2 ppd   Vaping Use  . Vaping Use: Never used  Substance and Sexual Activity  . Alcohol use: Yes    Alcohol/week: 0.0 standard drinks    Comment: Two times a week.   . Drug use: No  . Sexual activity: Yes    Partners: Male    Birth control/protection: Surgical  Other Topics Concern  . 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 Difficulty of Paying Living Expenses: Not on file  Food Insecurity:   . Worried About Programme researcher, broadcasting/film/video in the Last Year: Not on file  . Ran Out of Food in the Last Year: Not on file  Transportation Needs:   . Lack of Transportation (Medical): Not on file  . Lack of Transportation (Non-Medical): Not on file  Physical Activity:   . Days of Exercise per Week: Not on file  . Minutes of Exercise per Session: Not on file  Stress:   . Feeling of Stress : Not on file  Social Connections:   . Frequency of Communication with Friends and Family: Not on file  . Frequency of Social Gatherings with Friends and Family: Not on file  . Attends Religious Services: Not on file  . Active Member of Clubs or Organizations: Not on file  . Attends Banker Meetings: Not on file  . Marital Status: Not on file    Allergies:  Allergies  Allergen Reactions  . Nsaids Other (See Comments)    Contraindicated with  gastric bypass    Metabolic Disorder Labs: Lab Results  Component Value Date   HGBA1C 4.9 02/06/2017   MPG 105 02/26/2015   No results found for: PROLACTIN Lab Results  Component Value Date   CHOL 165 02/06/2017   TRIG 87 02/06/2017   HDL 54 02/06/2017   LDLCALC 94 02/06/2017   Lab Results  Component Value Date   TSH 1.12 07/10/2017   TSH 1.22 06/08/2016  Therapeutic Level Labs: No results found for: LITHIUM Lab Results  Component Value Date   VALPROATE 67 12/18/2019   VALPROATE 71 01/27/2019   No components found for:  CBMZ  Current Medications: Current Outpatient Medications  Medication Sig Dispense Refill  . [START ON 02/02/2020] divalproex (DEPAKOTE ER) 500 MG 24 hr tablet Take 4 tablets (2,000 mg total) by mouth at bedtime. 360 tablet 0  . [START ON 02/02/2020] FLUoxetine (PROZAC) 20 MG tablet Take 3 tablets (60 mg total) by mouth daily. 270 tablet 0  . [START ON 02/02/2020] lamoTRIgine (LAMICTAL) 100 MG tablet Take 1 tablet (100 mg total) by mouth daily. 90 tablet 0  . [START ON 02/02/2020] Lurasidone HCl 60 MG TABS Take 1 tablet (60 mg total) by mouth daily. 90 tablet 0   No current facility-administered medications for this visit.     Musculoskeletal: Strength & Muscle Tone: N/A Gait & Station: N/A Patient leans: N/A  Psychiatric Specialty Exam: Review of Systems  Psychiatric/Behavioral: Negative for agitation, behavioral problems, confusion, decreased concentration, dysphoric mood, hallucinations, self-injury, sleep disturbance and suicidal ideas. The patient is not nervous/anxious and is not hyperactive.   All other systems reviewed and are negative.   There were no vitals taken for this visit.There is no height or weight on file to calculate BMI.  General Appearance: Fairly Groomed  Eye Contact:  Good  Speech:  Clear and Coherent  Volume:  Normal  Mood:  great  Affect:  Appropriate, Congruent, Full Range and smiles  Thought Process:  Coherent   Orientation:  Full (Time, Place, and Person)  Thought Content: Logical   Suicidal Thoughts:  No  Homicidal Thoughts:  No  Memory:  Immediate;   Good  Judgement:  Good  Insight:  Good  Psychomotor Activity:  Normal  Concentration:  Concentration: Good and Attention Span: Good  Recall:  Good  Fund of Knowledge: Good  Language: Good  Akathisia:  No  Handed:  Right  AIMS (if indicated): not done  Assets:  Communication Skills Desire for Improvement  ADL's:  Intact  Cognition: WNL  Sleep:  Good   Screenings: PHQ2-9     Office Visit from 07/31/2019 in [facility] from 03/06/2017 in [facility] Office Visit from 06/08/2016 in [facility] Office Visit from 03/15/2015 in [facility] Office Visit from 01/12/2015 in [facility]  PHQ-2 Total Score 0 0 0 0 0  PHQ-9 Total Score -- -- 2 -- --       Assessment and Plan:  Ashley Franklin is a 49 y.o. year old female with a history of  bipolar disorder, anxiety, anemia,obesity s/p gastric bypass surgery, who presents for follow up appointment for below.   1. Bipolar disorder, in full remission, most recent episode depressed (HCC) Exam is notable for appropriately brighter affect, and there has been steady improvement in depressive symptoms and anxiety since the last visit.  Will continue current medication regimen.  Will continue Depakote to target bipolar disorder.  Will continue lamotrigine to target bipolar depression.  She is aware of its potential risk of Stevens-Johnson syndrome especially with concomitant use of Depakote.  Will continue fluoxetine to target depression.  Will consider tapering down this medication in the near future , if she denies any mood episode at the next visit.  We will continue Latuda to target bipolar depression.  She is aware of its potential metabolic side effect  and EPS.   # Narcolepsy She sees a specialist, and has been prescribed Provigil.  She is aware of its potential risk of medication induced mania.   Plan: I have reviewed and updated plans as below 1.ContinueDepakote 2000 mg at night -vpa checked 12/2018, (CBC, LFT reviewed wnl, 04/2019)- will check at the next visit 2.Continue fluoxetine 60 mg daily 3.Continuelamotrigine100mg  daily 4.Continuelatuda 60 mg daily 5. Next appointment: 12/14 at 9~10 for 20  mins, video  Past trials of medication: Lexapro, Paxil, amitriptyline, Abilify (irritable), oxcarbazepine, perphenazine, Trifluoperazine, Klonopin, Valium. Vistaril,Ambien   The patient demonstrates the following risk factors for suicide: Chronic risk factorsfor suicide include psychiatric disorder /bipolar disorder,previous self-harm of scratching herself. Acute risk factorsfor suicide includenone. Protective factorsfor this patient include positive social support, positive therapeutic relationship,hope for the future. Considering these factors, the overall suicide risk at this point appears to be low. Patient does have gun access at homeand agrees to lock guns. Discussed in detail safety plan that anytime having active suicidal thoughts or homicidal thoughts and she need to call 911 or go to local emergency room.  [name], MD 01/27/2020, 10:59 AM

## 2020-01-27 ENCOUNTER — Encounter (HOSPITAL_COMMUNITY): Payer: Self-pay | Admitting: Psychiatry

## 2020-01-27 ENCOUNTER — Other Ambulatory Visit: Payer: Self-pay

## 2020-01-27 ENCOUNTER — Other Ambulatory Visit (HOSPITAL_BASED_OUTPATIENT_CLINIC_OR_DEPARTMENT_OTHER): Payer: Self-pay | Admitting: Psychiatry

## 2020-01-27 ENCOUNTER — Telehealth (INDEPENDENT_AMBULATORY_CARE_PROVIDER_SITE_OTHER): Payer: 59 | Admitting: Psychiatry

## 2020-01-27 DIAGNOSIS — F3176 Bipolar disorder, in full remission, most recent episode depressed: Secondary | ICD-10-CM | POA: Diagnosis not present

## 2020-01-27 MED ORDER — FLUOXETINE HCL 20 MG PO TABS: 60.0000 mg | ORAL_TABLET | Freq: Every day | ORAL | 0 refills | Status: DC

## 2020-01-27 MED ORDER — DIVALPROEX SODIUM ER 500 MG PO TB24: 2000.0000 mg | ORAL_TABLET | Freq: Every day | ORAL | 0 refills | Status: DC

## 2020-01-27 MED ORDER — LAMOTRIGINE 100 MG PO TABS: 100.0000 mg | ORAL_TABLET | Freq: Every day | ORAL | 0 refills | Status: DC

## 2020-01-27 MED ORDER — LURASIDONE HCL 60 MG PO TABS: 60.0000 mg | ORAL_TABLET | Freq: Every day | ORAL | 0 refills | Status: DC

## 2020-01-27 MED FILL — DIVALPROEX SOD ER 500 MG TA: 500 | 30 days supply | Qty: 120 | Fill #0

## 2020-01-27 NOTE — Patient Instructions (Signed)
1.ContinueDepakote 2000 mg at night 2.Continue fluoxetine 60 mg daily 3.Continuelamotrigine100mg  daily 4.Continuelatuda 60 mg daily 5. Next appointment: 12/14 at 9:10

## 2020-02-09 ENCOUNTER — Encounter: Payer: Self-pay | Admitting: Family Medicine

## 2020-02-11 ENCOUNTER — Ambulatory Visit
Admission: RE | Admit: 2020-02-11 | Discharge: 2020-02-11 | Disposition: A | Discharge status: Home / Self Care | Payer: 59 | Source: Ambulatory Visit | Attending: Family Medicine | Admitting: Family Medicine

## 2020-02-11 ENCOUNTER — Ambulatory Visit (INDEPENDENT_AMBULATORY_CARE_PROVIDER_SITE_OTHER): Payer: 59 | Admitting: Family Medicine

## 2020-02-11 ENCOUNTER — Encounter: Payer: Self-pay | Admitting: Family Medicine

## 2020-02-11 ENCOUNTER — Other Ambulatory Visit: Payer: Self-pay

## 2020-02-11 VITALS — BP 120/80 | HR 78 | Wt 226.4 lb

## 2020-02-11 DIAGNOSIS — M79671 Pain in right foot: Secondary | ICD-10-CM

## 2020-02-11 DIAGNOSIS — R2689 Other abnormalities of gait and mobility: Secondary | ICD-10-CM | POA: Diagnosis not present

## 2020-02-11 NOTE — Progress Notes (Signed)
° °  Subjective:    Patient ID: Ashley Franklin, female    DOB: [birth year], 49 y.o.   MRN: [id number]  HPI Chief Complaint  Patient presents with   possible broken foot    happen in april getting out of shower. getting worse and numbness   Complains of right midfoot pain since April 2021.  States she was getting out of the shower and stepped on a metal shower rod that she was going to put up. States her foot recently started tingling and this is what made her decide to get it checked.  States she is not taking anything for pain.  She does elevate it when sitting.  No other arthralgias or myalgias.  No fever or chills.   Review of Systems Pertinent positives and negatives in the history of present illness.     Objective:   Physical Exam BP 120/80    Pulse 78    Wt 226 lb 6.4 oz (102.7 kg)    SpO2 98%    BMI 38.86 kg/m   Normal right ankle.  Right foot with normal sensation and motor function.  Marked tenderness to palpation to the right midfoot area.  Antalgic gait      Assessment & Plan:  Right foot pain - Plan: DG Foot Complete Right  Antalgic gait - Plan: DG Foot Complete Right  Several month history of right foot pain without improvement since injuring her foot.  She has not been seen for this.  I will send her for an x-ray and refer her to Ortho for further evaluation and treatment.

## 2020-02-13 NOTE — Progress Notes (Signed)
Please refer her to [facility] for persistent foot pain and calcaneal spur.

## 2020-02-16 ENCOUNTER — Other Ambulatory Visit: Payer: Self-pay | Admitting: Internal Medicine

## 2020-02-16 DIAGNOSIS — M7731 Calcaneal spur, right foot: Secondary | ICD-10-CM

## 2020-02-16 DIAGNOSIS — M79671 Pain in right foot: Secondary | ICD-10-CM

## 2020-02-19 ENCOUNTER — Encounter: Payer: Self-pay | Admitting: Orthopedic Surgery

## 2020-02-19 ENCOUNTER — Ambulatory Visit: Payer: 59 | Admitting: Orthopedic Surgery

## 2020-02-19 VITALS — Ht 64.0 in | Wt 226.0 lb

## 2020-02-19 DIAGNOSIS — M76821 Posterior tibial tendinitis, right leg: Secondary | ICD-10-CM

## 2020-02-19 NOTE — Progress Notes (Signed)
Office Visit Note   Patient: Ashley Franklin           Date of Birth: 1970/06/06           MRN: [id number] Visit Date: 02/19/2020              Requested by: Avanell Shackleton, NP-C [address] [place],  [place] [address] PCP: Avanell Shackleton, NP-C  Chief Complaint  Patient presents with  . Right Foot - Pain      HPI: Patient is a 49 year old woman who presents with several month history of pain at the insertion of the posterior tibial tendon right foot.  Patient has had radiographs obtained 1 week ago.  Patient states she did have a twisting injury to her foot several months ago.  Pain along the inferior course of the posterior tibial tendon she denies any swelling or bruising does have pain with weightbearing.  Assessment & Plan: Visit Diagnoses:  1. Posterior tibial tendinitis, right     Plan: Patient has inflammation at the insertion of the posterior tibial tendon we will place her in a posterior tibial tendon brace recommended Voltaren gel topically 3 times a day.  At follow-up we will reevaluate if she is not improving we may need to immobilizer and a cam walker.  Follow-Up Instructions: Return in about 4 weeks (around 03/18/2020).   Ortho Exam  Patient is alert, oriented, no adenopathy, well-dressed, normal affect, normal respiratory effort. Examination patient has a good dorsalis pedis and posterior tibial pulse she has good ankle good subtalar motion radiographs shows a long second third and fourth metatarsal but no bony abnormalities.  Patient has pain to palpation at the insertion of the posterior tibial tendon she cannot do a single limb heel raise on the right this reproduces her pain.  Patient can do a single limb heel raise on the left without problem.  Imaging: No results found. No images are attached to the encounter.  Labs: Lab Results  Component Value Date   HGBA1C 4.9 02/06/2017   HGBA1C 5.3 02/26/2015   REPTSTATUS 03/04/2015 FINAL 02/27/2015    CULT  02/27/2015    NO GROWTH 5 DAYS Performed at [facility]      Lab Results  Component Value Date   ALBUMIN 4.3 01/27/2019   ALBUMIN 4.2 10/02/2018   ALBUMIN 3.7 01/11/2018    No results found for: MG No results found for: VD25OH  No results found for: PREALBUMIN CBC EXTENDED Latest Ref Rng & Units 05/14/2019 01/27/2019 10/02/2018  WBC 3.8 - 10.8 Thousand/uL 7.4 5.4 7.2  RBC 3.80 - 5.10 Million/uL 3.92 4.22 4.40  HGB 11.7 - 15.5 g/dL 66.4 40.3 47.4  HCT 35 - 45 % 35.7 39.2 40.4  PLT 140 - 400 Thousand/uL 188 196 230  NEUTROABS 1,500 - 7,800 cells/uL 4,440 - -  LYMPHSABS 850 - 3,900 cells/uL 2,005 - -     Body mass index is 38.79 kg/m.  Orders:  No orders of the defined types were placed in this encounter.  No orders of the defined types were placed in this encounter.    Procedures: No procedures performed  Clinical Data: No additional findings.  ROS:  All other systems negative, except as noted in the HPI. Review of Systems  Objective: Vital Signs: Ht 5\' 4"  (1.626 m)   Wt 226 lb (102.5 kg)   BMI 38.79 kg/m   Specialty Comments:  No specialty comments available.  PMFS History: Patient Active Problem List  Diagnosis Date Noted  . Tremor of both hands 09/01/2019  . Bipolar disorder, in full remission, most recent episode depressed (HCC) 04/23/2019  . Right carpal tunnel syndrome 12/09/2018  . Closed nondisplaced fracture of fifth left metatarsal bone 05/10/2018  . History of shingles 01/08/2018  . Menopause 01/08/2018  . Left ankle injury, subsequent encounter 08/04/2017  . PCP NOTES >>>>>>>>>> 07/11/2017  . 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 Chronic knee pain 04/18/2015  . Plantar fasciitis 04/18/2015  . Tobacco use disorder 03/15/2015  . Morbid obesity (HCC) 01/05/2015  . Bipolar affective (HCC) 01/02/2015   Past Medical History:  Diagnosis Date  . Anemia   . Anxiety   . Bipolar affective disorder (HCC)   . Blood transfusion without  reported diagnosis   . Bronchospasm with bronchitis, acute   . Depression   . Kidney stone   . Right carpal tunnel syndrome 12/09/2018  . SBO (small bowel obstruction) (HCC) 01/2014  . Sleep apnea     Family History  Problem Relation Age of Onset  . Diabetes Mother   . Cancer Mother 60       Throat   . Bipolar disorder Mother   . Bipolar disorder Sister   . Bipolar disorder Brother   . Drug abuse Brother   . Bipolar disorder Sister   . Colon cancer Neg Hx   . Cancer - Prostate Neg Hx   . Breast cancer Neg Hx     Past Surgical History:  Procedure Laterality Date  . CARPAL TUNNEL RELEASE Right 08/05/2019   Procedure: RIGHT CARPAL TUNNEL RELEASE;  Surgeon: Cindee Salt, MD;  Location: [facility];  Service: Orthopedics;  Laterality: Right;  IV REGIONAL FOREARM BLOCK  . CESAREAN SECTION    . ENDOMETRIAL ABLATION    . GASTRIC BYPASS  2001  . SBO  2015   Social History   Occupational History  . Occupation: phelbotomist    Employer: [employer]  Tobacco Use  . Smoking status: Never Smoker  . Smokeless tobacco: Never Used  . Tobacco comment: 1/2 ppd   Vaping Use  . Vaping Use: Never used  Substance and Sexual Activity  . Alcohol use: Yes    Alcohol/week: 0.0 standard drinks    Comment: Two times a week.   . Drug use: No  . Sexual activity: Yes    Partners: Male    Birth control/protection: Surgical

## 2020-02-24 ENCOUNTER — Other Ambulatory Visit (HOSPITAL_BASED_OUTPATIENT_CLINIC_OR_DEPARTMENT_OTHER): Payer: Self-pay | Admitting: Family Medicine

## 2020-02-24 DIAGNOSIS — Z1231 Encounter for screening mammogram for malignant neoplasm of breast: Secondary | ICD-10-CM

## 2020-02-25 MED FILL — FLUOXETINE HCL 20 MG TABS: 20 | 30 days supply | Qty: 90 | Fill #0

## 2020-02-25 MED FILL — DIVALPROEX SOD ER 500 MG TA: 500 | 30 days supply | Qty: 120 | Fill #1

## 2020-02-25 MED FILL — ARMODAFINIL 250 MG TABLET: 250 | 30 days supply | Qty: 30 | Fill #1

## 2020-02-25 MED FILL — LAMOTRIGINE 100 MG TABS: 100 | 30 days supply | Qty: 30 | Fill #0

## 2020-02-25 MED FILL — LATUDA 60 MG TABLET: 60 | 30 days supply | Qty: 30 | Fill #0

## 2020-03-11 ENCOUNTER — Other Ambulatory Visit (HOSPITAL_BASED_OUTPATIENT_CLINIC_OR_DEPARTMENT_OTHER): Payer: Self-pay

## 2020-03-11 MED FILL — AZITHROMYCIN 500 MG TABS: 500 | 3 days supply | Qty: 3 | Fill #0

## 2020-03-17 ENCOUNTER — Other Ambulatory Visit (HOSPITAL_BASED_OUTPATIENT_CLINIC_OR_DEPARTMENT_OTHER): Payer: Self-pay | Admitting: Neurology

## 2020-03-17 MED FILL — ADDERALL XR 25 MG CAPSULE: 25 | 30 days supply | Qty: 30 | Fill #0

## 2020-03-18 ENCOUNTER — Ambulatory Visit: Payer: 59 | Admitting: Orthopedic Surgery

## 2020-03-23 ENCOUNTER — Encounter: Payer: Self-pay | Admitting: Orthopedic Surgery

## 2020-04-01 MED FILL — DIVALPROEX SOD ER 500 MG TA: 500 | 30 days supply | Qty: 120 | Fill #2

## 2020-04-01 MED FILL — FLUOXETINE HCL 20 MG TABS: 20 | 30 days supply | Qty: 90 | Fill #1

## 2020-04-01 MED FILL — ARMODAFINIL 250 MG TABLET: 250 | 30 days supply | Qty: 30 | Fill #2

## 2020-04-01 MED FILL — LATUDA 60 MG TABLET: 60 | 30 days supply | Qty: 30 | Fill #1

## 2020-04-01 MED FILL — LAMOTRIGINE 100 MG TABS: 100 | 30 days supply | Qty: 30 | Fill #1

## 2020-04-06 ENCOUNTER — Ambulatory Visit (HOSPITAL_BASED_OUTPATIENT_CLINIC_OR_DEPARTMENT_OTHER): Payer: 59

## 2020-04-20 NOTE — Progress Notes (Signed)
Virtual Visit via Video Note  I connected with Ashley Franklin on 04/27/20 at  9:00 AM EST by a video enabled telemedicine application and verified that I am speaking with the correct person using two identifiers.  Location: Patient: home Provider: office   I discussed the limitations of evaluation and management by telemedicine and the availability of in person appointments. The patient expressed understanding and agreed to proceed.   I discussed the assessment and treatment plan with the patient. The patient was provided an opportunity to ask questions and all were answered. The patient agreed with the plan and demonstrated an understanding of the instructions.   The patient was advised to call back or seek an in-person evaluation if the symptoms worsen or if the condition fails to improve as anticipated.  I provided 14 minutes of non-face-to-face time during this encounter.   Neysa Hottereina Cyndra Feinberg, MD    [facility] MD/PA/NP OP Progress Note  04/27/2020 9:18 AM Ashley OatsJennifer S Franklin  MRN:  [id number]  Chief Complaint:  Chief Complaint    Follow-up     HPI:  This is a follow-up appointment for bipolar disorder and anxiety.  She states that she has been doing very well.  She loves her work, and reports good support from her coworkers.  She had 3 Thanksgiving over 4 days.  She enjoyed meeting with her family.  She notices that she has been more anxious lately.  She is concerned about her husband, who is recently started on medication for depression and anxiety.  She tries not to depend on him. She is willing to see a therapist now that she has flexibility in her work schedule.  She sleeps well.  She has good energy and motivation.  She has good concentration.  She has reduced appetite and lost some weight.  She denies SI.  She feels anxious and tense.  She denies irritability.  She has occasional panic attacks.  She denies decreased need for sleep, euphoria or increased goal-directed activity. She is able  to stay awake since being on Adderall XR.  Daily routine: work Exercise: takes a walk Employment: work 8;30-5pm,  Household: husband  Marital status: married  Number of children: 2. her daughter obtained PHD in pharmacy, her son bought a house  Visit Diagnosis:    ICD-10-CM   1. Bipolar disorder, in full remission, most recent episode depressed (HCC)  F31.76   2. Anxiety state  F41.1     Past Psychiatric History: Please see initial evaluation for full details. I have reviewed the history. No updates at this time.     Past Medical History:  Past Medical History:  Diagnosis Date  . Anemia   . Anxiety   . Bipolar affective disorder (HCC)   . Blood transfusion without reported diagnosis   . Bronchospasm with bronchitis, acute   . Depression   . Kidney stone   . Right carpal tunnel syndrome 12/09/2018  . SBO (small bowel obstruction) (HCC) 01/2014  . Sleep apnea     Past Surgical History:  Procedure Laterality Date  . CARPAL TUNNEL RELEASE Right 08/05/2019   Procedure: RIGHT CARPAL TUNNEL RELEASE;  Surgeon: Cindee SaltKuzma, Gary, MD;  Location: [facility];  Service: Orthopedics;  Laterality: Right;  IV REGIONAL FOREARM BLOCK  . CESAREAN SECTION    . ENDOMETRIAL ABLATION    . GASTRIC BYPASS  2001  . SBO  2015    Family Psychiatric History: Please see initial evaluation for full details. I have reviewed the  history. No updates at this time.     Family History:  Family History  Problem Relation Age of Onset  . Diabetes Mother   . Cancer Mother 60       Throat   . Bipolar disorder Mother   . Bipolar disorder Sister   . Bipolar disorder Brother   . Drug abuse Brother   . Bipolar disorder Sister   . Colon cancer Neg Hx   . Cancer - Prostate Neg Hx   . Breast cancer Neg Hx     Social History:  Social History   Socioeconomic History  . Marital status: Married    Spouse name: Not on file  . Number of children: 2  . Years of education: Not on file  . Highest  education level: Not on file  Occupational History  . Occupation: phelbotomist    Employer:   Tobacco Use  . Smoking status: Never Smoker  . Smokeless tobacco: Never Used  . Tobacco comment: 1/2 ppd   Vaping Use  . Vaping Use: Never used  Substance and Sexual Activity  . Alcohol use: Yes    Alcohol/week: 0.0 standard drinks    Comment: Two times a week.   . Drug use: No  . Sexual activity: Yes    Partners: Male    Birth control/protection: Surgical  Other Topics Concern  . 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 Nsaids Other (See Comments)    Contraindicated with gastric bypass    Metabolic Disorder Labs: Lab Results  Component Value Date   HGBA1C 4.9 02/06/2017   MPG 105 02/26/2015   No results found for: PROLACTIN Lab Results  Component Value Date   CHOL 165 02/06/2017   TRIG 87 02/06/2017   HDL 54 02/06/2017   LDLCALC 94 02/06/2017   Lab Results  Component Value Date   TSH 1.12 07/10/2017   TSH 1.22 06/08/2016    Therapeutic Level Labs: No results found for: LITHIUM Lab Results  Component Value Date   VALPROATE 67 12/18/2019   VALPROATE 71 01/27/2019   No components found for:  CBMZ  Current Medications: Current Outpatient Medications  Medication Sig Dispense Refill  . divalproex (DEPAKOTE ER) 500 MG 24 hr tablet Take 4 tablets (2,000 mg  total) by mouth at bedtime. 360 tablet 0  . FLUoxetine (PROZAC) 20 MG tablet Take 3 tablets (60 mg total) by mouth daily. 270 tablet 0  . lamoTRIgine (LAMICTAL) 100 MG tablet Take 1 tablet (100 mg total) by mouth daily. 90 tablet 0  . Lurasidone HCl 60 MG TABS Take 1 tablet (60 mg total) by mouth daily. 90 tablet 0   No current facility-administered medications for this visit.     Musculoskeletal: Strength & Muscle Tone: N/A  Gait & Station: N/A Patient leans: N/A  Psychiatric Specialty Exam: Review of Systems  Psychiatric/Behavioral: Negative for agitation, behavioral problems, confusion, decreased concentration, dysphoric mood, hallucinations, self-injury, sleep disturbance and suicidal ideas. The patient is nervous/anxious. The patient is not hyperactive.   All other systems reviewed and are negative.   There were no vitals taken for this visit.There is no height or weight on file to calculate BMI.  General Appearance: Fairly Groomed  Eye Contact:  Good  Speech:  Clear and Coherent  Volume:  Normal  Mood:  good  Affect:  Appropriate, Congruent and euthymic  Thought Process:  Coherent  Orientation:  Full (Time, Place, and Person)  Thought Content: Logical   Suicidal Thoughts:  No  Homicidal Thoughts:  No  Memory:  Immediate;   Good  Judgement:  Good  Insight:  Good  Psychomotor Activity:  Normal  Concentration:  Concentration: Good and Attention Span: Good  Recall:  Good  Fund of Knowledge: Good  Language: Good  Akathisia:  No  Handed:  Right  AIMS (if indicated): not done  Assets:  Communication Skills Desire for Improvement  ADL's:  Intact  Cognition: WNL  Sleep:  Good   Screenings: PHQ2-9   Flowsheet Row Office Visit from 07/31/2019 in [facility] from [facility]/06/2017 [facility] Office Visit from 06/08/2016 in [facility] Office Visit from 03/15/2015 in [facility] Office Visit from 01/12/2015 in [facility]  PHQ-2 Total Score 0 0 0 0 0  PHQ-9 Total Score -- -- 2 -- --       Assessment and Plan:  Ashley Franklin is a 49 y.o. year old female with a history of bipolar disorder, anxiety, anemia,obesity s/p gastric bypass surgery, who presents for follow up appointment for below.   1. Bipolar disorder, in full remission, most recent episode depressed (HCC) 2. Anxiety state She reports worsening in anxiety in the context of holiday and her husband suffering from depression and anxiety. Will continue current medication regimen at this time with the hope that her anxiety improves as she engages in therapy. Will continue Depakote for bipolar disorder.  Will continue duloxetine to target anxiety.  We will continue lamotrigine for bipolar disorder.  She is aware of its potential risk of Stevens-Johnson syndrome.  We will continue Latuda for bipolar depression.  She is aware of its potential metabolic side effect and EPS.  She will greatly benefit from CBT; will make referral.   # Narcolepsy She sees a specialist. Currently on adderall.  She is advised to discuss with her provider regarding its potential side effect of appetite loss.  She is also aware of its potential risk of medication induced mania.   Plan: I have reviewed and updated plans as below 1.ContinueDepakote 2000 mg at night -vpa checked 12/2018, (CBC, LFT reviewed wnl, 04/2019)- will check at the next visit 2.Continuefluoxetine 60 mg daily 3.Continuelamotrigine100mg  daily 4.Continuelatuda 60 mg daily 5. Next appointment: 1/25 at 9:30 for 30  mins, video  [email or web address] - referral to therapy in [place] - on Adderall XR 25 mg daily for narcolepsy  Past trials of medication: Lexapro, Paxil, amitriptyline, Abilify (irritable), oxcarbazepine, perphenazine, Trifluoperazine, Klonopin, Valium.  Vistaril,Ambien   The patient demonstrates the following risk factors for suicide: Chronic risk factorsfor suicide include psychiatric disorder /bipolar disorder,previous self-harm of scratching herself. Acute risk factorsfor suicide includenone. Protective factorsfor this patient include positive social support,  positive therapeutic relationship,hope for the future. Considering these factors, the overall suicide risk at this point appears to be low. Patient does have gun access at homeand agrees to lock guns. Discussed in detail safety plan that anytime having active suicidal thoughts or homicidal thoughts and she need to call 911 or go to local emergency room.  [name], MD 04/27/2020, 9:18 AM

## 2020-04-23 ENCOUNTER — Ambulatory Visit
Admission: RE | Admit: 2020-04-23 | Discharge: 2020-04-23 | Disposition: A | Discharge status: Home / Self Care | Payer: 59 | Source: Ambulatory Visit | Attending: Family Medicine | Admitting: Family Medicine

## 2020-04-23 ENCOUNTER — Other Ambulatory Visit: Payer: Self-pay

## 2020-04-23 DIAGNOSIS — Z1231 Encounter for screening mammogram for malignant neoplasm of breast: Secondary | ICD-10-CM

## 2020-04-27 ENCOUNTER — Telehealth (INDEPENDENT_AMBULATORY_CARE_PROVIDER_SITE_OTHER): Payer: 59 | Admitting: Psychiatry

## 2020-04-27 ENCOUNTER — Encounter: Payer: Self-pay | Admitting: Psychiatry

## 2020-04-27 ENCOUNTER — Other Ambulatory Visit (HOSPITAL_BASED_OUTPATIENT_CLINIC_OR_DEPARTMENT_OTHER): Payer: Self-pay | Admitting: Psychiatry

## 2020-04-27 ENCOUNTER — Telehealth (HOSPITAL_COMMUNITY): Payer: 59 | Admitting: Psychiatry

## 2020-04-27 ENCOUNTER — Other Ambulatory Visit: Payer: Self-pay

## 2020-04-27 DIAGNOSIS — F3176 Bipolar disorder, in full remission, most recent episode depressed: Secondary | ICD-10-CM

## 2020-04-27 DIAGNOSIS — F411 Generalized anxiety disorder: Secondary | ICD-10-CM | POA: Diagnosis not present

## 2020-04-27 MED ORDER — LURASIDONE HCL 60 MG PO TABS: 60.0000 mg | ORAL_TABLET | Freq: Every day | ORAL | 0 refills | Status: DC

## 2020-04-27 MED ORDER — DIVALPROEX SODIUM ER 500 MG PO TB24: 2000.0000 mg | ORAL_TABLET | Freq: Every day | ORAL | 0 refills | Status: DC

## 2020-04-27 MED ORDER — LAMOTRIGINE 100 MG PO TABS: 100.0000 mg | ORAL_TABLET | Freq: Every day | ORAL | 0 refills | Status: DC

## 2020-04-27 MED ORDER — FLUOXETINE HCL 20 MG PO TABS: 60.0000 mg | ORAL_TABLET | Freq: Every day | ORAL | 0 refills | Status: DC

## 2020-04-27 MED FILL — LAMOTRIGINE 100 MG TABS: 100 | 30 days supply | Qty: 30 | Fill #0

## 2020-04-27 MED FILL — LATUDA 60 MG TABLET: 60 | 30 days supply | Qty: 30 | Fill #0

## 2020-04-27 MED FILL — FLUOXETINE HCL 20 MG TABS: 20 | 30 days supply | Qty: 90 | Fill #0

## 2020-04-27 MED FILL — DIVALPROEX SOD ER 500 MG TA: 500 | 30 days supply | Qty: 120 | Fill #0

## 2020-04-27 NOTE — Patient Instructions (Signed)
1.ContinueDepakote 2000 mg at night 2.Continuefluoxetine 60 mg daily 3.Continuelamotrigine100mg  daily 4.Continuelatuda 60 mg daily 5. Next appointment: 1/25 at 9:30

## 2020-04-28 NOTE — Progress Notes (Signed)
We are having technical issues that will hopefully be resolved soon. As soon as hear from IT that it is ok to release the images to the Radiologist we will. B'Nai RT(R)(M) [phone]

## 2020-04-29 ENCOUNTER — Other Ambulatory Visit (HOSPITAL_BASED_OUTPATIENT_CLINIC_OR_DEPARTMENT_OTHER): Payer: Self-pay | Admitting: Neurology

## 2020-04-29 MED FILL — ADDERALL XR 25 MG CAPSULE: 25 | 30 days supply | Qty: 30 | Fill #0

## 2020-06-07 NOTE — Progress Notes (Unsigned)
Virtual Visit via Video Note  I connected with [name] on 06/08/20 at  9:30 AM EST by a video enabled telemedicine application and verified that I am speaking with the correct person using two identifiers.  Location: Patient: home Provider: office Persons participated in the visit- patient, provider   I discussed the limitations of evaluation and management by telemedicine and the availability of in person appointments. The patient expressed understanding and agreed to proceed.   I discussed the assessment and treatment plan with the patient. The patient was provided an opportunity to ask questions and all were answered. The patient agreed with the plan and demonstrated an understanding of the instructions.   The patient was advised to call back or seek an in-person evaluation if the symptoms worsen or if the condition fails to improve as anticipated.  I provided 15 minutes of non-face-to-face time during this encounter.   [name], MD    [facility] MD/PA/NP OP Progress Note  06/08/2020 10:03 AM BENICIA BERGEVIN  MRN:  [id number]  Chief Complaint:  Chief Complaint    Follow-up; Other     HPI:  This is a follow-up appointment for bipolar disorder and anxiety.  She states that she has been doing great.  She had a blast on the holidays.  She was able to meet with her family, which includes her sister, and niece who she had not been able to meet for many years.  She reports great relationship with her husband.  She enjoys work.  Although she feels stressed and anxious at work at times, she has been able to manage things well.  She feels proud that she has not done any impulsive shopping, and has been able to chill out.  She denies any concerns at this time, and feels comfortable with her medication.  She sleeps at least 9 hours.  She takes a nap during lunch time; she has an upcoming appointment with her provider in a few months.  She has decreased appetite.  She occasionally does not  eat anything, although she agrees to keep healthy meals regularly.  She has good concentration.  She denies SI.  She denies decreased need for sleep or euphoria.  She feels less anxious.  She denies irritability.  She denies panic attacks.   Daily routine:work Exercise:takes a walk Employment:work 8;30-5pm, Household:husband Marital status:married Number of children:2. her daughter obtained PHD in pharmacy, her son bought a house  221 lbs Wt Readings from Last 3 Encounters:  02/19/20 226 lb (102.5 kg)  02/11/20 226 lb 6.4 oz (102.7 kg)  09/01/19 243 lb (110.2 kg)    Visit Diagnosis:    ICD-10-CM   1. Bipolar disorder, in full remission, most recent episode depressed (HCC)  F31.76 Valproic acid level    CBC    Comprehensive metabolic panel    FLUoxetine (PROZAC) 20 MG tablet  2. Anxiety state  F41.1     Past Psychiatric History: Please see initial evaluation for full details. I have reviewed the history. No updates at this time.     Past Medical History:  Past Medical History:  Diagnosis Date  . Anemia   . Anxiety   . Bipolar affective disorder (HCC)   . Blood transfusion without reported diagnosis   . Bronchospasm with bronchitis, acute   . Depression   . Kidney stone   . Right carpal tunnel syndrome 12/09/2018  . SBO (small bowel obstruction) (HCC) 01/2014  . Sleep apnea     Past Surgical  History:  Procedure Laterality Date  . CARPAL TUNNEL RELEASE Right 08/05/2019   Procedure: RIGHT CARPAL TUNNEL RELEASE;  Surgeon: Cindee Salt, MD;  Location: [facility];  Service: Orthopedics;  Laterality: Right;  IV REGIONAL FOREARM BLOCK  . CESAREAN SECTION    . ENDOMETRIAL ABLATION    . GASTRIC BYPASS  2001  . SBO  2015    Family Psychiatric History: Please see initial evaluation for full details. I have reviewed the history. No updates at this time.     Family History:  Family History  Problem Relation Age of Onset  . Diabetes Mother   . Cancer  Mother 60       Throat   . Bipolar disorder Mother   . Bipolar disorder Sister   . Bipolar disorder Brother   . Drug abuse Brother   . Bipolar disorder Sister   . Colon cancer Neg Hx   . Cancer - Prostate Neg Hx   . Breast cancer Neg Hx     Social History:  Social History   Socioeconomic History  . Marital status: Married    Spouse name: Not on file  . Number of children: 2  . Years of education: Not on file  . Highest education level: Not on file  Occupational History  . Occupation: phelbotomist    Employer: [employer]  Tobacco Use  . Smoking status: Never Smoker  . Smokeless tobacco: Never Used  . Tobacco comment: 1/2 ppd   Vaping Use  . Vaping Use: Never used  Substance and Sexual Activity  . Alcohol use: Yes    Alcohol/week: 0.0 standard drinks    Comment: Two times a week.   . Drug use: No  . Sexual activity: Yes    Partners: Male    Birth control/protection: Surgical  Other Topics Concern  . 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 Nsaids Other (See Comments)    Contraindicated with gastric bypass    Metabolic Disorder Labs: Lab Results  Component Value Date   HGBA1C 4.9 02/06/2017   MPG 105 02/26/2015   No results found for: PROLACTIN Lab Results  Component Value Date   CHOL 165 02/06/2017   TRIG 87 02/06/2017   HDL 54 02/06/2017   LDLCALC 94  02/06/2017   Lab Results  Component Value Date   TSH 1.12 07/10/2017   TSH 1.22 06/08/2016    Therapeutic Level Labs: No results found for: LITHIUM Lab Results  Component Value Date   VALPROATE 67 12/18/2019   VALPROATE 71 01/27/2019   No components found for:  CBMZ  Current Medications: Current Outpatient Medications  Medication Sig Dispense Refill  . amphetamine-dextroamphetamine (ADDERALL XR) 25 MG 24 hr capsule Take 25 mg by mouth every morning.    . [  START ON 07/26/2020] divalproex (DEPAKOTE ER) 500 MG 24 hr tablet Take 4 tablets (2,000 mg total) by mouth at bedtime. 360 tablet 1  . [START ON 07/26/2020] FLUoxetine (PROZAC) 20 MG tablet Take 3 tablets (60 mg total) by mouth daily. 270 tablet 1  . [START ON 07/26/2020] lamoTRIgine (LAMICTAL) 100 MG tablet Take 1 tablet (100 mg total) by mouth daily. 90 tablet 1  . [START ON 07/26/2020] Lurasidone HCl 60 MG TABS Take 1 tablet (60 mg total) by mouth daily. 90 tablet 1   No current facility-administered medications for this visit.     Musculoskeletal: Strength & Muscle Tone: N/A Gait & Station: N/A Patient leans: N/A  Psychiatric Specialty Exam: Review of Systems  Psychiatric/Behavioral: Negative for agitation, behavioral problems, confusion, decreased concentration, dysphoric mood, hallucinations, self-injury, sleep disturbance and suicidal ideas. The patient is nervous/anxious. The patient is not hyperactive.   All other systems reviewed and are negative.   There were no vitals taken for this visit.There is no height or weight on file to calculate BMI.  General Appearance: Fairly Groomed  Eye Contact:  Good  Speech:  Clear and Coherent  Volume:  Normal  Mood:  good  Affect:  Appropriate, Congruent and euthymic, appropriately bright  Thought Process:  Coherent  Orientation:  Full (Time, Place, and Person)  Thought Content: Logical   Suicidal Thoughts:  No  Homicidal Thoughts:  No  Memory:  Immediate;   Good   Judgement:  Good  Insight:  Good  Psychomotor Activity:  Normal  Concentration:  Concentration: Good and Attention Span: Good  Recall:  Good  Fund of Knowledge: Good  Language: Good  Akathisia:  No  Handed:  Right  AIMS (if indicated): not done  Assets:  Communication Skills Desire for Improvement  ADL's:  Intact  Cognition: WNL  Sleep:  Good   Screenings: PHQ2-9   Flowsheet Row Office Visit from 07/31/2019 in [facility] Office Visit from 06/08/2016 in [facility] Office Visit from 03/15/2015 in [facility] Office Visit from 01/12/2015 in [facility]  PHQ-2 Total Score 0 0 0 0 0  PHQ-9 Total Score - - 2 - -       Assessment and Plan:  NIAJA STICKLEY is a 50 y.o. year old female with a history of  bipolar disorder, anxiety, anemia,obesity s/p gastric bypass surgery, who presents for follow up appointment for below.   1. Bipolar disorder, in full remission, most recent episode depressed (HCC) 2. Anxiety state She denies significant mood symptoms since the last visit.  We will continue current medication regimen.  Will continue Depakote for bipolar disorder.  Will obtain labs for monitoring.  Will continue duloxetine for depression and anxiety.  Will continue lamotrigine for bipolar disorder.  Discussed potential risk of Stevens-Johnson syndrome.  Will continue Latuda for bipolar depression.  Discussed potential metabolic side effect and EPS.   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 Currently on adderall.  She is advised to discuss with her provider regarding its potential side effect of appetite loss.  She is also aware of its potential risk of medication induced mania.   Plan: I have reviewed and updated plans as below 1.ContinueDepakote 2000 mg at night 2. Check CBC, LFT, VPA  level  3.Continuefluoxetine 60 mg daily 4.Continuelamotrigine100mg  daily 5.Continuelatuda 60 mg daily 6. Next appointment:4/19 at 11 AM for ,  video  [email or web address] - referral to therapy in [place] - on Adderall XR 25 mg daily for narcolepsy  Past trials of medication: Lexapro, Paxil, amitriptyline, Abilify (irritable), oxcarbazepine, perphenazine, Trifluoperazine, Klonopin, Valium. Vistaril,Ambien   The patient demonstrates the following risk factors for suicide: Chronic risk factorsfor suicide include psychiatric disorder /bipolar disorder,previous self-harm of scratching herself. Acute risk factorsfor suicide includenone. Protective factorsfor this patient include positive social support, positive therapeutic relationship,hope for the future. Considering these factors, the overall suicide risk at this point appears to be low. Patient does have gun access at homeand agrees to lock guns. Discussed in detail safety plan that anytime having active suicidal thoughts or homicidal thoughts and she need to call 911 or go to local emergency room.  Neysa Hottereina Dastan Krider, MD 06/08/2020, 10:03 AM

## 2020-06-08 ENCOUNTER — Other Ambulatory Visit: Payer: Self-pay

## 2020-06-08 ENCOUNTER — Telehealth (INDEPENDENT_AMBULATORY_CARE_PROVIDER_SITE_OTHER): Payer: 59 | Admitting: Psychiatry

## 2020-06-08 ENCOUNTER — Other Ambulatory Visit (HOSPITAL_BASED_OUTPATIENT_CLINIC_OR_DEPARTMENT_OTHER): Payer: Self-pay | Admitting: Psychiatry

## 2020-06-08 ENCOUNTER — Encounter: Payer: Self-pay | Admitting: Psychiatry

## 2020-06-08 DIAGNOSIS — F411 Generalized anxiety disorder: Secondary | ICD-10-CM

## 2020-06-08 DIAGNOSIS — F3176 Bipolar disorder, in full remission, most recent episode depressed: Secondary | ICD-10-CM | POA: Diagnosis not present

## 2020-06-08 MED ORDER — FLUOXETINE HCL 20 MG PO TABS: 60.0000 mg | ORAL_TABLET | Freq: Every day | ORAL | 1 refills | Status: DC

## 2020-06-08 MED ORDER — LURASIDONE HCL 60 MG PO TABS: 60.0000 mg | ORAL_TABLET | Freq: Every day | ORAL | 1 refills | Status: DC

## 2020-06-08 MED ORDER — DIVALPROEX SODIUM ER 500 MG PO TB24: 2000.0000 mg | ORAL_TABLET | Freq: Every day | ORAL | 1 refills | Status: DC

## 2020-06-08 MED ORDER — LAMOTRIGINE 100 MG PO TABS: 100.0000 mg | ORAL_TABLET | Freq: Every day | ORAL | 1 refills | Status: DC

## 2020-06-08 NOTE — Patient Instructions (Signed)
1.ContinueDepakote 2000 mg at night 2. Check blood test CBC, LFT, VPA level  3.Continuefluoxetine 60 mg daily 4.Continuelamotrigine100mg  daily 5.Continuelatuda 60 mg daily 6. Next appointment:4/19 at 11 AM

## 2020-06-21 ENCOUNTER — Other Ambulatory Visit (HOSPITAL_BASED_OUTPATIENT_CLINIC_OR_DEPARTMENT_OTHER): Payer: Self-pay | Admitting: Physician Assistant

## 2020-06-21 ENCOUNTER — Telehealth: Payer: 59 | Admitting: Physician Assistant

## 2020-06-21 DIAGNOSIS — B002 Herpesviral gingivostomatitis and pharyngotonsillitis: Secondary | ICD-10-CM | POA: Diagnosis not present

## 2020-06-21 MED ORDER — VALACYCLOVIR HCL 1 G PO TABS: 2000.0000 mg | ORAL_TABLET | Freq: Two times a day (BID) | ORAL | 0 refills | Status: DC

## 2020-06-21 MED FILL — valACYclovir HCL 1 GM TABS: 1 | 1 days supply | Qty: 4 | Fill #0

## 2020-06-21 MED FILL — ARMODAFINIL 250 MG TABLET: 250 | 30 days supply | Qty: 30 | Fill #3

## 2020-06-21 NOTE — Progress Notes (Signed)
I have spent 5 minutes in review of e-visit questionnaire, review and updating patient chart, medical decision making and response to patient.   Mazin Emma Cody Chantrice Hagg, PA-C    

## 2020-06-21 NOTE — Progress Notes (Signed)
We are sorry that you are not feeling well.  Here is how we plan to help! ° °Based on what you have shared with me it does look like you have a viral infection.   ° °Most cold sores or fever blisters are small fluid filled blisters around the mouth caused by herpes simplex virus.  The most common strain of the virus causing cold sores is herpes simplex virus 1.  It can be spread by skin contact, sharing eating utensils, or even sharing towels.  Cold sores are contagious to other people until dry. (Approximately 5-7 days).  Wash your hands. You can spread the virus to your eyes through handling your contact lenses after touching the lesions. ° °Most people experience pain at the sight or tingling sensations in their lips that may begin before the ulcers erupt. ° °Herpes simplex is treatable but not curable.  It may lie dormant for a long time and then reappear due to stress or prolonged sun exposure.  Many patients have success in treating their cold sores with an over the counter topical called Abreva.  You may apply the cream up to 5 times daily (maximum 10 days) until healing occurs. ° °If you would like to use an oral antiviral medication to speed the healing of your cold sore, I have sent a prescription to your local pharmacy Valacyclovir 2 gm take one by mouth twice a day for 1 day   ° °HOME CARE: ° °Wash your hands frequently. °Do not pick at or rub the sore. °Don't open the blisters. °Avoid kissing other people during this time. °Avoid sharing drinking glasses, eating utensils, or razors. °Do not handle contact lenses unless you have thoroughly washed your hands with soap and warm water! °Avoid oral sex during this time.  Herpes from sores on your mouth can spread to your partner's genital area. °Avoid contact with anyone who has eczema or a weakened immune system. °Cold sores are often triggered by exposure to intense sunlight, use a lip balm containing a sunscreen (SPF 30 or higher). ° °GET HELP RIGHT AWAY  IF: ° °Blisters look infected. °Blisters occur near or in the eye. °Symptoms last longer than 10 days. °Your symptoms become worse. ° °MAKE SURE YOU: ° °Understand these instructions. °Will watch your condition. °Will get help right away if you are not doing well or get worse. ° °  °Your e-visit answers were reviewed by a board certified advanced clinical practitioner to complete your personal care plan.  Depending upon the condition, your plan could have  Included both over the counter or prescription medications.   ° °Please review your pharmacy choice.  Be sure that the pharmacy you have chosen is open so that you can pick up your prescription now.  If there is a problem you can message your provider in MyChart to have the prescription routed to another pharmacy.   ° °Your safety is important to us.  If you have drug allergies check our prescription carefully. ° °For the next 24 hours you can use MyChart to ask questions about today's visit, request a non-urgent call back, or ask for a work or school excuse from your e-visit provider. ° °You will get an email in the next two days asking about your experience.  I hope that your e-visit has been valuable and will speed your recovery. ° °

## 2020-06-22 ENCOUNTER — Other Ambulatory Visit (HOSPITAL_BASED_OUTPATIENT_CLINIC_OR_DEPARTMENT_OTHER): Payer: Self-pay | Admitting: Neurology

## 2020-06-22 MED FILL — ADDERALL XR 25 MG CAPSULE: 25 | 30 days supply | Qty: 30 | Fill #0

## 2020-07-07 MED FILL — FLUOXETINE HCL 20 MG TABS: 20 | 30 days supply | Qty: 90 | Fill #1

## 2020-07-07 MED FILL — LAMOTRIGINE 100 MG TABS: 100 | 30 days supply | Qty: 30 | Fill #1

## 2020-07-27 ENCOUNTER — Other Ambulatory Visit (HOSPITAL_BASED_OUTPATIENT_CLINIC_OR_DEPARTMENT_OTHER): Payer: Self-pay | Admitting: Neurology

## 2020-08-17 ENCOUNTER — Other Ambulatory Visit (HOSPITAL_BASED_OUTPATIENT_CLINIC_OR_DEPARTMENT_OTHER): Payer: Self-pay

## 2020-08-17 MED FILL — Fluoxetine HCl Tab 20 MG: ORAL | 30 days supply | Qty: 90 | Fill #0 | Status: AC

## 2020-08-17 MED FILL — Divalproex Sodium Tab ER 24 HR 500 MG: ORAL | 30 days supply | Qty: 120 | Fill #0 | Status: AC

## 2020-08-25 NOTE — Progress Notes (Signed)
Virtual Visit via Video Note  I connected with Ashley Franklin on 08/31/20 at 11:00 AM EDT by a video enabled telemedicine application and verified that I am speaking with the correct person using two identifiers.  Location: Patient: work Provider: office   I discussed the limitations of evaluation and management by telemedicine and the availability of in person appointments. The patient expressed understanding and agreed to proceed.     I discussed the assessment and treatment plan with the patient. The patient was provided an opportunity to ask questions and all were answered. The patient agreed with the plan and demonstrated an understanding of the instructions.   The patient was advised to call back or seek an in-person evaluation if the symptoms worsen or if the condition fails to improve as anticipated.  I provided 17 minutes of non-face-to-face time during this encounter.   [name], MD     [facility] MD/PA/NP OP Progress Note  08/31/2020 11:29 AM Ashley Franklin  MRN:  [id number]  Chief Complaint:  Chief Complaint    Follow-up; Other; Anxiety     HPI:  This is a follow-up appointment for bipolar disorder and anxiety.  She states that she has been doing great.  She states that the work is great.  She is also planning to go to [employer] to obtain bachelor's degree for business associates.  She feels excited about this.  Her husband has been doing better after adjusting his medication.  Her children have been doing good as well.  She may have spinal tap for further evaluation of narcolepsy.  The current combination of medication does not work well.  She slept 18 hours on weekends when she did not take medication for narcolepsy.  However, she denies any mood symptoms.  She denies feeling depressed or anxiety.  She denies decreased need for sleep, euphonia.  She denies panic attacks.  She denies any tremors.  She feels comfortable to stay on her medication.   Daily  routine:work Exercise:takes a walk Employment:work 8;30-5pm, Household:husband Marital status:married Number of children:2. her daughter obtained PHD in pharmacy, her son bought a house  206 lbs Wt Readings from Last 3 Encounters:  02/19/20 226 lb (102.5 kg)  02/11/20 226 lb 6.4 oz (102.7 kg)  09/01/19 243 lb (110.2 kg)    Visit Diagnosis:    ICD-10-CM   1. Bipolar disorder, in full remission, most recent episode depressed (HCC)  F31.76 CBC    Comprehensive metabolic panel    Valproic acid level  2. Anxiety state  F41.1     Past Psychiatric History: Please see initial evaluation for full details. I have reviewed the history. No updates at this time.     Past Medical History:  Past Medical History:  Diagnosis Date  . Anemia   . Anxiety   . Bipolar affective disorder (HCC)   . Blood transfusion without reported diagnosis   . Bronchospasm with bronchitis, acute   . Depression   . Kidney stone   . Right carpal tunnel syndrome 12/09/2018  . SBO (small bowel obstruction) (HCC) 01/2014  . Sleep apnea     Past Surgical History:  Procedure Laterality Date  . CARPAL TUNNEL RELEASE Right 08/05/2019   Procedure: RIGHT CARPAL TUNNEL RELEASE;  Surgeon: Cindee Salt, MD;  Location: [facility];  Service: Orthopedics;  Laterality: Right;  IV REGIONAL FOREARM BLOCK  . CESAREAN SECTION    . ENDOMETRIAL ABLATION    . GASTRIC BYPASS  2001  . SBO  2015    Family Psychiatric History: Please see initial evaluation for full details. I have reviewed the history. No updates at this time.     Family History:  Family History  Problem Relation Age of Onset  . Diabetes Mother   . Cancer Mother 60       Throat   . Bipolar disorder Mother   . Bipolar disorder Sister   . Bipolar disorder Brother   . Drug abuse Brother   . Bipolar disorder Sister   . Colon cancer Neg Hx   . Cancer - Prostate Neg Hx   . Breast cancer Neg Hx     Social History:  Social History    Socioeconomic History  . Marital status: Married    Spouse name: Not on file  . Number of children: 2  . Years of education: Not on file  . Highest education level: Not on file  Occupational History  . Occupation: phelbotomist    Employer: [employer]  Tobacco Use  . Smoking status: Never Smoker  . Smokeless tobacco: Never Used  . Tobacco comment: 1/2 ppd   Vaping Use  . Vaping Use: Never used  Substance and Sexual Activity  . Alcohol use: Yes    Alcohol/week: 0.0 standard drinks    Comment: Two times a week.   . Drug use: No  . Sexual activity: Yes    Partners: Male    Birth control/protection: Surgical  Other Topics Concern  . 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 Nsaids Other (See Comments)    Contraindicated with gastric bypass    Metabolic Disorder Labs: Lab Results  Component Value Date   HGBA1C 4.9 02/06/2017   MPG 105 02/26/2015   No results found for: PROLACTIN Lab Results  Component Value Date   CHOL 165 02/06/2017   TRIG 87 02/06/2017   HDL 54 02/06/2017   LDLCALC 94 02/06/2017   Lab Results  Component Value Date   TSH 1.12 07/10/2017   TSH 1.22 06/08/2016    Therapeutic Level Labs: No results found for: LITHIUM Lab Results  Component Value Date   VALPROATE 67 12/18/2019   VALPROATE 71 01/27/2019   No components found for:   CBMZ  Current Medications: Current Outpatient Medications  Medication Sig Dispense Refill  . amphetamine-dextroamphetamine (ADDERALL XR) 25 MG 24 hr capsule Take 25 mg by mouth every morning.    Marland Kitchen amphetamine-dextroamphetamine (ADDERALL XR) 25 MG 24 hr capsule TAKE 1 CAPSULE (25 MG TOTAL) BY MOUTH EVERY MORNING. 30 capsule 0  . amphetamine-dextroamphetamine (ADDERALL XR) 25 MG 24 hr capsule TAKE 1 CAPSULE (25 MG TOTAL) BY MOUTH EVERY MORNING. 30 capsule 0  . amphetamine-dextroamphetamine (ADDERALL XR) 25 MG 24 hr capsule TAKE 1 CAPSULE (25 MG TOTAL)  BY MOUTH EVERY MORNING. 30 capsule 0  . amphetamine-dextroamphetamine (ADDERALL XR) 25 MG 24 hr capsule TAKE 1 CAPSULE (25 MG TOTAL) BY MOUTH EVERY MORNING. 30 capsule 0  . Armodafinil 250 MG tablet TAKE 1 TABLET (250 MG TOTAL) BY MOUTH DAILY FOR 30 DAYS. 30 tablet 5  . Armodafinil 250 MG tablet TAKE 1 TABLET BY MOUTH ONCE DAILY 30 tablet 5  . azithromycin (ZITHROMAX) 500 MG tablet TAKE ONE TABLET BY MOUTH DAILY FOR 3 DAYS 3 tablet 0  . divalproex (DEPAKOTE ER) 500 MG 24 hr tablet Take 4 tablets (2,000 mg total) by mouth at bedtime. 360 tablet 1  . FLUoxetine (PROZAC) 20 MG tablet Take 3 tablets (60 mg total) by mouth daily. 270 tablet 1  . lamoTRIgine (LAMICTAL) 100 MG tablet Take 1 tablet (100 mg total) by mouth daily. 90 tablet 1  . Lurasidone HCl 60 MG TABS Take 1 tablet (60 mg total) by mouth daily. 90 tablet 1  . valACYclovir (VALTREX) 1000 MG tablet Take 2 tablets (2,000 mg total) by mouth 2 (two) times daily. 4 tablet 0  . valACYclovir (VALTREX) 1000 MG tablet TAKE 2 TABLETS BY MOUTH 2 TIMES DAILY 4 tablet 0   No current facility-administered medications for this visit.     Musculoskeletal: Strength & Muscle Tone: N/A Gait & Station: N/A Patient leans: N/A  Psychiatric Specialty Exam: Review of Systems  Psychiatric/Behavioral: Negative for agitation, behavioral problems, confusion, decreased concentration, dysphoric mood,  hallucinations, self-injury, sleep disturbance and suicidal ideas. The patient is not nervous/anxious and is not hyperactive.   All other systems reviewed and are negative.   There were no vitals taken for this visit.There is no height or weight on file to calculate BMI.  General Appearance: Fairly Groomed  Eye Contact:  Good  Speech:  Clear and Coherent  Volume:  Normal  Mood:  good  Affect:  Appropriate, Congruent and Full Range  Thought Process:  Coherent  Orientation:  Full (Time, Place, and Person)  Thought Content: Logical   Suicidal Thoughts:  No  Homicidal Thoughts:  No  Memory:  Immediate;   Good  Judgement:  Good  Insight:  Good  Psychomotor Activity:  Normal  Concentration:  Concentration: Good and Attention Span: Good  Recall:  Good  Fund of Knowledge: Good  Language: Good  Akathisia:  No  Handed:  Right  AIMS (if indicated): not done  Assets:  Communication Skills Desire for Improvement  ADL's:  Intact  Cognition: WNL  Sleep:  Good   Screenings: PHQ2-9   Flowsheet Row Video Visit from 08/31/2020 in [facility] Office Visit from 07/31/2019 in [facility] from [facility]/06/2017 [facility] Visit from 06/08/2016 in [facility] Office Visit from 03/15/2015 in [facility]  PHQ-2 Total Score 0 0 0 0 0  PHQ-9 Total Score -- -- -- 2 --    Flowsheet Row Video Visit from 08/31/2020 in [facility]  C-SSRS RISK CATEGORY No Risk       Assessment and Plan:  Ashley Franklin is a 49 y.o. year old female with a history of bipolar disorder, anxiety, anemia,obesity s/p gastric bypass surgery, who presents for follow up appointment for below.   1. Bipolar disorder, in full remission, most recent episode depressed (HCC) 2. Anxiety state She denies significant mood symptoms since last  visit.  Will continue current medication regimen.  Will continue  Depakote, lamotrigine and Latuda for bipolar disorder.  Will continue fluoxetine to target depression and anxiety.  She is reminded to obtain labs to monitor any side effect from Depakote.    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 Currently on adderall and armodafinil.She has a follow up appointment.   Plan: I have reviewed and updated plans as below 1.ContinueDepakote 2000 mg at night 2. Check CBC, LFT, VPA level  3.Continuefluoxetine 60 mg daily 4.Continuelamotrigine100mg  daily 5.Continuelatuda 60 mg daily 6. Next appointment:7/18 at 9 AM for3530mins, in [email or web address] - on Adderall XR 25 mg daily, armodafinil for narcolepsy  Past trials of medication: Lexapro, Paxil, amitriptyline, Abilify (irritable), oxcarbazepine, perphenazine, Trifluoperazine, Klonopin, Valium. Vistaril,Ambien   The patient demonstrates the following risk factors for suicide: Chronic risk factorsfor suicide include psychiatric disorder /bipolar disorder,previous self-harm of scratching herself. Acute risk factorsfor suicide includenone. Protective factorsfor this patient include positive social support, positive therapeutic relationship,hope for the future. Considering these factors, the overall suicide risk at this point appears to be low. Patient does have gun access at homeand agrees to lock guns. Discussed in detail safety plan that anytime having active suicidal thoughts or homicidal thoughts and she need to call 911 or go to local emergency room.    Neysa Hottereina Janalee Grobe, MD 08/31/2020, 11:29 AM

## 2020-08-27 ENCOUNTER — Other Ambulatory Visit (HOSPITAL_BASED_OUTPATIENT_CLINIC_OR_DEPARTMENT_OTHER): Payer: Self-pay

## 2020-08-27 MED FILL — Lamotrigine Tab 100 MG: ORAL | 30 days supply | Qty: 30 | Fill #0 | Status: AC

## 2020-08-31 ENCOUNTER — Telehealth (INDEPENDENT_AMBULATORY_CARE_PROVIDER_SITE_OTHER): Payer: 59 | Admitting: Psychiatry

## 2020-08-31 ENCOUNTER — Other Ambulatory Visit: Payer: Self-pay

## 2020-08-31 ENCOUNTER — Encounter: Payer: Self-pay | Admitting: Psychiatry

## 2020-08-31 DIAGNOSIS — F411 Generalized anxiety disorder: Secondary | ICD-10-CM | POA: Diagnosis not present

## 2020-08-31 DIAGNOSIS — F3176 Bipolar disorder, in full remission, most recent episode depressed: Secondary | ICD-10-CM | POA: Diagnosis not present

## 2020-08-31 NOTE — Patient Instructions (Signed)
1.ContinueDepakote 2000 mg at night 2. Check CBC, LFT, VPA level  3.Continuefluoxetine 60 mg daily 4.Continuelamotrigine100mg  daily 5.Continuelatuda 60 mg daily 6. Next appointment:7/18 at 9 AM

## 2020-09-20 MED FILL — Armodafinil Tab 250 MG: ORAL | 30 days supply | Qty: 30 | Fill #0 | Status: CN

## 2020-09-21 ENCOUNTER — Other Ambulatory Visit (HOSPITAL_BASED_OUTPATIENT_CLINIC_OR_DEPARTMENT_OTHER): Payer: Self-pay

## 2020-09-22 ENCOUNTER — Other Ambulatory Visit (HOSPITAL_BASED_OUTPATIENT_CLINIC_OR_DEPARTMENT_OTHER): Payer: Self-pay

## 2020-09-22 ENCOUNTER — Other Ambulatory Visit: Payer: Self-pay | Admitting: Psychiatry

## 2020-09-23 ENCOUNTER — Other Ambulatory Visit (HOSPITAL_BASED_OUTPATIENT_CLINIC_OR_DEPARTMENT_OTHER): Payer: Self-pay

## 2020-09-28 ENCOUNTER — Other Ambulatory Visit (HOSPITAL_BASED_OUTPATIENT_CLINIC_OR_DEPARTMENT_OTHER): Payer: Self-pay

## 2020-09-29 ENCOUNTER — Telehealth: Payer: Self-pay

## 2020-09-29 ENCOUNTER — Other Ambulatory Visit (HOSPITAL_BASED_OUTPATIENT_CLINIC_OR_DEPARTMENT_OTHER): Payer: Self-pay

## 2020-09-29 ENCOUNTER — Other Ambulatory Visit: Payer: Self-pay | Admitting: Psychiatry

## 2020-09-29 MED ORDER — LURASIDONE HCL 60 MG PO TABS
60.0000 mg | ORAL_TABLET | Freq: Every day | ORAL | 1 refills | Status: DC
  Filled 2020-09-29: qty 30, 30d supply, fill #0
  Filled 2020-11-01: qty 30, 30d supply, fill #1
  Filled 2020-12-14: qty 30, 30d supply, fill #2
  Filled 2021-01-27: qty 30, 30d supply, fill #3

## 2020-09-29 MED ORDER — DIVALPROEX SODIUM ER 500 MG PO TB24
2000.0000 mg | ORAL_TABLET | Freq: Every day | ORAL | 1 refills | Status: DC
Start: 2020-09-29 — End: 2021-02-25
  Filled 2020-09-29: qty 120, 30d supply, fill #0
  Filled 2020-11-01: qty 120, 30d supply, fill #1
  Filled 2020-12-14: qty 120, 30d supply, fill #2
  Filled 2021-01-27: qty 120, 30d supply, fill #3

## 2020-09-29 NOTE — Telephone Encounter (Signed)
pharmacy confirmed that when they go into the epic system that medications have been discontined so if patient is suppose to be taking then they need a new rx.

## 2020-09-29 NOTE — Telephone Encounter (Signed)
pt called left a message that latuda and the depakote that the pharmacy tells her that in their system this medication has been discontinued.

## 2020-09-29 NOTE — Telephone Encounter (Signed)
Ordered. Please also remind her to do blood test

## 2020-10-01 ENCOUNTER — Other Ambulatory Visit: Payer: Self-pay

## 2020-10-01 ENCOUNTER — Emergency Department (HOSPITAL_BASED_OUTPATIENT_CLINIC_OR_DEPARTMENT_OTHER): Payer: 59

## 2020-10-01 ENCOUNTER — Other Ambulatory Visit: Payer: Self-pay | Admitting: Psychiatry

## 2020-10-01 ENCOUNTER — Telehealth: Payer: Self-pay

## 2020-10-01 ENCOUNTER — Other Ambulatory Visit (HOSPITAL_BASED_OUTPATIENT_CLINIC_OR_DEPARTMENT_OTHER): Payer: Self-pay

## 2020-10-01 ENCOUNTER — Emergency Department (HOSPITAL_BASED_OUTPATIENT_CLINIC_OR_DEPARTMENT_OTHER)
Admission: EM | Admit: 2020-10-01 | Discharge: 2020-10-01 | Disposition: A | Discharge status: Home / Self Care | Payer: 59 | Attending: Emergency Medicine | Admitting: Emergency Medicine

## 2020-10-01 ENCOUNTER — Encounter (HOSPITAL_BASED_OUTPATIENT_CLINIC_OR_DEPARTMENT_OTHER): Payer: Self-pay | Admitting: *Deleted

## 2020-10-01 DIAGNOSIS — R002 Palpitations: Secondary | ICD-10-CM | POA: Diagnosis present

## 2020-10-01 DIAGNOSIS — R42 Dizziness and giddiness: Secondary | ICD-10-CM | POA: Diagnosis not present

## 2020-10-01 DIAGNOSIS — Z79899 Other long term (current) drug therapy: Secondary | ICD-10-CM | POA: Diagnosis not present

## 2020-10-01 DIAGNOSIS — R202 Paresthesia of skin: Secondary | ICD-10-CM | POA: Diagnosis not present

## 2020-10-01 DIAGNOSIS — F410 Panic disorder [episodic paroxysmal anxiety] without agoraphobia: Secondary | ICD-10-CM | POA: Insufficient documentation

## 2020-10-01 DIAGNOSIS — F3176 Bipolar disorder, in full remission, most recent episode depressed: Secondary | ICD-10-CM

## 2020-10-01 LAB — COMPREHENSIVE METABOLIC PANEL
ALT: 14 U/L (ref 0–44)
AST: 22 U/L (ref 15–41)
Albumin: 3.6 g/dL (ref 3.5–5.0)
Alkaline Phosphatase: 62 U/L (ref 38–126)
Anion gap: 8 (ref 5–15)
BUN: 13 mg/dL (ref 6–20)
CO2: 21 mmol/L — ABNORMAL LOW (ref 22–32)
Calcium: 9 mg/dL (ref 8.9–10.3)
Chloride: 108 mmol/L (ref 98–111)
Creatinine, Ser: 0.61 mg/dL (ref 0.44–1.00)
GFR, Estimated: >60 mL/min (ref >60)
Glucose, Bld: 81 mg/dL (ref 70–99)
Potassium: 3.3 mmol/L — ABNORMAL LOW (ref 3.5–5.1)
Sodium: 137 mmol/L (ref 135–145)
Total Bilirubin: 0.5 mg/dL (ref 0.3–1.2)
Total Protein: 6.7 g/dL (ref 6.5–8.1)

## 2020-10-01 LAB — CBC WITH DIFFERENTIAL/PLATELET
Abs Immature Granulocytes: 0.01 10*3/uL (ref 0.00–0.07)
Basophils Absolute: 0 10*3/uL (ref 0.0–0.1)
Basophils Relative: 1 %
Eosinophils Absolute: 0.1 10*3/uL (ref 0.0–0.5)
Eosinophils Relative: 1 %
HCT: 37.8 % (ref 36.0–46.0)
Hemoglobin: 12.5 g/dL (ref 12.0–15.0)
Immature Granulocytes: 0 %
Lymphocytes Relative: 52 %
Lymphs Abs: 2.8 10*3/uL (ref 0.7–4.0)
MCH: 29.8 pg (ref 26.0–34.0)
MCHC: 33.1 g/dL (ref 30.0–36.0)
MCV: 90.2 fL (ref 80.0–100.0)
Monocytes Absolute: 0.5 10*3/uL (ref 0.1–1.0)
Monocytes Relative: 10 %
Neutro Abs: 2 10*3/uL (ref 1.7–7.7)
Neutrophils Relative %: 36 %
Platelets: 243 10*3/uL (ref 150–400)
RBC: 4.19 MIL/uL (ref 3.87–5.11)
RDW: 14.2 % (ref 11.5–15.5)
WBC: 5.4 10*3/uL (ref 4.0–10.5)
nRBC: 0 % (ref 0.0–0.2)

## 2020-10-01 LAB — TROPONIN I (HIGH SENSITIVITY): Troponin I (High Sensitivity): <2 ng/L (ref <18)

## 2020-10-01 MED ORDER — FLUOXETINE HCL 20 MG PO TABS
60.0000 mg | ORAL_TABLET | Freq: Every day | ORAL | 1 refills | Status: DC
  Filled 2020-10-01: qty 90, 30d supply, fill #0
  Filled 2020-11-01: qty 90, 30d supply, fill #1
  Filled 2020-12-14: qty 90, 30d supply, fill #2
  Filled 2021-01-27: qty 90, 30d supply, fill #3

## 2020-10-01 MED ORDER — HYDROXYZINE HCL 25 MG PO TABS: 25.0000 mg | ORAL_TABLET | Freq: Four times a day (QID) | ORAL | 0 refills | Status: DC

## 2020-10-01 MED ORDER — LORAZEPAM 2 MG/ML IJ SOLN
1.0000 mg | Freq: Once | INTRAMUSCULAR | Status: AC
  Administered 2020-10-01: 1 mg via INTRAVENOUS
  Filled 2020-10-01: qty 1

## 2020-10-01 MED ORDER — POTASSIUM CHLORIDE CRYS ER 20 MEQ PO TBCR
40.0000 meq | EXTENDED_RELEASE_TABLET | Freq: Once | ORAL | Status: AC
  Administered 2020-10-01: 40 meq via ORAL
  Filled 2020-10-01: qty 2

## 2020-10-01 MED FILL — Armodafinil Tab 250 MG: ORAL | 30 days supply | Qty: 30 | Fill #0 | Status: AC

## 2020-10-01 NOTE — ED Notes (Signed)
Patient transported to X-ray 

## 2020-10-01 NOTE — Telephone Encounter (Signed)
pt called left a message that the pharmacy still saying that the fluoxetine is showing up as discontinued and she like to find out why. and she need a refill she been out

## 2020-10-01 NOTE — Telephone Encounter (Signed)
Ordered. Please verify with the pharmacy if she still has issues getting fluoxetine.

## 2020-10-01 NOTE — ED Notes (Signed)
Pt up ambulating well, denies any pain or dizziness, states she feels much better

## 2020-10-01 NOTE — Discharge Instructions (Signed)
  You were evaluated in the Emergency Department and after careful evaluation, we did not find any emergent condition requiring admission or further testing in the hospital.   Your exam/testing today was overall reassuring.  Symptoms seem to be due to panic attack.  Please use the attached instructions on this.  You can also use the hydralazine as needed as we discussed for anxiety.  Please follow-up with your psychiatrist in the next couple of days.  If you have any new or worsening concerning symptoms he is back to the ER. Please return to the Emergency Department if you experience any worsening of your condition.  Thank you for allowing [place] to be a part of your care. Please speak to your pharmacist about any new medications prescribed today in regards to side effects or interactions with other medications.

## 2020-10-01 NOTE — ED Provider Notes (Signed)
[facility] EMERGENCY DEPARTMENT Provider Note   CSN: [id number] Arrival date & time: 10/01/20  1406     History Chief Complaint  Patient presents with  . Palpitations    Ashley Franklin is a 50 y.o. female with past medical history of anxiety, bipolar, depression that presents to the emerged from today for palpitations and dizziness and numbness and tingling.  Patient states that this started abruptly while watching TV.  Patient states that they were about to go to a memorial for her uncle.  Patient states that she has had panic attacks before, however this 1 was a little different. States that she is never had numbness and tingling into her face which is where she is having numbness and tingling.  Is bilateral.  Normal gait, denies any neglect, facial droop, aphasia, weakness.  Patient states that she feels as if her heart was beating out of her chest, is now complaining of chest pressure no more longer having palpitations.  Patient also states that she is out of all of her medications for the past 2 weeks, including her psychiatric medications, did have them refilled today by Dr. Evette Doffing them yet.  Patient also admits to some nausea, denies any vomiting.  Denies any diarrhea or abdominal pain.  Denies any back pain.  Patient describes chest pain currently is more as of a pressure sensation, no true pain, does not radiate to back or anywhere else.  Not worse with exertion. denies any coagulation disorder, clot history, calf swelling, recent long travel, recent surgery or chance pregnancy.  Patient states when she is had this before, Ativan has significantly helped her, does have a psychiatrist that she sees, has not seen them for a while.  HPI     Past Medical History:  Diagnosis Date  . Anemia   . Anxiety   . Bipolar affective disorder (HCC)   . Blood transfusion without reported diagnosis   . Bronchospasm with bronchitis, acute   . Depression   . Kidney stone   . Right  carpal tunnel syndrome 12/09/2018  . SBO (small bowel obstruction) (HCC) 01/2014  . Sleep apnea     Patient Active Problem List   Diagnosis Date Noted  . Tremor of both hands 09/01/2019  . Bipolar disorder, in full remission, most recent episode depressed (HCC) 04/23/2019  . Right carpal tunnel syndrome 12/09/2018  . Closed nondisplaced fracture of fifth left metatarsal bone 05/10/2018  . History of shingles 01/08/2018  . Menopause 01/08/2018  . Left ankle injury, subsequent encounter 08/04/2017  . PCP NOTES >>>>>>>>>> 07/11/2017  . 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 Chronic knee pain 04/18/2015  . Plantar fasciitis 04/18/2015  . Tobacco use disorder 03/15/2015  . Morbid obesity (HCC) 01/05/2015  . Bipolar affective (HCC) 01/02/2015    Past Surgical History:  Procedure Laterality Date  . CARPAL TUNNEL RELEASE Right 08/05/2019   Procedure: RIGHT CARPAL TUNNEL RELEASE;  Surgeon: Cindee Salt, MD;  Location:  SURGERY CENTER;  Service: Orthopedics;  Laterality: Right;  IV REGIONAL FOREARM BLOCK  . CESAREAN SECTION    . ENDOMETRIAL ABLATION    . GASTRIC BYPASS  2001  . SBO  2015     OB History    Gravida  2   Para  2   Term  2   Preterm      AB      Living  2     SAB      IAB  Ectopic      Multiple      Live Births  2           Family History  Problem Relation Age of Onset  . Diabetes Mother   . Cancer Mother 60       Throat   . Bipolar disorder Mother   . Bipolar disorder Sister   . Bipolar disorder Brother   . Drug abuse Brother   . Bipolar disorder Sister   . Colon cancer Neg Hx   . Cancer - Prostate Neg Hx   . Breast cancer Neg Hx     Social History   Tobacco Use  . Smoking status: Never Smoker  . Smokeless tobacco: Never Used  . Tobacco comment: 1/2 ppd   Vaping Use  . Vaping Use: Never used  Substance Use Topics  . Alcohol use: Yes    Alcohol/week: 0.0 standard drinks    Comment: Two times a week.   . Drug use: No     Home Medications Prior to Admission medications   Medication Sig Start Date End Date Taking? Authorizing Provider  amphetamine-dextroamphetamine (ADDERALL XR) 25 MG 24 hr capsule Take 25 mg by mouth every morning.   Yes [provider]  Armodafinil 250 MG tablet TAKE 1 TABLET (250 MG TOTAL) BY MOUTH DAILY FOR 30 DAYS. 07/27/20 01/23/21 Yes Denese Killings., MD  divalproex (DEPAKOTE ER) 500 MG 24 hr tablet Take 4 tablets (2,000 mg total) by mouth at bedtime. 09/29/20 03/28/21 Yes Hisada, Barbee Cough, MD  FLUoxetine (PROZAC) 20 MG tablet Take 3 tablets (60 mg total) by mouth daily. 10/01/20 03/30/21 Yes [name], MD  hydrOXYzine (ATARAX/VISTARIL) 25 MG tablet Take 1 tablet (25 mg total) by mouth every 6 (six) hours. 10/01/20  Yes Farrel Gordon, PA-C  lamoTRIgine (LAMICTAL) 100 MG tablet Take 1 tablet (100 mg total) by mouth daily. 07/26/20  Yes Hisada, Barbee Cough, MD  Lurasidone HCl 60 MG TABS Take 1 tablet (60 mg total) by mouth daily. 09/29/20 03/28/21 Yes Hisada, Barbee Cough, MD  amphetamine-dextroamphetamine (ADDERALL XR) 25 MG 24 hr capsule TAKE 1 CAPSULE (25 MG TOTAL) BY MOUTH EVERY MORNING. 07/27/20 01/23/21  Denese Killings., MD  amphetamine-dextroamphetamine (ADDERALL XR) 25 MG 24 hr capsule TAKE 1 CAPSULE (25 MG TOTAL) BY MOUTH EVERY MORNING. 06/22/20 12/19/20  Denese Killings., MD  amphetamine-dextroamphetamine (ADDERALL XR) 25 MG 24 hr capsule TAKE 1 CAPSULE (25 MG TOTAL) BY MOUTH EVERY MORNING. 04/29/20 10/26/20  Denese Killings., MD  amphetamine-dextroamphetamine (ADDERALL XR) 25 MG 24 hr capsule TAKE 1 CAPSULE (25 MG TOTAL) BY MOUTH EVERY MORNING. 03/17/20 09/13/20  Denese Killings., MD  Armodafinil 250 MG tablet TAKE 1 TABLET BY MOUTH ONCE DAILY 01/15/20 07/13/20  Denese Killings., MD  azithromycin (ZITHROMAX) 500 MG tablet TAKE ONE TABLET BY MOUTH DAILY FOR 3 DAYS 03/11/20 03/11/21    valACYclovir (VALTREX) 1000 MG tablet Take 2 tablets (2,000 mg total) by mouth 2 (two) times daily. 06/21/20   Waldon Merl, PA-C   valACYclovir (VALTREX) 1000 MG tablet TAKE 2 TABLETS BY MOUTH 2 TIMES DAILY 06/21/20 06/21/21  Waldon Merl, PA-C    Allergies    Nsaids  Review of Systems   Review of Systems  Constitutional: Negative for chills, diaphoresis, fatigue and fever.  HENT: Negative for congestion, sore throat and trouble swallowing.   Eyes: Negative for pain and visual disturbance.  Respiratory: Negative for cough, shortness of breath and wheezing.   Cardiovascular: Positive for chest  pain and palpitations. Negative for leg swelling.  Gastrointestinal: Positive for nausea. Negative for abdominal distention, abdominal pain, diarrhea and vomiting.  Genitourinary: Negative for difficulty urinating.  Musculoskeletal: Negative for back pain, neck pain and neck stiffness.  Skin: Negative for pallor.  Neurological: Positive for dizziness and numbness. Negative for speech difficulty, weakness and headaches.  Psychiatric/Behavioral: Negative for confusion.    Physical Exam Updated Vital Signs BP (!) 115/93   Pulse 75   Temp 98 F (36.7 C) (Oral)   Resp (!) 22   Ht 5\' 4"  (1.626 m)   Wt 90.7 kg   SpO2 95%   BMI 34.33 kg/m   Physical Exam Constitutional:      General: She is not in acute distress.    Appearance: Normal appearance. She is not ill-appearing, toxic-appearing or diaphoretic.  HENT:     Mouth/Throat:     Mouth: Mucous membranes are moist.     Pharynx: Oropharynx is clear.  Eyes:     General: No scleral icterus.    Extraocular Movements: Extraocular movements intact.     Pupils: Pupils are equal, round, and reactive to light.  Cardiovascular:     Rate and Rhythm: Normal rate and regular rhythm.     Pulses: Normal pulses.     Heart sounds: Normal heart sounds.  Pulmonary:     Effort: Pulmonary effort is normal. No respiratory distress.     Breath sounds: Normal breath sounds. No stridor. No wheezing, rhonchi or rales.  Chest:     Chest wall: No tenderness.  Abdominal:     General:  Abdomen is flat. There is no distension.     Palpations: Abdomen is soft.     Tenderness: There is no abdominal tenderness. There is no guarding or rebound.  Musculoskeletal:        General: No swelling or tenderness. Normal range of motion.     Cervical back: Normal range of motion and neck supple. No rigidity.     Right lower leg: No edema.     Left lower leg: No edema.  Skin:    General: Skin is warm and dry.     Capillary Refill: Capillary refill takes less than 2 seconds.     Coloration: Skin is not pale.  Neurological:     General: No focal deficit present.     Mental Status: She is alert and oriented to person, place, and time.     Comments: Alert. Clear speech. No facial droop. CNIII-XII grossly intact. Bilateral upper and lower extremities' sensation grossly intact.  Normal sensation to face where patient is having tingling.  5/5 symmetric strength with grip strength and with plantar and dorsi flexion bilaterally. . Normal finger to nose bilaterally. Negative pronator drift. Negative Romberg sign. Gait is steady and intact    Psychiatric:        Mood and Affect: Mood normal.     ED Results / Procedures / Treatments   Labs (all labs ordered are listed, but only abnormal results are displayed) Labs Reviewed  COMPREHENSIVE METABOLIC PANEL - Abnormal; Notable for the following components:      Result Value   Potassium 3.3 (*)    CO2 21 (*)    All other components within normal limits  CBC WITH DIFFERENTIAL/PLATELET  TROPONIN I (HIGH SENSITIVITY)    EKG None  Radiology DG Chest 2 View  Result Date: 10/01/2020 CLINICAL DATA:  Chest pain. EXAM: CHEST - 2 VIEW COMPARISON:  December 28, 2017 FINDINGS: The  heart size and mediastinal contours are within normal limits. Both lungs are clear. The visualized skeletal structures are unremarkable. IMPRESSION: No active cardiopulmonary disease. Electronically Signed   By: Aram Candela M.D.   On: 10/01/2020 16:25     Procedures Procedures   Medications Ordered in ED Medications  potassium chloride SA (KLOR-CON) CR tablet 40 mEq (has no administration in time range)  LORazepam (ATIVAN) injection 1 mg (1 mg Intravenous Given 10/01/20 1624)    ED Course  I have reviewed the triage vital signs and the nursing notes.  Pertinent labs & imaging results that were available during my care of the patient were reviewed by me and considered in my medical decision making (see chart for details).    MDM Rules/Calculators/A&P                         LUELLA GARDENHIRE is a 49 y.o. female with past medical history of anxiety, bipolar, depression that presents to the emerged from today for palpitations and dizziness and numbness and tingling.  Patient without any focal neurodeficit, differential to include panic attack with symptoms including palpitations, nausea, dizziness and numbness and tingling.  Symptom onset less than an hour ago, has been slowly resolving.  This could be exacerbated by patient going to [place] today in addition to patient restarting all of her medications today.  We will obtain basic work-up and chest x-ray troponin since patient is having chest pain.  Patient is PERC negative, low likelihood for PE at this time.  Upon reevaluation, patient states that she feels much better, symptoms have resolved.  Work-up today unremarkable with CMP with potassium of 3.3.  Did replete here today.  CBC unremarkable.  Troponin less than 2, do not think that we need repeat troponin at this time, do not think this is cardiac and patient is symptom-free after Ativan..  Check x-ray interpreted without any acute cardiopulmonary disease.  EKG without any acute changes.  Did discuss the patient most likely had panic attack, patient agrees, will discharge at this time, symptomatic treatment discussed, patient states that she will follow-up with her psychiatrist on Monday.  Doubt need for further emergent work up at this  time. I explained the diagnosis and have given explicit precautions to return to the ER including for any other new or worsening symptoms. The patient understands and accepts the medical plan as it's been dictated and I have answered their questions. Discharge instructions concerning home care and prescriptions have been given. The patient is STABLE and is discharged to home in good condition.   Final Clinical Impression(s) / ED Diagnoses Final diagnoses:  Panic attack    Rx / DC Orders ED Discharge Orders         Ordered    hydrOXYzine (ATARAX/VISTARIL) 25 MG tablet  Every 6 hours        10/01/20 1818           Farrel Gordon, PA-C 10/01/20 1832    Koleen Distance, MD 10/01/20 [phone]

## 2020-10-01 NOTE — ED Triage Notes (Addendum)
C/o palpitations  x 1 hr,  Was out of meds x 2 weeks , and started back all of them today, pt states she feels like she is having a panic attack

## 2020-11-01 ENCOUNTER — Other Ambulatory Visit: Payer: Self-pay

## 2020-11-01 MED FILL — Armodafinil Tab 250 MG: ORAL | 30 days supply | Qty: 30 | Fill #1 | Status: AC

## 2020-11-02 ENCOUNTER — Other Ambulatory Visit (HOSPITAL_BASED_OUTPATIENT_CLINIC_OR_DEPARTMENT_OTHER): Payer: Self-pay

## 2020-11-03 ENCOUNTER — Other Ambulatory Visit (HOSPITAL_BASED_OUTPATIENT_CLINIC_OR_DEPARTMENT_OTHER): Payer: Self-pay

## 2020-11-04 ENCOUNTER — Telehealth: Payer: Self-pay

## 2020-11-04 ENCOUNTER — Other Ambulatory Visit: Payer: Self-pay | Admitting: Psychiatry

## 2020-11-04 ENCOUNTER — Other Ambulatory Visit (HOSPITAL_BASED_OUTPATIENT_CLINIC_OR_DEPARTMENT_OTHER): Payer: Self-pay

## 2020-11-04 ENCOUNTER — Other Ambulatory Visit: Payer: Self-pay

## 2020-11-04 MED ORDER — AMPHETAMINE-DEXTROAMPHET ER 25 MG PO CP24
ORAL_CAPSULE | ORAL | 0 refills | Status: DC
  Filled 2020-11-04: qty 30, 30d supply, fill #0

## 2020-11-04 MED ORDER — LAMOTRIGINE 100 MG PO TABS
100.0000 mg | ORAL_TABLET | Freq: Every day | ORAL | 1 refills | Status: DC
  Filled 2020-11-04: qty 30, 30d supply, fill #0

## 2020-11-04 NOTE — Telephone Encounter (Signed)
pt called states that you discontinued her lamictal per the pharmancy on 08-31-20 and so she needs a new rx sent to the pharmacy.

## 2020-11-04 NOTE — Telephone Encounter (Signed)
Ordered

## 2020-11-04 NOTE — Telephone Encounter (Signed)
called pharmacy they confirmed that on  08-31-20 you discontinued her medication of lamictal

## 2020-11-05 ENCOUNTER — Other Ambulatory Visit: Payer: Self-pay | Admitting: Psychiatry

## 2020-11-05 ENCOUNTER — Other Ambulatory Visit (HOSPITAL_BASED_OUTPATIENT_CLINIC_OR_DEPARTMENT_OTHER): Payer: Self-pay

## 2020-11-05 MED ORDER — LAMOTRIGINE 100 MG PO TABS
100.0000 mg | ORAL_TABLET | Freq: Every day | ORAL | 1 refills | Status: DC
  Filled 2020-12-14: qty 30, 30d supply, fill #0
  Filled 2021-01-27: qty 30, 30d supply, fill #1
  Filled 2021-03-25: qty 30, 30d supply, fill #2

## 2020-11-05 NOTE — Telephone Encounter (Signed)
  pharmacy contact stating that pt requested refill but they still have not received rx please resend it did not go threw.    lamoTRIgine (LAMICTAL) 100 MG tablet Medication Date: 11/04/2020 Department: [facility] Ordering/Authorizing: [name], MD    Order Providers  Prescribing Provider Encounter Provider  [name], MD [name], MD   Outpatient Medication Detail   Disp Refills Start End   lamoTRIgine (LAMICTAL) 100 MG tablet 90 tablet 1 11/04/2020 05/03/2021   Sig - Route: Take 1 tablet (100 mg total) by mouth daily. - Oral   Sent to pharmacy as: lamoTRIgine (LAMICTAL) 100 MG tablet

## 2020-11-05 NOTE — Telephone Encounter (Signed)
Ordered

## 2020-11-08 ENCOUNTER — Other Ambulatory Visit (HOSPITAL_BASED_OUTPATIENT_CLINIC_OR_DEPARTMENT_OTHER): Payer: Self-pay

## 2020-11-09 ENCOUNTER — Other Ambulatory Visit (HOSPITAL_BASED_OUTPATIENT_CLINIC_OR_DEPARTMENT_OTHER): Payer: Self-pay

## 2020-11-16 ENCOUNTER — Telehealth: Payer: 59 | Admitting: Nurse Practitioner

## 2020-11-16 DIAGNOSIS — B002 Herpesviral gingivostomatitis and pharyngotonsillitis: Secondary | ICD-10-CM | POA: Diagnosis not present

## 2020-11-17 ENCOUNTER — Other Ambulatory Visit (HOSPITAL_BASED_OUTPATIENT_CLINIC_OR_DEPARTMENT_OTHER): Payer: Self-pay

## 2020-11-17 MED ORDER — VALACYCLOVIR HCL 1 G PO TABS
ORAL_TABLET | ORAL | 0 refills | Status: DC
  Filled 2020-11-17: qty 8, 2d supply, fill #0

## 2020-11-17 NOTE — Progress Notes (Signed)
We are sorry that you are not feeling well.  Here is how we plan to help!  Based on what you have shared with me it does look like you have a viral infection.    Most cold sores or fever blisters are small fluid filled blisters around the mouth caused by herpes simplex virus.  The most common strain of the virus causing cold sores is herpes simplex virus 1.  It can be spread by skin contact, sharing eating utensils, or even sharing towels.  Cold sores are contagious to other people until dry. (Approximately 5-7 days).  Wash your hands. You can spread the virus to your eyes through handling your contact lenses after touching the lesions.  Most people experience pain at the sight or tingling sensations in their lips that may begin before the ulcers erupt.  Herpes simplex is treatable but not curable.  It may lie dormant for a long time and then reappear due to stress or prolonged sun exposure.  Many patients have success in treating their cold sores with an over the counter topical called Abreva.  You may apply the cream up to 5 times daily (maximum 10 days) until healing occurs.  If you would like to use an oral antiviral medication to speed the healing of your cold sore, I have sent a prescription to your local pharmacy Valacyclovir 2 gm take one by mouth twice a day for 1 day    HOME CARE:  Wash your hands frequently. Do not pick at or rub the sore. Don't open the blisters. Avoid kissing other people during this time. Avoid sharing drinking glasses, eating utensils, or razors. Do not handle contact lenses unless you have thoroughly washed your hands with soap and warm water! Avoid oral sex during this time.  Herpes from sores on your mouth can spread to your partner's genital area. Avoid contact with anyone who has eczema or a weakened immune system. Cold sores are often triggered by exposure to intense sunlight, use a lip balm containing a sunscreen (SPF 30 or higher).  GET HELP RIGHT AWAY  IF:  Blisters look infected. Blisters occur near or in the eye. Symptoms last longer than 10 days. Your symptoms become worse.  MAKE SURE YOU:  Understand these instructions. Will watch your condition. Will get help right away if you are not doing well or get worse.    Your e-visit answers were reviewed by a board certified advanced clinical practitioner to complete your personal care plan.  Depending upon the condition, your plan could have  Included both over the counter or prescription medications.    Please review your pharmacy choice.  Be sure that the pharmacy you have chosen is open so that you can pick up your prescription now.  If there is a problem you can message your provider in MyChart to have the prescription routed to another pharmacy.    Your safety is important to us.  If you have drug allergies check our prescription carefully.  For the next 24 hours you can use MyChart to ask questions about today's visit, request a non-urgent call back, or ask for a work or school excuse from your e-visit provider.  You will get an email in the next two days asking about your experience.  I hope that your e-visit has been valuable and will speed your recovery.  5-10 minutes spent reviewing and documenting in chart.   

## 2020-11-23 NOTE — Progress Notes (Signed)
BH MD/PA/NP OP Progress Note  11/29/2020 9:36 AM Ashley Franklin  MRN:  [id number]  Chief Complaint:  Chief Complaint   Follow-up; Other    HPI:  This is a follow up appointment for bipolar disorder and insomnia.  She states that she had a panic attack when she was at home by herself. She did not have any triggers, and she thought of calling EMS as she was concerned of having MI. She denies any panic attack since then. She tends to feel anxious when she visits a house where husband's family gather every other week. She is planning to limit going there, and her husband is support of this. 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 She sees a sleep specialist in [facility], and they will do LP. Although she sleeps very well at night, she tends to feel sleepy during the day at least a few times per day, although she is able to make herself awake. She denies feeling depressed. Although she feels tired and has anhedonia at times, she thinks it is more for her to want to relax with her cat at home. She has lost weight; she tends to has nausea after she eats ("zero food tolerance"); she agrees to see her PCP. She denies decreased need for sleep, euphoria, or irritability. She feels comfortable to continue her medication.     Daily routine: work Exercise: takes a walk Employment: work 8;30-5pm,  Household: husband  Marital status: married  Number of children: 2. her daughter obtained PHD in pharmacy, her son bought a house   Wt Readings from Last 3 Encounters:  11/29/20 200 lb 12.8 oz (91.1 kg)  11/24/20 200 lb (90.7 kg)  10/01/20 200 lb (90.7 kg)      Visit Diagnosis:    ICD-10-CM   1. Bipolar disorder, in full remission, most recent episode depressed (HCC)  F31.76 Valproic acid level    2. Anxiety state  F41.1       Past Psychiatric History: Please see initial evaluation for full details. I have reviewed the history. No updates at this time.     Past Medical History:  Past Medical  History:  Diagnosis Date   Anemia    Anxiety    Bipolar affective disorder (HCC)    Blood transfusion without reported diagnosis    Bronchospasm with bronchitis, acute    Depression    Kidney stone    Right carpal tunnel syndrome 12/09/2018   SBO (small bowel obstruction) (HCC) 01/2014   Sleep apnea     Past Surgical History:  Procedure Laterality Date   CARPAL TUNNEL RELEASE Right 08/05/2019   Procedure: RIGHT CARPAL TUNNEL RELEASE;  Surgeon: Cindee Salt, MD;  Location: [facility];  Service: Orthopedics;  Laterality: Right;  IV REGIONAL FOREARM BLOCK   CESAREAN SECTION     ENDOMETRIAL ABLATION     GASTRIC BYPASS  2001   SBO  2015    Family Psychiatric History: Please see initial evaluation for full details. I have reviewed the history. No updates at this time.     Family History:  Family History  Problem Relation Age of Onset   Diabetes Mother    Cancer Mother 106       Throat    Bipolar disorder Mother    Bipolar disorder Sister    Bipolar disorder Brother    Drug abuse Brother    Bipolar disorder Sister    Colon cancer Neg Hx    Cancer - Prostate  Neg Hx    Breast cancer Neg Hx     Social History:  Social History   Socioeconomic History   Marital status: Married    Spouse name: Not on file   Number of children: 2   Years of education: Not on file   Highest education level: Not on file  Occupational History   Occupation: phelbotomist    Employer: [employer]  Tobacco Use   Smoking status: Former    Packs/day: 0.25    Years: 6.00    Pack years: 1.50    Types: Cigarettes   Smokeless tobacco: Never   Tobacco comments:    1/2 ppd   Vaping Use   Vaping Use: Never used  Substance and Sexual Activity   Alcohol use: Yes    Alcohol/week: 0.0 standard drinks    Comment: Two times a week.    Drug use: No   Sexual activity: Yes    Partners: Male    Birth control/protection: Surgical  Other Topics Concern   Not on file  Social History Narrative    The patient is a phlebotomist at [employer]    1 son born 1988-08-13 daughter born in [id number] she is married and lives with her husband   Prior smoker not current 3 caffeinated beverages a day no alcohol tobacco or drug use      2 independent Acupuncturistteachers    Pharmacy schools, music teacher   Right handed    Caffeine 2 cups daily    Social Determinants of Health   Financial Resource Strain: Not on file  Food Insecurity: Not on file  Transportation Needs: Not on file  Physical Activity: Not on file  Stress: Not on file  Social Connections: Not on file    Allergies:  Allergies  Allergen Reactions   Nsaids Other (See Comments)    Contraindicated with gastric bypass    Metabolic Disorder Labs: Lab Results  Component Value Date   HGBA1C 4.9 02/06/2017   MPG 105 02/26/2015   No results found for: PROLACTIN Lab Results  Component Value Date   CHOL 165 02/06/2017   TRIG 87 02/06/2017   HDL 54 02/06/2017   LDLCALC 94 02/06/2017   Lab Results  Component Value Date   TSH 1.12 07/10/2017   TSH 1.22 06/08/2016    Therapeutic Level Labs: No results found for: LITHIUM Lab Results  Component Value Date   VALPROATE 67 12/18/2019   VALPROATE 71 01/27/2019   No components found for:  CBMZ  Current Medications: Current Outpatient Medications  Medication Sig Dispense Refill   amphetamine-dextroamphetamine (ADDERALL XR) 25 MG 24 hr capsule TAKE 1 CAPSULE (25 MG TOTAL) BY MOUTH EVERY MORNING. 30 capsule 0   Armodafinil 250 MG tablet Take 1 tablet (250 mg total) by mouth daily 30 tablet 5   divalproex (DEPAKOTE ER) 500 MG 24 hr tablet Take 4 tablets (2,000 mg total) by mouth at bedtime. 360 tablet 1   FLUoxetine (PROZAC) 20 MG tablet Take 3 tablets (60 mg total) by mouth daily. 270 tablet 1   hydrOXYzine (ATARAX/VISTARIL) 25 MG tablet Take 1 tablet (25 mg total) by mouth every 6 (six) hours. 30 tablet 0   lamoTRIgine (LAMICTAL) 100 MG tablet Take 1 tablet (100 mg total) by mouth  daily. 90 tablet 1   Lurasidone HCl 60 MG TABS Take 1 tablet (60 mg total) by mouth daily. 90 tablet 1   methocarbamol (ROBAXIN) 500 MG tablet Take 1 tablet (500 mg total) by mouth  2 (two) times daily. 20 tablet 0   ondansetron (ZOFRAN ODT) 4 MG disintegrating tablet Take 1 tablet (4 mg total) by mouth every 8 (eight) hours as needed for nausea or vomiting. 20 tablet 0   valACYclovir (VALTREX) 1000 MG tablet Take 2 tablets by mouth twice a day for 1day at fever blister onset. 8 tablet 0   No current facility-administered medications for this visit.     Musculoskeletal: Strength & Muscle Tone:  N/A Gait & Station:  N/A Patient leans: N/A  Psychiatric Specialty Exam: Review of Systems  Psychiatric/Behavioral:  Positive for sleep disturbance. Negative for agitation, behavioral problems, confusion, decreased concentration, dysphoric mood, hallucinations, self-injury and suicidal ideas. The patient is nervous/anxious. The patient is not hyperactive.   All other systems reviewed and are negative.  Blood pressure 121/84, pulse 98, temperature (!) 97.2 F (36.2 C), temperature source Temporal, weight 200 lb 12.8 oz (91.1 kg).Body mass index is 34.47 kg/m.  General Appearance: Fairly Groomed  Eye Contact:  Good  Speech:  Clear and Coherent  Volume:  Normal  Mood:   good  Affect:  Appropriate, Congruent, and Full Range  Thought Process:  Coherent  Orientation:  Full (Time, Place, and Person)  Thought Content: Logical   Suicidal Thoughts:  No  Homicidal Thoughts:  No  Memory:  Immediate;   Good  Judgement:  Good  Insight:  Good  Psychomotor Activity:  Normal- very subtle cogwheel rigidity on her left arm  Concentration:  Concentration: Good and Attention Span: Good  Recall:  Good  Fund of Knowledge: Good  Language: Good  Akathisia:  No  Handed:  Right  AIMS (if indicated): not done  Assets:  Communication Skills Desire for Improvement  ADL's:  Intact  Cognition: WNL  Sleep:    hypersomnia   Screenings: PHQ2-9    Flowsheet Row Office Visit from 11/29/2020 in [facility] Video Visit from 08/31/2020 in [facility] Office Visit from 07/31/2019 in [facility] from 03/06/2017 [facility] Office Visit from 06/08/2016 in [facility]  PHQ-2 Total Score 1 0 0 0 0  PHQ-9 Total Score -- -- -- -- 2      Flowsheet Row Office Visit from 11/29/2020 in [facility] ED from 11/24/2020 in [facility] ED from 10/01/2020 in [facility]  C-SSRS RISK CATEGORY Error: Question 6 not populated Error: Question 6 not populated Error: Question 6 not populated        Assessment and Plan:  Ashley Franklin is a 50 y.o. year old female with a history of history of bipolar disorder, anxiety, anemia, obesity s/p gastric bypass surgery, who presents for follow up appointment for below.   1. Bipolar disorder, in full remission, most recent episode depressed (HCC) 2. Anxiety state She denies significant mood symptoms except significant panic attack and self limited anxiety since the last visit. Will continue fluoxetine at the current dose to target depression and anxiety a this time. Will continue Depakote, lamotrigine and latuda for bipolar disorder. Will obtain labs to monitor Depakote level.   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 Currently on adderall and armodafinil.  She has a follow up appointment.    Plan:  I have reviewed and updated plans as below  1. Continue Depakote 2000 mg at night  2. Check VPA level (CMP, Plt normal 09/2020) 3. Continue fluoxetine 60 mg daily  4. Continue lamotrigine 100 mg daily  5. Continue latuda 60 mg daily - monitor rigidity on left arm 6. Next appointment: 10/14 at 8 AM for 30 mins, video   [email or web address] - on Adderall XR 25 mg daily, armodafinil for narcolepsy   Past trials of medication: Lexapro, Paxil, amitriptyline, Abilify (irritable), oxcarbazepine, perphenazine, Trifluoperazine, Klonopin, Valium. Vistaril, Ambien     The patient demonstrates the following  risk factors for suicide: Chronic risk factors for suicide include psychiatric disorder /bipolar disorder, previous self-harm of scratching herself. Acute risk factors for suicide include none. Protective factors for this patient include positive social support, positive therapeutic relationship, hope for the future. Considering these factors, the overall suicide risk at this point appears to be low. Patient does have gun access at home and agrees to lock guns. Discussed in detail safety plan that anytime having active suicidal thoughts or homicidal thoughts and she need to call 911 or go to local emergency room.       [name], MD 11/29/2020, 9:36 AM

## 2020-11-24 ENCOUNTER — Encounter (HOSPITAL_BASED_OUTPATIENT_CLINIC_OR_DEPARTMENT_OTHER): Payer: Self-pay | Admitting: *Deleted

## 2020-11-24 ENCOUNTER — Emergency Department (HOSPITAL_BASED_OUTPATIENT_CLINIC_OR_DEPARTMENT_OTHER)
Admission: EM | Admit: 2020-11-24 | Discharge: 2020-11-24 | Disposition: A | Discharge status: Home / Self Care | Payer: 59 | Attending: Emergency Medicine | Admitting: Emergency Medicine

## 2020-11-24 ENCOUNTER — Other Ambulatory Visit: Payer: Self-pay

## 2020-11-24 ENCOUNTER — Emergency Department (HOSPITAL_BASED_OUTPATIENT_CLINIC_OR_DEPARTMENT_OTHER): Payer: 59

## 2020-11-24 DIAGNOSIS — W19XXXA Unspecified fall, initial encounter: Secondary | ICD-10-CM

## 2020-11-24 DIAGNOSIS — Z87891 Personal history of nicotine dependence: Secondary | ICD-10-CM | POA: Insufficient documentation

## 2020-11-24 DIAGNOSIS — S0990XA Unspecified injury of head, initial encounter: Secondary | ICD-10-CM | POA: Diagnosis present

## 2020-11-24 DIAGNOSIS — M542 Cervicalgia: Secondary | ICD-10-CM | POA: Diagnosis not present

## 2020-11-24 DIAGNOSIS — M533 Sacrococcygeal disorders, not elsewhere classified: Secondary | ICD-10-CM | POA: Diagnosis not present

## 2020-11-24 DIAGNOSIS — W01198A Fall on same level from slipping, tripping and stumbling with subsequent striking against other object, initial encounter: Secondary | ICD-10-CM | POA: Diagnosis not present

## 2020-11-24 MED ORDER — ONDANSETRON 4 MG PO TBDP
4.0000 mg | ORAL_TABLET | Freq: Once | ORAL | Status: AC
  Administered 2020-11-24: 4 mg via ORAL
  Filled 2020-11-24: qty 1

## 2020-11-24 MED ORDER — ONDANSETRON 4 MG PO TBDP: 4.0000 mg | ORAL_TABLET | Freq: Three times a day (TID) | ORAL | 0 refills | Status: DC | PRN

## 2020-11-24 MED ORDER — IBUPROFEN 400 MG PO TABS
400.0000 mg | ORAL_TABLET | Freq: Once | ORAL | Status: AC
  Administered 2020-11-24: 400 mg via ORAL
  Filled 2020-11-24: qty 1

## 2020-11-24 MED ORDER — METHOCARBAMOL 500 MG PO TABS: 500.0000 mg | ORAL_TABLET | Freq: Two times a day (BID) | ORAL | 0 refills | Status: DC

## 2020-11-24 NOTE — ED Triage Notes (Signed)
C/o fall from standing hitting head on concrete x 3 hrs ago , denies LOC

## 2020-11-24 NOTE — ED Provider Notes (Signed)
[facility] Provider Note   CSN: [id number] Arrival date & time: 11/24/20  1723     History Chief Complaint  Patient presents with   Ashley Franklin    Ashley Franklin is a 50 y.o. female.  HPI Patient is a 50 year old female with past medical history detailed below presented today for headache, neck pain, lower buttock/sacral pain  Patient presents today after mechanical fall.  She states that a bug suppressor while she was in her basement doing laundry she states that the dog ran across the ground causing her to slip and her feet went above her head she states she fell onto her upper back/neck and smacked her head on the ground.  She states she does not think that she passed out but was alone and felt quite woozy after she hit her head and therefore is not completely certain.  This incident occurred at approximately 2:30 PM this is 4 hours from and this note was written.  She states she has a mild headache took some Tylenol states that it is improving.  She states she has some mild nausea.  Some neck pain and also has some pain in her buttocks.  She denies any fevers or chills denies any numbness or weakness.  No slurred speech or confusion denies any known seizure-like activity.  She states she has been ambulatory and brought her self to the ER for evaluation of this fall.  No other associate symptoms.  No aggravating mitigating factors.     Past Medical History:  Diagnosis Date   Anemia    Anxiety    Bipolar affective disorder (HCC)    Blood transfusion without reported diagnosis    Bronchospasm with bronchitis, acute    Depression    Kidney stone    Right carpal tunnel syndrome 12/09/2018   SBO (small bowel obstruction) (HCC) 01/2014   Sleep apnea     Patient Active Problem List   Diagnosis Date Noted   Tremor of both hands 09/01/2019   Bipolar disorder, in full remission, most recent episode depressed (HCC) 04/23/2019   Right carpal tunnel syndrome  12/09/2018   Closed nondisplaced fracture of fifth left metatarsal bone 05/10/2018   History of shingles 01/08/2018   Menopause 01/08/2018   Left ankle injury, subsequent encounter 08/04/2017   PCP NOTES >>>>>>>>>> 07/11/2017   Apneic episode 06/25/2015   Chronic knee pain 04/18/2015   Plantar fasciitis 04/18/2015   Tobacco use disorder 03/15/2015   Morbid obesity (HCC) 01/05/2015   Bipolar affective (HCC) 01/02/2015    Past Surgical History:  Procedure Laterality Date   CARPAL TUNNEL RELEASE Right 08/05/2019   Procedure: RIGHT CARPAL TUNNEL RELEASE;  Surgeon: Cindee SaltKuzma, Gary, MD;  Location: [facility];  Service: Orthopedics;  Laterality: Right;  IV REGIONAL FOREARM BLOCK   CESAREAN SECTION     ENDOMETRIAL ABLATION     GASTRIC BYPASS  2001   SBO  2015     OB History     Gravida  2   Para  2   Term  2   Preterm      AB      Living  2      SAB      IAB      Ectopic      Multiple      Live Births  2           Family History  Problem Relation Age of Onset   Diabetes Mother  Cancer Mother 74       Throat    Bipolar disorder Mother    Bipolar disorder Sister    Bipolar disorder Brother    Drug abuse Brother    Bipolar disorder Sister    Colon cancer Neg Hx    Cancer - Prostate Neg Hx    Breast cancer Neg Hx     Social History   Tobacco Use   Smoking status: Former    Packs/day: 0.25    Years: 6.00    Pack years: 1.50    Types: Cigarettes   Smokeless tobacco: Never   Tobacco comments:    1/2 ppd   Vaping Use   Vaping Use: Never used  Substance Use Topics   Alcohol use: Yes    Alcohol/week: 0.0 standard drinks    Comment: Two times a week.    Drug use: No    Home Medications Prior to Admission medications   Medication Sig Start Date End Date Taking? Authorizing Provider  amphetamine-dextroamphetamine (ADDERALL XR) 25 MG 24 hr capsule Take 25 mg by mouth every morning.    [provider]   amphetamine-dextroamphetamine (ADDERALL XR) 25 MG 24 hr capsule TAKE 1 CAPSULE (25 MG TOTAL) BY MOUTH EVERY MORNING. 11/03/20     amphetamine-dextroamphetamine (ADDERALL XR) 25 MG 24 hr capsule TAKE 1 CAPSULE (25 MG TOTAL) BY MOUTH EVERY MORNING. 06/22/20 12/19/20  Denese Killings., MD  amphetamine-dextroamphetamine (ADDERALL XR) 25 MG 24 hr capsule TAKE 1 CAPSULE (25 MG TOTAL) BY MOUTH EVERY MORNING. 04/29/20 10/26/20  Denese Killings., MD  amphetamine-dextroamphetamine (ADDERALL XR) 25 MG 24 hr capsule TAKE 1 CAPSULE (25 MG TOTAL) BY MOUTH EVERY MORNING. 03/17/20 09/13/20  Denese Killings., MD  Armodafinil 250 MG tablet TAKE 1 TABLET (250 MG TOTAL) BY MOUTH DAILY FOR 30 DAYS. 07/27/20 01/23/21  Denese Killings., MD  Armodafinil 250 MG tablet TAKE 1 TABLET BY MOUTH ONCE DAILY 01/15/20 07/13/20  Denese Killings., MD  azithromycin (ZITHROMAX) 500 MG tablet TAKE ONE TABLET BY MOUTH DAILY FOR 3 DAYS 03/11/20 03/11/21    divalproex (DEPAKOTE ER) 500 MG 24 hr tablet Take 4 tablets (2,000 mg total) by mouth at bedtime. 09/29/20 03/28/21  [name], MD  FLUoxetine (PROZAC) 20 MG tablet Take 3 tablets (60 mg total) by mouth daily. 10/01/20 03/30/21  [name], MD  hydrOXYzine (ATARAX/VISTARIL) 25 MG tablet Take 1 tablet (25 mg total) by mouth every 6 (six) hours. 10/01/20   Farrel Gordon, PA-C  lamoTRIgine (LAMICTAL) 100 MG tablet Take 1 tablet (100 mg total) by mouth daily. 11/05/20 05/04/21  [name], MD  Lurasidone HCl 60 MG TABS Take 1 tablet (60 mg total) by mouth daily. 09/29/20 03/28/21  [name], MD  valACYclovir (VALTREX) 1000 MG tablet Take 2 tablets by mouth twice a day for 1day at fever blister onset. 11/17/20   Bennie Pierini, FNP    Allergies    Nsaids  Review of Systems   Review of Systems  Constitutional:  Negative for chills and fever.  HENT:  Negative for congestion.   Eyes:  Negative for pain.  Respiratory:  Negative for cough and shortness of breath.   Cardiovascular:  Negative for  chest pain and leg swelling.  Gastrointestinal:  Negative for abdominal pain and vomiting.  Genitourinary:  Negative for dysuria.  Musculoskeletal:  Positive for neck pain. Negative for myalgias.  Skin:  Negative for rash.  Neurological:  Positive for headaches. Negative for dizziness.   Physical Exam  Updated Vital Signs BP (!) 147/104 (BP Location: Right Arm)   Pulse 65   Temp 98.3 F (36.8 C) (Oral)   Resp 16   Ht 5\' 4"  (1.626 m)   Wt 90.7 kg   SpO2 100%   BMI 34.33 kg/m   Physical Exam Vitals and nursing note reviewed.  Constitutional:      General: She is not in acute distress.    Comments: Pleasant 50 year old female appears uncomfortable but in no acute distress.  Able answer questions appropriately follow commands.  HENT:     Head: Normocephalic and atraumatic.     Nose: Nose normal.  Eyes:     General: No scleral icterus. Cardiovascular:     Rate and Rhythm: Normal rate and regular rhythm.     Pulses: Normal pulses.     Heart sounds: Normal heart sounds.  Pulmonary:     Effort: Pulmonary effort is normal. No respiratory distress.     Breath sounds: No wheezing.  Abdominal:     Palpations: Abdomen is soft.     Tenderness: There is no abdominal tenderness.  Musculoskeletal:     Cervical back: Normal range of motion.     Right lower leg: No edema.     Left lower leg: No edema.     Comments: TTP of midline C-spine.  There is also some diffuse muscular paracervical tenderness.  There is also some tenderness of the sacrum/coccyx.  No focal tenderness however.  Head with some tenderness palpation of the occiput.  There is a small contusion here.  No bony tenderness over joints or long bones of the upper and lower extremities.    No back midline tenderness, step-off, deformity, or bruising.   Full range of motion of upper and lower extremity joints shown after palpation was conducted; with 5/5 symmetrical strength in upper and lower extremities. No chest wall  tenderness, no facial TTP  Patient has intact sensation grossly in lower and upper extremities. Intact patellar and ankle reflexes. Patient able to ambulate without difficulty.  Radial and DP pulses palpated BL.   Skin:    General: Skin is warm and dry.     Capillary Refill: Capillary refill takes less than 2 seconds.  Neurological:     Mental Status: She is alert. Mental status is at baseline.  Psychiatric:        Mood and Affect: Mood normal.        Behavior: Behavior normal.    ED Results / Procedures / Treatments   Labs (all labs ordered are listed, but only abnormal results are displayed) Labs Reviewed - No data to display  EKG None  Radiology No results found.  Procedures Procedures   Medications Ordered in ED Medications  ondansetron (ZOFRAN-ODT) disintegrating tablet 4 mg (4 mg Oral Not Given 11/24/20 1822)    ED Course  I have reviewed the triage vital signs and the nursing notes.  Pertinent labs & imaging results that were available during my care of the patient were reviewed by me and considered in my medical decision making (see chart for details).  Patient here after mechanical fall.  Neck pain, sacral pain, headache.  Neurologically intact.  Does have lump to the back of her head./Contusion.  Does have midline cervical spine tenderness palpation and some sacral tenderness otherwise normal examination.  Relatively low suspicion for fracture will obtain imaging however to confirm this.  Clinical Course as of 11/25/20 0019  Wed Nov 24, 2020  1925 IMPRESSION: CT of  the head: No acute intracranial abnormality noted.   CT of the cervical spine: Mild straightening of the normal cervical lordosis likely related to muscular spasm. No acute bony abnormality is noted.   [WF]  1925 Xray of sacrum/coccyx unremarkable no fracture  [WF]    Clinical Course User Index [WF] Gailen Shelter, [place]   MDM Rules/Calculators/A&P                          X-ray CT  scan unremarkable.  DC w tylenol robaxin and rest, ice and compression.   Final Clinical Impression(s) / ED Diagnoses Final diagnoses:  Fall, initial encounter  Injury of head, initial encounter  Pain of neck with recent traumatic injury  Coccyx pain    Rx / DC Orders ED Discharge Orders     None        Gailen Shelter, [place] 11/25/20 0019    Tilden Fossa, MD 11/26/20 1239

## 2020-11-24 NOTE — Discharge Instructions (Addendum)
The CT scan of your head, cervical spine and sacrum without any evidence of fractures or intracranial hemorrhage.  Overall your work-up was reassuring.  Please use Tylenol or ibuprofen for pain.  You may use 600 mg ibuprofen every 6 hours or 1000 mg of Tylenol every 6 hours.  You may choose to alternate between the 2.  This would be most effective.  Not to exceed 4 g of Tylenol within 24 hours.  Not to exceed 3200 mg ibuprofen 24 hours.

## 2020-11-24 NOTE — ED Notes (Signed)
Patient transported to X-ray & CT °

## 2020-11-24 NOTE — ED Notes (Addendum)
States tripped while doing laundry, struck L side of head on floor. No abrasion or hematoma noted. Denies syncope, LOC. Also c/o pain to coccyx. Not on any anticoagulants.

## 2020-11-26 ENCOUNTER — Other Ambulatory Visit (HOSPITAL_BASED_OUTPATIENT_CLINIC_OR_DEPARTMENT_OTHER): Payer: Self-pay

## 2020-11-26 MED ORDER — ARMODAFINIL 250 MG PO TABS
ORAL_TABLET | ORAL | 5 refills | Status: DC
  Filled 2020-12-14: qty 30, 30d supply, fill #0
  Filled 2021-01-27: qty 30, 30d supply, fill #1
  Filled 2021-03-25: qty 30, 30d supply, fill #2
  Filled 2021-05-09: qty 30, 30d supply, fill #3

## 2020-11-26 MED ORDER — AMPHETAMINE-DEXTROAMPHET ER 25 MG PO CP24: ORAL_CAPSULE | ORAL | 0 refills | Status: DC

## 2020-11-29 ENCOUNTER — Other Ambulatory Visit
Admission: RE | Admit: 2020-11-29 | Discharge: 2020-11-29 | Disposition: A | Discharge status: Home / Self Care | Payer: 59 | Source: Ambulatory Visit | Attending: Psychiatry | Admitting: Psychiatry

## 2020-11-29 ENCOUNTER — Encounter: Payer: Self-pay | Admitting: Psychiatry

## 2020-11-29 ENCOUNTER — Ambulatory Visit (INDEPENDENT_AMBULATORY_CARE_PROVIDER_SITE_OTHER): Payer: 59 | Admitting: Psychiatry

## 2020-11-29 ENCOUNTER — Other Ambulatory Visit: Payer: Self-pay

## 2020-11-29 VITALS — BP 121/84 | HR 98 | Temp 97.2°F | Wt 200.8 lb

## 2020-11-29 DIAGNOSIS — F3176 Bipolar disorder, in full remission, most recent episode depressed: Secondary | ICD-10-CM | POA: Insufficient documentation

## 2020-11-29 DIAGNOSIS — Z5181 Encounter for therapeutic drug level monitoring: Secondary | ICD-10-CM | POA: Diagnosis present

## 2020-11-29 DIAGNOSIS — Z79899 Other long term (current) drug therapy: Secondary | ICD-10-CM | POA: Insufficient documentation

## 2020-11-29 DIAGNOSIS — F411 Generalized anxiety disorder: Secondary | ICD-10-CM | POA: Diagnosis not present

## 2020-11-29 LAB — VALPROIC ACID LEVEL: Valproic Acid Lvl: 62 ug/mL (ref 50.0–100.0)

## 2020-11-29 NOTE — Patient Instructions (Signed)
1. Continue Depakote 2000 mg at night  2. Check VPA level  3. Continue fluoxetine 60 mg daily  4. Continue lamotrigine 100 mg daily  5. Continue latuda 60 mg daily  6. Next appointment: 10/14 at 8 AM, video

## 2020-12-06 ENCOUNTER — Ambulatory Visit (INDEPENDENT_AMBULATORY_CARE_PROVIDER_SITE_OTHER): Payer: 59 | Admitting: Family Medicine

## 2020-12-06 ENCOUNTER — Encounter: Payer: Self-pay | Admitting: Family Medicine

## 2020-12-06 ENCOUNTER — Other Ambulatory Visit: Payer: Self-pay

## 2020-12-06 VITALS — BP 124/82 | HR 70 | Temp 98.6°F | Wt 197.8 lb

## 2020-12-06 DIAGNOSIS — R634 Abnormal weight loss: Secondary | ICD-10-CM | POA: Diagnosis not present

## 2020-12-06 DIAGNOSIS — R1111 Vomiting without nausea: Secondary | ICD-10-CM | POA: Diagnosis not present

## 2020-12-06 DIAGNOSIS — F3176 Bipolar disorder, in full remission, most recent episode depressed: Secondary | ICD-10-CM | POA: Diagnosis not present

## 2020-12-06 DIAGNOSIS — M533 Sacrococcygeal disorders, not elsewhere classified: Secondary | ICD-10-CM

## 2020-12-06 NOTE — Patient Instructions (Signed)
2 Tylenol 4 times per day and then take 2 Aleve twice per day.  Sit upright and use a pillow to take the pressure off the coccygeal area

## 2020-12-06 NOTE — Progress Notes (Signed)
   Subjective:    Patient ID: Ashley Franklin, female    DOB: [birth year], 50 y.o.   MRN: [id number]  HPI She is here for evaluation of continued difficulty with coccygeal pain.  She injured it on July 13 and did have x-rays which were negative.  Since then she states that it is actually gotten worse.  She would like further evaluation of this. At the end of the encounter as she then mentioned wanting to be referred to GI and on further questioning she has had a significant weight loss over the last several years and complains of vomiting several times per week as well as difficulty with constipation.  She now has food aversions but cannot state that any particular food causes the vomiting.  She does have underlying bipolar disorder And is in the process of being evaluated for possible narcolepsy.  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  Review of Systems     Objective:   Physical Exam Alert and in no distress.  Exam of her record shows that she has had a recent c-Met which was normal.  She also has had at least a 20 pound weight loss over the last year and even more over the last 2 years.       Assessment & Plan:  Coccygeal pain, acute - Plan: DG Sacrum/Coccyx  Bipolar disorder, in full remission, most recent episode depressed (HCC)  Vomiting without nausea, intractability of vomiting not specified, unspecified vomiting type - Plan: CBC with Differential/Platelet  Weight loss - Plan: CBC with Differential/Platelet A mixed bag of symptoms with vomiting, constipation, bipolar disorder and questionable narcolepsy.  GI referral is certainly reasonable. We will follow-up on the coccygeal pain after she gets an x-ray.

## 2020-12-07 LAB — CBC WITH DIFFERENTIAL/PLATELET
Basophils Absolute: 0 10*3/uL (ref 0.0–0.2)
Basos: 1 %
EOS (ABSOLUTE): 0.1 10*3/uL (ref 0.0–0.4)
Eos: 2 %
Hematocrit: 37.2 % (ref 34.0–46.6)
Hemoglobin: 12.3 g/dL (ref 11.1–15.9)
Immature Grans (Abs): 0 10*3/uL (ref 0.0–0.1)
Immature Granulocytes: 1 %
Lymphocytes Absolute: 1.6 10*3/uL (ref 0.7–3.1)
Lymphs: 27 %
MCH: 29.4 pg (ref 26.6–33.0)
MCHC: 33.1 g/dL (ref 31.5–35.7)
MCV: 89 fL (ref 79–97)
Monocytes Absolute: 0.5 10*3/uL (ref 0.1–0.9)
Monocytes: 9 %
Neutrophils Absolute: 3.6 10*3/uL (ref 1.4–7.0)
Neutrophils: 60 %
Platelets: 236 10*3/uL (ref 150–450)
RBC: 4.18 x10E6/uL (ref 3.77–5.28)
RDW: 14.7 % (ref 11.7–15.4)
WBC: 5.9 10*3/uL (ref 3.4–10.8)

## 2020-12-07 NOTE — Progress Notes (Signed)
The blood work is normal 

## 2020-12-08 ENCOUNTER — Other Ambulatory Visit: Payer: Self-pay

## 2020-12-08 ENCOUNTER — Ambulatory Visit (HOSPITAL_BASED_OUTPATIENT_CLINIC_OR_DEPARTMENT_OTHER)
Admission: RE | Admit: 2020-12-08 | Discharge: 2020-12-08 | Disposition: A | Discharge status: Home / Self Care | Payer: 59 | Source: Ambulatory Visit | Attending: Family Medicine | Admitting: Family Medicine

## 2020-12-08 DIAGNOSIS — M533 Sacrococcygeal disorders, not elsewhere classified: Secondary | ICD-10-CM | POA: Diagnosis not present

## 2020-12-10 ENCOUNTER — Other Ambulatory Visit (HOSPITAL_BASED_OUTPATIENT_CLINIC_OR_DEPARTMENT_OTHER): Payer: Self-pay

## 2020-12-10 MED ORDER — DICLOFENAC SODIUM 75 MG PO TBEC
75.0000 mg | DELAYED_RELEASE_TABLET | Freq: Two times a day (BID) | ORAL | 0 refills | Status: DC
  Filled 2020-12-10: qty 30, 15d supply, fill #0

## 2020-12-10 NOTE — Addendum Note (Signed)
Addended by: Ronnald Nian on: 12/10/2020 01:10 PM   Modules accepted: Orders

## 2020-12-10 NOTE — Progress Notes (Signed)
Lvm advising pt KH 

## 2020-12-15 ENCOUNTER — Other Ambulatory Visit (HOSPITAL_BASED_OUTPATIENT_CLINIC_OR_DEPARTMENT_OTHER): Payer: Self-pay

## 2020-12-17 ENCOUNTER — Other Ambulatory Visit (HOSPITAL_BASED_OUTPATIENT_CLINIC_OR_DEPARTMENT_OTHER): Payer: Self-pay

## 2020-12-20 ENCOUNTER — Other Ambulatory Visit (HOSPITAL_BASED_OUTPATIENT_CLINIC_OR_DEPARTMENT_OTHER): Payer: Self-pay

## 2020-12-20 MED ORDER — AMPHETAMINE-DEXTROAMPHET ER 25 MG PO CP24
ORAL_CAPSULE | ORAL | 0 refills | Status: DC
  Filled 2020-12-20: qty 30, 30d supply, fill #0

## 2020-12-21 ENCOUNTER — Encounter: Payer: Self-pay | Admitting: Family Medicine

## 2020-12-21 DIAGNOSIS — R1111 Vomiting without nausea: Secondary | ICD-10-CM

## 2021-01-03 ENCOUNTER — Telehealth: Payer: Self-pay

## 2021-01-03 NOTE — Telephone Encounter (Signed)
received message on [email or web address] website that prior Berkley Harvey was approved from 01-04-22  Ref # [id number]

## 2021-01-03 NOTE — Telephone Encounter (Signed)
[email or web address] had a request for prior auth for the lurasidone prior auth was submitted and is pending

## 2021-01-27 ENCOUNTER — Other Ambulatory Visit: Payer: Self-pay

## 2021-01-27 ENCOUNTER — Other Ambulatory Visit (HOSPITAL_BASED_OUTPATIENT_CLINIC_OR_DEPARTMENT_OTHER): Payer: Self-pay

## 2021-01-27 ENCOUNTER — Other Ambulatory Visit: Payer: Self-pay | Admitting: Family Medicine

## 2021-01-27 NOTE — Telephone Encounter (Signed)
Med cencter is requesting to fill pt diclofenac. Please advise [facility]

## 2021-01-28 ENCOUNTER — Other Ambulatory Visit (HOSPITAL_BASED_OUTPATIENT_CLINIC_OR_DEPARTMENT_OTHER): Payer: Self-pay

## 2021-01-28 MED ORDER — AMPHETAMINE-DEXTROAMPHET ER 25 MG PO CP24
ORAL_CAPSULE | ORAL | 0 refills | Status: DC
  Filled 2021-01-28: qty 30, 30d supply, fill #0

## 2021-01-31 ENCOUNTER — Other Ambulatory Visit: Payer: Self-pay | Admitting: Family Medicine

## 2021-01-31 ENCOUNTER — Other Ambulatory Visit (HOSPITAL_BASED_OUTPATIENT_CLINIC_OR_DEPARTMENT_OTHER): Payer: Self-pay

## 2021-01-31 NOTE — Telephone Encounter (Signed)
Med center is requesting to fill pt  voltaren. Please advise [facility]

## 2021-02-02 ENCOUNTER — Other Ambulatory Visit (HOSPITAL_BASED_OUTPATIENT_CLINIC_OR_DEPARTMENT_OTHER): Payer: Self-pay

## 2021-02-03 ENCOUNTER — Other Ambulatory Visit (HOSPITAL_BASED_OUTPATIENT_CLINIC_OR_DEPARTMENT_OTHER): Payer: Self-pay

## 2021-02-03 ENCOUNTER — Telehealth: Payer: 59 | Admitting: Physician Assistant

## 2021-02-03 DIAGNOSIS — U071 COVID-19: Secondary | ICD-10-CM

## 2021-02-03 MED ORDER — BENZONATATE 100 MG PO CAPS
100.0000 mg | ORAL_CAPSULE | Freq: Three times a day (TID) | ORAL | 0 refills | Status: DC | PRN
  Filled 2021-02-03: qty 30, 10d supply, fill #0

## 2021-02-03 MED ORDER — FLUTICASONE PROPIONATE 50 MCG/ACT NA SUSP
2.0000 | Freq: Every day | NASAL | 0 refills | Status: DC
  Filled 2021-02-03: qty 16, 30d supply, fill #0

## 2021-02-03 NOTE — Progress Notes (Signed)
  E-Visit  for Positive Covid Test Result  We are sorry you are not feeling well. We are here to help!  You have tested positive for COVID-19, meaning that you were infected with the novel coronavirus and could give the virus to others.  It is vitally important that you stay home so you do not spread it to others.      Please continue isolation at home, for at least 10 days since the start of your symptoms and until you have had 24 hours with no fever (without taking a fever reducer) and with improving of symptoms.  If you have no symptoms but tested positive (or all symptoms resolve after 5 days and you have no fever) you can leave your house but continue to wear a mask around others for an additional 5 days. If you have a fever,continue to stay home until you have had 24 hours of no fever. Most cases improve 5-10 days from onset but we have seen a small number of patients who have gotten worse after the 10 days.  Please be sure to watch for worsening symptoms and remain taking the proper precautions.   Go to the nearest hospital ED for assessment if fever/cough/breathlessness are severe or illness seems like a threat to life.    The following symptoms may appear 2-14 days after exposure: Fever Cough Shortness of breath or difficulty breathing Chills Repeated shaking with chills Muscle pain Headache Sore throat New loss of taste or smell Fatigue Congestion or runny nose Nausea or vomiting Diarrhea  You have been enrolled in MyChart Home Monitoring for COVID-19. Daily you will receive a questionnaire within the MyChart website. Our COVID-19 response team will be monitoring your responses daily.  You can use medication such as prescription cough medication called Tessalon Perles 100 mg. You may take 1-2 capsules every 8 hours as needed for cough and prescription for Fluticasone nasal spray 2 sprays in each nostril one time per day  You may also take acetaminophen (Tylenol) as needed  for fever.  HOME CARE: Only take medications as instructed by your medical team. Drink plenty of fluids and get plenty of rest. A steam or ultrasonic humidifier can help if you have congestion.   GET HELP RIGHT AWAY IF YOU HAVE EMERGENCY WARNING SIGNS.  Call 911 or proceed to your closest emergency facility if: You develop worsening high fever. Trouble breathing Bluish lips or face Persistent pain or pressure in the chest New confusion Inability to wake or stay awake You cough up blood. Your symptoms become more severe Inability to hold down food or fluids  This list is not all possible symptoms. Contact your medical provider for any symptoms that are severe or concerning to you.    Your e-visit answers were reviewed by a board certified advanced clinical practitioner to complete your personal care plan.  Depending on the condition, your plan could have included both over the counter or prescription medications.  If there is a problem please reply once you have received a response from your provider.  Your safety is important to us.  If you have drug allergies check your prescription carefully.    You can use MyChart to ask questions about today's visit, request a non-urgent call back, or ask for a work or school excuse for 24 hours related to this e-Visit. If it has been greater than 24 hours you will need to follow up with your provider, or enter a new e-Visit to address those   concerns. You will get an e-mail in the next two days asking about your experience.  I hope that your e-visit has been valuable and will speed your recovery. Thank you for using e-visits.  I provided 5 minutes of non face-to-face time during this encounter for chart review and documentation.   

## 2021-02-04 ENCOUNTER — Telehealth: Payer: Self-pay

## 2021-02-04 ENCOUNTER — Other Ambulatory Visit: Payer: Self-pay

## 2021-02-04 ENCOUNTER — Telehealth: Payer: 59 | Admitting: Family Medicine

## 2021-02-04 ENCOUNTER — Encounter: Payer: Self-pay | Admitting: Family Medicine

## 2021-02-04 DIAGNOSIS — U071 COVID-19: Secondary | ICD-10-CM | POA: Diagnosis not present

## 2021-02-04 NOTE — Progress Notes (Signed)
   Subjective:  Documentation for virtual audio and video telecommunications through Caregility encounter:  The patient was located at home. 2 patient identifiers used.  The provider was located in the office. The patient did consent to this visit and is aware of possible charges through their insurance for this visit.  The other persons participating in this telemedicine service were none. Time spent on call was 14 minutes and in review of previous records 18 minutes total.  This virtual service is not related to other E/M service within previous 7 days.   Patient ID: Ashley Franklin, female    DOB: [birth year], 50 y.o.   MRN: [id number]  HPI Chief Complaint  Patient presents with   Covid Positive    Positive home test yesterday 02/03/21   Cough    Scratchy throat, body aches, head congestion, sneezing started Wednesday. Denies fever/SHOB   Complains of a 3 day history of sore throat, headache, body aches, and nasal congestion.  She is also sneezing.  Denies fever, chills, dizziness, chest pain, palpitations, shortness of breath, abdominal pain, nausea, vomiting or diarrhea.  States she tested positive for Covid yesterday.   Covid vaccines -reports having 3 of the vaccines.  She is immunocompetent    Review of Systems Pertinent positives and negatives in the history of present illness.     Objective:   Physical Exam There were no vitals taken for this visit.  Alert and oriented in no acute distress.  Respirations unlabored.  Speaking in complete sentences without difficulty.      Assessment & Plan:  COVID-19 virus infection Discussed symptomatic treatment and reviewed notes from her ED visit yesterday.  She does have a steroid nasal spray and Tessalon Perles at the pharmacy.  We also discussed Mucinex if needed.  Recommend multivitamin, hydration and rest.  Discussed quarantine.  Advised her to seek immediate medical attention if she develops significant shortness of  breath or any other worrisome symptoms.

## 2021-02-04 NOTE — Telephone Encounter (Signed)
Patient's Spouse, Gregary Signs, called [facility] today wanting to know if patient could be reconsidered for care at [facility] with another provider since her dismissal from all [facility] Department in January 2021.  I advised him dismissals aren't overturned and are carefully considered before being put into motion and carried out.  I advised him to speak to the patient about the dismissal and the details surrounding it. He stated he did not understand why he could not talk to Dr. Drue Novel and things could not be worked out so patient could be seen by another provider here other than Dr. Drue Novel.  I stated it was because the dismissal included the entire department.  He stated well what about [place].  I advised [facility] is not a [facility]film[facility]video Care location, so she could seek care there. He continued to ask questions regarding time frames on dismissals and how long they last and I repeatedly stated indefinitely.  He stated patient is not getting the same care where she is now like she previously received at [facility].  He stated patient has/had Covid and they would not give her Paxlovid at the facility where she was being treated at.  The call ended with him wanting my first and last name which I gave to him and he stated he would be reaching out to someone else in [facility] for further action regarding help with reversing this decision.

## 2021-02-14 ENCOUNTER — Other Ambulatory Visit (HOSPITAL_BASED_OUTPATIENT_CLINIC_OR_DEPARTMENT_OTHER): Payer: Self-pay

## 2021-02-19 NOTE — Progress Notes (Signed)
Virtual Visit via Video Note  I connected with Ashley Franklin on 02/25/21 at  8:00 AM EDT by a video enabled telemedicine application and verified that I am speaking with the correct person using two identifiers.  Location: Patient: home Provider: office Persons participated in the visit- patient, provider    I discussed the limitations of evaluation and management by telemedicine and the availability of in person appointments. The patient expressed understanding and agreed to proceed.    I discussed the assessment and treatment plan with the patient. The patient was provided an opportunity to ask questions and all were answered. The patient agreed with the plan and demonstrated an understanding of the instructions.   The patient was advised to call back or seek an in-person evaluation if the symptoms worsen or if the condition fails to improve as anticipated.  I provided 19 minutes of non-face-to-face time during this encounter.   Ashley Hotter, MD    [facility] MD/PA/NP OP Progress Note  02/25/2021 8:28 AM Ashley Franklin  MRN:  [id number]  Chief Complaint:  Chief Complaint   Follow-up; Other    HPI:  This is a follow-up appointment for bipolar disorder and then anxiety.  She states that she has been doing well.  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  She feels good about this.  She also reports good support from her husband and her work.  She notices that she started to have middle insomnia for the past month.  She tends to pick finger, and has occasional palpitation with anxiety.  She feels that her motivation can go from 0-100.  She attributes this to her concern of not keeping enough.  She is planning to have 1/2-day off on Friday, which will be helpful for her to do things.  She is open to try therapy if it is approved.  She denies feeling depressed or anhedonia.  She has fair concentration.  She has been  losing 68 pounds over the past year; she is planning to find a new PCP and to evaluate this.  She denies SI.  She denies decreased need for sleep or euphonia.  She denies increased goal-directed activity.  She feels comfortable to stay on the medication as it is.   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 her daughter obtained PHD in pharmacy, her son bought a house  Visit Diagnosis:    ICD-10-CM   1. Anxiety state  F41.1     2. Bipolar disorder, in full remission, most recent episode depressed (HCC)  F31.76 FLUoxetine (PROZAC) 20 MG tablet      Past Psychiatric History: Please see initial evaluation for full details. I have reviewed the history. No updates at this time.     Past Medical History:  Past Medical History:  Diagnosis Date   Anemia    Anxiety    Bipolar affective disorder (HCC)    Blood transfusion without reported diagnosis    Bronchospasm with bronchitis, acute    Depression    Kidney stone    Right carpal tunnel syndrome 12/09/2018   SBO (small bowel obstruction) (HCC) 01/2014   Sleep apnea     Past Surgical History:  Procedure Laterality Date   CARPAL TUNNEL RELEASE Right 08/05/2019   Procedure: RIGHT CARPAL TUNNEL RELEASE;  Surgeon: Cindee Salt, MD;  Location: [facility];  Service: Orthopedics;  Laterality: Right;  IV REGIONAL FOREARM BLOCK   CESAREAN SECTION     ENDOMETRIAL ABLATION     GASTRIC BYPASS  2001   SBO  2015    Family Psychiatric History: Please see initial evaluation for full details. I have reviewed the history. No updates at this time.     Family History:  Family History  Problem Relation Age of Onset   Diabetes Mother    Cancer Mother 17       Throat    Bipolar disorder Mother    Bipolar disorder Sister    Bipolar disorder Brother    Drug abuse Brother    Bipolar  disorder Sister    Colon cancer Neg Hx    Cancer - Prostate Neg Hx    Breast cancer Neg Hx     Social History:  Social History   Socioeconomic History   Marital status: Married    Spouse name: Not on file   Number of children: 2   Years of education: Not on file   Highest education level: Not on file  Occupational History   Occupation: phelbotomist    Employer: [employer]  Tobacco Use   Smoking status: Former    Packs/day: 0.25    Years: 6.00    Pack years: 1.50    Types: Cigarettes   Smokeless tobacco: Never   Tobacco comments:    1/2 ppd   Vaping Use   Vaping Use: Never used  Substance and Sexual Activity   Alcohol use: Yes    Alcohol/week: 0.0 standard drinks    Comment: Two times a week.    Drug use: No   Sexual activity: Yes    Partners: Male    Birth control/protection: Surgical  Other Topics Concern   Not on file  Social History Narrative   The patient is a phlebotomist at [employer]    1 son born 1988-08-13 daughter born in 61 she is married and lives with her husband   Prior smoker not current 3 caffeinated beverages a day no alcohol tobacco or drug use      2 independent Acupuncturist schools, music teacher   Right handed    Caffeine 2 cups daily    Social Determinants of Health   Financial Resource Strain: Not on file  Food Insecurity: Not on file  Transportation Needs: Not on file  Physical Activity: Not on file  Stress: Not on file  Social Connections: Not on file    Allergies:  Allergies  Allergen Reactions   Nsaids Other (See Comments)    Contraindicated with gastric bypass    Metabolic Disorder Labs: Lab Results  Component Value Date   HGBA1C 4.9 02/06/2017   MPG 105 02/26/2015   No results found for: PROLACTIN Lab Results  Component Value Date   CHOL 165 02/06/2017   TRIG 87 02/06/2017   HDL 54 02/06/2017   LDLCALC 94 02/06/2017   Lab Results  Component Value Date   TSH 1.12 07/10/2017   TSH 1.22  06/08/2016    Therapeutic Level Labs: No results found for: LITHIUM Lab Results  Component Value Date   VALPROATE 62 11/29/2020   VALPROATE 67 12/18/2019   No components found for:  CBMZ  Current Medications: Current Outpatient Medications  Medication Sig Dispense Refill   amphetamine-dextroamphetamine (ADDERALL XR) 25 MG 24 hr capsule TAKE 1 CAPSULE (25 MG TOTAL)  BY MOUTH EVERY MORNING. 30 capsule 0   amphetamine-dextroamphetamine (ADDERALL XR) 25 MG 24 hr capsule TAKE 1 CAPSULE (25 MG TOTAL) BY MOUTH EVERY MORNING. 30 capsule 0   amphetamine-dextroamphetamine (ADDERALL XR) 25 MG 24 hr capsule TAKE 1 CAPSULE (25 MG TOTAL) BY MOUTH EVERY MORNING. 30 capsule 0   Armodafinil 250 MG tablet Take 1 tablet (250 mg total) by mouth daily 30 tablet 5   benzonatate (TESSALON) 100 MG capsule Take 1 capsule (100 mg total) by mouth 3 (three) times daily as needed. (Patient not taking: Reported on 02/04/2021) 30 capsule 0   diclofenac (VOLTAREN) 75 MG EC tablet Take 1 tablet (75 mg total) by mouth 2 (two) times daily. 30 tablet 0   [START ON 03/29/2021] divalproex (DEPAKOTE ER) 500 MG 24 hr tablet Take 4 tablets (2,000 mg total) by mouth at bedtime. 360 tablet 1   [START ON 03/31/2021] FLUoxetine (PROZAC) 20 MG tablet Take 3 tablets (60 mg total) by mouth daily. 270 tablet 1   fluticasone (FLONASE) 50 MCG/ACT nasal spray Place 2 sprays into both nostrils daily. (Patient not taking: Reported on 02/04/2021) 16 g 0   lamoTRIgine (LAMICTAL) 100 MG tablet Take 1 tablet (100 mg total) by mouth daily. 90 tablet 1   [START ON 03/29/2021] Lurasidone HCl 60 MG TABS Take 1 tablet (60 mg total) by mouth daily. 90 tablet 1   No current facility-administered medications for this visit.     Musculoskeletal: Strength & Muscle Tone:  N/A Gait & Station:  N/A Patient leans: N/A  Psychiatric Specialty Exam: Review of Systems  Psychiatric/Behavioral:  Positive for sleep disturbance. Negative for agitation,  behavioral problems, confusion, decreased concentration, dysphoric mood, hallucinations, self-injury and suicidal ideas. The patient is nervous/anxious. The patient is not hyperactive.   All other systems reviewed and are negative.  There were no vitals taken for this visit.There is no height or weight on file to calculate BMI.  General Appearance: Fairly Groomed  Eye Contact:  Good  Speech:  Clear and Coherent  Volume:  Normal  Mood:   good  Affect:  Appropriate, Congruent, and Full Range  Thought Process:  Coherent  Orientation:  Full (Time, Place, and Person)  Thought Content: Logical   Suicidal Thoughts:  No  Homicidal Thoughts:  No  Memory:  Immediate;   Good  Judgement:  Good  Insight:  Good  Psychomotor Activity:  Normal  Concentration:  Concentration: Good and Attention Span: Good  Recall:  Good  Fund of Knowledge: Good  Language: Good  Akathisia:  No  Handed:  Right  AIMS (if indicated): not done  Assets:  Communication Skills Desire for Improvement  ADL's:  Intact  Cognition: WNL  Sleep:  Poor   Screenings: PHQ2-9    Flowsheet Row Video Visit from 02/25/2021 in [facility] Office Visit from 11/29/2020 in [facility] Video Visit from 08/31/2020 in [facility] Office Visit from 07/31/2019 in [facility] from 03/06/2017 in [facility]  PHQ-2 Total Score 0 1 0 0 0      Flowsheet Row Video Visit from 02/25/2021 in [facility] Office Visit from 11/29/2020 in [facility] ED from 11/24/2020 in [facility]  C-SSRS RISK CATEGORY No Risk Error: Question 6 not populated Error: Question 6 not populated        Assessment and Plan:  LYAN HOLCK is a 50 y.o. year old female  with a history of bipolar disorder, anxiety, anemia, obesity s/p gastric bypass  surgery, who presents for follow up appointment for below.   1. Bipolar disorder, in full remission, most recent episode depressed (HCC) 2. Anxiety state She reports slight worsening in anxiety and middle insomnia in the context of starting college.  However, she has been able to manage things relatively well, and enjoys work and college.  She is planning to have 1/2-day off, which will be helpful for her in her pressure to do things.  She is also open that she may try seeing a therapist if she is able to have this half day off.  Will not make changing her medication given this potential change.  Will continue Depakote to target bipolar disorder.  Will continue fluoxetine to target depression and anxiety.  Will continue lamotrigine and Latuda for bipolar disorder.     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 Currently on adderall and armodafinil.  She has a follow up appointment.   This clinician has discussed the side effect associated with medication prescribed during this encounter. Please refer to notes in the previous encounters for more details.     Plan:  I have reviewed and updated plans as below  1. Continue Depakote 2000 mg at night  (CMP, Plt normal 09/2020, VPA>60 in July 2022) 3. Continue fluoxetine 60 mg daily  4. Continue lamotrigine 100 mg daily  5. Continue latuda 60 mg daily - monitor rigidity on left arm 6. Next appointment: 12/16 at 8 AM for 30 mins, video  [email or web address] - on Adderall XR 25 mg daily, armodafinil for narcolepsy   Past trials of medication: Lexapro, Paxil, amitriptyline, Abilify (irritable), oxcarbazepine, perphenazine, Trifluoperazine, Klonopin, Valium. Vistaril, Ambien     The patient demonstrates the following  risk factors for suicide: Chronic risk factors for suicide include psychiatric disorder /bipolar disorder, previous self-harm of scratching herself. Acute risk factors for suicide include none. Protective factors for  this patient include positive social support, positive therapeutic relationship, hope for the future. Considering these factors, the overall suicide risk at this point appears to be low. Patient does have gun access at home and agrees to lock guns. Discussed in detail safety plan that anytime having active suicidal thoughts or homicidal thoughts and she need to call 911 or go to local emergency room.     Ashley Hotter, MD 02/25/2021, 8:28 AM

## 2021-02-25 ENCOUNTER — Encounter: Payer: Self-pay | Admitting: Psychiatry

## 2021-02-25 ENCOUNTER — Other Ambulatory Visit: Payer: Self-pay

## 2021-02-25 ENCOUNTER — Telehealth (INDEPENDENT_AMBULATORY_CARE_PROVIDER_SITE_OTHER): Payer: 59 | Admitting: Psychiatry

## 2021-02-25 ENCOUNTER — Other Ambulatory Visit (HOSPITAL_BASED_OUTPATIENT_CLINIC_OR_DEPARTMENT_OTHER): Payer: Self-pay

## 2021-02-25 DIAGNOSIS — F3176 Bipolar disorder, in full remission, most recent episode depressed: Secondary | ICD-10-CM

## 2021-02-25 DIAGNOSIS — F411 Generalized anxiety disorder: Secondary | ICD-10-CM | POA: Diagnosis not present

## 2021-02-25 MED ORDER — LURASIDONE HCL 60 MG PO TABS
60.0000 mg | ORAL_TABLET | Freq: Every day | ORAL | 1 refills | Status: DC
  Filled 2021-02-25 – 2021-03-25 (×2): qty 30, 30d supply, fill #0
  Filled 2021-05-09 – 2021-06-01 (×2): qty 30, 30d supply, fill #1
  Filled 2021-08-04: qty 30, 30d supply, fill #2

## 2021-02-25 MED ORDER — FLUOXETINE HCL 20 MG PO TABS
60.0000 mg | ORAL_TABLET | Freq: Every day | ORAL | 1 refills | Status: DC
  Filled 2021-02-25 – 2021-03-25 (×2): qty 90, 30d supply, fill #0
  Filled 2021-05-09 – 2021-06-01 (×2): qty 90, 30d supply, fill #1

## 2021-02-25 MED ORDER — DIVALPROEX SODIUM ER 500 MG PO TB24
2000.0000 mg | ORAL_TABLET | Freq: Every day | ORAL | 1 refills | Status: DC
  Filled 2021-02-25 – 2021-03-25 (×2): qty 120, 30d supply, fill #0
  Filled 2021-05-09 – 2021-06-01 (×2): qty 120, 30d supply, fill #1
  Filled 2021-08-04: qty 120, 30d supply, fill #2

## 2021-02-25 NOTE — Patient Instructions (Signed)
1. Continue Depakote 2000 mg at night  3. Continue fluoxetine 60 mg daily  4. Continue lamotrigine 100 mg daily  5. Continue latuda 60 mg daily - monitor rigidity on left arm 6. Next appointment: 12/16 at 8 AM

## 2021-03-25 ENCOUNTER — Other Ambulatory Visit (HOSPITAL_BASED_OUTPATIENT_CLINIC_OR_DEPARTMENT_OTHER): Payer: Self-pay

## 2021-03-25 MED ORDER — AMPHETAMINE-DEXTROAMPHET ER 25 MG PO CP24
25.0000 mg | ORAL_CAPSULE | Freq: Every morning | ORAL | 0 refills | Status: DC
  Filled 2021-03-25: qty 30, 30d supply, fill #0

## 2021-04-18 ENCOUNTER — Telehealth: Payer: Self-pay

## 2021-04-18 NOTE — Telephone Encounter (Signed)
pt called states that she needs something.  (xanax) or something.  she states that her husband told her to leave and now she having to stay at her parents house.

## 2021-04-18 NOTE — Telephone Encounter (Signed)
Could you ask her if she is interested in taking hydroxyzine 25 mg daily as needed for anxiety? Side effect including drowsiness. Please also advise her to have sooner appointment. I can see her this Wed in available slot if it works for her.

## 2021-04-22 NOTE — Telephone Encounter (Signed)
i have left two message so far did you send in medication if so i can just leave a another message that rx is being sent to pharmacy.

## 2021-04-25 ENCOUNTER — Other Ambulatory Visit (HOSPITAL_BASED_OUTPATIENT_CLINIC_OR_DEPARTMENT_OTHER): Payer: Self-pay

## 2021-04-25 ENCOUNTER — Other Ambulatory Visit: Payer: Self-pay | Admitting: Psychiatry

## 2021-04-25 MED ORDER — HYDROXYZINE HCL 25 MG PO TABS
25.0000 mg | ORAL_TABLET | Freq: Every day | ORAL | 0 refills | Status: DC | PRN
  Filled 2021-04-25: qty 30, 30d supply, fill #0

## 2021-04-25 NOTE — Progress Notes (Signed)
Virtual Visit via Video Note  I connected with Ashley Franklin on 04/29/21 at  8:00 AM EST by a video enabled telemedicine application and verified that I am speaking with the correct person using two identifiers.  Location: Patient: home Provider: office Persons participated in the visit- patient, provider    I discussed the limitations of evaluation and management by telemedicine and the availability of in person appointments. The patient expressed understanding and agreed to proceed.   I discussed the assessment and treatment plan with the patient. The patient was provided an opportunity to ask questions and all were answered. The patient agreed with the plan and demonstrated an understanding of the instructions.   The patient was advised to call back or seek an in-person evaluation if the symptoms worsen or if the condition fails to improve as anticipated.  I provided 21 minutes of non-face-to-face time during this encounter.   [name], MD    [facility] MD/PA/NP OP Progress Note  04/29/2021 9:09 AM Ashley Franklin  MRN:  [id number]  Chief Complaint:  Chief Complaint   Follow-up; Other    HPI:  This is a follow-up appointment for bipolar disorder.  She states that her husband asked her to leave.  It was building, and there were more than 20 times he asked her to leave over the past 10 years.  She is planning to leave by the end of the year.  Her father is helping in this process.  She states that he has been controlling.  He told her that it is stupid to go to college.  She has plenty of emotion including feeling scared, nervous and sad. She also feels terrified just because of the financial reason. She has estranged relationship with her daughter.  She also has conflict with her son since she brought up her concern about his alcohol use, and being dropped out from college.  She feels like he is not like a "buddy" as he was before.  She has been able to focus well at work despite  the stress. She was able to finish first semester at school.  She sleeps well.  She feels dread and fatigue.  She continues to have appetite loss, and will undergo endoscopy by GI provider.  She feels anxious , and takes hydroxyzine for this.  She denies SI.  She denies decreased need for sleep or euphonia.  She denies increased goal-directed activity.  She drinks mixed drinks only occasionally.  She denies drug use.  She is willing to try higher dose of lamotrigine.    Visit Diagnosis:    ICD-10-CM   1. Bipolar affective disorder, currently depressed, mild (HCC)  F31.31 Valproic acid level    CBC    Comprehensive metabolic panel    2. Anxiety state  F41.1       Past Psychiatric History: Please see initial evaluation for full details. I have reviewed the history. No updates at this time.     Past Medical History:  Past Medical History:  Diagnosis Date   Anemia    Anxiety    Bipolar affective disorder (HCC)    Blood transfusion without reported diagnosis    Bronchospasm with bronchitis, acute    Depression    Diabetes (HCC)    diet controlled   Kidney stone    Obesity    Right carpal tunnel syndrome 12/09/2018   SBO (small bowel obstruction) (HCC) 01/2014   Sleep apnea    uses CPAP    Past  Surgical History:  Procedure Laterality Date   CARPAL TUNNEL RELEASE Right 08/05/2019   Procedure: RIGHT CARPAL TUNNEL RELEASE;  Surgeon: Cindee Salt, MD;  Location: [facility];  Service: Orthopedics;  Laterality: Right;  IV REGIONAL FOREARM BLOCK   CESAREAN SECTION  1994   ENDOMETRIAL ABLATION     GASTRIC BYPASS  2001   KIDNEY STONE SURGERY  2010   SBO  2015    Family Psychiatric History: Please see initial evaluation for full details. I have reviewed the history. No updates at this time.     Family History:  Family History  Problem Relation Age of Onset   Diabetes Mother    Cancer Mother 55       Throat    Bipolar disorder Mother    Kidney disease Mother     Heart disease Father    Bipolar disorder Sister    Bipolar disorder Sister    Bipolar disorder Brother    Drug abuse Brother    Kidney disease Maternal Grandmother    Heart disease Paternal Grandfather    Colon cancer Maternal Aunt    Cancer - Prostate Neg Hx    Breast cancer Neg Hx     Social History:  Social History   Socioeconomic History   Marital status: Married    Spouse name: Not on file   Number of children: 2   Years of education: Not on file   Highest education level: Not on file  Occupational History   Occupation: office administor  Tobacco Use   Smoking status: Former    Packs/day: 0.25    Years: 6.00    Pack years: 1.50    Types: Cigarettes   Smokeless tobacco: Never   Tobacco comments:    1/2 ppd   Vaping Use   Vaping Use: Never used  Substance and Sexual Activity   Alcohol use: Yes    Alcohol/week: 0.0 standard drinks    Comment: Two times a week.    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, music teacher   Right handed    Caffeine 2 cups daily    Social Determinants of Health   Financial Resource Strain: Not on file  Food Insecurity: Not on file  Transportation Needs: Not on file  Physical Activity: Not on file  Stress: Not on file  Social Connections: Not on file    Allergies:  Allergies  Allergen Reactions   Nsaids Other (See Comments)    Contraindicated with gastric bypass    Metabolic Disorder Labs: Lab Results  Component Value Date   HGBA1C 4.9 02/06/2017   MPG 105 02/26/2015   No results found for: PROLACTIN Lab Results  Component Value Date   CHOL 165 02/06/2017   TRIG 87  02/06/2017   HDL 54 02/06/2017   LDLCALC 94 02/06/2017   Lab Results  Component Value Date   TSH 1.12 07/10/2017   TSH 1.22 06/08/2016    Therapeutic Level Labs: No results found for: LITHIUM Lab Results  Component Value Date   VALPROATE 62  11/29/2020   VALPROATE 67 12/18/2019   No components found for:  CBMZ  Current Medications: Current Outpatient Medications  Medication Sig Dispense Refill   lamoTRIgine (LAMICTAL) 150 MG tablet Take 1 tablet (150 mg total) by mouth daily. 90 tablet 0   AMBULATORY NON FORMULARY MEDICATION nitroglycerin 0.125% gel . apply a pea size amount to your rectum three times daily x 6-8 weeks. 30 g 0   amphetamine-dextroamphetamine (ADDERALL XR) 25 MG 24 hr capsule Take 1 capsule by mouth every morning. 30 capsule 0   Armodafinil 250 MG tablet Take 1 tablet (250 mg total) by mouth daily 30 tablet 5   divalproex (DEPAKOTE ER) 500 MG 24 hr tablet Take 4 tablets (2,000 mg total) by mouth at bedtime. 360 tablet 1   FLUoxetine (PROZAC) 20 MG tablet Take 3 tablets (60 mg total) by mouth daily. 270 tablet 1   hydrOXYzine (ATARAX) 25 MG tablet Take 1 tablet (25 mg total) by mouth daily as needed for anxiety. 30 tablet 0   lubiprostone (AMITIZA) 24 MCG capsule Take 1 capsule (24 mcg total) by mouth 2 (two) times daily. 120 capsule 3   Lurasidone HCl 60 MG TABS Take 1 tablet (60 mg total) by mouth daily. 90 tablet 1   No current facility-administered medications for this visit.     Musculoskeletal: Strength & Muscle Tone:  N/A Gait & Station:  N/A Patient leans: N/A  Psychiatric Specialty Exam: Review of Systems  Psychiatric/Behavioral:  Positive for dysphoric mood. Negative for agitation, behavioral problems, confusion, decreased concentration, hallucinations, self-injury, sleep disturbance and suicidal ideas. The patient is nervous/anxious. The patient is not hyperactive.   All other systems reviewed and are negative.  There were no vitals taken for this  visit.There is no height or weight on file to calculate BMI.  General Appearance: Fairly Groomed  Eye Contact:  Good  Speech:  Clear and Coherent  Volume:  Normal  Mood:  Depressed  Affect:  Appropriate, Congruent, and Tearful  Thought Process:  Coherent  Orientation:  Full (Time, Place, and Person)  Thought Content: Logical   Suicidal Thoughts:  No  Homicidal Thoughts:  No  Memory:  Immediate;   Good  Judgement:  Good  Insight:  Good  Psychomotor Activity:  Normal  Concentration:  Concentration: Good and Attention Span: Good  Recall:  Good  Fund of Knowledge: Good  Language: Good  Akathisia:  No  Handed:  Right  AIMS (if indicated): not done  Assets:  Communication Skills Desire for Improvement  ADL's:  Intact  Cognition: WNL  Sleep:  Good   Screenings: PHQ2-9    Flowsheet Row Video Visit from 02/25/2021 in [facility] Office Visit from 11/29/2020 in [facility] Video Visit from 08/31/2020 in [facility] Office Visit from 07/31/2019 in [facility] from 03/06/2017 in [facility]  PHQ-2 Total Score 0 1 0 0 0      Flowsheet Row Video Visit from 02/25/2021 in [facility] Office Visit from 11/29/2020 in [facility] ED from 11/24/2020 in [facility]  C-SSRS RISK CATEGORY No Risk Error: Question 6 not populated Error: Question 6 not populated        Assessment and Plan:  Ashley Franklin is a 50 y.o. year old female with a history of  bipolar disorder, anxiety, anemia, obesity s/p gastric bypass surgery, who presents for follow up appointment for below.  1. Bipolar affective disorder, currently depressed, mild (HCC) 2. Anxiety state There has been worsening in depressive symptoms in the context of marital conflict.  Other psychosocial stressors includes  estranged relationship with her daughter, and conflict with her son.  Will uptitrate lamotrigine to target bipolar depression.  Discussed potential risk of Stevens-Johnson syndrome.  Will continue Depakote and Latuda for bipolar disorder.  Will continue fluoxetine to target depression and anxiety.     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 Currently on adderall and armodafinil.  She has a follow up appointment.   This clinician has discussed the side effect associated with medication prescribed during this encounter. Please refer to notes in the previous encounters for more details.    Plan:   Continue Depakote 2000 mg at night  Obtain labs (CBC, CMP, VPA in Jan) Continue fluoxetine 60 mg daily  Increase lamotrigine 150 mg daily  Continue latuda 60 mg daily - monitor rigidity on left arm Next appointment: 1/20 at 10:30 for 30 mins, video  [email or web address] (CMP, Plt normal 09/2020, VPA>60 in July 2022) - on Adderall XR 25 mg daily, armodafinil for narcolepsy   Past trials of medication: Lexapro, Paxil, amitriptyline, Abilify (irritable), oxcarbazepine, perphenazine, Trifluoperazine, Klonopin, Valium. Vistaril, Ambien     The patient demonstrates the following  risk factors for suicide: Chronic risk factors for suicide include psychiatric disorder /bipolar disorder, previous self-harm of scratching herself. Acute risk factors for suicide include none. Protective factors for this patient include positive social support, positive therapeutic relationship, hope for the future. Considering these factors, the overall suicide risk at this point appears to be low. Patient does have gun access at home and agrees to lock guns. Discussed in detail safety plan that anytime having active suicidal thoughts or homicidal thoughts and she need to call 911 or go to local emergency room.     [name], MD 04/29/2021, 9:09 AM

## 2021-04-25 NOTE — Telephone Encounter (Signed)
Ordered hydroxyzine 25 mg daily as needed for anxiety. Please inform her to pick up this medication only if she is interested.  Side effect includes drowsiness.  Please avoid taking this medication before driving.

## 2021-04-25 NOTE — Telephone Encounter (Signed)
ro

## 2021-04-27 ENCOUNTER — Encounter: Payer: Self-pay | Admitting: Gastroenterology

## 2021-04-27 ENCOUNTER — Other Ambulatory Visit (HOSPITAL_BASED_OUTPATIENT_CLINIC_OR_DEPARTMENT_OTHER): Payer: Self-pay

## 2021-04-27 ENCOUNTER — Ambulatory Visit (INDEPENDENT_AMBULATORY_CARE_PROVIDER_SITE_OTHER): Payer: 59 | Admitting: Gastroenterology

## 2021-04-27 VITALS — BP 110/70 | HR 93 | Ht 64.0 in | Wt 191.0 lb

## 2021-04-27 DIAGNOSIS — K5900 Constipation, unspecified: Secondary | ICD-10-CM

## 2021-04-27 DIAGNOSIS — K921 Melena: Secondary | ICD-10-CM | POA: Diagnosis not present

## 2021-04-27 DIAGNOSIS — R1111 Vomiting without nausea: Secondary | ICD-10-CM | POA: Diagnosis not present

## 2021-04-27 DIAGNOSIS — R198 Other specified symptoms and signs involving the digestive system and abdomen: Secondary | ICD-10-CM | POA: Diagnosis not present

## 2021-04-27 MED ORDER — LUBIPROSTONE 24 MCG PO CAPS
24.0000 ug | ORAL_CAPSULE | Freq: Two times a day (BID) | ORAL | 3 refills | Status: AC
  Filled 2021-04-27 – 2021-06-01 (×3): qty 60, 30d supply, fill #0

## 2021-04-27 MED ORDER — AMBULATORY NON FORMULARY MEDICATION
0 refills | Status: DC
Start: 2021-04-27 — End: 2021-11-08

## 2021-04-27 NOTE — Patient Instructions (Signed)
If you are age 48 or older, your body mass index should be between 23-30. Your Body mass index is 32.79 kg/m. If this is out of the aforementioned range listed, please consider follow up with your Primary Care Provider.  If you are age 35 or younger, your body mass index should be between 19-25. Your Body mass index is 32.79 kg/m. If this is out of the aformentioned range listed, please consider follow up with your Primary Care Provider.  You have been scheduled for an endoscopy. Please follow written instructions given to you at your visit today. If you use inhalers (even only as needed), please bring them with you on the day of your procedure.    We have sent the following medications to your pharmacy for you to pick up at your convenience: Amitiza 24 mcg.  We have sent a prescription for nitroglycerin 0.125% gel to [facility]. You should apply a pea size amount to your rectum three times daily x 6-8 weeks.  [facility]'s information is below: Address: [address] [place], [place] [address]  [phone]  *Please DO NOT go directly from our office to pick up this medication! Give the pharmacy 1 day to process the prescription as this is compounded and takes time to make.   The  GI providers would like to encourage you to use [facility] to communicate with providers for non-urgent requests or questions.  Due to long hold times on the telephone, sending your provider a message by [facility] may be a faster and more efficient way to get a response.  Please allow 48 business hours for a response.  Please remember that this is for non-urgent requests.   It was a pleasure to see you today!  Thank you for trusting me with your gastrointestinal care!    Scott E.Tomasa Rand , MD

## 2021-04-27 NOTE — Progress Notes (Signed)
HPI : Ashley Franklin is a very pleasant 50 year old female with a history of Roux-en-Y gastric bypass in 2002 who is referred to [place] by Dr. Sharlot Gowda for further evaluation of recurrent vomiting episodes, constipation and hematochezia.  The patient has also had unintentional weight loss.  The patient reports that she has recurrent episodes of "instantaneous" vomiting.  These episodes seem to come out of the blue and her not typically preceded by nausea.  These episodes occur sometimes on empty stomach and sometimes while she is eating.  Episodes can be as frequent as 3 times a day, and then go several days with no episodes.  She does report symptoms of early satiety not being able to eat as much as he used to.  She denies problems with heartburn or acid regurgitation, but does report having a "gurgling in her chest" sometimes after meals.  She denies being woken up by any reflux or vomiting episodes.  She has had significant weight loss in the past year without meaning to.  The patient's weight had been stable at around 260 pounds in 2020 and early 2021.  From April 2021 to May 2022, she lost 50 pounds.  Since May, she has lost an additional 10 pounds.  She denies any conscious efforts to lose weight.  She does report that she has developed aversion to many foods that she used to enjoy.  Currently she says she cannot stand the texture or taste of any meats.  She currently eats "lots of popsicles and cottage cheese". She is also bothered by chronic constipation.  Currently, she is having a bowel movement only about every 5 to 6 days.  She reports having large, hard and painful stools.  She is seeing blood with almost every bowel movement.  Her bowel movements are very painful, and she is gotten to where she dreads having them.  She has tried multiple different laxatives to improve her bowel frequency, to include an over-the-counter herbal supplement (Colon Clenz), MiraLAX daily, chocolate laxatives and Linzess.   She denies any problems with diarrhea.  Abdominal pain is not a significant problem. She had a colonoscopy in 2019 to evaluate hematochezia.  The colonoscopy was normal but the bowel prep was fair quality and she was recommended to repeat at age 50.  Past Medical History:  Diagnosis Date   Anemia    Anxiety    Bipolar affective disorder (HCC)    Blood transfusion without reported diagnosis    Bronchospasm with bronchitis, acute    Depression    Diabetes (HCC)    diet controlled   Kidney stone    Obesity    Right carpal tunnel syndrome 12/09/2018   SBO (small bowel obstruction) (HCC) 01/2014   Sleep apnea    uses CPAP     Past Surgical History:  Procedure Laterality Date   CARPAL TUNNEL RELEASE Right 08/05/2019   Procedure: RIGHT CARPAL TUNNEL RELEASE;  Surgeon: Cindee Salt, MD;  Location: [facility];  Service: Orthopedics;  Laterality: Right;  IV REGIONAL FOREARM BLOCK   CESAREAN SECTION     ENDOMETRIAL ABLATION     GASTRIC BYPASS  2001   SBO  2015  Colonoscopy June 2019: Dr. Leone Payor: Redundant colon otherwise normal  Family History  Problem Relation Age of Onset   Diabetes Mother    Cancer Mother 65       Throat    Bipolar disorder Mother    Bipolar disorder Sister    Bipolar disorder Brother  Drug abuse Brother    Bipolar disorder Sister    Colon cancer Neg Hx    Cancer - Prostate Neg Hx    Breast cancer Neg Hx    Social History   Tobacco Use   Smoking status: Former    Packs/day: 0.25    Years: 6.00    Pack years: 1.50    Types: Cigarettes   Smokeless tobacco: Never   Tobacco comments:    1/2 ppd   Vaping Use   Vaping Use: Never used  Substance Use Topics   Alcohol use: Yes    Alcohol/week: 0.0 standard drinks    Comment: Two times a week.    Drug use: No   Current Outpatient Medications  Medication Sig Dispense Refill   amphetamine-dextroamphetamine (ADDERALL XR) 25 MG 24 hr capsule Take 1 capsule by mouth every morning. 30  capsule 0   Armodafinil 250 MG tablet Take 1 tablet (250 mg total) by mouth daily 30 tablet 5   divalproex (DEPAKOTE ER) 500 MG 24 hr tablet Take 4 tablets (2,000 mg total) by mouth at bedtime. 360 tablet 1   FLUoxetine (PROZAC) 20 MG tablet Take 3 tablets (60 mg total) by mouth daily. 270 tablet 1   hydrOXYzine (ATARAX) 25 MG tablet Take 1 tablet (25 mg total) by mouth daily as needed for anxiety. 30 tablet 0   lamoTRIgine (LAMICTAL) 100 MG tablet Take 1 tablet (100 mg total) by mouth daily. 90 tablet 1   Lurasidone HCl 60 MG TABS Take 1 tablet (60 mg total) by mouth daily. 90 tablet 1   No current facility-administered medications for this visit.   Allergies  Allergen Reactions   Nsaids Other (See Comments)    Contraindicated with gastric bypass     Review of Systems: All systems reviewed and negative except where noted in HPI.    No results found.  Physical Exam: BP 110/70    Pulse 93    Ht 5\' 4"  (1.626 m)    Wt 191 lb (86.6 kg)    BMI 32.79 kg/m  Constitutional: Pleasant,well-developed, Caucasian female in no acute distress. HEENT: Normocephalic and atraumatic. Conjunctivae are normal. No scleral icterus. Neck supple.  Cardiovascular: Normal rate, regular rhythm.  Pulmonary/chest: Effort normal and breath sounds normal. No wheezing, rales or rhonchi. Abdominal: Soft, nondistended, nontender. Bowel sounds active throughout. There are no masses palpable. No hepatomegaly. Extremities: no edema Rectal: No external hemorrhoids or skin tags.  No anal fissure visible.  Digital rectal exam revealed absence of any palpable rectal mass.  Sphincter tone was normal.  Patient did experience significant pain and discomfort which she says replicates her pain with bowel movements.  A discrete fissure was not palpated. Neurological: Alert and oriented to person place and time. Skin: Skin is warm and dry. No rashes noted. Psychiatric: Normal mood and affect. Behavior is normal.  CBC     Component Value Date/Time   WBC 5.9 12/06/2020 1649   WBC 5.4 10/01/2020 1528   RBC 4.18 12/06/2020 1649   RBC 4.19 10/01/2020 1528   HGB 12.3 12/06/2020 1649   HCT 37.2 12/06/2020 1649   PLT 236 12/06/2020 1649   MCV 89 12/06/2020 1649   MCH 29.4 12/06/2020 1649   MCH 29.8 10/01/2020 1528   MCHC 33.1 12/06/2020 1649   MCHC 33.1 10/01/2020 1528   RDW 14.7 12/06/2020 1649   LYMPHSABS 1.6 12/06/2020 1649   MONOABS 0.5 10/01/2020 1528   EOSABS 0.1 12/06/2020 1649   BASOSABS 0.0  12/06/2020 1649    CMP     Component Value Date/Time   NA 137 10/01/2020 1638   NA 144 01/27/2019 1648   K 3.3 (L) 10/01/2020 1638   CL 108 10/01/2020 1638   CO2 21 (L) 10/01/2020 1638   GLUCOSE 81 10/01/2020 1638   BUN 13 10/01/2020 1638   BUN 14 01/27/2019 1648   CREATININE 0.61 10/01/2020 1638   CREATININE 0.61 05/14/2019 1531   CALCIUM 9.0 10/01/2020 1638   PROT 6.7 10/01/2020 1638   PROT 6.8 01/27/2019 1648   ALBUMIN 3.6 10/01/2020 1638   ALBUMIN 4.3 01/27/2019 1648   AST 22 10/01/2020 1638   ALT 14 10/01/2020 1638   ALKPHOS 62 10/01/2020 1638   BILITOT 0.5 10/01/2020 1638   BILITOT 0.3 01/27/2019 1648   GFRNONAA >60 10/01/2020 1638   GFRAA 112 01/27/2019 1648     ASSESSMENT AND PLAN: 50 year old female with remote history of Roux-en-Y gastric bypass with 60 pounds of unintentional weight loss in the past year and a half, with chronic symptoms of recurrent vomiting without nausea or abdominal pain, constipation, and painful bowel movements with hematochezia.  The etiology of her vomiting episodes is unclear.  She does not have typical GERD symptoms or any significant nausea or abdominal pain associate with vomiting.  Rumination seems possible, but episodes are not associated with meals.  We will evaluate with EGD to assess anatomy and evidence of any stenosis, ulceration or other abnormality. Regarding her hematochezia and dyschezia, her symptoms seem very consistent with an anal fissure,  and a digital rectal exam reproduce her pain symptoms although an actual fissure was not palpated or visualized.  As fissures can sometimes be difficult to appreciate on exam, I recommended we treat the patient for a presumed anal fissure with intra-anal nitroglycerin 0.0125% twice daily for 6 weeks.  We will treat her constipation with Amitiza 24 mcg twice a day and she can add MiraLAX as needed to keep her stools soft. Her unintentional weight loss has been profound, but over a long period of time (almost 2 years) and has stabilized for the most part over the past year.  The likelihood of her unintentional weight loss being from something serious such as malignancy is extremely low because of this.  I suspect her dysgeusia and change in diet is most likely explanation.  I am not sure what is causing her dysgeusia, but medications side effects are certainly possible.  We will reevaluate after upper endoscopy  Vomiting -EGD  Hematochezia/painful bowel movements -Treat for presumptive anal fissure with intra-anal nitroglycerin for 6 weeks - Treat constipation as below  Constipation - Amitiza 24 mcg PO bid  Unintentional weight loss -Suspect secondary to change in diet secondary to dysgeusia - Low suspicion for malignancy given chronicity of weight loss - No need to repeat colonoscopy given study in 2019  The details, risks (including bleeding, perforation, infection, missed lesions, medication reactions and possible hospitalization or surgery if complications occur), benefits, and alternatives to EGD with possible biopsy and possible dilation were discussed with the patient and she consents to proceed.   Narcissa Melder E. Tomasa Rand, MD [facility]   CC:  Ronnald Nian, MD

## 2021-04-28 ENCOUNTER — Other Ambulatory Visit (HOSPITAL_BASED_OUTPATIENT_CLINIC_OR_DEPARTMENT_OTHER): Payer: Self-pay

## 2021-04-28 ENCOUNTER — Encounter: Payer: Self-pay | Admitting: Gastroenterology

## 2021-04-29 ENCOUNTER — Other Ambulatory Visit (HOSPITAL_BASED_OUTPATIENT_CLINIC_OR_DEPARTMENT_OTHER): Payer: Self-pay

## 2021-04-29 ENCOUNTER — Encounter: Payer: Self-pay | Admitting: Psychiatry

## 2021-04-29 ENCOUNTER — Other Ambulatory Visit: Payer: Self-pay

## 2021-04-29 ENCOUNTER — Telehealth (INDEPENDENT_AMBULATORY_CARE_PROVIDER_SITE_OTHER): Payer: 59 | Admitting: Psychiatry

## 2021-04-29 DIAGNOSIS — F411 Generalized anxiety disorder: Secondary | ICD-10-CM | POA: Diagnosis not present

## 2021-04-29 DIAGNOSIS — F3131 Bipolar disorder, current episode depressed, mild: Secondary | ICD-10-CM | POA: Diagnosis not present

## 2021-04-29 MED ORDER — LAMOTRIGINE 150 MG PO TABS
150.0000 mg | ORAL_TABLET | Freq: Every day | ORAL | 0 refills | Status: DC
  Filled 2021-04-29: qty 30, 30d supply, fill #0
  Filled 2021-06-01: qty 30, 30d supply, fill #1

## 2021-04-29 NOTE — Patient Instructions (Addendum)
Continue Depakote 2000 mg at night  Obtain labs (CBC, CMP, VPA in Jan) Continue fluoxetine 60 mg daily  Increase lamotrigine 150 mg daily  Continue latuda 60 mg daily  Next appointment: 1/20 at 10:30, video

## 2021-05-02 ENCOUNTER — Other Ambulatory Visit (HOSPITAL_BASED_OUTPATIENT_CLINIC_OR_DEPARTMENT_OTHER): Payer: Self-pay

## 2021-05-05 ENCOUNTER — Other Ambulatory Visit: Payer: Self-pay | Admitting: Gastroenterology

## 2021-05-05 ENCOUNTER — Other Ambulatory Visit (HOSPITAL_BASED_OUTPATIENT_CLINIC_OR_DEPARTMENT_OTHER): Payer: Self-pay

## 2021-05-06 ENCOUNTER — Other Ambulatory Visit (HOSPITAL_BASED_OUTPATIENT_CLINIC_OR_DEPARTMENT_OTHER): Payer: Self-pay

## 2021-05-10 ENCOUNTER — Other Ambulatory Visit (HOSPITAL_BASED_OUTPATIENT_CLINIC_OR_DEPARTMENT_OTHER): Payer: Self-pay

## 2021-05-10 MED ORDER — AMPHETAMINE-DEXTROAMPHET ER 25 MG PO CP24
ORAL_CAPSULE | Freq: Every morning | ORAL | 0 refills | Status: DC
  Filled 2021-05-10 – 2021-06-01 (×2): qty 30, 30d supply, fill #0

## 2021-05-11 ENCOUNTER — Other Ambulatory Visit (HOSPITAL_BASED_OUTPATIENT_CLINIC_OR_DEPARTMENT_OTHER): Payer: Self-pay

## 2021-05-20 ENCOUNTER — Other Ambulatory Visit (HOSPITAL_BASED_OUTPATIENT_CLINIC_OR_DEPARTMENT_OTHER): Payer: Self-pay

## 2021-05-24 ENCOUNTER — Other Ambulatory Visit (HOSPITAL_BASED_OUTPATIENT_CLINIC_OR_DEPARTMENT_OTHER): Payer: Self-pay

## 2021-05-24 ENCOUNTER — Encounter: Payer: Self-pay | Admitting: Gastroenterology

## 2021-05-30 ENCOUNTER — Ambulatory Visit (AMBULATORY_SURGERY_CENTER): Payer: 59 | Admitting: Gastroenterology

## 2021-05-30 ENCOUNTER — Encounter: Payer: Self-pay | Admitting: Gastroenterology

## 2021-05-30 ENCOUNTER — Other Ambulatory Visit (HOSPITAL_BASED_OUTPATIENT_CLINIC_OR_DEPARTMENT_OTHER): Payer: Self-pay

## 2021-05-30 VITALS — BP 127/85 | HR 51 | Temp 95.9°F | Resp 16 | Ht 64.0 in | Wt 191.0 lb

## 2021-05-30 DIAGNOSIS — R110 Nausea: Secondary | ICD-10-CM

## 2021-05-30 DIAGNOSIS — K2289 Other specified disease of esophagus: Secondary | ICD-10-CM

## 2021-05-30 DIAGNOSIS — K31A0 Gastric intestinal metaplasia, unspecified: Secondary | ICD-10-CM | POA: Diagnosis not present

## 2021-05-30 DIAGNOSIS — K2950 Unspecified chronic gastritis without bleeding: Secondary | ICD-10-CM | POA: Diagnosis not present

## 2021-05-30 DIAGNOSIS — K2100 Gastro-esophageal reflux disease with esophagitis, without bleeding: Secondary | ICD-10-CM

## 2021-05-30 DIAGNOSIS — K229 Disease of esophagus, unspecified: Secondary | ICD-10-CM

## 2021-05-30 DIAGNOSIS — K2970 Gastritis, unspecified, without bleeding: Secondary | ICD-10-CM

## 2021-05-30 MED ORDER — OMEPRAZOLE 20 MG PO CPDR
20.0000 mg | DELAYED_RELEASE_CAPSULE | Freq: Two times a day (BID) | ORAL | 3 refills | Status: DC
  Filled 2021-05-30: qty 60, 30d supply, fill #0
  Filled 2021-08-04: qty 60, 30d supply, fill #1
  Filled 2021-10-31 – 2021-11-11 (×2): qty 60, 30d supply, fill #2
  Filled 2022-02-10: qty 60, 30d supply, fill #3
  Filled 2022-05-02: qty 60, 30d supply, fill #4

## 2021-05-30 NOTE — Progress Notes (Signed)
Called to room to assist during endoscopic procedure.  Patient ID and intended procedure confirmed with present staff. Received instructions for my participation in the procedure from the performing physician.  

## 2021-05-30 NOTE — Progress Notes (Signed)
To pacu, VSS. Report to rn.tb °

## 2021-05-30 NOTE — Progress Notes (Signed)
[facility] History and Physical   Primary Care Physician:  Davy Pique   Reason for Procedure:   Vomiting  Plan:    EGD with possible dilation     HPI: Ashley Franklin is a 51 y.o. female undergoing to evaluate chronic recurrent vomiting.  Vomiting is sometimes post-prandial but not always.  She is s/p gastric bypass surgery in 2001.  She does not have typical GERD symptoms.  She has been losing weight unintentionally recently.  Symptoms have not changed significantly since her clinic visit last month.    Past Medical History:  Diagnosis Date   Anemia    Anxiety    Bipolar affective disorder ([place])    Blood transfusion without reported diagnosis    Bronchospasm with bronchitis, acute    Depression    Diabetes ([place])    diet controlled   Kidney stone    Obesity    Right carpal tunnel syndrome 12/09/2018   SBO (small bowel obstruction) ([place]) 01/2014   Sleep apnea    uses CPAP    Past Surgical History:  Procedure Laterality Date   CARPAL TUNNEL RELEASE Right 08/05/2019   Procedure: RIGHT CARPAL TUNNEL RELEASE;  Surgeon: Daryll Brod, MD;  Location: [place];  Service: Orthopedics;  Laterality: Right;  IV REGIONAL FOREARM BLOCK   CESAREAN SECTION  1994   ENDOMETRIAL ABLATION     GASTRIC BYPASS  2001   KIDNEY STONE SURGERY  2010   SBO  2015    Prior to Admission medications   Medication Sig Start Date End Date Taking? Authorizing Provider  amphetamine-dextroamphetamine (ADDERALL XR) 25 MG 24 hr capsule Take 1 capsule by mouth every morning. 05/10/21  Yes   Armodafinil 250 MG tablet Take 1 tablet (250 mg total) by mouth daily 11/26/20  Yes   divalproex (DEPAKOTE ER) 500 MG 24 hr tablet Take 4 tablets (2,000 mg total) by mouth at bedtime. 03/29/21 09/25/21 Yes Hisada, Elie Goody, MD  FLUoxetine (PROZAC) 20 MG tablet Take 3 tablets (60 mg total) by mouth daily. 03/31/21 09/27/21 Yes Norman Clay, MD  lamoTRIgine (LAMICTAL) 150 MG tablet Take  1 tablet (150 mg total) by mouth daily. 04/29/21 07/28/21 Yes Hisada, Elie Goody, MD  lubiprostone (AMITIZA) 24 MCG capsule Take 1 capsule (24 mcg total) by mouth 2 (two) times daily. 04/27/21  Yes Daryel November, MD  Lurasidone HCl 60 MG TABS Take 1 tablet (60 mg total) by mouth daily. 03/29/21 09/25/21 Yes Hisada, Elie Goody, MD  AMBULATORY NON FORMULARY MEDICATION nitroglycerin 0.125% gel . apply a pea size amount to your rectum three times daily x 6-8 weeks. Patient not taking: Reported on 05/30/2021 04/27/21   Daryel November, MD  hydrOXYzine (ATARAX) 25 MG tablet Take 1 tablet (25 mg total) by mouth daily as needed for anxiety. Patient not taking: Reported on 05/30/2021 04/25/21   Norman Clay, MD    Current Outpatient Medications  Medication Sig Dispense Refill   amphetamine-dextroamphetamine (ADDERALL XR) 25 MG 24 hr capsule Take 1 capsule by mouth every morning. 30 capsule 0   Armodafinil 250 MG tablet Take 1 tablet (250 mg total) by mouth daily 30 tablet 5   divalproex (DEPAKOTE ER) 500 MG 24 hr tablet Take 4 tablets (2,000 mg total) by mouth at bedtime. 360 tablet 1   FLUoxetine (PROZAC) 20 MG tablet Take 3 tablets (60 mg total) by mouth daily. 270 tablet 1   lamoTRIgine (LAMICTAL) 150 MG tablet Take 1 tablet (150 mg total) by mouth  daily. 90 tablet 0   lubiprostone (AMITIZA) 24 MCG capsule Take 1 capsule (24 mcg total) by mouth 2 (two) times daily. 120 capsule 3   Lurasidone HCl 60 MG TABS Take 1 tablet (60 mg total) by mouth daily. 90 tablet 1   AMBULATORY NON FORMULARY MEDICATION nitroglycerin 0.125% gel . apply a pea size amount to your rectum three times daily x 6-8 weeks. (Patient not taking: Reported on 05/30/2021) 30 g 0   hydrOXYzine (ATARAX) 25 MG tablet Take 1 tablet (25 mg total) by mouth daily as needed for anxiety. (Patient not taking: Reported on 05/30/2021) 30 tablet 0   No current facility-administered medications for this visit.    Allergies as of 05/30/2021 - Review  Complete 05/30/2021  Allergen Reaction Noted   Nsaids Other (See Comments) 12/31/2014    Family History  Problem Relation Age of Onset   Diabetes Mother    Cancer Mother 68       Throat    Bipolar disorder Mother    Kidney disease Mother    Heart disease Father    Bipolar disorder Sister    Bipolar disorder Sister    Bipolar disorder Brother    Drug abuse Brother    Colon cancer Maternal Aunt    Kidney disease Maternal Grandmother    Heart disease Paternal Grandfather    Cancer - Prostate Neg Hx    Breast cancer Neg Hx    Esophageal cancer Neg Hx    Rectal cancer Neg Hx    Stomach cancer Neg Hx     Social History   Socioeconomic History   Marital status: Married    Spouse name: Not on file   Number of children: 2   Years of education: Not on file   Highest education level: Not on file  Occupational History   Occupation: office administor  Tobacco Use   Smoking status: Former    Packs/day: 0.25    Years: 6.00    Pack years: 1.50    Types: Cigarettes   Smokeless tobacco: Never   Tobacco comments:    1/2 ppd   Vaping Use   Vaping Use: Never used  Substance and Sexual Activity   Alcohol use: Yes    Alcohol/week: 0.0 standard drinks    Comment: Two times a week.    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  Physical Exam: Vital signs BP 104/69    Pulse 63    Temp (!) 95.9 F (35.5 C)    Ht 5\' 4"  (1.626 m)    Wt  191 lb (86.6 kg)    SpO2 100%    BMI 32.79 kg/m   General:   Alert,  Well-developed, well-nourished, pleasant and cooperative in NAD Airway:  Mallampati 2 Lungs:  Clear throughout to auscultation.   Heart:  Regular rate and rhythm; no murmurs, clicks, rubs,  or gallops. Abdomen:  Soft, nontender and nondistended. Normal bowel sounds.   Neuro/Psych:  Normal mood and affect. A and O x 3   Io Dieujuste E. Candis Schatz, MD [facility]

## 2021-05-30 NOTE — Patient Instructions (Addendum)
Handouts were given to your care partner on Esophagitis and GERD. Prescription for PRILOSEC (OMEPRAZOLE) 20 mg take 2 x daily until we repeat EGD in 8 weeks was sent to your pharmacy. The schedule in not open for 8 weeks out today.  The office will call you to schedule repeat EGD to check healing. The office will also schedule an appointment to be seen in the office in 2-4 weeks for on going management of Chronic GI symptoms. You may resume your current medications today. Await biopsy results.  May take 1-3 weeks to receive pathology results. Please call if any questions or concerns.       YOU HAD AN ENDOSCOPIC PROCEDURE TODAY AT THE [facility]:   Refer to the procedure report that was given to you for any specific questions about what was found during the examination.  If the procedure report does not answer your questions, please call your gastroenterologist to clarify.  If you requested that your care partner not be given the details of your procedure findings, then the procedure report has been included in a sealed envelope for you to review at your convenience later.  YOU SHOULD EXPECT: Some feelings of bloating in the abdomen. Passage of more gas than usual.  Walking can help get rid of the air that was put into your GI tract during the procedure and reduce the bloating. If you had a lower endoscopy (such as a colonoscopy or flexible sigmoidoscopy) you may notice spotting of blood in your stool or on the toilet paper. If you underwent a bowel prep for your procedure, you may not have a normal bowel movement for a few days.  Please Note:  You might notice some irritation and congestion in your nose or some drainage.  This is from the oxygen used during your procedure.  There is no need for concern and it should clear up in a day or so.  SYMPTOMS TO REPORT IMMEDIATELY:  Following upper endoscopy (EGD)  Vomiting of blood or coffee ground material  New chest pain or pain under  the shoulder blades  Painful or persistently difficult swallowing  New shortness of breath  Fever of 100F or higher  Black, tarry-looking stools  For urgent or emergent issues, a gastroenterologist can be reached at any hour by calling [phone]. Do not use MyChart messaging for urgent concerns.    DIET:  We do recommend a small meal at first, but then you may proceed to your regular diet.  Drink plenty of fluids but you should avoid alcoholic beverages for 24 hours.  ACTIVITY:  You should plan to take it easy for the rest of today and you should NOT DRIVE or use heavy machinery until tomorrow (because of the sedation medicines used during the test).    FOLLOW UP: Our staff will call the number listed on your records 48-72 hours following your procedure to check on you and address any questions or concerns that you may have regarding the information given to you following your procedure. If we do not reach you, we will leave a message.  We will attempt to reach you two times.  During this call, we will ask if you have developed any symptoms of COVID 19. If you develop any symptoms (ie: fever, flu-like symptoms, shortness of breath, cough etc.) before then, please call [phone].  If you test positive for Covid 19 in the 2 weeks post procedure, please call and report this information to [place].  If any biopsies were taken you will be contacted by phone or by letter within the next 1-3 weeks.  Please call us at [phone] if you have not heard about the biopsies in 3 weeks.    SIGNATURES/CONFIDENTIALITY: You and/or your care partner have signed paperwork which will be entered into your electronic medical record.  These signatures attest to the fact that that the information above on your After Visit Summary has been reviewed and is understood.  Full responsibility of the confidentiality of this discharge information lies with you and/or your care-partner.

## 2021-05-30 NOTE — Op Note (Signed)
[facility] Patient Name: Ashley DanielsJennifer Franklin Procedure Date: 05/30/2021 10:34 AM MRN: [id number] Endoscopist: Lorin PicketScott E. Ashley Franklin , MD Age: [age] Referring MD:  Date of Birth: [birth year] Gender: Female Account #: [id number] Procedure:                Upper GI endoscopy Indications:              Vomiting Medicines:                Monitored Anesthesia Care Procedure:                Pre-Anesthesia Assessment:                           - Prior to the procedure, a History and Physical                            was performed, and patient medications and                            allergies were reviewed. The patient's tolerance of                            previous anesthesia was also reviewed. The risks                            and benefits of the procedure and the sedation                            options and risks were discussed with the patient.                            All questions were answered, and informed consent                            was obtained. Prior Anticoagulants: The patient has                            taken no previous anticoagulant or antiplatelet                            agents. ASA Grade Assessment: II - A patient with                            mild systemic disease. After reviewing the risks                            and benefits, the patient was deemed in                            satisfactory condition to undergo the procedure.                           After obtaining informed consent, the endoscope was  passed under direct vision. Throughout the                            procedure, the patient's blood pressure, pulse, and                            oxygen saturations were monitored continuously. The                            Olympus Endoscope [id number] was introduced through                            the mouth, and advanced to the efferent jejunal                            loop. The upper GI endoscopy was  accomplished                            without difficulty. The patient tolerated the                            procedure well. Scope In: Scope Out: Findings:                 The examined portions of the nasopharynx,                            oropharynx and larynx were normal.                           LA Grade B (one or more mucosal breaks greater than                            5 mm, not extending between the tops of two mucosal                            folds) esophagitis with no bleeding was found in                            the lower third of the esophagus. Biopsies were                            taken with a cold forceps for histology. Estimated                            blood loss was minimal.                           A focal mucosal change characterized by polypoid,                            gastric-appearing mucosa was found in the proximal  esophagus at 17 cm from the incisors. Biopsies were                            taken with a cold forceps for histology. Estimated                            blood loss was minimal.                           Evidence of a Roux-en-Y gastrojejunostomy was                            found. The gastrojejunal anastomosis was                            characterized by healthy appearing mucosa. This was                            traversed.                           Normal mucosa was found in the entire examined                            stomach. Biopsies were taken with a cold forceps                            for Helicobacter pylori testing. Estimated blood                            loss was minimal.                           Localized mildly erythematous mucosa without active                            bleeding and with no stigmata of bleeding was found                            in the jejunum, just distal to the anastomosis. No                            ulcer was seen. Biopsies were taken with a cold                             forceps for histology. Estimated blood loss was                            minimal.                           Exam of the jejunum was otherwise normal. Complications:            No immediate complications. Estimated Blood Loss:     Estimated blood loss was minimal. Impression:               -  The examined portions of the nasopharynx,                            oropharynx and larynx were normal.                           - LA Grade B reflux esophagitis with no bleeding.                            Biopsied.                           - Polypoid mucosa in the esophagus. Biopsied.                            Suspect gastric inlet patch, but atypical given                            polypoid morphology.                           - Roux-en-Y gastrojejunostomy with gastrojejunal                            anastomosis characterized by healthy appearing                            mucosa.                           - Normal mucosa was found in the entire stomach.                            Biopsied.                           - Erythematous (hyperemic) jejunal mucosa.                            Biopsied. Suspect this is acid-related. Recommendation:           - Patient has a contact number available for                            emergencies. The signs and symptoms of potential                            delayed complications were discussed with the                            patient. Return to normal activities tomorrow.                            Written discharge instructions were provided to the                            patient.                           -  Resume previous diet.                           - Use Prilosec (omeprazole) 20 mg PO BID until                            repeat EGD.                           - Repeat upper endoscopy in 8 weeks to check                            healing.                           - Follow up in clinic in 2-4 weeks for ongoing                             management of chronic GI symptoms.                           - Further recommendations will be based on biopsy                            results. Ashley Franklin E. Ashley Randunningham, MD 05/30/2021 11:10:22 AM This report has been signed electronically.

## 2021-05-30 NOTE — Progress Notes (Signed)
Pt said her dentures were broken (bottom p late was broken in half) before she came in to [facility].  Pt did not want her husband to come back to recovery until she had her dentures in her mouth.  Rush Barer, RN assisted pt with her dentures.  Pt husband was called to come to the recovery room.    Per Dr. Karl Luke, repeat EGD to check for healing in 8 weeks.  The schedule is not out 8 weeks per Jess Barters, RN.  Pt was told the office will contact her to schedule when the schedule is opened.    Pt request a work note for today.   No problems noted in the recovery room. maw

## 2021-06-01 ENCOUNTER — Telehealth: Payer: Self-pay

## 2021-06-01 ENCOUNTER — Encounter: Payer: Self-pay | Admitting: Psychiatry

## 2021-06-01 ENCOUNTER — Other Ambulatory Visit: Payer: Self-pay | Admitting: Psychiatry

## 2021-06-01 ENCOUNTER — Other Ambulatory Visit (HOSPITAL_BASED_OUTPATIENT_CLINIC_OR_DEPARTMENT_OTHER): Payer: Self-pay

## 2021-06-01 ENCOUNTER — Telehealth: Payer: Self-pay | Admitting: *Deleted

## 2021-06-01 MED ORDER — ARMODAFINIL 250 MG PO TABS
250.0000 mg | ORAL_TABLET | Freq: Every day | ORAL | 5 refills | Status: DC
  Filled 2021-06-01: qty 30, 30d supply, fill #0
  Filled 2021-08-04: qty 30, 30d supply, fill #1
  Filled 2021-10-11: qty 30, 30d supply, fill #2

## 2021-06-01 MED ORDER — HYDROXYZINE HCL 25 MG PO TABS
25.0000 mg | ORAL_TABLET | Freq: Every day | ORAL | 2 refills | Status: DC | PRN
  Filled 2021-06-01: qty 30, 30d supply, fill #0
  Filled 2021-07-20: qty 30, 30d supply, fill #1
  Filled 2021-08-29: qty 30, 30d supply, fill #2

## 2021-06-01 NOTE — Telephone Encounter (Signed)
°  Follow up Call-  Call back number 05/30/2021  Post procedure Call Back phone  # [phone]  Permission to leave phone message Yes  Some recent data might be hidden     Patient questions:  Do you have a fever, pain , or abdominal swelling? No. Pain Score  0 *  Have you tolerated food without any problems? Yes.    Have you been able to return to your normal activities? Yes.    Do you have any questions about your discharge instructions: Diet   No. Medications  No. Follow up visit  No.  Do you have questions or concerns about your Care? No.  Actions: * If pain score is 4 or above: No action needed, pain <4.

## 2021-06-01 NOTE — Telephone Encounter (Signed)
First follow up call attempt.  LVM. 

## 2021-06-02 ENCOUNTER — Other Ambulatory Visit (HOSPITAL_BASED_OUTPATIENT_CLINIC_OR_DEPARTMENT_OTHER): Payer: Self-pay

## 2021-06-02 NOTE — Progress Notes (Signed)
Virtual Visit via Video Note  I connected with Ashley Franklin on 06/03/21 at 10:30 AM EST by a video enabled telemedicine application and verified that I am speaking with the correct person using two identifiers.  Location: Patient: work Provider: office Persons participated in the visit- patient, provider    I discussed the limitations of evaluation and management by telemedicine and the availability of in person appointments. The patient expressed understanding and agreed to proceed.    I discussed the assessment and treatment plan with the patient. The patient was provided an opportunity to ask questions and all were answered. The patient agreed with the plan and demonstrated an understanding of the instructions.   The patient was advised to call back or seek an in-person evaluation if the symptoms worsen or if the condition fails to improve as anticipated.  I provided 20 minutes of non-face-to-face time during this encounter.   Norman Clay, MD    [facility] MD/PA/NP OP Progress Note  06/03/2021 10:59 AM Ashley Franklin  MRN:  [id number]  Chief Complaint:  Chief Complaint   Follow-up; Other    HPI:  This is a follow-up appointment for bipolar disorder and anxiety.  She states that she still lives with her husband, and we are moving a week.  She was told by her husband that she is most manic in the past 20 years, out of head, crazy, and that is why he is doing this.  She does not think that way about herself.  She feels confident and more secure.  She is now going to school full-time and works full-time.  She lives her school, and feels excited that there are 2 semesters left for bachelor degree.  She enjoys book club.  She volunteers at times.  She feels more motivated lately.  Although her husband did not like the idea of her going to school, she has been planning for this for finances prior to starting a school.  She agrees that there has been negativity from him.  She was always  told that she would not be able to do things independently, and was told that she acts like a child.  She does not think herself this way, and feels remarkably relieved, being away from her husband.  Although she tends to feel more anxious when she is with her husband, she denies any panic attacks.  She denies feeling depressed.  She occasionally has anhedonia. She sleeps well, and feels less fatigue during the day.  She has good concentration.  She denies change in weight or appetite.  She denies SI.  She denies decreased need for sleep or euphonia.  She drinks 2 mixed drink on weekend at times.  She denies drug use.  She feels comfortable to stay on the medication as it is at this time.   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 her daughter obtained PHD in pharmacy, her son bought a house   Visit Diagnosis:    ICD-10-CM   1. Bipolar disorder, in partial remission, most recent episode depressed ([place])  F31.75     2. Anxiety state  F41.1       Past Psychiatric History: Please see initial evaluation for full details. I have reviewed the history. No updates at this time.     Past Medical History:  Past Medical History:  Diagnosis Date   Anemia  Anxiety    Bipolar affective disorder ([place])    Blood transfusion without reported diagnosis    Bronchospasm with bronchitis, acute    Depression    Diabetes ([place])    diet controlled   Kidney stone    Obesity    Right carpal tunnel syndrome 12/09/2018   SBO (small bowel obstruction) ([place]) 01/2014   Sleep apnea    uses CPAP    Past Surgical History:  Procedure Laterality Date   CARPAL TUNNEL RELEASE Right 08/05/2019   Procedure: RIGHT CARPAL TUNNEL RELEASE;  Surgeon: Daryll Brod, MD;  Location: [facility];  Service: Orthopedics;  Laterality: Right;  IV REGIONAL FOREARM BLOCK    CESAREAN SECTION  1994   ENDOMETRIAL ABLATION     GASTRIC BYPASS  2001   KIDNEY STONE SURGERY  2010   SBO  2015    Family Psychiatric History: Please see initial evaluation for full details. I have reviewed the history. No updates at this time.     Family History:  Family History  Problem Relation Age of Onset   Diabetes Mother    Cancer Mother 81       Throat    Bipolar disorder Mother    Kidney disease Mother    Heart disease Father    Bipolar disorder Sister    Bipolar disorder Sister    Bipolar disorder Brother    Drug abuse Brother    Colon cancer Maternal Aunt    Kidney disease Maternal Grandmother    Heart disease Paternal Grandfather    Cancer - Prostate Neg Hx    Breast cancer Neg Hx    Esophageal cancer Neg Hx    Rectal cancer Neg Hx    Stomach cancer Neg Hx     Social History:  Social History   Socioeconomic History   Marital status: Married    Spouse name: Not on file   Number of children: 2   Years of education: Not on file   Highest education level: Not on file  Occupational History   Occupation: office administor  Tobacco Use   Smoking status: Former    Packs/day: 0.25    Years: 6.00    Pack years: 1.50    Types: Cigarettes   Smokeless tobacco: Never   Tobacco comments:    1/2 ppd   Vaping Use   Vaping Use: Never used  Substance and Sexual Activity   Alcohol use: Yes    Alcohol/week: 0.0 standard drinks    Comment: Two times a week.    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, music teacher   Right handed    Caffeine 2 cups daily    Social Determinants of Health   Financial Resource  Strain: Not on file  Food Insecurity: Not on file  Transportation Needs: Not on file  Physical Activity: Not on file  Stress: Not on file  Social Connections: Not on file    Allergies:  Allergies  Allergen Reactions   Nsaids Other (See Comments)    Contraindicated with gastric  bypass    Metabolic Disorder Labs: Lab Results  Component Value Date   HGBA1C 4.9 02/06/2017   MPG 105 02/26/2015   No results found for: PROLACTIN Lab Results  Component Value Date   CHOL 165 02/06/2017   TRIG 87 02/06/2017   HDL 54 02/06/2017   LDLCALC 94 02/06/2017   Lab Results  Component Value Date   TSH 1.12 07/10/2017   TSH 1.22 06/08/2016    Therapeutic Level Labs: No results found for: LITHIUM Lab Results  Component Value Date   VALPROATE 62 11/29/2020   VALPROATE 67 12/18/2019   No components found for:  CBMZ  Current Medications: Current Outpatient Medications  Medication Sig Dispense Refill   [START ON 06/25/2021] FLUoxetine (PROZAC) 20 MG capsule Take 3 capsules (60 mg total) by mouth daily. 270 capsule 0   AMBULATORY NON FORMULARY MEDICATION nitroglycerin 0.125% gel . apply a pea size amount to your rectum three times daily x 6-8 weeks. (Patient not taking: Reported on 05/30/2021) 30 g 0   amphetamine-dextroamphetamine (ADDERALL XR) 25 MG 24 hr capsule Take 1 capsule by mouth every morning. 30 capsule 0   Armodafinil 250 MG tablet Take 1 tablet (250 mg total) by mouth daily 30 tablet 5   divalproex (DEPAKOTE ER) 500 MG 24 hr tablet Take 4 tablets (2,000 mg total) by mouth at bedtime. 360 tablet 1   FLUoxetine (PROZAC) 20 MG tablet Take 3 tablets (60 mg total) by mouth daily. 270 tablet 1   hydrOXYzine (ATARAX) 25 MG tablet Take 1 tablet (25 mg total) by mouth daily as needed for anxiety. 30 tablet 2   lamoTRIgine (LAMICTAL) 150 MG tablet Take 1 tablet (150 mg total) by mouth daily. 90 tablet 0   lubiprostone (AMITIZA) 24 MCG capsule Take 1 capsule (24 mcg total) by mouth 2  (two) times daily. 120 capsule 3   Lurasidone HCl 60 MG TABS Take 1 tablet (60 mg total) by mouth daily. 90 tablet 1   omeprazole (PRILOSEC) 20 MG capsule Take 1 capsule (20 mg total) by mouth 2 (two) times daily before a meal. Best to take on an empty stomach. 120 capsule 3   No current facility-administered medications for this visit.     Musculoskeletal: Strength & Muscle Tone:  N/A Gait & Station:  N/A Patient leans: N/A  Psychiatric Specialty Exam: Review of Systems  Psychiatric/Behavioral:  Negative for agitation, behavioral problems, confusion, decreased concentration, dysphoric mood, hallucinations, self-injury, sleep disturbance and suicidal ideas. The patient is nervous/anxious. The patient is not hyperactive.   All other systems reviewed and are negative.  There were no vitals taken for this visit.There is no height or weight on file to calculate BMI.  General Appearance: Fairly Groomed  Eye Contact:  Good  Speech:  Clear and Coherent  Volume:  Normal  Mood:   good  Affect:  Appropriate, Congruent, and smiles  Thought Process:  Coherent  Orientation:  Full (Time, Place, and Person)  Thought Content: Logical   Suicidal Thoughts:  No  Homicidal Thoughts:  No  Memory:  Immediate;   Good  Judgement:  Good  Insight:  Good  Psychomotor Activity:  Normal  Concentration:  Concentration: Good and Attention Span: Good  Recall:  Good  Fund of Knowledge: Good  Language: Good  Akathisia:  No  Handed:  Right  AIMS (if indicated): not done  Assets:  Communication Skills Desire for Improvement  ADL's:  Intact  Cognition: WNL  Sleep:  Good   Screenings:  PHQ2-9    Flowsheet Row Video Visit from 06/03/2021 in [facility] Video Visit from 02/25/2021 in [facility] Office Visit from 11/29/2020 in [facility] Video Visit from 08/31/2020 in [facility] Office Visit  from 07/31/2019 in [facility]  PHQ-2 Total Score 0 0 1 0 0      Flowsheet Row Video Visit from 02/25/2021 in [facility] Office Visit from 11/29/2020 in [facility] ED from 11/24/2020 in [place] No Risk Error: Question 6 not populated Error: Question 6 not populated        Assessment and Plan:  Ashley Franklin is a 51 y.o. year old female with a history of bipolar disorder, anxiety, anemia, obesity s/p gastric bypass surgery, who presents for follow up appointment for below.    1. Bipolar disorder, in partial remission, most recent episode depressed ([place]) 2. Anxiety state There has been significant improvement in depressive symptoms and  anxiety since up titration of lamotrigine.  Psychosocial stressors includes marital conflict, estranged relationship with her daughter, and conflict with her son.  She is well engaged at work and enjoys her school work.  Will continue Depakote, Latuda , lamotrigine to target bipolar disorder.  Will continue fluoxetine to target depression and anxiety.    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 Currently on adderall and armodafinil.  She has a follow up appointment.    This clinician has discussed the side effect associated with medication prescribed during this encounter. Please refer to notes in the previous encounters for more details.     Plan:   Continue Depakote 2000 mg at night  Obtain labs (CBC, CMP, VPA) Continue fluoxetine 60 mg daily  Continue lamotrigine 150 mg daily  Continue latuda 60 mg daily - monitor rigidity on left arm Next appointment: 2/28 at 11:30  for 30 mins, video  [email or web address] (CMP, Plt normal 09/2020, VPA>60 in July 2022) - on Adderall XR 25 mg daily, armodafinil for narcolepsy   Past trials of medication: Lexapro, Paxil, amitriptyline, Abilify (irritable), oxcarbazepine,  perphenazine, Trifluoperazine, Klonopin, Valium. Vistaril, Ambien     The patient demonstrates the following  risk factors for suicide: Chronic risk factors for suicide include psychiatric disorder /bipolar disorder, previous self-harm of scratching herself. Acute risk factors for suicide include none. Protective factors for this patient include positive social support, positive therapeutic relationship, hope for the future. Considering these factors, the overall suicide risk at this point appears to be low. Patient does have gun access at home and agrees to lock guns. Discussed in detail safety plan that anytime having active suicidal thoughts or homicidal thoughts and she need to call 911 or go to local emergency room.   Norman Clay, MD 06/03/2021, 10:59 AM

## 2021-06-02 NOTE — Telephone Encounter (Signed)
This encounter was created in error - please disregard.

## 2021-06-03 ENCOUNTER — Other Ambulatory Visit: Payer: Self-pay

## 2021-06-03 ENCOUNTER — Telehealth (INDEPENDENT_AMBULATORY_CARE_PROVIDER_SITE_OTHER): Payer: 59 | Admitting: Psychiatry

## 2021-06-03 ENCOUNTER — Other Ambulatory Visit (HOSPITAL_BASED_OUTPATIENT_CLINIC_OR_DEPARTMENT_OTHER): Payer: Self-pay

## 2021-06-03 ENCOUNTER — Encounter: Payer: Self-pay | Admitting: Psychiatry

## 2021-06-03 DIAGNOSIS — F3175 Bipolar disorder, in partial remission, most recent episode depressed: Secondary | ICD-10-CM | POA: Diagnosis not present

## 2021-06-03 DIAGNOSIS — F411 Generalized anxiety disorder: Secondary | ICD-10-CM

## 2021-06-03 MED ORDER — FLUOXETINE HCL 20 MG PO CAPS
60.0000 mg | ORAL_CAPSULE | Freq: Every day | ORAL | 0 refills | Status: DC
Start: 2021-06-25 — End: 2021-12-07
  Filled 2021-06-03: qty 270, 90d supply, fill #0
  Filled 2021-06-03 (×2): qty 90, 30d supply, fill #0
  Filled 2021-08-04: qty 90, 30d supply, fill #1
  Filled 2021-10-11: qty 90, 30d supply, fill #2

## 2021-06-03 NOTE — Patient Instructions (Signed)
Continue Depakote 2000 mg at night  Obtain labs (CBC, CMP, VPA) Continue fluoxetine 60 mg daily  Continue lamotrigine 150 mg daily  Continue latuda 60 mg daily  Next appointment: 2/28 at 11:30

## 2021-06-06 ENCOUNTER — Other Ambulatory Visit (HOSPITAL_BASED_OUTPATIENT_CLINIC_OR_DEPARTMENT_OTHER): Payer: Self-pay

## 2021-06-16 NOTE — Progress Notes (Signed)
[facility],  The biopsies of your small bowel were unremarkable with no evidence of inflammation or ulceration. The biopsies taken from your stomach were notable for mild chronic gastritis (inflammation) which is a common finding, but there was no evidence of Helicobacter pylori infection. This common finding is not likely to explain abdominal pain and there is no specific treatment or further evaluation recommended. The biopsies of your esophagus showed changes consistent with mild acid reflux. The biopsies of the abnormal appearing mucosa at the top of your esophagus were consistent with a gastric inlet patch, which is a benign focus of stomach tissue within the esophagus.  The pathologist did comment on evidence of intestinal metaplasia which is a finding that has been associated with increased risk of cancer.  This finding is not typically found within an area of gastric inlet patch.  I would like to reassess this area in 6 months and take more biopsies to see if the intestinal metaplasia is still present.  I see you have a follow-up clinic appointment with me in 2 weeks.  We can discuss these findings further at that time, as well as discuss further options for managing your chronic GI symptoms.

## 2021-06-29 ENCOUNTER — Ambulatory Visit: Payer: 59 | Admitting: Gastroenterology

## 2021-07-01 ENCOUNTER — Other Ambulatory Visit (HOSPITAL_BASED_OUTPATIENT_CLINIC_OR_DEPARTMENT_OTHER): Payer: Self-pay

## 2021-07-01 MED ORDER — AMPHETAMINE-DEXTROAMPHET ER 25 MG PO CP24
ORAL_CAPSULE | Freq: Every morning | ORAL | 0 refills | Status: DC
  Filled 2021-08-29: qty 30, 30d supply, fill #0

## 2021-07-01 MED ORDER — AMPHETAMINE-DEXTROAMPHET ER 25 MG PO CP24
ORAL_CAPSULE | Freq: Every morning | ORAL | 0 refills | Status: DC
  Filled 2021-07-01 – 2021-10-11 (×2): qty 30, 30d supply, fill #0

## 2021-07-01 MED ORDER — AMPHETAMINE-DEXTROAMPHET ER 25 MG PO CP24
ORAL_CAPSULE | Freq: Every morning | ORAL | 0 refills | Status: DC
  Filled 2021-07-01: qty 30, 30d supply, fill #0

## 2021-07-07 ENCOUNTER — Other Ambulatory Visit (HOSPITAL_BASED_OUTPATIENT_CLINIC_OR_DEPARTMENT_OTHER): Payer: Self-pay

## 2021-07-08 NOTE — Progress Notes (Signed)
Virtual Visit via Video Note  I connected with Ashley Franklin on 07/12/21 at 11:30 AM EST by a video enabled telemedicine application and verified that I am speaking with the correct person using two identifiers.  Location: Patient: home Provider: office Persons participated in the visit- patient, provider    I discussed the limitations of evaluation and management by telemedicine and the availability of in person appointments. The patient expressed understanding and agreed to proceed.     I discussed the assessment and treatment plan with the patient. The patient was provided an opportunity to ask questions and all were answered. The patient agreed with the plan and demonstrated an understanding of the instructions.   The patient was advised to call back or seek an in-person evaluation if the symptoms worsen or if the condition fails to improve as anticipated.  I provided 20 minutes of non-face-to-face time during this encounter.   Neysa Hottereina Marella Vanderpol, MD   [facility] MD/PA/NP OP Progress Note  07/12/2021 12:10 PM Ashley OatsJennifer S Rothenberger  MRN:  [id number]  Chief Complaint:  Chief Complaint  Patient presents with   Follow-up   Other   Anxiety   HPI:  This is a follow-up appointment for bipolar disorder and an anxiety.  She states that she moved out, and is living on her own for the past month.  She has been working on the house on weekends.  She states that she has distant relationship with her husband in separation since she moved out.  She states that it is convoluted when she was asked how she would like to do with the relationship.  She does not want to disappoint family, although she knows that it should not be the major reason to make a decision.  She notices that she has been super negative, which happened 8-10 times over the past month.  She tends to beat herself up in her thoughts, and shut down.  She also has some confidence of being able to take care of herself, although she thought she does  not have capability to do so in the past.  She sleeps 7 to 9 hours, and feels refreshed in the morning.  She feels less fatigue/has less drowsiness.  She denies any change in appetite.  She denies SI.  She feels she has more purpose lately.  She has had worsening in anxiety, although she denies panic attacks.  She rarely drinks alcohol.  She denies drug use.  She wants to stay on the medication as it is at this time.   Visit Diagnosis:    ICD-10-CM   1. Bipolar affective disorder, currently depressed, mild (HCC)  F31.31     2. Anxiety state  F41.1       Past Psychiatric History: Please see initial evaluation for full details. I have reviewed the history. No updates at this time.     Past Medical History:  Past Medical History:  Diagnosis Date   Anemia    Anxiety    Bipolar affective disorder (HCC)    Blood transfusion without reported diagnosis    Bronchospasm with bronchitis, acute    Depression    Diabetes (HCC)    diet controlled   Kidney stone    Obesity    Right carpal tunnel syndrome 12/09/2018   SBO (small bowel obstruction) (HCC) 01/2014   Sleep apnea    uses CPAP    Past Surgical History:  Procedure Laterality Date   CARPAL TUNNEL RELEASE Right 08/05/2019   Procedure: RIGHT  CARPAL TUNNEL RELEASE;  Surgeon: Cindee Salt, MD;  Location: [facility];  Service: Orthopedics;  Laterality: Right;  IV REGIONAL FOREARM BLOCK   CESAREAN SECTION  1994   ENDOMETRIAL ABLATION     GASTRIC BYPASS  2001   KIDNEY STONE SURGERY  2010   SBO  2015    Family Psychiatric History: Please see initial evaluation for full details. I have reviewed the history. No updates at this time.     Family History:  Family History  Problem Relation Age of Onset   Diabetes Mother    Cancer Mother 27       Throat    Bipolar disorder Mother    Kidney disease Mother    Heart disease Father    Bipolar disorder Sister    Bipolar disorder Sister    Bipolar disorder Brother    Drug  abuse Brother    Colon cancer Maternal Aunt    Kidney disease Maternal Grandmother    Heart disease Paternal Grandfather    Cancer - Prostate Neg Hx    Breast cancer Neg Hx    Esophageal cancer Neg Hx    Rectal cancer Neg Hx    Stomach cancer Neg Hx     Social History:  Social History   Socioeconomic History   Marital status: Married    Spouse name: Not on file   Number of children: 2   Years of education: Not on file   Highest education level: Not on file  Occupational History   Occupation: office administor  Tobacco Use   Smoking status: Former    Packs/day: 0.25    Years: 6.00    Pack years: 1.50    Types: Cigarettes   Smokeless tobacco: Never   Tobacco comments:    1/2 ppd   Vaping Use   Vaping Use: Never used  Substance and Sexual Activity   Alcohol use: Yes    Alcohol/week: 0.0 standard drinks    Comment: Two times a week.    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, music teacher   Right handed    Caffeine 2 cups daily    Social Determinants of Health   Financial Resource Strain: Not on file  Food Insecurity: Not on file  Transportation Needs: Not on file  Physical Activity: Not on file  Stress: Not on file  Social Connections: Not on file    Allergies:  Allergies  Allergen Reactions   Nsaids Other (See Comments)    Contraindicated with gastric bypass    Metabolic Disorder Labs: Lab Results  Component Value Date   HGBA1C 4.9 02/06/2017   MPG 105 02/26/2015   No results found for: PROLACTIN Lab Results  Component Value Date   CHOL 165 02/06/2017   TRIG 87 02/06/2017   HDL 54 02/06/2017   LDLCALC  94 02/06/2017   Lab Results  Component Value Date   TSH 1.12 07/10/2017   TSH 1.22 06/08/2016    Therapeutic Level Labs: No results found for: LITHIUM Lab Results  Component Value Date  VALPROATE 62 11/29/2020   VALPROATE 67 12/18/2019   No components found for:  CBMZ  Current Medications: Current Outpatient Medications  Medication Sig Dispense Refill   AMBULATORY NON FORMULARY MEDICATION nitroglycerin 0.125% gel . apply a pea size amount to your rectum three times daily x 6-8 weeks. (Patient not taking: Reported on 05/30/2021) 30 g 0   amphetamine-dextroamphetamine (ADDERALL XR) 25 MG 24 hr capsule Take 1 capsule by mouth every morning. 30 capsule 0   [START ON 07/29/2021] amphetamine-dextroamphetamine (ADDERALL XR) 25 MG 24 hr capsule Take 1 capsule by mouth every morning. 30 capsule 0   amphetamine-dextroamphetamine (ADDERALL XR) 25 MG 24 hr capsule Take 1 capsule by mouth every morning. 30 capsule 0   Armodafinil 250 MG tablet Take 1 tablet (250 mg total) by mouth daily 30 tablet 5   divalproex (DEPAKOTE ER) 500 MG 24 hr tablet Take 4 tablets (2,000 mg total) by mouth at bedtime. 360 tablet 1   FLUoxetine (PROZAC) 20 MG capsule Take 3 capsules (60 mg total) by mouth daily. 270 capsule 0   FLUoxetine (PROZAC) 20 MG tablet Take 3 tablets (60 mg total) by mouth daily. 270 tablet 1   hydrOXYzine (ATARAX) 25 MG tablet Take 1 tablet (25 mg total) by mouth daily as needed for anxiety. 30 tablet 2   [START ON 07/28/2021] lamoTRIgine (LAMICTAL) 150 MG tablet Take 1 tablet (150 mg total) by mouth daily. 90 tablet 0   lubiprostone (AMITIZA) 24 MCG capsule Take 1 capsule (24 mcg total) by mouth 2 (two) times daily. 120 capsule 3   Lurasidone HCl 60 MG TABS Take 1 tablet (60 mg total) by mouth daily. 90 tablet 1   omeprazole (PRILOSEC) 20 MG capsule Take 1 capsule (20 mg total) by mouth 2 (two) times daily before a meal. Best to take on an empty stomach. 120 capsule 3   No current  facility-administered medications for this visit.     Musculoskeletal: Strength & Muscle Tone:  N/A Gait & Station:  N/A Patient leans: N/A  Psychiatric Specialty Exam: Review of Systems  Psychiatric/Behavioral:  Positive for dysphoric mood. Negative for agitation, behavioral problems, confusion, decreased concentration, hallucinations, self-injury, sleep disturbance and suicidal ideas. The patient is nervous/anxious. The patient is not hyperactive.   All other systems reviewed and are negative.  There were no vitals taken for this visit.There is no height or weight on file to calculate BMI.  General Appearance: Fairly Groomed  Eye Contact:  Good  Speech:  Clear and Coherent  Volume:  Normal  Mood:  Depressed  Affect:  Appropriate, Congruent, and calm  Thought Process:  Coherent  Orientation:  Full (Time, Place, and Person)  Thought Content: Logical   Suicidal Thoughts:  No  Homicidal Thoughts:  No  Memory:  Immediate;   Good  Judgement:  Good  Insight:  Good  Psychomotor Activity:  Normal  Concentration:  Concentration: Good and Attention Span: Good  Recall:  Good  Fund of Knowledge: Good  Language: Good  Akathisia:  No  Handed:  Right  AIMS (if indicated): not done  Assets:  Communication Skills Desire for Improvement  ADL's:  Intact  Cognition: WNL  Sleep:  Good   Screenings: PHQ2-9    Flowsheet Row Video Visit from 06/03/2021 in [facility] Video Visit from 02/25/2021 in [facility] Office Visit from 11/29/2020 in [facility] Video Visit from 08/31/2020 in [facility] Office Visit from 07[facility]31/2019 in [facility]  PHQ-2 Total Score 0 0 1 0 0      Flowsheet Row Video Visit from 02/25/2021 in [facility] Office Visit from 11/29/2020 in [facility] ED from 11/24/2020 in [facility]  EMERGENCY DEPARTMENT  C-SSRS RISK CATEGORY No Risk Error: Question 6 not populated Error: Question 6 not populated        Assessment and Plan:  Ashley Franklin is a 51 y.o. year old female with a history of  bipolar disorder, anxiety, anemia, obesity s/p gastric bypass surgery, who presents for follow up appointment for below.   1. Bipolar affective disorder, currently depressed, mild (HCC) 2. Anxiety state There has been slight worsening in depressive symptoms and anxiety since the last visit.  Psychosocial stressors includes starting to live on her own on, marital conflict, estranged relationship with her daughter, and conflict with her son.  Will continue current medication regimen given her preference to stay the same at this time.  Will continue Depakote, Latuda, lamotrigine to target bipolar disorder.  She was reminded again to get labs for monitoring.  Will continue to monitor any side effect especially given she is on both Depakote/lamotrigine.  Will continue fluoxetine to target depression and anxiety.  Will continue hydroxyzine as needed for anxiety.     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 Currently on adderall and armodafinil.  She has a follow up appointment.    This clinician has discussed the side effect associated with medication prescribed during this encounter. Please refer to notes in the previous encounters for more details.     Plan:  Continue Depakote 2000 mg at night  Obtain labs (CBC, CMP, VPA) Continue fluoxetine 60 mg daily  Continue lamotrigine 150 mg daily  Continue latuda 60 mg daily - monitor rigidity on left arm Continue hydroxyzine 25 mg daily as needed for anxiety  Next appointment: 3/28 at 4 PM for 30 mins, video  [email or web address] (CMP, Plt normal 09/2020, VPA>60 in July 2022) - on Adderall XR 25 mg daily, armodafinil for narcolepsy   Past trials of medication: Lexapro, Paxil, amitriptyline, Abilify (irritable),  oxcarbazepine, perphenazine, Trifluoperazine, Klonopin, Valium. Vistaril, Ambien     The patient demonstrates the following  risk factors for suicide: Chronic risk factors for suicide include psychiatric disorder /bipolar disorder, previous self-harm of scratching herself. Acute risk factors for suicide include none. Protective factors for this patient include positive social support, positive therapeutic relationship, hope for the future. Considering these factors, the overall suicide risk at this point appears to be low. Patient does have gun access at home and agrees to lock guns. Discussed in detail safety plan that anytime having active suicidal thoughts or homicidal thoughts and she need to call 911 or go to local emergency room.     Collaboration of Care: Collaboration of Care: Other N/A  Consent: Patient/Guardian gives verbal consent for treatment and assignment of benefits for services provided during this visit. Patient/Guardian expressed understanding and agreed to proceed.    [name], MD 07/12/2021, 12:10 PM

## 2021-07-12 ENCOUNTER — Other Ambulatory Visit: Payer: Self-pay

## 2021-07-12 ENCOUNTER — Encounter: Payer: Self-pay | Admitting: Psychiatry

## 2021-07-12 ENCOUNTER — Telehealth (INDEPENDENT_AMBULATORY_CARE_PROVIDER_SITE_OTHER): Payer: 59 | Admitting: Psychiatry

## 2021-07-12 ENCOUNTER — Other Ambulatory Visit (HOSPITAL_BASED_OUTPATIENT_CLINIC_OR_DEPARTMENT_OTHER): Payer: Self-pay

## 2021-07-12 DIAGNOSIS — F3131 Bipolar disorder, current episode depressed, mild: Secondary | ICD-10-CM

## 2021-07-12 DIAGNOSIS — F411 Generalized anxiety disorder: Secondary | ICD-10-CM

## 2021-07-12 MED ORDER — LAMOTRIGINE 150 MG PO TABS
150.0000 mg | ORAL_TABLET | Freq: Every day | ORAL | 0 refills | Status: DC
  Filled 2021-07-12: qty 30, 30d supply, fill #0

## 2021-07-12 NOTE — Patient Instructions (Addendum)
Continue Depakote 2000 mg at night  Obtain labs (CBC, CMP, VPA) Continue fluoxetine 60 mg daily  Continue lamotrigine 150 mg daily  Continue latuda 60 mg daily  Continue hydroxyzine 25 mg daily as needed for anxiety  Next appointment: 3/28 at 4 PM

## 2021-07-20 ENCOUNTER — Other Ambulatory Visit (HOSPITAL_BASED_OUTPATIENT_CLINIC_OR_DEPARTMENT_OTHER): Payer: Self-pay

## 2021-08-03 ENCOUNTER — Encounter: Payer: Self-pay | Admitting: Gastroenterology

## 2021-08-04 NOTE — Progress Notes (Signed)
Virtual Visit via Video Note ? ?I connected with Ashley Franklin on 08/09/21 at  4:00 PM EDT by a video enabled telemedicine application and verified that I am speaking with the correct person using two identifiers. ? ?Location: ?Patient: home ?Provider: office ?Persons participated in the visit- patient, provider  ?  ?I discussed the limitations of evaluation and management by telemedicine and the availability of in person appointments. The patient expressed understanding and agreed to proceed. ?  ?I discussed the assessment and treatment plan with the patient. The patient was provided an opportunity to ask questions and all were answered. The patient agreed with the plan and demonstrated an understanding of the instructions. ?  ?The patient was advised to call back or seek an in-person evaluation if the symptoms worsen or if the condition fails to improve as anticipated. ? ?I provided 26 minutes of non-face-to-face time during this encounter. ? ? ?[name], MD ? ? ? ?BH MD/PA/NP OP Progress Note ? ?08/09/2021 5:28 PM ?Ashley Franklin  ?MRN:  [id number] ? ?Chief Complaint:  ?Chief Complaint  ?Patient presents with  ? Follow-up  ? Other  ? ?HPI:  ?This is a follow-up appointment for bipolar disorder.  ?She reports much better relationship with her husband . She is unsure whether the relationship will be going with her husband as she has so many other things to think. She states that her mother was found to have cancer in her spine. She and her siblings are also trying to help her father, who is in 66's.  She is trying to find a new job.  She was notified of cut in hours/decreasing in salary at work.  She thinks that they are trying to get her to the situation of her leaving.  She feels terrible about this.  She is concerned about the school as well.  She has some days of having a pity party.  She does not take a shower, and feels fatigue, hypersomnia on those days.  However, she has been able to manage it, and go  to work and do schoolwork.  She denies change in appetite.  She has fair concentration.  She denies SI.  She denies decreased need for sleep or euphonia.  She denies increased goal-directed activity.  She feels comfortable to stay on the current medication regimen at this time.  ? ? ?Visit Diagnosis:  ?  ICD-10-CM   ?1. Bipolar affective disorder, currently depressed, mild (HCC)  F31.31 Valproic acid level  ?  CBC  ?  Comprehensive metabolic panel  ?  ?2. Anxiety state  F41.1   ?  ? ? ?Past Psychiatric History: Please see initial evaluation for full details. I have reviewed the history. No updates at this time.  ?  ? ?Past Medical History:  ?Past Medical History:  ?Diagnosis Date  ? Anemia   ? Anxiety   ? Bipolar affective disorder (HCC)   ? Blood transfusion without reported diagnosis   ? Bronchospasm with bronchitis, acute   ? Depression   ? Diabetes (HCC)   ? diet controlled  ? Kidney stone   ? Obesity   ? Right carpal tunnel syndrome 12/09/2018  ? SBO (small bowel obstruction) (HCC) 01/2014  ? Sleep apnea   ? uses CPAP  ?  ?Past Surgical History:  ?Procedure Laterality Date  ? CARPAL TUNNEL RELEASE Right 08/05/2019  ? Procedure: RIGHT CARPAL TUNNEL RELEASE;  Surgeon: Cindee Salt, MD;  Location: [facility];  Service: Orthopedics;  Laterality: Right;  IV REGIONAL FOREARM BLOCK  ? CESAREAN SECTION  1994  ? ENDOMETRIAL ABLATION    ? GASTRIC BYPASS  2001  ? KIDNEY STONE SURGERY  2010  ? SBO  2015  ? ? ?Family Psychiatric History: Please see initial evaluation for full details. I have reviewed the history. No updates at this time.  ?  ? ?Family History:  ?Family History  ?Problem Relation Age of Onset  ? Diabetes Mother   ? Cancer Mother 1360  ?     Throat   ? Bipolar disorder Mother   ? Kidney disease Mother   ? Heart disease Father   ? Bipolar disorder Sister   ? Bipolar disorder Sister   ? Bipolar disorder Brother   ? Drug abuse Brother   ? Colon cancer Maternal Aunt   ? Kidney disease Maternal  Grandmother   ? Heart disease Paternal Grandfather   ? Cancer - Prostate Neg Hx   ? Breast cancer Neg Hx   ? Esophageal cancer Neg Hx   ? Rectal cancer Neg Hx   ? Stomach cancer Neg Hx   ? ? ?Social History:  ?Social History  ? ?Socioeconomic History  ? Marital status: Married  ?  Spouse name: Not on file  ? Number of children: 2  ? Years of education: Not on file  ? Highest education level: Not on file  ?Occupational History  ? Occupation: office administor  ?Tobacco Use  ? Smoking status: Former  ?  Packs/day: 0.25  ?  Years: 6.00  ?  Pack years: 1.50  ?  Types: Cigarettes  ? Smokeless tobacco: Never  ? Tobacco comments:  ?  1/2 ppd   ?Vaping Use  ? Vaping Use: Never used  ?Substance and Sexual Activity  ? Alcohol use: Yes  ?  Alcohol/week: 0.0 standard drinks  ?  Comment: Two times a week.   ? Drug use: No  ? Sexual activity: Yes  ?  Partners: Male  ?  Birth control/protection: Surgical  ?Other Topics Concern  ? Not on file  ?Social History Narrative  ?   ? 1 son born 1988-08-13 daughter born in [id number] she is married and lives with her husband  ? Prior smoker not current 3 caffeinated beverages a day no alcohol tobacco or drug use  ?   ? 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 Right handed   ? Caffeine 2 cups daily   ? ?Social Determinants of Health  ? ?Financial Resource Strain: Not on file  ?Food Insecurity: Not on file  ?Transportation Needs: Not on file  ?Physical Activity: Not on file  ?Stress: Not on file  ?Social Connections: Not on file  ? ? ?Allergies:  ?Allergies  ?Allergen Reactions  ? Nsaids Other (See Comments)  ?  Contraindicated with gastric bypass  ? ? ?Metabolic Disorder Labs: ?Lab Results  ?Component Value Date  ? HGBA1C 4.9 02/06/2017  ? MPG 105 02/26/2015  ? ?No results found for: PROLACTIN ?Lab Results  ?Component Value Date  ? CHOL 165 02/06/2017  ? TRIG 87 02/06/2017  ? HDL 54 02/06/2017  ? LDLCALC 94 02/06/2017  ? ?Lab Results  ?Component Value Date  ? TSH 1.12  07/10/2017  ? TSH 1.22 06/08/2016  ? ? ?Therapeutic Level Labs: ?No results found for: LITHIUM ?Lab Results  ?Component Value Date  ? VALPROATE 62 11/29/2020  ? VALPROATE 67 12/18/2019  ? ?No components found for:  CBMZ ? ?  Current Medications: ?Current Outpatient Medications  ?Medication Sig Dispense Refill  ? AMBULATORY NON FORMULARY MEDICATION nitroglycerin 0.125% gel . apply a pea size amount to your rectum three times daily x 6-8 weeks. (Patient not taking: Reported on 05/30/2021) 30 g 0  ? amphetamine-dextroamphetamine (ADDERALL XR) 25 MG 24 hr capsule Take 1 capsule by mouth every morning. 30 capsule 0  ? amphetamine-dextroamphetamine (ADDERALL XR) 25 MG 24 hr capsule Take 1 capsule by mouth every morning. 30 capsule 0  ? amphetamine-dextroamphetamine (ADDERALL XR) 25 MG 24 hr capsule Take 1 capsule by mouth every morning. 30 capsule 0  ? Armodafinil 250 MG tablet Take 1 tablet (250 mg total) by mouth daily 30 tablet 5  ? divalproex (DEPAKOTE ER) 500 MG 24 hr tablet Take 4 tablets (2,000 mg total) by mouth at bedtime. 360 tablet 1  ? FLUoxetine (PROZAC) 20 MG capsule Take 3 capsules (60 mg total) by mouth daily. 270 capsule 0  ? FLUoxetine (PROZAC) 20 MG tablet Take 3 tablets (60 mg total) by mouth daily. 270 tablet 1  ? hydrOXYzine (ATARAX) 25 MG tablet Take 1 tablet (25 mg total) by mouth daily as needed for anxiety. 30 tablet 2  ? lamoTRIgine (LAMICTAL) 150 MG tablet Take 1 tablet (150 mg total) by mouth daily. 90 tablet 0  ? lubiprostone (AMITIZA) 24 MCG capsule Take 1 capsule (24 mcg total) by mouth 2 (two) times daily. 120 capsule 3  ? Lurasidone HCl 60 MG TABS Take 1 tablet (60 mg total) by mouth daily. 90 tablet 1  ? omeprazole (PRILOSEC) 20 MG capsule Take 1 capsule (20 mg total) by mouth 2 (two) times daily before a meal. Best to take on an empty stomach. 120 capsule 3  ? ?No current facility-administered medications for this visit.  ? ? ? ?Musculoskeletal: ?Strength & Muscle Tone:  N/A ?Gait &  Station:  N/A ?Patient leans: N/A ? ?Psychiatric Specialty Exam: ?Review of Systems  ?Psychiatric/Behavioral:  Positive for dysphoric mood and sleep disturbance. Negative for agitation, behavioral problems, confusion, dec

## 2021-08-05 ENCOUNTER — Telehealth: Payer: Self-pay

## 2021-08-05 ENCOUNTER — Other Ambulatory Visit (HOSPITAL_BASED_OUTPATIENT_CLINIC_OR_DEPARTMENT_OTHER): Payer: Self-pay

## 2021-08-05 NOTE — Telephone Encounter (Signed)
went on covermymeds.and submitted ther prior auth. ?

## 2021-08-05 NOTE — Telephone Encounter (Signed)
received noticed that a prior auth is needed on thne latuda ?

## 2021-08-08 ENCOUNTER — Other Ambulatory Visit (HOSPITAL_BASED_OUTPATIENT_CLINIC_OR_DEPARTMENT_OTHER): Payer: Self-pay

## 2021-08-08 NOTE — Telephone Encounter (Signed)
received notice from [employer] that the latuda was approved until [phone] ?

## 2021-08-09 ENCOUNTER — Encounter: Payer: Self-pay | Admitting: Psychiatry

## 2021-08-09 ENCOUNTER — Other Ambulatory Visit: Payer: Self-pay

## 2021-08-09 ENCOUNTER — Telehealth (INDEPENDENT_AMBULATORY_CARE_PROVIDER_SITE_OTHER): Payer: 59 | Admitting: Psychiatry

## 2021-08-09 DIAGNOSIS — F411 Generalized anxiety disorder: Secondary | ICD-10-CM | POA: Diagnosis not present

## 2021-08-09 DIAGNOSIS — F3131 Bipolar disorder, current episode depressed, mild: Secondary | ICD-10-CM

## 2021-08-09 NOTE — Patient Instructions (Signed)
Continue Depakote 2000 mg at night  ?Obtain labs (CBC, CMP, VPA) ?Continue fluoxetine 60 mg daily  ?Continue lamotrigine 150 mg daily  ?Continue latuda 60 mg daily  ?Continue hydroxyzine 25 mg daily as needed for anxiety  ?Next appointment: 5/15 at 3:30  ?

## 2021-08-11 ENCOUNTER — Emergency Department (INDEPENDENT_AMBULATORY_CARE_PROVIDER_SITE_OTHER)
Admission: RE | Admit: 2021-08-11 | Discharge: 2021-08-11 | Disposition: A | Discharge status: Home / Self Care | Payer: 59 | Source: Ambulatory Visit

## 2021-08-11 ENCOUNTER — Emergency Department (INDEPENDENT_AMBULATORY_CARE_PROVIDER_SITE_OTHER): Payer: 59

## 2021-08-11 ENCOUNTER — Other Ambulatory Visit: Payer: Self-pay

## 2021-08-11 VITALS — BP 145/93 | HR 88 | Temp 98.8°F | Resp 16 | Ht 64.0 in | Wt 175.0 lb

## 2021-08-11 DIAGNOSIS — M79645 Pain in left finger(s): Secondary | ICD-10-CM

## 2021-08-11 DIAGNOSIS — W5501XA Bitten by cat, initial encounter: Secondary | ICD-10-CM | POA: Diagnosis not present

## 2021-08-11 DIAGNOSIS — S60872A Other superficial bite of left wrist, initial encounter: Secondary | ICD-10-CM | POA: Diagnosis not present

## 2021-08-11 NOTE — ED Provider Notes (Signed)
?[facility] ? ? ? ?CSN: [id number] ?Arrival date & time: 08/11/21  1526 ? ? ?  ? ?History   ?Chief Complaint ?Chief Complaint  ?Patient presents with  ? Finger Injury  ?  Possibly broken - Entered by patient  ? ? ?HPI ?Ashley OatsJennifer S Franklin is a 51 y.o. female.  ? ?HPI 51 year old female presents with left fifth finger pain.  Patient reports experienced cat bite on June 10, 2021 of left wrist and has noticed left fifth finger pain.  Patient reports that she was initially called in antibiotics (Clindamycin 300 mg daily for 7 days) by her sister who is an NP for 7 days, patient reports swelling went down but pain never stopped.  PMH significant of tremor of both hands, morbid obesity, and tobacco use disorder. ? ?Past Medical History:  ?Diagnosis Date  ? Anemia   ? Anxiety   ? Bipolar affective disorder (HCC)   ? Blood transfusion without reported diagnosis   ? Bronchospasm with bronchitis, acute   ? Depression   ? Diabetes (HCC)   ? diet controlled  ? Kidney stone   ? Obesity   ? Right carpal tunnel syndrome 12/09/2018  ? SBO (small bowel obstruction) (HCC) 01/2014  ? Sleep apnea   ? uses CPAP  ? ? ?Patient Active Problem List  ? Diagnosis Date Noted  ? Tremor of both hands 09/01/2019  ? Bipolar disorder, in full remission, most recent episode depressed (HCC) 04/23/2019  ? Right carpal tunnel syndrome 12/09/2018  ? Closed nondisplaced fracture of fifth left metatarsal bone 05/10/2018  ? History of shingles 01/08/2018  ? Menopause 01/08/2018  ? Left ankle injury, subsequent encounter 08/04/2017  ? PCP NOTES >>>>>>>>>> 07/11/2017  ? Apneic episode 06/25/2015  ? Chronic knee pain 04/18/2015  ? Plantar fasciitis 04/18/2015  ? Tobacco use disorder 03/15/2015  ? Morbid obesity (HCC) 01/05/2015  ? Bipolar affective (HCC) 01/02/2015  ? ? ?Past Surgical History:  ?Procedure Laterality Date  ? CARPAL TUNNEL RELEASE Right 08/05/2019  ? Procedure: RIGHT CARPAL TUNNEL RELEASE;  Surgeon: Cindee SaltKuzma, Gary, MD;  Location: [facility]  [place];  Service: Orthopedics;  Laterality: Right;  IV REGIONAL FOREARM BLOCK  ? CESAREAN SECTION  1994  ? ENDOMETRIAL ABLATION    ? GASTRIC BYPASS  2001  ? KIDNEY STONE SURGERY  2010  ? SBO  2015  ? ? ?OB History   ? ? Gravida  ?2  ? Para  ?2  ? Term  ?2  ? Preterm  ?   ? AB  ?   ? Living  ?2  ?  ? ? SAB  ?   ? IAB  ?   ? Ectopic  ?   ? Multiple  ?   ? Live Births  ?2  ?   ?  ?  ? ? ? ?Home Medications   ? ?Prior to Admission medications   ?Medication Sig Start Date End Date Taking? Authorizing Provider  ?AMBULATORY NON FORMULARY MEDICATION nitroglycerin 0.125% gel . apply a pea size amount to your rectum three times daily x 6-8 weeks. ?Patient not taking: Reported on 05/30/2021 04/27/21   Jenel Lucksunningham, Scott E, MD  ?amphetamine-dextroamphetamine (ADDERALL XR) 25 MG 24 hr capsule Take 1 capsule by mouth every morning. 07/01/21     ?amphetamine-dextroamphetamine (ADDERALL XR) 25 MG 24 hr capsule Take 1 capsule by mouth every morning. 07/29/21     ?amphetamine-dextroamphetamine (ADDERALL XR) 25 MG 24 hr capsule Take 1 capsule by mouth every morning.  07/01/21     ?Armodafinil 250 MG tablet Take 1 tablet (250 mg total) by mouth daily 06/01/21     ?divalproex (DEPAKOTE ER) 500 MG 24 hr tablet Take 4 tablets (2,000 mg total) by mouth at bedtime. 03/29/21 09/25/21  [name], MD  ?FLUoxetine (PROZAC) 20 MG capsule Take 3 capsules (60 mg total) by mouth daily. 06/25/21 09/23/21  [name], MD  ?FLUoxetine (PROZAC) 20 MG tablet Take 3 tablets (60 mg total) by mouth daily. 03/31/21 09/27/21  [name], MD  ?hydrOXYzine (ATARAX) 25 MG tablet Take 1 tablet (25 mg total) by mouth daily as needed for anxiety. 06/01/21 08/30/21  [name], MD  ?lamoTRIgine (LAMICTAL) 150 MG tablet Take 1 tablet (150 mg total) by mouth daily. 07/28/21 10/26/21  [name], MD  ?lubiprostone (AMITIZA) 24 MCG capsule Take 1 capsule (24 mcg total) by mouth 2 (two) times daily. 04/27/21   Jenel Lucks, MD  ?omeprazole  (PRILOSEC) 20 MG capsule Take 1 capsule (20 mg total) by mouth 2 (two) times daily before a meal. Best to take on an empty stomach. 05/30/21   Jenel Lucks, MD  ? ? ?Family History ?Family History  ?Problem Relation Age of Onset  ? Diabetes Mother   ? Cancer Mother 30  ?     Throat   ? Bipolar disorder Mother   ? Kidney disease Mother   ? Heart disease Father   ? Bipolar disorder Sister   ? Bipolar disorder Sister   ? Bipolar disorder Brother   ? Drug abuse Brother   ? Colon cancer Maternal Aunt   ? Kidney disease Maternal Grandmother   ? Heart disease Paternal Grandfather   ? Cancer - Prostate Neg Hx   ? Breast cancer Neg Hx   ? Esophageal cancer Neg Hx   ? Rectal cancer Neg Hx   ? Stomach cancer Neg Hx   ? ? ?Social History ?Social History  ? ?Tobacco Use  ? Smoking status: Former  ?  Packs/day: 0.25  ?  Years: 6.00  ?  Pack years: 1.50  ?  Types: Cigarettes  ? Smokeless tobacco: Never  ? Tobacco comments:  ?  1/2 ppd   ?Vaping Use  ? Vaping Use: Never used  ?Substance Use Topics  ? Alcohol use: Yes  ?  Alcohol/week: 0.0 standard drinks  ?  Comment: Two times a week.   ? Drug use: No  ? ? ? ?Allergies   ?Nsaids ? ? ?Review of Systems ?Review of Systems  ?Musculoskeletal:   ?     Left fifth finger pain x2 months  ? ? ?Physical Exam ?Triage Vital Signs ?ED Triage Vitals  ?Enc Vitals Group  ?   BP 08/11/21 1554 (!) 145/93  ?   Pulse Rate 08/11/21 1554 88  ?   Resp 08/11/21 1554 16  ?   Temp 08/11/21 1554 98.8 ?F (37.1 ?C)  ?   Temp Source 08/11/21 1554 Oral  ?   SpO2 08/11/21 1554 100 %  ?   Weight 08/11/21 1555 175 lb (79.4 kg)  ?   Height 08/11/21 1555 5\' 4"  (1.626 m)  ?   Head Circumference --   ?   Peak Flow --   ?   Pain Score 08/11/21 1555 10  ?   Pain Loc --   ?   Pain Edu? --   ?   Excl. in GC? --   ? ?No data found. ? ?Updated Vital Signs ?BP 08/13/21)  145/93 (BP Location: Left Arm)   Pulse 88   Temp 98.8 ?F (37.1 ?C) (Oral)   Resp 16   Ht 5\' 4"  (1.626 m)   Wt 175 lb (79.4 kg)   SpO2 100%   BMI 30.04  kg/m?  ? ? ?Physical Exam ?Vitals and nursing note reviewed.  ?Constitutional:   ?   Appearance: She is normal weight.  ?HENT:  ?   Head: Normocephalic and atraumatic.  ?   Mouth/Throat:  ?   Mouth: Mucous membranes are moist.  ?   Pharynx: Oropharynx is clear.  ?Eyes:  ?   Extraocular Movements: Extraocular movements intact.  ?   Conjunctiva/sclera: Conjunctivae normal.  ?   Pupils: Pupils are equal, round, and reactive to light.  ?Cardiovascular:  ?   Rate and Rhythm: Normal rate and regular rhythm.  ?   Pulses: Normal pulses.  ?   Heart sounds: Normal heart sounds. No murmur heard. ?Pulmonary:  ?   Effort: Pulmonary effort is normal.  ?   Breath sounds: Normal breath sounds. No wheezing, rhonchi or rales.  ?Musculoskeletal:  ?   Cervical back: Normal range of motion and neck supple.  ?   Comments: Left hand (dorsum): TTP over proximal phalanx of fifth finger, grip is 3/5, neurosensory/neurovascular intact  ?Skin: ?   General: Skin is warm and dry.  ?Neurological:  ?   General: No focal deficit present.  ?   Mental Status: She is alert and oriented to person, place, and time. Mental status is at baseline.  ? ? ? ?UC Treatments / Results  ?Labs ?(all labs ordered are listed, but only abnormal results are displayed) ?Labs Reviewed - No data to display ? ?EKG ? ? ?Radiology ?DG Wrist Complete Left ? ?Result Date: 08/11/2021 ?CLINICAL DATA:  Cat bite EXAM: LEFT WRIST - COMPLETE 3+ VIEW COMPARISON:  None. FINDINGS: There is no evidence of fracture or dislocation. There is no evidence of arthropathy or other focal bone abnormality. Soft tissues are unremarkable. IMPRESSION: No radiographic abnormality is seen in the left wrist. Electronically Signed   By: 08/13/2021 M.D.   On: 08/11/2021 16:21  ? ?DG Hand Complete Left ? ?Result Date: 08/11/2021 ?CLINICAL DATA:  Cat bite EXAM: LEFT HAND - COMPLETE 3+ VIEW COMPARISON:  None. FINDINGS: There is no evidence of fracture or dislocation. There is no evidence of  arthropathy or other focal bone abnormality. Soft tissues are unremarkable. IMPRESSION: No radiographic abnormalities are seen in the left hand. Electronically Signed   By: 08/13/2021 M.D.   On: 08/11/2021 16:22   ?

## 2021-08-11 NOTE — ED Triage Notes (Addendum)
Cat bite, January 27 on Left wrist, Left fifth finger now having pain. Its patient's cat, with all shots, patients sister is a NP she called in ABT for 7 days, swelling went down pain never stopped. ?

## 2021-08-11 NOTE — Discharge Instructions (Addendum)
Advised patient left hand/left wrist x-ray revealed no acute osseous abnormality.  Advised patient to follow-up with [facility] orthopedic provider (contact information provided above on this AVS) for further evaluation of left fifth finger pain.  Additionally, provided Roseanne Kaufman, MD with [facility] in [place], [place] [phone] as backup for further evaluation of left fifth finger pain. ?

## 2021-08-29 ENCOUNTER — Telehealth: Payer: 59 | Admitting: Physician Assistant

## 2021-08-29 ENCOUNTER — Other Ambulatory Visit (HOSPITAL_BASED_OUTPATIENT_CLINIC_OR_DEPARTMENT_OTHER): Payer: Self-pay

## 2021-08-29 DIAGNOSIS — B001 Herpesviral vesicular dermatitis: Secondary | ICD-10-CM | POA: Diagnosis not present

## 2021-08-29 MED ORDER — VALACYCLOVIR HCL 1 G PO TABS
2000.0000 mg | ORAL_TABLET | Freq: Two times a day (BID) | ORAL | 0 refills | Status: AC
  Filled 2021-08-29: qty 4, 1d supply, fill #0

## 2021-08-29 NOTE — Progress Notes (Signed)
We are sorry that you are not feeling well.  Here is how we plan to help! ? ?Based on what you have shared with me it does look like you have a viral infection.   ? ?Most cold sores or fever blisters are small fluid filled blisters around the mouth caused by herpes simplex virus.  The most common strain of the virus causing cold sores is herpes simplex virus 1.  It can be spread by skin contact, sharing eating utensils, or even sharing towels.  Cold sores are contagious to other people until dry. (Approximately 5-7 days).  Wash your hands. You can spread the virus to your eyes through handling your contact lenses after touching the lesions. ? ?Most people experience pain at the sight or tingling sensations in their lips that may begin before the ulcers erupt. ? ?Herpes simplex is treatable but not curable.  It may lie dormant for a long time and then reappear due to stress or prolonged sun exposure.  Many patients have success in treating their cold sores with an over the counter topical called Abreva.  You may apply the cream up to 5 times daily (maximum 10 days) until healing occurs. ? ?If you would like to use an oral antiviral medication to speed the healing of your cold sore, I have sent a prescription to your local pharmacy Valacyclovir 2 gm take one by mouth twice a day for 1 day   ? ?HOME CARE: ? ?Wash your hands frequently. ?Do not pick at or rub the sore. ?Don't open the blisters. ?Avoid kissing other people during this time. ?Avoid sharing drinking glasses, eating utensils, or razors. ?Do not handle contact lenses unless you have thoroughly washed your hands with soap and warm water! ?Avoid oral sex during this time.  Herpes from sores on your mouth can spread to your partner's genital area. ?Avoid contact with anyone who has eczema or a weakened immune system. ?Cold sores are often triggered by exposure to intense sunlight, use a lip balm containing a sunscreen (SPF 30 or higher). ? ?GET HELP RIGHT AWAY  IF: ? ?Blisters look infected. ?Blisters occur near or in the eye. ?Symptoms last longer than 10 days. ?Your symptoms become worse. ? ?MAKE SURE YOU: ? ?Understand these instructions. ?Will watch your condition. ?Will get help right away if you are not doing well or get worse. ? ?  ?Your e-visit answers were reviewed by a board certified advanced clinical practitioner to complete your personal care plan.  Depending upon the condition, your plan could have  Included both over the counter or prescription medications.   ? ?Please review your pharmacy choice.  Be sure that the pharmacy you have chosen is open so that you can pick up your prescription now.  If there is a problem you can message your provider in MyChart to have the prescription routed to another pharmacy.   ? ?Your safety is important to us.  If you have drug allergies check our prescription carefully. ? ?For the next 24 hours you can use MyChart to ask questions about today's visit, request a non-urgent call back, or ask for a work or school excuse from your e-visit provider. ? ?You will get an email in the next two days asking about your experience.  I hope that your e-visit has been valuable and will speed your recovery. ? ? ?I provided 5 minutes of non face-to-face time during this encounter for chart review and documentation.  ? ?

## 2021-09-24 NOTE — Progress Notes (Signed)
Virtual Visit via Video Note ? ?I connected with Ashley Franklin on 09/26/21 at  3:30 PM EDT by a video enabled telemedicine application and verified that I am speaking with the correct person using two identifiers. ? ?Location: ?Patient: home ?Provider: office ?Persons participated in the visit- patient, provider  ?  ?I discussed the limitations of evaluation and management by telemedicine and the availability of in person appointments. The patient expressed understanding and agreed to proceed. ? ?  ?I discussed the assessment and treatment plan with the patient. The patient was provided an opportunity to ask questions and all were answered. The patient agreed with the plan and demonstrated an understanding of the instructions. ?  ?The patient was advised to call back or seek an in-person evaluation if the symptoms worsen or if the condition fails to improve as anticipated. ? ?I provided 17 minutes of non-face-to-face time during this encounter. ? ? ?[name], MD ? ? ? ?BH MD/PA/NP OP Progress Note ? ?09/26/2021 4:10 PM ?Ashley Franklin  ?MRN:  [id number] ? ?Chief Complaint:  ?Chief Complaint  ?Patient presents with  ? Follow-up  ? Other  ? ?HPI:  ?This is a follow-up appointment for bipolar disorder and anxiety.  ?She states that her mother's condition is not good.  Her mother also mentions that she may not continue the treatment.  Spring has not been able to explore it with her yet.  She is concerned about her father.  She also finds it selfish to recommend her to continue the treatment.  She has done a semester for school.  She has another semester in fall.  She thinks she did well in the school, considering the stress she had.  She had a bonus at work, and she has received positive feedback so far.  She has middle insomnia lately.  Although she feels depressed at times, it has been manageable.  Although she feels overwhelmed at times, she does not walk away, and has been able to work through things.  She  feels anxious at times especially when she is in public.  She takes hydroxyzine twice a week for anxiety.  She has fair focus.  She denies decreased need for sleep or euphonia.  She denies SI.  She feels comfortable to stay on the current medication regimen.  ? 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 her daughter obtained PHD in pharmacy, her son bought a house ?  ? ?Visit Diagnosis:  ?  ICD-10-CM   ?1. Anxiety disorder, unspecified type  F41.9   ?  ?2. Bipolar affective disorder, currently depressed, mild (HCC)  F31.31   ?  ? ? ?Past Psychiatric History: Please see initial evaluation for full details. I have reviewed the history. No updates at this time.  ? ? ?Past Medical History:  ?Past Medical History:  ?Diagnosis Date  ? Anemia   ? Anxiety   ? Bipolar affective disorder (HCC)   ? Blood transfusion without reported diagnosis   ? Bronchospasm with bronchitis, acute   ? Depression   ? Diabetes (HCC)   ? diet controlled  ? Kidney stone   ? Obesity   ? Right carpal tunnel syndrome 12/09/2018  ? SBO (small bowel obstruction) (HCC) 01/2014  ? Sleep apnea   ? uses CPAP  ?  ?Past Surgical History:  ?Procedure Laterality Date  ? CARPAL TUNNEL RELEASE Right  08/05/2019  ? Procedure: RIGHT CARPAL TUNNEL RELEASE;  Surgeon: Cindee SaltKuzma, Gary, MD;  Location: [facility];  Service: Orthopedics;  Laterality: Right;  IV REGIONAL FOREARM BLOCK  ? CESAREAN SECTION  1994  ? ENDOMETRIAL ABLATION    ? GASTRIC BYPASS  2001  ? KIDNEY STONE SURGERY  2010  ? SBO  2015  ? ? ?Family Psychiatric History: Please see initial evaluation for full details. I have reviewed the history. No updates at this time.  ?  ? ?Family History:  ?Family History  ?Problem Relation Age of Onset  ? Diabetes Mother   ? Cancer Mother [id number]  ?     Throat   ? Bipolar disorder Mother   ? Kidney disease Mother    ? Heart disease Father   ? Bipolar disorder Sister   ? Bipolar disorder Sister   ? Bipolar disorder Brother   ? Drug abuse Brother   ? Colon cancer Maternal Aunt   ? Kidney disease Maternal Grandmother   ? Heart disease Paternal Grandfather   ? Cancer - Prostate Neg Hx   ? Breast cancer Neg Hx   ? Esophageal cancer Neg Hx   ? Rectal cancer Neg Hx   ? Stomach cancer Neg Hx   ? ? ?Social History:  ?Social History  ? ?Socioeconomic History  ? Marital status: Married  ?  Spouse name: Not on file  ? Number of children: 2  ? Years of education: Not on file  ? Highest education level: Not on file  ?Occupational History  ? Occupation: office administor  ?Tobacco Use  ? Smoking status: Former  ?  Packs/day: 0.25  ?  Years: 6.00  ?  Pack years: 1.50  ?  Types: Cigarettes  ? Smokeless tobacco: Never  ? Tobacco comments:  ?  1/2 ppd   ?Vaping Use  ? Vaping Use: Never used  ?Substance and Sexual Activity  ? Alcohol use: Yes  ?  Alcohol/week: 0.0 standard drinks  ?  Comment: Two times a week.   ? Drug use: No  ? Sexual activity: Yes  ?  Partners: Male  ?  Birth control/protection: Surgical  ?Other Topics Concern  ? Not on file  ?Social History Narrative  ?   ? 1 son born 1988-08-13 daughter born in [id number] she is married and lives with her husband  ? Prior smoker not current 3 caffeinated beverages a day no alcohol tobacco or drug use  ?   ? 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 Right handed   ? Caffeine 2 cups daily   ? ?Social Determinants of Health  ? ?Financial Resource Strain: Not on file  ?Food Insecurity: Not on file  ?Transportation Needs: Not on file  ?Physical Activity: Not on file  ?Stress: Not on file  ?Social Connections: Not on file  ? ? ?Allergies:  ?Allergies  ?Allergen Reactions  ? Nsaids Other (See Comments)  ?  Contraindicated with gastric bypass  ? ? ?Metabolic Disorder Labs: ?Lab Results  ?Component Value Date  ? HGBA1C 4.9 02/06/2017  ? MPG 105 02/26/2015  ? ?No results found for:  PROLACTIN ?Lab Results  ?Component Value Date  ? CHOL 165 02/06/2017  ? TRIG 87 02/06/2017  ? HDL 54 02/06/2017  ? LDLCALC 94 02/06/2017  ? ?Lab Results  ?Component Value Date  ? TSH 1.12 07/10/2017  ? TSH 1.22 06/08/2016  ? ? ?Therapeutic Level Labs: ?No results found for: LITHIUM ?Lab  Results  ?Component Value Date  ? VALPROATE 62 11/29/2020  ? VALPROATE 67 12/18/2019  ? ?No components found for:  CBMZ ? ?Current Medications: ?Current Outpatient Medications  ?Medication Sig Dispense Refill  ? lurasidone (LATUDA) 40 MG TABS tablet Take 60 mg by mouth daily with breakfast.    ? AMBULATORY NON FORMULARY MEDICATION nitroglycerin 0.125% gel . apply a pea size amount to your rectum three times daily x 6-8 weeks. (Patient not taking: Reported on 05/30/2021) 30 g 0  ? amphetamine-dextroamphetamine (ADDERALL XR) 25 MG 24 hr capsule Take 1 capsule by mouth every morning. 30 capsule 0  ? amphetamine-dextroamphetamine (ADDERALL XR) 25 MG 24 hr capsule Take 1 capsule by mouth every morning. 30 capsule 0  ? amphetamine-dextroamphetamine (ADDERALL XR) 25 MG 24 hr capsule Take 1 capsule by mouth every morning. 30 capsule 0  ? Armodafinil 250 MG tablet Take 1 tablet (250 mg total) by mouth daily 30 tablet 5  ? divalproex (DEPAKOTE ER) 500 MG 24 hr tablet Take 4 tablets (2,000 mg total) by mouth at bedtime. 360 tablet 1  ? FLUoxetine (PROZAC) 20 MG capsule Take 3 capsules (60 mg total) by mouth daily. 270 capsule 0  ? FLUoxetine (PROZAC) 20 MG tablet Take 3 tablets (60 mg total) by mouth daily. 270 tablet 1  ? hydrOXYzine (ATARAX) 25 MG tablet Take 1 tablet (25 mg total) by mouth daily as needed for anxiety. 30 tablet 2  ? [START ON 10/27/2021] lamoTRIgine (LAMICTAL) 150 MG tablet Take 1 tablet (150 mg total) by mouth daily. 90 tablet 0  ? lubiprostone (AMITIZA) 24 MCG capsule Take 1 capsule (24 mcg total) by mouth 2 (two) times daily. 120 capsule 3  ? omeprazole (PRILOSEC) 20 MG capsule Take 1 capsule (20 mg total) by mouth 2 (two)  times daily before a meal. Best to take on an empty stomach. 120 capsule 3  ? ?No current facility-administered medications for this visit.  ? ? ? ?Musculoskeletal: ?Strength & Muscle Tone:  N/A ?Gait & Stati

## 2021-09-26 ENCOUNTER — Encounter: Payer: Self-pay | Admitting: Psychiatry

## 2021-09-26 ENCOUNTER — Telehealth (INDEPENDENT_AMBULATORY_CARE_PROVIDER_SITE_OTHER): Payer: Self-pay | Admitting: Psychiatry

## 2021-09-26 ENCOUNTER — Other Ambulatory Visit (HOSPITAL_BASED_OUTPATIENT_CLINIC_OR_DEPARTMENT_OTHER): Payer: Self-pay

## 2021-09-26 DIAGNOSIS — F419 Anxiety disorder, unspecified: Secondary | ICD-10-CM

## 2021-09-26 DIAGNOSIS — F3131 Bipolar disorder, current episode depressed, mild: Secondary | ICD-10-CM

## 2021-09-26 MED ORDER — LAMOTRIGINE 150 MG PO TABS
150.0000 mg | ORAL_TABLET | Freq: Every day | ORAL | 0 refills | Status: DC
  Filled 2021-09-26: qty 30, 30d supply, fill #0

## 2021-09-26 MED ORDER — HYDROXYZINE HCL 25 MG PO TABS
25.0000 mg | ORAL_TABLET | Freq: Every day | ORAL | 2 refills | Status: DC | PRN
  Filled 2021-09-26: qty 30, 30d supply, fill #0
  Filled 2021-11-11: qty 30, 30d supply, fill #1
  Filled 2021-12-15: qty 30, 30d supply, fill #2

## 2021-09-26 NOTE — Patient Instructions (Signed)
Continue Depakote 2000 mg at night  ?Obtain labs (CBC, CMP, VPA) ?Continue fluoxetine 60 mg daily  ?Continue lamotrigine 150 mg daily  ?Continue latuda 60 mg daily  ?Continue hydroxyzine 25 mg daily as needed for anxiety  ?Next appointment: 6/27 at 8 AM, in person ? ?

## 2021-10-11 ENCOUNTER — Other Ambulatory Visit (HOSPITAL_BASED_OUTPATIENT_CLINIC_OR_DEPARTMENT_OTHER): Payer: Self-pay

## 2021-11-01 ENCOUNTER — Other Ambulatory Visit (HOSPITAL_BASED_OUTPATIENT_CLINIC_OR_DEPARTMENT_OTHER): Payer: Self-pay

## 2021-11-02 NOTE — Progress Notes (Signed)
BH MD/PA/NP OP Progress Note  11/08/2021 11:32 AM Ashley Franklin  MRN:  [id number]  Chief Complaint:  Chief Complaint  Patient presents with   Follow-up   Other   HPI:  This is a follow-up appointment for bipolar disorder.  She states that she was found to have some issues with gastric pouch, which was likely causing her to vomit and other GI symptoms.  She will likely have a surgery for this.  She states that she was told to have review at work in June, although it has not happened yet.  She is concerned that she might lose her job.  She is in school, and has been doing well, although it is harsh.  She also thinks the school gives her positive, although she feels like she is failing in other areas.  She states that she has not obtained lab as she was not able to take medication consistently due to financial strain.  She has been this way for the past month.  She is willing to stay on Latuda and lamotrigine at this time.  She has depressive symptoms as in PHQ-9.  She feels fatigue as she is unable to take Adderall XR consistently. She denies SI.  She takes hydroxyzine every day for severe anxiety. No known episode of mania. She denies alcohol use or drug use.   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 her daughter obtained PHD in pharmacy, her son bought a house  Wt Readings from Last 3 Encounters:  11/08/21 205 lb 9.6 oz (93.3 kg)  08/11/21 175 lb (79.4 kg)  05/30/21 191 lb (86.6 kg)      Visit Diagnosis:    ICD-10-CM   1. Bipolar affective disorder, currently depressed, moderate (HCC)  F31.32     2. Anxiety disorder, unspecified type  F41.9       Past Psychiatric History: Please see initial evaluation for full details. I have reviewed the history. No updates at this time.     Past Medical History:  Past Medical History:   Diagnosis Date   Anemia    Anxiety    Bipolar affective disorder (HCC)    Blood transfusion without reported diagnosis    Bronchospasm with bronchitis, acute    Depression    Diabetes (HCC)    diet controlled   Kidney stone    Obesity    Right carpal tunnel syndrome 12/09/2018   SBO (small bowel obstruction) (HCC) 01/2014   Sleep apnea    uses CPAP    Past Surgical History:  Procedure Laterality Date   CARPAL TUNNEL RELEASE Right 08/05/2019   Procedure: RIGHT CARPAL TUNNEL RELEASE;  Surgeon: Cindee Salt, MD;  Location: [facility];  Service: Orthopedics;  Laterality: Right;  IV REGIONAL FOREARM BLOCK   CESAREAN SECTION  1994   ENDOMETRIAL ABLATION     GASTRIC BYPASS  2001   KIDNEY STONE SURGERY  2010   SBO  2015    Family Psychiatric History: Please see initial evaluation for full details. I have reviewed the history. No updates at this time.     Family History:  Family History  Problem Relation Age of Onset   Diabetes Mother    Cancer Mother 51       Throat    Bipolar disorder Mother    Kidney disease Mother    Heart disease Father  Bipolar disorder Sister    Bipolar disorder Sister    Bipolar disorder Brother    Drug abuse Brother    Colon cancer Maternal Aunt    Kidney disease Maternal Grandmother    Heart disease Paternal Grandfather    Cancer - Prostate Neg Hx    Breast cancer Neg Hx    Esophageal cancer Neg Hx    Rectal cancer Neg Hx    Stomach cancer Neg Hx     Social History:  Social History   Socioeconomic History   Marital status: Married    Spouse name: Not on file   Number of children: 2   Years of education: Not on file   Highest education level: Not on file  Occupational History   Occupation: office administor  Tobacco Use   Smoking status: Former    Packs/day: 0.25    Years: 6.00    Total pack years: 1.50    Types: Cigarettes   Smokeless tobacco: Never   Tobacco comments:    1/2 ppd   Vaping Use   Vaping Use:  Never used  Substance and Sexual Activity   Alcohol use: Yes    Alcohol/week: 0.0 standard drinks of alcohol    Comment: Two times a week.    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, music teacher   Right handed    Caffeine 2 cups daily    Social Determinants of Health   Financial Resource Strain: Not on file  Food Insecurity: Not on file  Transportation Needs: Not on file  Physical Activity: Not on file  Stress: Not on file  Social Connections: Not on file    Allergies:  Allergies  Allergen Reactions   Nsaids Other (See Comments)    Contraindicated with gastric bypass    Metabolic Disorder Labs: Lab Results  Component Value Date   HGBA1C 4.9 02/06/2017   MPG 105 02/26/2015   No results found for: "PROLACTIN" Lab Results  Component Value Date   CHOL 165 02/06/2017   TRIG 87 02/06/2017   HDL 54 02/06/2017   LDLCALC 94 02/06/2017   Lab Results  Component Value Date   TSH 1.12 07/10/2017   TSH 1.22 06/08/2016    Therapeutic Level Labs: No results found for: "LITHIUM" Lab Results  Component Value Date   VALPROATE 62 11/29/2020   VALPROATE 67 12/18/2019   No results found for: "CBMZ"  Current Medications: Current Outpatient Medications  Medication Sig Dispense Refill   amphetamine-dextroamphetamine (ADDERALL XR) 25 MG 24 hr capsule Take 1 capsule by mouth every morning. 30 capsule 0   Armodafinil 250 MG tablet Take 1 tablet (250 mg total) by mouth daily 30 tablet 5   hydrOXYzine (ATARAX) 25 MG tablet Take 1 tablet (25 mg total) by mouth daily as needed for anxiety. 30 tablet 2   lamoTRIgine (LAMICTAL) 150 MG tablet  Take 1 tablet (150 mg total) by mouth daily. 30 tablet 0   lubiprostone (AMITIZA) 24 MCG capsule Take 1 capsule (24 mcg total) by mouth 2 (two) times daily. 120  capsule 3   Lurasidone HCl 60 MG TABS Take 1 tablet (60 mg total) by mouth daily. 30 tablet 0   omeprazole (PRILOSEC) 20 MG capsule Take 1 capsule (20 mg total) by mouth 2 (two) times daily before a meal. Best to take on an empty stomach. 120 capsule 3   divalproex (DEPAKOTE ER) 500 MG 24 hr tablet Take by mouth. (Patient not taking: Reported on 11/08/2021)     FLUoxetine (PROZAC) 20 MG capsule Take 3 capsules (60 mg total) by mouth daily. (Patient not taking: Reported on 11/08/2021) 270 capsule 0   No current facility-administered medications for this visit.     Musculoskeletal: Strength & Muscle Tone:  Normal Gait & Station: normal Patient leans: N/A  Psychiatric Specialty Exam: Review of Systems  Psychiatric/Behavioral:  Positive for decreased concentration, dysphoric mood and sleep disturbance. Negative for agitation, behavioral problems, confusion, hallucinations, self-injury and suicidal ideas. The patient is nervous/anxious. The patient is not hyperactive.   All other systems reviewed and are negative.   Blood pressure (!) 149/85, pulse 97, temperature 97.9 F (36.6 C), temperature source Temporal, weight 205 lb 9.6 oz (93.3 kg).Body mass index is 35.29 kg/m.  General Appearance: Fairly Groomed  Eye Contact:  Good  Speech:  Clear and Coherent  Volume:  Normal  Mood:   not good  Affect:  Appropriate, Congruent, and Tearful  Thought Process:  Coherent  Orientation:  Full (Time, Place, and Person)  Thought Content: Logical   Suicidal Thoughts:  No  Homicidal Thoughts:  No  Memory:  Immediate;   Good  Judgement:  Good  Insight:  Good  Psychomotor Activity:  Normal  Concentration:  Concentration: Good and Attention Span: Good  Recall:  Good  Fund of Knowledge: Good  Language: Good  Akathisia:   No  Handed:  Right   AIMS (if indicated): not done  Assets:  Communication Skills Desire for Improvement  ADL's:  Intact  Cognition: WNL  Sleep:  Poor   Screenings: PHQ2-9    Flowsheet Row Office Visit from 11/08/2021 in [facility] Video Visit from 06/03/2021 in [facility] Video Visit from 02/25/2021 in [facility] Office Visit from 11/29/2020 in [facility] Video Visit from 08/31/2020 in [facility]  PHQ-2 Total Score 4 0 0 1 0  PHQ-9 Total Score 15 -- -- -- --      Flowsheet Row Office Visit from 11/08/2021 in [facility] ED from 08/11/2021 in [facility] Video Visit from 02/25/2021 in [facility]  C-SSRS RISK CATEGORY No Risk No Risk No Risk        Assessment and Plan:  Ashley Franklin is a 51 y.o. year old female with a history of bipolar disorder, anxiety, anemia, obesity s/p gastric bypass surgery, who presents for follow up appointment for below.    1. Bipolar affective disorder, currently depressed, moderate (HCC) 2. Anxiety disorder, unspecified type There has been worsening in depressive symptoms and anxiety in the context of non adherence to medication due to financial strain.  Other psychosocial stressors includes work, separation, school work (although she enjoys it), her mother, who was found to have cancer in spine.  Will continue Latuda and lamotrigine to target bipolar 2 disorder/depression.  Will hold Depakote and Prozac at this time due to her financial strain.  Will continue hydroxyzine as needed for anxiety.     # Narcolepsy No change- She  sees a specialist. Currently on adderall and armodafinil.  She was informed of potential risk of medication induced mania.  She has a follow up appointment.      Plan:  Continue lamotrigine 150 mg daily  Continue latuda  60 mg daily  Continue hydroxyzine 25 mg daily as needed for anxiety  Hold Depakote (was on 2000 mg), fluoxetine (was on 60 mg daily) Next appointment: 7/26 at 11:30 for 30 mins, video [email or web address] - on Adderall XR 25 mg daily, armodafinil for narcolepsy   Past trials of medication: Lexapro, Paxil, amitriptyline, Abilify (irritable), oxcarbazepine, perphenazine, Trifluoperazine, Klonopin, Valium. Vistaril, Ambien        This clinician has discussed the side effect associated with medication prescribed during this encounter. Please refer to notes in the previous encounters for more details.      Collaboration of Care: Collaboration of Care: Other N/A  Patient/Guardian was advised Release of Information must be obtained prior to any record release in order to collaborate their care with an outside provider. Patient/Guardian was advised if they have not already done so to contact the registration department to sign all necessary forms in order for [place] to release information regarding their care.   Consent: Patient/Guardian gives verbal consent for treatment and assignment of benefits for services provided during this visit. Patient/Guardian expressed understanding and agreed to proceed.    [name], MD 11/08/2021, 11:32 AM

## 2021-11-08 ENCOUNTER — Encounter: Payer: Self-pay | Admitting: Psychiatry

## 2021-11-08 ENCOUNTER — Ambulatory Visit (INDEPENDENT_AMBULATORY_CARE_PROVIDER_SITE_OTHER): Payer: 59 | Admitting: Psychiatry

## 2021-11-08 ENCOUNTER — Other Ambulatory Visit (HOSPITAL_BASED_OUTPATIENT_CLINIC_OR_DEPARTMENT_OTHER): Payer: Self-pay

## 2021-11-08 VITALS — BP 149/85 | HR 97 | Temp 97.9°F | Wt 205.6 lb

## 2021-11-08 DIAGNOSIS — F419 Anxiety disorder, unspecified: Secondary | ICD-10-CM | POA: Diagnosis not present

## 2021-11-08 DIAGNOSIS — F3132 Bipolar disorder, current episode depressed, moderate: Secondary | ICD-10-CM | POA: Diagnosis not present

## 2021-11-08 MED ORDER — LAMOTRIGINE 150 MG PO TABS
150.0000 mg | ORAL_TABLET | Freq: Every day | ORAL | 0 refills | Status: DC
  Filled 2021-11-08 – 2021-11-11 (×2): qty 30, 30d supply, fill #0

## 2021-11-08 MED ORDER — LURASIDONE HCL 60 MG PO TABS
60.0000 mg | ORAL_TABLET | Freq: Every day | ORAL | 0 refills | Status: DC
  Filled 2021-11-08 – 2021-11-11 (×2): qty 30, 30d supply, fill #0

## 2021-11-10 ENCOUNTER — Other Ambulatory Visit (HOSPITAL_BASED_OUTPATIENT_CLINIC_OR_DEPARTMENT_OTHER): Payer: Self-pay

## 2021-11-11 ENCOUNTER — Other Ambulatory Visit (HOSPITAL_BASED_OUTPATIENT_CLINIC_OR_DEPARTMENT_OTHER): Payer: Self-pay

## 2021-11-29 ENCOUNTER — Other Ambulatory Visit (HOSPITAL_BASED_OUTPATIENT_CLINIC_OR_DEPARTMENT_OTHER): Payer: Self-pay

## 2021-11-29 MED ORDER — AMPHETAMINE-DEXTROAMPHET ER 25 MG PO CP24
ORAL_CAPSULE | Freq: Every morning | ORAL | 0 refills | Status: DC
  Filled 2021-11-29: qty 30, 30d supply, fill #0

## 2021-11-29 MED ORDER — ARMODAFINIL 250 MG PO TABS
250.0000 mg | ORAL_TABLET | Freq: Every day | ORAL | 5 refills | Status: AC
  Filled 2021-11-29: qty 30, 30d supply, fill #0
  Filled 2022-01-09: qty 30, 30d supply, fill #1
  Filled 2022-02-10: qty 30, 30d supply, fill #2
  Filled 2022-04-05: qty 30, 30d supply, fill #3
  Filled 2022-05-02: qty 30, 30d supply, fill #4

## 2021-12-04 NOTE — Progress Notes (Unsigned)
Virtual Visit via Video Note  I connected with Ashley Franklin on 12/07/21 at 11:30 AM EDT by a video enabled telemedicine application and verified that I am speaking with the correct person using two identifiers.  Location: Patient: work Provider: office Persons participated in the visit- patient, provider    I discussed the limitations of evaluation and management by telemedicine and the availability of in person appointments. The patient expressed understanding and agreed to proceed.  I discussed the assessment and treatment plan with the patient. The patient was provided an opportunity to ask questions and all were answered. The patient agreed with the plan and demonstrated an understanding of the instructions.   The patient was advised to call back or seek an in-person evaluation if the symptoms worsen or if the condition fails to improve as anticipated.  I provided 15 minutes of non-face-to-face time during this encounter.   [name], MD    [facility] MD/PA/NP OP Progress Note  12/07/2021 12:04 PM Ashley Franklin  MRN:  [id number]  Chief Complaint:  Chief Complaint  Patient presents with   Follow-up   Other   HPI:  This is a follow-up appointment for bipolar disorder and anxiety.  She states that she was able to pass the class in the previous semester.  She feels good about this.  She is still waiting for evaluation work; she thinks it has been going well.  Her mother is receiving treatment.  It is difficult for her as she is not be able to be there as much as she wants.  The relationship with her husband has been going good.  She notices that she has started to pick her ear to the point of it bleeds.  She feels like pushing her gas pedal more than she wants.  Although she does not have panic attacks, she has anxiety attacks.  She sleeps 7 hours.  She denies change in appetite.  She denies SI. She does not take hydroxyzine as much as it has limited benefit. She denies decreased  need for sleep or euphonia.  She does not feel drowsy as much compared to before.  She denies alcohol use or drug use.  She is willing to try higher dose of lamotrigine at this time.    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 her daughter obtained PHD in pharmacy, her son bought a house  Visit Diagnosis:    ICD-10-CM   1. Bipolar affective disorder, currently depressed, mild (HCC)  F31.31     2. Anxiety disorder, unspecified type  F41.9       Past Psychiatric History: Please see initial evaluation for full details. I have reviewed the history. No updates at this time.     Past Medical History:  Past Medical History:  Diagnosis Date   Anemia    Anxiety    Bipolar affective disorder (HCC)    Blood transfusion without reported diagnosis    Bronchospasm with bronchitis, acute    Depression    Diabetes (HCC)    diet controlled   Kidney stone    Obesity    Right carpal tunnel syndrome 12/09/2018   SBO (small bowel obstruction) (HCC) 01/2014   Sleep apnea    uses CPAP    Past Surgical History:  Procedure Laterality Date   CARPAL TUNNEL RELEASE Right 08/05/2019   Procedure: RIGHT CARPAL TUNNEL RELEASE;  Surgeon: Cindee Salt, MD;  Location: [facility];  Service: Orthopedics;  Laterality: Right;  IV REGIONAL FOREARM BLOCK   CESAREAN SECTION  1994   ENDOMETRIAL ABLATION     GASTRIC BYPASS  2001   KIDNEY STONE SURGERY  2010   SBO  2015    Family Psychiatric History: Please see initial evaluation for full details. I have reviewed the history. No updates at this time.     Family History:  Family History  Problem Relation Age of Onset   Diabetes Mother    Cancer Mother 35       Throat    Bipolar disorder Mother    Kidney disease Mother    Heart disease Father    Bipolar disorder Sister    Bipolar disorder  Sister    Bipolar disorder Brother    Drug abuse Brother    Colon cancer Maternal Aunt    Kidney disease Maternal Grandmother    Heart disease Paternal Grandfather    Cancer - Prostate Neg Hx    Breast cancer Neg Hx    Esophageal cancer Neg Hx    Rectal cancer Neg Hx    Stomach cancer Neg Hx     Social History:  Social History   Socioeconomic History   Marital status: Married    Spouse name: Not on file   Number of children: 2   Years of education: Not on file   Highest education level: Not on file  Occupational History   Occupation: office administor  Tobacco Use   Smoking status: Former    Packs/day: 0.25    Years: 6.00    Total pack years: 1.50    Types: Cigarettes   Smokeless tobacco: Never   Tobacco comments:    1/2 ppd   Vaping Use   Vaping Use: Never used  Substance and Sexual Activity   Alcohol use: Yes    Alcohol/week: 0.0 standard drinks of alcohol    Comment: Two times a week.    Drug use: No   Sexual activity: Yes    Partners: Male    Birth control/protection: Surgical  Other Topics Concern   Not on file  Social History Narrative      1 son born 1988-08-13 daughter born in 60 she is married and lives with her husband   Prior smoker not current 3 caffeinated beverages a day no alcohol tobacco or drug use      2 independent Acupuncturist schools, music teacher   Right handed    Caffeine 2 cups daily    Social Determinants of Health   Financial Resource Strain: Not on file  Food Insecurity: Not on file  Transportation Needs: Not on file  Physical Activity: Not on file  Stress: Not on file  Social Connections: Not on file    Allergies:  Allergies  Allergen Reactions   Nsaids Other (See Comments)    Contraindicated with gastric bypass    Metabolic Disorder Labs: Lab Results  Component Value Date   HGBA1C 4.9 02/06/2017   MPG 105 02/26/2015   No results found for: "PROLACTIN" Lab Results  Component Value Date   CHOL 165  02/06/2017   TRIG 87 02/06/2017   HDL 54 02/06/2017   LDLCALC 94 02/06/2017   Lab Results  Component Value Date   TSH 1.12 07/10/2017   TSH 1.22 06/08/2016    Therapeutic Level Labs: No results found for: "LITHIUM" Lab Results  Component Value Date  VALPROATE 62 11/29/2020   VALPROATE 67 12/18/2019   No results found for: "CBMZ"  Current Medications: Current Outpatient Medications  Medication Sig Dispense Refill   amphetamine-dextroamphetamine (ADDERALL XR) 25 MG 24 hr capsule Take 1 capsule by mouth every morning. 30 capsule 0   Armodafinil 250 MG tablet Take 1 tablet (250 mg total) by mouth daily 30 tablet 5   hydrOXYzine (ATARAX) 25 MG tablet Take 1 tablet (25 mg total) by mouth daily as needed for anxiety. 30 tablet 2   lamoTRIgine (LAMICTAL) 200 MG tablet Take 1 tablet (200 mg total) by mouth daily. 30 tablet 0   lubiprostone (AMITIZA) 24 MCG capsule Take 1 capsule (24 mcg total) by mouth 2 (two) times daily. 120 capsule 3   [START ON 12/12/2021] Lurasidone HCl 60 MG TABS Take 1 tablet (60 mg total) by mouth daily. 30 tablet 0   omeprazole (PRILOSEC) 20 MG capsule Take 1 capsule (20 mg total) by mouth 2 (two) times daily before a meal. Best to take on an empty stomach. 120 capsule 3   No current facility-administered medications for this visit.     Musculoskeletal: Strength & Muscle Tone:  N/A Gait & Station:  N/A Patient leans: N/A  Psychiatric Specialty Exam: Review of Systems  Psychiatric/Behavioral:  Negative for agitation, behavioral problems, confusion, decreased concentration, dysphoric mood, hallucinations, self-injury, sleep disturbance and suicidal ideas. The patient is nervous/anxious. The patient is not hyperactive.   All other systems reviewed and are negative.   There were no vitals taken for this visit.There is no height or weight on file to calculate BMI.  General Appearance: Fairly Groomed  Eye Contact:  Good  Speech:  Clear and Coherent  Volume:   Normal  Mood:  Anxious  Affect:  Appropriate, Congruent, and calm, smile  Thought Process:  Coherent  Orientation:  Full (Time, Place, and Person)  Thought Content: Logical   Suicidal Thoughts:  No  Homicidal Thoughts:  No  Memory:  Immediate;   Good  Judgement:  Good  Insight:  Good  Psychomotor Activity:  Normal  Concentration:  Concentration: Good and Attention Span: Good  Recall:  Good  Fund of Knowledge: Good  Language: Good  Akathisia:  No  Handed:  Right  AIMS (if indicated): not done  Assets:  Communication Skills Desire for Improvement  ADL's:  Intact  Cognition: WNL  Sleep:  Fair   Screenings: PHQ2-9    Flowsheet Row Office Visit from 11/08/2021 in [facility] Video Visit from 06/03/2021 in [facility] Video Visit from 02/25/2021 in [facility] Office Visit from 11/29/2020 in [facility] Video Visit from 08/31/2020 in [facility]  PHQ-2 Total Score 4 0 0 1 0  PHQ-9 Total Score 15 -- -- -- --      Flowsheet Row Office Visit from 11/08/2021 in [facility] ED from 08/11/2021 in [facility] Video Visit from 02/25/2021 in [facility]  C-SSRS RISK CATEGORY No Risk No Risk No Risk        Assessment and Plan:  LINNETTE PANELLA is a 51 y.o. year old female with a history of bipolar disorder, anxiety, anemia, obesity s/p gastric bypass surgery, who presents for follow up appointment for below.   1. Bipolar affective disorder, currently depressed, mild (HCC) 2. Anxiety disorder, unspecified type She reports worsening racing thoughts, although there has been overall improvement in depressive symptoms since restarting  some of her medication.  Psychosocial stressors includes work, separation, school work (although she enjoys it), her mother, who was  found to have cancer in spine.  Will titrate lamotrigine to optimize treatment for bipolar disorder.  Discussed potential risk of Stevens-Johnson syndrome.  Will continue Latuda to target bipolar depression.  Will continue hydroxyzine as needed for anxiety.   # Narcolepsy Improving.  She sees a specialist. Currently on adderall and armodafinil.  She was informed of potential risk of medication induced mania.  She will have annual follow up.      Plan:  Increase lamotrigine 200 mg daily  Continue latuda 60 mg daily  Continue hydroxyzine 25 mg daily as needed for anxiety  Next appointment: 8/25 at 9:30 for 30 mins, video [email or web address] - on Adderall XR 25 mg daily, armodafinil for narcolepsy - was on Depakote (was on 2000 mg), fluoxetine (was on 60 mg daily)   Past trials of medication: Lexapro, Paxil, amitriptyline, Abilify (irritable), oxcarbazepine, perphenazine, Trifluoperazine, Klonopin, Valium. Vistaril, Ambien     Collaboration of Care: Collaboration of Care: Other N/A  Patient/Guardian was advised Release of Information must be obtained prior to any record release in order to collaborate their care with an outside provider. Patient/Guardian was advised if they have not already done so to contact the registration department to sign all necessary forms in order for [place] to release information regarding their care.   Consent: Patient/Guardian gives verbal consent for treatment and assignment of benefits for services provided during this visit. Patient/Guardian expressed understanding and agreed to proceed.    [name], MD 12/07/2021, 12:04 PM

## 2021-12-07 ENCOUNTER — Telehealth (INDEPENDENT_AMBULATORY_CARE_PROVIDER_SITE_OTHER): Payer: 59 | Admitting: Psychiatry

## 2021-12-07 ENCOUNTER — Other Ambulatory Visit (HOSPITAL_BASED_OUTPATIENT_CLINIC_OR_DEPARTMENT_OTHER): Payer: Self-pay

## 2021-12-07 ENCOUNTER — Encounter: Payer: Self-pay | Admitting: Psychiatry

## 2021-12-07 DIAGNOSIS — F419 Anxiety disorder, unspecified: Secondary | ICD-10-CM

## 2021-12-07 DIAGNOSIS — F3131 Bipolar disorder, current episode depressed, mild: Secondary | ICD-10-CM | POA: Diagnosis not present

## 2021-12-07 MED ORDER — LURASIDONE HCL 60 MG PO TABS
60.0000 mg | ORAL_TABLET | Freq: Every day | ORAL | 0 refills | Status: DC
  Filled 2021-12-07: qty 30, 30d supply, fill #0

## 2021-12-07 MED ORDER — LAMOTRIGINE 200 MG PO TABS
200.0000 mg | ORAL_TABLET | Freq: Every day | ORAL | 0 refills | Status: DC
  Filled 2021-12-07: qty 30, 30d supply, fill #0

## 2021-12-07 NOTE — Patient Instructions (Signed)
Increase lamotrigine 200 mg daily  Continue latuda 60 mg daily  Continue hydroxyzine 25 mg daily as needed for anxiety  Next appointment: 8/25 at 9:30, video

## 2021-12-15 ENCOUNTER — Other Ambulatory Visit (HOSPITAL_BASED_OUTPATIENT_CLINIC_OR_DEPARTMENT_OTHER): Payer: Self-pay

## 2022-01-05 NOTE — Progress Notes (Signed)
Virtual Visit via Video Note  I connected with Ashley Franklin on 01/06/22 at  9:30 AM EDT by a video enabled telemedicine application and verified that I am speaking with the correct person using two identifiers.  Location: Patient: home Provider: office Persons participated in the visit- patient, provider    I discussed the limitations of evaluation and management by telemedicine and the availability of in person appointments. The patient expressed understanding and agreed to proceed.    I discussed the assessment and treatment plan with the patient. The patient was provided an opportunity to ask questions and all were answered. The patient agreed with the plan and demonstrated an understanding of the instructions.   The patient was advised to call back or seek an in-person evaluation if the symptoms worsen or if the condition fails to improve as anticipated.  I provided 20 minutes of non-face-to-face time during this encounter.   [name], MD    [facility] MD/PA/NP OP Progress Note  01/06/2022 10:43 AM Ashley Franklin  MRN:  [id number]  Chief Complaint:  Chief Complaint  Patient presents with   Follow-up   Other   HPI:  This is a follow-up appointment for bipolar disorder.  She states that she is unsure if this is good.  She feels very good lately.  She talks about an example of runner, who runs from [place], and rolling down hills.  She feels that her mind races constantly, and is looking for more to do, although her body is tired.  She denies any impulsive behaviors.  She has been getting positive comments at work.  She feels terrified about the way she is feeling as she does not have any comparison of her baseline.  She sleeps 4 to 5 hours.  She denies change in appetite.  She denies feeling depressed.  She denies SI, HI, hallucinations, paranoia.  She denies irritability.  She has been taking medication consistently.  She feels comfortable to try higher dose of Latuda at this  time.   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 her daughter obtained PHD in pharmacy, her son bought a house  Visit Diagnosis:    ICD-10-CM   1. Bipolar affective disorder, currently manic, mild (HCC)  F31.11     2. Anxiety state  F41.1       Past Psychiatric History: Please see initial evaluation for full details. I have reviewed the history. No updates at this time.     Past Medical History:  Past Medical History:  Diagnosis Date   Anemia    Anxiety    Bipolar affective disorder (HCC)    Blood transfusion without reported diagnosis    Bronchospasm with bronchitis, acute    Depression    Diabetes (HCC)    diet controlled   Kidney stone    Obesity    Right carpal tunnel syndrome 12/09/2018   SBO (small bowel obstruction) (HCC) 01/2014   Sleep apnea    uses CPAP    Past Surgical History:  Procedure Laterality Date   CARPAL TUNNEL RELEASE Right 08/05/2019   Procedure: RIGHT CARPAL TUNNEL RELEASE;  Surgeon: Cindee Salt, MD;  Location: [facility];  Service: Orthopedics;  Laterality: Right;  IV REGIONAL FOREARM BLOCK   CESAREAN SECTION  1994   ENDOMETRIAL ABLATION     GASTRIC BYPASS  2001   KIDNEY STONE SURGERY  2010   SBO  2015    Family Psychiatric History: Please see initial evaluation for full details. I have reviewed the history. No updates at this time.     Family History:  Family History  Problem Relation Age of Onset   Diabetes Mother    Cancer Mother 67       Throat    Bipolar disorder Mother    Kidney disease Mother    Heart disease Father    Bipolar disorder Sister    Bipolar disorder Sister    Bipolar disorder Brother    Drug abuse Brother    Colon cancer Maternal Aunt    Kidney disease Maternal Grandmother    Heart disease Paternal Grandfather    Cancer - Prostate Neg Hx     Breast cancer Neg Hx    Esophageal cancer Neg Hx    Rectal cancer Neg Hx    Stomach cancer Neg Hx     Social History:  Social History   Socioeconomic History   Marital status: Married    Spouse name: Not on file   Number of children: 2   Years of education: Not on file   Highest education level: Not on file  Occupational History   Occupation: office administor  Tobacco Use   Smoking status: Former    Packs/day: 0.25    Years: 6.00    Total pack years: 1.50    Types: Cigarettes   Smokeless tobacco: Never   Tobacco comments:    1/2 ppd   Vaping Use   Vaping Use: Never used  Substance and Sexual Activity   Alcohol use: Yes    Alcohol/week: 0.0 standard drinks of alcohol    Comment: Two times a week.    Drug use: No   Sexual activity: Yes    Partners: Male    Birth control/protection: Surgical  Other Topics Concern   Not on file  Social History Narrative      1 son born 1988-08-13 daughter born in 65 she is married and lives with her husband   Prior smoker not current 3 caffeinated beverages a day no alcohol tobacco or drug use      2 independent Acupuncturist schools, music teacher   Right handed    Caffeine 2 cups daily    Social Determinants of Health   Financial Resource Strain: Not on file  Food Insecurity: Not on file  Transportation Needs: Not on file  Physical Activity: Not on file  Stress: Not on file  Social Connections: Not on file    Allergies:  Allergies  Allergen Reactions   Nsaids Other (See Comments)    Contraindicated with gastric bypass    Metabolic Disorder Labs: Lab Results  Component Value Date   HGBA1C 4.9 02/06/2017   MPG 105 02/26/2015   No results found for: "PROLACTIN" Lab Results  Component Value Date   CHOL 165 02/06/2017   TRIG 87 02/06/2017   HDL 54 02/06/2017   LDLCALC 94 02/06/2017   Lab Results  Component Value Date   TSH 1.12 07/10/2017   TSH 1.22 06/08/2016    Therapeutic Level Labs: No  results found for: "LITHIUM" Lab Results  Component Value Date   VALPROATE 62 11/29/2020   VALPROATE 67 12/18/2019   No results found for: "CBMZ"  Current Medications: Current Outpatient Medications  Medication Sig Dispense Refill   lurasidone (LATUDA) 80 MG TABS tablet Take 1 tablet (80 mg total) by mouth daily with  breakfast. 30 tablet 1   amphetamine-dextroamphetamine (ADDERALL XR) 25 MG 24 hr capsule Take 1 capsule by mouth every morning. 30 capsule 0   Armodafinil 250 MG tablet Take 1 tablet (250 mg total) by mouth daily 30 tablet 5   [START ON 01/14/2022] hydrOXYzine (ATARAX) 25 MG tablet Take 1 tablet (25 mg total) by mouth daily as needed for anxiety. 30 tablet 2   [START ON 01/15/2022] lamoTRIgine (LAMICTAL) 200 MG tablet Take 1 tablet (200 mg total) by mouth daily. 30 tablet 1   lubiprostone (AMITIZA) 24 MCG capsule Take 1 capsule (24 mcg total) by mouth 2 (two) times daily. 120 capsule 3   Lurasidone HCl 60 MG TABS Take 1 tablet (60 mg total) by mouth daily. 30 tablet 0   omeprazole (PRILOSEC) 20 MG capsule Take 1 capsule (20 mg total) by mouth 2 (two) times daily before a meal. Best to take on an empty stomach. 120 capsule 3   No current facility-administered medications for this visit.     Musculoskeletal: Strength & Muscle Tone:  N/A Gait & Station:  N/A Patient leans: N/A  Psychiatric Specialty Exam: Review of Systems  Psychiatric/Behavioral:  Negative for agitation, behavioral problems, confusion, decreased concentration, dysphoric mood, hallucinations, self-injury, sleep disturbance and suicidal ideas. The patient is nervous/anxious. The patient is not hyperactive.   All other systems reviewed and are negative.   There were no vitals taken for this visit.There is no height or weight on file to calculate BMI.  General Appearance: Fairly Groomed  Eye Contact:  Good  Speech:  Clear and Coherent  Volume:  Normal  Mood:   good  Affect:  Appropriate, Congruent, and calm   Thought Process:  Coherent  Orientation:  Full (Time, Place, and Person)  Thought Content: Logical   Suicidal Thoughts:  No  Homicidal Thoughts:  No  Memory:  Immediate;   Good  Judgement:  Good  Insight:  Good  Psychomotor Activity:  Normal  Concentration:  Concentration: Good and Attention Span: Good  Recall:  Good  Fund of Knowledge: Good  Language: Good  Akathisia:  No  Handed:  Right  AIMS (if indicated): not done  Assets:  Communication Skills Desire for Improvement  ADL's:  Intact  Cognition: WNL  Sleep:  Fair   Screenings: PHQ2-9    Flowsheet Row Office Visit from 11/08/2021 in [facility] Video Visit from 06/03/2021 in [facility] Video Visit from 02/25/2021 in [facility] Office Visit from 11/29/2020 in [facility] Video Visit from 08/31/2020 in [facility]  PHQ-2 Total Score 4 0 0 1 0  PHQ-9 Total Score 15 -- -- -- --      Flowsheet Row Office Visit from 11/08/2021 in [facility] ED from 08/11/2021 in [facility] Video Visit from 02/25/2021 in [facility]  C-SSRS RISK CATEGORY No Risk No Risk No Risk        Assessment and Plan:  Ashley Franklin is a 51 y.o. year old female with a history of bipolar disorder, anxiety, anemia, obesity s/p gastric bypass surgery, who presents for follow up appointment for below.   1. Bipolar affective disorder, currently manic, mild (HCC) 2. Anxiety state Has been worsening in racing thoughts, although she denies any increased goal-directed activity, and is based on reality.  Psychosocial stressors includes work, separation, school work (although she enjoys it), her mother, who was found to have  cancer in spine.  Will uptitrate latuda to optimize treatment for bipolar disorder.  Discussed potential metabolic  side effect and EPS.  Will consider restarting Depakote to target mania if she has limited benefit from this.  Noted that this medication has been discontinued in the past due to financial strain.  Will continue lamotrigine to target bipolar disorder.  Will continue hydroxyzine as needed for anxiety.    # Narcolepsy Improving.  She sees a specialist. Currently on adderall and armodafinil.  She was informed of potential risk of medication induced mania.  She will have annual follow up.      Plan:  Continue lamotrigine 200 mg daily  Increase latuda 80 mg daily  Continue hydroxyzine 25 mg daily as needed for anxiety  Next appointment: 10/6 at 10 AM for 30 mins, video [email or web address] - on Adderall XR 25 mg daily, armodafinil for narcolepsy - was on Depakote (was on 2000 mg), fluoxetine (was on 60 mg daily)   Past trials of medication: Lexapro, Paxil, amitriptyline, Abilify (irritable), oxcarbazepine, perphenazine, Trifluoperazine, Klonopin, Valium. Vistaril, Ambien          Collaboration of Care: Collaboration of Care: Other N/A  Patient/Guardian was advised Release of Information must be obtained prior to any record release in order to collaborate their care with an outside provider. Patient/Guardian was advised if they have not already done so to contact the registration department to sign all necessary forms in order for [place] to release information regarding their care.   Consent: Patient/Guardian gives verbal consent for treatment and assignment of benefits for services provided during this visit. Patient/Guardian expressed understanding and agreed to proceed.    [name], MD 01/06/2022, 10:43 AM

## 2022-01-06 ENCOUNTER — Encounter: Payer: Self-pay | Admitting: Psychiatry

## 2022-01-06 ENCOUNTER — Telehealth (INDEPENDENT_AMBULATORY_CARE_PROVIDER_SITE_OTHER): Payer: 59 | Admitting: Psychiatry

## 2022-01-06 ENCOUNTER — Other Ambulatory Visit (HOSPITAL_BASED_OUTPATIENT_CLINIC_OR_DEPARTMENT_OTHER): Payer: Self-pay

## 2022-01-06 DIAGNOSIS — F411 Generalized anxiety disorder: Secondary | ICD-10-CM | POA: Diagnosis not present

## 2022-01-06 DIAGNOSIS — F3111 Bipolar disorder, current episode manic without psychotic features, mild: Secondary | ICD-10-CM

## 2022-01-06 MED ORDER — LAMOTRIGINE 200 MG PO TABS
200.0000 mg | ORAL_TABLET | Freq: Every day | ORAL | 1 refills | Status: DC
  Filled 2022-01-06: qty 30, 30d supply, fill #0

## 2022-01-06 MED ORDER — LURASIDONE HCL 80 MG PO TABS
80.0000 mg | ORAL_TABLET | Freq: Every day | ORAL | 1 refills | Status: DC
Start: 2022-01-06 — End: 2022-02-17
  Filled 2022-01-06: qty 30, 30d supply, fill #0
  Filled 2022-02-10: qty 30, 30d supply, fill #1

## 2022-01-06 MED ORDER — HYDROXYZINE HCL 25 MG PO TABS
25.0000 mg | ORAL_TABLET | Freq: Every day | ORAL | 2 refills | Status: DC | PRN
  Filled 2022-01-06 – 2022-01-09 (×2): qty 30, 30d supply, fill #0
  Filled 2022-02-10: qty 30, 30d supply, fill #1
  Filled 2022-05-02: qty 30, 30d supply, fill #2

## 2022-01-09 ENCOUNTER — Other Ambulatory Visit (HOSPITAL_BASED_OUTPATIENT_CLINIC_OR_DEPARTMENT_OTHER): Payer: Self-pay

## 2022-01-09 MED ORDER — AMPHETAMINE-DEXTROAMPHET ER 25 MG PO CP24
ORAL_CAPSULE | Freq: Every morning | ORAL | 0 refills | Status: DC
  Filled 2022-01-09: qty 30, 30d supply, fill #0

## 2022-01-10 ENCOUNTER — Other Ambulatory Visit (HOSPITAL_BASED_OUTPATIENT_CLINIC_OR_DEPARTMENT_OTHER): Payer: Self-pay

## 2022-02-10 ENCOUNTER — Other Ambulatory Visit (HOSPITAL_BASED_OUTPATIENT_CLINIC_OR_DEPARTMENT_OTHER): Payer: Self-pay

## 2022-02-10 MED ORDER — AMPHETAMINE-DEXTROAMPHET ER 25 MG PO CP24
25.0000 mg | ORAL_CAPSULE | Freq: Every morning | ORAL | 0 refills | Status: DC
  Filled 2022-02-10: qty 30, 30d supply, fill #0

## 2022-02-13 ENCOUNTER — Other Ambulatory Visit (HOSPITAL_BASED_OUTPATIENT_CLINIC_OR_DEPARTMENT_OTHER): Payer: Self-pay
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
## 2022-02-17 ENCOUNTER — Other Ambulatory Visit (HOSPITAL_BASED_OUTPATIENT_CLINIC_OR_DEPARTMENT_OTHER): Payer: Self-pay

## 2022-02-17 ENCOUNTER — Encounter: Payer: Self-pay | Admitting: Psychiatry

## 2022-02-17 ENCOUNTER — Telehealth (INDEPENDENT_AMBULATORY_CARE_PROVIDER_SITE_OTHER): Payer: 59 | Admitting: Psychiatry

## 2022-02-17 DIAGNOSIS — F411 Generalized anxiety disorder: Secondary | ICD-10-CM | POA: Diagnosis not present

## 2022-02-17 DIAGNOSIS — F3111 Bipolar disorder, current episode manic without psychotic features, mild: Secondary | ICD-10-CM | POA: Diagnosis not present

## 2022-02-17 MED ORDER — LURASIDONE HCL 80 MG PO TABS
80.0000 mg | ORAL_TABLET | Freq: Every day | ORAL | 1 refills | Status: DC
  Filled 2022-02-17 – 2022-02-24 (×2): qty 30, 30d supply, fill #0
  Filled 2022-04-05: qty 30, 30d supply, fill #1

## 2022-02-17 MED ORDER — LAMOTRIGINE 200 MG PO TABS
200.0000 mg | ORAL_TABLET | Freq: Every day | ORAL | 2 refills | Status: DC
  Filled 2022-02-17: qty 30, 30d supply, fill #0
  Filled 2022-05-02: qty 30, 30d supply, fill #1
  Filled 2022-06-05: qty 30, 30d supply, fill #2

## 2022-02-17 NOTE — Patient Instructions (Signed)
Continue lamotrigine 200 mg daily  Continue  latuda 80 mg daily  Continue hydroxyzine 25 mg daily as needed for anxiety  Next appointment: 11/15 at 8 AM

## 2022-02-24 ENCOUNTER — Other Ambulatory Visit (HOSPITAL_BASED_OUTPATIENT_CLINIC_OR_DEPARTMENT_OTHER): Payer: Self-pay

## 2022-02-27 ENCOUNTER — Other Ambulatory Visit (HOSPITAL_BASED_OUTPATIENT_CLINIC_OR_DEPARTMENT_OTHER): Payer: Self-pay

## 2022-02-28 ENCOUNTER — Other Ambulatory Visit (HOSPITAL_BASED_OUTPATIENT_CLINIC_OR_DEPARTMENT_OTHER): Payer: Self-pay

## 2022-02-28 MED ORDER — PANTOPRAZOLE SODIUM 40 MG PO TBEC
40.0000 mg | DELAYED_RELEASE_TABLET | Freq: Two times a day (BID) | ORAL | 0 refills | Status: DC
  Filled 2022-02-28: qty 60, 30d supply, fill #0
  Filled 2022-04-21: qty 60, 30d supply, fill #1
  Filled 2022-06-05: qty 60, 30d supply, fill #2

## 2022-03-01 ENCOUNTER — Telehealth: Payer: Self-pay

## 2022-03-01 NOTE — Telephone Encounter (Signed)
pt called left message tht the hydroxyzine is not helping with her anxiety.  she just feels so overwhelmed.

## 2022-03-01 NOTE — Telephone Encounter (Signed)
Pt last seen on 02-17-22 next appt  03-29-22

## 2022-03-01 NOTE — Telephone Encounter (Signed)
Could you ask if she is interested in having appointment at 2 pm/available slot? Advise her to take double dose of hydroxyzine (50 mg) as needed for anxiety to see if it works in the mean time.

## 2022-03-02 ENCOUNTER — Telehealth (INDEPENDENT_AMBULATORY_CARE_PROVIDER_SITE_OTHER): Payer: 59 | Admitting: Psychiatry

## 2022-03-02 ENCOUNTER — Encounter: Payer: Self-pay | Admitting: Psychiatry

## 2022-03-02 ENCOUNTER — Other Ambulatory Visit (HOSPITAL_BASED_OUTPATIENT_CLINIC_OR_DEPARTMENT_OTHER): Payer: Self-pay

## 2022-03-02 DIAGNOSIS — F3111 Bipolar disorder, current episode manic without psychotic features, mild: Secondary | ICD-10-CM | POA: Diagnosis not present

## 2022-03-02 DIAGNOSIS — F411 Generalized anxiety disorder: Secondary | ICD-10-CM | POA: Diagnosis not present

## 2022-03-02 MED ORDER — LORAZEPAM 0.5 MG PO TABS
0.5000 mg | ORAL_TABLET | Freq: Two times a day (BID) | ORAL | 0 refills | Status: DC | PRN
  Filled 2022-03-02: qty 30, 15d supply, fill #0

## 2022-03-02 MED ORDER — LURASIDONE HCL 20 MG PO TABS
20.0000 mg | ORAL_TABLET | Freq: Every day | ORAL | 0 refills | Status: DC
  Filled 2022-03-02: qty 30, 30d supply, fill #0

## 2022-03-02 NOTE — Progress Notes (Signed)
Virtual Visit via Video Note  I connected with Ashley Franklin on 03/02/22 at 11:30 AM EDT by a video enabled telemedicine application and verified that I am speaking with the correct person using two identifiers.  Location: Patient: home Provider: office Persons participated in the visit- patient, provider    I discussed the limitations of evaluation and management by telemedicine and the availability of in person appointments. The patient expressed understanding and agreed to proceed.    I discussed the assessment and treatment plan with the patient. The patient was provided an opportunity to ask questions and all were answered. The patient agreed with the plan and demonstrated an understanding of the instructions.   The patient was advised to call back or seek an in-person evaluation if the symptoms worsen or if the condition fails to improve as anticipated.  I provided 17 minutes of non-face-to-face time during this encounter.   Ashley Hotter, MD       [facility] MD/PA/NP OP Progress Note  03/02/2022 12:07 PM Ashley Franklin  MRN:  [id number]  Chief Complaint:  Chief Complaint  Patient presents with   Follow-up   HPI:  This is a follow-up appointment for bipolar disorder.  This appointment was made sooner due to patient complaining of worsening in anxiety.  She states that she has been feeling anxious for a week.  She denies any triggers.  She reports difficulty in explaining how she experiences.  She feels ready to jump out of body.  She feels almost had self commitment to bring herself to the hospital due to the way she feels.  She has not felt this way over the past several years.  She feels overwhelmed despite she has been trying deep breathing.  She has middle insomnia.  She takes 45 minutes nap.  She has 0 focus.  She states that her work has been smooth, and she has good grades at school.  Although she denies SI, she feels overwhelmed if "the day" does not come as she does  not want to continue to feel this way.  She agrees to contact emergency resources if any worsening.  She has anhedonia.  She denies decreased need for sleep, euphonia or increased goal-directed activity.  She denies alcohol use, drug use.  She takes medication consistently, and denies any change in lately.   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 her daughter obtained PHD in pharmacy, her son bought a house  Visit Diagnosis:    ICD-10-CM   1. Bipolar affective disorder, currently manic, mild (HCC)  F31.11     2. Anxiety state  F41.1       Past Psychiatric History: Please see initial evaluation for full details. I have reviewed the history. No updates at this time.     Past Medical History:  Past Medical History:  Diagnosis Date   Anemia    Anxiety    Bipolar affective disorder (HCC)    Blood transfusion without reported diagnosis    Bronchospasm with bronchitis, acute    Depression    Diabetes (HCC)    diet controlled   Kidney stone    Obesity    Right carpal tunnel syndrome 12/09/2018   SBO (small bowel obstruction) (HCC) 01/2014   Sleep apnea    uses CPAP    Past Surgical History:  Procedure Laterality Date   CARPAL TUNNEL RELEASE  Right 08/05/2019   Procedure: RIGHT CARPAL TUNNEL RELEASE;  Surgeon: Cindee Salt, MD;  Location: [facility];  Service: Orthopedics;  Laterality: Right;  IV REGIONAL FOREARM BLOCK   CESAREAN SECTION  1994   ENDOMETRIAL ABLATION     GASTRIC BYPASS  2001   KIDNEY STONE SURGERY  2010   SBO  2015    Family Psychiatric History: Please see initial evaluation for full details. I have reviewed the history. No updates at this time.     Family History:  Family History  Problem Relation Age of Onset   Diabetes Mother    Cancer Mother 27       Throat    Bipolar disorder Mother     Kidney disease Mother    Heart disease Father    Bipolar disorder Sister    Bipolar disorder Sister    Bipolar disorder Brother    Drug abuse Brother    Colon cancer Maternal Aunt    Kidney disease Maternal Grandmother    Heart disease Paternal Grandfather    Cancer - Prostate Neg Hx    Breast cancer Neg Hx    Esophageal cancer Neg Hx    Rectal cancer Neg Hx    Stomach cancer Neg Hx     Social History:  Social History   Socioeconomic History   Marital status: Married    Spouse name: Not on file   Number of children: 2   Years of education: Not on file   Highest education level: Not on file  Occupational History   Occupation: office administor  Tobacco Use   Smoking status: Former    Packs/day: 0.25    Years: 6.00    Total pack years: 1.50    Types: Cigarettes   Smokeless tobacco: Never   Tobacco comments:    1/2 ppd   Vaping Use   Vaping Use: Never used  Substance and Sexual Activity   Alcohol use: Yes    Alcohol/week: 0.0 standard drinks of alcohol    Comment: Two times a week.    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, music teacher   Right handed    Caffeine 2 cups daily    Social Determinants of Health   Financial Resource Strain: Not on file  Food Insecurity: Not on file  Transportation Needs: Not on file  Physical Activity: Not on file  Stress: Not on file  Social Connections: Not on file    Allergies:  Allergies  Allergen Reactions   Nsaids Other (See Comments)    Contraindicated with gastric bypass    Metabolic Disorder Labs: Lab Results  Component Value Date   HGBA1C 4.9 02/06/2017   MPG 105  02/26/2015   No results found for: "PROLACTIN" Lab Results  Component Value Date   CHOL 165 02/06/2017   TRIG 87 02/06/2017   HDL 54 02/06/2017   LDLCALC 94 02/06/2017   Lab Results  Component Value Date   TSH 1.12 07/10/2017   TSH 1.22 06/08/2016    Therapeutic Level Labs: No results  found for: "LITHIUM" Lab Results  Component Value Date   VALPROATE 62 11/29/2020   VALPROATE 67 12/18/2019   No results found for: "CBMZ"  Current Medications: Current Outpatient Medications  Medication Sig Dispense Refill   LORazepam (ATIVAN) 0.5 MG tablet Take 1 tablet (0.5 mg total) by mouth 2 (two) times daily as needed for anxiety. 30 tablet 0   lurasidone (LATUDA) 20 MG TABS tablet Take 1 tablet (20 mg total) by mouth daily. Total of 100 mg daily. Take along with 80 mg tab 30 tablet 0   amphetamine-dextroamphetamine (ADDERALL XR) 25 MG 24 hr capsule Take 1 capsule by mouth every morning. 30 capsule 0   Armodafinil 250 MG tablet Take 1 tablet (250 mg total) by mouth daily 30 tablet 5   hydrOXYzine (ATARAX) 25 MG tablet Take 1 tablet (25 mg total) by mouth daily as needed for anxiety. 30 tablet 2   [START ON 03/17/2022] lamoTRIgine (LAMICTAL) 200 MG tablet Take 1 tablet (200 mg total) by mouth daily. 30 tablet 2   lubiprostone (AMITIZA) 24 MCG capsule Take 1 capsule (24 mcg total) by mouth 2 (two) times daily. 120 capsule 3   lurasidone (LATUDA) 80 MG TABS tablet Take 1 tablet (80 mg total) by mouth daily with breakfast. 30 tablet 1   omeprazole (PRILOSEC) 20 MG capsule Take 1 capsule (20 mg total) by mouth 2 (two) times daily before a meal. Best to take on an empty stomach. 120 capsule 3   pantoprazole (PROTONIX) 40 MG tablet Take 1 tablet (40 mg total) by mouth 2 (two) times daily. 180 tablet 0   No current facility-administered medications for this visit.     Musculoskeletal: Strength & Muscle Tone:  N/A Gait & Station:  N/A Patient leans: N/A  Psychiatric Specialty Exam: Review of  Systems  Psychiatric/Behavioral:  Positive for decreased concentration and sleep disturbance. Negative for agitation, behavioral problems, confusion, dysphoric mood, hallucinations, self-injury and suicidal ideas. The patient is nervous/anxious. The patient is not hyperactive.   All other systems reviewed and are negative.   There were no vitals taken for this visit.There is no height or weight on file to calculate BMI.  General Appearance: Fairly Groomed  Eye Contact:  Good  Speech:  Clear and Coherent  Volume:  Normal  Mood:  Anxious  Affect:  Appropriate, Congruent, and Tearful  Thought Process:  Coherent  Orientation:  Full (Time, Place, and Person)  Thought Content: Logical   Suicidal Thoughts:  No  Homicidal Thoughts:  No  Memory:  Immediate;   Good  Judgement:  Good  Insight:  Good  Psychomotor Activity:  Normal  Concentration:  Concentration: Good and Attention Span: Good  Recall:  Good  Fund of Knowledge: Good  Language: Good  Akathisia:  No  Handed:  Right  AIMS (if indicated): not done  Assets:  Communication Skills Desire for Improvement  ADL's:  Intact  Cognition: WNL  Sleep:  Poor   Screenings: PHQ2-9    Flowsheet Row Office Visit from 11/08/2021 in [facility] Video Visit from 06/03/2021 in [facility] Video Visit from 02/25/2021 in [facility] Office Visit from 11/29/2020 in [facility] Video Visit from 08/31/2020 in [facility]  PHQ-2 Total Score 4 0 0 1 0  PHQ-9 Total Score 15 -- -- -- --      Flowsheet Row Office Visit from 11/08/2021 in [facility] ED from 08/11/2021 in [facility] Video Visit from 02/25/2021 in [place] No Risk No Risk No Risk        Assessment and Plan:  Ashley Franklin is a 51 y.o.  year old female with a history of bipolar disorder, anxiety, anemia, obesity s/p gastric bypass surgery, who presents for follow up appointment for below.     1. Bipolar affective disorder, currently manic, mild ([place]) 2. Anxiety state There has been significant worsening in anxiety and insomnia over the past week without significant triggers. 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  Will uptitrate Latuda to target bipolar disorder.  Discussed potential metabolic side effect, EPS.  Noted that this medication was chosen due to financial strain.  We will consider restarting Depakote if she has limited benefit from this medication adjustment.  Will start lorazepam as needed for anxiety.  She agrees that this medication will be used only for short-term due to its risk of dependence, sedation and tolerance.  Will continue hydroxyzine as needed for anxiety.    Plan:  Continue lamotrigine 200 mg daily  Increase latuda 100 mg daily  Start lorazepam 0.5 mg twice a day as needed for anxiety  Continue hydroxyzine 25 mg daily as needed for anxiety  Next appointment: 11/15 at 8 AM for 30 mins, video [email or web address] - on Adderall XR 25 mg daily, armodafinil for narcolepsy - was on Depakote (was on 2000 mg), fluoxetine (was on 60 mg daily)      Collaboration of Care: Collaboration of Care: Other reviewed notes in Epic  Patient/Guardian was advised Release of Information must be obtained prior to any record release in order to collaborate their care with an outside provider. Patient/Guardian was advised if they have not already done so to contact the registration department to sign all necessary forms in order for [place] to release information regarding their care.   Consent: Patient/Guardian gives verbal consent for treatment and assignment of benefits for services provided during this visit.  Patient/Guardian expressed understanding and agreed to proceed.    Norman Clay, MD 03/02/2022, 12:07 PM

## 2022-03-15 ENCOUNTER — Telehealth: Payer: Self-pay

## 2022-03-15 ENCOUNTER — Other Ambulatory Visit (HOSPITAL_BASED_OUTPATIENT_CLINIC_OR_DEPARTMENT_OTHER): Payer: Self-pay

## 2022-03-15 MED ORDER — DIVALPROEX SODIUM ER 500 MG PO TB24
500.0000 mg | ORAL_TABLET | Freq: Every day | ORAL | 0 refills | Status: DC
  Filled 2022-03-15: qty 14, 14d supply, fill #0

## 2022-03-15 NOTE — Telephone Encounter (Addendum)
Talked with the patient.  She states that although the lorazepam helped for 1 hour, she started to feel anxious after 2 hours.  She feels like she needs to get up and run.  She feels like she has scar in the brain, which she cannot scratch.  She continues to go to work.  She has not noticed any difference since taking higher dose of Latuda.  She denies decreased need for sleep, increased goal directed behavior.  She sleeps 8 hours.  She denies AH, VH.  She denies SI.  She denies alcohol use or drug use.  She agrees to be back on Depakote, which she used to be in the past with good benefit.  Discussed potential risk of increasing in level of lamotrigine with concomitant use of Depakote. she verbalized understanding, and also agrees to obtain blood test for monitoring in the future.  - Start Depakote 500 mg at night  - Follow up on 11/3 at 8 AM, video

## 2022-03-15 NOTE — Telephone Encounter (Signed)
Patient called to report that she feels she needs to increase the Ativan or restart the Depakote she is having anxiety and it doesn't seem to be getting any better. 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 This has been going on for about 21/2 weeks she has been trying to push through it without calling but she is convinced that the Ativan is not helping her at all. Please advise.

## 2022-03-16 ENCOUNTER — Other Ambulatory Visit: Payer: Self-pay | Admitting: Psychiatry

## 2022-03-16 ENCOUNTER — Telehealth: Payer: Self-pay | Admitting: Psychiatry

## 2022-03-16 DIAGNOSIS — F3111 Bipolar disorder, current episode manic without psychotic features, mild: Secondary | ICD-10-CM

## 2022-03-16 NOTE — Telephone Encounter (Signed)
Spoke with the patient. She was advised to obtain labs (CBC, CMP) today.

## 2022-03-16 NOTE — Progress Notes (Addendum)
Virtual Visit via Video Note  I connected with Ashley Franklin on 03/17/22 at  8:00 AM EDT by a video enabled telemedicine application and verified that I am speaking with the correct person using two identifiers.  Location: Patient: car Provider: office Persons participated in the visit- patient, provider    I discussed the limitations of evaluation and management by telemedicine and the availability of in person appointments. The patient expressed understanding and agreed to proceed.   I discussed the assessment and treatment plan with the patient. The patient was provided an opportunity to ask questions and all were answered. The patient agreed with the plan and demonstrated an understanding of the instructions.   The patient was advised to call back or seek an in-person evaluation if the symptoms worsen or if the condition fails to improve as anticipated.  I provided 45 minutes of non-face-to-face time during this encounter.   Greater than 50% of this time was spent in counseling, explanation of  diagnosis, planning of further management, and coordination of care.    [name], MD    [facility] MD/PA/NP OP Progress Note  03/17/2022 12:28 PM Ashley Franklin  MRN:  [id number]  Chief Complaint:  Chief Complaint  Patient presents with   Follow-up   HPI:  This is a follow-up appointment for bipolar disorder.  This appointment was made urgently due to significant worsening in anxiety.  She states that she continues to feel "itch" in the brain, she cannot scratch. She feels that something is crawling and feels heart beat in the throat, although she denies any other sense of restlessness in her body.  She feels like she is on the edge of sanity.  Although she has been able to do work, it has become more obvious to others, stating that she received some comments from others.  She is not animated and fluently as much as she used to.  She shuts down the door at work.  She cannot sleep in the bed  anymore.  She has overwhelming sense of anxiety, and has not been able to make efforts to sleep.  She stays in the couch, pretending that she is watching TV.  Lorazepam lasts only for an hour.  She has not had any drowsiness since starting Depakote a few days ago.  She denies change in appetite.  She denies SI, HI, hallucinations.  She denies decreased need for sleep or euphonia.  She agrees with the medication change as below.   Anemia-she had gastric bypass surgery years ago.  She has not taken iron supplements for the past few years.  She is currently under evaluation of hiatal hernia by her GI doctor.  She is not aware of any gastric ulcers.  She does not have menstrual period. She feels fatigue, but is unsure if this is due to depression. She denies restless leg.   She had gastric biopsy in May A. DUODENUM, BIOPSY:              Duodenal mucosa with preserved villous and crypt architecture.               No increased intraepithelial lymphocytes identified.               Negative for Celiac disease, dysplasia and carcinoma.    B. ESOPHAGUS, BIOPSY:              Fragments of gastric mucosa with mild chronic inactive gastritis.  Negative for intestinal metaplasia, dysplasia and carcinoma.    C. TRANSVERSE COLON, POLYP, POLYPECTOMY:              Tubular adenoma.              Negative for high grade dysplasia.   Visit Diagnosis:    ICD-10-CM   1. Bipolar affective disorder, currently depressed, mild (HCC)  F31.31 Hepatic function panel    Valproic acid level    2. Anxiety state  F41.1     3. Iron deficiency anemia, unspecified iron deficiency anemia type  D50.9 CANCELED: Iron, TIBC and Ferritin Panel      Past Psychiatric History: Please see initial evaluation for full details. I have reviewed the history. No updates at this time.     Past Medical History:  Past Medical History:  Diagnosis Date   Anemia    Anxiety    Bipolar affective disorder ([place])    Blood  transfusion without reported diagnosis    Bronchospasm with bronchitis, acute    Depression    Diabetes ([place])    diet controlled   Kidney stone    Obesity    Right carpal tunnel syndrome 12/09/2018   SBO (small bowel obstruction) ([place]) 01/2014   Sleep apnea    uses CPAP    Past Surgical History:  Procedure Laterality Date   CARPAL TUNNEL RELEASE Right 08/05/2019   Procedure: RIGHT CARPAL TUNNEL RELEASE;  Surgeon: Daryll Brod, MD;  Location: [place];  Service: Orthopedics;  Laterality: Right;  IV REGIONAL FOREARM BLOCK   CESAREAN SECTION  1994   ENDOMETRIAL ABLATION     GASTRIC BYPASS  2001   KIDNEY STONE SURGERY  2010   SBO  2015    Family Psychiatric History: Please see initial evaluation for full details. I have reviewed the history. No updates at this time.     Family History:  Family History  Problem Relation Age of Onset   Diabetes Mother    Cancer Mother 54       Throat    Bipolar disorder Mother    Kidney disease Mother    Heart disease Father    Bipolar disorder Sister    Bipolar disorder Sister    Bipolar disorder Brother    Drug abuse Brother    Colon cancer Maternal Aunt    Kidney disease Maternal Grandmother    Heart disease Paternal Grandfather    Cancer - Prostate Neg Hx    Breast cancer Neg Hx    Esophageal cancer Neg Hx    Rectal cancer Neg Hx    Stomach cancer Neg Hx     Social History:  Social History   Socioeconomic History   Marital status: Married    Spouse name: Not on file   Number of children: 2   Years of education: Not on file   Highest education level: Not on file  Occupational History   Occupation: office administor  Tobacco Use   Smoking status: Former    Packs/day: 0.25    Years: 6.00    Total pack years: 1.50    Types: Cigarettes   Smokeless tobacco: Never   Tobacco comments:    1/2 ppd   Vaping Use   Vaping Use: Never used  Substance and Sexual Activity   Alcohol use: Yes    Alcohol/week: 0.0  standard drinks of alcohol    Comment: Two times a week.    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, music teacher   Right handed    Caffeine 2 cups daily    Social Determinants of Health   Financial Resource Strain: Not on file  Food Insecurity: Not on file  Transportation Needs: Not on file  Physical Activity: Not on file  Stress: Not on file  Social Connections: Not on file    Allergies:  Allergies  Allergen Reactions   Nsaids Other (See Comments)    Contraindicated with gastric bypass    Metabolic Disorder Labs: Lab Results  Component Value Date   HGBA1C 4.9 02/06/2017   MPG 105 02/26/2015   No results found for: "PROLACTIN" Lab Results  Component Value Date   CHOL 165 02/06/2017   TRIG 87 02/06/2017   HDL 54 02/06/2017   LDLCALC 94 02/06/2017   Lab Results  Component Value Date   TSH 1.12 07/10/2017   TSH 1.22 06/08/2016    Therapeutic Level Labs: No results found for: "LITHIUM" Lab Results  Component Value Date   VALPROATE 62 11/29/2020   VALPROATE 67 12/18/2019   No results found for: "CBMZ"  Current Medications: Current Outpatient Medications  Medication Sig Dispense Refill   clonazePAM (KLONOPIN) 0.5 MG tablet Take 1 tablet (0.5 mg total) by mouth 2 (two) times daily as needed for anxiety. 60 tablet 0   divalproex (DEPAKOTE ER) 250 MG 24 hr tablet Take 1 tablet (250 mg total) by mouth daily. Take total of 750 mg. Take along with 500 mg tab 30 tablet 0   amphetamine-dextroamphetamine (ADDERALL XR) 25 MG 24 hr capsule Take 1 capsule by mouth every morning. 30 capsule 0   Armodafinil 250 MG tablet Take 1  tablet (250 mg total) by mouth daily 30 tablet 5   divalproex (DEPAKOTE ER) 500 MG 24 hr tablet Take 1 tablet (500 mg total) by mouth daily for 14 days. 14 tablet 0   hydrOXYzine (ATARAX) 25 MG tablet Take 1 tablet (25 mg total) by mouth daily as needed for anxiety. 30 tablet 2   lamoTRIgine (LAMICTAL) 200 MG tablet Take 1 tablet (200 mg total) by mouth daily. 30 tablet 2   lubiprostone (AMITIZA) 24 MCG capsule Take 1 capsule (24 mcg total) by mouth 2 (two) times daily. 120 capsule 3   lurasidone (LATUDA) 20 MG TABS tablet Take 1 tablet (20 mg total) by mouth daily. Total of 100 mg daily. Take along with 80 mg tab 30 tablet 0   lurasidone (LATUDA) 80 MG TABS tablet Take 1 tablet (80 mg total) by mouth daily with breakfast. 30 tablet 1   omeprazole (PRILOSEC) 20 MG capsule Take 1 capsule (20 mg total) by mouth 2 (two) times daily before a meal. Best to take on an empty stomach. 120 capsule 3   pantoprazole (PROTONIX) 40 MG tablet Take 1 tablet (40 mg total) by mouth 2 (two) times daily. 180 tablet 0   No current facility-administered medications for this visit.     Musculoskeletal: Strength & Muscle Tone:  N/A Gait & Station:  N/A Patient leans: N/A  Psychiatric Specialty Exam: Review  of Systems  Psychiatric/Behavioral:  Positive for decreased concentration, dysphoric mood and sleep disturbance. Negative for agitation, behavioral problems, confusion, hallucinations, self-injury and suicidal ideas. The patient is nervous/anxious. The patient is not hyperactive.   All other systems reviewed and are negative.   There were no vitals taken for this visit.There is no height or weight on file to calculate BMI.  General Appearance: Fairly Groomed  Eye Contact:  Good  Speech:  Clear and Coherent  Volume:  Normal  Mood:  Depressed  Affect:  Appropriate, Congruent, Tearful, and tense  Thought Process:  Coherent  Orientation:  Full (Time, Place, and Person)  Thought Content: Logical   Suicidal  Thoughts:  No  Homicidal Thoughts:  No  Memory:  Immediate;   Good  Judgement:  Good  Insight:  Good  Psychomotor Activity:  Normal  Concentration:  Concentration: Good and Attention Span: Good  Recall:  Good  Fund of Knowledge: Good  Language: Good  Akathisia:  No  Handed:  Right  AIMS (if indicated): not done  Assets:  Communication Skills Desire for Improvement  ADL's:  Intact  Cognition: WNL  Sleep:  Fair   Screenings: PHQ2-9    Flowsheet Row Office Visit from 11/08/2021 in [facility] Video Visit from 06/03/2021 in [facility] Video Visit from 02/25/2021 in [facility] Office Visit from 11/29/2020 in [facility] Video Visit from 08/31/2020 in [facility]  PHQ-2 Total Score 4 0 0 1 0  PHQ-9 Total Score 15 -- -- -- --      Flowsheet Row Office Visit from 11/08/2021 in [facility] ED from 08/11/2021 in [facility] Video Visit from 02/25/2021 in [facility]  C-SSRS RISK CATEGORY No Risk No Risk No Risk        Assessment and Plan:  Ashley Franklin is a 51 y.o. year old female with a history of bipolar disorder, anxiety, anemia, obesity s/p gastric bypass surgery, who presents for follow up appointment for below.    1. Bipolar affective disorder, currently depressed, mild (HCC) 2. Anxiety state # akathisia She reports significant worsening in anxiety since the last visit. 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  Etiology of her anxiety including akathisia, hypomanic symptoms.  Will reduce dose of Latuda to mitigate this potential adverse reaction.  Will uptitrate Depakote to optimize the treatment for mania.  Discussed potential side effect of drowsiness,  liver function abnormality, increased risk of Stevens-Johnson syndrome with concomitant use of lamotrigine.  Will switch from lorazepam to clonazepam to see if this will be more beneficial for her current symptoms.  She varas understanding that this medication will be used only for short-term to avoid any risks.  Will continue hydroxyzine as needed for anxiety.  Of note, she has been on Adderall XR for narcolepsy.  She was advised that this medication may need to be adjusted in the future due to her current symptoms.   # Iron deficiency anemia Labs reviewed. She has microcytic anemia, which was not evident in the last years blood test. She has history of gastric bypass surgery, but has not been any iron supplements for the past few years. According to the chart review, she did upper GI/lower GI with no obvious findings to explain this anemia.She does not have menstrual cycle. She was advised to contact  her PCP for father evaluation.     Plan:  Continue lamotrigine 200 mg daily  Decrease Latuda 80 mg daily  Increase Depakote 750 mg at night  Check labs - VPA, LFT Hold lorazepam Start Clonazepam 0.5 mg twice a day as needed for anxiety  Continue hydroxyzine 25 mg daily as needed for anxiety  Next appointment: 11/15 at 8 AM for 30 mins, video [email or web address] - on Adderall XR 25 mg daily, armodafinil for narcolepsy - was on Depakote (was on 2000 mg), fluoxetine (was on 60 mg daily)   Collaboration of Care: Collaboration of Care: Other reviewed in Epic  Patient/Guardian was advised Release of Information must be obtained prior to any record release in order to collaborate their care with an outside provider. Patient/Guardian was advised if they have not already done so to contact the registration department to sign all necessary forms in order for us to release information regarding their care.   Consent: Patient/Guardian gives verbal consent for treatment and assignment of  benefits for services provided during this visit. Patient/Guardian expressed understanding and agreed to proceed.    Neysa Hottereina Layce Sprung, MD 03/17/2022, 12:28 PM

## 2022-03-17 ENCOUNTER — Encounter: Payer: Self-pay | Admitting: Psychiatry

## 2022-03-17 ENCOUNTER — Other Ambulatory Visit (HOSPITAL_BASED_OUTPATIENT_CLINIC_OR_DEPARTMENT_OTHER): Payer: Self-pay

## 2022-03-17 ENCOUNTER — Telehealth (INDEPENDENT_AMBULATORY_CARE_PROVIDER_SITE_OTHER): Payer: 59 | Admitting: Psychiatry

## 2022-03-17 DIAGNOSIS — F3131 Bipolar disorder, current episode depressed, mild: Secondary | ICD-10-CM

## 2022-03-17 DIAGNOSIS — D509 Iron deficiency anemia, unspecified: Secondary | ICD-10-CM

## 2022-03-17 DIAGNOSIS — F411 Generalized anxiety disorder: Secondary | ICD-10-CM | POA: Diagnosis not present

## 2022-03-17 LAB — COMPREHENSIVE METABOLIC PANEL
ALT: 7 IU/L (ref 0–32)
AST: 18 IU/L (ref 0–40)
Albumin/Globulin Ratio: 1.4 (ref 1.2–2.2)
Albumin: 4.3 g/dL (ref 3.9–4.9)
Alkaline Phosphatase: 114 IU/L (ref 44–121)
BUN/Creatinine Ratio: 15 (ref 9–23)
BUN: 11 mg/dL (ref 6–24)
Bilirubin Total: <0.2 mg/dL (ref 0.0–1.2)
CO2: 21 mmol/L (ref 20–29)
Calcium: 9.5 mg/dL (ref 8.7–10.2)
Chloride: 103 mmol/L (ref 96–106)
Creatinine, Ser: 0.75 mg/dL (ref 0.57–1.00)
Globulin, Total: 3 g/dL (ref 1.5–4.5)
Glucose: 80 mg/dL (ref 70–99)
Potassium: 4.8 mmol/L (ref 3.5–5.2)
Sodium: 142 mmol/L (ref 134–144)
Total Protein: 7.3 g/dL (ref 6.0–8.5)
eGFR: 98 mL/min/{1.73_m2} (ref >59)

## 2022-03-17 LAB — CBC
Hematocrit: 35.9 % (ref 34.0–46.6)
Hemoglobin: 10.7 g/dL — ABNORMAL LOW (ref 11.1–15.9)
MCH: 21.8 pg — ABNORMAL LOW (ref 26.6–33.0)
MCHC: 29.8 g/dL — ABNORMAL LOW (ref 31.5–35.7)
MCV: 73 fL — ABNORMAL LOW (ref 79–97)
Platelets: 379 10*3/uL (ref 150–450)
RBC: 4.91 x10E6/uL (ref 3.77–5.28)
RDW: 15.4 % (ref 11.7–15.4)
WBC: 9.6 10*3/uL (ref 3.4–10.8)

## 2022-03-17 MED ORDER — DIVALPROEX SODIUM ER 250 MG PO TB24
250.0000 mg | ORAL_TABLET | Freq: Every day | ORAL | 0 refills | Status: DC
  Filled 2022-03-17: qty 30, 30d supply, fill #0

## 2022-03-17 MED ORDER — CLONAZEPAM 0.5 MG PO TABS
0.5000 mg | ORAL_TABLET | Freq: Two times a day (BID) | ORAL | 0 refills | Status: DC | PRN
  Filled 2022-03-17: qty 60, 30d supply, fill #0

## 2022-03-17 NOTE — Patient Instructions (Signed)
Continue lamotrigine 200 mg daily  Decrease Latuda 80 mg daily  Increase Depakote 750 mg at night  Check labs - VPA, LFT Hold lorazepam Start Clonazepam 0.5 mg twice a day as needed for anxiety  Continue hydroxyzine 25 mg daily as needed for anxiety  Next appointment: 11/15 at 8 AM

## 2022-03-23 NOTE — Progress Notes (Signed)
Virtual Visit via Video Note  I connected with Ashley Franklin on 03/29/22 at  8:00 AM EST by a video enabled telemedicine application and verified that I am speaking with the correct person using two identifiers.  Location: Patient: home Provider: office Persons participated in the visit- patient, provider    I discussed the limitations of evaluation and management by telemedicine and the availability of in person appointments. The patient expressed understanding and agreed to proceed.    I discussed the assessment and treatment plan with the patient. The patient was provided an opportunity to ask questions and all were answered. The patient agreed with the plan and demonstrated an understanding of the instructions.   The patient was advised to call back or seek an in-person evaluation if the symptoms worsen or if the condition fails to improve as anticipated.  I provided 26 minutes of non-face-to-face time during this encounter.   Ashley Hotter, MD    [facility] MD/PA/NP OP Progress Note  03/29/2022 8:38 AM Ashley Franklin  MRN:  [id number]  Chief Complaint:  Chief Complaint  Patient presents with   Follow-up   HPI:  This is a follow-up appointment for bipolar disorder and anxiety.  She states that she has been definitely doing better since the last visit.  Although she still has controllable crying, it has been better, and she does not feel beating in her chest as much compared to before.  She has been able to sleep on the bed, although she does not get in to the bed.  She feels sad going there.  She had similar episode around 10 years ago, 1 week prior to her mother bringing her into the hospital.  She states that her mother also suffered from bipolar disorder and others.  Her father has been very understanding, and has been supportive so that she can get her medication.  She has been communicating with her husband, who she thinks understands her the best.  Although he tended to  criticize her when she was in a good condition in the past, he has been very supportive so for.  She is looking for to seeing her son, who will visit her this weekend.  She has 164 day to graduate, and stays positive.  She likes her work, although it has been very busy.  She feels down at times. She still has racing thoughts (feels that she has 15 things to be done.) She denies SI, HI, AH, VH. She denies decreased need for sleep or euphonia.  She denies alcohol use or drug use.     Visit Diagnosis:    ICD-10-CM   1. Bipolar affective disorder, currently depressed, mild (HCC)  F31.31     2. Anxiety state  F41.1     3. Akathisia  G25.71       Past Psychiatric History: Please see initial evaluation for full details. I have reviewed the history. No updates at this time.     Past Medical History:  Past Medical History:  Diagnosis Date   Anemia    Anxiety    Bipolar affective disorder (HCC)    Blood transfusion without reported diagnosis    Bronchospasm with bronchitis, acute    Depression    Diabetes (HCC)    diet controlled   Kidney stone    Obesity    Right carpal tunnel syndrome 12/09/2018   SBO (small bowel obstruction) (HCC) 01/2014   Sleep apnea    uses CPAP    Past Surgical  History:  Procedure Laterality Date   CARPAL TUNNEL RELEASE Right 08/05/2019   Procedure: RIGHT CARPAL TUNNEL RELEASE;  Surgeon: Cindee Salt, MD;  Location: [facility];  Service: Orthopedics;  Laterality: Right;  IV REGIONAL FOREARM BLOCK   CESAREAN SECTION  1994   ENDOMETRIAL ABLATION     GASTRIC BYPASS  2001   KIDNEY STONE SURGERY  2010   SBO  2015    Family Psychiatric History: Please see initial evaluation for full details. I have reviewed the history. No updates at this time.     Family History:  Family History  Problem Relation Age of Onset   Diabetes Mother    Cancer Mother 82       Throat    Bipolar disorder Mother    Kidney disease Mother    Heart disease Father     Bipolar disorder Sister    Bipolar disorder Sister    Bipolar disorder Brother    Drug abuse Brother    Colon cancer Maternal Aunt    Kidney disease Maternal Grandmother    Heart disease Paternal Grandfather    Cancer - Prostate Neg Hx    Breast cancer Neg Hx    Esophageal cancer Neg Hx    Rectal cancer Neg Hx    Stomach cancer Neg Hx     Social History:  Social History   Socioeconomic History   Marital status: Married    Spouse name: Not on file   Number of children: 2   Years of education: Not on file   Highest education level: Not on file  Occupational History   Occupation: office administor  Tobacco Use   Smoking status: Former    Packs/day: 0.25    Years: 6.00    Total pack years: 1.50    Types: Cigarettes   Smokeless tobacco: Never   Tobacco comments:    1/2 ppd   Vaping Use   Vaping Use: Never used  Substance and Sexual Activity   Alcohol use: Yes    Alcohol/week: 0.0 standard drinks of alcohol    Comment: Two times a week.    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, music teacher   Right handed    Caffeine 2 cups daily    Social Determinants of Health   Financial Resource Strain: Not on file  Food Insecurity: Not on file  Transportation Needs: Not on file  Physical Activity: Not on file  Stress: Not on file  Social Connections: Not on file    Allergies:  Allergies  Allergen Reactions   Nsaids Other (See Comments)    Contraindicated with gastric bypass    Metabolic Disorder Labs: Lab Results  Component Value Date   HGBA1C 4.9 02/06/2017   MPG 105 02/26/2015   No results found for: "PROLACTIN" Lab  Results  Component Value Date   CHOL 165 02/06/2017   TRIG 87 02/06/2017   HDL 54 02/06/2017   LDLCALC 94 02/06/2017   Lab Results  Component Value Date   TSH 1.12 07/10/2017   TSH  1.22 06/08/2016    Therapeutic Level Labs: No results found for: "LITHIUM" Lab Results  Component Value Date   VALPROATE 67 03/27/2022   VALPROATE 62 11/29/2020   No results found for: "CBMZ"  Current Medications: Current Outpatient Medications  Medication Sig Dispense Refill   amphetamine-dextroamphetamine (ADDERALL XR) 25 MG 24 hr capsule Take 1 capsule by mouth every morning. 30 capsule 0   Armodafinil 250 MG tablet Take 1 tablet (250 mg total) by mouth daily 30 tablet 5   clonazePAM (KLONOPIN) 0.5 MG tablet Take 1 tablet (0.5 mg total) by mouth 2 (two) times daily as needed for anxiety. 60 tablet 0   divalproex (DEPAKOTE ER) 250 MG 24 hr tablet Take 1 tablet (250 mg total) by mouth daily. Take total of 750 mg. Take along with 500 mg tab 30 tablet 0   [START ON 04/01/2022] divalproex (DEPAKOTE ER) 500 MG 24 hr tablet Take 1 tablet (500 mg total) by mouth daily. Take total of 750 mg daily. Take along with 250 mg tab 30 tablet 0   hydrOXYzine (ATARAX) 25 MG tablet Take 1 tablet (25 mg total) by mouth daily as needed for anxiety. 30 tablet 2   lamoTRIgine (LAMICTAL) 200 MG tablet Take 1 tablet (200 mg total) by mouth daily. 30 tablet 2   lubiprostone (AMITIZA) 24 MCG capsule Take 1 capsule (24 mcg total) by mouth 2 (two) times daily. 120 capsule 3   lurasidone (LATUDA) 80 MG TABS tablet Take 1 tablet (80 mg total) by mouth daily with breakfast. 30 tablet 1   omeprazole (PRILOSEC) 20 MG capsule Take 1 capsule (20 mg total) by mouth 2 (two) times daily before a meal. Best to take on an empty stomach. 120 capsule 3   pantoprazole (PROTONIX) 40 MG tablet Take 1 tablet (40 mg total) by mouth 2 (two) times daily. 180 tablet 0   No current facility-administered medications for this visit.      Musculoskeletal: Strength & Muscle Tone:  N/A Gait & Station:  N/A Patient leans: N/A  Psychiatric Specialty Exam: Review of Systems  Psychiatric/Behavioral:  Positive for dysphoric mood and sleep disturbance. Negative for agitation, behavioral problems, confusion, decreased concentration, hallucinations, self-injury and suicidal ideas. The patient is nervous/anxious. The patient is not hyperactive.   All other systems reviewed and are negative.   There were no vitals taken for this visit.There is no height or weight on file to calculate BMI.  General Appearance: Fairly Groomed  Eye Contact:  Good  Speech:  Normal Rate  Volume:  Normal  Mood:   better  Affect:  Appropriate, Congruent, Tearful, and brighter  Thought Process:  Coherent  Orientation:  Full (Time, Place, and Person)  Thought Content: Logical   Suicidal Thoughts:  No  Homicidal Thoughts:  No  Memory:  Immediate;   Good  Judgement:  Good  Insight:  Good  Psychomotor Activity:  Normal  Concentration:  Concentration: Good and Attention Span: Good  Recall:  Good  Fund of Knowledge: Good  Language: Good  Akathisia:  No  Handed:  Right  AIMS (if indicated): not done  Assets:  Communication Skills Desire for Improvement  ADL's:  Intact  Cognition: WNL  Sleep:  Fair   Screenings: Equities trader Office Visit from 11/08/2021 in [facility] Video Visit from 06/03/2021 in [facility] Video Visit from 02/25/2021 in [facility] Office Visit from 11/29/2020 in [facility] Video Visit from  08/31/2020 in [facility]  PHQ-2 Total Score 4 0 0 1 0  PHQ-9 Total Score 15 -- -- -- --      Flowsheet Row Office Visit from 11/08/2021 in [facility] ED from 08/11/2021 in [facility] Video Visit from 02/25/2021 in [facility]  C-SSRS RISK CATEGORY No Risk No Risk No Risk        Assessment and Plan:  Ashley Franklin is a 51 y.o. year old female with a history of  bipolar disorder, anxiety, anemia, obesity s/p gastric bypass surgery, who presents for follow up appointment for below.   1. Bipolar affective disorder, currently depressed, mild (HCC) 2. Anxiety state 3. Akathisia There has been overall improvement in the anxiety and depressive symptoms since the adjustment of her medication.  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  It is still difficult to discern whether her symptoms are more attributable to akathisia and/or hypomanic symptoms.  Given that there has been improvement, she agrees to stay at the current medication regimen given it has been adjusted only a few weeks ago.  Will continue current dose of Latuda, lamotrigine, Depakote to target bipolar disorder.  She was reminded again regarding its potential risk of Stevens-Johnson syndrome especially with concomitant use of Depakote.  Will continue clonazepam as needed for anxiety.  This medication will be used only for short-term given her significant anxiety.  Will continue hydroxyzine as needed for anxiety.    # Iron deficiency anemia Labs reviewed at the previous visit. She has microcytic anemia, which was not evident in the last years blood test. She has history of gastric bypass surgery, but has not been any iron supplements for the past few years. According to the chart review, she did upper GI/lower GI with no obvious findings to explain this anemia.She does not have menstrual cycle. She was advised to contact her PCP for father evaluation.      Plan:  Continue lamotrigine 200 mg daily  Continue Latuda 80 mg daily  Continue Depakote 750 mg at night  Continue Clonazepam 0.5 mg twice a day as needed for anxiety  Continue  hydroxyzine 25 mg daily as needed for anxiety  Next appointment: 11/27 at 10 AM for 30 mins, video [email or web address] - on Adderall XR 25 mg daily, armodafinil for narcolepsy - was on Depakote (was on 2000 mg), fluoxetine (was on 60 mg daily)          Collaboration of Care: Collaboration of Care: Other reviewed notes in Epic  Patient/Guardian was advised Release of Information must be obtained prior to any record release in order to collaborate their care with an outside provider. Patient/Guardian was advised if they have not already done so to contact the registration department to sign all necessary forms in order for [place] to release information regarding their care.   Consent: Patient/Guardian gives verbal consent for treatment and assignment of benefits for services provided during this visit. Patient/Guardian expressed understanding and agreed to proceed.    Ashley Hotter, MD 03/29/2022, 8:38 AM

## 2022-03-28 LAB — HEPATIC FUNCTION PANEL
ALT: 10 IU/L (ref 0–32)
AST: 17 IU/L (ref 0–40)
Albumin: 4.2 g/dL (ref 3.9–4.9)
Alkaline Phosphatase: 103 IU/L (ref 44–121)
Bilirubin Total: <0.2 mg/dL (ref 0.0–1.2)
Bilirubin, Direct: <0.1 mg/dL (ref 0.00–0.40)
Total Protein: 6.9 g/dL (ref 6.0–8.5)

## 2022-03-28 LAB — VALPROIC ACID LEVEL: Valproic Acid Lvl: 67 ug/mL (ref 50–100)

## 2022-03-29 ENCOUNTER — Encounter: Payer: Self-pay | Admitting: Internal Medicine

## 2022-03-29 ENCOUNTER — Telehealth (INDEPENDENT_AMBULATORY_CARE_PROVIDER_SITE_OTHER): Payer: 59 | Admitting: Psychiatry

## 2022-03-29 ENCOUNTER — Encounter: Payer: Self-pay | Admitting: Psychiatry

## 2022-03-29 ENCOUNTER — Other Ambulatory Visit (HOSPITAL_BASED_OUTPATIENT_CLINIC_OR_DEPARTMENT_OTHER): Payer: Self-pay

## 2022-03-29 DIAGNOSIS — F3131 Bipolar disorder, current episode depressed, mild: Secondary | ICD-10-CM | POA: Diagnosis not present

## 2022-03-29 DIAGNOSIS — D509 Iron deficiency anemia, unspecified: Secondary | ICD-10-CM | POA: Diagnosis not present

## 2022-03-29 DIAGNOSIS — G2571 Drug induced akathisia: Secondary | ICD-10-CM | POA: Diagnosis not present

## 2022-03-29 DIAGNOSIS — F411 Generalized anxiety disorder: Secondary | ICD-10-CM

## 2022-03-29 MED ORDER — DIVALPROEX SODIUM ER 500 MG PO TB24
500.0000 mg | ORAL_TABLET | Freq: Every day | ORAL | 0 refills | Status: DC
  Filled 2022-03-29: qty 30, 30d supply, fill #0

## 2022-03-29 NOTE — Patient Instructions (Signed)
Continue lamotrigine 200 mg daily  Continue Latuda 80 mg daily  Continue Depakote 750 mg at night  Continue Clonazepam 0.5 mg twice a day as needed for anxiety  Continue hydroxyzine 25 mg daily as needed for anxiety  Next appointment: 11/27 at 10 AM

## 2022-04-05 ENCOUNTER — Other Ambulatory Visit (HOSPITAL_BASED_OUTPATIENT_CLINIC_OR_DEPARTMENT_OTHER): Payer: Self-pay

## 2022-04-05 MED ORDER — ARMODAFINIL 250 MG PO TABS
250.0000 mg | ORAL_TABLET | Freq: Every day | ORAL | 5 refills | Status: AC
  Filled 2022-04-05 – 2022-06-05 (×2): qty 30, 30d supply, fill #0
  Filled 2022-07-07: qty 30, 30d supply, fill #1
  Filled 2022-07-23 – 2022-08-16 (×2): qty 30, 30d supply, fill #2

## 2022-04-05 MED ORDER — AMPHETAMINE-DEXTROAMPHET ER 25 MG PO CP24
25.0000 mg | ORAL_CAPSULE | Freq: Every morning | ORAL | 0 refills | Status: DC
  Filled 2022-04-05: qty 30, 30d supply, fill #0

## 2022-04-05 NOTE — Progress Notes (Signed)
Virtual Visit via Video Note  I connected with Ashley Franklin on 04/10/22 at 10:00 AM EST by a video enabled telemedicine application and verified that I am speaking with the correct person using two identifiers.  Location: Patient: work Provider: office Persons participated in the visit- patient, provider    I discussed the limitations of evaluation and management by telemedicine and the availability of in person appointments. The patient expressed understanding and agreed to proceed.     I discussed the assessment and treatment plan with the patient. The patient was provided an opportunity to ask questions and all were answered. The patient agreed with the plan and demonstrated an understanding of the instructions.   The patient was advised to call back or seek an in-person evaluation if the symptoms worsen or if the condition fails to improve as anticipated.  I provided 20 minutes of non-face-to-face time during this encounter.   [name], MD    [facility] MD/PA/NP OP Progress Note  04/10/2022 10:32 AM Ashley Franklin  MRN:  [id number]  Chief Complaint:  Chief Complaint  Patient presents with   Follow-up   Other   HPI:  This is a follow-up appointment for bipolar disorder.  She states that although there has been little improvement, not remarkably better.  She thinks she is able to clear her head more quickly when she has a thought of passive SI.  She tends to joke that why she was not born as a Medical laboratory scientific officer.  She feels envious of others in wheelchair. She denies any SI plan or intent, and has agreed to contact emergency resources if any worsening.  Although she comes to work, she feels overwhelmed and want to crawl up.  She tends to delay things to the last minute.  She felt her thoughts were all over the place on weekend, being alone.  She has not talked about her mental health with her parents so that they will not be stressed about her.  She has another week of school.  She missed a  few assignments, although she does not know why.  She sleeps 6 hours, although she used to sleep 8 to 9 hours.  She continues to have racing thoughts a few times per week.  She denies euphonia.  She denies change in appetite.  She denies hallucinations.  She denies alcohol use or drug use.  She takes clonazepam none to a few times per day for anxiety. She is willing to try higher dose of Depakote at this time.   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 her daughter obtained PHD in pharmacy, her son bought a house  Visit Diagnosis:    ICD-10-CM   1. Bipolar affective disorder, currently depressed, mild (HCC)  F31.31 Hepatic function panel    Valproic acid level    2. Anxiety state  F41.1     3. Akathisia  G25.71       Past Psychiatric History: Please see initial evaluation for full details. I have reviewed the history. No updates at this time.     Past Medical History:  Past Medical History:  Diagnosis Date   Anemia    Anxiety    Bipolar affective disorder (HCC)    Blood transfusion without reported diagnosis    Bronchospasm with bronchitis, acute    Depression    Diabetes (HCC)    diet controlled  Kidney stone    Obesity    Right carpal tunnel syndrome 12/09/2018   SBO (small bowel obstruction) (HCC) 01/2014   Sleep apnea    uses CPAP    Past Surgical History:  Procedure Laterality Date   CARPAL TUNNEL RELEASE Right 08/05/2019   Procedure: RIGHT CARPAL TUNNEL RELEASE;  Surgeon: Cindee SaltKuzma, Gary, MD;  Location: [facility];  Service: Orthopedics;  Laterality: Right;  IV REGIONAL FOREARM BLOCK   CESAREAN SECTION  1994   ENDOMETRIAL ABLATION     GASTRIC BYPASS  2001   KIDNEY STONE SURGERY  2010   SBO  2015    Family Psychiatric History: Please see initial evaluation for full details. I have reviewed the history.  No updates at this time.     Family History:  Family History  Problem Relation Age of Onset   Diabetes Mother    Cancer Mother [id number]       Throat    Bipolar disorder Mother    Kidney disease Mother    Heart disease Father    Bipolar disorder Sister    Bipolar disorder Sister    Bipolar disorder Brother    Drug abuse Brother    Colon cancer Maternal Aunt    Kidney disease Maternal Grandmother    Heart disease Paternal Grandfather    Cancer - Prostate Neg Hx    Breast cancer Neg Hx    Esophageal cancer Neg Hx    Rectal cancer Neg Hx    Stomach cancer Neg Hx     Social History:  Social History   Socioeconomic History   Marital status: Married    Spouse name: Not on file   Number of children: 2   Years of education: Not on file   Highest education level: Not on file  Occupational History   Occupation: office administor  Tobacco Use   Smoking status: Former    Packs/day: 0.25    Years: 6.00    Total pack years: 1.50    Types: Cigarettes   Smokeless tobacco: Never   Tobacco comments:    1/2 ppd   Vaping Use   Vaping Use: Never used  Substance and Sexual Activity   Alcohol use: Yes    Alcohol/week: 0.0 standard drinks of alcohol    Comment: Two times a week.    Drug use: No   Sexual activity: Yes    Partners: Male    Birth control/protection: Surgical  Other Topics Concern   Not on file  Social History Narrative      1 son born 1988-08-13 daughter born in [id number] she is married and lives with her husband   Prior smoker not current 3 caffeinated beverages a day no alcohol tobacco or drug use      2 independent Acupuncturistteachers    Pharmacy schools, music teacher   Right handed    Caffeine 2 cups daily    Social Determinants of Health   Financial Resource Strain: Not on file  Food Insecurity: Not on file  Transportation Needs: Not on file  Physical Activity: Not on file  Stress: Not on file  Social Connections: Not on file    Allergies:  Allergies  Allergen  Reactions   Nsaids Other (See Comments)    Contraindicated with gastric bypass    Metabolic Disorder Labs: Lab Results  Component Value Date   HGBA1C 4.9 02/06/2017   MPG 105 02/26/2015   No results found for: "PROLACTIN" Lab Results  Component Value Date  CHOL 165 02/06/2017   TRIG 87 02/06/2017   HDL 54 02/06/2017   LDLCALC 94 02/06/2017   Lab Results  Component Value Date   TSH 1.12 07/10/2017   TSH 1.22 06/08/2016    Therapeutic Level Labs: No results found for: "LITHIUM" Lab Results  Component Value Date   VALPROATE 67 03/27/2022   VALPROATE 62 11/29/2020   No results found for: "CBMZ"  Current Medications: Current Outpatient Medications  Medication Sig Dispense Refill   amphetamine-dextroamphetamine (ADDERALL XR) 25 MG 24 hr capsule Take 1 capsule by mouth every morning. 30 capsule 0   Armodafinil 250 MG tablet Take 1 tablet (250 mg total) by mouth daily 30 tablet 5   Armodafinil 250 MG tablet Take 1 tablet (250 mg total) by mouth daily. 30 tablet 5   [START ON 04/17/2022] clonazePAM (KLONOPIN) 0.5 MG tablet Take 1 tablet (0.5 mg total) by mouth 2 (two) times daily as needed for anxiety. 60 tablet 0   divalproex (DEPAKOTE ER) 250 MG 24 hr tablet Take 1 tablet (250 mg total) by mouth daily. Take total of 750 mg. Take along with 500 mg tab 30 tablet 0   divalproex (DEPAKOTE ER) 500 MG 24 hr tablet Take 2 tablets (1,000 mg total) by mouth at bedtime. 60 tablet 0   hydrOXYzine (ATARAX) 25 MG tablet Take 1 tablet (25 mg total) by mouth daily as needed for anxiety. 30 tablet 2   lamoTRIgine (LAMICTAL) 200 MG tablet Take 1 tablet (200 mg total) by mouth daily. 30 tablet 2   lubiprostone (AMITIZA) 24 MCG capsule Take 1 capsule (24 mcg total) by mouth 2 (two) times daily. 120 capsule 3   lurasidone (LATUDA) 80 MG TABS tablet Take 1 tablet (80 mg total) by mouth daily with breakfast. 30 tablet 1   omeprazole (PRILOSEC) 20 MG capsule Take 1 capsule (20 mg total) by mouth 2  (two) times daily before a meal. Best to take on an empty stomach. 120 capsule 3   pantoprazole (PROTONIX) 40 MG tablet Take 1 tablet (40 mg total) by mouth 2 (two) times daily. 180 tablet 0   No current facility-administered medications for this visit.     Musculoskeletal: Strength & Muscle Tone:  N/A Gait & Station:  N/A Patient leans: N/A  Psychiatric Specialty Exam: Review of Systems  Psychiatric/Behavioral:  Positive for decreased concentration, dysphoric mood, sleep disturbance and suicidal ideas. Negative for agitation, behavioral problems, confusion, hallucinations and self-injury. The patient is nervous/anxious. The patient is not hyperactive.   All other systems reviewed and are negative.   There were no vitals taken for this visit.There is no height or weight on file to calculate BMI.  General Appearance: Fairly Groomed  Eye Contact:  Good  Speech:  Clear and Coherent  Volume:  Normal  Mood:  Depressed  Affect:  Appropriate, Congruent, and Tearful  Thought Process:  Coherent  Orientation:  Full (Time, Place, and Person)  Thought Content: Logical   Suicidal Thoughts:  Yes.  without intent/plan  Homicidal Thoughts:  No  Memory:  Immediate;   Good  Judgement:  Good  Insight:  Good  Psychomotor Activity:  Normal  Concentration:  Concentration: Good and Attention Span: Good  Recall:  Good  Fund of Knowledge: Good  Language: Good  Akathisia:  No  Handed:  Right  AIMS (if indicated): not done  Assets:  Communication Skills Desire for Improvement  ADL's:  Intact  Cognition: WNL  Sleep:  Poor   Screenings: PHQ2-9  Flowsheet Row Office Visit from 11/08/2021 in [facility] Video Visit from 06/03/2021 in [facility] Video Visit from 02/25/2021 in [facility] Office Visit from 11/29/2020 in [facility] Video Visit from 08/31/2020 in [facility]  PHQ-2 Total Score 4 0 0 1 0  PHQ-9 Total Score 15 -- -- -- --      Flowsheet Row Office Visit from 11/08/2021 in [facility] ED from 08/11/2021 in [facility] Video Visit from 02/25/2021 in [facility]  C-SSRS RISK CATEGORY No Risk No Risk No Risk        Assessment and Plan:  Ashley Franklin is a 51 y.o. year old female with a history of bipolar disorder, anxiety, anemia, obesity s/p gastric bypass surgery, who presents for follow up appointment for below.   1. Bipolar affective disorder, currently depressed, mild (HCC) 2. Anxiety state She continues to report depressive symptoms and anxiety since the last visit. 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  Will uptitrate Depakote to target bipolar disorder/racing thoughts.  Discussed potential risk of increased risk of Stevens-Johnson syndrome with concomitant use of lamotrigine and Depakote. Will consider restarting fluoxetine for depression/anxiety if she has limited benefit from this uptitration.  Will continue Latuda to target bipolar depression.  Will continue clonazepam as needed for anxiety.  Will continue hydroxyzine as needed for anxiety.   3. Akathisia Improving since lowering the dose of Latuda.  Will continue to assess.    # Iron deficiency anemia Labs reviewed at the previous visit. She has microcytic anemia, which was not evident in the last years blood test. She has history of gastric bypass surgery, but has not been any iron supplements for the past few years. According to the chart review, she did upper GI/lower GI with no obvious findings to explain this anemia.She does not have menstrual cycle. She was advised to contact her PCP for father evaluation.      Plan:  Continue lamotrigine 200 mg daily  Continue Latuda 80 mg  daily  Increase Depakote 1000 mg at night  Obtain labs, five days after increasing Depakote (LFT, VPA) Continue Clonazepam 0.5 mg twice a day as needed for anxiety  Continue hydroxyzine 25 mg daily as needed for anxiety - she is not taking this Next appointment: 12/20 at 4:30 for 30 mins, video [email or web address] - on Adderall XR 25 mg daily, armodafinil for narcolepsy - was on Depakote (was on 2000 mg), fluoxetine (was on 60 mg daily)   I have reviewed suicide assessment in detail. No change in the following assessment.    The patient demonstrates the following  risk factors for suicide: Chronic risk factors for suicide include psychiatric disorder /bipolar disorder, previous self-harm of scratching herself. Acute risk factors for suicide include none. Protective factors for this patient include positive social support, positive therapeutic relationship, hope for the future. Considering these factors, the overall suicide risk at this point appears to be low. Patient does have gun access at home and agrees to lock guns. Discussed in detail safety plan that anytime having active suicidal thoughts or homicidal thoughts and she need to call 911 or go to local emergency room.     Collaboration of Care: Collaboration of Care: Other reviewed notes in Epic  Patient/Guardian was advised Release of Information must be obtained prior to  any record release in order to collaborate their care with an outside provider. Patient/Guardian was advised if they have not already done so to contact the registration department to sign all necessary forms in order for [place] to release information regarding their care.   Consent: Patient/Guardian gives verbal consent for treatment and assignment of benefits for services provided during this visit. Patient/Guardian expressed understanding and agreed to proceed.    [name], MD 04/10/2022, 10:32 AM

## 2022-04-07 ENCOUNTER — Other Ambulatory Visit (HOSPITAL_BASED_OUTPATIENT_CLINIC_OR_DEPARTMENT_OTHER): Payer: Self-pay

## 2022-04-10 ENCOUNTER — Encounter: Payer: Self-pay | Admitting: Psychiatry

## 2022-04-10 ENCOUNTER — Other Ambulatory Visit (HOSPITAL_BASED_OUTPATIENT_CLINIC_OR_DEPARTMENT_OTHER): Payer: Self-pay

## 2022-04-10 ENCOUNTER — Telehealth (INDEPENDENT_AMBULATORY_CARE_PROVIDER_SITE_OTHER): Payer: 59 | Admitting: Psychiatry

## 2022-04-10 DIAGNOSIS — G2571 Drug induced akathisia: Secondary | ICD-10-CM | POA: Diagnosis not present

## 2022-04-10 DIAGNOSIS — F411 Generalized anxiety disorder: Secondary | ICD-10-CM | POA: Diagnosis not present

## 2022-04-10 DIAGNOSIS — F3131 Bipolar disorder, current episode depressed, mild: Secondary | ICD-10-CM | POA: Diagnosis not present

## 2022-04-10 MED ORDER — CLONAZEPAM 0.5 MG PO TABS
0.5000 mg | ORAL_TABLET | Freq: Two times a day (BID) | ORAL | 0 refills | Status: DC | PRN
  Filled 2022-04-10 – 2022-04-21 (×2): qty 60, 30d supply, fill #0

## 2022-04-10 MED ORDER — DIVALPROEX SODIUM ER 500 MG PO TB24
1000.0000 mg | ORAL_TABLET | Freq: Every day | ORAL | 0 refills | Status: DC
  Filled 2022-04-10 – 2022-05-02 (×2): qty 60, 30d supply, fill #0

## 2022-04-10 NOTE — Patient Instructions (Signed)
Continue lamotrigine 200 mg daily  Continue Latuda 80 mg daily  Increase Depakote 1000 mg at night  Obtain labs, five days after increasing Depakote (LFT, VPA) Continue Clonazepam 0.5 mg twice a day as needed for anxiety  Continue hydroxyzine 25 mg daily as needed for anxiety Next appointment: 12/20 at 4:30

## 2022-04-21 ENCOUNTER — Other Ambulatory Visit (HOSPITAL_BASED_OUTPATIENT_CLINIC_OR_DEPARTMENT_OTHER): Payer: Self-pay

## 2022-05-02 NOTE — Progress Notes (Unsigned)
Virtual Visit via Video Note  I connected with Ashley Franklin on 05/03/22 at  4:30 PM EST by a video enabled telemedicine application and verified that I am speaking with the correct person using two identifiers.  Location: Patient: work Provider: office Persons participated in the visit- patient, provider    I discussed the limitations of evaluation and management by telemedicine and the availability of in person appointments. The patient expressed understanding and agreed to proceed.    I discussed the assessment and treatment plan with the patient. The patient was provided an opportunity to ask questions and all were answered. The patient agreed with the plan and demonstrated an understanding of the instructions.   The patient was advised to call back or seek an in-person evaluation if the symptoms worsen or if the condition fails to improve as anticipated.  I provided 20 minutes of non-face-to-face time during this encounter.   [name], MD    [facility] MD/PA/NP OP Progress Note  05/03/2022 5:16 PM Ashley Franklin  MRN:  [id number]  Chief Complaint:  Chief Complaint  Patient presents with   Follow-up   HPI:  This is a follow-up appointment for bipolar disorder.  She tearfully describes that she is not doing good at all.  She cannot go 5 minutes without tears.  Although she does not have racing thoughts, she feels trying to do anything.  It occurs when she is away from the house, although she usually does better when she is at home.  She feels bored and some, and thinks that she can never do things right at work.  She occasionally wishes that she would not wake up in the morning, although she denies any suicide intent or plan.  She agrees to contact emergency resources if any worsening.  She sleeps 4 to 5 hours.  She denies decreased need for sleep or euphonia.  She denies alcohol use or drug use.  She agrees with the following medication change.   Visit Diagnosis:    ICD-10-CM    1. Bipolar affective disorder, currently depressed, moderate (HCC)  F31.32     2. Anxiety state  F41.1     3. Akathisia  G25.71       Past Psychiatric History: Please see initial evaluation for full details. I have reviewed the history. No updates at this time.     Past Medical History:  Past Medical History:  Diagnosis Date   Anemia    Anxiety    Bipolar affective disorder (HCC)    Blood transfusion without reported diagnosis    Bronchospasm with bronchitis, acute    Depression    Diabetes (HCC)    diet controlled   Kidney stone    Obesity    Right carpal tunnel syndrome 12/09/2018   SBO (small bowel obstruction) (HCC) 01/2014   Sleep apnea    uses CPAP    Past Surgical History:  Procedure Laterality Date   CARPAL TUNNEL RELEASE Right 08/05/2019   Procedure: RIGHT CARPAL TUNNEL RELEASE;  Surgeon: Cindee Salt, MD;  Location: [facility];  Service: Orthopedics;  Laterality: Right;  IV REGIONAL FOREARM BLOCK   CESAREAN SECTION  1994   ENDOMETRIAL ABLATION     GASTRIC BYPASS  2001   KIDNEY STONE SURGERY  2010   SBO  2015    Family Psychiatric History: Please see initial evaluation for full details. I have reviewed the history. No updates at this time.     Family History:  Family  History  Problem Relation Age of Onset   Diabetes Mother    Cancer Mother 61       Throat    Bipolar disorder Mother    Kidney disease Mother    Heart disease Father    Bipolar disorder Sister    Bipolar disorder Sister    Bipolar disorder Brother    Drug abuse Brother    Colon cancer Maternal Aunt    Kidney disease Maternal Grandmother    Heart disease Paternal Grandfather    Cancer - Prostate Neg Hx    Breast cancer Neg Hx    Esophageal cancer Neg Hx    Rectal cancer Neg Hx    Stomach cancer Neg Hx     Social History:  Social History   Socioeconomic History   Marital status: Married    Spouse name: Not on file   Number of children: 2   Years of  education: Not on file   Highest education level: Not on file  Occupational History   Occupation: office administor  Tobacco Use   Smoking status: Former    Packs/day: 0.25    Years: 6.00    Total pack years: 1.50    Types: Cigarettes   Smokeless tobacco: Never   Tobacco comments:    1/2 ppd   Vaping Use   Vaping Use: Never used  Substance and Sexual Activity   Alcohol use: Yes    Alcohol/week: 0.0 standard drinks of alcohol    Comment: Two times a week.    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, music teacher   Right handed    Caffeine 2 cups daily    Social Determinants of Health   Financial Resource Strain: Not on file  Food Insecurity: Not on file  Transportation Needs: Not on file  Physical Activity: Not on file  Stress: Not on file  Social Connections: Not on file    Allergies:  Allergies  Allergen Reactions   Nsaids Other (See Comments)    Contraindicated with gastric bypass    Metabolic Disorder Labs: Lab Results  Component Value Date   HGBA1C 4.9 02/06/2017   MPG 105 02/26/2015   No results found for: "PROLACTIN" Lab Results  Component Value Date   CHOL 165 02/06/2017   TRIG 87 02/06/2017   HDL 54 02/06/2017   LDLCALC 94 02/06/2017   Lab Results  Component Value Date   TSH 1.12 07/10/2017   TSH 1.22 06/08/2016    Therapeutic Level Labs: No results found for: "LITHIUM" Lab Results  Component Value Date   VALPROATE 67 03/27/2022   VALPROATE 62 11/29/2020   No results found for: "CBMZ"  Current Medications: Current Outpatient Medications  Medication Sig Dispense Refill   FLUoxetine (PROZAC)  20 MG capsule Take 1 capsule (20 mg total) by mouth daily. 30 capsule 0   amphetamine-dextroamphetamine (ADDERALL XR) 25 MG 24 hr capsule Take 1 capsule by mouth every morning. 30 capsule 0   Armodafinil 250 MG tablet Take 1 tablet (250 mg total) by mouth daily 30  tablet 5   Armodafinil 250 MG tablet Take 1 tablet (250 mg total) by mouth daily. 30 tablet 5   [START ON 05/21/2022] clonazePAM (KLONOPIN) 0.5 MG tablet Take 1 tablet (0.5 mg total) by mouth 2 (two) times daily as needed for anxiety. 60 tablet 0   divalproex (DEPAKOTE ER) 500 MG 24 hr tablet Take 2 tablets (1,000 mg total) by mouth at bedtime. 60 tablet 0   hydrOXYzine (ATARAX) 25 MG tablet Take 1 tablet (25 mg total) by mouth daily as needed for anxiety. 30 tablet 2   lamoTRIgine (LAMICTAL) 200 MG tablet Take 1 tablet (200 mg total) by mouth daily. 30 tablet 2   lubiprostone (AMITIZA) 24 MCG capsule Take 1 capsule (24 mcg total) by mouth 2 (two) times daily. 120 capsule 3   [START ON 05/06/2022] lurasidone (LATUDA) 80 MG TABS tablet Take 1 tablet (80 mg total) by mouth daily with breakfast. 30 tablet 0   omeprazole (PRILOSEC) 20 MG capsule Take 1 capsule (20 mg total) by mouth 2 (two) times daily before a meal. Best to take on an empty stomach. 120 capsule 3   pantoprazole (PROTONIX) 40 MG tablet Take 1 tablet (40 mg total) by mouth 2 (two) times daily. 180 tablet 0   No current facility-administered medications for this visit.     Musculoskeletal: Strength & Muscle Tone:  N/A Gait & Station:  N/A Patient leans: N/A  Psychiatric Specialty Exam: Review of Systems  Psychiatric/Behavioral:  Positive for decreased concentration, dysphoric mood, sleep disturbance and suicidal ideas. Negative for agitation, behavioral problems, confusion, hallucinations and self-injury. The patient is nervous/anxious. The patient is not hyperactive.   All other systems reviewed and are negative.   There were no vitals taken for this visit.There is no  height or weight on file to calculate BMI.  General Appearance: Fairly Groomed  Eye Contact:  Good  Speech:  Clear and Coherent  Volume:  Normal  Mood:  Anxious and Depressed  Affect:  Appropriate, Congruent, and Tearful  Thought Process:  Coherent  Orientation:  Full (Time, Place, and Person)  Thought Content: Logical   Suicidal Thoughts:  Yes.  without intent/plan  Homicidal Thoughts:  No  Memory:  Immediate;   Good  Judgement:  Good  Insight:  Good  Psychomotor Activity:  Normal  Concentration:  Concentration: Good and Attention Span: Good  Recall:  Good  Fund of Knowledge: Good  Language: Good  Akathisia:  No  Handed:  Right  AIMS (if indicated): not done  Assets:  Communication Skills Desire for Improvement  ADL's:  Intact  Cognition: WNL  Sleep:  Poor   Screenings: PHQ2-9    Flowsheet Row Office Visit from 11/08/2021 in [facility] Video Visit from 06/03/2021 in [facility] Video Visit from 02/25/2021 in [facility] Office Visit from 11/29/2020 in [facility] Video Visit from 08/31/2020 in [facility]  PHQ-2 Total Score 4 0 0 1 0  PHQ-9 Total Score 15 -- -- -- --      Flowsheet Row Office Visit from 11/08/2021 in [facility] ED from 08/11/2021 in [facility] Video Visit from 02/25/2021 in [facility]  C-SSRS RISK CATEGORY No Risk No Risk No Risk        Assessment and Plan:  LUCIE FRIEDLANDER is a 51 y.o. year old female with a history of bipolar disorder, anxiety, anemia, obesity s/p gastric bypass  surgery, who presents for follow up appointment for below.   1. Bipolar affective disorder, currently depressed, moderate (HCC) 2. Anxiety state Exam is notable for tearfulness, and she reports worsening in depressive symptoms and anxiety, although  there is some improvement in racing thoughts since uptitration of Depakote. 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  Will restart fluoxetine to target depression/anxiety given her mood was stable prior to discontinuation of this medication.  Discussed potential risk of medication induced mania.  Will continue Latuda, lamotrigine to target bipolar depression.  Will continue Depakote to target bipolar disorder. Will judiciously use this medication given its interaction of lamotrigine. She was reminded to obtain blood test for monitoring.  Will continue clonazepam as needed and hydroxyzine as needed for anxiety.   3. Akathisia Overall improving since lowering the dose of Latuda.  Will continue to assess this.    # Iron deficiency anemia Labs reviewed at the previous visit. She has microcytic anemia, which was not evident in the last years blood test. She has history of gastric bypass surgery, but has not been any iron supplements for the past few years. According to the chart review, she did upper GI/lower GI with no obvious findings to explain this anemia.She does not have menstrual cycle. She was advised to contact her PCP for further evaluation.      Plan:  Continue lamotrigine 200 mg daily  Continue Latuda 80 mg daily - akathisia from 100 mg.  Start fluoxetine 20 mg daily  Increase Depakote 1000 mg at night  Obtain EKG [phone]  QTc 464 msec 09/2020 Obtain labs-  Depakote (LFT, VPA) Continue clonazepam 0.5 mg twice a day as needed for anxiety  Continue hydroxyzine 25 mg daily as needed for anxiety  Next appointment: 1/17 at 3'30 for 30 mins, video [email or web address] - on Adderall XR 25 mg daily, armodafinil for narcolepsy - was on Depakote (was on 2000 mg), fluoxetine (was on 60 mg daily)   The patient demonstrates the following  risk factors for suicide: Chronic risk factors  for suicide include psychiatric disorder /bipolar disorder, previous self-harm of scratching herself. Acute risk factors for suicide include none. Protective factors for this patient include positive social support, positive therapeutic relationship, hope for the future. Considering these factors, the overall suicide risk at this point appears to be low. Patient does have gun access at home and agrees to lock guns. Discussed in detail safety plan that anytime having active suicidal thoughts or homicidal thoughts and she need to call 911 or go to local emergency room.     I have utilized the [place] Controlled Substances Reporting System (PMP AWARxE) to confirm adherence regarding the patient's medication. My review reveals appropriate prescription fills.   This clinician has discussed the side effect associated with medication prescribed during this encounter. Please refer to notes in the previous encounters for more details.     Collaboration of Care: Collaboration of Care: Other reviewed notes in Epic  Patient/Guardian was advised Release of Information must be obtained prior to any record release in order to collaborate their care with an outside provider. Patient/Guardian was advised if they have not already done so to contact the registration department to sign all necessary forms in order for us to release information regarding their care.   Consent: Patient/Guardian gives verbal consent for treatment and assignment of benefits for services provided during this visit. Patient/Guardian expressed understanding and agreed  to proceed.    [name], MD 05/03/2022, 5:16 PM

## 2022-05-03 ENCOUNTER — Other Ambulatory Visit (HOSPITAL_BASED_OUTPATIENT_CLINIC_OR_DEPARTMENT_OTHER): Payer: Self-pay

## 2022-05-03 ENCOUNTER — Encounter: Payer: Self-pay | Admitting: Psychiatry

## 2022-05-03 ENCOUNTER — Telehealth (INDEPENDENT_AMBULATORY_CARE_PROVIDER_SITE_OTHER): Payer: 59 | Admitting: Psychiatry

## 2022-05-03 DIAGNOSIS — F411 Generalized anxiety disorder: Secondary | ICD-10-CM | POA: Diagnosis not present

## 2022-05-03 DIAGNOSIS — F3132 Bipolar disorder, current episode depressed, moderate: Secondary | ICD-10-CM

## 2022-05-03 DIAGNOSIS — G2571 Drug induced akathisia: Secondary | ICD-10-CM

## 2022-05-03 MED ORDER — CLONAZEPAM 0.5 MG PO TABS
0.5000 mg | ORAL_TABLET | Freq: Two times a day (BID) | ORAL | 0 refills | Status: DC | PRN
  Filled 2022-05-03 – 2022-05-26 (×3): qty 60, 30d supply, fill #0

## 2022-05-03 MED ORDER — LURASIDONE HCL 80 MG PO TABS
80.0000 mg | ORAL_TABLET | Freq: Every day | ORAL | 0 refills | Status: DC
  Filled 2022-05-03: qty 30, 30d supply, fill #0

## 2022-05-03 MED ORDER — FLUOXETINE HCL 20 MG PO CAPS
20.0000 mg | ORAL_CAPSULE | Freq: Every day | ORAL | 0 refills | Status: DC
  Filled 2022-05-03: qty 30, 30d supply, fill #0

## 2022-05-03 NOTE — Patient Instructions (Signed)
Continue lamotrigine 200 mg daily  Continue Latuda 80 mg daily  464 09/2020 Start fluoxetine 20 mg daily  Increase Depakote 1000 mg at night  Obtain EKG- Please call [phone] to make an appointment to get EKG. Obtain labs-  Depakote (LFT, VPA) Continue clonazepam 0.5 mg twice a day as needed for anxiety  Continue hydroxyzine 25 mg daily as needed for anxiety  Next appointment: 1/17 at 3 30

## 2022-05-10 ENCOUNTER — Other Ambulatory Visit (HOSPITAL_BASED_OUTPATIENT_CLINIC_OR_DEPARTMENT_OTHER): Payer: Self-pay

## 2022-05-10 MED ORDER — AMPHETAMINE-DEXTROAMPHET ER 25 MG PO CP24
25.0000 mg | ORAL_CAPSULE | Freq: Every morning | ORAL | 0 refills | Status: DC
  Filled 2022-05-10: qty 30, 30d supply, fill #0

## 2022-05-24 ENCOUNTER — Other Ambulatory Visit (HOSPITAL_BASED_OUTPATIENT_CLINIC_OR_DEPARTMENT_OTHER): Payer: Self-pay

## 2022-05-26 ENCOUNTER — Other Ambulatory Visit: Payer: Self-pay

## 2022-05-26 ENCOUNTER — Other Ambulatory Visit (HOSPITAL_BASED_OUTPATIENT_CLINIC_OR_DEPARTMENT_OTHER): Payer: Self-pay

## 2022-05-29 NOTE — Progress Notes (Signed)
Virtual Visit via Video Note  I connected with [facility] on 05/31/22 at  3:30 PM EST by a video enabled telemedicine application and verified that I am speaking with the correct person using two identifiers.  Location: Patient: work Provider: office Persons participated in the visit- patient, provider    I discussed the limitations of evaluation and management by telemedicine and the availability of in person appointments. The patient expressed understanding and agreed to proceed.    I discussed the assessment and treatment plan with the patient. The patient was provided an opportunity to ask questions and all were answered. The patient agreed with the plan and demonstrated an understanding of the instructions.   The patient was advised to call back or seek an in-person evaluation if the symptoms worsen or if the condition fails to improve as anticipated.  I provided 20 minutes of non-face-to-face time during this encounter.   [name], MD   [facility] MD/PA/NP OP Progress Note  05/31/2022 4:10 PM [facility]  MRN:  [id number]  Chief Complaint:  Chief Complaint  Patient presents with   Follow-up   HPI:  This is a follow-up appointment for bipolar disorder and anxiety.  She states that she has been doing much better.  Although she still feels anxious, she thinks it is normal anxiety.  She is stressed about the next day, thinking about what is ahead.  She believes her anxiety is 100 times better.  She found herself itchy, although she was not specify the reason. It was beyond panic attack.  She was to work regularly, and has started last semester.  She will be graduating in 160 days.  This gives her a sense of relief, and she finds it helpful to have something to focus on.  She has cut out social activity due to anxiety.  Although she enjoyed seeing her family on Christmas and wants to see them again, she feels overwhelmed with thoughts such as getting her cat ready, taking a  shower, and the need to be there on time.  She sleeps 7 hours.  She sleeps in the bedroom.  She is unable to get inside the blanket as it gives her the fear of starting the day again.  She denies change in appetite.  She denies SI.  She denies decreased need for sleep or euphonia.  She denies hallucinations.  She denies alcohol use or drug use.  She feels comfortable to stay on the medication as it is for now.    Visit Diagnosis:    ICD-10-CM   1. Bipolar affective disorder, currently depressed, moderate (HCC)  F31.32     2. Anemia, unspecified type  D64.9 CBC    Iron, TIBC and Ferritin Panel    3. Anxiety state  F41.1       Past Psychiatric History: Please see initial evaluation for full details. I have reviewed the history. No updates at this time.     Past Medical History:  Past Medical History:  Diagnosis Date   Anemia    Anxiety    Bipolar affective disorder (HCC)    Blood transfusion without reported diagnosis    Bronchospasm with bronchitis, acute    Depression    Diabetes (HCC)    diet controlled   Kidney stone    Obesity    Right carpal tunnel syndrome 12/09/2018   SBO (small bowel obstruction) (HCC) 01/2014   Sleep apnea    uses CPAP    Past Surgical History:  Procedure Laterality Date   CARPAL TUNNEL RELEASE Right 08/05/2019   Procedure: RIGHT CARPAL TUNNEL RELEASE;  Surgeon: Daryll Brod, MD;  Location: [facility];  Service: Orthopedics;  Laterality: Right;  IV REGIONAL FOREARM BLOCK   CESAREAN SECTION  1994   ENDOMETRIAL ABLATION     GASTRIC BYPASS  2001   KIDNEY STONE SURGERY  2010   SBO  2015    Family Psychiatric History: Please see initial evaluation for full details. I have reviewed the history. No updates at this time.     Family History:  Family History  Problem Relation Age of Onset   Diabetes Mother    Cancer Mother 36       Throat    Bipolar disorder Mother    Kidney disease Mother    Heart disease Father    Bipolar  disorder Sister    Bipolar disorder Sister    Bipolar disorder Brother    Drug abuse Brother    Colon cancer Maternal Aunt    Kidney disease Maternal Grandmother    Heart disease Paternal Grandfather    Cancer - Prostate Neg Hx    Breast cancer Neg Hx    Esophageal cancer Neg Hx    Rectal cancer Neg Hx    Stomach cancer Neg Hx     Social History:  Social History   Socioeconomic History   Marital status: Married    Spouse name: Not on file   Number of children: 2   Years of education: Not on file   Highest education level: Not on file  Occupational History   Occupation: office administor  Tobacco Use   Smoking status: Former    Packs/day: 0.25    Years: 6.00    Total pack years: 1.50    Types: Cigarettes   Smokeless tobacco: Never   Tobacco comments:    1/2 ppd   Vaping Use   Vaping Use: Never used  Substance and Sexual Activity   Alcohol use: Yes    Alcohol/week: 0.0 standard drinks of alcohol    Comment: Two times a week.    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, music teacher   Right handed    Caffeine 2 cups daily    Social Determinants of Health   Financial Resource Strain: Not on file  Food Insecurity: Not on file  Transportation Needs: Not on file  Physical Activity: Not on file  Stress: Not on file  Social Connections: Not on file    Allergies:  Allergies  Allergen Reactions   Nsaids Other (See Comments)    Contraindicated with gastric bypass    Metabolic Disorder Labs: Lab Results  Component Value Date   HGBA1C 4.9 02/06/2017   MPG 105 02/26/2015   No results found for: "PROLACTIN" Lab Results   Component Value Date   CHOL 165 02/06/2017   TRIG 87 02/06/2017   HDL 54 02/06/2017   LDLCALC 94 02/06/2017   Lab Results  Component Value Date   TSH 1.12 07/10/2017   TSH 1.22 06/08/2016  Therapeutic Level Labs: No results found for: "LITHIUM" Lab Results  Component Value Date   VALPROATE 67 03/27/2022   VALPROATE 62 11/29/2020   No results found for: "CBMZ"  Current Medications: Current Outpatient Medications  Medication Sig Dispense Refill   amphetamine-dextroamphetamine (ADDERALL XR) 25 MG 24 hr capsule Take 1 capsule by mouth every morning. 30 capsule 0   Armodafinil 250 MG tablet Take 1 tablet (250 mg total) by mouth daily 30 tablet 5   Armodafinil 250 MG tablet Take 1 tablet (250 mg total) by mouth daily. 30 tablet 5   clonazePAM (KLONOPIN) 0.5 MG tablet Take 1 tablet (0.5 mg total) by mouth 2 (two) times daily as needed for anxiety. 60 tablet 0   [START ON 06/04/2022] divalproex (DEPAKOTE ER) 500 MG 24 hr tablet Take 2 tablets (1,000 mg total) by mouth at bedtime. 60 tablet 2   [START ON 06/04/2022] FLUoxetine (PROZAC) 20 MG capsule Take 1 capsule (20 mg total) by mouth daily. 30 capsule 0   hydrOXYzine (ATARAX) 25 MG tablet Take 1 tablet (25 mg total) by mouth daily as needed for anxiety. 30 tablet 2   lamoTRIgine (LAMICTAL) 200 MG tablet Take 1 tablet (200 mg total) by mouth daily. 30 tablet 2   lubiprostone (AMITIZA) 24 MCG capsule Take 1 capsule (24 mcg total) by mouth 2 (two) times daily. 120 capsule 3   [START ON 06/05/2022] lurasidone (LATUDA) 80 MG TABS tablet Take 1 tablet (80 mg total) by mouth daily with breakfast. 30 tablet 2   omeprazole (PRILOSEC) 20 MG capsule Take 1 capsule (20 mg total) by mouth 2 (two) times daily before a meal. Best to take on an empty stomach. 120 capsule 3   pantoprazole (PROTONIX) 40 MG tablet Take 1 tablet (40 mg total) by mouth 2 (two) times daily. 180 tablet 0   No current facility-administered medications for this visit.      Musculoskeletal: Strength & Muscle Tone:  N/A Gait & Station:  N/A Patient leans: N/A  Psychiatric Specialty Exam: Review of Systems  Psychiatric/Behavioral:  Positive for dysphoric mood. Negative for agitation, behavioral problems, confusion, decreased concentration, hallucinations, self-injury, sleep disturbance and suicidal ideas. The patient is nervous/anxious. The patient is not hyperactive.   All other systems reviewed and are negative.   There were no vitals taken for this visit.There is no height or weight on file to calculate BMI.  General Appearance: Fairly Groomed  Eye Contact:  Good  Speech:  Clear and Coherent  Volume:  Normal  Mood:   better  Affect:  Appropriate, Congruent, and calmer  Thought Process:  Coherent  Orientation:  Full (Time, Place, and Person)  Thought Content: Logical   Suicidal Thoughts:  No  Homicidal Thoughts:  No  Memory:  Immediate;   Good  Judgement:  Good  Insight:  Good  Psychomotor Activity:  Normal  Concentration:  Concentration: Good and Attention Span: Good  Recall:  Good  Fund of Knowledge: Good  Language: Good  Akathisia:  No  Handed:  Right  AIMS (if indicated): not done  Assets:  Communication Skills Desire for Improvement  ADL's:  Intact  Cognition: WNL  Sleep:  Good   Screenings: PHQ2-9    [place] Office Visit from 11/08/2021 in [place] Video Visit from 06/03/2021 in [place] Video Visit from 02/25/2021 in [place] Office Visit from 11/29/2020 in [place] Video Visit from 08/31/2020 in [place]  PHQ-2 Total Score 4 0 0 1 0  PHQ-9 Total Score 15 -- -- -- --      Flowsheet Row Office Visit from 11/08/2021 in [facility] ED from 08/11/2021 in [facility] Video Visit from 02/25/2021 in [facility]  C-SSRS RISK CATEGORY No Risk No Risk No Risk        Assessment and Plan:  MAIE KESINGER is a 52 y.o. year old female with a history of bipolar disorder, anxiety, anemia, obesity s/p gastric bypass surgery, who presents for follow up appointment for below.    2. Bipolar affective disorder, currently depressed, moderate (HCC) 3. Anxiety state R/o akathisia Exam is notable for brighter affect, and she reports significant improvement in anxiety since starting fluoxetine. 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  Will continue current dose of fluoxetine at this time to see if it exerts its full benefit.  Will continue lamotrigine, Latuda to target bipolar depression .  Noted that she may have akathisia from a higher dose of Latuda, although it has been improving since lowering the dose.  Will continue Depakote to target bipolar disorder.  Previously discussed the risk of medication interaction with lamotrigine.  She was reminded again to obtain blood test for monitoring.  Will continue clonazepam and hydroxyzine as needed for anxiety.   1. Anemia, unspecified type There was worsening in microcytic anemia, which was not evident in the previous year. She has history of gastric bypass surgery, but has not been any iron supplements for the past few years. According to the chart review, she did upper GI/lower GI with no obvious findings to explain this anemia.She does not have menstrual cycle.  Will obtain for monitoring given she also reports restless leg.  She was advised to contact her PCP for further evaluation.     Plan:  Continue lamotrigine 200 mg daily  Continue Latuda 80 mg daily - akathisia from 100 mg.  Continue fluoxetine 20 mg daily  Continue Depakote 1000 mg at night  Obtain labs- (LFT, VPA, CBC, iron panels) She will forward EKG result to the clinic  QTc 464 msec  09/2020 Continue clonazepam 0.5 mg twice a day as needed for anxiety, twice a week Continue hydroxyzine 25 mg daily as needed for anxiety - rarely takes it Next appointment: 2/16 at 10:30  for 30 mins, video [email or web address] - on Adderall XR 25 mg daily, armodafinil for narcolepsy - was on Depakote (was on 2000 mg), fluoxetine (was on 60 mg daily)   The patient demonstrates the following  risk factors for suicide: Chronic risk factors for suicide include psychiatric disorder /bipolar disorder, previous self-harm of scratching herself. Acute risk factors for suicide include none. Protective factors for this patient include positive social support, positive therapeutic relationship, hope for the future. Considering these factors, the overall suicide risk at this point appears to be low. Patient does have gun access at home and agrees to lock guns. Discussed in detail safety plan that anytime having active suicidal thoughts or homicidal thoughts and she need to call 911 or go to local emergency room.        Collaboration of Care: Collaboration of Care: Other reviewed notes in Epic  Patient/Guardian was advised Release of Information must be obtained prior to any record release in order to collaborate their care with an outside provider. Patient/Guardian was advised if they  have not already done so to contact the registration department to sign all necessary forms in order for [place] to release information regarding their care.   Consent: Patient/Guardian gives verbal consent for treatment and assignment of benefits for services provided during this visit. Patient/Guardian expressed understanding and agreed to proceed.    [name], MD 05/31/2022, 4:10 PM

## 2022-05-31 ENCOUNTER — Encounter: Payer: Self-pay | Admitting: Psychiatry

## 2022-05-31 ENCOUNTER — Telehealth (INDEPENDENT_AMBULATORY_CARE_PROVIDER_SITE_OTHER): Payer: 59 | Admitting: Psychiatry

## 2022-05-31 ENCOUNTER — Other Ambulatory Visit (HOSPITAL_BASED_OUTPATIENT_CLINIC_OR_DEPARTMENT_OTHER): Payer: Self-pay

## 2022-05-31 DIAGNOSIS — D649 Anemia, unspecified: Secondary | ICD-10-CM

## 2022-05-31 DIAGNOSIS — F411 Generalized anxiety disorder: Secondary | ICD-10-CM | POA: Diagnosis not present

## 2022-05-31 DIAGNOSIS — F3132 Bipolar disorder, current episode depressed, moderate: Secondary | ICD-10-CM

## 2022-05-31 MED ORDER — FLUOXETINE HCL 20 MG PO CAPS
20.0000 mg | ORAL_CAPSULE | Freq: Every day | ORAL | 0 refills | Status: DC
  Filled 2022-05-31: qty 30, 30d supply, fill #0

## 2022-05-31 MED ORDER — LURASIDONE HCL 80 MG PO TABS
80.0000 mg | ORAL_TABLET | Freq: Every day | ORAL | 2 refills | Status: DC
  Filled 2022-05-31: qty 30, 30d supply, fill #0
  Filled 2022-07-23: qty 30, 30d supply, fill #1
  Filled 2022-08-16: qty 30, 30d supply, fill #2

## 2022-05-31 MED ORDER — DIVALPROEX SODIUM ER 500 MG PO TB24
1000.0000 mg | ORAL_TABLET | Freq: Every day | ORAL | 2 refills | Status: DC
  Filled 2022-05-31: qty 60, 30d supply, fill #0
  Filled 2022-07-07: qty 60, 30d supply, fill #1
  Filled 2022-08-16: qty 60, 30d supply, fill #2

## 2022-05-31 NOTE — Patient Instructions (Addendum)
Continue lamotrigine 200 mg daily  Continue Latuda 80 mg daily  Continue fluoxetine 20 mg daily  Continue Depakote 1000 mg at night  Obtain labs-  (LFT, VPA, CBC, iron panels) Please send EKG result to the clinic  Continue clonazepam 0.5 mg twice a day as needed for anxiety Continue hydroxyzine 25 mg daily as needed for anxiety  Next appointment: 2/16 at 10:30

## 2022-06-01 ENCOUNTER — Other Ambulatory Visit: Payer: Self-pay

## 2022-06-05 ENCOUNTER — Other Ambulatory Visit (HOSPITAL_BASED_OUTPATIENT_CLINIC_OR_DEPARTMENT_OTHER): Payer: Self-pay

## 2022-06-06 ENCOUNTER — Other Ambulatory Visit (HOSPITAL_BASED_OUTPATIENT_CLINIC_OR_DEPARTMENT_OTHER): Payer: Self-pay

## 2022-06-06 MED ORDER — AMPHETAMINE-DEXTROAMPHET ER 25 MG PO CP24
25.0000 mg | ORAL_CAPSULE | Freq: Every morning | ORAL | 0 refills | Status: DC
  Filled 2022-06-09: qty 30, 30d supply, fill #0

## 2022-06-09 ENCOUNTER — Other Ambulatory Visit (HOSPITAL_BASED_OUTPATIENT_CLINIC_OR_DEPARTMENT_OTHER): Payer: Self-pay

## 2022-06-22 ENCOUNTER — Telehealth: Payer: Self-pay

## 2022-06-22 NOTE — Telephone Encounter (Signed)
Please advise her that checking glucose is not necessary at this time, as it was tested a few months ago and found to be within the normal range.

## 2022-06-22 NOTE — Telephone Encounter (Signed)
pt left a message to see if you would also add to her lab work a fasting blood sugar to her labwork orders.

## 2022-06-22 NOTE — Telephone Encounter (Signed)
Pt.notified

## 2022-06-28 NOTE — Progress Notes (Signed)
Virtual Visit via Video Note  I connected with Ashley Franklin on 06/30/22 at 10:30 AM EST by a video enabled telemedicine application and verified that I am speaking with the correct person using two identifiers.  Location: Patient: work Provider: office Persons participated in the visit- patient, provider    I discussed the limitations of evaluation and management by telemedicine and the availability of in person appointments. The patient expressed understanding and agreed to proceed.     I discussed the assessment and treatment plan with the patient. The patient was provided an opportunity to ask questions and all were answered. The patient agreed with the plan and demonstrated an understanding of the instructions.   The patient was advised to call back or seek an in-person evaluation if the symptoms worsen or if the condition fails to improve as anticipated.  I provided 20 minutes of non-face-to-face time during this encounter.   Norman Clay, MD   [facility] MD/PA/NP OP Progress Note  06/30/2022 11:10 AM Ashley Franklin  MRN:  [id number]  Chief Complaint:  Chief Complaint  Patient presents with   Follow-up   HPI:  This is a follow-up appointment for bipolar disorder and anxiety.  She states that she is not feeling well again.  She feels tightness in the chest.  She feels fight/flight.  She has been busy at work, and has been able to do things.  However, people were starting to notice about her leg shaking.  She feels something is crawling out of skin.  She canceled membership at [employer].  She stays in the house most of the time when she does not work.  She feels that she is fading away.  She states that she used to be a mom and wife. Her father is 56.  She states that there are not so many people who know her.  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  Although she send clonazepam has been helpful to take edge  off, she continues to feel anxious.  She sleeps up to 7 hours.  She denies change in appetite.  She denies SI.  She denies decreased need for sleep or euphonia.  She denies alcohol use or drug use.  She is willing to try higher dose of fluoxetine at this time.    Visit Diagnosis:    ICD-10-CM   1. Bipolar affective disorder, currently depressed, moderate (HCC)  F31.32 Comprehensive metabolic panel    2. Anxiety state  F41.1     3. Anemia, unspecified type  D64.9       Past Psychiatric History: Please see initial evaluation for full details. I have reviewed the history. No updates at this time.     Past Medical History:  Past Medical History:  Diagnosis Date   Anemia    Anxiety    Bipolar affective disorder ([place])    Blood transfusion without reported diagnosis    Bronchospasm with bronchitis, acute    Depression    Diabetes ([place])    diet controlled   Kidney stone    Obesity    Right carpal tunnel syndrome 12/09/2018   SBO (small bowel obstruction) ([place]) 01/2014   Sleep apnea    uses CPAP    Past Surgical History:  Procedure Laterality Date   CARPAL TUNNEL RELEASE Right 08/05/2019   Procedure: RIGHT CARPAL TUNNEL RELEASE;  Surgeon: Daryll Brod, MD;  Location: [facility];  Service: Orthopedics;  Laterality: Right;  IV REGIONAL FOREARM BLOCK   CESAREAN SECTION  1994   ENDOMETRIAL ABLATION     GASTRIC BYPASS  2001   KIDNEY STONE SURGERY  2010   SBO  2015    Family Psychiatric History: Please see initial evaluation for full details. I have reviewed the history. No updates at this time.     Family History:  Family History  Problem Relation Age of Onset   Diabetes Mother    Cancer Mother 67       Throat    Bipolar disorder Mother    Kidney disease Mother    Heart disease Father    Bipolar disorder Sister    Bipolar disorder Sister    Bipolar disorder Brother    Drug abuse Brother    Colon cancer Maternal Aunt    Kidney disease Maternal Grandmother     Heart disease Paternal Grandfather    Cancer - Prostate Neg Hx    Breast cancer Neg Hx    Esophageal cancer Neg Hx    Rectal cancer Neg Hx    Stomach cancer Neg Hx     Social History:  Social History   Socioeconomic History   Marital status: Married    Spouse name: Not on file   Number of children: 2   Years of education: Not on file   Highest education level: Not on file  Occupational History   Occupation: office administor  Tobacco Use   Smoking status: Former    Packs/day: 0.25    Years: 6.00    Total pack years: 1.50    Types: Cigarettes   Smokeless tobacco: Never   Tobacco comments:    1/2 ppd   Vaping Use   Vaping Use: Never used  Substance and Sexual Activity   Alcohol use: Yes    Alcohol/week: 0.0 standard drinks of alcohol    Comment: Two times a week.    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, music teacher   Right handed    Caffeine 2 cups daily    Social Determinants of Health   Financial Resource Strain: Not on file  Food Insecurity: Not on file  Transportation Needs: Not on file  Physical Activity: Not on file  Stress: Not on file  Social Connections: Not on file    Allergies:  Allergies  Allergen Reactions   Nsaids Other (See Comments)    Contraindicated with gastric bypass    Metabolic Disorder Labs: Lab Results  Component Value Date   HGBA1C 4.9 02/06/2017   MPG 105 02/26/2015   No results found for: "PROLACTIN" Lab Results  Component Value Date   CHOL 165 02/06/2017   TRIG 87 02/06/2017   HDL 54 02/06/2017   LDLCALC 94 02/06/2017   Lab Results  Component Value Date   TSH 1.12  07/10/2017   TSH 1.22 06/08/2016    Therapeutic Level Labs: No results found for: "LITHIUM" Lab Results  Component Value Date   VALPROATE 67 03/27/2022   VALPROATE 62 11/29/2020  No results found for: "CBMZ"  Current Medications: Current Outpatient Medications  Medication Sig Dispense Refill   amphetamine-dextroamphetamine (ADDERALL XR) 25 MG 24 hr capsule Take 1 capsule by mouth every morning. 30 capsule 0   Armodafinil 250 MG tablet Take 1 tablet (250 mg total) by mouth daily 30 tablet 5   Armodafinil 250 MG tablet Take 1 tablet (250 mg total) by mouth daily. 30 tablet 5   clonazePAM (KLONOPIN) 0.5 MG tablet Take 1 tablet (0.5 mg total) by mouth 2 (two) times daily as needed for anxiety. 60 tablet 1   divalproex (DEPAKOTE ER) 500 MG 24 hr tablet Take 2 tablets (1,000 mg total) by mouth at bedtime. 60 tablet 2   FLUoxetine (PROZAC) 40 MG capsule Take 1 capsule (40 mg total) by mouth daily. 30 capsule 1   [START ON 07/05/2022] lamoTRIgine (LAMICTAL) 200 MG tablet Take 1 tablet (200 mg total) by mouth daily. 30 tablet 4   lubiprostone (AMITIZA) 24 MCG capsule Take 1 capsule (24 mcg total) by mouth 2 (two) times daily. 120 capsule 3   lurasidone (LATUDA) 80 MG TABS tablet Take 1 tablet (80 mg total) by mouth daily with breakfast. 30 tablet 2   omeprazole (PRILOSEC) 20 MG capsule Take 1 capsule (20 mg total) by mouth 2 (two) times daily before a meal. Best to take on an empty stomach. 120 capsule 3   pantoprazole (PROTONIX) 40 MG tablet Take 1 tablet (40 mg total) by mouth 2 (two) times daily. 180 tablet 0   No current facility-administered medications for this visit.     Musculoskeletal: Strength & Muscle Tone:  N/A Gait & Station:  N/A Patient leans: N/A  Psychiatric Specialty Exam: Review of Systems  Psychiatric/Behavioral:  Positive for dysphoric mood and sleep disturbance. Negative for agitation, behavioral problems, confusion, decreased concentration, hallucinations,  self-injury and suicidal ideas. The patient is nervous/anxious. The patient is not hyperactive.   All other systems reviewed and are negative.   There were no vitals taken for this visit.There is no height or weight on file to calculate BMI.  General Appearance: Fairly Groomed  Eye Contact:  Good  Speech:  Clear and Coherent  Volume:  Normal  Mood:  Anxious  Affect:  Appropriate, Congruent, and Tearful  Thought Process:  Coherent  Orientation:  Full (Time, Place, and Person)  Thought Content: Logical   Suicidal Thoughts:  No  Homicidal Thoughts:  No  Memory:  Immediate;   Good  Judgement:  Good  Insight:  Good  Psychomotor Activity:  Normal  Concentration:  Concentration: Good and Attention Span: Good  Recall:  Good  Fund of Knowledge: Good  Language: Good  Akathisia:  No  Handed:  Right  AIMS (if indicated): not done  Assets:  Communication Skills Desire for Improvement  ADL's:  Intact  Cognition: WNL  Sleep:   hypersomnia   Screenings: [facility] Office Visit from 11/08/2021 in [place] Video Visit from 06/03/2021 in [place] Video Visit from 02/25/2021 in [place] Office Visit from 11/29/2020 in [place] Video Visit from 08/31/2020 in [place]  PHQ-2 Total Score 4 0 0 1 0  PHQ-9 Total Score 15 -- -- -- --      [place] Office Visit from 11/08/2021 in [place] [facility] from 08/11/2021 in [facility] at  [place] Video Visit from 02/25/2021 in [facility]  C-SSRS RISK CATEGORY No Risk No Risk No Risk        Assessment and Plan:  Ashley Franklin is a 52 y.o. year old female with a history of bipolar disorder, anxiety, anemia, obesity s/p gastric  bypass surgery, who presents for follow up appointment for below.    1. Bipolar affective disorder, currently depressed, moderate ([place]) 2. Anxiety state Acute stressors include:  Other stressors include: separation, school work, her mother with cancer in spine    History:   There has been worsening in depressive symptoms and anxiety since the last visit.  Will uptitrate fluoxetine to optimize benefit for depression while monitoring medication induced mania.  Will continue lamotrigine, Latuda to target bipolar depression.  Will consider tapering down Latuda in the future if any concern of akathisia.  She was advised again to obtain blood test for monitoring Depakote.  Will continue clonazepam and hydroxyzine as needed for anxiety.   1. Anemia, unspecified type There was worsening in microcytic anemia, which was not evident in the previous year. She has history of gastric bypass surgery, but has not been any iron supplements for the past few years. According to the chart review, she did upper GI/lower GI with no obvious findings to explain this anemia.She does not have menstrual cycle.  She was advised again to obtain labs for monitoring given she also reports restless leg.  She was advised to contact her PCP for further evaluation.      Plan:  Continue lamotrigine 200 mg daily  Continue Latuda 80 mg daily - akathisia from 100 mg.  Increase fluoxetine 40 mg daily  Continue Depakote 1000 mg at night  Obtain labs- (CMP VPA, CBC, iron panels) She will forward EKG result to the clinic  QTc 464 msec 09/2020 Continue clonazepam 0.5 mg twice a day as needed for anxiety, twice a week Continue hydroxyzine 25 mg daily as needed for anxiety - rarely takes it Next appointment: 4/3 at 3 PM  for 30 mins, video [email or web address] - on Adderall XR 25 mg daily, armodafinil for narcolepsy - was on Depakote (was on 2000 mg), fluoxetine (was on 60 mg daily)   The patient demonstrates the following   risk factors for suicide: Chronic risk factors for suicide include psychiatric disorder /bipolar disorder, previous self-harm of scratching herself. Acute risk factors for suicide include none. Protective factors for this patient include positive social support, positive therapeutic relationship, hope for the future. Considering these factors, the overall suicide risk at this point appears to be low. Patient does have gun access at home and agrees to lock guns. Discussed in detail safety plan that anytime having active suicidal thoughts or homicidal thoughts and she need to call 911 or go to local emergency room.         Collaboration of Care: Collaboration of Care: Other reviewed notes in Epic  Patient/Guardian was advised Release of Information must be obtained prior to any record release in order to collaborate their care with an outside provider. Patient/Guardian was advised if they have not already done so to contact the registration department to sign all necessary forms in order for [place] to release information regarding their care.   Consent: Patient/Guardian gives verbal consent for treatment and assignment of benefits for services provided during this visit. Patient/Guardian expressed understanding and agreed to proceed.    Norman Clay, MD 06/30/2022, 11:10 AM

## 2022-06-30 ENCOUNTER — Encounter: Payer: Self-pay | Admitting: Psychiatry

## 2022-06-30 ENCOUNTER — Other Ambulatory Visit (HOSPITAL_BASED_OUTPATIENT_CLINIC_OR_DEPARTMENT_OTHER): Payer: Self-pay

## 2022-06-30 ENCOUNTER — Telehealth (INDEPENDENT_AMBULATORY_CARE_PROVIDER_SITE_OTHER): Payer: 59 | Admitting: Psychiatry

## 2022-06-30 DIAGNOSIS — F411 Generalized anxiety disorder: Secondary | ICD-10-CM | POA: Diagnosis not present

## 2022-06-30 DIAGNOSIS — F3132 Bipolar disorder, current episode depressed, moderate: Secondary | ICD-10-CM

## 2022-06-30 DIAGNOSIS — D649 Anemia, unspecified: Secondary | ICD-10-CM | POA: Diagnosis not present

## 2022-06-30 MED ORDER — FLUOXETINE HCL 40 MG PO CAPS
40.0000 mg | ORAL_CAPSULE | Freq: Every day | ORAL | 1 refills | Status: DC
  Filled 2022-06-30: qty 30, 30d supply, fill #0
  Filled 2022-08-16: qty 30, 30d supply, fill #1

## 2022-06-30 MED ORDER — CLONAZEPAM 0.5 MG PO TABS
0.5000 mg | ORAL_TABLET | Freq: Two times a day (BID) | ORAL | 1 refills | Status: DC | PRN
  Filled 2022-06-30: qty 60, 30d supply, fill #0
  Filled 2022-08-16: qty 60, 30d supply, fill #1

## 2022-06-30 MED ORDER — LAMOTRIGINE 200 MG PO TABS
200.0000 mg | ORAL_TABLET | Freq: Every day | ORAL | 4 refills | Status: DC
  Filled 2022-06-30: qty 30, 30d supply, fill #0
  Filled 2022-08-16: qty 30, 30d supply, fill #1
  Filled 2022-09-08: qty 30, 30d supply, fill #2
  Filled 2022-10-06: qty 30, 30d supply, fill #3
  Filled 2022-11-06: qty 30, 30d supply, fill #4

## 2022-06-30 NOTE — Patient Instructions (Signed)
Continue lamotrigine 200 mg daily  Continue Latuda 80 mg daily  Increase fluoxetine 40 mg daily  Continue Depakote 1000 mg at night  Obtain labs- (CMP VPA, CBC, iron panels) She will forward EKG result to the clinic   Continue clonazepam 0.5 mg twice a day as needed for anxiety, twice a week Continue hydroxyzine 25 mg daily as needed for anxiety  Next appointment: 4/3 at 3 PM

## 2022-07-07 ENCOUNTER — Other Ambulatory Visit (HOSPITAL_BASED_OUTPATIENT_CLINIC_OR_DEPARTMENT_OTHER): Payer: Self-pay

## 2022-07-14 ENCOUNTER — Other Ambulatory Visit (HOSPITAL_BASED_OUTPATIENT_CLINIC_OR_DEPARTMENT_OTHER): Payer: Self-pay

## 2022-07-14 ENCOUNTER — Other Ambulatory Visit: Payer: Self-pay | Admitting: Gastroenterology

## 2022-07-14 DIAGNOSIS — K2289 Other specified disease of esophagus: Secondary | ICD-10-CM

## 2022-07-14 MED ORDER — OMEPRAZOLE 20 MG PO CPDR
20.0000 mg | DELAYED_RELEASE_CAPSULE | Freq: Two times a day (BID) | ORAL | 1 refills | Status: AC
  Filled 2022-07-14: qty 60, 30d supply, fill #0
  Filled 2022-08-16: qty 60, 30d supply, fill #1
  Filled 2022-09-12: qty 60, 30d supply, fill #2

## 2022-07-14 MED ORDER — PANTOPRAZOLE SODIUM 40 MG PO TBEC
40.0000 mg | DELAYED_RELEASE_TABLET | Freq: Two times a day (BID) | ORAL | 0 refills | Status: DC
  Filled 2022-07-14: qty 60, 30d supply, fill #0
  Filled 2022-08-16: qty 60, 30d supply, fill #1
  Filled 2022-09-12: qty 60, 30d supply, fill #2

## 2022-07-24 ENCOUNTER — Other Ambulatory Visit (HOSPITAL_BASED_OUTPATIENT_CLINIC_OR_DEPARTMENT_OTHER): Payer: Self-pay

## 2022-07-24 ENCOUNTER — Other Ambulatory Visit: Payer: Self-pay

## 2022-07-24 MED ORDER — AMPHETAMINE-DEXTROAMPHET ER 25 MG PO CP24
25.0000 mg | ORAL_CAPSULE | Freq: Every morning | ORAL | 0 refills | Status: DC
  Filled 2022-07-24: qty 30, 30d supply, fill #0

## 2022-08-16 ENCOUNTER — Other Ambulatory Visit (HOSPITAL_BASED_OUTPATIENT_CLINIC_OR_DEPARTMENT_OTHER): Payer: Self-pay

## 2022-08-16 ENCOUNTER — Other Ambulatory Visit: Payer: Self-pay | Admitting: Psychiatry

## 2022-08-16 ENCOUNTER — Telehealth: Payer: 59 | Admitting: Psychiatry

## 2022-08-16 MED ORDER — AMPHETAMINE-DEXTROAMPHET ER 25 MG PO CP24
25.0000 mg | ORAL_CAPSULE | Freq: Every morning | ORAL | 0 refills | Status: DC
  Filled 2022-08-16 – 2022-09-05 (×2): qty 30, 30d supply, fill #0

## 2022-08-17 ENCOUNTER — Other Ambulatory Visit (HOSPITAL_BASED_OUTPATIENT_CLINIC_OR_DEPARTMENT_OTHER): Payer: Self-pay

## 2022-09-05 ENCOUNTER — Other Ambulatory Visit: Payer: Self-pay | Admitting: Psychiatry

## 2022-09-05 ENCOUNTER — Other Ambulatory Visit: Payer: Self-pay

## 2022-09-05 ENCOUNTER — Other Ambulatory Visit (HOSPITAL_BASED_OUTPATIENT_CLINIC_OR_DEPARTMENT_OTHER): Payer: Self-pay

## 2022-09-05 MED ORDER — HYDROXYZINE HCL 25 MG PO TABS
25.0000 mg | ORAL_TABLET | Freq: Every day | ORAL | 2 refills | Status: AC | PRN
  Filled 2022-09-05: qty 30, 30d supply, fill #0

## 2022-09-06 NOTE — Progress Notes (Signed)
Virtual Visit via Video Note  I connected with Ashley Franklin on 09/08/22 at  9:00 AM EDT by a video enabled telemedicine application and verified that I am speaking with the correct person using two identifiers.  Location: Patient: home Provider: office Persons participated in the visit- patient, provider    I discussed the limitations of evaluation and management by telemedicine and the availability of in person appointments. The patient expressed understanding and agreed to proceed.   I discussed the assessment and treatment plan with the patient. The patient was provided an opportunity to ask questions and all were answered. The patient agreed with the plan and demonstrated an understanding of the instructions.   The patient was advised to call back or seek an in-person evaluation if the symptoms worsen or if the condition fails to improve as anticipated.  I provided 27 minutes of non-face-to-face time during this encounter.   [name], MD     [facility] MD/PA/NP OP Progress Note  09/08/2022 10:04 AM Ashley Franklin  MRN:  [id number]  Chief Complaint:  Chief Complaint  Patient presents with   Follow-up   HPI:  This is a follow-up appointment for bipolar II disorder and anxiety.  She states that she has been feeling awful.  She has lost her job within the week since the last visit.  There was no warning.  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  It was effective immediately.  She states that she has been failing out of others pictures.  She does not have a role as a wife or mother.  She feels nobody needs her.  Her father has been supporting him financially.  She states that it hurts him so much.  Although she has passive SI, her father is the reason she is here.  She talks about her sister, who has not visited her despite telling her to do so.  She has limited contact with her son.  She feels misery.  She will lose  insurance at the end of May.  Although she is applying for Medicaid, she is not sure if she is able to get.  It is difficult for her to describe sleep, although she denies any decreased need for sleep.  She denies AH, VH.  She states that she has snapped reaction as if kids are there when there are some noises.  She denies euphonia, increased goal directed activity.  She denies alcohol use or drug use.  She has been working taking medication consistently.  She did not do labs as she has significant amount of bill at [facility], and she was concerned whether the medication to be continued. She agrees with the plan as below.   Visit Diagnosis:    ICD-10-CM   1. Bipolar affective disorder, currently depressed, moderate (HCC)  F31.32     2. Anxiety state  F41.1       Past Psychiatric History: Please see initial evaluation for full details. I have reviewed the history. No updates at this time.     Past Medical History:  Past Medical History:  Diagnosis Date   Anemia    Anxiety    Bipolar affective disorder (HCC)    Blood transfusion without reported diagnosis    Bronchospasm with bronchitis, acute    Depression    Diabetes (HCC)    diet controlled   Kidney stone    Obesity    Right carpal tunnel syndrome  12/09/2018   SBO (small bowel obstruction) (HCC) 01/2014   Sleep apnea    uses CPAP    Past Surgical History:  Procedure Laterality Date   CARPAL TUNNEL RELEASE Right 08/05/2019   Procedure: RIGHT CARPAL TUNNEL RELEASE;  Surgeon: Cindee Salt, MD;  Location: [facility];  Service: Orthopedics;  Laterality: Right;  IV REGIONAL FOREARM BLOCK   CESAREAN SECTION  1994   ENDOMETRIAL ABLATION     GASTRIC BYPASS  2001   KIDNEY STONE SURGERY  2010   SBO  2015    Family Psychiatric History: Please see initial evaluation for full details. I have reviewed the history. No updates at this time.     Family History:  Family History  Problem Relation Age of Onset   Diabetes  Mother    Cancer Mother 55       Throat    Bipolar disorder Mother    Kidney disease Mother    Heart disease Father    Bipolar disorder Sister    Bipolar disorder Sister    Bipolar disorder Brother    Drug abuse Brother    Colon cancer Maternal Aunt    Kidney disease Maternal Grandmother    Heart disease Paternal Grandfather    Cancer - Prostate Neg Hx    Breast cancer Neg Hx    Esophageal cancer Neg Hx    Rectal cancer Neg Hx    Stomach cancer Neg Hx     Social History:  Social History   Socioeconomic History   Marital status: Married    Spouse name: Not on file   Number of children: 2   Years of education: Not on file   Highest education level: Not on file  Occupational History   Occupation: office administor  Tobacco Use   Smoking status: Former    Packs/day: 0.25    Years: 6.00    Additional pack years: 0.00    Total pack years: 1.50    Types: Cigarettes   Smokeless tobacco: Never   Tobacco comments:    1/2 ppd   Vaping Use   Vaping Use: Never used  Substance and Sexual Activity   Alcohol use: Yes    Alcohol/week: 0.0 standard drinks of alcohol    Comment: Two times a week.    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, music teacher   Right handed    Caffeine 2 cups daily    Social Determinants of Health   Financial Resource Strain: Not on file  Food Insecurity: Not on file  Transportation Needs: Not on file  Physical Activity: Not on file  Stress: Not on file  Social Connections: Not on file    Allergies:  Allergies  Allergen Reactions   Nsaids Other (See Comments)    Contraindicated with gastric bypass     Metabolic Disorder Labs: Lab Results  Component Value Date   HGBA1C 4.9 02/06/2017   MPG 105 02/26/2015   No results found for: "PROLACTIN" Lab Results  Component Value Date   CHOL 165 02/06/2017  TRIG 87 02/06/2017   HDL 54 02/06/2017   LDLCALC 94 02/06/2017   Lab Results  Component Value Date   TSH 1.12 07/10/2017   TSH 1.22 06/08/2016    Therapeutic Level Labs: No results found for: "LITHIUM" Lab Results  Component Value Date   VALPROATE 67 03/27/2022   VALPROATE 62 11/29/2020   No results found for: "CBMZ"  Current Medications: Current Outpatient Medications  Medication Sig Dispense Refill   lamoTRIgine (LAMICTAL) 25 MG tablet Take 1 tablet (25 mg total) by mouth every other day for 7 days, THEN 1 tablet (25 mg total) every other day for 21 days. Start after discontinuation of depakote. 30 tablet 0   amphetamine-dextroamphetamine (ADDERALL XR) 25 MG 24 hr capsule Take 1 capsule by mouth every morning. 30 capsule 0   Armodafinil 250 MG tablet Take 1 tablet (250 mg total) by mouth daily 30 tablet 5   Armodafinil 250 MG tablet Take 1 tablet (250 mg total) by mouth daily. 30 tablet 5   [START ON 09/15/2022] clonazePAM (KLONOPIN) 0.5 MG tablet Take 1 tablet (0.5 mg total) by mouth 2 (two) times daily as needed for anxiety. 60 tablet 0   [START ON 09/15/2022] FLUoxetine (PROZAC) 40 MG capsule Take 1 capsule (40 mg total) by mouth daily. 30 capsule 1   hydrOXYzine (ATARAX) 25 MG tablet Take 1 tablet (25 mg total) by mouth daily as needed for anxiety. 30 tablet 2   lamoTRIgine (LAMICTAL) 200 MG tablet Take 1 tablet (200 mg total) by mouth daily. 30 tablet 4   lubiprostone (AMITIZA) 24 MCG capsule Take 1 capsule (24 mcg total) by mouth 2 (two) times daily. 120 capsule 3   lurasidone (LATUDA) 80 MG TABS tablet Take 1 tablet (80 mg total) by mouth daily with breakfast. 30 tablet 2   omeprazole (PRILOSEC) 20 MG capsule Take 1 capsule (20 mg total) by mouth 2 (two) times daily  before a meal. Best to take on an empty stomach. 90 capsule 1   pantoprazole (PROTONIX) 40 MG tablet Take 1 tablet (40 mg total) by mouth 2 (two) times daily. 180 tablet 0   No current facility-administered medications for this visit.     Musculoskeletal: Strength & Muscle Tone:  N/A Gait & Station:  N/A Patient leans: N/A  Psychiatric Specialty Exam: Review of Systems  Psychiatric/Behavioral:  Positive for decreased concentration, dysphoric mood, sleep disturbance and suicidal ideas. Negative for agitation, behavioral problems, confusion, hallucinations and self-injury. The patient is nervous/anxious. The patient is not hyperactive.   All other systems reviewed and are negative.   There were no vitals taken for this visit.There is no height or weight on file to calculate BMI.  General Appearance: Fairly Groomed  Eye Contact:  Good  Speech:  Clear and Coherent  Volume:  Normal  Mood:  Depressed  Affect:  Appropriate, Congruent, and Tearful  Thought Process:  Coherent  Orientation:  Full (Time, Place, and Person)  Thought Content: Logical   Suicidal Thoughts:  Yes.  without intent/plan  Homicidal Thoughts:  No  Memory:  Immediate;   Good  Judgement:  Good  Insight:  Good  Psychomotor Activity:  Normal  Concentration:  Concentration: Good and Attention Span: Good  Recall:  Good  Fund of Knowledge: Good  Language: Good  Akathisia:  No  Handed:  Right  AIMS (if indicated): not done  Assets:  Communication Skills Desire for Improvement  ADL's:  Intact  Cognition: WNL  Sleep:  Poor   Screenings:  PHQ2-9    Flowsheet Row Office Visit from 11/08/2021 in [facility] Video Visit from 06/03/2021 in [facility] Video Visit from 02/25/2021 in [facility] Office Visit from 11/29/2020 in [facility] Video Visit from 08/31/2020  in [facility]  PHQ-2 Total Score 4 0 0 1 0  PHQ-9 Total Score 15 -- -- -- --      Flowsheet Row Office Visit from 11/08/2021 in [facility] ED from 08/11/2021 in [facility] Video Visit from 02/25/2021 in [facility]  C-SSRS RISK CATEGORY No Risk No Risk No Risk        Assessment and Plan:  Ashley Franklin is a 52 y.o. year old female with a history of bipolar disorder, anxiety, anemia, obesity s/p gastric bypass surgery, who presents for follow up appointment for below.   1. Bipolar affective disorder, currently depressed, moderate (HCC) 2. Anxiety state Acute stressors include: termination at work, loneliness Other stressors include: separation, school work, her mother with cancer in spine    History:   There has been significant worsening in depressive symptoms since the last visit.  Given the concern of her losing insurance, and her not being able to obtain labs due to financial strain, will taper off Depakote, and try uptitration of lamotrigine.  Discussed potential risk of Stevens-Johnson syndrome.  We uptitrate slowly given she has been on Depakote.  Will continue fluoxetine to target depression and anxiety.  Will continue lamotrigine for bipolar disorder.  Will continue clonazepam as needed and hydroxyzine as needed for anxiety.   1. Anemia, unspecified type There was worsening in microcytic anemia, which was not evident in the previous year. She has history of gastric bypass surgery, but has not been any iron supplements for the past few years. According to the chart review, she did upper GI/lower GI with no obvious findings to explain this anemia.She does not have menstrual cycle. Despite several recommendations to obtain labs and follow up with her PCP, she hasn't been able to do so. The financial strain has now compounded her  challenges.  # SI  Although she reports hopelessness, loneliness along with mood symptoms described above and passive SI, she adamantly denies any plan or intent.  She denies gun access at home.  She is future oriented, and is agreeable to contact emergency resources if any worsening. She is appropriate for outpatient follow up. Will have sooner follow up.    Plan:  Medication Change Plan: Week 1: Reduce Depakote to 500 mg daily. Continue Lamotrigine at 200 mg daily. Week 2: Discontinue Depakote. Increase Lamotrigine to 200 mg daily and 25 mg every other night. Week 3: Increase Lamotrigine to 200 mg daily and 25 mg at night. Continue this dose thereafter.  Continue Latuda 80 mg daily - akathisia from 100 mg.  Continue fluoxetine 40 mg daily  Continue clonazepam 0.5 mg twice a day as needed for anxiety, twice a week Continue hydroxyzine 25 mg daily as needed for anxiety - rarely takes it Next appointment: 5/10 at 11 am for 30 mins, video [email or web address] - She will forward EKG result to the clinic  QTc 464 msec 09/2020 - consider obtaining CBC, ferritin, vitamin D when feasible - on Adderall XR 25 mg daily, armodafinil for narcolepsy - was on Depakote (was on 2000 mg), fluoxetine (was on 60  mg daily) with good benefit along with lamotrigine  Past trials of medication: Lexapro, Paxil, amitriptyline, Abilify (irritable), oxcarbazepine, perphenazine, Trifluoperazine, Klonopin, Valium. Vistaril, Ambien    The patient demonstrates the following  risk factors for suicide: Chronic risk factors for suicide include psychiatric disorder /bipolar disorder, previous self-harm of scratching herself. Acute risk factors for suicide include none. Protective factors for this patient include positive social support, positive therapeutic relationship, hope for the future. Considering these factors, the overall suicide risk at this point appears to be moderate, but not imminent. Patient  denies gun access. Discussed in detail safety plan that anytime having active suicidal thoughts or homicidal thoughts and she need to call 911 or go to local emergency room.      Collaboration of Care: Collaboration of Care: Other reviewed notes in Epic  Patient/Guardian was advised Release of Information must be obtained prior to any record release in order to collaborate their care with an outside provider. Patient/Guardian was advised if they have not already done so to contact the registration department to sign all necessary forms in order for [place] to release information regarding their care.   Consent: Patient/Guardian gives verbal consent for treatment and assignment of benefits for services provided during this visit. Patient/Guardian expressed understanding and agreed to proceed.    [name], MD 09/08/2022, 10:04 AM

## 2022-09-08 ENCOUNTER — Telehealth (INDEPENDENT_AMBULATORY_CARE_PROVIDER_SITE_OTHER): Payer: 59 | Admitting: Psychiatry

## 2022-09-08 ENCOUNTER — Other Ambulatory Visit (HOSPITAL_BASED_OUTPATIENT_CLINIC_OR_DEPARTMENT_OTHER): Payer: Self-pay

## 2022-09-08 ENCOUNTER — Encounter: Payer: Self-pay | Admitting: Psychiatry

## 2022-09-08 ENCOUNTER — Other Ambulatory Visit: Payer: Self-pay

## 2022-09-08 DIAGNOSIS — F411 Generalized anxiety disorder: Secondary | ICD-10-CM

## 2022-09-08 DIAGNOSIS — F3132 Bipolar disorder, current episode depressed, moderate: Secondary | ICD-10-CM

## 2022-09-08 MED ORDER — CLONAZEPAM 0.5 MG PO TABS
0.5000 mg | ORAL_TABLET | Freq: Two times a day (BID) | ORAL | 0 refills | Status: DC | PRN
  Filled 2022-09-08 – 2022-10-04 (×2): qty 60, 30d supply, fill #0

## 2022-09-08 MED ORDER — LURASIDONE HCL 80 MG PO TABS
80.0000 mg | ORAL_TABLET | Freq: Every day | ORAL | 2 refills | Status: DC
  Filled 2022-09-08: qty 30, 30d supply, fill #0
  Filled 2022-10-16: qty 30, 30d supply, fill #1
  Filled 2022-11-14: qty 30, 30d supply, fill #2

## 2022-09-08 MED ORDER — LAMOTRIGINE 25 MG PO TABS
ORAL_TABLET | ORAL | 0 refills | Status: DC
  Filled 2022-09-08: qty 30, 28d supply, fill #0

## 2022-09-08 MED ORDER — FLUOXETINE HCL 40 MG PO CAPS
40.0000 mg | ORAL_CAPSULE | Freq: Every day | ORAL | 1 refills | Status: DC
  Filled 2022-09-08: qty 30, 30d supply, fill #0
  Filled 2022-10-06: qty 30, 30d supply, fill #1

## 2022-09-08 NOTE — Patient Instructions (Signed)
Medication Change Plan: Week 1: Reduce Depakote to 500 mg daily. Continue Lamotrigine at 200 mg daily. Week 2: Discontinue Depakote. Increase Lamotrigine to 200 mg daily and 25 mg every other night. Week 3: Increase Lamotrigine to 200 mg daily and 25 mg at night. Continue this dose thereafter.  Continue Latuda 80 mg daily - akathisia from 100 mg.  Continue fluoxetine 40 mg daily  Continue clonazepam 0.5 mg twice a day as needed for anxiety, twice a week Continue hydroxyzine 25 mg daily as needed for anxiety - rarely takes it Next appointment: 5/10 at 11 am

## 2022-09-21 NOTE — Progress Notes (Signed)
Virtual Visit via Video Note  I connected with Ashley Franklin on 09/22/22 at 11:00 AM EDT by a video enabled telemedicine application and verified that I am speaking with the correct person using two identifiers.  Location: Patient: home Provider: office Persons participated in the visit- patient, provider    I discussed the limitations of evaluation and management by telemedicine and the availability of in person appointments. The patient expressed understanding and agreed to proceed.    I discussed the assessment and treatment plan with the patient. The patient was provided an opportunity to ask questions and all were answered. The patient agreed with the plan and demonstrated an understanding of the instructions.   The patient was advised to call back or seek an in-person evaluation if the symptoms worsen or if the condition fails to improve as anticipated.  I provided 20 minutes of non-face-to-face time during this encounter.   Ashley Hotter, MD    [facility] MD/PA/NP OP Progress Note  09/22/2022 11:49 AM NYAMAL Franklin  MRN:  [id number]  Chief Complaint:  Chief Complaint  Patient presents with   Follow-up   HPI:  This is a follow-up appointment for bipolar disorder and anxiety.  She states that she graduated with honors.  Her parents, and her son visit.  It was excellent, although her son had to go back soon.  She also received messages from her sisters.  She agrees that she was in their picture, although she previously commented that she was fading.  She is doing job Architect.  She has not heard back despite applying for 50 companies. Her parents has been supportive.  This is the first time she was unable to support herself.  She is hoping to get a job in relation to business administration.  She started planting flowers, and she enjoys this.  She sleeps up to 6 hours.  Although she still has moments, her depression is not as bad compared to before.  She denies SI.  She was able to  taper off Depakote and start lamotrigine without side effect.  She denies decreased need for sleep, euphoria.  She denies hallucinations.  She denies alcohol use or drug use.  She agrees with the plan as being all.    Visit Diagnosis:    ICD-10-CM   1. Bipolar affective disorder, currently depressed, moderate (HCC)  F31.32     2. Anxiety state  F41.1       Past Psychiatric History: Please see initial evaluation for full details. I have reviewed the history. No updates at this time.     Past Medical History:  Past Medical History:  Diagnosis Date   Anemia    Anxiety    Bipolar affective disorder (HCC)    Blood transfusion without reported diagnosis    Bronchospasm with bronchitis, acute    Depression    Diabetes (HCC)    diet controlled   Kidney stone    Obesity    Right carpal tunnel syndrome 12/09/2018   SBO (small bowel obstruction) (HCC) 01/2014   Sleep apnea    uses CPAP    Past Surgical History:  Procedure Laterality Date   CARPAL TUNNEL RELEASE Right 08/05/2019   Procedure: RIGHT CARPAL TUNNEL RELEASE;  Surgeon: Cindee Salt, MD;  Location: [facility];  Service: Orthopedics;  Laterality: Right;  IV REGIONAL FOREARM BLOCK   CESAREAN SECTION  1994   ENDOMETRIAL ABLATION     GASTRIC BYPASS  2001   KIDNEY STONE SURGERY  2010   SBO  2015    Family Psychiatric History: Please see initial evaluation for full details. I have reviewed the history. No updates at this time.     Family History:  Family History  Problem Relation Age of Onset   Diabetes Mother    Cancer Mother 82       Throat    Bipolar disorder Mother    Kidney disease Mother    Heart disease Father    Bipolar disorder Sister    Bipolar disorder Sister    Bipolar disorder Brother    Drug abuse Brother    Colon cancer Maternal Aunt    Kidney disease Maternal Grandmother    Heart disease Paternal Grandfather    Cancer - Prostate Neg Hx    Breast cancer Neg Hx    Esophageal cancer  Neg Hx    Rectal cancer Neg Hx    Stomach cancer Neg Hx     Social History:  Social History   Socioeconomic History   Marital status: Married    Spouse name: Not on file   Number of children: 2   Years of education: Not on file   Highest education level: Not on file  Occupational History   Occupation: office administor  Tobacco Use   Smoking status: Former    Packs/day: 0.25    Years: 6.00    Additional pack years: 0.00    Total pack years: 1.50    Types: Cigarettes   Smokeless tobacco: Never   Tobacco comments:    1/2 ppd   Vaping Use   Vaping Use: Never used  Substance and Sexual Activity   Alcohol use: Yes    Alcohol/week: 0.0 standard drinks of alcohol    Comment: Two times a week.    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, music teacher   Right handed    Caffeine 2 cups daily    Social Determinants of Health   Financial Resource Strain: Not on file  Food Insecurity: Not on file  Transportation Needs: Not on file  Physical Activity: Not on file  Stress: Not on file  Social Connections: Not on file    Allergies:  Allergies  Allergen Reactions   Nsaids Other (See Comments)    Contraindicated with gastric bypass    Metabolic Disorder Labs: Lab Results  Component Value Date   HGBA1C 4.9 02/06/2017   MPG 105 02/26/2015   No results found for: "PROLACTIN" Lab Results  Component Value Date   CHOL 165 02/06/2017   TRIG 87 02/06/2017   HDL 54 02/06/2017   LDLCALC 94 02/06/2017   Lab Results  Component Value Date   TSH 1.12 07/10/2017   TSH 1.22 06/08/2016    Therapeutic Level Labs: No results found  for: "LITHIUM" Lab Results  Component Value Date   VALPROATE 67 03/27/2022   VALPROATE 62 11/29/2020   No results found for: "CBMZ"  Current Medications: Current Outpatient Medications  Medication Sig Dispense Refill   amphetamine-dextroamphetamine (ADDERALL XR) 25 MG 24 hr capsule Take  1 capsule by mouth every morning. 30 capsule 0   Armodafinil 250 MG tablet Take 1 tablet (250 mg total) by mouth daily 30 tablet 5   Armodafinil 250 MG tablet Take 1 tablet (250 mg total) by mouth daily. 30 tablet 5   clonazePAM (KLONOPIN) 0.5 MG tablet Take 1 tablet (0.5 mg total) by mouth 2 (two) times daily as needed for anxiety. 60 tablet 0   FLUoxetine (PROZAC) 40 MG capsule Take 1 capsule (40 mg total) by mouth daily. 30 capsule 1   hydrOXYzine (ATARAX) 25 MG tablet Take 1 tablet (25 mg total) by mouth daily as needed for anxiety. 30 tablet 2   lamoTRIgine (LAMICTAL) 200 MG tablet Take 1 tablet (200 mg total) by mouth daily. 30 tablet 4   lamoTRIgine (LAMICTAL) 25 MG tablet Take 1 tablet (25 mg total) by mouth every other day for 7 days, THEN 1 tablet (25 mg total) every other day for 21 days. Start after discontinuation of depakote. 30 tablet 0   lubiprostone (AMITIZA) 24 MCG capsule Take 1 capsule (24 mcg total) by mouth 2 (two) times daily. 120 capsule 3   lurasidone (LATUDA) 80 MG TABS tablet Take 1 tablet (80 mg total) by mouth daily with breakfast. 30 tablet 2   omeprazole (PRILOSEC) 20 MG capsule Take 1 capsule (20 mg total) by mouth 2 (two) times daily before a meal. Best to take on an empty stomach. 90 capsule 1   pantoprazole (PROTONIX) 40 MG tablet Take 1 tablet (40 mg total) by mouth 2 (two) times daily. 180 tablet 0   No current facility-administered medications for this visit.     Musculoskeletal: Strength & Muscle Tone:  N/A Gait & Station:  N/A Patient leans: N/A  Psychiatric Specialty Exam: Review of Systems  Psychiatric/Behavioral:  Positive for dysphoric mood. Negative for  agitation, behavioral problems, confusion, decreased concentration, hallucinations, self-injury, sleep disturbance and suicidal ideas. The patient is nervous/anxious. The patient is not hyperactive.   All other systems reviewed and are negative.   There were no vitals taken for this visit.There is no height or weight on file to calculate BMI.  General Appearance: Fairly Groomed  Eye Contact:  Good  Speech:  Clear and Coherent  Volume:  Normal  Mood:   not as bad as it was  Affect:  Appropriate, Congruent, and calmer  Thought Process:  Coherent  Orientation:  Full (Time, Place, and Person)  Thought Content: Logical   Suicidal Thoughts:  No  Homicidal Thoughts:  No  Memory:  Immediate;   Good  Judgement:  Good  Insight:  Good  Psychomotor Activity:  Normal  Concentration:  Concentration: Good and Attention Span: Good  Recall:  Good  Fund of Knowledge: Good  Language: Good  Akathisia:  No  Handed:  Right  AIMS (if indicated): not done  Assets:  Communication Skills Desire for Improvement  ADL's:  Intact  Cognition: WNL  Sleep:  Fair   Screenings: Equities trader Office Visit from 11/08/2021 in [facility] Video Visit from 06/03/2021 in [facility] Video Visit from 02[facility]25/2021 in [facility] Office Visit from 11/29/2020 in [facility] Video Visit from 08/31/2020 in [facility]  PHQ-2 Total Score 4 0 0 1 0  PHQ-9 Total Score 15 -- -- -- --      Flowsheet Row Office Visit from 11/08/2021 in  [facility] ED from 08/11/2021 in [facility] Video Visit from 02/25/2021 in [facility]  C-SSRS RISK CATEGORY No Risk No Risk No Risk        Assessment and Plan:  Ashley Franklin is a 52 y.o. year old female with a history of bipolar disorder, anxiety, anemia, obesity s/p gastric bypass surgery, who presents for follow up appointment for below.   1. Bipolar affective disorder, currently depressed, moderate (HCC) 2. Anxiety state Acute stressors include: termination at work, loneliness Other stressors include: separation,  her mother with cancer in spine    History:Tx from Dr. Lolly Mustache. History of  impulsive sexually inappropriate behavior, irritability, anger, grandiosity when her husband left in 2010 Admitted in 2015 for depression. Originally on stelazine, trileptal 300 mg TID, fluoxetine 60 mg, amitriptyline 25 mg TID, valium 5 mg TID, Ambien 10 mg qhs Exam is notable for brighter affect, and she reports slight improvement in her mood symptoms since last visit, which coincided with medication adjustment, and recent graduation from school.  Will plan to uptitrate lamotrigine after reviewing EKG. discussed potential risk of Stevens-Johnson syndrome.  Will continue lamotrigine for bipolar depression , and fluoxetine to target depression and anxiety.  Will continue clonazepam, hydroxyzine as needed for anxiety.   1. Anemia, unspecified type There was worsening in microcytic anemia, which was not evident in the previous year. She has history of gastric bypass surgery, but has not been any iron supplements for the past few years. According to the chart review, she did upper GI/lower GI with no obvious findings to explain this anemia.She does not have menstrual cycle. Despite several recommendations to obtain labs and follow up with her PCP, she hasn't been able to do so. The financial strain has now compounded her challenges.    Plan:  She will forward [place] the EKG result  If QTc is within normal limit, will plan to uptitrate lamotrigine 200 mg daily, and 50 mg at night (uptitrate from 200 mg daily, 25 mg at night ) Continue Latuda 80 mg daily - akathisia from 100 mg.  Continue  fluoxetine 40 mg daily  Continue clonazepam 0.5 mg twice a day as needed for anxiety, twice a week Continue hydroxyzine 25 mg daily as needed for anxiety - rarely takes it Next appointment: 5/28 at 3 pm for 30 mins, video [email or web address] - She will forward EKG result to the clinic  QTc 464 msec 09/2020 - consider obtaining CBC, ferritin, vitamin D when feasible - on Adderall XR 25 mg daily, armodafinil for narcolepsy   Past trials of medication: Lexapro, Paxil, amitriptyline, Abilify (irritable), oxcarbazepine, perphenazine, Trifluoperazine, Klonopin, Valium. Vistaril, Ambien    The patient demonstrates the following  risk factors for suicide: Chronic risk factors for suicide include psychiatric disorder /bipolar disorder, previous self-harm of scratching herself. Acute risk factors for suicide include none. Protective factors for this patient include positive social support, positive therapeutic relationship, hope for the future. Considering these factors, the overall suicide risk at this point appears to be moderate, but not imminent. Patient denies gun access. Discussed in detail safety plan that anytime having active suicidal thoughts or homicidal thoughts and she need to call 911 or go to local emergency room.         Collaboration of Care: Collaboration of Care: Other reviewed notes in Epic  Patient/Guardian was advised Release of Information must be obtained prior to any record release in order  to collaborate their care with an outside provider. Patient/Guardian was advised if they have not already done so to contact the registration department to sign all necessary forms in order for [place] to release information regarding their care.   Consent: Patient/Guardian gives verbal consent for treatment and assignment of benefits for services provided during this visit. Patient/Guardian expressed understanding and agreed to proceed.    Ashley Hotter, MD 09/22/2022, 11:49 AM

## 2022-09-22 ENCOUNTER — Encounter: Payer: Self-pay | Admitting: Psychiatry

## 2022-09-22 ENCOUNTER — Telehealth (INDEPENDENT_AMBULATORY_CARE_PROVIDER_SITE_OTHER): Payer: 59 | Admitting: Psychiatry

## 2022-09-22 DIAGNOSIS — F3132 Bipolar disorder, current episode depressed, moderate: Secondary | ICD-10-CM

## 2022-09-22 DIAGNOSIS — F411 Generalized anxiety disorder: Secondary | ICD-10-CM | POA: Diagnosis not present

## 2022-10-02 ENCOUNTER — Other Ambulatory Visit: Payer: Self-pay | Admitting: Psychiatry

## 2022-10-04 ENCOUNTER — Other Ambulatory Visit (HOSPITAL_BASED_OUTPATIENT_CLINIC_OR_DEPARTMENT_OTHER): Payer: Self-pay

## 2022-10-04 MED ORDER — AMPHETAMINE-DEXTROAMPHET ER 25 MG PO CP24
25.0000 mg | ORAL_CAPSULE | Freq: Every morning | ORAL | 0 refills | Status: DC
  Filled 2022-10-04: qty 30, 30d supply, fill #0

## 2022-10-05 ENCOUNTER — Other Ambulatory Visit (HOSPITAL_BASED_OUTPATIENT_CLINIC_OR_DEPARTMENT_OTHER): Payer: Self-pay

## 2022-10-06 ENCOUNTER — Other Ambulatory Visit: Payer: Self-pay

## 2022-10-06 ENCOUNTER — Other Ambulatory Visit (HOSPITAL_BASED_OUTPATIENT_CLINIC_OR_DEPARTMENT_OTHER): Payer: Self-pay

## 2022-10-07 NOTE — Progress Notes (Unsigned)
Virtual Visit via Video Note  I connected with Ashley Franklin on 10/10/22 at  3:00 PM EDT by a video enabled telemedicine application and verified that I am speaking with the correct person using two identifiers.  Location: Patient: home Provider: office Persons participated in the visit- patient, provider    I discussed the limitations of evaluation and management by telemedicine and the availability of in person appointments. The patient expressed understanding and agreed to proceed.    I discussed the assessment and treatment plan with the patient. The patient was provided an opportunity to ask questions and all were answered. The patient agreed with the plan and demonstrated an understanding of the instructions.   The patient was advised to call back or seek an in-person evaluation if the symptoms worsen or if the condition fails to improve as anticipated.  I provided 15 minutes of non-face-to-face time during this encounter.   [name], MD    [facility] MD/PA/NP OP Progress Note  10/10/2022 3:39 PM Ashley Franklin  MRN:  [id number]  Chief Complaint:  Chief Complaint  Patient presents with   Follow-up   HPI:  This is a follow-up appointment for bipolar disorder and anxiety.  She states that she has been dealing with the family issues.  Her brother had MVA, and he was transferred to [facility]. He will have a surgery.  He was found to have substance use.  Her son had seizure due to alcohol withdrawal.  She feels helpless about these.  She feels more and more defeated.  She does not have the best to give to them, and she will not be able to fix them.  Discussed self compassion while evaluating her feeling.  She struggles with insomnia.  She is not able to distract herself at night.  She tends to have a thought of how 52 year old woman should be.  Although she has been applying for a job, she has not heard back from them.  She has been taking clonazepam regularly for anxiety given it has  worsened when she visits the hospital.  She would like to see her therapy now that she has insurance, and she asks this referral.  She is willing to get EKG. she denies SI.  She denies decreased need for sleep or euphoria.  She denies alcohol use or drug use.    Visit Diagnosis:    ICD-10-CM   1. Bipolar affective disorder, currently depressed, moderate (HCC)  F31.32 EKG 12-Lead    Ambulatory referral to Psychology    2. Anxiety state  F41.1       Past Psychiatric History: Please see initial evaluation for full details. I have reviewed the history. No updates at this time.     Past Medical History:  Past Medical History:  Diagnosis Date   Anemia    Anxiety    Bipolar affective disorder (HCC)    Blood transfusion without reported diagnosis    Bronchospasm with bronchitis, acute    Depression    Diabetes (HCC)    diet controlled   Kidney stone    Obesity    Right carpal tunnel syndrome 12/09/2018   SBO (small bowel obstruction) (HCC) 01/2014   Sleep apnea    uses CPAP    Past Surgical History:  Procedure Laterality Date   CARPAL TUNNEL RELEASE Right 08/05/2019   Procedure: RIGHT CARPAL TUNNEL RELEASE;  Surgeon: Cindee Salt, MD;  Location: [facility];  Service: Orthopedics;  Laterality: Right;  IV  REGIONAL FOREARM BLOCK   CESAREAN SECTION  1994   ENDOMETRIAL ABLATION     GASTRIC BYPASS  2001   KIDNEY STONE SURGERY  2010   SBO  2015    Family Psychiatric History: Please see initial evaluation for full details. I have reviewed the history. No updates at this time.     Family History:  Family History  Problem Relation Age of Onset   Diabetes Mother    Cancer Mother 11       Throat    Bipolar disorder Mother    Kidney disease Mother    Heart disease Father    Bipolar disorder Sister    Bipolar disorder Sister    Bipolar disorder Brother    Drug abuse Brother    Colon cancer Maternal Aunt    Kidney disease Maternal Grandmother    Heart disease  Paternal Grandfather    Cancer - Prostate Neg Hx    Breast cancer Neg Hx    Esophageal cancer Neg Hx    Rectal cancer Neg Hx    Stomach cancer Neg Hx     Social History:  Social History   Socioeconomic History   Marital status: Married    Spouse name: Not on file   Number of children: 2   Years of education: Not on file   Highest education level: Not on file  Occupational History   Occupation: office administor  Tobacco Use   Smoking status: Former    Packs/day: 0.25    Years: 6.00    Additional pack years: 0.00    Total pack years: 1.50    Types: Cigarettes   Smokeless tobacco: Never   Tobacco comments:    1/2 ppd   Vaping Use   Vaping Use: Never used  Substance and Sexual Activity   Alcohol use: Yes    Alcohol/week: 0.0 standard drinks of alcohol    Comment: Two times a week.    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, music teacher   Right handed    Caffeine 2 cups daily    Social Determinants of Health   Financial Resource Strain: Not on file  Food Insecurity: Not on file  Transportation Needs: Not on file  Physical Activity: Not on file  Stress: Not on file  Social Connections: Not on file    Allergies:  Allergies  Allergen Reactions   Nsaids Other (See Comments)    Contraindicated with gastric bypass    Metabolic Disorder Labs: Lab Results  Component Value Date   HGBA1C 4.9 02/06/2017   MPG 105 02/26/2015   No results found for: "PROLACTIN" Lab Results  Component Value Date   CHOL 165 02/06/2017   TRIG 87 02/06/2017   HDL 54 02/06/2017   LDLCALC 94 02/06/2017   Lab Results  Component Value Date    TSH 1.12 07/10/2017   TSH 1.22 06/08/2016    Therapeutic Level Labs: No results found for: "LITHIUM" Lab Results  Component Value Date   VALPROATE 67 03/27/2022   VALPROATE 62 11/29/2020  No results found for: "CBMZ"  Current Medications: Current Outpatient Medications  Medication Sig Dispense Refill   amphetamine-dextroamphetamine (ADDERALL XR) 25 MG 24 hr capsule Take 1 capsule by mouth every morning. 30 capsule 0   Armodafinil 250 MG tablet Take 1 tablet (250 mg total) by mouth daily 30 tablet 5   Armodafinil 250 MG tablet Take 1 tablet (250 mg total) by mouth daily. 30 tablet 5   [START ON 11/04/2022] clonazePAM (KLONOPIN) 0.5 MG tablet Take 1 tablet (0.5 mg total) by mouth 2 (two) times daily as needed for anxiety. 60 tablet 0   FLUoxetine (PROZAC) 40 MG capsule Take 1 capsule (40 mg total) by mouth daily. 30 capsule 1   hydrOXYzine (ATARAX) 25 MG tablet Take 1 tablet (25 mg total) by mouth daily as needed for anxiety. 30 tablet 2   lamoTRIgine (LAMICTAL) 200 MG tablet Take 1 tablet (200 mg total) by mouth daily. 30 tablet 4   lamoTRIgine (LAMICTAL) 25 MG tablet Take 1 tablet (25 mg total) by mouth daily. Start after discontinuation of depakote 30 tablet 0   lubiprostone (AMITIZA) 24 MCG capsule Take 1 capsule (24 mcg total) by mouth 2 (two) times daily. 120 capsule 3   lurasidone (LATUDA) 80 MG TABS tablet Take 1 tablet (80 mg total) by mouth daily with breakfast. 30 tablet 2   omeprazole (PRILOSEC) 20 MG capsule Take 1 capsule (20 mg total) by mouth 2 (two) times daily before a meal. Best to take on an empty stomach. 90 capsule 1   pantoprazole (PROTONIX) 40 MG tablet Take 1 tablet (40 mg total) by mouth 2 (two) times daily. 180 tablet 0   No current facility-administered medications for this visit.     Musculoskeletal: Strength & Muscle Tone:  N/A Gait & Station:  N/A Patient leans: N/A  Psychiatric Specialty Exam: Review of Systems  Psychiatric/Behavioral:   Positive for dysphoric mood and sleep disturbance. Negative for agitation, behavioral problems, confusion, decreased concentration, hallucinations, self-injury and suicidal ideas. The patient is nervous/anxious. The patient is not hyperactive.   All other systems reviewed and are negative.   There were no vitals taken for this visit.There is no height or weight on file to calculate BMI.  General Appearance: Fairly Groomed  Eye Contact:  Good  Speech:  Clear and Coherent  Volume:  Normal  Mood:   same  Affect:  Appropriate, Congruent, and Tearful  Thought Process:  Coherent  Orientation:  Full (Time, Place, and Person)  Thought Content: Logical   Suicidal Thoughts:  No  Homicidal Thoughts:  No  Memory:  Immediate;   Good  Judgement:  Good  Insight:  Good  Psychomotor Activity:  Normal  Concentration:  Concentration: Good and Attention Span: Good  Recall:  Good  Fund of Knowledge: Good  Language: Good  Akathisia:  No  Handed:  Right  AIMS (if indicated): not done  Assets:  Communication Skills Desire for Improvement  ADL's:  Intact  Cognition: WNL  Sleep:  Poor   Screenings: [facility] Office Visit from 11/08/2021 in [facility] Video Visit from 06/03/2021 in [facility] Video Visit from 02/25/2021 in [facility] Office Visit from 11/29/2020 in [facility] Video Visit from 08/31/2020 in [facility]  PHQ-2 Total Score 4 0 0 1 0  PHQ-9 Total Score 15 -- -- -- --  Flowsheet Row Office Visit from 11/08/2021 in [facility] ED from 08/11/2021 in [facility] Video Visit from 02/25/2021 in [facility]  C-SSRS RISK CATEGORY No Risk No Risk No Risk         Assessment and Plan:  Ashley Franklin is a 51 y.o. year old female with a history of bipolar disorder, anxiety, anemia, obesity s/p gastric bypass surgery, who presents for follow up appointment for below.   1. Bipolar affective disorder, currently depressed, moderate (HCC) 2. Anxiety state Acute stressors include: termination at work, loneliness, brother with substance use, who recently had MVA, her son, who had alcohol withdrawal/seizure Other stressors include: separation,  her mother with cancer in spine    History:Tx from Dr. Lolly Mustache. History of  impulsive sexually inappropriate behavior, irritability, anger, grandiosity when her husband left in 2010 Admitted in 2015 for depression. Originally on stelazine, trileptal 300 mg TID, fluoxetine 60 mg, amitriptyline 25 mg TID, valium 5 mg TID, Ambien 10 mg qhs  Exam is notable for tearfulness, although there has been overall improvement in her mood symptoms since uptitration of lamotrigine.  She agrees to obtain EKG now that she has insurance.  Will plan to uptitrate lamotrigine to optimize treatment for bipolar depression, off label use.  She is aware of the risk of Stevens-Johnson syndrome.  Will continue fluoxetine to target depression and anxiety.  Will continue clonazepam, hydroxyzine as needed for anxiety. Coached behavioral activation.   1. Anemia, unspecified type There was worsening in microcytic anemia, which was not evident in the previous year. She has history of gastric bypass surgery, but has not been any iron supplements for the past few years. According to the chart review, she did upper GI/lower GI with no obvious findings to explain this anemia.She does not have menstrual cycle. Despite several recommendations to obtain labs and follow up with her PCP, she hasn't been able to do so. The financial strain has now compounded her challenges.    Plan:  Obtain EKG If QTc is within normal limit, will plan to uptitrate lamotrigine 200  mg daily, and 50 mg at night (uptitrate from 200 mg daily, 25 mg at night ) Continue Latuda 80 mg daily - akathisia from 100 mg.  Continue fluoxetine 40 mg daily  Continue clonazepam 0.5 mg twice a day as needed for anxiety, twice a week Continue hydroxyzine 25 mg daily as needed for anxiety - rarely takes it Next appointment: 6/27 at 1 30, video [email or web address] - She will forward EKG result to the clinic  QTc 464 msec 09/2020  EKG,   - consider obtaining CBC, ferritin, vitamin D when feasible - on Adderall XR 25 mg daily, armodafinil for narcolepsy   Past trials of medication: Lexapro, Paxil, amitriptyline, Abilify (irritable), oxcarbazepine, perphenazine, Trifluoperazine, Klonopin, Valium. Vistaril, Ambien    The patient demonstrates the following  risk factors for suicide: Chronic risk factors for suicide include psychiatric disorder /bipolar disorder, previous self-harm of scratching herself. Acute risk factors for suicide include none. Protective factors for this patient include positive social support, positive therapeutic relationship, hope for the future. Considering these factors, the overall suicide risk at this point appears to be moderate, but not imminent. Patient denies gun access. Discussed in detail safety plan that anytime having active suicidal thoughts or homicidal thoughts and she need to call 911 or go to local emergency room.     Collaboration of Care: Collaboration of Care:  Other reviewed notes in Epic  Patient/Guardian was advised Release of Information must be obtained prior to any record release in order to collaborate their care with an outside provider. Patient/Guardian was advised if they have not already done so to contact the registration department to sign all necessary forms in order for [place] to release information regarding their care.   Consent: Patient/Guardian gives verbal consent for treatment and assignment of benefits for services provided  during this visit. Patient/Guardian expressed understanding and agreed to proceed.    [name], MD 10/10/2022, 3:39 PM

## 2022-10-10 ENCOUNTER — Encounter: Payer: Self-pay | Admitting: Psychiatry

## 2022-10-10 ENCOUNTER — Other Ambulatory Visit (HOSPITAL_BASED_OUTPATIENT_CLINIC_OR_DEPARTMENT_OTHER): Payer: Self-pay

## 2022-10-10 ENCOUNTER — Telehealth (INDEPENDENT_AMBULATORY_CARE_PROVIDER_SITE_OTHER): Payer: 59 | Admitting: Psychiatry

## 2022-10-10 DIAGNOSIS — F411 Generalized anxiety disorder: Secondary | ICD-10-CM

## 2022-10-10 DIAGNOSIS — F3132 Bipolar disorder, current episode depressed, moderate: Secondary | ICD-10-CM | POA: Diagnosis not present

## 2022-10-10 MED ORDER — CLONAZEPAM 0.5 MG PO TABS
0.5000 mg | ORAL_TABLET | Freq: Two times a day (BID) | ORAL | 0 refills | Status: DC | PRN
  Filled 2022-10-10 – 2022-11-10 (×2): qty 60, 30d supply, fill #0

## 2022-10-10 MED ORDER — LAMOTRIGINE 25 MG PO TABS
25.0000 mg | ORAL_TABLET | Freq: Every day | ORAL | 0 refills | Status: DC
  Filled 2022-10-10: qty 30, 30d supply, fill #0

## 2022-10-12 ENCOUNTER — Other Ambulatory Visit: Payer: Self-pay | Admitting: Gastroenterology

## 2022-10-12 ENCOUNTER — Other Ambulatory Visit (HOSPITAL_BASED_OUTPATIENT_CLINIC_OR_DEPARTMENT_OTHER): Payer: Self-pay

## 2022-10-12 ENCOUNTER — Other Ambulatory Visit: Payer: Self-pay

## 2022-10-12 DIAGNOSIS — K2289 Other specified disease of esophagus: Secondary | ICD-10-CM

## 2022-10-12 MED ORDER — PANTOPRAZOLE SODIUM 40 MG PO TBEC
40.0000 mg | DELAYED_RELEASE_TABLET | Freq: Two times a day (BID) | ORAL | 0 refills | Status: DC
  Filled 2022-10-12: qty 60, 30d supply, fill #0
  Filled 2022-11-13: qty 60, 30d supply, fill #1
  Filled 2022-12-11: qty 60, 30d supply, fill #2

## 2022-10-16 ENCOUNTER — Other Ambulatory Visit (HOSPITAL_BASED_OUTPATIENT_CLINIC_OR_DEPARTMENT_OTHER): Payer: Self-pay

## 2022-11-01 NOTE — Progress Notes (Signed)
Virtual Visit via Video Note  I connected with Ashley Franklin on 11/09/22 at  1:30 PM EDT by a video enabled telemedicine application and verified that I am speaking with the correct person using two identifiers.  Location: Patient: home Provider: office Persons participated in the visit- patient, provider    I discussed the limitations of evaluation and management by telemedicine and the availability of in person appointments. The patient expressed understanding and agreed to proceed.    I discussed the assessment and treatment plan with the patient. The patient was provided an opportunity to ask questions and all were answered. The patient agreed with the plan and demonstrated an understanding of the instructions.   The patient was advised to call back or seek an in-person evaluation if the symptoms worsen or if the condition fails to improve as anticipated.  I provided 24 minutes of non-face-to-face time during this encounter.   [name], MD    [facility] MD/PA/NP OP Progress Note  11/09/2022 2:05 PM Ashley Franklin  MRN:  [id number]  Chief Complaint:  Chief Complaint  Patient presents with   Follow-up   HPI:  This is a follow-up appointment for bipolar disorder, anxiety.  She states that she has been doing the same.  She has not been able to obtain EKG. she states that it was difficult for her to go out of the house due to anxiety.  Although she takes a walk around the house, she feels safer at home.  She does puzzles during the day to keep her mind occupied.  She has been offered for the interview at psychiatry office.  She feels almost dread, very nervous and she is unsure how long she can fake it.  Although she used to be able to do it for long hours, it has been more difficult lately.  She has not been able to go to get a glass, although she has been trying to do so for the past few months.  Coached behavioral activation and exposure therapy.  She is willing to try EKG. she  denies SI.  She denies decreased need for sleep or euphoria.  She denies alcohol use or drug use.  She has noticed hand tremors when she holds something. She is willing to have in person visit.   Visit Diagnosis:    ICD-10-CM   1. Bipolar affective disorder, currently depressed, moderate (HCC)  F31.32     2. Anxiety state  F41.1     3. Iron deficiency anemia, unspecified iron deficiency anemia type  D50.9     4. Vitamin D deficiency  E55.9       Past Psychiatric History: Please see initial evaluation for full details. I have reviewed the history. No updates at this time.     Past Medical History:  Past Medical History:  Diagnosis Date   Anemia    Anxiety    Bipolar affective disorder (HCC)    Blood transfusion without reported diagnosis    Bronchospasm with bronchitis, acute    Depression    Diabetes (HCC)    diet controlled   Kidney stone    Obesity    Right carpal tunnel syndrome 12/09/2018   SBO (small bowel obstruction) (HCC) 01/2014   Sleep apnea    uses CPAP    Past Surgical History:  Procedure Laterality Date   CARPAL TUNNEL RELEASE Right 08/05/2019   Procedure: RIGHT CARPAL TUNNEL RELEASE;  Surgeon: Cindee Salt, MD;  Location: [facility];  Service:  Orthopedics;  Laterality: Right;  IV REGIONAL FOREARM BLOCK   CESAREAN SECTION  1994   ENDOMETRIAL ABLATION     GASTRIC BYPASS  2001   KIDNEY STONE SURGERY  2010   SBO  2015    Family Psychiatric History: Please see initial evaluation for full details. I have reviewed the history. No updates at this time.     Family History:  Family History  Problem Relation Age of Onset   Diabetes Mother    Cancer Mother 48       Throat    Bipolar disorder Mother    Kidney disease Mother    Heart disease Father    Bipolar disorder Sister    Bipolar disorder Sister    Bipolar disorder Brother    Drug abuse Brother    Colon cancer Maternal Aunt    Kidney disease Maternal Grandmother    Heart disease  Paternal Grandfather    Cancer - Prostate Neg Hx    Breast cancer Neg Hx    Esophageal cancer Neg Hx    Rectal cancer Neg Hx    Stomach cancer Neg Hx     Social History:  Social History   Socioeconomic History   Marital status: Married    Spouse name: Not on file   Number of children: 2   Years of education: Not on file   Highest education level: Not on file  Occupational History   Occupation: office administor  Tobacco Use   Smoking status: Former    Packs/day: 0.25    Years: 6.00    Additional pack years: 0.00    Total pack years: 1.50    Types: Cigarettes   Smokeless tobacco: Never   Tobacco comments:    1/2 ppd   Vaping Use   Vaping Use: Never used  Substance and Sexual Activity   Alcohol use: Yes    Alcohol/week: 0.0 standard drinks of alcohol    Comment: Two times a week.    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, music teacher   Right handed    Caffeine 2 cups daily    Social Determinants of Health   Financial Resource Strain: Not on file  Food Insecurity: Not on file  Transportation Needs: Not on file  Physical Activity: Not on file  Stress: Not on file  Social Connections: Not on file    Allergies:  Allergies  Allergen Reactions   Nsaids Other (See Comments)    Contraindicated with gastric bypass    Metabolic Disorder Labs: Lab Results  Component Value Date   HGBA1C 4.9 02/06/2017   MPG 105 02/26/2015   No results found for: "PROLACTIN" Lab Results  Component Value Date   CHOL 165 02/06/2017   TRIG 87 02/06/2017   HDL 54 02/06/2017   LDLCALC 94 02/06/2017   Lab Results  Component Value Date    TSH 1.12 07/10/2017   TSH 1.22 06/08/2016    Therapeutic Level Labs: No results found for: "LITHIUM" Lab Results  Component Value Date   VALPROATE 67 03/27/2022  VALPROATE 62 11/29/2020   No results found for: "CBMZ"  Current Medications: Current Outpatient Medications  Medication Sig Dispense Refill   amphetamine-dextroamphetamine (ADDERALL XR) 25 MG 24 hr capsule Take 1 capsule by mouth every morning. 30 capsule 0   Armodafinil 250 MG tablet Take 1 tablet (250 mg total) by mouth daily 30 tablet 5   Armodafinil 250 MG tablet Take 1 tablet (250 mg total) by mouth daily. 30 tablet 5   atorvastatin (LIPITOR) 20 MG tablet Take 1 tablet (20 mg total) by mouth at bedtime. 90 tablet 0   clonazePAM (KLONOPIN) 0.5 MG tablet Take 1 tablet (0.5 mg total) by mouth 2 (two) times daily as needed for anxiety. 60 tablet 0   FLUoxetine (PROZAC) 40 MG capsule Take 1 capsule (40 mg total) by mouth daily. 30 capsule 1   hydrOXYzine (ATARAX) 25 MG tablet Take 1 tablet (25 mg total) by mouth daily as needed for anxiety. 30 tablet 2   lamoTRIgine (LAMICTAL) 200 MG tablet Take 1 tablet (200 mg total) by mouth daily. 30 tablet 4   lamoTRIgine (LAMICTAL) 25 MG tablet Take 1 tablet (25 mg total) by mouth daily. Start after discontinuation of depakote 30 tablet 0   lubiprostone (AMITIZA) 24 MCG capsule Take 1 capsule (24 mcg total) by mouth 2 (two) times daily. 120 capsule 3   lurasidone (LATUDA) 80 MG TABS tablet Take 1 tablet (80 mg total) by mouth daily with breakfast. 30 tablet 2   omeprazole (PRILOSEC) 20 MG capsule Take 1 capsule (20 mg total) by mouth 2 (two) times daily before a meal. Best to take on an empty stomach. 90 capsule 1   pantoprazole (PROTONIX) 40 MG tablet Take 1 tablet (40 mg total) by mouth 2 (two) times daily. 180 tablet 0   No current facility-administered medications for this visit.     Musculoskeletal: Strength & Muscle Tone:  N/A Gait & Station:  N/A Patient leans:  N/A  Psychiatric Specialty Exam: Review of Systems  Psychiatric/Behavioral:  Positive for dysphoric mood and sleep disturbance. Negative for agitation, behavioral problems, confusion, decreased concentration, hallucinations, self-injury and suicidal ideas. The patient is nervous/anxious. The patient is not hyperactive.   All other systems reviewed and are negative.   There were no vitals taken for this visit.There is no height or weight on file to calculate BMI.  General Appearance: Fairly Groomed  Eye Contact:  Good  Speech:  Clear and Coherent  Volume:  Normal  Mood:  Anxious  Affect:  Appropriate, Congruent, and Tearful  Thought Process:  Coherent  Orientation:  Full (Time, Place, and Person)  Thought Content: Logical   Suicidal Thoughts:  No  Homicidal Thoughts:  No  Memory:  Immediate;   Good  Judgement:  Good  Insight:  Good  Psychomotor Activity:  Normal  Concentration:  Concentration: Good and Attention Span: Good  Recall:  Good  Fund of Knowledge: Good  Language: Good  Akathisia:  No  Handed:  Right  AIMS (if indicated): not done  Assets:  Communication Skills Desire for Improvement  ADL's:  Intact  Cognition: WNL  Sleep:  Poor   Screenings: [facility] Office Visit from 11/08/2021 in [facility] Video Visit from 06/03/2021 in [facility] Video Visit from 02/25/2021 in [facility] Office Visit from 11/29/2020 in [facility] Video Visit from 08/31/2020 in [facility]  PHQ-2 Total Score 4 0 0 1 0  PHQ-9 Total Score 15 -- -- -- --      Flowsheet Row Office Visit from 11/08/2021 in [facility] ED from 08/11/2021 in [facility] Video Visit from 02/25/2021 in [facility]  C-SSRS RISK CATEGORY No Risk No Risk No Risk        Assessment and Plan:  Ashley Franklin is a 52 y.o. year old female with a history of bipolar disorder, anxiety, anemia, obesity s/p gastric bypass surgery, who presents for follow up appointment for below.   1. Bipolar affective disorder, currently depressed, moderate (HCC) 2. Anxiety state Acute stressors include: termination at work, loneliness, brother with substance use, who recently had MVA, her son, who had alcohol withdrawal/seizure Other stressors include: separation,  her mother with cancer in spine    History:Tx from Dr. Lolly Mustache. History of  impulsive sexually inappropriate behavior, irritability, anger, grandiosity when her husband left in 2010 Admitted in 2015 for depression. Originally on stelazine, trileptal 300 mg TID, fluoxetine 60 mg, amitriptyline 25 mg TID, valium 5 mg TID, Ambien 10 mg qhs  Exam is notable for less emotional lability while being tearful. She continues to experience significant depressive symptoms and intense anxiety.  She was advised again to obtain EKG to safely uptitrate lamotrigine.  Will continue current dose of lamotrigine along with fluoxetine for bipolar depression.  Will continue clonazepam, hydroxyzine as needed for anxiety.  Coached behavioral activation.   3. Iron deficiency anemia, unspecified iron deficiency anemia type *Hb 9.1 4. Vitamin D deficiency She had a recent PCP visit a few days ago.  Labs were reviewed.  She does have iron deficiency anemia and vitamin D deficiency.  She was advised to reach out to PCP for further guidance.    # tremor R/o postural termor She describes tremors, what sounds to be postural tremors, which was not apparent on video visit.  She agrees to do in person visit for further evaluation especially given risk of possible adverse reaction from latuda.     Plan:  Obtain EKG If QTc is within normal limit, will plan to uptitrate lamotrigine  200 mg daily, and 50 mg at night (uptitrate from 200 mg daily, 25 mg at night ) Continue Latuda 80 mg daily - akathisia from 100 mg.  Continue fluoxetine 40 mg daily  Continue clonazepam 0.5 mg twice a day as needed for anxiety, - refill left Continue hydroxyzine 25 mg daily as needed for anxiety - rarely takes it Next appointment: 8/19 at 8 30 for 30 mins, IP  [email or web address]   - on Adderall XR 25 mg daily, armodafinil for narcolepsy   Past trials of medication: Lexapro, Paxil, amitriptyline, Abilify (irritable), oxcarbazepine, perphenazine, Trifluoperazine, Klonopin, Valium. Vistaril, Ambien    The patient demonstrates the following  risk factors for suicide: Chronic risk factors for suicide include psychiatric disorder /bipolar disorder, previous self-harm of scratching herself. Acute risk factors for suicide include none. Protective factors for this patient include positive social support, positive therapeutic relationship, hope for the future. Considering these factors, the overall suicide risk at this point appears to be moderate, but not imminent. Patient denies gun access. Discussed in detail safety plan that anytime having active suicidal thoughts or homicidal thoughts and she need to call 911 or go to local emergency room.     Collaboration of Care: Collaboration of Care: Other reviewed notes in Epic  Patient/Guardian  was advised Release of Information must be obtained prior to any record release in order to collaborate their care with an outside provider. Patient/Guardian was advised if they have not already done so to contact the registration department to sign all necessary forms in order for [place] to release information regarding their care.   Consent: Patient/Guardian gives verbal consent for treatment and assignment of benefits for services provided during this visit. Patient/Guardian expressed understanding and agreed to proceed.    [name], MD 11/09/2022,  2:05 PM

## 2022-11-06 ENCOUNTER — Other Ambulatory Visit: Payer: Self-pay | Admitting: Psychiatry

## 2022-11-07 ENCOUNTER — Other Ambulatory Visit (HOSPITAL_BASED_OUTPATIENT_CLINIC_OR_DEPARTMENT_OTHER): Payer: Self-pay

## 2022-11-08 ENCOUNTER — Other Ambulatory Visit (HOSPITAL_BASED_OUTPATIENT_CLINIC_OR_DEPARTMENT_OTHER): Payer: Self-pay

## 2022-11-08 ENCOUNTER — Other Ambulatory Visit: Payer: Self-pay | Admitting: Psychiatry

## 2022-11-08 MED ORDER — AMPHETAMINE-DEXTROAMPHET ER 25 MG PO CP24
25.0000 mg | ORAL_CAPSULE | Freq: Every morning | ORAL | 0 refills | Status: DC
  Filled 2022-12-15: qty 30, 30d supply, fill #0

## 2022-11-08 MED ORDER — ATORVASTATIN CALCIUM 20 MG PO TABS
20.0000 mg | ORAL_TABLET | Freq: Every day | ORAL | 0 refills | Status: DC
  Filled 2022-11-08: qty 90, 90d supply, fill #0

## 2022-11-09 ENCOUNTER — Telehealth: Payer: Medicaid Other | Admitting: Psychiatry

## 2022-11-09 ENCOUNTER — Other Ambulatory Visit: Payer: Self-pay

## 2022-11-09 ENCOUNTER — Encounter: Payer: Self-pay | Admitting: Psychiatry

## 2022-11-09 ENCOUNTER — Other Ambulatory Visit (HOSPITAL_BASED_OUTPATIENT_CLINIC_OR_DEPARTMENT_OTHER): Payer: Self-pay

## 2022-11-09 DIAGNOSIS — D509 Iron deficiency anemia, unspecified: Secondary | ICD-10-CM

## 2022-11-09 DIAGNOSIS — F3132 Bipolar disorder, current episode depressed, moderate: Secondary | ICD-10-CM | POA: Diagnosis not present

## 2022-11-09 DIAGNOSIS — E559 Vitamin D deficiency, unspecified: Secondary | ICD-10-CM

## 2022-11-09 DIAGNOSIS — F411 Generalized anxiety disorder: Secondary | ICD-10-CM | POA: Diagnosis not present

## 2022-11-09 MED ORDER — FLUOXETINE HCL 40 MG PO CAPS
40.0000 mg | ORAL_CAPSULE | Freq: Every day | ORAL | 1 refills | Status: DC
  Filled 2022-11-09: qty 30, 30d supply, fill #0
  Filled 2022-12-05: qty 30, 30d supply, fill #1

## 2022-11-10 ENCOUNTER — Other Ambulatory Visit (HOSPITAL_BASED_OUTPATIENT_CLINIC_OR_DEPARTMENT_OTHER): Payer: Self-pay

## 2022-11-13 ENCOUNTER — Other Ambulatory Visit (HOSPITAL_BASED_OUTPATIENT_CLINIC_OR_DEPARTMENT_OTHER): Payer: Self-pay

## 2022-11-14 ENCOUNTER — Other Ambulatory Visit (HOSPITAL_BASED_OUTPATIENT_CLINIC_OR_DEPARTMENT_OTHER): Payer: Self-pay

## 2022-11-14 ENCOUNTER — Ambulatory Visit
Admission: RE | Admit: 2022-11-14 | Discharge: 2022-11-14 | Disposition: A | Discharge status: Home / Self Care | Payer: Medicaid Other | Source: Ambulatory Visit | Attending: Psychiatry | Admitting: Psychiatry

## 2022-11-14 DIAGNOSIS — R9431 Abnormal electrocardiogram [ECG] [EKG]: Secondary | ICD-10-CM | POA: Insufficient documentation

## 2022-11-15 ENCOUNTER — Other Ambulatory Visit (HOSPITAL_BASED_OUTPATIENT_CLINIC_OR_DEPARTMENT_OTHER): Payer: Self-pay

## 2022-11-15 ENCOUNTER — Other Ambulatory Visit: Payer: Self-pay | Admitting: Psychiatry

## 2022-11-15 MED ORDER — LAMOTRIGINE 25 MG PO TABS
50.0000 mg | ORAL_TABLET | Freq: Every day | ORAL | 0 refills | Status: DC
  Filled 2022-11-15: qty 60, 30d supply, fill #0

## 2022-11-15 NOTE — Progress Notes (Signed)
Reviewed EKG: HR 85, QTc 454 msec, T wave inversion is noted.  Please contact the patient.  - The EKG looks good unless she experiences any chest symptoms; if that is the case, please advise her to contact her primary care provider.  - I ordered lamotrigine so that she can increase the morning dose to 50 mg daily. - I would like her PCP to review the EKG for their records and to ensure she does not need any intervention at this time. Please ask if that is okay with her, and fax the EKG report to her PCP.

## 2022-11-15 NOTE — Progress Notes (Signed)
Spoke to patient advise her of the EKG results she voiced understanding and stated that she would call her PCP to discuss the results

## 2022-12-05 ENCOUNTER — Other Ambulatory Visit: Payer: Self-pay

## 2022-12-05 ENCOUNTER — Other Ambulatory Visit: Payer: Self-pay | Admitting: Psychiatry

## 2022-12-09 NOTE — Progress Notes (Deleted)
BH MD/PA/NP OP Progress Note  12/09/2022 4:36 PM Ashley Franklin  MRN:  [id number]  Chief Complaint: No chief complaint on file.  HPI: *** Visit Diagnosis: No diagnosis found.  Past Psychiatric History: Please see initial evaluation for full details. I have reviewed the history. No updates at this time.     Past Medical History:  Past Medical History:  Diagnosis Date   Anemia    Anxiety    Bipolar affective disorder (HCC)    Blood transfusion without reported diagnosis    Bronchospasm with bronchitis, acute    Depression    Diabetes (HCC)    diet controlled   Kidney stone    Obesity    Right carpal tunnel syndrome 12/09/2018   SBO (small bowel obstruction) (HCC) 01/2014   Sleep apnea    uses CPAP    Past Surgical History:  Procedure Laterality Date   CARPAL TUNNEL RELEASE Right 08/05/2019   Procedure: RIGHT CARPAL TUNNEL RELEASE;  Surgeon: Cindee Salt, MD;  Location: [facility];  Service: Orthopedics;  Laterality: Right;  IV REGIONAL FOREARM BLOCK   CESAREAN SECTION  1994   ENDOMETRIAL ABLATION     GASTRIC BYPASS  2001   KIDNEY STONE SURGERY  2010   SBO  2015    Family Psychiatric History: Please see initial evaluation for full details. I have reviewed the history. No updates at this time.     Family History:  Family History  Problem Relation Age of Onset   Diabetes Mother    Cancer Mother 64       Throat    Bipolar disorder Mother    Kidney disease Mother    Heart disease Father    Bipolar disorder Sister    Bipolar disorder Sister    Bipolar disorder Brother    Drug abuse Brother    Colon cancer Maternal Aunt    Kidney disease Maternal Grandmother    Heart disease Paternal Grandfather    Cancer - Prostate Neg Hx    Breast cancer Neg Hx    Esophageal cancer Neg Hx    Rectal cancer Neg Hx    Stomach cancer Neg Hx     Social History:  Social History   Socioeconomic History   Marital status: Married    Spouse name: Not on file    Number of children: 2   Years of education: Not on file   Highest education level: Not on file  Occupational History   Occupation: office administor  Tobacco Use   Smoking status: Former    Current packs/day: 0.25    Average packs/day: 0.3 packs/day for 6.0 years (1.5 ttl pk-yrs)    Types: Cigarettes   Smokeless tobacco: Never   Tobacco comments:    1/2 ppd   Vaping Use   Vaping status: Never Used  Substance and Sexual Activity   Alcohol use: Yes    Alcohol/week: 0.0 standard drinks of alcohol    Comment: Two times a week.    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 : Never true  Transportation Needs: Not on file (11/06/2022)  Physical Activity: Not on file  Stress: Not on file  Social Connections: Not on file    Allergies:  Allergies  Allergen Reactions   Nsaids Other (See Comments)    Contraindicated with gastric bypass    Metabolic Disorder Labs: Lab Results  Component Value Date   HGBA1C 4.9 02/06/2017   MPG 105 02/26/2015   No results found for: "PROLACTIN" Lab Results  Component Value Date   CHOL 165 02/06/2017   TRIG 87 02/06/2017   HDL 54 02/06/2017   LDLCALC 94  02/06/2017   Lab Results  Component Value Date   TSH 1.12 07/10/2017   TSH 1.22 06/08/2016    Therapeutic Level Labs: No results found for: "LITHIUM" Lab Results  Component Value Date   VALPROATE 67 03/27/2022   VALPROATE 62 11/29/2020   No results found for: "CBMZ"  Current Medications: Current Outpatient Medications  Medication Sig Dispense Refill   amphetamine-dextroamphetamine (ADDERALL XR) 25 MG 24 hr capsule Take 1 capsule by mouth every morning. 30 capsule 0   Armodafinil 250 MG tablet Take 1 tablet (250 mg total) by mouth daily 30 tablet 5   Armodafinil 250 MG tablet Take 1 tablet (250 mg total) by mouth daily. 30 tablet 5   atorvastatin (LIPITOR) 20 MG tablet Take 1 tablet (20 mg total) by mouth at bedtime. 90 tablet 0   clonazePAM (KLONOPIN) 0.5 MG tablet Take 1 tablet (0.5 mg total) by mouth 2 (two) times daily as needed for anxiety. 60 tablet 0   FLUoxetine (PROZAC) 40 MG capsule Take 1 capsule (40 mg total) by mouth daily. 30 capsule 1   lamoTRIgine (LAMICTAL) 200 MG tablet Take 1 tablet (200 mg total) by mouth daily. 30 tablet 4   lamoTRIgine (LAMICTAL) 25 MG tablet Take 2 tablets (50 mg total) by mouth daily. Dose uptitrated 60 tablet 0   lubiprostone (AMITIZA) 24 MCG capsule Take 1 capsule (24 mcg total) by mouth 2 (two) times daily. 120 capsule 3   lurasidone (LATUDA) 80 MG TABS tablet Take 1 tablet (80 mg total) by mouth daily with breakfast. 30 tablet 2   omeprazole (PRILOSEC) 20 MG capsule Take 1 capsule (20 mg total) by mouth 2 (two) times daily before a meal. Best to take on an empty stomach. 90 capsule 1   pantoprazole (PROTONIX) 40 MG tablet Take 1 tablet (40 mg total) by mouth 2 (two) times daily. 180 tablet 0   No current facility-administered medications for this visit.     Musculoskeletal: Strength & Muscle Tone: within normal limits Gait & Station: normal Patient leans: N/A  Psychiatric Specialty Exam: Review of Systems  There were no vitals  taken for this visit.There is no height or weight on file to calculate BMI.  General Appearance: {Appearance:22683}  Eye Contact:  {BHH EYE CONTACT:22684}  Speech:  Clear and Coherent  Volume:  Normal  Mood:  {  BHH VOZD:66440}  Affect:  {Affect (PAA):22687}  Thought Process:  Coherent  Orientation:  Full (Time, Place, and Person)  Thought Content: Logical   Suicidal Thoughts:  {ST/HT (PAA):22692}  Homicidal Thoughts:  {ST/HT (PAA):22692}  Memory:  Immediate;   Good  Judgement:  {Judgement (PAA):22694}  Insight:  {Insight (PAA):22695}  Psychomotor Activity:  Normal  Concentration:  Concentration: Good and Attention Span: Good  Recall:  Good  Fund of Knowledge: Good  Language: Good  Akathisia:  No  Handed:  Right  AIMS (if indicated): not done  Assets:  Communication Skills Desire for Improvement  ADL's:  Intact  Cognition: WNL  Sleep:  {BHH GOOD/FAIR/POOR:22877}   Screenings: [employer] [facility] Office Visit from 11/08/2021 in [facility] Video Visit from 06/03/2021 in [facility] Video Visit from 02/25/2021 in [facility] Office Visit from 11/29/2020 in [facility] Video Visit from 08/31/2020 in [facility]  PHQ-2 Total Score 4 0 0 1 0  PHQ-9 Total Score 15 -- -- -- --      Flowsheet Row Office Visit from 11/08/2021 in [facility] ED from 08/11/2021 in [facility] Video Visit from 02/25/2021 in [facility]  C-SSRS RISK CATEGORY No Risk No Risk No Risk        Assessment and Plan:  WYNONNA OESTREICHER is a 52 y.o. year old female with a history of bipolar disorder, anxiety, anemia, obesity s/p gastric bypass surgery, who presents for follow up appointment for below.     1. Bipolar affective disorder, currently depressed, moderate (HCC) 2. Anxiety state Acute stressors include: termination at work, loneliness, brother with substance use, who recently had MVA, her son, who had alcohol withdrawal/seizure Other stressors include: separation,  her mother with cancer in spine    History:Tx from Dr. Lolly Mustache. History of  impulsive sexually inappropriate behavior, irritability, anger, grandiosity when her husband left in 2010 Admitted in 2015 for depression. Originally on stelazine, trileptal 300 mg TID, fluoxetine 60 mg, amitriptyline 25 mg TID, valium 5 mg TID, Ambien 10 mg qhs  Exam is notable for less emotional lability while being tearful. She continues to experience significant depressive symptoms and intense anxiety.  She was advised again to obtain EKG to safely uptitrate lamotrigine.  Will continue current dose of lamotrigine along with fluoxetine for bipolar depression.  Will continue clonazepam, hydroxyzine as needed for anxiety.  Coached behavioral activation.    3. Iron deficiency anemia, unspecified iron deficiency anemia type *Hb 9.1 4. Vitamin D deficiency She had a recent PCP visit a few days ago.  Labs were reviewed.  She does have iron deficiency anemia and vitamin D deficiency.  She was advised to reach out to PCP for further guidance.    # tremor R/o postural termor She describes tremors, what sounds to be postural tremors, which was not apparent on video visit.  She agrees to do in person visit for further evaluation especially given risk of possible adverse reaction from latuda.     Plan:  lamotrigine 200 mg daily, and 50 mg at night (uptitrate from 200 mg daily, 25 mg at night ) EKG: HR 85, QTc 454 msec, T wave inversion 11/2022 Continue Latuda 80 mg daily - akathisia from 100 mg.  Continue fluoxetine 40 mg daily  Continue clonazepam 0.5 mg twice a  day as needed for anxiety, - refill left Continue hydroxyzine 25 mg daily as needed for anxiety -  rarely takes it Next appointment: 8/19 at 8 30 for 30 mins, IP  [email or web address]   - on Adderall XR 25 mg daily, armodafinil for narcolepsy   Past trials of medication: Lexapro, Paxil, amitriptyline, Abilify (irritable), oxcarbazepine, perphenazine, Trifluoperazine, Klonopin, Valium. Vistaril, Ambien    The patient demonstrates the following  risk factors for suicide: Chronic risk factors for suicide include psychiatric disorder /bipolar disorder, previous self-harm of scratching herself. Acute risk factors for suicide include none. Protective factors for this patient include positive social support, positive therapeutic relationship, hope for the future. Considering these factors, the overall suicide risk at this point appears to be moderate, but not imminent. Patient denies gun access. Discussed in detail safety plan that anytime having active suicidal thoughts or homicidal thoughts and she need to call 911 or go to local emergency room.    EKG: HR 85, QTc 454 msec, T wave inversion is noted.   Collaboration of Care: Collaboration of Care: {BH OP Collaboration of Care:21014065}  Patient/Guardian was advised Release of Information must be obtained prior to any record release in order to collaborate their care with an outside provider. Patient/Guardian was advised if they have not already done so to contact the registration department to sign all necessary forms in order for [place] to release information regarding their care.   Consent: Patient/Guardian gives verbal consent for treatment and assignment of benefits for services provided during this visit. Patient/Guardian expressed understanding and agreed to proceed.    [name], MD 12/09/2022, 4:36 PM

## 2022-12-11 ENCOUNTER — Other Ambulatory Visit (HOSPITAL_BASED_OUTPATIENT_CLINIC_OR_DEPARTMENT_OTHER): Payer: Self-pay

## 2022-12-11 ENCOUNTER — Other Ambulatory Visit: Payer: Self-pay | Admitting: Psychiatry

## 2022-12-14 ENCOUNTER — Ambulatory Visit: Payer: Medicaid Other | Admitting: Psychiatry

## 2022-12-14 ENCOUNTER — Other Ambulatory Visit: Payer: Self-pay | Admitting: Psychiatry

## 2022-12-15 ENCOUNTER — Other Ambulatory Visit (HOSPITAL_BASED_OUTPATIENT_CLINIC_OR_DEPARTMENT_OTHER): Payer: Self-pay

## 2022-12-15 ENCOUNTER — Other Ambulatory Visit: Payer: Self-pay | Admitting: Psychiatry

## 2022-12-18 ENCOUNTER — Other Ambulatory Visit (HOSPITAL_BASED_OUTPATIENT_CLINIC_OR_DEPARTMENT_OTHER): Payer: Self-pay

## 2022-12-18 MED ORDER — LURASIDONE HCL 80 MG PO TABS
80.0000 mg | ORAL_TABLET | Freq: Every day | ORAL | 2 refills | Status: DC
  Filled 2022-12-18 – 2022-12-19 (×2): qty 30, 30d supply, fill #0
  Filled 2023-01-15: qty 30, 30d supply, fill #1
  Filled 2023-02-15: qty 30, 30d supply, fill #2

## 2022-12-18 MED ORDER — CLONAZEPAM 0.5 MG PO TABS
0.5000 mg | ORAL_TABLET | Freq: Two times a day (BID) | ORAL | 0 refills | Status: DC | PRN
  Filled 2022-12-18: qty 60, 30d supply, fill #0

## 2022-12-18 MED ORDER — LAMOTRIGINE 200 MG PO TABS
200.0000 mg | ORAL_TABLET | Freq: Every day | ORAL | 4 refills | Status: DC
  Filled 2022-12-18 – 2022-12-19 (×2): qty 30, 30d supply, fill #0
  Filled 2023-01-12: qty 30, 30d supply, fill #1
  Filled 2023-02-15: qty 30, 30d supply, fill #2
  Filled 2023-03-19: qty 30, 30d supply, fill #3
  Filled 2023-04-26: qty 30, 30d supply, fill #4

## 2022-12-18 MED ORDER — LAMOTRIGINE 25 MG PO TABS
50.0000 mg | ORAL_TABLET | Freq: Every day | ORAL | 0 refills | Status: DC
  Filled 2022-12-18: qty 60, 30d supply, fill #0

## 2022-12-19 ENCOUNTER — Other Ambulatory Visit (HOSPITAL_BASED_OUTPATIENT_CLINIC_OR_DEPARTMENT_OTHER): Payer: Self-pay

## 2022-12-19 ENCOUNTER — Other Ambulatory Visit: Payer: Self-pay

## 2022-12-20 ENCOUNTER — Other Ambulatory Visit: Payer: Self-pay

## 2022-12-24 NOTE — Progress Notes (Addendum)
BH MD/PA/NP OP Progress Note  01/01/2023 9:35 AM CYNDIE DEBOLT  MRN:  [id number]  Chief Complaint:  Chief Complaint  Patient presents with   Follow-up   HPI:  According to the chart review, the following events have occurred since the last visit: The patient was seen by PCP. She was found ot have iron deficiency anemia, and was started on iron repletion.   This is a follow-up appointment for bipolar disorder, anxiety.  She states that she has been stressed.  Her brother had DUI, car accident and currently is in jail.  Their father took it so hard.  He received a phone call from him, crying.  She feels ashamed and angry with her brother, to cause him this stress.  He does not seem to be caring.  She states that she only had 2 interviews over more than 100 application.  Although she completed school, she states that nobody wants her.  She agrees that it has been difficult to see the positive aspect.  She tends to sleep over a few days.  She finds it especially difficult later in the day after she gets all things done.  She tends to think about things, and feels restless.  She eats out of boredom and stress.  She likes cooking, and she agrees to try cooking vegetables when she wants to eat snacks.  She takes a walk at least a few times per week. The patient has mood symptoms as in PHQ-9/GAD-7. She feels fatigue.  She does not think she had weight gain secondary to medication .  She attributes this to not getting out of home, and her food choice, which she agreed to make change .no euphoria, decreased need for sleep. Although she wishes that things would quit, she denies any SI intent or plan.  She agrees to contact emergency resources if any worsening. She denies alcohol use, drug use.   Wt Readings from Last 3 Encounters:  01/01/23 250 lb 12.8 oz (113.8 kg)  11/08/21 205 lb 9.6 oz (93.3 kg)  08/11/21 175 lb (79.4 kg)      Visit Diagnosis:    ICD-10-CM   1. Bipolar affective disorder,  currently depressed, moderate (HCC)  F31.32     2. Anxiety state  F41.1       Past Psychiatric History: Please see initial evaluation for full details. I have reviewed the history. No updates at this time.     Past Medical History:  Past Medical History:  Diagnosis Date   Anemia    Anxiety    Bipolar affective disorder (HCC)    Blood transfusion without reported diagnosis    Bronchospasm with bronchitis, acute    Depression    Diabetes (HCC)    diet controlled   Kidney stone    Obesity    Right carpal tunnel syndrome 12/09/2018   SBO (small bowel obstruction) (HCC) 01/2014   Sleep apnea    uses CPAP    Past Surgical History:  Procedure Laterality Date   CARPAL TUNNEL RELEASE Right 08/05/2019   Procedure: RIGHT CARPAL TUNNEL RELEASE;  Surgeon: Cindee Salt, MD;  Location: [facility];  Service: Orthopedics;  Laterality: Right;  IV REGIONAL FOREARM BLOCK   CESAREAN SECTION  1994   ENDOMETRIAL ABLATION     GASTRIC BYPASS  2001   KIDNEY STONE SURGERY  2010   SBO  2015    Family Psychiatric History: Please see initial evaluation for full details. I have reviewed the history. No  updates at this time.     Family History:  Family History  Problem Relation Age of Onset   Diabetes Mother    Cancer Mother 40       Throat    Bipolar disorder Mother    Kidney disease Mother    Heart disease Father    Bipolar disorder Sister    Bipolar disorder Sister    Bipolar disorder Brother    Drug abuse Brother    Colon cancer Maternal Aunt    Kidney disease Maternal Grandmother    Heart disease Paternal Grandfather    Cancer - Prostate Neg Hx    Breast cancer Neg Hx    Esophageal cancer Neg Hx    Rectal cancer Neg Hx    Stomach cancer Neg Hx     Social History:  Social History   Socioeconomic History   Marital status: Married    Spouse name: Not on file   Number of children: 2   Years of education: Not on file   Highest education level: Not on file   Occupational History   Occupation: office administor  Tobacco Use   Smoking status: Former    Current packs/day: 0.25    Average packs/day: 0.3 packs/day for 6.0 years (1.5 ttl pk-yrs)    Types: Cigarettes   Smokeless tobacco: Never   Tobacco comments:    1/2 ppd   Vaping Use   Vaping status: Never Used  Substance and Sexual Activity   Alcohol use: Yes    Alcohol/week: 0.0 standard drinks of alcohol    Comment: Two times a week.    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 : Never true  Transportation Needs: Not on file (11/06/2022)  Physical Activity: Not on file  Stress: Not on file  Social Connections: Not on file    Allergies:  Allergies  Allergen Reactions   Nsaids Other (See Comments)    Contraindicated with gastric bypass    Metabolic Disorder Labs: Lab Results  Component Value Date   HGBA1C 4.9 02/06/2017   MPG 105 02/26/2015   No results found for: "PROLACTIN" Lab Results  Component Value Date   CHOL 165 02/06/2017   TRIG 87 02/06/2017   HDL 54 02/06/2017   LDLCALC 94 02/06/2017   Lab Results  Component Value Date   TSH 1.12 07/10/2017   TSH 1.22 06/08/2016     Therapeutic Level Labs: No results found for: "LITHIUM" Lab Results  Component Value Date   VALPROATE  67 03/27/2022   VALPROATE 62 11/29/2020   No results found for: "CBMZ"  Current Medications: Current Outpatient Medications  Medication Sig Dispense Refill   amphetamine-dextroamphetamine (ADDERALL XR) 25 MG 24 hr capsule Take 1 capsule by mouth every morning. 30 capsule 0   Armodafinil 250 MG tablet Take 1 tablet (250 mg total) by mouth daily 30 tablet 5   Armodafinil 250 MG tablet Take 1 tablet (250 mg total) by mouth daily. 30 tablet 5   atorvastatin (LIPITOR) 20 MG tablet Take 1 tablet (20 mg total) by mouth at bedtime. 90 tablet 0   [START ON 01/07/2023] lamoTRIgine (LAMICTAL) 100 MG tablet Take 1 tablet (100 mg total) by mouth at bedtime. Please start after taking 75 mg at night for one week 30 tablet 1   lamoTRIgine (LAMICTAL) 200 MG tablet Take 1 tablet (200 mg total) by mouth daily. 30 tablet 4   lubiprostone (AMITIZA) 24 MCG capsule Take 1 capsule (24 mcg total) by mouth 2 (two) times daily. 120 capsule 3   lurasidone (LATUDA) 80 MG TABS tablet Take 1 tablet (80 mg total) by mouth daily with breakfast. 30 tablet 2   omeprazole (PRILOSEC) 20 MG capsule Take 1 capsule (20 mg total) by mouth 2 (two) times daily before a meal. Best to take on an empty stomach. 90 capsule 1   pantoprazole (PROTONIX) 40 MG tablet Take 1 tablet (40 mg total) by mouth 2 (two) times daily. 180 tablet 0   [START ON 01/19/2023] clonazePAM (KLONOPIN) 0.5 MG tablet Take 1 tablet (0.5 mg total) by mouth 2 (two) times daily as needed for anxiety. 60 tablet 0   [START ON 01/19/2023] FLUoxetine (PROZAC) 40 MG capsule Take 1 capsule (40 mg total) by mouth daily. 30 capsule 3   lamoTRIgine (LAMICTAL) 25 MG tablet Take 2 tablets (50 mg total) by mouth daily. Dose uptitrated. 60 tablet 0   No current facility-administered medications for this visit.     Musculoskeletal: Strength & Muscle Tone: within normal  limits Gait & Station: normal Patient leans: N/A  Psychiatric Specialty Exam: Review of Systems  Psychiatric/Behavioral:  Positive for dysphoric mood and sleep disturbance. Negative for agitation, behavioral problems, confusion, decreased concentration, hallucinations, self-injury and suicidal ideas. The patient is nervous/anxious. The patient is not hyperactive.   All other systems reviewed and are negative.   Blood pressure 129/82, pulse (!) 108, temperature 98 F (36.7 C), temperature source Skin, height 5\' 4"  (1.626 m), weight 250 lb 12.8 oz (113.8 kg).Body mass index is 43.05 kg/m.  General Appearance: Fairly Groomed  Eye Contact:  Good  Speech:  Clear and Coherent  Volume:  Normal  Mood:  Anxious  Affect:  Appropriate, Congruent, and Tearful  Thought Process:  Coherent  Orientation:  Full (Time, Place, and Person)  Thought Content: Logical   Suicidal Thoughts:  No  Homicidal Thoughts:  No  Memory:  Immediate;   Good  Judgement:  Good  Insight:  Good  Psychomotor Activity:   postural tremors in bilateral hands, normal tone, no rigidity  .  Concentration:  Concentration: Good and Attention Span: Good  Recall:  Good  Fund of Knowledge: Good  Language: Good  Akathisia:  No  Handed:  Right  AIMS (if indicated): not done  Assets:  Communication Skills Desire for Improvement  ADL's:  Intact  Cognition: WNL  Sleep:  Poor   Screenings: GAD-7    Flowsheet Row Office Visit from 01/01/2023 in [facility]  Total  GAD-7 Score 19      PHQ2-9    Flowsheet Row Office Visit from 01/01/2023 in [facility] Office Visit from 11/08/2021 in [facility] Video Visit from 06/03/2021 in [facility] Video Visit from 02/25/2021 in [facility] Office Visit from 11/29/2020 in [facility]  PHQ-2 Total Score 6 4 0 0 1  PHQ-9 Total Score 23 15 -- -- --      Flowsheet Row Office Visit from 01/01/2023 in [facility] Office Visit from 11/08/2021 in [facility] ED from 08/11/2021 in [facility] RISK CATEGORY Error: Q3, 4, or 5 should not be populated when Q2 is No No Risk No Risk        Assessment and Plan:  NOEL BROOKING is a 52 y.o. year old female with a history of bipolar disorder, anxiety, anemia, obesity s/p gastric bypass surgery, who presents for follow up appointment for below.   1. Bipolar affective disorder, currently depressed, moderate (HCC) 2. Anxiety state Acute stressors include: termination at work, loneliness, brother with substance use, who recently had MVA, her son, who had alcohol withdrawal/seizure Other stressors include: separation,  her mother with cancer in spine    History:Tx from Dr. Lolly Mustache. History of  impulsive sexually inappropriate behavior, irritability, anger, grandiosity when her husband left in 2010 Admitted in 2015 for depression. Originally on stelazine, trileptal 300 mg TID, fluoxetine 60 mg, amitriptyline 25 mg TID, valium 5 mg TID, Ambien 10 mg qhs   She continues to experience depressive symptoms in the context of stressors as above.  We uptitrate lamotrigine to optimize treatment for bipolar depression.  Discussed potential risk of Stevens-Johnson syndrome.  Will continue Latuda for bipolar depression along with fluoxetine.  She will greatly benefit from CBT.  She was advised to contact her insurance company to see who is in network if [facility] does not take her insurance.  # tremor R/o postural termor She has postural hand tremors.  Although she reports some shakiness in her leg, it was not noticeable during the exam.  Will continue to assess.     Plan:  Increase lamotrigine 200 mg daily, 75  mg at night for one week, then 200 mg daily, 100 mg at night  (uptitrate from 200 mg daily, 50 mg at night ) EKG: HR 85, QTc 454 msec, T wave inversion 11/2022 Continue Latuda 80 mg daily - akathisia from 100 mg. Continue fluoxetine 40 mg daily  Continue clonazepam 0.5 mg twice a day as needed for anxiety Hold hydroxyzine Next appointment: 10/1 at 11:30, IP Consider contacting [facility]-  [phone] - on Adderall XR 25 mg daily, armodafinil for narcolepsy   Past trials of medication: Lexapro, Paxil, amitriptyline, Abilify (irritable), oxcarbazepine, perphenazine, Trifluoperazine, Klonopin, Valium. Vistaril, Ambien    The patient demonstrates the following  risk factors for suicide: Chronic risk factors for suicide include psychiatric disorder /bipolar disorder, previous self-harm of scratching herself. Acute risk factors for suicide include none. Protective factors for this patient include positive social support, positive therapeutic relationship, hope for the future. Considering these factors, the overall suicide risk at this point appears to be moderate, but not imminent. Patient denies gun access. Discussed in detail safety plan that anytime having active suicidal thoughts or homicidal thoughts and she need to call 911 or go  to local emergency room.      Collaboration of Care: Collaboration of Care: Other reviewed notes in Epic  Patient/Guardian was advised Release of Information must be obtained prior to any record release in order to collaborate their care with an outside provider. Patient/Guardian was advised if they have not already done so to contact the registration department to sign all necessary forms in order for [place] to release information regarding their care.   Consent: Patient/Guardian gives verbal consent for treatment and assignment of benefits for services provided during this visit. Patient/Guardian expressed understanding and agreed to proceed.    [name],  MD 01/01/2023, 9:35 AM

## 2023-01-01 ENCOUNTER — Ambulatory Visit: Payer: Medicaid Other | Admitting: Psychiatry

## 2023-01-01 ENCOUNTER — Ambulatory Visit (INDEPENDENT_AMBULATORY_CARE_PROVIDER_SITE_OTHER): Payer: Medicaid Other | Admitting: Psychiatry

## 2023-01-01 ENCOUNTER — Encounter: Payer: Self-pay | Admitting: Psychiatry

## 2023-01-01 ENCOUNTER — Other Ambulatory Visit (HOSPITAL_BASED_OUTPATIENT_CLINIC_OR_DEPARTMENT_OTHER): Payer: Self-pay

## 2023-01-01 VITALS — BP 129/82 | HR 108 | Temp 98.0°F | Ht 64.0 in | Wt 250.8 lb

## 2023-01-01 DIAGNOSIS — F3132 Bipolar disorder, current episode depressed, moderate: Secondary | ICD-10-CM | POA: Diagnosis not present

## 2023-01-01 DIAGNOSIS — F411 Generalized anxiety disorder: Secondary | ICD-10-CM | POA: Diagnosis not present

## 2023-01-01 MED ORDER — CLONAZEPAM 0.5 MG PO TABS
0.5000 mg | ORAL_TABLET | Freq: Two times a day (BID) | ORAL | 0 refills | Status: DC | PRN
  Filled 2023-01-01: qty 60, 30d supply, fill #0

## 2023-01-01 MED ORDER — LAMOTRIGINE 25 MG PO TABS
50.0000 mg | ORAL_TABLET | Freq: Every day | ORAL | 0 refills | Status: DC
  Filled 2023-01-01 – 2023-01-15 (×3): qty 60, 30d supply, fill #0

## 2023-01-01 MED ORDER — LAMOTRIGINE 100 MG PO TABS
100.0000 mg | ORAL_TABLET | Freq: Every day | ORAL | 1 refills | Status: DC
  Filled 2023-01-01: qty 30, 30d supply, fill #0

## 2023-01-01 MED ORDER — FLUOXETINE HCL 40 MG PO CAPS
40.0000 mg | ORAL_CAPSULE | Freq: Every day | ORAL | 3 refills | Status: DC
  Filled 2023-01-01 – 2023-01-19 (×2): qty 30, 30d supply, fill #0
  Filled 2023-02-15: qty 30, 30d supply, fill #1
  Filled 2023-03-19: qty 30, 30d supply, fill #2
  Filled 2023-04-26: qty 30, 30d supply, fill #3

## 2023-01-01 NOTE — Patient Instructions (Addendum)
Increase lamotrigine 200 mg daily, 75 mg at night for one week, then 200 mg daily, 100 mg at night   Continue Latuda 80 mg daily Continue fluoxetine 40 mg daily  Continue clonazepam 0.5 mg twice a day as needed for anxiety Hold hydroxyzine Consider contacting [facility]-  [phone] Next appointment: 10/1 at 11:30

## 2023-01-12 ENCOUNTER — Other Ambulatory Visit: Payer: Self-pay

## 2023-01-12 ENCOUNTER — Other Ambulatory Visit (HOSPITAL_BASED_OUTPATIENT_CLINIC_OR_DEPARTMENT_OTHER): Payer: Self-pay

## 2023-01-12 MED ORDER — AMPHETAMINE-DEXTROAMPHET ER 25 MG PO CP24
25.0000 mg | ORAL_CAPSULE | Freq: Every morning | ORAL | 0 refills | Status: DC
  Filled 2023-01-12: qty 30, 30d supply, fill #0

## 2023-01-12 MED ORDER — PANTOPRAZOLE SODIUM 40 MG PO TBEC
40.0000 mg | DELAYED_RELEASE_TABLET | Freq: Two times a day (BID) | ORAL | 0 refills | Status: DC
  Filled 2023-01-15: qty 180, 90d supply, fill #0

## 2023-01-15 ENCOUNTER — Emergency Department (HOSPITAL_BASED_OUTPATIENT_CLINIC_OR_DEPARTMENT_OTHER)
Admission: EM | Admit: 2023-01-15 | Discharge: 2023-01-15 | Disposition: A | Discharge status: Home / Self Care | Payer: Medicaid Other | Attending: Emergency Medicine | Admitting: Emergency Medicine

## 2023-01-15 ENCOUNTER — Emergency Department (HOSPITAL_BASED_OUTPATIENT_CLINIC_OR_DEPARTMENT_OTHER): Payer: Medicaid Other

## 2023-01-15 ENCOUNTER — Other Ambulatory Visit (HOSPITAL_BASED_OUTPATIENT_CLINIC_OR_DEPARTMENT_OTHER): Payer: Self-pay

## 2023-01-15 ENCOUNTER — Encounter (HOSPITAL_BASED_OUTPATIENT_CLINIC_OR_DEPARTMENT_OTHER): Payer: Self-pay

## 2023-01-15 DIAGNOSIS — S8991XA Unspecified injury of right lower leg, initial encounter: Secondary | ICD-10-CM | POA: Diagnosis present

## 2023-01-15 DIAGNOSIS — S80212A Abrasion, left knee, initial encounter: Secondary | ICD-10-CM | POA: Insufficient documentation

## 2023-01-15 DIAGNOSIS — W0110XA Fall on same level from slipping, tripping and stumbling with subsequent striking against unspecified object, initial encounter: Secondary | ICD-10-CM | POA: Diagnosis not present

## 2023-01-15 DIAGNOSIS — S8001XA Contusion of right knee, initial encounter: Secondary | ICD-10-CM | POA: Insufficient documentation

## 2023-01-15 MED ORDER — ACETAMINOPHEN 500 MG PO TABS
1000.0000 mg | ORAL_TABLET | Freq: Four times a day (QID) | ORAL | 0 refills | Status: AC | PRN
Start: 2023-01-15 — End: ?

## 2023-01-15 MED ORDER — CYCLOBENZAPRINE HCL 10 MG PO TABS: 10.0000 mg | ORAL_TABLET | Freq: Two times a day (BID) | ORAL | 0 refills | Status: AC | PRN

## 2023-01-15 MED ORDER — ACETAMINOPHEN 500 MG PO TABS
1000.0000 mg | ORAL_TABLET | Freq: Once | ORAL | Status: AC
  Administered 2023-01-15: 1000 mg via ORAL
  Filled 2023-01-15: qty 2

## 2023-01-15 NOTE — ED Provider Notes (Signed)
[place] [facility] Provider Note   CSN: [id number] Arrival date & time: 01/15/23  1157     History  Chief Complaint  Patient presents with   Knee Pain    Ashley Franklin is a 52 y.o. female.  The history is provided by the patient and medical records. No language interpreter was used.  Knee Pain    52 year old female with significant history of chronic knee pain, bipolar disorder, obesity, presents today with complaint of knee injury.  Patient report yesterday she was at the gas station filling up gas.  In the process she was trying to clean her car and as she stepped over the gas lines she tripped, fell forward and strike both of her knees against the ground.  She denies hitting her head or loss of consciousness.  She was able to get up and walk.  She is not complaining of pain to both knees right greater than left.  She described pain as an achy stabbing sensation to the anterior knee worse with any bending movement about the knee.  She also experienced some "ground" sensation.  Pain is nonradiating there are no numbness no hip pain or ankle pain.  She did try OTC medication with minimal relief.  She is up-to-date with tetanus.  Home Medications Prior to Admission medications   Medication Sig Start Date End Date Taking? Authorizing Provider  amphetamine-dextroamphetamine (ADDERALL XR) 25 MG 24 hr capsule Take 1 capsule by mouth every morning. 01/12/23     Armodafinil 250 MG tablet Take 1 tablet (250 mg total) by mouth daily 11/29/21     Armodafinil 250 MG tablet Take 1 tablet (250 mg total) by mouth daily. 04/05/22     atorvastatin (LIPITOR) 20 MG tablet Take 1 tablet (20 mg total) by mouth at bedtime. 11/08/22     clonazePAM (KLONOPIN) 0.5 MG tablet Take 1 tablet (0.5 mg total) by mouth 2 (two) times daily as needed for anxiety. 01/19/23 02/18/23  [name], MD  FLUoxetine (PROZAC) 40 MG capsule Take 1 capsule (40 mg total) by mouth daily. 01/19/23  05/19/23  [name], MD  lamoTRIgine (LAMICTAL) 100 MG tablet Take 1 tablet (100 mg total) by mouth at bedtime. Please start after taking 75 mg at night for one week 01/07/23 03/08/23  [name], MD  lamoTRIgine (LAMICTAL) 200 MG tablet Take 1 tablet (200 mg total) by mouth daily. 12/18/22 05/17/23  [name], MD  lamoTRIgine (LAMICTAL) 25 MG tablet Take 2 tablets (50 mg total) by mouth daily. Dose uptitrated. 01/01/23 02/14/23  [name], MD  lubiprostone (AMITIZA) 24 MCG capsule Take 1 capsule (24 mcg total) by mouth 2 (two) times daily. 04/27/21   Jenel Lucks, MD  lurasidone (LATUDA) 80 MG TABS tablet Take 1 tablet (80 mg total) by mouth daily with breakfast. 12/18/22 03/18/23  [name], MD  omeprazole (PRILOSEC) 20 MG capsule Take 1 capsule (20 mg total) by mouth 2 (two) times daily before a meal. Best to take on an empty stomach. 07/14/22   Jenel Lucks, MD  pantoprazole (PROTONIX) 40 MG tablet Take 1 tablet (40 mg total) by mouth 2 (two) times daily. 01/12/23         Allergies    Nsaids    Review of Systems   Review of Systems  All other systems reviewed and are negative.   Physical Exam Updated Vital Signs BP (!) 133/109   Pulse 100   Temp 98.4 F (36.9  C) (Oral)   Resp 18   Ht 5\' 4"  (1.626 m)   Wt 108.9 kg   SpO2 99%   BMI 41.20 kg/m  Physical Exam Vitals and nursing note reviewed.  Constitutional:      General: She is not in acute distress.    Appearance: She is well-developed.  HENT:     Head: Atraumatic.  Eyes:     Conjunctiva/sclera: Conjunctivae normal.  Pulmonary:     Effort: Pulmonary effort is normal.  Musculoskeletal:        General: Tenderness (Right knee: Tenderness to anterior knee at the patella.  Patella is located.  Faint ecchymosis noted.  Increased pain with flexion but patient able to flex.  No joint laxity.  No deformity.) present.     Cervical back: Neck supple.     Comments: Left knee: Small abrasion noted to anterior knee  with mild tenderness but able to flex and extend.  Skin:    Findings: No rash.  Neurological:     Mental Status: She is alert.  Psychiatric:        Mood and Affect: Mood normal.     ED Results / Procedures / Treatments   Labs (all labs ordered are listed, but only abnormal results are displayed) Labs Reviewed - No data to display  EKG None  Radiology DG Knee Complete 4 Views Right  Result Date: 01/15/2023 CLINICAL DATA:  Trauma yesterday with pain EXAM: RIGHT KNEE - COMPLETE 4+ VIEW COMPARISON:  None Available. FINDINGS: No acute fracture or dislocation. Joint spaces maintained. No joint effusion. IMPRESSION: No acute osseous abnormality. Electronically Signed   By: Jeronimo Greaves M.D.   On: 01/15/2023 13:35    Procedures Procedures    Medications Ordered in ED Medications  acetaminophen (TYLENOL) tablet 1,000 mg (1,000 mg Oral Given 01/15/23 1432)    ED Course/ Medical Decision Making/ A&P                                 Medical Decision Making Amount and/or Complexity of Data Reviewed Radiology: ordered.   BP (!) 133/109   Pulse 100   Temp 98.4 F (36.9 C) (Oral)   Resp 18   Ht 5\' 4"  (1.626 m)   Wt 108.9 kg   SpO2 99%   BMI 41.20 kg/m   28:26 PM  52 year old female with significant history of chronic knee pain, bipolar disorder, obesity, presents today with complaint of knee injury.  Patient report yesterday she was at the gas station filling up gas.  In the process she was trying to clean her car and as she stepped over the gas lines she tripped, fell forward and strike both of her knees against the ground.  She denies hitting her head or loss of consciousness.  She was able to get up and walk.  She is not complaining of pain to both knees right greater than left.  She described pain as an achy stabbing sensation to the anterior knee worse with any bending movement about the knee.  She also experienced some "ground" sensation.  Pain is nonradiating there are no  numbness no hip pain or ankle pain.  She did try OTC medication with minimal relief.  On exam, patient is resting comfortably in bed appears to be in no acute discomfort.  Right knee is remarkable for point tenderness to the anterior knee at the patella with some faint bruising noted but no  obvious deformity.  No joint laxity.  Patella is located.  DDx: fracture, dislocation, strain, sprain, contusion  X-ray of right knee obtained independently viewed and interpreted by me without any acute abnormality.  Agree with radiologist interpretation.  Patient provided with Ace wrap and Tylenol for supportive care with improvement of symptoms.  Patient able to ambulate.  Plan to discharge home with supportive care, and orthopedic referral.  Return precaution given.  Social determinant of health includes depression.        Final Clinical Impression(s) / ED Diagnoses Final diagnoses:  Contusion of right knee, initial encounter    Rx / DC Orders ED Discharge Orders          Ordered    acetaminophen (TYLENOL) 500 MG tablet  Every 6 hours PRN        01/15/23 1435    cyclobenzaprine (FLEXERIL) 10 MG tablet  2 times daily PRN        01/15/23 1435              Fayrene Helper, PA-C 01/15/23 1435    Ernie Avena, MD 01/15/23 1520

## 2023-01-15 NOTE — ED Triage Notes (Signed)
Pt ambulatory to triage. Reports falling yesterday and injuring right knee. Grinding sound when bending knee Tripped over gas hose and fell on both knees

## 2023-01-15 NOTE — Discharge Instructions (Addendum)
You have been evaluated for your knee injury.  Fortunately x-ray did not show any concerning finding.  You are likely experiencing a bruising in your bone.  Sometime x-ray may miss internal injury such as ligament or meniscal injury.  If your symptoms persist, please follow-up closely with orthopedic specialist for further care.  Wear Ace wrap for support.

## 2023-01-16 ENCOUNTER — Other Ambulatory Visit (HOSPITAL_BASED_OUTPATIENT_CLINIC_OR_DEPARTMENT_OTHER): Payer: Self-pay

## 2023-01-17 ENCOUNTER — Other Ambulatory Visit (HOSPITAL_BASED_OUTPATIENT_CLINIC_OR_DEPARTMENT_OTHER): Payer: Self-pay

## 2023-01-19 ENCOUNTER — Other Ambulatory Visit (HOSPITAL_BASED_OUTPATIENT_CLINIC_OR_DEPARTMENT_OTHER): Payer: Self-pay

## 2023-02-01 ENCOUNTER — Other Ambulatory Visit (HOSPITAL_BASED_OUTPATIENT_CLINIC_OR_DEPARTMENT_OTHER): Payer: Self-pay

## 2023-02-01 MED ORDER — VALACYCLOVIR HCL 500 MG PO TABS
500.0000 mg | ORAL_TABLET | Freq: Two times a day (BID) | ORAL | 5 refills | Status: AC | PRN
  Filled 2023-02-01: qty 60, 30d supply, fill #0
  Filled 2023-03-02: qty 60, 30d supply, fill #1
  Filled 2023-04-11: qty 60, 30d supply, fill #2
  Filled 2023-05-07: qty 60, 30d supply, fill #3
  Filled 2023-08-31: qty 60, 30d supply, fill #4

## 2023-02-02 ENCOUNTER — Other Ambulatory Visit (HOSPITAL_BASED_OUTPATIENT_CLINIC_OR_DEPARTMENT_OTHER): Payer: Self-pay

## 2023-02-02 MED ORDER — ATORVASTATIN CALCIUM 20 MG PO TABS
20.0000 mg | ORAL_TABLET | Freq: Every day | ORAL | 0 refills | Status: DC
  Filled 2023-02-02 – 2023-02-05 (×2): qty 90, 90d supply, fill #0

## 2023-02-05 ENCOUNTER — Other Ambulatory Visit (HOSPITAL_BASED_OUTPATIENT_CLINIC_OR_DEPARTMENT_OTHER): Payer: Self-pay

## 2023-02-07 ENCOUNTER — Other Ambulatory Visit (HOSPITAL_BASED_OUTPATIENT_CLINIC_OR_DEPARTMENT_OTHER): Payer: Self-pay

## 2023-02-07 NOTE — Progress Notes (Signed)
Virtual Visit via Video Note  I connected with Ashley Franklin on 02/13/23 at 11:30 AM EDT by a video enabled telemedicine application and verified that I am speaking with the correct person using two identifiers.  Location: Patient: work Provider: office Persons participated in the visit- patient, provider    I discussed the limitations of evaluation and management by telemedicine and the availability of in person appointments. The patient expressed understanding and agreed to proceed.     I discussed the assessment and treatment plan with the patient. The patient was provided an opportunity to ask questions and all were answered. The patient agreed with the plan and demonstrated an understanding of the instructions.   The patient was advised to call back or seek an in-person evaluation if the symptoms worsen or if the condition fails to improve as anticipated.  I provided 20 minutes of non-face-to-face time during this encounter.   [name], MD    [facility] MD/PA/NP OP Progress Note  02/13/2023 12:15 PM Ashley Franklin  MRN:  [id number]  Chief Complaint:  Chief Complaint  Patient presents with   Follow-up   HPI:  This is a follow-up appointment for bipolar 2 disorder and anxiety.  She states that she has started to work for the past 3 weeks.  Although it is a fast pace, she has good support from work.  Although she wishes to keep up quicker, she feels comfortable at this time.  She states that her family has been doing good, although her brother's condition is still pending.  She sleeps several hours.  She sleeps well in the guest room.  Although she feels anxious at times, she does have a reason, although she used to feel anxious without any reason in the past.  She does not feel depressed.  She feels good about moving 14 pounds.  She denies SI.  She denies decreased need for sleep or euphoria.  She denies any side effects since uptitration of lamotrigine.  She takes clonazepam  several times per month, which has been a significant improvement.  She feels comfortable to stay on the current medication regimen at this time.   237 lbs Wt Readings from Last 3 Encounters:  01/15/23 240 lb (108.9 kg)  01/01/23 250 lb 12.8 oz (113.8 kg)  11/08/21 205 lb 9.6 oz (93.3 kg)     Visit Diagnosis:    ICD-10-CM   1. Bipolar affective disorder, currently depressed, mild (HCC)  F31.31     2. Anxiety state  F41.1       Past Psychiatric History: Please see initial evaluation for full details. I have reviewed the history. No updates at this time.     Past Medical History:  Past Medical History:  Diagnosis Date   Anemia    Anxiety    Bipolar affective disorder (HCC)    Blood transfusion without reported diagnosis    Bronchospasm with bronchitis, acute    Depression    Diabetes (HCC)    diet controlled   Kidney stone    Obesity    Right carpal tunnel syndrome 12/09/2018   SBO (small bowel obstruction) (HCC) 01/2014   Sleep apnea    uses CPAP    Past Surgical History:  Procedure Laterality Date   CARPAL TUNNEL RELEASE Right 08/05/2019   Procedure: RIGHT CARPAL TUNNEL RELEASE;  Surgeon: Cindee Salt, MD;  Location: [facility];  Service: Orthopedics;  Laterality: Right;  IV REGIONAL FOREARM BLOCK   CESAREAN SECTION  1994  ENDOMETRIAL ABLATION     GASTRIC BYPASS  2001   KIDNEY STONE SURGERY  2010   SBO  2015    Family Psychiatric History: Please see initial evaluation for full details. I have reviewed the history. No updates at this time.     Family History:  Family History  Problem Relation Age of Onset   Diabetes Mother    Cancer Mother 77       Throat    Bipolar disorder Mother    Kidney disease Mother    Heart disease Father    Bipolar disorder Sister    Bipolar disorder Sister    Bipolar disorder Brother    Drug abuse Brother    Colon cancer Maternal Aunt    Kidney disease Maternal Grandmother    Heart disease Paternal Grandfather     Cancer - Prostate Neg Hx    Breast cancer Neg Hx    Esophageal cancer Neg Hx    Rectal cancer Neg Hx    Stomach cancer Neg Hx     Social History:  Social History   Socioeconomic History   Marital status: Married    Spouse name: Not on file   Number of children: 2   Years of education: Not on file   Highest education level: Not on file  Occupational History   Occupation: office administor  Tobacco Use   Smoking status: Former    Current packs/day: 0.25    Average packs/day: 0.3 packs/day for 6.0 years (1.5 ttl pk-yrs)    Types: Cigarettes   Smokeless tobacco: Never   Tobacco comments:    1/2 ppd   Vaping Use   Vaping status: Never Used  Substance and Sexual Activity   Alcohol use: Yes    Alcohol/week: 0.0 standard drinks of alcohol    Comment: Two times a week.    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, music teacher   Right handed    Caffeine 2 cups daily    Social Determinants of Health   Financial Resource Strain: Not on file  Food Insecurity: Low Risk  (11/06/2022)   [facility]   Hunger Vital Sign    Worried About Running Out of Food in the Last Year: Never true    Ran Out of Food in the Last Year: Never true  Transportation Needs: Not on file (11/06/2022)  Physical Activity: Not on file  Stress: Not on file  Social Connections: Not on file    Allergies:  Allergies  Allergen Reactions   Nsaids Other (See Comments)    Contraindicated with gastric bypass    Metabolic Disorder Labs: Lab Results  Component Value Date   HGBA1C 4.9 02/06/2017   MPG 105 02/26/2015   No results  found for: "PROLACTIN" Lab Results  Component Value Date   CHOL 165 02/06/2017   TRIG 87 02/06/2017   HDL 54 02/06/2017   LDLCALC 94 02/06/2017   Lab Results  Component Value Date   TSH 1.12 07/10/2017   TSH 1.22 06/08/2016  Therapeutic Level Labs: No results found for: "LITHIUM" Lab Results  Component Value Date   VALPROATE 67 03/27/2022   VALPROATE 62 11/29/2020   No results found for: "CBMZ"  Current Medications: Current Outpatient Medications  Medication Sig Dispense Refill   acetaminophen (TYLENOL) 500 MG tablet Take 2 tablets (1,000 mg total) by mouth every 6 (six) hours as needed for moderate pain. 30 tablet 0   amphetamine-dextroamphetamine (ADDERALL XR) 25 MG 24 hr capsule Take 1 capsule by mouth every morning. 30 capsule 0   Armodafinil 250 MG tablet Take 1 tablet (250 mg total) by mouth daily 30 tablet 5   Armodafinil 250 MG tablet Take 1 tablet (250 mg total) by mouth daily. 30 tablet 5   atorvastatin (LIPITOR) 20 MG tablet Take 1 tablet (20 mg total) by mouth at bedtime. 90 tablet 0   [START ON 02/18/2023] clonazePAM (KLONOPIN) 0.5 MG tablet Take 1 tablet (0.5 mg total) by mouth daily as needed for anxiety. 30 tablet 1   cyclobenzaprine (FLEXERIL) 10 MG tablet Take 1 tablet (10 mg total) by mouth 2 (two) times daily as needed for muscle spasms. 20 tablet 0   FLUoxetine (PROZAC) 40 MG capsule Take 1 capsule (40 mg total) by mouth daily. 30 capsule 3   [START ON 03/08/2023] lamoTRIgine (LAMICTAL) 100 MG tablet Take 1 tablet (100 mg total) by mouth at bedtime. 30 tablet 1   lamoTRIgine (LAMICTAL) 200 MG tablet Take 1 tablet (200 mg total) by mouth daily. 30 tablet 4   lubiprostone (AMITIZA) 24 MCG capsule Take 1 capsule (24 mcg total) by mouth 2 (two) times daily. 120 capsule 3   lurasidone (LATUDA) 80 MG TABS tablet Take 1 tablet (80 mg total) by mouth daily with breakfast. 30 tablet 2   omeprazole (PRILOSEC) 20 MG capsule Take 1 capsule (20 mg total) by mouth 2  (two) times daily before a meal. Best to take on an empty stomach. 90 capsule 1   pantoprazole (PROTONIX) 40 MG tablet Take 1 tablet (40 mg total) by mouth 2 (two) times daily. 180 tablet 0   valACYclovir (VALTREX) 500 MG tablet Take 1 tablet (500 mg total) by mouth 2 (two) times daily for 3 days as needed for vulvar lesions. 60 tablet 5   No current facility-administered medications for this visit.     Musculoskeletal: Strength & Muscle Tone:  N/A Gait & Station:  N/A Patient leans: N/A  Psychiatric Specialty Exam: Review of Systems  Psychiatric/Behavioral:  Negative for agitation, behavioral problems, confusion, decreased concentration, dysphoric mood, hallucinations, self-injury, sleep disturbance and suicidal ideas. The patient is nervous/anxious. The patient is not hyperactive.   All other systems reviewed and are negative.   There were no vitals taken for this visit.There is no height or weight on file to calculate BMI.  General Appearance: Well Groomed  Eye Contact:  Good  Speech:  Clear and Coherent  Volume:  Normal  Mood:   good  Affect:  Appropriate, Congruent, and brighter, calm  Thought Process:  Coherent  Orientation:  Full (Time, Place, and Person)  Thought Content: Logical   Suicidal Thoughts:  No  Homicidal Thoughts:  No  Memory:  Immediate;   Good  Judgement:  Good  Insight:  Good  Psychomotor Activity:  Normal  Concentration:  Concentration: Good and Attention Span: Good  Recall:  Good  Fund of Knowledge: Good  Language: Good  Akathisia:  No  Handed:  Right  AIMS (if indicated): not done  Assets:  Communication Skills Desire for Improvement  ADL's:  Intact  Cognition: WNL  Sleep:  Good   Screenings: GAD-7    Flowsheet Row Office Visit from 01/01/2023 in [facility]  Total GAD-7 Score 19      PHQ2-9    Flowsheet Row Office Visit from 01/01/2023 in [facility]  Office Visit from 11/08/2021 in [facility] Video Visit from 06/03/2021 in [facility] Video Visit from 02/25/2021 in [facility] Office Visit from 11/29/2020 in [facility]  PHQ-2 Total Score 6 4 0 0 1  PHQ-9 Total Score 23 15 -- -- --      Flowsheet Row ED from 01/15/2023 in [facility] Office Visit from 01/01/2023 in [facility] Office Visit from 11/08/2021 in [facility]  C-SSRS RISK CATEGORY No Risk Error: Q3, 4, or 5 should not be populated when Q2 is No No Risk        Assessment and Plan:  TICHINA KOEBEL is a 52 y.o. year old female with a history of bipolar disorder, anxiety, anemia, obesity s/p gastric bypass surgery, who presents for follow up appointment for below.   1. Bipolar affective disorder, currently depressed, mild (HCC) 2. Anxiety state Acute stressors include: termination at work, loneliness, brother with substance use, who recently had MVA, her son, who had alcohol withdrawal/seizure Other stressors include: separation,  her mother with cancer in spine    History:Tx from Dr. Lolly Mustache. History of  impulsive sexually inappropriate behavior, irritability, anger, grandiosity when her husband left in 2010 Admitted in 2015 for depression. Originally on stelazine, trileptal 300 mg TID, fluoxetine 60 mg, amitriptyline 25 mg TID, valium 5 mg TID, Ambien 10 mg qhs    Exam is notable for brighter  affect, and there has been significant improvement in depressive symptoms and anxiety, which coincided with starting a new job, and uptitration of lamotrigine.  Will continue current dose of lamotrigine to target bipolar disorder.  Will continue Latuda for bipolar depression along with fluoxetine.  Although she  will greatly benefit from CBT, it will be difficult due to her starting the work.  Will hold this at this time.    # tremor R/o postural termor She has postural hand tremors.  Although she reports some shakiness in her leg, it was not noticeable during the exam.  Will continue to assess.     Plan:  Continue lamotrigine 200 mg daily, 100 mg at night  EKG: HR 85, QTc 454 msec, T wave inversion 11/2022 Continue Latuda 80 mg daily - akathisia from 100 mg. Continue fluoxetine 40 mg daily  Continue clonazepam 0.5 mg daily as needed for anxiety Next appointment: 11/27 at 9:30  - on Adderall XR 25 mg daily, armodafinil for narcolepsy   Past trials of medication: Lexapro, Paxil, amitriptyline, Abilify (irritable), oxcarbazepine, perphenazine, Trifluoperazine, Klonopin, Valium. Vistaril, Ambien    The patient demonstrates the following  risk factors for suicide: Chronic risk factors for suicide include psychiatric disorder /bipolar disorder, previous self-harm of scratching herself. Acute risk factors for suicide include none. Protective factors for this patient include positive social support, positive therapeutic relationship, hope for the future. Considering these factors, the overall suicide risk at this point appears to be moderate, but not imminent. Patient denies gun access. Discussed in detail safety plan  that anytime having active suicidal thoughts or homicidal thoughts and she need to call 911 or go to local emergency room.     Collaboration of Care: Collaboration of Care: Other reviewed notes in Epic  Patient/Guardian was advised Release of Information must be obtained prior to any record release in order to collaborate their care with an outside provider. Patient/Guardian was advised if they have not already done so to contact the registration department to sign all necessary forms in order for [place] to release information regarding their care.   Consent: Patient/Guardian gives verbal consent  for treatment and assignment of benefits for services provided during this visit. Patient/Guardian expressed understanding and agreed to proceed.    [name], MD 02/13/2023, 12:15 PM

## 2023-02-13 ENCOUNTER — Other Ambulatory Visit (HOSPITAL_BASED_OUTPATIENT_CLINIC_OR_DEPARTMENT_OTHER): Payer: Self-pay

## 2023-02-13 ENCOUNTER — Encounter: Payer: Self-pay | Admitting: Psychiatry

## 2023-02-13 ENCOUNTER — Telehealth (INDEPENDENT_AMBULATORY_CARE_PROVIDER_SITE_OTHER): Payer: Medicaid Other | Admitting: Psychiatry

## 2023-02-13 DIAGNOSIS — F3131 Bipolar disorder, current episode depressed, mild: Secondary | ICD-10-CM

## 2023-02-13 DIAGNOSIS — F411 Generalized anxiety disorder: Secondary | ICD-10-CM | POA: Diagnosis not present

## 2023-02-13 MED ORDER — CLONAZEPAM 0.5 MG PO TABS
0.5000 mg | ORAL_TABLET | Freq: Every day | ORAL | 1 refills | Status: DC | PRN
Start: 2023-02-18 — End: 2023-04-11
  Filled 2023-02-13: qty 30, 30d supply, fill #0
  Filled 2023-03-16: qty 30, 30d supply, fill #1

## 2023-02-13 MED ORDER — LAMOTRIGINE 100 MG PO TABS
100.0000 mg | ORAL_TABLET | Freq: Every day | ORAL | 1 refills | Status: DC
  Filled 2023-03-08: qty 30, 30d supply, fill #0
  Filled 2023-04-02: qty 30, 30d supply, fill #1

## 2023-02-13 NOTE — Patient Instructions (Signed)
Continue lamotrigine 200 mg daily, 100 mg at night   Continue Latuda 80 mg daily - akathisia from 100 mg. Continue fluoxetine 40 mg daily  Continue clonazepam 0.5 mg daily as needed for anxiety Next appointment: 11/27 at 9:30

## 2023-02-15 ENCOUNTER — Other Ambulatory Visit: Payer: Self-pay

## 2023-02-15 ENCOUNTER — Other Ambulatory Visit (HOSPITAL_BASED_OUTPATIENT_CLINIC_OR_DEPARTMENT_OTHER): Payer: Self-pay

## 2023-02-15 MED ORDER — AMPHETAMINE-DEXTROAMPHET ER 25 MG PO CP24
25.0000 mg | ORAL_CAPSULE | Freq: Every morning | ORAL | 0 refills | Status: DC
Start: 2023-02-15 — End: 2023-03-26
  Filled 2023-02-15 – 2023-03-02 (×2): qty 30, 30d supply, fill #0

## 2023-03-01 ENCOUNTER — Other Ambulatory Visit (HOSPITAL_BASED_OUTPATIENT_CLINIC_OR_DEPARTMENT_OTHER): Payer: Self-pay

## 2023-03-01 MED ORDER — AMPHETAMINE-DEXTROAMPHET ER 25 MG PO CP24
25.0000 mg | ORAL_CAPSULE | Freq: Every morning | ORAL | 0 refills | Status: DC
  Filled 2023-08-22: qty 30, 30d supply, fill #0

## 2023-03-01 MED ORDER — AMPHETAMINE-DEXTROAMPHET ER 25 MG PO CP24
25.0000 mg | ORAL_CAPSULE | Freq: Every morning | ORAL | 0 refills | Status: AC
Start: 2023-03-15 — End: ?
  Filled 2023-07-12: qty 30, 30d supply, fill #0

## 2023-03-01 MED ORDER — ARMODAFINIL 250 MG PO TABS
250.0000 mg | ORAL_TABLET | Freq: Every day | ORAL | 5 refills | Status: AC
Start: 2023-03-01 — End: ?
  Filled 2023-03-01: qty 30, 30d supply, fill #0

## 2023-03-01 MED ORDER — AMPHETAMINE-DEXTROAMPHET ER 25 MG PO CP24
25.0000 mg | ORAL_CAPSULE | Freq: Every morning | ORAL | 0 refills | Status: AC
  Filled 2023-06-12: qty 30, 30d supply, fill #0

## 2023-03-02 ENCOUNTER — Other Ambulatory Visit (HOSPITAL_BASED_OUTPATIENT_CLINIC_OR_DEPARTMENT_OTHER): Payer: Self-pay

## 2023-03-05 ENCOUNTER — Other Ambulatory Visit (HOSPITAL_BASED_OUTPATIENT_CLINIC_OR_DEPARTMENT_OTHER): Payer: Self-pay

## 2023-03-06 ENCOUNTER — Other Ambulatory Visit (HOSPITAL_BASED_OUTPATIENT_CLINIC_OR_DEPARTMENT_OTHER): Payer: Self-pay

## 2023-03-07 ENCOUNTER — Other Ambulatory Visit (HOSPITAL_BASED_OUTPATIENT_CLINIC_OR_DEPARTMENT_OTHER): Payer: Self-pay

## 2023-03-08 ENCOUNTER — Other Ambulatory Visit (HOSPITAL_BASED_OUTPATIENT_CLINIC_OR_DEPARTMENT_OTHER): Payer: Self-pay

## 2023-03-09 ENCOUNTER — Other Ambulatory Visit (HOSPITAL_BASED_OUTPATIENT_CLINIC_OR_DEPARTMENT_OTHER): Payer: Self-pay

## 2023-03-12 ENCOUNTER — Other Ambulatory Visit (HOSPITAL_BASED_OUTPATIENT_CLINIC_OR_DEPARTMENT_OTHER): Payer: Self-pay

## 2023-03-13 ENCOUNTER — Other Ambulatory Visit (HOSPITAL_BASED_OUTPATIENT_CLINIC_OR_DEPARTMENT_OTHER): Payer: Self-pay

## 2023-03-14 ENCOUNTER — Other Ambulatory Visit (HOSPITAL_BASED_OUTPATIENT_CLINIC_OR_DEPARTMENT_OTHER): Payer: Self-pay

## 2023-03-15 ENCOUNTER — Other Ambulatory Visit (HOSPITAL_BASED_OUTPATIENT_CLINIC_OR_DEPARTMENT_OTHER): Payer: Self-pay

## 2023-03-16 ENCOUNTER — Other Ambulatory Visit (HOSPITAL_BASED_OUTPATIENT_CLINIC_OR_DEPARTMENT_OTHER): Payer: Self-pay

## 2023-03-19 ENCOUNTER — Other Ambulatory Visit: Payer: Self-pay

## 2023-03-19 ENCOUNTER — Other Ambulatory Visit (HOSPITAL_BASED_OUTPATIENT_CLINIC_OR_DEPARTMENT_OTHER): Payer: Self-pay

## 2023-03-19 ENCOUNTER — Other Ambulatory Visit: Payer: Self-pay | Admitting: Psychiatry

## 2023-03-19 MED ORDER — LURASIDONE HCL 80 MG PO TABS
80.0000 mg | ORAL_TABLET | Freq: Every day | ORAL | 3 refills | Status: DC
  Filled 2023-03-19: qty 30, 30d supply, fill #0
  Filled 2023-04-26: qty 30, 30d supply, fill #1
  Filled 2023-05-28: qty 30, 30d supply, fill #2

## 2023-03-20 ENCOUNTER — Other Ambulatory Visit (HOSPITAL_BASED_OUTPATIENT_CLINIC_OR_DEPARTMENT_OTHER): Payer: Self-pay

## 2023-03-26 ENCOUNTER — Other Ambulatory Visit (HOSPITAL_BASED_OUTPATIENT_CLINIC_OR_DEPARTMENT_OTHER): Payer: Self-pay

## 2023-03-26 MED ORDER — AMPHETAMINE-DEXTROAMPHET ER 25 MG PO CP24
25.0000 mg | ORAL_CAPSULE | Freq: Every morning | ORAL | 0 refills | Status: DC
  Filled 2023-03-26 – 2023-04-11 (×2): qty 30, 30d supply, fill #0

## 2023-03-28 ENCOUNTER — Other Ambulatory Visit (HOSPITAL_BASED_OUTPATIENT_CLINIC_OR_DEPARTMENT_OTHER): Payer: Self-pay

## 2023-03-29 ENCOUNTER — Other Ambulatory Visit (HOSPITAL_BASED_OUTPATIENT_CLINIC_OR_DEPARTMENT_OTHER): Payer: Self-pay

## 2023-04-03 ENCOUNTER — Other Ambulatory Visit (HOSPITAL_BASED_OUTPATIENT_CLINIC_OR_DEPARTMENT_OTHER): Payer: Self-pay

## 2023-04-08 NOTE — Progress Notes (Unsigned)
Virtual Visit via Video Note  I connected with Ashley Franklin on 04/11/23 at  9:30 AM EST by a video enabled telemedicine application and verified that I am speaking with the correct person using two identifiers.  Location: Patient: car Provider: office Persons participated in the visit- patient, provider    I discussed the limitations of evaluation and management by telemedicine and the availability of in person appointments. The patient expressed understanding and agreed to proceed.    I discussed the assessment and treatment plan with the patient. The patient was provided an opportunity to ask questions and all were answered. The patient agreed with the plan and demonstrated an understanding of the instructions.   The patient was advised to call back or seek an in-person evaluation if the symptoms worsen or if the condition fails to improve as anticipated.  I provided 25 minutes of non-face-to-face time during this encounter.   Ashley Hotter, MD     [facility] MD/PA/NP OP Progress Note  04/11/2023 10:08 AM Ashley Franklin  MRN:  [id number]  Chief Complaint:  Chief Complaint  Patient presents with   Follow-up   HPI:  This is a follow-up appointment for bipolar disorder, anxiety.  She states that it has been 3 months since she started the job, and she is doing her best.  Her boss is not the nicest, and he tends to be extremely critical.  Although she is unsure, he will be retired in December. Other coworkers has been very encouraging.  She has not been talking with her parents as much so that they are not concerned about her.  Her children are doing good.  She is planning to see her family, including her sister and her father.  She is looking forward to being there, although she is concerned about 5 hours of drive.  She has occasional insomnia.  She feels good about weight loss since working on diet.  She thinks her anxiety has been worse, which also causes depression.  She states that  it is like when you ride a roller coaster, and it lasts. She cannot take breath during these episodes.  She denies SI.  She denies decreased need for sleep, euphoria or racing thoughts.  She asks if she can make adjustment in clonazepam to get over the next month.    229 lbs    Wt Readings from Last 3 Encounters:  01/15/23 240 lb (108.9 kg)  01/01/23 250 lb 12.8 oz (113.8 kg)  11/08/21 205 lb 9.6 oz (93.3 kg)    Visit Diagnosis:    ICD-10-CM   1. Bipolar affective disorder, currently depressed, mild (HCC)  F31.31     2. Anxiety state  F41.1       Past Psychiatric History: Please see initial evaluation for full details. I have reviewed the history. No updates at this time.     Past Medical History:  Past Medical History:  Diagnosis Date   Anemia    Anxiety    Bipolar affective disorder (HCC)    Blood transfusion without reported diagnosis    Bronchospasm with bronchitis, acute    Depression    Diabetes (HCC)    diet controlled   Kidney stone    Obesity    Right carpal tunnel syndrome 12/09/2018   SBO (small bowel obstruction) (HCC) 01/2014   Sleep apnea    uses CPAP    Past Surgical History:  Procedure Laterality Date   CARPAL TUNNEL RELEASE Right 08/05/2019   Procedure: RIGHT  CARPAL TUNNEL RELEASE;  Surgeon: Cindee Salt, MD;  Location: [facility];  Service: Orthopedics;  Laterality: Right;  IV REGIONAL FOREARM BLOCK   CESAREAN SECTION  1994   ENDOMETRIAL ABLATION     GASTRIC BYPASS  2001   KIDNEY STONE SURGERY  2010   SBO  2015    Family Psychiatric History: Please see initial evaluation for full details. I have reviewed the history. No updates at this time.     Family History:  Family History  Problem Relation Age of Onset   Diabetes Mother    Cancer Mother 43       Throat    Bipolar disorder Mother    Kidney disease Mother    Heart disease Father    Bipolar disorder Sister    Bipolar disorder Sister    Bipolar disorder Brother    Drug  abuse Brother    Colon cancer Maternal Aunt    Kidney disease Maternal Grandmother    Heart disease Paternal Grandfather    Cancer - Prostate Neg Hx    Breast cancer Neg Hx    Esophageal cancer Neg Hx    Rectal cancer Neg Hx    Stomach cancer Neg Hx     Social History:  Social History   Socioeconomic History   Marital status: Married    Spouse name: Not on file   Number of children: 2   Years of education: Not on file   Highest education level: Not on file  Occupational History   Occupation: office administor  Tobacco Use   Smoking status: Former    Current packs/day: 0.25    Average packs/day: 0.3 packs/day for 6.0 years (1.5 ttl pk-yrs)    Types: Cigarettes   Smokeless tobacco: Never   Tobacco comments:    1/2 ppd   Vaping Use   Vaping status: Never Used  Substance and Sexual Activity   Alcohol use: Yes    Alcohol/week: 0.0 standard drinks of alcohol    Comment: Two times a week.    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, music teacher   Right handed    Caffeine 2 cups daily    Social Determinants of Health   Financial Resource Strain: Not on file  Food Insecurity: Low Risk  (11/06/2022)   [facility]   Hunger Vital Sign    Worried About Running Out of Food in the Last Year: Never true    Ran Out of Food in the Last Year: Never true  Transportation Needs: Not on file (11/06/2022)  Physical Activity: Not on file  Stress: Not on file  Social Connections: Not on file    Allergies:  Allergies  Allergen Reactions   Nsaids Other (See Comments)    Contraindicated with gastric bypass     Metabolic Disorder Labs: Lab Results  Component Value Date   HGBA1C 4.9 02/06/2017   MPG 105 02/26/2015   No results found for: "PROLACTIN" Lab Results  Component Value Date   CHOL 165 02/06/2017  TRIG 87 02/06/2017   HDL 54 02/06/2017   LDLCALC 94 02/06/2017   Lab Results  Component Value Date   TSH 1.12 07/10/2017   TSH 1.22 06/08/2016    Therapeutic Level Labs: No results found for: "LITHIUM" Lab Results  Component Value Date   VALPROATE 67 03/27/2022   VALPROATE 62 11/29/2020   No results found for: "CBMZ"  Current Medications: Current Outpatient Medications  Medication Sig Dispense Refill   acetaminophen (TYLENOL) 500 MG tablet Take 2 tablets (1,000 mg total) by mouth every 6 (six) hours as needed for moderate pain. 30 tablet 0   [START ON 04/14/2023] amphetamine-dextroamphetamine (ADDERALL XR) 25 MG 24 hr capsule Take 1 capsule (25 mg total) by mouth every morning. 30 capsule 0   amphetamine-dextroamphetamine (ADDERALL XR) 25 MG 24 hr capsule Take 1 capsule (25 mg total) by mouth every morning. 30 capsule 0   [START ON 05/11/2023] amphetamine-dextroamphetamine (ADDERALL XR) 25 MG 24 hr capsule Take 1 capsule (25 mg total) by mouth every morning. 30 capsule 0   amphetamine-dextroamphetamine (ADDERALL XR) 25 MG 24 hr capsule Take 1 capsule by mouth every morning. 30 capsule 0   Armodafinil 250 MG tablet Take 1 tablet (250 mg total) by mouth daily 30 tablet 5   Armodafinil 250 MG tablet Take 1 tablet (250 mg total) by mouth daily. 30 tablet 5   Armodafinil 250 MG tablet Take 1 tablet (250 mg total) by mouth daily. 30 tablet 5   atorvastatin (LIPITOR) 20 MG tablet Take 1 tablet (20 mg total) by mouth at bedtime. 90 tablet 0   clonazePAM (KLONOPIN) 0.5 MG tablet Take 1 tablet (0.5 mg total) by mouth 2 (two) times daily as needed for anxiety. 30 tablet 1   cyclobenzaprine (FLEXERIL) 10 MG tablet Take 1 tablet (10 mg total) by mouth 2 (two) times daily as needed for  muscle spasms. 20 tablet 0   FLUoxetine (PROZAC) 40 MG capsule Take 1 capsule (40 mg total) by mouth daily. 30 capsule 3   [START ON 05/08/2023] lamoTRIgine (LAMICTAL) 100 MG tablet Take 1 tablet (100 mg total) by mouth at bedtime. 30 tablet 1   lamoTRIgine (LAMICTAL) 200 MG tablet Take 1 tablet (200 mg total) by mouth daily. 30 tablet 4   lubiprostone (AMITIZA) 24 MCG capsule Take 1 capsule (24 mcg total) by mouth 2 (two) times daily. 120 capsule 3   lurasidone (LATUDA) 80 MG TABS tablet Take 1 tablet (80 mg total) by mouth daily with breakfast. 30 tablet 3   omeprazole (PRILOSEC) 20 MG capsule Take 1 capsule (20 mg total) by mouth 2 (two) times daily before a meal. Best to take on an empty stomach. 90 capsule 1   pantoprazole (PROTONIX) 40 MG tablet Take 1 tablet (40 mg total) by mouth 2 (two) times daily. 180 tablet 0   valACYclovir (VALTREX) 500 MG tablet Take 1 tablet (500 mg total) by mouth 2 (two) times daily for 3 days as needed for vulvar lesions. 60 tablet 5   No current facility-administered medications for this visit.     Musculoskeletal: Strength & Muscle Tone:  N/A Gait & Station:  N/A Patient leans: N/A  Psychiatric Specialty Exam: Review of Systems  Psychiatric/Behavioral:  Positive for dysphoric mood and sleep disturbance. Negative for agitation, behavioral problems, confusion, decreased concentration, hallucinations, self-injury and suicidal ideas. The patient is nervous/anxious. The patient is not hyperactive.   All other systems reviewed and are negative.   There were no vitals taken for this  visit.There is no height or weight on file to calculate BMI.  General Appearance: Well Groomed  Eye Contact:  Good  Speech:  Clear and Coherent  Volume:  Normal  Mood:  Anxious and Depressed  Affect:  Appropriate, Congruent, and Tearful  Thought Process:  Coherent  Orientation:  Full (Time, Place, and Person)  Thought Content: Logical   Suicidal Thoughts:  No  Homicidal  Thoughts:  No  Memory:  Immediate;   Good  Judgement:  Good  Insight:  Good  Psychomotor Activity:  Normal  Concentration:  Concentration: Good and Attention Span: Good  Recall:  Good  Fund of Knowledge: Good  Language: Good  Akathisia:  No  Handed:  Right  AIMS (if indicated): not done  Assets:  Communication Skills Desire for Improvement  ADL's:  Intact  Cognition: WNL  Sleep:  Poor   Screenings: GAD-7    Flowsheet Row Office Visit from 01/01/2023 in [facility]  Total GAD-7 Score 19      PHQ2-9    Flowsheet Row Office Visit from 01/01/2023 in [facility] Office Visit from 11/08/2021 in [facility] Video Visit from 06/03/2021 in [facility] Video Visit from 02/25/2021 in [facility] Office Visit from 11/29/2020 in [facility]  PHQ-2 Total Score 6 4 0 0 1  PHQ-9 Total Score 23 15 -- -- --      Flowsheet Row ED from 01/15/2023 in [facility] Office Visit from 01/01/2023 in [facility] Office Visit from 11/08/2021 in [facility]  C-SSRS RISK CATEGORY No Risk Error: Q3, 4, or 5 should not be populated when Q2 is No No Risk        Assessment and Plan:  NAYLIN PERAZZO is a 52 y.o. year old female with a history of bipolar disorder, anxiety, anemia, obesity s/p gastric bypass surgery, who presents for follow up appointment for below.   1. Bipolar affective disorder, currently depressed, mild (HCC) 2. Anxiety state Acute stressors include: termination at work, loneliness, brother with substance use, who recently had MVA, her son, who had alcohol withdrawal/seizure Other stressors include: separation,  her mother with  cancer in spine    History:Tx from Dr. Lolly Mustache. History of  impulsive sexually inappropriate behavior, irritability, anger, grandiosity when her husband left in 2010 Admitted in 2015 for depression. Originally on stelazine, trileptal 300 mg TID, fluoxetine 60 mg, amitriptyline 25 mg TID, valium 5 mg TID, Ambien 10 mg qhs     Exam is notable for tearful affect, and she reports worsening in anxiety, depression in the setting of starting a new job/conflict with her boss.  Will have additional clonazepam prn at this time given this boss is supposed to be retired after months.  She expressed understanding that other medication will be adjusted if she were to continue to experience anxiety given there is concern of long-term risk of using clonazepam.  Will continue lamotrigine to target bipolar disorder.  Will continue Latuda for bipolar depression along with paroxetine. Although she will greatly benefit from CBT, it will be difficult due to her starting the work.  Will hold this at this time.    # tremor R/o postural termor She has postural hand tremors.  Although she reports some shakiness in her leg, it was not noticeable during  the exam.  Will continue to assess.     Plan:  Continue lamotrigine 200 mg daily, 100 mg at night  EKG: HR 85, QTc 454 msec, T wave inversion 11/2022 Continue Latuda 80 mg daily - akathisia from 100 mg. Continue fluoxetine 40 mg daily  Increase clonazepam 0.5 mg twice as needed for anxiety- she agreed to taper down at the next visit Next appointment: 1/24 at 8 am, video   - on Adderall XR 25 mg daily, armodafinil for narcolepsy   Past trials of medication: Lexapro, Paxil, amitriptyline, Abilify (irritable), oxcarbazepine, perphenazine, Trifluoperazine, Klonopin, Valium. Vistaril, Ambien    The patient demonstrates the following  risk factors for suicide: Chronic risk factors for suicide include psychiatric disorder /bipolar disorder, previous self-harm of scratching herself.  Acute risk factors for suicide include none. Protective factors for this patient include positive social support, positive therapeutic relationship, hope for the future. Considering these factors, the overall suicide risk at this point appears to be moderate, but not imminent. Patient denies gun access. Discussed in detail safety plan that anytime having active suicidal thoughts or homicidal thoughts and she need to call 911 or go to local emergency room.     Collaboration of Care: Collaboration of Care: Other reviewed notes in Epic  Patient/Guardian was advised Release of Information must be obtained prior to any record release in order to collaborate their care with an outside provider. Patient/Guardian was advised if they have not already done so to contact the registration department to sign all necessary forms in order for [place] to release information regarding their care.   Consent: Patient/Guardian gives verbal consent for treatment and assignment of benefits for services provided during this visit. Patient/Guardian expressed understanding and agreed to proceed.    Ashley Hotter, MD 04/11/2023, 10:08 AM

## 2023-04-11 ENCOUNTER — Encounter: Payer: Self-pay | Admitting: Psychiatry

## 2023-04-11 ENCOUNTER — Other Ambulatory Visit: Payer: Self-pay

## 2023-04-11 ENCOUNTER — Other Ambulatory Visit (HOSPITAL_BASED_OUTPATIENT_CLINIC_OR_DEPARTMENT_OTHER): Payer: Self-pay

## 2023-04-11 ENCOUNTER — Telehealth: Payer: Medicaid Other | Admitting: Psychiatry

## 2023-04-11 DIAGNOSIS — F3131 Bipolar disorder, current episode depressed, mild: Secondary | ICD-10-CM

## 2023-04-11 DIAGNOSIS — F411 Generalized anxiety disorder: Secondary | ICD-10-CM

## 2023-04-11 MED ORDER — LAMOTRIGINE 100 MG PO TABS
100.0000 mg | ORAL_TABLET | Freq: Every day | ORAL | 1 refills | Status: DC
  Filled 2023-05-08: qty 30, 30d supply, fill #0

## 2023-04-11 MED ORDER — CLONAZEPAM 0.5 MG PO TABS
0.5000 mg | ORAL_TABLET | Freq: Two times a day (BID) | ORAL | 1 refills | Status: DC | PRN
Start: 2023-04-11 — End: 2023-06-08
  Filled 2023-04-11: qty 30, 15d supply, fill #0
  Filled 2023-05-25: qty 30, 15d supply, fill #1

## 2023-04-16 ENCOUNTER — Other Ambulatory Visit (HOSPITAL_BASED_OUTPATIENT_CLINIC_OR_DEPARTMENT_OTHER): Payer: Self-pay

## 2023-04-16 ENCOUNTER — Other Ambulatory Visit (HOSPITAL_COMMUNITY): Payer: Self-pay

## 2023-04-16 MED ORDER — PANTOPRAZOLE SODIUM 40 MG PO TBEC
40.0000 mg | DELAYED_RELEASE_TABLET | Freq: Two times a day (BID) | ORAL | 0 refills | Status: DC
  Filled 2023-04-16: qty 180, 90d supply, fill #0

## 2023-04-20 ENCOUNTER — Other Ambulatory Visit (HOSPITAL_BASED_OUTPATIENT_CLINIC_OR_DEPARTMENT_OTHER): Payer: Self-pay

## 2023-05-01 ENCOUNTER — Other Ambulatory Visit (HOSPITAL_BASED_OUTPATIENT_CLINIC_OR_DEPARTMENT_OTHER): Payer: Self-pay

## 2023-05-02 ENCOUNTER — Other Ambulatory Visit (HOSPITAL_BASED_OUTPATIENT_CLINIC_OR_DEPARTMENT_OTHER): Payer: Self-pay

## 2023-05-02 MED ORDER — WEGOVY 0.25 MG/0.5ML ~~LOC~~ SOAJ
0.2500 mg | SUBCUTANEOUS | 0 refills | Status: DC
  Filled 2023-05-02: qty 2, 28d supply, fill #0

## 2023-05-07 ENCOUNTER — Other Ambulatory Visit (HOSPITAL_BASED_OUTPATIENT_CLINIC_OR_DEPARTMENT_OTHER): Payer: Self-pay

## 2023-05-08 ENCOUNTER — Other Ambulatory Visit (HOSPITAL_BASED_OUTPATIENT_CLINIC_OR_DEPARTMENT_OTHER): Payer: Self-pay

## 2023-05-10 ENCOUNTER — Other Ambulatory Visit (HOSPITAL_BASED_OUTPATIENT_CLINIC_OR_DEPARTMENT_OTHER): Payer: Self-pay

## 2023-05-10 MED ORDER — ATORVASTATIN CALCIUM 20 MG PO TABS
20.0000 mg | ORAL_TABLET | Freq: Every day | ORAL | 0 refills | Status: DC
  Filled 2023-05-10: qty 90, 90d supply, fill #0

## 2023-05-25 ENCOUNTER — Other Ambulatory Visit (HOSPITAL_BASED_OUTPATIENT_CLINIC_OR_DEPARTMENT_OTHER): Payer: Self-pay

## 2023-05-28 ENCOUNTER — Other Ambulatory Visit (HOSPITAL_BASED_OUTPATIENT_CLINIC_OR_DEPARTMENT_OTHER): Payer: Self-pay

## 2023-05-28 ENCOUNTER — Other Ambulatory Visit: Payer: Self-pay | Admitting: Psychiatry

## 2023-05-28 ENCOUNTER — Other Ambulatory Visit: Payer: Self-pay

## 2023-05-28 MED ORDER — LAMOTRIGINE 200 MG PO TABS
200.0000 mg | ORAL_TABLET | Freq: Every day | ORAL | 4 refills | Status: DC
  Filled 2023-05-28 (×2): qty 30, 30d supply, fill #0
  Filled 2023-06-25: qty 30, 30d supply, fill #1
  Filled 2023-07-25: qty 30, 30d supply, fill #2
  Filled 2023-08-24: qty 30, 30d supply, fill #3
  Filled 2023-09-24: qty 30, 30d supply, fill #4

## 2023-05-28 MED ORDER — FLUOXETINE HCL 40 MG PO CAPS
40.0000 mg | ORAL_CAPSULE | Freq: Every day | ORAL | 4 refills | Status: DC
  Filled 2023-05-28: qty 30, 30d supply, fill #0
  Filled 2023-06-25: qty 30, 30d supply, fill #1
  Filled 2023-07-25: qty 30, 30d supply, fill #2
  Filled 2023-08-24: qty 30, 30d supply, fill #3
  Filled 2023-09-24: qty 30, 30d supply, fill #4

## 2023-05-30 ENCOUNTER — Other Ambulatory Visit (HOSPITAL_BASED_OUTPATIENT_CLINIC_OR_DEPARTMENT_OTHER): Payer: Self-pay

## 2023-05-30 MED ORDER — WEGOVY 0.5 MG/0.5ML ~~LOC~~ SOAJ
0.5000 mg | SUBCUTANEOUS | 0 refills | Status: DC
  Filled 2023-05-30: qty 2, 28d supply, fill #0

## 2023-06-02 NOTE — Progress Notes (Signed)
Virtual Visit via Video Note  I connected with Ashley Franklin on 06/08/23 at  8:00 AM EST by a video enabled telemedicine application and verified that I am speaking with the correct person using two identifiers.  Location: Patient: car Provider: office Persons participated in the visit- patient, provider    I discussed the limitations of evaluation and management by telemedicine and the availability of in person appointments. The patient expressed understanding and agreed to proceed.    I discussed the assessment and treatment plan with the patient. The patient was provided an opportunity to ask questions and all were answered. The patient agreed with the plan and demonstrated an understanding of the instructions.   The patient was advised to call back or seek an in-person evaluation if the symptoms worsen or if the condition fails to improve as anticipated.  I provided 30 minutes of non-face-to-face time during this encounter.   [name], MD    [facility] MD/PA/NP OP Progress Note  06/08/2023 10:13 AM Ashley Franklin  MRN:  [id number]  Chief Complaint:  Chief Complaint  Patient presents with   Follow-up   HPI:  - since the last visit, she was seen by PCP 04/2023. She is found to have iron deficiency, vitamin D deficiency.  BP 124/70  Pulse 69  Wt 109 kg (239 lb 9.6 oz)  SpO2 97%  BMI 40.49 kg/m   This is a follow-up appointment for bipolar disorder and insomnia.  She states that the work has been very tough.  The boss is aggressive. He postponed his retirement and it is not scheduled in March.  There was an incident of him getting very aggressive, and she wrote a letter to inform that she does not feel comfortable with this.  He has gotten very angry with this, and made her write an apology letter.  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 although she thinks the relationship has gotten worse, she was able to stand up for herself.  She is applying for another job.  She  states that the weekends are great, having less anxiety.  However, she continues to think about the work.  She has insomnia.  She has decrease in appetite since being on Wegovy for a month.  She denies SI.  She denies decreased need for sleep or euphoria.  She has been taking clonazepam every day for anxiety, and would like to continue the same dose for now.  She agrees with the plan as outlined below.   216 lbs Wt Readings from Last 3 Encounters:  01/15/23 240 lb (108.9 kg)  01/01/23 250 lb 12.8 oz (113.8 kg)  11/08/21 205 lb 9.6 oz (93.3 kg)     Visit Diagnosis:    ICD-10-CM   1. Bipolar affective disorder, currently depressed, moderate (HCC)  F31.32     2. Anxiety state  F41.1       Past Psychiatric History: Please see initial evaluation for full details. I have reviewed the history. No updates at this time.     Past Medical History:  Past Medical History:  Diagnosis Date   Anemia    Anxiety    Bipolar affective disorder (HCC)    Blood transfusion without reported diagnosis    Bronchospasm with bronchitis, acute    Depression    Diabetes (HCC)    diet controlled   Kidney stone    Obesity    Right carpal tunnel syndrome 12/09/2018   SBO (small bowel obstruction) (HCC)  01/2014   Sleep apnea    uses CPAP    Past Surgical History:  Procedure Laterality Date   CARPAL TUNNEL RELEASE Right 08/05/2019   Procedure: RIGHT CARPAL TUNNEL RELEASE;  Surgeon: Cindee Salt, MD;  Location: [facility];  Service: Orthopedics;  Laterality: Right;  IV REGIONAL FOREARM BLOCK   CESAREAN SECTION  1994   ENDOMETRIAL ABLATION     GASTRIC BYPASS  2001   KIDNEY STONE SURGERY  2010   SBO  2015    Family Psychiatric History: Please see initial evaluation for full details. I have reviewed the history. No updates at this time.     Family History:  Family History  Problem Relation Age of Onset   Diabetes Mother    Cancer Mother 74       Throat    Bipolar disorder Mother     Kidney disease Mother    Heart disease Father    Bipolar disorder Sister    Bipolar disorder Sister    Bipolar disorder Brother    Drug abuse Brother    Colon cancer Maternal Aunt    Kidney disease Maternal Grandmother    Heart disease Paternal Grandfather    Cancer - Prostate Neg Hx    Breast cancer Neg Hx    Esophageal cancer Neg Hx    Rectal cancer Neg Hx    Stomach cancer Neg Hx     Social History:  Social History   Socioeconomic History   Marital status: Married    Spouse name: Not on file   Number of children: 2   Years of education: Not on file   Highest education level: Not on file  Occupational History   Occupation: office administor  Tobacco Use   Smoking status: Former    Current packs/day: 0.25    Average packs/day: 0.3 packs/day for 6.0 years (1.5 ttl pk-yrs)    Types: Cigarettes   Smokeless tobacco: Never   Tobacco comments:    1/2 ppd   Vaping Use   Vaping status: Never Used  Substance and Sexual Activity   Alcohol use: Yes    Alcohol/week: 0.0 standard drinks of alcohol    Comment: Two times a week.    Drug use: No   Sexual activity: Yes    Partners: Male    Birth control/protection: Surgical  Other Topics Concern   Not on file  Social History Narrative      1 son born 1988-08-13 daughter born in 3 she is married and lives with her husband   Prior smoker not current 3 caffeinated beverages a day no alcohol tobacco or drug use      2 independent Acupuncturist schools, music teacher   Right handed    Caffeine 2 cups daily    Social Drivers of Corporate investment banker Strain: Not on file  Food Insecurity: Low Risk  (11/06/2022)   [facility]   [facility] Vital Sign    Worried About Running Out of Food in the Last Year: Never true    Ran Out of Food in the Last Year: Never true  Transportation Needs: Not on file (11/06/2022)  Physical Activity: Not on file  Stress: Not on file  Social Connections:  Not on file    Allergies:  Allergies  Allergen Reactions   Nsaids Other (See Comments)    Contraindicated with gastric bypass    Metabolic Disorder Labs: Lab Results  Component Value  Date   HGBA1C 4.9 02/06/2017   MPG 105 02/26/2015   No results found for: "PROLACTIN" Lab Results  Component Value Date   CHOL 165 02/06/2017   TRIG 87 02/06/2017   HDL 54 02/06/2017   LDLCALC 94 02/06/2017   Lab Results  Component Value Date   TSH 1.12 07/10/2017   TSH 1.22 06/08/2016    Therapeutic Level Labs: No results found for: "LITHIUM" Lab Results  Component Value Date   VALPROATE 67 03/27/2022   VALPROATE 62 11/29/2020   No results found for: "CBMZ"  Current Medications: Current Outpatient Medications  Medication Sig Dispense Refill   L-Methylfolate 7.5 MG TABS Take 1 tablet (7.5 mg total) by mouth daily. 30 tablet 1   acetaminophen (TYLENOL) 500 MG tablet Take 2 tablets (1,000 mg total) by mouth every 6 (six) hours as needed for moderate pain. 30 tablet 0   amphetamine-dextroamphetamine (ADDERALL XR) 25 MG 24 hr capsule Take 1 capsule (25 mg total) by mouth every morning. 30 capsule 0   amphetamine-dextroamphetamine (ADDERALL XR) 25 MG 24 hr capsule Take 1 capsule (25 mg total) by mouth every morning. 30 capsule 0   amphetamine-dextroamphetamine (ADDERALL XR) 25 MG 24 hr capsule Take 1 capsule (25 mg total) by mouth every morning. 30 capsule 0   amphetamine-dextroamphetamine (ADDERALL XR) 25 MG 24 hr capsule Take 1 capsule by mouth every morning. 30 capsule 0   Armodafinil 250 MG tablet Take 1 tablet (250 mg total) by mouth daily 30 tablet 5   Armodafinil 250 MG tablet Take 1 tablet (250 mg total) by mouth daily. 30 tablet 5   Armodafinil 250 MG tablet Take 1 tablet (250 mg total) by mouth daily. 30 tablet 5   atorvastatin (LIPITOR) 20 MG tablet Take 1 tablet (20 mg total) by mouth at bedtime. 90 tablet 0   clonazePAM (KLONOPIN) 0.5 MG tablet Take 1 tablet (0.5 mg total) by  mouth 2 (two) times daily as needed for anxiety. 60 tablet 1   cyclobenzaprine (FLEXERIL) 10 MG tablet Take 1 tablet (10 mg total) by mouth 2 (two) times daily as needed for muscle spasms. 20 tablet 0   FLUoxetine (PROZAC) 40 MG capsule Take 1 capsule (40 mg total) by mouth daily. 30 capsule 4   [START ON 07/07/2023] lamoTRIgine (LAMICTAL) 100 MG tablet Take 1 tablet (100 mg total) by mouth at bedtime. 30 tablet 1   lamoTRIgine (LAMICTAL) 200 MG tablet Take 1 tablet (200 mg total) by mouth daily. 30 tablet 4   lubiprostone (AMITIZA) 24 MCG capsule Take 1 capsule (24 mcg total) by mouth 2 (two) times daily. 120 capsule 3   [START ON 07/22/2023] lurasidone (LATUDA) 80 MG TABS tablet Take 1 tablet (80 mg total) by mouth daily with breakfast. 30 tablet 3   omeprazole (PRILOSEC) 20 MG capsule Take 1 capsule (20 mg total) by mouth 2 (two) times daily before a meal. Best to take on an empty stomach. 90 capsule 1   Semaglutide-Weight Management (WEGOVY) 0.5 MG/0.5ML SOAJ Inject 0.5 mg into the skin once a week. 2 mL 0   valACYclovir (VALTREX) 500 MG tablet Take 1 tablet (500 mg total) by mouth 2 (two) times daily for 3 days as needed for vulvar lesions. 60 tablet 5   No current facility-administered medications for this visit.     Musculoskeletal: Strength & Muscle Tone:  N/A Gait & Station:  N/A Patient leans: N/A  Psychiatric Specialty Exam: Review of Systems  Psychiatric/Behavioral:  Positive for dysphoric  mood and sleep disturbance. Negative for agitation, behavioral problems, confusion, decreased concentration, hallucinations, self-injury and suicidal ideas. The patient is nervous/anxious. The patient is not hyperactive.   All other systems reviewed and are negative.   There were no vitals taken for this visit.There is no height or weight on file to calculate BMI.  General Appearance: Well Groomed  Eye Contact:  Good  Speech:  Clear and Coherent  Volume:  Normal  Mood:  Anxious  Affect:   Appropriate, Congruent, and slightly down  Thought Process:  Coherent  Orientation:  Full (Time, Place, and Person)  Thought Content: Logical   Suicidal Thoughts:  No  Homicidal Thoughts:  No  Memory:  Immediate;   Good  Judgement:  Good  Insight:  Good  Psychomotor Activity:  Normal  Concentration:  Concentration: Good and Attention Span: Good  Recall:  Good  Fund of Knowledge: Good  Language: Good  Akathisia:  No  Handed:  Right  AIMS (if indicated): not done  Assets:  Communication Skills Desire for Improvement  ADL's:  Intact  Cognition: WNL  Sleep:  Poor   Screenings: GAD-7    Flowsheet Row Office Visit from 01/01/2023 in [facility]  Total GAD-7 Score 19      PHQ2-9    Flowsheet Row Office Visit from 01/01/2023 in [facility] Office Visit from 11/08/2021 in [facility] Video Visit from 06/03/2021 in [facility] Video Visit from 02/25/2021 in [facility] Office Visit from 11/29/2020 in [facility]  PHQ-2 Total Score 6 4 0 0 1  PHQ-9 Total Score 23 15 -- -- --      Flowsheet Row ED from 01/15/2023 in [facility] Office Visit from 01/01/2023 in [facility] Office Visit from 11/08/2021 in [facility]  C-SSRS RISK CATEGORY No Risk Error: Q3, 4, or 5 should not be populated when Q2 is No No Risk        Assessment and Plan:  Ashley Franklin is a 53 y.o. year old female with a history of bipolar disorder, anxiety, anemia, obesity s/p gastric bypass surgery, who presents for follow up appointment for below.   1. Bipolar affective disorder, currently depressed, moderate (HCC) 2. Anxiety state Acute stressors  include: termination at work, loneliness, brother with substance use, who recently had MVA, her son, who had alcohol withdrawal/seizure Other stressors include: separation,  her mother with cancer in spine    History:Tx from Dr. Lolly Mustache. History of  impulsive sexually inappropriate behavior, irritability, anger, grandiosity when her husband left in 2010 Admitted in 2015 for depression. Originally on stelazine, trileptal 300 mg TID, fluoxetine 60 mg, amitriptyline 25 mg TID, valium 5 mg TID, Ambien 10 mg qhs   There has been significant worsening in the context of work related stress/interaction with her boss.  Will start L-methylfolate given her history of status post gastric bypass surgery, and now with appetite loss since being on Wegovy.  She is also reminded to eat at least 350 kcal prior to taking Latuda to maximize its effectiveness.  Will continue other medication for now.  Will continue Latuda for bipolar depression along with lamotrigine.  Will continue fluoxetine for depression and anxiety, and clonazepam as needed for anxiety. Although she will greatly benefit from CBT, it will be difficult due  to her starting the work.  Will hold this at this time.    # tremor R/o postural termor She has postural hand tremors.  Although she reports some shakiness in her leg, it was not noticeable during the previous exam.  Will continue to assess.     Plan:  Continue lamotrigine 200 mg daily, 100 mg at night  EKG: HR 85, QTc 454 msec, T wave inversion 11/2022 Continue Latuda 80 mg daily - akathisia from 100 mg. Lipid 04/2023 Continue fluoxetine 40 mg daily  Start L-methyolfolate 7.5 mg daily  Continue clonazepam 0.5 mg twice as needed for anxiety Next appointment:3/25 at 8 am, video   - on Adderall XR 25 mg daily, armodafinil for narcolepsy   Past trials of medication: Lexapro, Paxil, amitriptyline, Abilify (irritable), oxcarbazepine, perphenazine, Trifluoperazine, Klonopin, Valium. Vistaril, Ambien     The patient demonstrates the following  risk factors for suicide: Chronic risk factors for suicide include psychiatric disorder /bipolar disorder, previous self-harm of scratching herself. Acute risk factors for suicide include none. Protective factors for this patient include positive social support, positive therapeutic relationship, hope for the future. Considering these factors, the overall suicide risk at this point appears to be moderate, but not imminent. Patient denies gun access. Discussed in detail safety plan that anytime having active suicidal thoughts or homicidal thoughts and she need to call 911 or go to local emergency room.     Collaboration of Care: Collaboration of Care: Other reviewed notes in Epic  Patient/Guardian was advised Release of Information must be obtained prior to any record release in order to collaborate their care with an outside provider. Patient/Guardian was advised if they have not already done so to contact the registration department to sign all necessary forms in order for [place] to release information regarding their care.   Consent: Patient/Guardian gives verbal consent for treatment and assignment of benefits for services provided during this visit. Patient/Guardian expressed understanding and agreed to proceed.    [name], MD 06/08/2023, 10:13 AM

## 2023-06-08 ENCOUNTER — Telehealth (INDEPENDENT_AMBULATORY_CARE_PROVIDER_SITE_OTHER): Payer: Medicaid Other | Admitting: Psychiatry

## 2023-06-08 ENCOUNTER — Encounter (HOSPITAL_BASED_OUTPATIENT_CLINIC_OR_DEPARTMENT_OTHER): Payer: Self-pay

## 2023-06-08 ENCOUNTER — Other Ambulatory Visit (HOSPITAL_BASED_OUTPATIENT_CLINIC_OR_DEPARTMENT_OTHER): Payer: Self-pay

## 2023-06-08 ENCOUNTER — Other Ambulatory Visit: Payer: Self-pay

## 2023-06-08 ENCOUNTER — Encounter: Payer: Self-pay | Admitting: Psychiatry

## 2023-06-08 DIAGNOSIS — F411 Generalized anxiety disorder: Secondary | ICD-10-CM

## 2023-06-08 DIAGNOSIS — F3132 Bipolar disorder, current episode depressed, moderate: Secondary | ICD-10-CM

## 2023-06-08 MED ORDER — CLONAZEPAM 0.5 MG PO TABS
0.5000 mg | ORAL_TABLET | Freq: Two times a day (BID) | ORAL | 1 refills | Status: DC | PRN
  Filled 2023-06-08: qty 60, 30d supply, fill #0
  Filled 2023-07-12: qty 60, 30d supply, fill #1

## 2023-06-08 MED ORDER — LURASIDONE HCL 80 MG PO TABS
80.0000 mg | ORAL_TABLET | Freq: Every day | ORAL | 3 refills | Status: DC
  Filled 2023-07-23: qty 30, 30d supply, fill #0
  Filled 2023-08-17: qty 30, 30d supply, fill #1
  Filled 2023-09-17: qty 30, 30d supply, fill #2
  Filled 2023-10-22: qty 30, 30d supply, fill #3

## 2023-06-08 MED ORDER — L-METHYLFOLATE 7.5 MG PO TABS
7.5000 mg | ORAL_TABLET | Freq: Every day | ORAL | 1 refills | Status: DC
Start: 2023-06-08 — End: 2023-06-29
  Filled 2023-06-08 – 2023-06-11 (×2): qty 30, 30d supply, fill #0

## 2023-06-08 MED ORDER — LAMOTRIGINE 100 MG PO TABS
100.0000 mg | ORAL_TABLET | Freq: Every day | ORAL | 1 refills | Status: DC
  Filled 2023-07-09: qty 30, 30d supply, fill #0
  Filled 2023-08-06: qty 30, 30d supply, fill #1

## 2023-06-11 ENCOUNTER — Other Ambulatory Visit (HOSPITAL_BASED_OUTPATIENT_CLINIC_OR_DEPARTMENT_OTHER): Payer: Self-pay

## 2023-06-12 ENCOUNTER — Other Ambulatory Visit (HOSPITAL_BASED_OUTPATIENT_CLINIC_OR_DEPARTMENT_OTHER): Payer: Self-pay

## 2023-06-21 ENCOUNTER — Other Ambulatory Visit (HOSPITAL_BASED_OUTPATIENT_CLINIC_OR_DEPARTMENT_OTHER): Payer: Self-pay

## 2023-06-21 ENCOUNTER — Telehealth: Payer: Self-pay | Admitting: Psychiatry

## 2023-06-21 ENCOUNTER — Encounter: Payer: Self-pay | Admitting: Psychiatry

## 2023-06-21 ENCOUNTER — Telehealth: Payer: Self-pay

## 2023-06-21 ENCOUNTER — Telehealth: Payer: Medicaid Other | Admitting: Psychiatry

## 2023-06-21 DIAGNOSIS — F411 Generalized anxiety disorder: Secondary | ICD-10-CM | POA: Diagnosis not present

## 2023-06-21 DIAGNOSIS — F3132 Bipolar disorder, current episode depressed, moderate: Secondary | ICD-10-CM

## 2023-06-21 MED ORDER — LAMOTRIGINE 25 MG PO TABS
25.0000 mg | ORAL_TABLET | Freq: Every day | ORAL | 0 refills | Status: DC
  Filled 2023-06-21: qty 30, 30d supply, fill #0

## 2023-06-21 NOTE — Patient Instructions (Signed)
 Increase lamotrigine  200 mg daily, 125 mg at night  Continue Latuda  80 mg daily Continue fluoxetine  40 mg daily  Start L-methylfolate 7.5 mg daily  Continue clonazepam  0.5 mg twice as needed for anxiety Next appointment: 2/14 at 9 30
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
## 2023-06-21 NOTE — Telephone Encounter (Signed)
 She was able to have a visit today. This was addressed at her visit.

## 2023-06-21 NOTE — Telephone Encounter (Signed)
 ROI obtained for patient work, Copywriter, advertising, [employer] in order to fax letter of excuse. ROI signed, scanned, and letter faxed to her work.

## 2023-06-24 NOTE — Progress Notes (Signed)
Virtual Visit via Video Note  I connected with Ashley Franklin on 06/29/23 at  9:30 AM EST by a video enabled telemedicine application and verified that I am speaking with the correct person using two identifiers.  Location: Patient: home Provider: office Persons participated in the visit- patient, provider    I discussed the limitations of evaluation and management by telemedicine and the availability of in person appointments. The patient expressed understanding and agreed to proceed.    I discussed the assessment and treatment plan with the patient. The patient was provided an opportunity to ask questions and all were answered. The patient agreed with the plan and demonstrated an understanding of the instructions.   The patient was advised to call back or seek an in-person evaluation if the symptoms worsen or if the condition fails to improve as anticipated.    [name], MD    [facility] MD/PA/NP OP Progress Note  06/29/2023 10:22 AM Ashley Franklin  MRN:  [id number]  Chief Complaint:  Chief Complaint  Patient presents with   Follow-up   HPI:  This is a follow-up appointment for bipolar disorder, anxiety.  She states that she has been feeling scary to go back to work.  She has been discretionary trying to find a new work, although she has not had any luck yet.  She has reached out to the attorney as she believes he will be going to cut her pay.  She has been focusing on what she needs to do prior to going back on Monday.  She feels that she is more in control of things as an illness she stays out from work.  She feels that she has pending executions day.  She states that it is to her the realization that she is not she was better. She talks about her siblings, who have their own careers, and believes she is the fifth of six siblings. She has a brother, who convicted felony.  She cannot find a positive aspect of herself.  She feels like she is a beaten dead dog. She does not feel she  is mentally ready to be back to work. After having discussed about possible options in related to return to work, she would like to extend for another week. She sleeps fair. She has fair appetite. She denies SI. Of note, she states that she feels stupid and asks if medication could be the cause. While providing psychoeducation on the possibility that medication can sometimes make people feel dull, I highlighted her ability to reach out to an attorney and manage other tasks despite her ongoing stress. She is now willing to try therapy, and has agreed with the plan as outlined below.    Visit Diagnosis:    ICD-10-CM   1. Bipolar affective disorder, currently depressed, moderate (HCC)  F31.32     2. Anxiety state  F41.1       Past Psychiatric History: Please see initial evaluation for full details. I have reviewed the history. No updates at this time.     Past Medical History:  Past Medical History:  Diagnosis Date   Anemia    Anxiety    Bipolar affective disorder (HCC)    Blood transfusion without reported diagnosis    Bronchospasm with bronchitis, acute    Depression    Diabetes (HCC)    diet controlled   Kidney stone    Obesity    Right carpal tunnel syndrome 12/09/2018   SBO (small bowel obstruction) (HCC) 01/2014  Sleep apnea    uses CPAP    Past Surgical History:  Procedure Laterality Date   CARPAL TUNNEL RELEASE Right 08/05/2019   Procedure: RIGHT CARPAL TUNNEL RELEASE;  Surgeon: Cindee Salt, MD;  Location: [facility];  Service: Orthopedics;  Laterality: Right;  IV REGIONAL FOREARM BLOCK   CESAREAN SECTION  1994   ENDOMETRIAL ABLATION     GASTRIC BYPASS  2001   KIDNEY STONE SURGERY  2010   SBO  2015    Family Psychiatric History: Please see initial evaluation for full details. I have reviewed the history. No updates at this time.     Family History:  Family History  Problem Relation Age of Onset   Diabetes Mother    Cancer Mother 61       Throat     Bipolar disorder Mother    Kidney disease Mother    Heart disease Father    Bipolar disorder Sister    Bipolar disorder Sister    Bipolar disorder Brother    Drug abuse Brother    Colon cancer Maternal Aunt    Kidney disease Maternal Grandmother    Heart disease Paternal Grandfather    Cancer - Prostate Neg Hx    Breast cancer Neg Hx    Esophageal cancer Neg Hx    Rectal cancer Neg Hx    Stomach cancer Neg Hx     Social History:  Social History   Socioeconomic History   Marital status: Married    Spouse name: Not on file   Number of children: 2   Years of education: Not on file   Highest education level: Not on file  Occupational History   Occupation: office administor  Tobacco Use   Smoking status: Former    Current packs/day: 0.25    Average packs/day: 0.3 packs/day for 6.0 years (1.5 ttl pk-yrs)    Types: Cigarettes   Smokeless tobacco: Never   Tobacco comments:    1/2 ppd   Vaping Use   Vaping status: Never Used  Substance and Sexual Activity   Alcohol use: Yes    Alcohol/week: 0.0 standard drinks of alcohol    Comment: Two times a week.    Drug use: No   Sexual activity: Yes    Partners: Male    Birth control/protection: Surgical  Other Topics Concern   Not on file  Social History Narrative      1 son born 1988-08-13 daughter born in 40 she is married and lives with her husband   Prior smoker not current 3 caffeinated beverages a day no alcohol tobacco or drug use      2 independent Acupuncturist schools, music teacher   Right handed    Caffeine 2 cups daily    Social Drivers of Corporate investment banker Strain: Not on file  Food Insecurity: Low Risk  (11/06/2022)   [facility]   Hunger Vital Sign    Worried About Running Out of Food in the Last Year: Never true    Ran Out of Food in the Last Year: Never true  Transportation Needs: Not on file (11/06/2022)  Physical Activity: Not on file  Stress: Not  on file  Social Connections: Not on file    Allergies:  Allergies  Allergen Reactions   Nsaids Other (See Comments)    Contraindicated with gastric bypass    Metabolic Disorder Labs: Lab Results  Component Value Date  HGBA1C 4.9 02/06/2017   MPG 105 02/26/2015   No results found for: "PROLACTIN" Lab Results  Component Value Date   CHOL 165 02/06/2017   TRIG 87 02/06/2017   HDL 54 02/06/2017   LDLCALC 94 02/06/2017   Lab Results  Component Value Date   TSH 1.12 07/10/2017   TSH 1.22 06/08/2016    Therapeutic Level Labs: No results found for: "LITHIUM" Lab Results  Component Value Date   VALPROATE 67 03/27/2022   VALPROATE 62 11/29/2020   No results found for: "CBMZ"  Current Medications: Current Outpatient Medications  Medication Sig Dispense Refill   L-Methylfolate 15 MG TABS Take 1 tablet (15 mg total) by mouth daily. 30 tablet 2   acetaminophen (TYLENOL) 500 MG tablet Take 2 tablets (1,000 mg total) by mouth every 6 (six) hours as needed for moderate pain. 30 tablet 0   amphetamine-dextroamphetamine (ADDERALL XR) 25 MG 24 hr capsule Take 1 capsule (25 mg total) by mouth every morning. 30 capsule 0   amphetamine-dextroamphetamine (ADDERALL XR) 25 MG 24 hr capsule Take 1 capsule (25 mg total) by mouth every morning. 30 capsule 0   amphetamine-dextroamphetamine (ADDERALL XR) 25 MG 24 hr capsule Take 1 capsule (25 mg total) by mouth every morning. 30 capsule 0   amphetamine-dextroamphetamine (ADDERALL XR) 25 MG 24 hr capsule Take 1 capsule by mouth every morning. 30 capsule 0   Armodafinil 250 MG tablet Take 1 tablet (250 mg total) by mouth daily 30 tablet 5   Armodafinil 250 MG tablet Take 1 tablet (250 mg total) by mouth daily. 30 tablet 5   Armodafinil 250 MG tablet Take 1 tablet (250 mg total) by mouth daily. 30 tablet 5   atorvastatin (LIPITOR) 20 MG tablet Take 1 tablet (20 mg total) by mouth at bedtime. 90 tablet 0   clonazePAM (KLONOPIN) 0.5 MG tablet Take  1 tablet (0.5 mg total) by mouth 2 (two) times daily as needed for anxiety. 60 tablet 1   cyclobenzaprine (FLEXERIL) 10 MG tablet Take 1 tablet (10 mg total) by mouth 2 (two) times daily as needed for muscle spasms. 20 tablet 0   FLUoxetine (PROZAC) 40 MG capsule Take 1 capsule (40 mg total) by mouth daily. 30 capsule 4   [START ON 07/07/2023] lamoTRIgine (LAMICTAL) 100 MG tablet Take 1 tablet (100 mg total) by mouth at bedtime. 30 tablet 1   lamoTRIgine (LAMICTAL) 200 MG tablet Take 1 tablet (200 mg total) by mouth daily. 30 tablet 4   lamoTRIgine (LAMICTAL) 25 MG tablet Take 1 tablet (25 mg total) by mouth at bedtime. Total of 125 mg at night. Take along with 100 mg tab 30 tablet 0   lubiprostone (AMITIZA) 24 MCG capsule Take 1 capsule (24 mcg total) by mouth 2 (two) times daily. 120 capsule 3   [START ON 07/22/2023] lurasidone (LATUDA) 80 MG TABS tablet Take 1 tablet (80 mg total) by mouth daily with breakfast. 30 tablet 3   omeprazole (PRILOSEC) 20 MG capsule Take 1 capsule (20 mg total) by mouth 2 (two) times daily before a meal. Best to take on an empty stomach. 90 capsule 1   Semaglutide-Weight Management (WEGOVY) 0.5 MG/0.5ML SOAJ Inject 0.5 mg into the skin once a week. 2 mL 0   valACYclovir (VALTREX) 500 MG tablet Take 1 tablet (500 mg total) by mouth 2 (two) times daily for 3 days as needed for vulvar lesions. 60 tablet 5   No current facility-administered medications for this visit.  Musculoskeletal: Strength & Muscle Tone:  N/A Gait & Station:  N/A Patient leans: N/A  Psychiatric Specialty Exam: Review of Systems  Psychiatric/Behavioral:  Positive for dysphoric mood and sleep disturbance. Negative for agitation, behavioral problems, confusion, decreased concentration, hallucinations, self-injury and suicidal ideas. The patient is nervous/anxious. The patient is not hyperactive.   All other systems reviewed and are negative.   There were no vitals taken for this visit.There is  no height or weight on file to calculate BMI.  General Appearance: Well Groomed  Eye Contact:  Good  Speech:  Clear and Coherent  Volume:  Normal  Mood:  Anxious  Affect:  Appropriate, Congruent, and Tearful  Thought Process:  Coherent  Orientation:  Full (Time, Place, and Person)  Thought Content: Logical   Suicidal Thoughts:  No  Homicidal Thoughts:  No  Memory:  Immediate;   Good  Judgement:  Good  Insight:  Good  Psychomotor Activity:  Normal  Concentration:  Concentration: Good and Attention Span: Good  Recall:  Good  Fund of Knowledge: Good  Language: Good  Akathisia:  No  Handed:  Right  AIMS (if indicated): not done  Assets:  Communication Skills Desire for Improvement  ADL's:  Intact  Cognition: WNL  Sleep:  Fair   Screenings: GAD-7    Flowsheet Row Office Visit from 01/01/2023 in [facility]  Total GAD-7 Score 19      PHQ2-9    Flowsheet Row Office Visit from 01/01/2023 in [facility] Office Visit from 11/08/2021 in [facility] Video Visit from 06/03/2021 in [facility] Video Visit from 02/25/2021 in [facility] Office Visit from 11/29/2020 in [facility]  PHQ-2 Total Score 6 4 0 0 1  PHQ-9 Total Score 23 15 -- -- --      Flowsheet Row ED from 01/15/2023 in [facility] Office Visit from 01/01/2023 in [facility] Office Visit from 11/08/2021 in [facility]  C-SSRS RISK CATEGORY No Risk Error: Q3, 4, or 5 should not be populated when Q2 is No No Risk        Assessment and Plan:  Ashley Franklin is a 54 y.o. year old female with a history of bipolar disorder, anxiety, anemia, obesity s/p gastric  bypass surgery, who presents for follow up appointment for below.   1. Bipolar affective disorder, currently depressed, moderate (HCC) 2. Anxiety state Acute stressors include: work related stress, loneliness, brother with substance use, who recently had MVA, her son, who had alcohol withdrawal/seizure Other stressors include: separation,  her mother with cancer in spine    History:Tx from Dr. Lolly Mustache. History of  impulsive sexually inappropriate behavior, irritability, anger, grandiosity when her husband left in 2010 Admitted in 2015 for depression. Originally on stelazine, trileptal 300 mg TID, fluoxetine 60 mg, amitriptyline 25 mg TID, valium 5 mg TID, Ambien 10 mg qhs     Exam is notable for heightened anxiety with fear in the context of returning to work next Monday. ,  She continues to experience depressive symptoms and anxiety, though she has gained more control over her actions since being out of work.  Will continue current dose of lamotrigine for now given it was recently titrated for bipolar depression.  Will continue current dose of Latuda for bipolar depression, along with  fluoxetine for depression and anxiety.  Will uptitrate methyl folate to optimize her treatment given her history of gastric bypass surgery, being on wegovy.  Will continue clonazepam as needed for anxiety.  She will greatly benefit from CBT; we make a referral.   # tremor R/o postural termor She has postural hand tremors.  Although she reports some shakiness in her leg, it was not noticeable during the previous exam.  Will continue to assess.     Last checked  EKG HR 85, QTc454 msec 11/2022  Lipid panels LDL 86 04/2023  HbA1c 5.2 05/6107       Plan:  Continue lamotrigine 200 mg daily, 125 mg at night  Continue Latuda 80 mg daily - akathisia from 100 mg  Continue fluoxetine 40 mg daily  Increase L-methyl folate 15 mg daily  Continue clonazepam 0.5 mg twice as needed for anxiety Next appointment: 2/28 at 9 30,  video and 3/25 at 8 am, video Letter is written to support that she is out from work from today through Feb 21th, and return on 24th   - on Adderall XR 25 mg daily, armodafinil for narcolepsy   Past trials of medication: Lexapro, Paxil, amitriptyline, Abilify (irritable), oxcarbazepine, perphenazine, Trifluoperazine, Klonopin, Valium. Vistaril, Ambien    The patient demonstrates the following  risk factors for suicide: Chronic risk factors for suicide include psychiatric disorder /bipolar disorder, previous self-harm of scratching herself. Acute risk factors for suicide include none. Protective factors for this patient include positive social support, positive therapeutic relationship, hope for the future. Considering these factors, the overall suicide risk at this point appears to be moderate, but not imminent. Patient denies gun access. Discussed in detail safety plan that anytime having active suicidal thoughts or homicidal thoughts and she need to call 911 or go to local emergency room.    A total of 40 minutes was spent on the following activities during the encounter date, which includes but is not limited to: preparing to see the patient (e.g., reviewing tests and records), obtaining and/or reviewing separately obtained history, performing a medically necessary examination or evaluation, counseling and educating the patient, family, or caregiver, ordering medications, tests, or procedures, referring and communicating with other healthcare professionals (when not reported separately), documenting clinical information in the electronic or paper health record, independently interpreting test or lab results and communicating these results to the family or caregiver, and coordinating care (when not reported separately).   Collaboration of Care: Collaboration of Care: Other reviewed notes in Epic  Patient/Guardian was advised Release of Information must be obtained prior to any record release in order  to collaborate their care with an outside provider. Patient/Guardian was advised if they have not already done so to contact the registration department to sign all necessary forms in order for [place] to release information regarding their care.   Consent: Patient/Guardian gives verbal consent for treatment and assignment of benefits for services provided during this visit. Patient/Guardian expressed understanding and agreed to proceed.    [name], MD 06/29/2023, 10:22 AM

## 2023-06-29 ENCOUNTER — Encounter: Payer: Self-pay | Admitting: Psychiatry

## 2023-06-29 ENCOUNTER — Telehealth: Payer: Medicaid Other | Admitting: Psychiatry

## 2023-06-29 ENCOUNTER — Other Ambulatory Visit (HOSPITAL_BASED_OUTPATIENT_CLINIC_OR_DEPARTMENT_OTHER): Payer: Self-pay

## 2023-06-29 DIAGNOSIS — F411 Generalized anxiety disorder: Secondary | ICD-10-CM

## 2023-06-29 DIAGNOSIS — F3132 Bipolar disorder, current episode depressed, moderate: Secondary | ICD-10-CM | POA: Diagnosis not present

## 2023-06-29 MED ORDER — L-METHYLFOLATE 15 MG PO TABS
15.0000 mg | ORAL_TABLET | Freq: Every day | ORAL | 2 refills | Status: AC
  Filled 2023-06-29: qty 30, 30d supply, fill #0
  Filled 2023-07-02: qty 90, 90d supply, fill #0
  Filled 2023-07-12 (×2): qty 30, 30d supply, fill #0

## 2023-07-02 ENCOUNTER — Other Ambulatory Visit (HOSPITAL_BASED_OUTPATIENT_CLINIC_OR_DEPARTMENT_OTHER): Payer: Self-pay

## 2023-07-02 MED ORDER — WEGOVY 1 MG/0.5ML ~~LOC~~ SOAJ
1.0000 mg | SUBCUTANEOUS | 0 refills | Status: DC
Start: 2023-07-02 — End: 2023-07-30
  Filled 2023-07-02: qty 2, 28d supply, fill #0

## 2023-07-05 ENCOUNTER — Telehealth: Payer: Self-pay

## 2023-07-05 ENCOUNTER — Encounter: Payer: Self-pay | Admitting: Psychiatry

## 2023-07-05 NOTE — Telephone Encounter (Signed)
The letter has been created. Could you print it out and fax it to the company? I believe we have the ROI for this.

## 2023-07-05 NOTE — Telephone Encounter (Signed)
Please contact the patient, and let me know if any additional letter is needed.

## 2023-07-05 NOTE — Telephone Encounter (Signed)
Medication management - Message left for patient that this nurse had tried multiple times today to send teh letter from Dr. Vanetta Shawl to [place], [employer], Fax #[phone] but that this was not going through and saying no answer. Requested patient call this nurse back if there was another fax number for [employer] or if patient would like to come by the office to pick up a copy of Dr. Bing Matter second letter she prepared today.

## 2023-07-05 NOTE — Telephone Encounter (Signed)
Medication management - Telephone call with patient to follow up on a MyChart message patient left that she now needed a 2nd letter for work about her time to be out. Patient stated she understood Dr. Vanetta Shawl had already written one for her to be out until 07/09/23, through 07/05/22, but now she wanted her to write another letter stating she had received the notice from patient's [employer] company requesting she return on 07/03/23 but that patient by her recommendation was still written out through until 07/09/23.  She wants a letter so they know this recommendation did not change with her original letter and after receiving their letter that was written 07/02/23.  Agreed top send request to Dr. Vanetta Shawl for the needed second letter.

## 2023-07-06 NOTE — Telephone Encounter (Signed)
i tried to send fax again, i did email her the letter. she was called and told that she was emailed letter and that she should also be able to see in the mychart also. i let her know that the fax is not going through.

## 2023-07-08 NOTE — Progress Notes (Unsigned)
 Virtual Visit via Video Note  I connected with Ashley Franklin on 07/13/23 at  9:30 AM EST by a video enabled telemedicine application and verified that I am speaking with the correct person using two identifiers.  Location: Patient: home Provider: office Persons participated in the visit- patient, provider    I discussed the limitations of evaluation and management by telemedicine and the availability of in person appointments. The patient expressed understanding and agreed to proceed.    I discussed the assessment and treatment plan with the patient. The patient was provided an opportunity to ask questions and all were answered. The patient agreed with the plan and demonstrated an understanding of the instructions.   The patient was advised to call back or seek an in-person evaluation if the symptoms worsen or if the condition fails to improve as anticipated.    [name], MD    [facility] MD/PA/NP OP Progress Note  07/13/2023 12:28 PM Ashley Franklin  MRN:  [id number]  Chief Complaint:  Chief Complaint  Patient presents with   Follow-up   HPI:  This is a follow-up appointment for bipolar disorder, anxiety, fatigue.  She states that they had cut her pay, and removed her from job duties.  She returned to work on Monday, and she was told by her boss that she is despicable person.  He told her to leave.  However, she has been doing okay.  He hurt her feeling, made her feel insignificant.  However, after speaking with her parents, she now think there is a regiment reason to be mad at him.  He did so much wrong to so many people.  Although she has never done this, she is thinking of suing.  She has been able to sleep better most of the time.  Although she has moments of feeling down, it has been more manageable.  She has been trying to be positive.  She interviewed, and is hoping to start a new job.  She has decrease in appetite and weight since being on Wegovy.  She denies SI/HI.  She  denies decreased need for sleep or euphoria.  She takes clonazepam every day for anxiety.  She denies alcohol use or drug use.  She agrees with the plan as outlined below.    112/64 07/02/2023 2:44 PM EST    96.6 kg (213 lb) 07/02/2023 2:44 PM EST    Wt Readings from Last 3 Encounters:  01/15/23 240 lb (108.9 kg)  01/01/23 250 lb 12.8 oz (113.8 kg)  11/08/21 205 lb 9.6 oz (93.3 kg)     Visit Diagnosis:    ICD-10-CM   1. Bipolar affective disorder, currently depressed, moderate (HCC)  F31.32     2. Anxiety state  F41.1     3. Iron deficiency anemia, unspecified iron deficiency anemia type  D50.9     4. High risk medication use  Z79.899 Urine Drug Panel 7    5. Fatigue, unspecified type  R53.83       Past Psychiatric History: Please see initial evaluation for full details. I have reviewed the history. No updates at this time.     Past Medical History:  Past Medical History:  Diagnosis Date   Anemia    Anxiety    Bipolar affective disorder (HCC)    Blood transfusion without reported diagnosis    Bronchospasm with bronchitis, acute    Depression    Diabetes (HCC)    diet controlled   Kidney stone  Obesity    Right carpal tunnel syndrome 12/09/2018   SBO (small bowel obstruction) (HCC) 01/2014   Sleep apnea    uses CPAP    Past Surgical History:  Procedure Laterality Date   CARPAL TUNNEL RELEASE Right 08/05/2019   Procedure: RIGHT CARPAL TUNNEL RELEASE;  Surgeon: Cindee Salt, MD;  Location: [facility];  Service: Orthopedics;  Laterality: Right;  IV REGIONAL FOREARM BLOCK   CESAREAN SECTION  1994   ENDOMETRIAL ABLATION     GASTRIC BYPASS  2001   KIDNEY STONE SURGERY  2010   SBO  2015    Family Psychiatric History: Please see initial evaluation for full details. I have reviewed the history. No updates at this time.     Family History:  Family History  Problem Relation Age of Onset   Diabetes Mother    Cancer Mother 103       Throat     Bipolar disorder Mother    Kidney disease Mother    Heart disease Father    Bipolar disorder Sister    Bipolar disorder Sister    Bipolar disorder Brother    Drug abuse Brother    Colon cancer Maternal Aunt    Kidney disease Maternal Grandmother    Heart disease Paternal Grandfather    Cancer - Prostate Neg Hx    Breast cancer Neg Hx    Esophageal cancer Neg Hx    Rectal cancer Neg Hx    Stomach cancer Neg Hx     Social History:  Social History   Socioeconomic History   Marital status: Married    Spouse name: Not on file   Number of children: 2   Years of education: Not on file   Highest education level: Not on file  Occupational History   Occupation: office administor  Tobacco Use   Smoking status: Former    Current packs/day: 0.25    Average packs/day: 0.3 packs/day for 6.0 years (1.5 ttl pk-yrs)    Types: Cigarettes   Smokeless tobacco: Never   Tobacco comments:    1/2 ppd   Vaping Use   Vaping status: Never Used  Substance and Sexual Activity   Alcohol use: Yes    Alcohol/week: 0.0 standard drinks of alcohol    Comment: Two times a week.    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, music teacher   Right handed    Caffeine 2 cups daily    Social Drivers of Corporate investment banker Strain: Not on file  Food Insecurity: Low Risk  (11/06/2022)   [facility]   Hunger Vital Sign    Worried About Running Out of Food in the Last Year: Never true    Ran Out of Food in the Last Year: Never true  Transportation Needs: Not on file (11/06/2022)  Physical Activity: Not on file  Stress: Not on  file  Social Connections: Not on file    Allergies:  Allergies  Allergen Reactions   Nsaids Other (See Comments)  Contraindicated with gastric bypass    Metabolic Disorder Labs: Lab Results  Component Value Date   HGBA1C 4.9 02/06/2017   MPG 105 02/26/2015   No results found for: "PROLACTIN" Lab Results  Component Value Date   CHOL 165 02/06/2017   TRIG 87 02/06/2017   HDL 54 02/06/2017   LDLCALC 94 02/06/2017   Lab Results  Component Value Date   TSH 1.12 07/10/2017   TSH 1.22 06/08/2016    Therapeutic Level Labs: No results found for: "LITHIUM" Lab Results  Component Value Date   VALPROATE 67 03/27/2022   VALPROATE 62 11/29/2020   No results found for: "CBMZ"  Current Medications: Current Outpatient Medications  Medication Sig Dispense Refill   Ferrous Fumarate (HEMOCYTE - 106 MG FE) 324 (106 Fe) MG TABS tablet Take 1 tablet by mouth.     lamoTRIgine (LAMICTAL) 150 MG tablet Take 1 tablet (150 mg total) by mouth every evening. 30 tablet 0   acetaminophen (TYLENOL) 500 MG tablet Take 2 tablets (1,000 mg total) by mouth every 6 (six) hours as needed for moderate pain. 30 tablet 0   amphetamine-dextroamphetamine (ADDERALL XR) 25 MG 24 hr capsule Take 1 capsule (25 mg total) by mouth every morning. 30 capsule 0   amphetamine-dextroamphetamine (ADDERALL XR) 25 MG 24 hr capsule Take 1 capsule (25 mg total) by mouth every morning. 30 capsule 0   amphetamine-dextroamphetamine (ADDERALL XR) 25 MG 24 hr capsule Take 1 capsule (25 mg total) by mouth every morning. 30 capsule 0   amphetamine-dextroamphetamine (ADDERALL XR) 25 MG 24 hr capsule Take 1 capsule by mouth every morning. 30 capsule 0   Armodafinil 250 MG tablet Take 1 tablet (250 mg total) by mouth daily 30 tablet 5   Armodafinil 250 MG tablet Take 1 tablet (250 mg total) by mouth daily. 30 tablet 5   Armodafinil 250 MG tablet Take 1 tablet (250 mg total) by mouth daily. 30 tablet 5   atorvastatin (LIPITOR) 20  MG tablet Take 1 tablet (20 mg total) by mouth at bedtime. 90 tablet 0   clonazePAM (KLONOPIN) 0.5 MG tablet Take 1 tablet (0.5 mg total) by mouth 2 (two) times daily as needed for anxiety. 60 tablet 1   cyclobenzaprine (FLEXERIL) 10 MG tablet Take 1 tablet (10 mg total) by mouth 2 (two) times daily as needed for muscle spasms. 20 tablet 0   FLUoxetine (PROZAC) 40 MG capsule Take 1 capsule (40 mg total) by mouth daily. 30 capsule 4   L-Methylfolate 15 MG TABS Take 1 tablet (15 mg total) by mouth daily. 30 tablet 2   lamoTRIgine (LAMICTAL) 100 MG tablet Take 1 tablet (100 mg total) by mouth at bedtime. 30 tablet 1   lamoTRIgine (LAMICTAL) 200 MG tablet Take 1 tablet (200 mg total) by mouth daily. 30 tablet 4   lamoTRIgine (LAMICTAL) 25 MG tablet Take 1 tablet (25 mg total) by mouth at bedtime. Total of 125 mg at night. Take along with 100 mg tab 30 tablet 0   lubiprostone (AMITIZA) 24 MCG capsule Take 1 capsule (24 mcg total) by mouth 2 (two) times daily. 120 capsule 3   [START ON 07/22/2023] lurasidone (LATUDA) 80 MG TABS tablet Take 1 tablet (80 mg total) by mouth daily with breakfast. 30 tablet 3   omeprazole (PRILOSEC) 20 MG capsule Take 1 capsule (20 mg total) by mouth 2 (two) times daily before a meal. Best to take on an empty stomach. 90 capsule 1   Semaglutide-Weight Management (WEGOVY)  1 MG/0.5ML SOAJ Inject 1 mg into the skin once a week. 2 mL 0   valACYclovir (VALTREX) 500 MG tablet Take 1 tablet (500 mg total) by mouth 2 (two) times daily for 3 days as needed for vulvar lesions. 60 tablet 5   No current facility-administered medications for this visit.     Musculoskeletal: Strength & Muscle Tone:  N.A Gait & Station:  N/A Patient leans: N/A  Psychiatric Specialty Exam: Review of Systems  Psychiatric/Behavioral:  Positive for dysphoric mood. Negative for agitation, behavioral problems, confusion, decreased concentration, hallucinations, self-injury, sleep disturbance and suicidal  ideas. The patient is nervous/anxious. The patient is not hyperactive.   All other systems reviewed and are negative.   There were no vitals taken for this visit.There is no height or weight on file to calculate BMI.  General Appearance: Well Groomed  Eye Contact:  Good  Speech:  Clear and Coherent  Volume:  Normal  Mood:   fine  Affect:  Appropriate, Congruent, and Tearful  Thought Process:  Coherent  Orientation:  Full (Time, Place, and Person)  Thought Content: Logical   Suicidal Thoughts:  No  Homicidal Thoughts:  No  Memory:  Immediate;   Good  Judgement:  Good  Insight:  Good  Psychomotor Activity:  Normal  Concentration:  Concentration: Good and Attention Span: Good  Recall:  Good  Fund of Knowledge: Good  Language: Good  Akathisia:  No  Handed:  Right  AIMS (if indicated): not done  Assets:  Communication Skills Desire for Improvement  ADL's:  Intact  Cognition: WNL  Sleep:  Poor   Screenings: GAD-7    Flowsheet Row Office Visit from [facility]/01/2023 in [facility]  Total GAD-7 Score 19      PHQ2-9    Flowsheet Row Office Visit from 01/01/2023 in [facility] Office Visit from 11/08/2021 in [facility] Video Visit from 06/03/2021 in [facility] Video Visit from 02/25/2021 in [facility] Office Visit from 11/29/2020 in [facility]  PHQ-2 Total Score 6 4 0 0 1  PHQ-9 Total Score 23 15 -- -- --      Flowsheet Row ED from 01/15/2023 in [facility] Office Visit from 01/01/2023 in [facility] Office Visit from 11/08/2021 in [facility]  C-SSRS RISK CATEGORY No Risk Error: Q3, 4, or 5 should not be populated when Q2  is No No Risk        Assessment and Plan:  Ashley Franklin is a 53 y.o. year old female with a history of bipolar disorder, anxiety, anemia, obesity s/p gastric bypass surgery, who presents for follow up appointment for below.   1. Bipolar affective disorder, currently depressed, moderate (HCC) 2. Anxiety state Acute stressors include: work related stress, loneliness, brother with substance use, who recently had MVA, her son, who had alcohol withdrawal/seizure Other stressors include: separation,  her mother with cancer in spine    History:Tx from Dr. Lolly Mustache. History of  impulsive sexually inappropriate behavior, irritability, anger, grandiosity when her husband left in 2010 Admitted in 2015 for depression. Originally on stelazine, trileptal 300 mg TID, fluoxetine 60 mg, amitriptyline 25 mg TID, valium 5 mg TID, Ambien 10 mg qhs     Exam is notable for brighter affect, and she reports overall improvement in  her depressive symptoms despite the current situation at work.  This coincided with starting L-methylfolate, and uptitration of lamotrigine.  Will uptitrate lamotrigine for prevention of bipolar depression.  Discussed potential risk of Stevens-Johnson syndrome.  Will continue Latuda for bipolar depression.  Will continue fluoxetine for bipolar depression and anxiety.  Will continue clonazepam as needed for anxiety.  She has an upcoming appointment with therapist.   3. Iron deficiency anemia, unspecified iron deficiency anemia type # fatigue  - ferritin 6 04/2023 She continues to experience fatigue.  She is advised to take iron tablets along with vitamin C, and iron rich food.  She previously had limited benefit from oral iron, and had a history of infusion.  She agrees to discuss this with her primary care.   4. High risk medication use Will obtain UDS for screening.        Last checked  EKG HR 85, QTc454 msec 11/2022  Lipid panels LDL 86 04/2023  HbA1c 5.2 05/6107       Plan:   Increase lamotrigine 200 mg daily, 150 mg at night - uptitrated 07/13/2023 Continue Latuda 80 mg daily - akathisia from 100 mg  Continue fluoxetine 40 mg daily  Continue  L-methyl folate 15 mg daily  Continue clonazepam 0.5 mg twice as needed for anxiety Obtain UDS Next appointment: 3/25 at 8 am, video Letter is written to support that she is out from work from today through March 8th, and return on 10 th - upcoming appointment with Ms. Pricilla Loveless for therapy  Although there has been overall improvement in her mood symptoms, she previously experienced a relapse upon returning to work. I recommend that she remain out of work until March 8th and return on 10th to help prevent a relapse and allow the medication to take full effect.  - on Adderall XR 25 mg daily, armodafinil for narcolepsy - on wegovy   Past trials of medication: Lexapro, Paxil, amitriptyline, Abilify (irritable), oxcarbazepine, perphenazine, Trifluoperazine, Klonopin, Valium. Vistaril, Ambien    The patient demonstrates the following  risk factors for suicide: Chronic risk factors for suicide include psychiatric disorder /bipolar disorder, previous self-harm of scratching herself. Acute risk factors for suicide include none. Protective factors for this patient include positive social support, positive therapeutic relationship, hope for the future. Considering these factors, the overall suicide risk at this point appears to be moderate, but not imminent. Patient denies gun access. Discussed in detail safety plan that anytime having active suicidal thoughts or homicidal thoughts and she need to call 911 or go to local emergency room  A total of 42 minutes was spent on the following activities during the encounter date, which includes but is not limited to: preparing to see the patient (e.g., reviewing tests and records), obtaining and/or reviewing separately obtained history, performing a medically necessary examination or evaluation,  counseling and educating the patient, family, or caregiver, ordering medications, tests, or procedures, referring and communicating with other healthcare professionals (when not reported separately), documenting clinical information in the electronic or paper health record, independently interpreting test or lab results and communicating these results to the family or caregiver, and coordinating care (when not reported separately).   Collaboration of Care: Collaboration of Care: Other reviewed notes in Epic  Patient/Guardian was advised Release of Information must be obtained prior to any record release in order to collaborate their care with an outside provider. Patient/Guardian was advised if they have not already done so to contact the registration department to sign all necessary  forms in order for [place] to release information regarding their care.   Consent: Patient/Guardian gives verbal consent for treatment and assignment of benefits for services provided during this visit. Patient/Guardian expressed understanding and agreed to proceed.    [name], MD 07/13/2023, 12:28 PM

## 2023-07-09 ENCOUNTER — Other Ambulatory Visit (HOSPITAL_BASED_OUTPATIENT_CLINIC_OR_DEPARTMENT_OTHER): Payer: Self-pay

## 2023-07-12 ENCOUNTER — Other Ambulatory Visit (HOSPITAL_BASED_OUTPATIENT_CLINIC_OR_DEPARTMENT_OTHER): Payer: Self-pay

## 2023-07-13 ENCOUNTER — Encounter: Payer: Self-pay | Admitting: Psychiatry

## 2023-07-13 ENCOUNTER — Other Ambulatory Visit (HOSPITAL_BASED_OUTPATIENT_CLINIC_OR_DEPARTMENT_OTHER): Payer: Self-pay

## 2023-07-13 ENCOUNTER — Telehealth: Payer: Medicaid Other | Admitting: Psychiatry

## 2023-07-13 DIAGNOSIS — F411 Generalized anxiety disorder: Secondary | ICD-10-CM

## 2023-07-13 DIAGNOSIS — D509 Iron deficiency anemia, unspecified: Secondary | ICD-10-CM | POA: Diagnosis not present

## 2023-07-13 DIAGNOSIS — Z79899 Other long term (current) drug therapy: Secondary | ICD-10-CM

## 2023-07-13 DIAGNOSIS — R5383 Other fatigue: Secondary | ICD-10-CM | POA: Diagnosis not present

## 2023-07-13 DIAGNOSIS — F3132 Bipolar disorder, current episode depressed, moderate: Secondary | ICD-10-CM

## 2023-07-13 MED ORDER — LAMOTRIGINE 150 MG PO TABS
150.0000 mg | ORAL_TABLET | Freq: Every evening | ORAL | 0 refills | Status: DC
  Filled 2023-07-13: qty 30, 30d supply, fill #0

## 2023-07-23 ENCOUNTER — Other Ambulatory Visit: Payer: Self-pay

## 2023-07-23 ENCOUNTER — Other Ambulatory Visit (HOSPITAL_BASED_OUTPATIENT_CLINIC_OR_DEPARTMENT_OTHER): Payer: Self-pay

## 2023-07-23 ENCOUNTER — Other Ambulatory Visit: Payer: Self-pay | Admitting: Psychiatry

## 2023-07-24 ENCOUNTER — Other Ambulatory Visit (HOSPITAL_BASED_OUTPATIENT_CLINIC_OR_DEPARTMENT_OTHER): Payer: Self-pay

## 2023-07-25 ENCOUNTER — Other Ambulatory Visit (HOSPITAL_BASED_OUTPATIENT_CLINIC_OR_DEPARTMENT_OTHER): Payer: Self-pay

## 2023-07-25 ENCOUNTER — Other Ambulatory Visit: Payer: Self-pay | Admitting: Psychiatry

## 2023-07-26 ENCOUNTER — Other Ambulatory Visit (HOSPITAL_BASED_OUTPATIENT_CLINIC_OR_DEPARTMENT_OTHER): Payer: Self-pay

## 2023-07-30 ENCOUNTER — Other Ambulatory Visit (HOSPITAL_BASED_OUTPATIENT_CLINIC_OR_DEPARTMENT_OTHER): Payer: Self-pay

## 2023-07-30 MED ORDER — WEGOVY 1.7 MG/0.75ML ~~LOC~~ SOAJ
1.7000 mg | SUBCUTANEOUS | 0 refills | Status: DC
  Filled 2023-07-30: qty 3, 28d supply, fill #0

## 2023-07-30 NOTE — Telephone Encounter (Signed)
 Duplicate-opened in error.

## 2023-08-04 NOTE — Progress Notes (Unsigned)
 Virtual Visit via Video Note  I connected with Ashley Franklin on 08/07/23 at  8:00 AM EDT by a video enabled telemedicine application and verified that I am speaking with the correct person using two identifiers.  Location: Patient: outside Provider: office Persons participated in the visit- patient, provider    I discussed the limitations of evaluation and management by telemedicine and the availability of in person appointments. The patient expressed understanding and agreed to proceed.   I discussed the assessment and treatment plan with the patient. The patient was provided an opportunity to ask questions and all were answered. The patient agreed with the plan and demonstrated an understanding of the instructions.   The patient was advised to call back or seek an in-person evaluation if the symptoms worsen or if the condition fails to improve as anticipated.   [name], MD    [facility] MD/PA/NP OP Progress Note  08/07/2023 8:34 AM Ashley Franklin  MRN:  [id number]  Chief Complaint:  Chief Complaint  Patient presents with   Follow-up   HPI:  This is a follow-up appointment for bipolar disorder, anxiety.  She states that she has left the job.  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  [employer] filed the charge, and they are waiting to see if he agrees for mediation.  She tends to feel scared when she is at home.  He has a history of throwing out to another employee house.  She makes sure the door is locked, and she has middle insomnia, feeling anxious.  She was scared when she heard knocking on the window, she is oversensitive.  She feels more comfortable when she is outside of the house.  She feels so relieved that she was able to leave the job.  Although there is a pay cut, her father offered to support her.  She feels less depressed.  She denies SI.  She denies decreased need for sleep or euphoria.  She denies hallucinations.  She denies alcohol  use or drug use.   BP 120/72 (BP Location: Left arm)  Pulse 78  Ht 1.638 m (5' 4.5")  Wt 94.5 kg (208 lb 6.4 oz)  SpO2 98%  BMI 35.22 kg/m  07/2023  Wt Readings from Last 3 Encounters:  01/15/23 240 lb (108.9 kg)  01/01/23 250 lb 12.8 oz (113.8 kg)  11/08/21 205 lb 9.6 oz (93.3 kg)     Visit Diagnosis:    ICD-10-CM   1. Bipolar affective disorder, currently depressed, moderate (HCC)  F31.32     2. Anxiety state  F41.1     3. Iron deficiency anemia, unspecified iron deficiency anemia type  D50.9     4. High risk medication use  Z79.899       Past Psychiatric History: Please see initial evaluation for full details. I have reviewed the history. No updates at this time.    Past Medical History:  Past Medical History:  Diagnosis Date   Anemia    Anxiety    Bipolar affective disorder (HCC)    Blood transfusion without reported diagnosis    Bronchospasm with bronchitis, acute    Depression    Diabetes (HCC)    diet controlled   Kidney stone    Obesity    Right carpal tunnel syndrome 12/09/2018   SBO (small bowel obstruction) (HCC) 01/2014   Sleep apnea    uses CPAP    Past Surgical History:  Procedure Laterality Date   CARPAL TUNNEL RELEASE Right 08/05/2019   Procedure: RIGHT CARPAL TUNNEL RELEASE;  Surgeon: Cindee Salt, MD;  Location: [facility];  Service: Orthopedics;  Laterality: Right;  IV REGIONAL FOREARM BLOCK   CESAREAN SECTION  1994   ENDOMETRIAL ABLATION     GASTRIC BYPASS  2001   KIDNEY STONE SURGERY  2010   SBO  2015    Family Psychiatric History: Please see initial evaluation for full details. I have reviewed the history. No updates at this time.     Family History:  Family History  Problem Relation Age of Onset   Diabetes Mother    Cancer Mother 10       Throat    Bipolar disorder Mother    Kidney disease Mother    Heart disease Father    Bipolar disorder Sister    Bipolar disorder Sister    Bipolar disorder Brother     Drug abuse Brother    Colon cancer Maternal Aunt    Kidney disease Maternal Grandmother    Heart disease Paternal Grandfather    Cancer - Prostate Neg Hx    Breast cancer Neg Hx    Esophageal cancer Neg Hx    Rectal cancer Neg Hx    Stomach cancer Neg Hx     Social History:  Social History   Socioeconomic History   Marital status: Married    Spouse name: Not on file   Number of children: 2   Years of education: Not on file   Highest education level: Not on file  Occupational History   Occupation: office administor  Tobacco Use   Smoking status: Former    Current packs/day: 0.25    Average packs/day: 0.3 packs/day for 6.0 years (1.5 ttl pk-yrs)    Types: Cigarettes   Smokeless tobacco: Never   Tobacco comments:    1/2 ppd   Vaping Use   Vaping status: Never Used  Substance and Sexual Activity   Alcohol use: Yes    Alcohol/week: 0.0 standard drinks of alcohol    Comment: Two times a week.    Drug use: No   Sexual activity: Yes    Partners: Male    Birth control/protection: Surgical  Other Topics Concern   Not on file  Social History Narrative      1 son born 1988-08-13 daughter born in 74 she is married and lives with her husband   Prior smoker not current 3 caffeinated beverages a day no alcohol tobacco or drug use      2 independent Acupuncturist schools, music teacher   Right handed    Caffeine 2 cups daily    Social Drivers of Corporate investment banker Strain: Not on file  Food Insecurity: Low Risk  (11/06/2022)   [facility]   H[facility] Vital Sign    Worried About Running Out of Food in the Last Year: Never true    Ran Out of Food in the Last Year: Never true  Transportation Needs: Not on file (11/06/2022)  Physical Activity: Not on file  Stress: Not on file  Social Connections: Not on file    Allergies:  Allergies  Allergen Reactions   Nsaids Other (See Comments)    Contraindicated with gastric bypass     Metabolic Disorder Labs: Lab Results  Component Value Date   HGBA1C 4.9 02/06/2017   MPG 105 02/26/2015   No results found for: "  PROLACTIN" Lab Results  Component Value Date   CHOL 165 02/06/2017   TRIG 87 02/06/2017   HDL 54 02/06/2017   LDLCALC 94 02/06/2017   Lab Results  Component Value Date   TSH 1.12 07/10/2017   TSH 1.22 06/08/2016    Therapeutic Level Labs: No results found for: "LITHIUM" Lab Results  Component Value Date   VALPROATE 67 03/27/2022   VALPROATE 62 11/29/2020   No results found for: "CBMZ"  Current Medications: Current Outpatient Medications  Medication Sig Dispense Refill   acetaminophen (TYLENOL) 500 MG tablet Take 2 tablets (1,000 mg total) by mouth every 6 (six) hours as needed for moderate pain. 30 tablet 0   amphetamine-dextroamphetamine (ADDERALL XR) 25 MG 24 hr capsule Take 1 capsule (25 mg total) by mouth every morning. 30 capsule 0   amphetamine-dextroamphetamine (ADDERALL XR) 25 MG 24 hr capsule Take 1 capsule (25 mg total) by mouth every morning. 30 capsule 0   amphetamine-dextroamphetamine (ADDERALL XR) 25 MG 24 hr capsule Take 1 capsule (25 mg total) by mouth every morning. 30 capsule 0   amphetamine-dextroamphetamine (ADDERALL XR) 25 MG 24 hr capsule Take 1 capsule by mouth every morning. 30 capsule 0   Armodafinil 250 MG tablet Take 1 tablet (250 mg total) by mouth daily 30 tablet 5   Armodafinil 250 MG tablet Take 1 tablet (250 mg total) by mouth daily. 30 tablet 5   Armodafinil 250 MG tablet Take 1 tablet (250 mg total) by mouth daily. 30 tablet 5   atorvastatin (LIPITOR) 20 MG tablet Take 1 tablet (20 mg total) by mouth at bedtime. 90 tablet 0   [START ON 08/11/2023] clonazePAM (KLONOPIN) 0.5 MG tablet Take 1 tablet (0.5 mg total) by mouth 2 (two) times daily as needed for anxiety. 60 tablet 1   cyclobenzaprine (FLEXERIL) 10 MG tablet Take 1 tablet (10 mg total) by mouth 2 (two) times daily as needed for muscle spasms. 20 tablet  0   Ferrous Fumarate (HEMOCYTE - 106 MG FE) 324 (106 Fe) MG TABS tablet Take 1 tablet by mouth.     FLUoxetine (PROZAC) 40 MG capsule Take 1 capsule (40 mg total) by mouth daily. 30 capsule 4   L-Methylfolate 15 MG TABS Take 1 tablet (15 mg total) by mouth daily. 30 tablet 2   [START ON 08/24/2023] lamoTRIgine (LAMICTAL) 150 MG tablet Take 1 tablet (150 mg total) by mouth every evening. 30 tablet 0   lamoTRIgine (LAMICTAL) 200 MG tablet Take 1 tablet (200 mg total) by mouth daily. 30 tablet 4   lamoTRIgine (LAMICTAL) 25 MG tablet Take 1 tablet (25 mg total) by mouth at bedtime. Total of 125 mg at night. Take along with 100 mg tab 30 tablet 0   lubiprostone (AMITIZA) 24 MCG capsule Take 1 capsule (24 mcg total) by mouth 2 (two) times daily. 120 capsule 3   lurasidone (LATUDA) 80 MG TABS tablet Take 1 tablet (80 mg total) by mouth daily with breakfast. 30 tablet 3   omeprazole (PRILOSEC) 20 MG capsule Take 1 capsule (20 mg total) by mouth 2 (two) times daily before a meal. Best to take on an empty stomach. 90 capsule 1   Semaglutide-Weight Management (WEGOVY) 1.7 MG/0.75ML SOAJ Inject 1.7 mg into the skin once a week. 3 mL 0   valACYclovir (VALTREX) 500 MG tablet Take 1 tablet (500 mg total) by mouth 2 (two) times daily for 3 days as needed for vulvar lesions. 60 tablet 5   No current  facility-administered medications for this visit.     Musculoskeletal: Strength & Muscle Tone:  N/A Gait & Station:  N/A Patient leans: N/A  Psychiatric Specialty Exam: Review of Systems  Psychiatric/Behavioral:  Positive for sleep disturbance. Negative for agitation, behavioral problems, confusion, decreased concentration, dysphoric mood, hallucinations, self-injury and suicidal ideas. The patient is nervous/anxious. The patient is not hyperactive.   All other systems reviewed and are negative.   There were no vitals taken for this visit.There is no height or weight on file to calculate BMI.  General  Appearance: Well Groomed  Eye Contact:  Good  Speech:  Clear and Coherent  Volume:  Normal  Mood:   much better  Affect:  Appropriate, Congruent, and Full Range  Thought Process:  Coherent  Orientation:  Full (Time, Place, and Person)  Thought Content: Logical   Suicidal Thoughts:  No  Homicidal Thoughts:  No  Memory:  Immediate;   Good  Judgement:  Good  Insight:  Good  Psychomotor Activity:  Normal  Concentration:  Concentration: Good and Attention Span: Good  Recall:  Good  Fund of Knowledge: Good  Language: Good  Akathisia:  No  Handed:  Right  AIMS (if indicated): not done  Assets:  Communication Skills Desire for Improvement  ADL's:  Intact  Cognition: WNL  Sleep:  Poor   Screenings: GAD-7    Flowsheet Row Office Visit from 01/01/2023 in [facility]  Total GAD-7 Score 19      PHQ2-9    Flowsheet Row Office Visit from 01/01/2023 in [facility] Office Visit from 11/08/2021 in [facility] Video Visit from 06/03/2021 in [facility] Video Visit from 02/25/2021 in [facility] Office Visit from 11/29/2020 in [facility]  PHQ-2 Total Score 6 4 0 0 1  PHQ-9 Total Score 23 15 -- -- --      Flowsheet Row ED from 01/15/2023 in [facility] Office Visit from 01/01/2023 in [facility] Office Visit from 11/08/2021 in [facility]  C-SSRS RISK CATEGORY No Risk Error: Q3, 4, or 5 should not be populated when Q2 is No No Risk        Assessment and Plan:  Ashley Franklin is a 53 y.o. year old female with a history of bipolar disorder, anxiety, anemia, obesity s/p gastric bypass surgery, who presents for follow up  appointment for below.   1. Bipolar affective disorder, currently depressed, moderate (HCC) 2. Anxiety state Acute stressors include: work related stress, loneliness, brother with substance use, who recently had MVA, her son, who had alcohol withdrawal/seizure Other stressors include: separation,  her mother with cancer in spine    History:Tx from Dr. Lolly Mustache. History of  impulsive sexually inappropriate behavior, irritability, anger, grandiosity when her husband left in 2010 Admitted in 2015 for depression. Originally on stelazine, trileptal 300 mg TID, fluoxetine 60 mg, amitriptyline 25 mg TID, valium 5 mg TID, Ambien 10 mg qhs     Exam is notable for brighter affect, and there has been some improvement in depressive symptoms in the context of leaving the work.  This also coincided with starting L-methylfolate, and uptitration of lamotrigine.  While she started to experience nightmares, hypervigilance, related to the situation at work, she feels comfortable to maintain on the current medication regimen given that  it could be situational.  Will continue Latuda, lamotrigine for bipolar depression.  Will continue fluoxetine for bipolar depression and anxiety.  Will continue clonazepam as needed for anxiety.  She is willing, and will greatly benefit from CBT; will contact the front again for this referral.   3. Iron deficiency anemia, unspecified iron deficiency anemia type # fatigue  - ferritin 6 04/2023 Unchanged.  She is advised to take iron tablets along with vitamin C, and iron rich food.  She previously had limited benefit from oral iron, and had a history of infusion.  She agreed to discuss this with her primary care.   4. High risk medication use She was advised again to obtain UDS given she is on clonazepam.  She expressed understanding that the medication will not be continued if she is unable to have this done.       Last checked  EKG HR 85, QTc454 msec 11/2022  Lipid panels LDL 86 04/2023   HbA1c 5.2 10/2022       Plan:  Continue lamotrigine 200 mg daily, 150 mg at night - uptitrated 07/13/2023 Continue Latuda 80 mg daily - akathisia from 100 mg  Continue fluoxetine 40 mg daily  Continue  L-methyl folate 15 mg daily  Continue clonazepam 0.5 mg twice as needed for anxiety Obtain UDS Next appointment: 5/6 at 8 am, video Front desk is notified again about therapy referral    Although there has been overall improvement in her mood symptoms, she previously experienced a relapse upon returning to work. I recommend that she remain out of work until March 8th and return on 10th to help prevent a relapse and allow the medication to take full effect.   - on Adderall XR 25 mg daily, armodafinil for narcolepsy - on wegovy    Past trials of medication: Lexapro, Paxil, amitriptyline, Abilify (irritable), oxcarbazepine, perphenazine, Trifluoperazine, Klonopin, Valium. Vistaril, Ambien    The patient demonstrates the following  risk factors for suicide: Chronic risk factors for suicide include psychiatric disorder /bipolar disorder, previous self-harm of scratching herself. Acute risk factors for suicide include none. Protective factors for this patient include positive social support, positive therapeutic relationship, hope for the future. Considering these factors, the overall suicide risk at this point appears to be moderate, but not imminent. Patient denies gun access. Discussed in detail safety plan that anytime having active suicidal thoughts or homicidal thoughts and she need to call 911 or go to local emergency room  Collaboration of Care: Collaboration of Care: Other reviewed notes in Epic  Patient/Guardian was advised Release of Information must be obtained prior to any record release in order to collaborate their care with an outside provider. Patient/Guardian was advised if they have not already done so to contact the registration department to sign all necessary forms in order for  [place] to release information regarding their care.   Consent: Patient/Guardian gives verbal consent for treatment and assignment of benefits for services provided during this visit. Patient/Guardian expressed understanding and agreed to proceed.    [name], MD 08/07/2023, 8:34 AM

## 2023-08-07 ENCOUNTER — Other Ambulatory Visit (HOSPITAL_BASED_OUTPATIENT_CLINIC_OR_DEPARTMENT_OTHER): Payer: Self-pay

## 2023-08-07 ENCOUNTER — Telehealth (INDEPENDENT_AMBULATORY_CARE_PROVIDER_SITE_OTHER): Payer: Self-pay | Admitting: Psychiatry

## 2023-08-07 ENCOUNTER — Encounter: Payer: Self-pay | Admitting: Psychiatry

## 2023-08-07 ENCOUNTER — Other Ambulatory Visit: Payer: Self-pay

## 2023-08-07 DIAGNOSIS — D509 Iron deficiency anemia, unspecified: Secondary | ICD-10-CM | POA: Diagnosis not present

## 2023-08-07 DIAGNOSIS — F411 Generalized anxiety disorder: Secondary | ICD-10-CM | POA: Diagnosis not present

## 2023-08-07 DIAGNOSIS — Z79899 Other long term (current) drug therapy: Secondary | ICD-10-CM

## 2023-08-07 DIAGNOSIS — F3132 Bipolar disorder, current episode depressed, moderate: Secondary | ICD-10-CM

## 2023-08-07 MED ORDER — LAMOTRIGINE 150 MG PO TABS
150.0000 mg | ORAL_TABLET | Freq: Every evening | ORAL | 0 refills | Status: DC
  Filled 2023-08-24: qty 30, 30d supply, fill #0

## 2023-08-07 MED ORDER — CLONAZEPAM 0.5 MG PO TABS
0.5000 mg | ORAL_TABLET | Freq: Two times a day (BID) | ORAL | 1 refills | Status: DC | PRN
  Filled 2023-08-07 – 2023-08-09 (×2): qty 60, 30d supply, fill #0
  Filled 2023-09-28: qty 60, 30d supply, fill #1

## 2023-08-07 NOTE — Patient Instructions (Addendum)
 Continue lamotrigine 200 mg daily, 150 mg at night Continue Latuda 80 mg daily  Continue fluoxetine 40 mg daily  Continue  L-methyl folate 15 mg daily  Continue clonazepam 0.5 mg twice as needed for anxiety Obtain UDS Next appointment: 5/6 at 8 am

## 2023-08-08 ENCOUNTER — Other Ambulatory Visit (HOSPITAL_BASED_OUTPATIENT_CLINIC_OR_DEPARTMENT_OTHER): Payer: Self-pay

## 2023-08-08 MED ORDER — ATORVASTATIN CALCIUM 20 MG PO TABS
20.0000 mg | ORAL_TABLET | Freq: Every day | ORAL | 0 refills | Status: DC
Start: 2023-08-08 — End: 2023-11-14
  Filled 2023-08-08: qty 90, 90d supply, fill #0

## 2023-08-09 ENCOUNTER — Other Ambulatory Visit: Payer: Self-pay

## 2023-08-09 ENCOUNTER — Other Ambulatory Visit (HOSPITAL_BASED_OUTPATIENT_CLINIC_OR_DEPARTMENT_OTHER): Payer: Self-pay

## 2023-08-22 ENCOUNTER — Other Ambulatory Visit (HOSPITAL_BASED_OUTPATIENT_CLINIC_OR_DEPARTMENT_OTHER): Payer: Self-pay

## 2023-08-24 ENCOUNTER — Other Ambulatory Visit (HOSPITAL_BASED_OUTPATIENT_CLINIC_OR_DEPARTMENT_OTHER): Payer: Self-pay

## 2023-08-28 ENCOUNTER — Other Ambulatory Visit (HOSPITAL_BASED_OUTPATIENT_CLINIC_OR_DEPARTMENT_OTHER): Payer: Self-pay

## 2023-08-28 ENCOUNTER — Other Ambulatory Visit: Payer: Self-pay

## 2023-08-28 MED ORDER — WEGOVY 2.4 MG/0.75ML ~~LOC~~ SOAJ
2.4000 mg | SUBCUTANEOUS | 0 refills | Status: DC
Start: 2023-08-28 — End: 2023-12-03
  Filled 2023-08-28 (×2): qty 9, 84d supply, fill #0

## 2023-08-28 MED ORDER — OMEPRAZOLE 20 MG PO CPDR
20.0000 mg | DELAYED_RELEASE_CAPSULE | Freq: Two times a day (BID) | ORAL | 1 refills | Status: AC | PRN
  Filled 2023-08-28 (×2): qty 180, 90d supply, fill #0
  Filled 2024-02-05: qty 180, 90d supply, fill #1

## 2023-08-31 ENCOUNTER — Other Ambulatory Visit (HOSPITAL_BASED_OUTPATIENT_CLINIC_OR_DEPARTMENT_OTHER): Payer: Self-pay

## 2023-09-10 ENCOUNTER — Ambulatory Visit (INDEPENDENT_AMBULATORY_CARE_PROVIDER_SITE_OTHER): Payer: Medicaid Other | Admitting: Professional Counselor

## 2023-09-10 DIAGNOSIS — F3132 Bipolar disorder, current episode depressed, moderate: Secondary | ICD-10-CM
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
## 2023-09-14 ENCOUNTER — Other Ambulatory Visit (HOSPITAL_BASED_OUTPATIENT_CLINIC_OR_DEPARTMENT_OTHER): Payer: Self-pay

## 2023-09-14 MED ORDER — AMPHETAMINE-DEXTROAMPHET ER 25 MG PO CP24
25.0000 mg | ORAL_CAPSULE | Freq: Every morning | ORAL | 0 refills | Status: DC
  Filled 2023-09-28: qty 30, 30d supply, fill #0

## 2023-09-14 NOTE — Progress Notes (Unsigned)
 No show

## 2023-09-17 ENCOUNTER — Other Ambulatory Visit (HOSPITAL_BASED_OUTPATIENT_CLINIC_OR_DEPARTMENT_OTHER): Payer: Self-pay

## 2023-09-17 ENCOUNTER — Other Ambulatory Visit: Payer: Self-pay

## 2023-09-18 ENCOUNTER — Telehealth (INDEPENDENT_AMBULATORY_CARE_PROVIDER_SITE_OTHER): Payer: Self-pay | Admitting: Psychiatry

## 2023-09-18 DIAGNOSIS — Z91199 Patient's noncompliance with other medical treatment and regimen due to unspecified reason: Secondary | ICD-10-CM

## 2023-09-24 ENCOUNTER — Other Ambulatory Visit: Payer: Self-pay

## 2023-09-24 ENCOUNTER — Other Ambulatory Visit: Payer: Self-pay | Admitting: Psychiatry

## 2023-09-24 ENCOUNTER — Ambulatory Visit (INDEPENDENT_AMBULATORY_CARE_PROVIDER_SITE_OTHER): Admitting: Professional Counselor

## 2023-09-24 DIAGNOSIS — F3132 Bipolar disorder, current episode depressed, moderate: Secondary | ICD-10-CM

## 2023-09-24 NOTE — Telephone Encounter (Signed)
 Scheduled virtual for 09-26-23

## 2023-09-24 NOTE — Telephone Encounter (Signed)
 Please contact and schedule 30 mins follow up. Virtual visit is fine. Will plan to fill till her next visit.

## 2023-09-24 NOTE — Progress Notes (Unsigned)
  THERAPIST PROGRESS NOTE  Session Time: 11:05 AM - 11:59 AM   Participation Level: {BHH PARTICIPATION LEVEL:22264}  Behavioral Response: {Appearance:22683}{BHH LEVEL OF CONSCIOUSNESS:22305}{BHH MOOD:22306}  Type of Therapy: {CHL AMB BH Type of Therapy:21022741}  Treatment Goals addressed: ***  ProgressTowards Goals: {Progress Towards Goals:21014066}  Interventions: {CHL AMB BH Type of Intervention:21022753}  Summary: Ashley Franklin is a 53 y.o. female who presents with ***. Tx plan, diagnosis?   Suicidal/Homicidal: {BHH YES OR NO:22294}{yes/no/with/without intent/plan:22693}  Therapist Response: Conducted session with . Began session with check-in/update since previous session. Utilized empathetic and reflective listening. Scheduled additional appointment and concluded session.   Plan: Return again in *** weeks.  Diagnosis: No diagnosis found.  Collaboration of Care: {BH OP Collaboration of Care:21014065}  Patient/Guardian was advised Release of Information must be obtained prior to any record release in order to collaborate their care with an outside provider. Patient/Guardian was advised if they have not already done so to contact the registration department to sign all necessary forms in order for us  to release information regarding their care.   Consent: Patient/Guardian gives verbal consent for treatment and assignment of benefits for services provided during this visit. Patient/Guardian expressed understanding and agreed to proceed.   Len Quale, [facility] 09/24/2023

## 2023-09-26 ENCOUNTER — Encounter: Payer: Self-pay | Admitting: Psychiatry

## 2023-09-26 ENCOUNTER — Other Ambulatory Visit (HOSPITAL_BASED_OUTPATIENT_CLINIC_OR_DEPARTMENT_OTHER): Payer: Self-pay

## 2023-09-26 ENCOUNTER — Telehealth (INDEPENDENT_AMBULATORY_CARE_PROVIDER_SITE_OTHER): Admitting: Psychiatry

## 2023-09-26 DIAGNOSIS — F419 Anxiety disorder, unspecified: Secondary | ICD-10-CM | POA: Diagnosis not present

## 2023-09-26 DIAGNOSIS — F3132 Bipolar disorder, current episode depressed, moderate: Secondary | ICD-10-CM

## 2023-09-26 DIAGNOSIS — Z79899 Other long term (current) drug therapy: Secondary | ICD-10-CM | POA: Diagnosis not present

## 2023-09-26 MED ORDER — LAMOTRIGINE 150 MG PO TABS
150.0000 mg | ORAL_TABLET | Freq: Every evening | ORAL | 3 refills | Status: DC
  Filled 2023-10-01 – 2023-10-15 (×2): qty 30, 30d supply, fill #0
  Filled 2023-11-15: qty 30, 30d supply, fill #1
  Filled 2023-12-17: qty 30, 30d supply, fill #2
  Filled 2024-01-21 – 2024-01-29 (×2): qty 30, 30d supply, fill #3

## 2023-09-26 NOTE — Progress Notes (Signed)
 Virtual Visit via Video Note  I connected with Ashley Franklin on 09/26/23 at  9:00 AM EDT by a video enabled telemedicine application and verified that I am speaking with the correct person using two identifiers.  Location: Patient: home Provider: home office Persons participated in the visit- patient, provider    I discussed the limitations of evaluation and management by telemedicine and the availability of in person appointments. The patient expressed understanding and agreed to proceed.     I discussed the assessment and treatment plan with the patient. The patient was provided an opportunity to ask questions and all were answered. The patient agreed with the plan and demonstrated an understanding of the instructions.   The patient was advised to call back or seek an in-person evaluation if the symptoms worsen or if the condition fails to improve as anticipated.    Todd Fossa, MD    [facility] MD/PA/NP OP Progress Note  09/26/2023 9:36 AM Ashley Franklin  MRN:  [id number]  Chief Complaint:  Chief Complaint  Patient presents with   Follow-up   HPI:  This is a follow-up appointment for bipolar disorder, anxiety.  She states that she has been very busy.  She works at [employer], and there are hundreds of Financial trader.  Although she feels anxious at work, she has been receiving good feedback from management.  This means more than anything, and she feels comfortable at the current work.  She also finds it helpful to keep herself busy.  She is struggling with not knowing her schedule until Thursday.  She occasionally does shift in the morning, and late in the evening.  However, she feels exhausted and she is able to sleep through the night.  She has been communicating with her family, including her father.  He has been very supportive.  She also states the call from her son on Mother's Day.  They communicate every month.  He continues to have issues with alcohol use, and she has been tried  approaching a different way.  Her ex-husband has been extremely helpful.  He is like a good friend to her.  She sees change, and denies any concern about mistreatment.  She denies unprovoked intense anxiety.  She has occasional thoughts about her boss, or has anxiety when she sees some vehicle, wondering that if that is her previous boss. EEOC is doing their own investigation, and she has no contact with the previous work, which helps her to release the tension.  She denies nightmares.  She has been able to feel relaxed when she takes a shower.  She set a stool in a bathtub, and enjoys this time.  She denies change in appetite.  She denies SI.  She denies decreased need for sleep or euphoria.  She feels comfortable to stay on the current medication regimen.   BP 120/72 (BP Location: Left arm)  Pulse 78  Ht 1.638 m (5' 4.5")  Wt 94.5 kg (208 lb 6.4 oz)  SpO2 98%  BMI 35.22 kg/m  07/2023      Wt Readings from Last 3 Encounters:  01/15/23 240 lb (108.9 kg)  01/01/23 250 lb 12.8 oz (113.8 kg)  11/08/21 205 lb 9.6 oz (93.3 kg)    Visit Diagnosis:    ICD-10-CM   1. Bipolar affective disorder, currently depressed, moderate (HCC)  F31.32     2. Anxiety disorder, unspecified type  F41.9     3. High risk medication use  Z79.899 Drugs of abuse scrn  w alc, routine urine      Past Psychiatric History: Please see initial evaluation for full details. I have reviewed the history. No updates at this time.     Past Medical History:  Past Medical History:  Diagnosis Date   Anemia    Anxiety    Bipolar affective disorder (HCC)    Blood transfusion without reported diagnosis    Bronchospasm with bronchitis, acute    Depression    Diabetes (HCC)    diet controlled   Kidney stone    Obesity    Right carpal tunnel syndrome 12/09/2018   SBO (small bowel obstruction) (HCC) 01/2014   Sleep apnea    uses CPAP    Past Surgical History:  Procedure Laterality Date   CARPAL TUNNEL RELEASE Right  08/05/2019   Procedure: RIGHT CARPAL TUNNEL RELEASE;  Surgeon: Lyanne Sample, MD;  Location: [facility];  Service: Orthopedics;  Laterality: Right;  IV REGIONAL FOREARM BLOCK   CESAREAN SECTION  1994   ENDOMETRIAL ABLATION     GASTRIC BYPASS  2001   KIDNEY STONE SURGERY  2010   SBO  2015    Family Psychiatric History: Please see initial evaluation for full details. I have reviewed the history. No updates at this time.     Family History:  Family History  Problem Relation Age of Onset   Diabetes Mother    Cancer Mother 39       Throat    Bipolar disorder Mother    Kidney disease Mother    Heart disease Father    Bipolar disorder Sister    Bipolar disorder Sister    Bipolar disorder Brother    Drug abuse Brother    Colon cancer Maternal Aunt    Kidney disease Maternal Grandmother    Heart disease Paternal Grandfather    Cancer - Prostate Neg Hx    Breast cancer Neg Hx    Esophageal cancer Neg Hx    Rectal cancer Neg Hx    Stomach cancer Neg Hx     Social History:  Social History   Socioeconomic History   Marital status: Married    Spouse name: Not on file   Number of children: 2   Years of education: Not on file   Highest education level: Not on file  Occupational History   Occupation: office administor  Tobacco Use   Smoking status: Former    Current packs/day: 0.25    Average packs/day: 0.3 packs/day for 6.0 years (1.5 ttl pk-yrs)    Types: Cigarettes   Smokeless tobacco: Never   Tobacco comments:    1/2 ppd   Vaping Use   Vaping status: Never Used  Substance and Sexual Activity   Alcohol use: Yes    Alcohol/week: 0.0 standard drinks of alcohol    Comment: Two times a week.    Drug use: No   Sexual activity: Yes    Partners: Male    Birth control/protection: Surgical  Other Topics Concern   Not on file  Social History Narrative      1 son born 1988-08-13 daughter born in 62 she is married and lives with her husband   Prior smoker not  current 3 caffeinated beverages a day no alcohol tobacco or drug use      2 independent teachers    Pharmacy schools, music teacher   Right handed    Caffeine 2 cups daily    Social Drivers of Corporate investment banker  Strain: High Risk (09/10/2023)   Overall Financial Resource Strain (CARDIA)    Difficulty of Paying Living Expenses: Hard  Food Insecurity: No Food Insecurity (09/10/2023)   Hunger Vital Sign    Worried About Running Out of Food in the Last Year: Never true    Ran Out of Food in the Last Year: Never true  Transportation Needs: No Transportation Needs (09/10/2023)   PRAPARE - Administrator, Civil Service (Medical): No    Lack of Transportation (Non-Medical): No  Physical Activity: Sufficiently Active (09/10/2023)   Exercise Vital Sign    Days of Exercise per Week: 5 days    Minutes of Exercise per Session: 150+ min  Stress: Stress Concern Present (09/10/2023)   [employer] of Occupational Health - Occupational Stress Questionnaire    Feeling of Stress : Very much  Social Connections: Socially Isolated (09/10/2023)   Social Connection and Isolation Panel [NHANES]    Frequency of Communication with Friends and Family: Twice a week    Frequency of Social Gatherings with Friends and Family: Once a week    Attends Religious Services: Never    Database administrator or Organizations: No    Attends Banker Meetings: Never    Marital Status: Separated    Allergies:  Allergies  Allergen Reactions   Nsaids Other (See Comments)    Contraindicated with gastric bypass    Metabolic Disorder Labs: Lab Results  Component Value Date   HGBA1C 4.9 02/06/2017   MPG 105 02/26/2015   No results found for: "PROLACTIN" Lab Results  Component Value Date   CHOL 165 02/06/2017   TRIG 87 02/06/2017   HDL 54 02/06/2017   LDLCALC 94 02/06/2017   Lab Results  Component Value Date   TSH 1.12 07/10/2017   TSH 1.22 06/08/2016    Therapeutic Level  Labs: No results found for: "LITHIUM" Lab Results  Component Value Date   VALPROATE 67 03/27/2022   VALPROATE 62 11/29/2020   No results found for: "CBMZ"  Current Medications: Current Outpatient Medications  Medication Sig Dispense Refill   acetaminophen  (TYLENOL ) 500 MG tablet Take 2 tablets (1,000 mg total) by mouth every 6 (six) hours as needed for moderate pain. 30 tablet 0   amphetamine -dextroamphetamine  (ADDERALL  XR) 25 MG 24 hr capsule Take 1 capsule (25 mg total) by mouth every morning. 30 capsule 0   amphetamine -dextroamphetamine  (ADDERALL  XR) 25 MG 24 hr capsule Take 1 capsule (25 mg total) by mouth every morning. 30 capsule 0   amphetamine -dextroamphetamine  (ADDERALL  XR) 25 MG 24 hr capsule Take 1 capsule by mouth every morning. 30 capsule 0   amphetamine -dextroamphetamine  (ADDERALL  XR) 25 MG 24 hr capsule Take 1 capsule by mouth in the morning. 30 capsule 0   Armodafinil  250 MG tablet Take 1 tablet (250 mg total) by mouth daily 30 tablet 5   Armodafinil  250 MG tablet Take 1 tablet (250 mg total) by mouth daily. 30 tablet 5   Armodafinil  250 MG tablet Take 1 tablet (250 mg total) by mouth daily. 30 tablet 5   atorvastatin  (LIPITOR) 20 MG tablet Take 1 tablet (20 mg total) by mouth at bedtime. 90 tablet 0   clonazePAM  (KLONOPIN ) 0.5 MG tablet Take 1 tablet (0.5 mg total) by mouth 2 (two) times daily as needed for anxiety. 60 tablet 1   cyclobenzaprine  (FLEXERIL ) 10 MG tablet Take 1 tablet (10 mg total) by mouth 2 (two) times daily as needed for muscle spasms. 20 tablet 0  Ferrous Fumarate (HEMOCYTE - 106 MG FE) 324 (106 Fe) MG TABS tablet Take 1 tablet by mouth.     FLUoxetine  (PROZAC ) 40 MG capsule Take 1 capsule (40 mg total) by mouth daily. 30 capsule 4   L-Methylfolate 15 MG TABS Take 1 tablet (15 mg total) by mouth daily. 30 tablet 2   [START ON 09/30/2023] lamoTRIgine  (LAMICTAL ) 150 MG tablet Take 1 tablet (150 mg total) by mouth every evening. 30 tablet 3   lamoTRIgine   (LAMICTAL ) 200 MG tablet Take 1 tablet (200 mg total) by mouth daily. 30 tablet 4   lubiprostone  (AMITIZA ) 24 MCG capsule Take 1 capsule (24 mcg total) by mouth 2 (two) times daily. 120 capsule 3   lurasidone  (LATUDA ) 80 MG TABS tablet Take 1 tablet (80 mg total) by mouth daily with breakfast. 30 tablet 3   omeprazole  (PRILOSEC) 20 MG capsule Take 1 capsule (20 mg total) by mouth 2 (two) times daily before a meal. Best to take on an empty stomach. 90 capsule 1   omeprazole  (PRILOSEC) 20 MG capsule Take 1 capsule (20 mg total) by mouth 2 (two) times daily as needed. 180 capsule 1   Semaglutide -Weight Management (WEGOVY ) 2.4 MG/0.75ML SOAJ Inject 2.4 mg into the skin once a week. 9 mL 0   valACYclovir  (VALTREX ) 500 MG tablet Take 1 tablet (500 mg total) by mouth 2 (two) times daily for 3 days as needed for vulvar lesions. 60 tablet 5   No current facility-administered medications for this visit.     Musculoskeletal: Strength & Muscle Tone: N/A Gait & Station: N/A Patient leans: N/A  Psychiatric Specialty Exam: Review of Systems  Psychiatric/Behavioral:  Negative for agitation, behavioral problems, confusion, decreased concentration, dysphoric mood, hallucinations, self-injury, sleep disturbance and suicidal ideas. The patient is nervous/anxious. The patient is not hyperactive.   All other systems reviewed and are negative.   There were no vitals taken for this visit.There is no height or weight on file to calculate BMI.  General Appearance: Well Groomed  Eye Contact:  Good  Speech:  Clear and Coherent  Volume:  Normal  Mood:  good  Affect:  Appropriate, Congruent, and slightly down  Thought Process:  Coherent  Orientation:  Full (Time, Place, and Person)  Thought Content: Logical   Suicidal Thoughts:  No  Homicidal Thoughts:  No  Memory:  Immediate;   Fair  Judgement:  Good  Insight:  Good  Psychomotor Activity:  Normal  Concentration:  Concentration: Good and Attention Span: Good   Recall:  Good  Fund of Knowledge: Good  Language: Good  Akathisia:  No  Handed:  Right  AIMS (if indicated): not done  Assets:  Social Support  ADL's:  Intact  Cognition: WNL  Sleep:  Fair   Screenings: GAD-7    Advertising copywriter from 09/10/2023 in [facility] Office Visit from 01/01/2023 in [facility]  Total GAD-7 Score 18 19      PHQ2-9    Flowsheet Row Counselor from 09/10/2023 in [facility] Office Visit from 01/01/2023 in [facility] Office Visit from 11/08/2021 in [facility] Video Visit from 06/03/2021 in [facility] Video Visit from 02/25/2021 in [facility]  PHQ-2 Total Score 4 6 4  0 0  PHQ-9 Total Score 12 23 15  -- --      Advertising copywriter  from 09/10/2023 in [facility] ED from 01/15/2023 in [facility] Emergency Department at [facility] Office Visit from 01/01/2023 in [facility]  C-SSRS RISK CATEGORY Low Risk No Risk Error: Q3, 4, or 5 should not be populated when Q2 is No        Assessment and Plan:  Ashley Franklin is a 53 y.o. year old female with a history of bipolar disorder, anxiety, anemia, obesity s/p gastric bypass surgery, who presents for follow up appointment for below.   1. Bipolar affective disorder, currently depressed, mild (HCC) 2. Anxiety disorder, unspecified type # r/o PTSD Acute stressors include: work related stress, loneliness, brother with substance use, who recently had MVA, her son, who had alcohol withdrawal/seizure Other stressors include: separation,  her mother with cancer in spine    History:Tx from Dr. Arfeen. History of  impulsive sexually inappropriate  behavior, irritability, anger, grandiosity when her husband left in 2010 Admitted in 2015 for depression. Originally on stelazine , trileptal  300 mg TID, fluoxetine  60 mg, amitriptyline  25 mg TID, valium  5 mg TID, Ambien  10 mg qhs     Although she appears slightly fatigued, she attributes this to work related stress, and reports improvement in depressive symptoms and anxiety since the last visit.  Will continue current medication regimen.  Will continue lamotrigine  and Latuda  for bipolar depression.  Will continue fluoxetine  for bipolar depression and anxiety.  Noted that she has PTSD like symptoms including intrusive thoughts about her boss at previous work, and hypervigilance, although it has been improving since leaving that work.  Will continue to monitor this.  Will continue clonazepam  as needed for anxiety.  She will greatly benefit from CBT; she will continue to see Ms. Deetta Farrow for therapy.   3. High risk medication use She was unable to get testing at [facility] due to issues with payment. Will send lab to [place],   3. Iron deficiency anemia, unspecified iron deficiency anemia type # fatigue  - ferritin 6 04/2023 Unchanged.  She is advised to take iron tablets along with vitamin C, and iron rich food.  She previously had limited benefit from oral iron, and had a history of infusion.  She agreed to discuss this with her primary care.        Last checked  EKG HR 85, QTc454 msec 11/2022  Lipid panels LDL 86 04/2023  HbA1c 5.2 06/9526       Plan:  Continue lamotrigine  200 mg daily, 150 mg at night - uptitrated 07/13/2023 Continue Latuda  80 mg daily - akathisia from 100 mg  Continue fluoxetine  40 mg daily  Continue  L-methyl folate 15 mg daily  Continue clonazepam  0.5 mg twice as needed for anxiety - a refill left. She uses this a few times per week Obtain UDS Next appointment: 6/23 at 9 am, video - on Adderall  XR 25 mg daily, armodafinil  for narcolepsy - on wegovy     Past trials of  medication: Lexapro, Paxil, amitriptyline , Abilify  (irritable), oxcarbazepine , perphenazine, Trifluoperazine , Klonopin , Valium . Vistaril , Ambien     The patient demonstrates the following  risk factors for suicide: Chronic risk factors for suicide include psychiatric disorder /bipolar disorder, previous self-harm of scratching herself. Acute risk factors for suicide include none. Protective factors for this patient include positive social support, positive therapeutic relationship, hope for the future. Considering these factors, the overall suicide risk at this point appears to be moderate, but not imminent. Patient denies gun access. Discussed in detail  safety plan that anytime having active suicidal thoughts or homicidal thoughts and she need to call 911 or go to local emergency room  Collaboration of Care: Collaboration of Care: Other reviewed notes in Epic  Patient/Guardian was advised Release of Information must be obtained prior to any record release in order to collaborate their care with an outside provider. Patient/Guardian was advised if they have not already done so to contact the registration department to sign all necessary forms in order for us  to release information regarding their care.   Consent: Patient/Guardian gives verbal consent for treatment and assignment of benefits for services provided during this visit. Patient/Guardian expressed understanding and agreed to proceed.    Todd Fossa, MD 09/26/2023, 9:36 AM

## 2023-09-26 NOTE — Patient Instructions (Signed)
 Continue lamotrigine  200 mg daily, 150 mg at night  Continue Latuda  80 mg daily - akathisia from 100 mg  Continue fluoxetine  40 mg daily  Continue  L-methyl folate 15 mg daily  Continue clonazepam  0.5 mg twice as needed for anxiety Obtain UDS Next appointment: 6/23 at 9 am

## 2023-09-28 ENCOUNTER — Other Ambulatory Visit (HOSPITAL_BASED_OUTPATIENT_CLINIC_OR_DEPARTMENT_OTHER): Payer: Self-pay

## 2023-10-01 ENCOUNTER — Other Ambulatory Visit (HOSPITAL_BASED_OUTPATIENT_CLINIC_OR_DEPARTMENT_OTHER): Payer: Self-pay

## 2023-10-12 ENCOUNTER — Other Ambulatory Visit (HOSPITAL_BASED_OUTPATIENT_CLINIC_OR_DEPARTMENT_OTHER): Payer: Self-pay

## 2023-10-15 ENCOUNTER — Other Ambulatory Visit (HOSPITAL_BASED_OUTPATIENT_CLINIC_OR_DEPARTMENT_OTHER): Payer: Self-pay

## 2023-10-22 ENCOUNTER — Other Ambulatory Visit (HOSPITAL_BASED_OUTPATIENT_CLINIC_OR_DEPARTMENT_OTHER): Payer: Self-pay

## 2023-10-22 ENCOUNTER — Other Ambulatory Visit: Payer: Self-pay

## 2023-10-22 MED ORDER — AMPHETAMINE-DEXTROAMPHET ER 25 MG PO CP24
25.0000 mg | ORAL_CAPSULE | Freq: Every morning | ORAL | 0 refills | Status: DC
  Filled 2023-10-22 – 2023-10-26 (×3): qty 30, 30d supply, fill #0

## 2023-10-23 ENCOUNTER — Other Ambulatory Visit
Admission: RE | Admit: 2023-10-23 | Discharge: 2023-10-23 | Disposition: A | Discharge status: Home / Self Care | Attending: Psychiatry | Admitting: Psychiatry

## 2023-10-23 ENCOUNTER — Other Ambulatory Visit: Payer: Self-pay | Admitting: Psychiatry

## 2023-10-23 ENCOUNTER — Ambulatory Visit (INDEPENDENT_AMBULATORY_CARE_PROVIDER_SITE_OTHER): Admitting: Professional Counselor

## 2023-10-23 DIAGNOSIS — F3132 Bipolar disorder, current episode depressed, moderate: Secondary | ICD-10-CM

## 2023-10-23 DIAGNOSIS — Z79899 Other long term (current) drug therapy: Secondary | ICD-10-CM | POA: Insufficient documentation

## 2023-10-23 MED ORDER — LAMOTRIGINE 200 MG PO TABS
200.0000 mg | ORAL_TABLET | Freq: Every day | ORAL | 5 refills | Status: AC
  Filled 2023-10-23 – 2023-11-12 (×2): qty 30, 30d supply, fill #0
  Filled 2023-12-10: qty 30, 30d supply, fill #1
  Filled 2024-01-07 – 2024-01-29 (×3): qty 30, 30d supply, fill #2
  Filled 2024-02-28: qty 30, 30d supply, fill #3

## 2023-10-23 MED ORDER — FLUOXETINE HCL 40 MG PO CAPS
40.0000 mg | ORAL_CAPSULE | Freq: Every day | ORAL | 5 refills | Status: AC
  Filled 2023-10-23 – 2023-11-12 (×2): qty 30, 30d supply, fill #0
  Filled 2023-12-10: qty 30, 30d supply, fill #1
  Filled 2024-01-07 – 2024-01-29 (×2): qty 30, 30d supply, fill #2
  Filled 2024-02-28: qty 30, 30d supply, fill #3

## 2023-10-23 NOTE — Progress Notes (Signed)
  THERAPIST PROGRESS NOTE  Session Time: 9:03 AM - 9:56 AM   Participation Level: Active  Behavioral Response: Well Groomed, Alert, Dysphoric  Type of Therapy: Individual Therapy  Treatment Goals addressed: Active BH CCP BIPOLAR DISORDER   LTG: "I feel like I don't feel. I don't feel like I have true emotion, compassion, empathy. I can fake it really, really good, but I don't have it. I know when you're supposed to cry or when to laugh but I don't feel it and that scares me.                Start:  09/24/23    Expected End:  09/22/24      STG: "Accept where I am in my life. I feel like I have nothing and I am nothing." To improve core beliefs about self AEB improvement in GAD7/PHQ9 scores over the next 12 weeks.     STG: "I feel like there's so much wrong in here (mind)." To improve mindset AEB identifying and restructuring maladaptive patterns of thinking over the next 12 weeks.     STG: "I can almost identify situations that led me to the no-feeling." To reduce impact of trauma AEB processing and restructuring maladaptive patterns of thinking to reduce PCL scores over the next 12 weeks.   ProgressTowards Goals: Progressing  Interventions: CBT, Motivational Interviewing, and Supportive  Summary: BYRON TIPPING is a 53 y.o. female who presents with a history of bipolar disorder. She appeared alert and oriented x5. She shared the thought log she completed as homework. Spring noted updates on her relationship with her husband and conflicting feelings about returning home to be closer to her father. She was receptive to cognitive distortions and can see how she uses these patterns of thinking. She expressed understanding about homework assignment.  Therapist Response: Conducted session with Spring. Began session with check-in/update since previous session. Utilized empathetic and reflective listening. Provided psychoeducation on cognitive model and cognitive distortions. Explored Spring's  thoughts/feelings related to her husband and father. Identified themes, such as having better relationships with men rather than women in her life, and altruistic behavior for strangers rather than loved ones. Assigned homework to identify thoughts as distortions and restructure thinking in more accurate/healthy way. Scheduled additional appointment and concluded session.   Suicidal/Homicidal: No  Plan: Return again in 2 weeks.  Diagnosis: Bipolar affective disorder, currently depressed, moderate (HCC)  Collaboration of Care: Medication Management AEB chart review  Patient/Guardian was advised Release of Information must be obtained prior to any record release in order to collaborate their care with an outside provider. Patient/Guardian was advised if they have not already done so to contact the registration department to sign all necessary forms in order for us  to release information regarding their care.   Consent: Patient/Guardian gives verbal consent for treatment and assignment of benefits for services provided during this visit. Patient/Guardian expressed understanding and agreed to proceed.   Len Quale, [facility] 10/23/2023

## 2023-10-24 ENCOUNTER — Other Ambulatory Visit (HOSPITAL_BASED_OUTPATIENT_CLINIC_OR_DEPARTMENT_OTHER): Payer: Self-pay

## 2023-10-26 ENCOUNTER — Other Ambulatory Visit (HOSPITAL_BASED_OUTPATIENT_CLINIC_OR_DEPARTMENT_OTHER): Payer: Self-pay

## 2023-10-27 ENCOUNTER — Ambulatory Visit: Payer: Self-pay | Admitting: Psychiatry

## 2023-10-29 NOTE — Progress Notes (Signed)
 Virtual Visit via Video Note  I connected with Ashley Franklin on 11/05/23 at  9:00 AM EDT by a video enabled telemedicine application and verified that I am speaking with the correct person using two identifiers.  Location: Patient: home Provider: home office Persons participated in the visit- patient, provider    I discussed the limitations of evaluation and management by telemedicine and the availability of in person appointments. The patient expressed understanding and agreed to proceed.    I discussed the assessment and treatment plan with the patient. The patient was provided an opportunity to ask questions and all were answered. The patient agreed with the plan and demonstrated an understanding of the instructions.   The patient was advised to call back or seek an in-person evaluation if the symptoms worsen or if the condition fails to improve as anticipated.   Katheren Sleet, MD    [facility] MD/PA/NP OP Progress Note  11/05/2023 9:39 AM Ashley Franklin  MRN:  [id number]  Chief Complaint:  Chief Complaint  Patient presents with   Follow-up   HPI:  This is a follow-up appointment for bipolar disorder, anxiety and insomnia.  She states that she does not feel good physically.  She has nausea and diarrhea for the past few days.  She agrees to contact her PCP if she is unable to hydrate/any worsening in the next few days.  She states that she is doing very well at the new work.  It has been going exceptionally well according to the first review.  She enjoys the work, and reports good people at the work.  She is still trying to adjust her work schedule.  She occasionally feels that she is taking too much advantage of the day off, trying to relax.  She has extreme pace at work, up to 6 hours without sitting.  She is also considering whether or not to go back to [place]  as she misses her father a lot.  Although she is trying to work on the relationship with her husband, and they talk every  day, she states that he is making decision was influence from his family and friends.  She states that he said horrible things about her previously.  She states that her mood has been strong.  She sleeps up to 5 hours, and has initial insomnia.  She has been using delta 8 for this.  After psycho education was provided regarding the possible negative impact on her mental health, she is willing to stay away from this. She feels less anxious, and has been able to limit the use of clonazepam  up to a few times per week.  She denies decreased need for sleep or euphoria.  She denies SI, HI, hallucinations.  She agrees with the plans as outlined below.    BP 120/72 (BP Location: Left arm)  Pulse 78  Ht 1.638 m (5' 4.5)  Wt 94.5 kg (208 lb 6.4 oz)  SpO2 98%  BMI 35.22 kg/m  07/2023   Wt Readings from Last 3 Encounters:  01/15/23 240 lb (108.9 kg)  01/01/23 250 lb 12.8 oz (113.8 kg)  11/08/21 205 lb 9.6 oz (93.3 kg)     Visit Diagnosis:    ICD-10-CM   1. Bipolar affective disorder, currently depressed, mild (HCC)  F31.31     2. Anxiety disorder, unspecified type  F41.9     3. Insomnia, unspecified type  G47.00     4. High risk medication use  Z79.899  Past Psychiatric History: Please see initial evaluation for full details. I have reviewed the history. No updates at this time.     Past Medical History:  Past Medical History:  Diagnosis Date   Anemia    Anxiety    Bipolar affective disorder (HCC)    Blood transfusion without reported diagnosis    Bronchospasm with bronchitis, acute    Depression    Diabetes (HCC)    diet controlled   Kidney stone    Obesity    Right carpal tunnel syndrome 12/09/2018   SBO (small bowel obstruction) (HCC) 01/2014   Sleep apnea    uses CPAP    Past Surgical History:  Procedure Laterality Date   CARPAL TUNNEL RELEASE Right 08/05/2019   Procedure: RIGHT CARPAL TUNNEL RELEASE;  Surgeon: Murrell Kuba, MD;  Location: [facility];   Service: Orthopedics;  Laterality: Right;  IV REGIONAL FOREARM BLOCK   CESAREAN SECTION  1994   ENDOMETRIAL ABLATION     GASTRIC BYPASS  2001   KIDNEY STONE SURGERY  2010   SBO  2015    Family Psychiatric History: Please see initial evaluation for full details. I have reviewed the history. No updates at this time.     Family History:  Family History  Problem Relation Age of Onset   Diabetes Mother    Cancer Mother 88       Throat    Bipolar disorder Mother    Kidney disease Mother    Heart disease Father    Bipolar disorder Sister    Bipolar disorder Sister    Bipolar disorder Brother    Drug abuse Brother    Colon cancer Maternal Aunt    Kidney disease Maternal Grandmother    Heart disease Paternal Grandfather    Cancer - Prostate Neg Hx    Breast cancer Neg Hx    Esophageal cancer Neg Hx    Rectal cancer Neg Hx    Stomach cancer Neg Hx     Social History:  Social History   Socioeconomic History   Marital status: Married    Spouse name: Not on file   Number of children: 2   Years of education: Not on file   Highest education level: Not on file  Occupational History   Occupation: office administor  Tobacco Use   Smoking status: Former    Current packs/day: 0.25    Average packs/day: 0.3 packs/day for 6.0 years (1.5 ttl pk-yrs)    Types: Cigarettes   Smokeless tobacco: Never   Tobacco comments:    1/2 ppd   Vaping Use   Vaping status: Never Used  Substance and Sexual Activity   Alcohol use: Yes    Alcohol/week: 0.0 standard drinks of alcohol    Comment: Two times a week.    Drug use: No   Sexual activity: Yes    Partners: Male    Birth control/protection: Surgical  Other Topics Concern   Not on file  Social History Narrative      1 son born 1988-08-13 daughter born in 24 she is married and lives with her husband   Prior smoker not current 3 caffeinated beverages a day no alcohol tobacco or drug use      2 independent teachers    Pharmacy  schools, music teacher   Right handed    Caffeine 2 cups daily    Social Drivers of Health   Financial Resource Strain: High Risk (09/10/2023)   Overall Financial Resource  Strain (CARDIA)    Difficulty of Paying Living Expenses: Hard  Food Insecurity: No Food Insecurity (09/10/2023)   Hunger Vital Sign    Worried About Running Out of Food in the Last Year: Never true    Ran Out of Food in the Last Year: Never true  Transportation Needs: No Transportation Needs (09/10/2023)   PRAPARE - Administrator, Civil Service (Medical): No    Lack of Transportation (Non-Medical): No  Physical Activity: Sufficiently Active (09/10/2023)   Exercise Vital Sign    Days of Exercise per Week: 5 days    Minutes of Exercise per Session: 150+ min  Stress: Stress Concern Present (09/10/2023)   [employer] of Occupational Health - Occupational Stress Questionnaire    Feeling of Stress : Very much  Social Connections: Socially Isolated (09/10/2023)   Social Connection and Isolation Panel    Frequency of Communication with Friends and Family: Twice a week    Frequency of Social Gatherings with Friends and Family: Once a week    Attends Religious Services: Never    Database administrator or Organizations: No    Attends Banker Meetings: Never    Marital Status: Separated    Allergies:  Allergies  Allergen Reactions   Nsaids Other (See Comments)    Contraindicated with gastric bypass    Metabolic Disorder Labs: Lab Results  Component Value Date   HGBA1C 4.9 02/06/2017   MPG 105 02/26/2015   No results found for: PROLACTIN Lab Results  Component Value Date   CHOL 165 02/06/2017   TRIG 87 02/06/2017   HDL 54 02/06/2017   LDLCALC 94 02/06/2017   Lab Results  Component Value Date   TSH 1.12 07/10/2017   TSH 1.22 06/08/2016    Therapeutic Level Labs: No results found for: LITHIUM Lab Results  Component Value Date   VALPROATE 67 03/27/2022   VALPROATE 62  11/29/2020   No results found for: CBMZ  Current Medications: Current Outpatient Medications  Medication Sig Dispense Refill   traZODone  (DESYREL ) 50 MG tablet Take 0.5-1 tablets (25-50 mg total) by mouth at bedtime as needed for sleep. 30 tablet 1   acetaminophen  (TYLENOL ) 500 MG tablet Take 2 tablets (1,000 mg total) by mouth every 6 (six) hours as needed for moderate pain. 30 tablet 0   amphetamine -dextroamphetamine  (ADDERALL  XR) 25 MG 24 hr capsule Take 1 capsule (25 mg total) by mouth every morning. 30 capsule 0   amphetamine -dextroamphetamine  (ADDERALL  XR) 25 MG 24 hr capsule Take 1 capsule (25 mg total) by mouth every morning. 30 capsule 0   amphetamine -dextroamphetamine  (ADDERALL  XR) 25 MG 24 hr capsule Take 1 capsule by mouth every morning. 30 capsule 0   amphetamine -dextroamphetamine  (ADDERALL  XR) 25 MG 24 hr capsule Take 1 capsule by mouth every morning. 30 capsule 0   Armodafinil  250 MG tablet Take 1 tablet (250 mg total) by mouth daily 30 tablet 5   Armodafinil  250 MG tablet Take 1 tablet (250 mg total) by mouth daily. 30 tablet 5   Armodafinil  250 MG tablet Take 1 tablet (250 mg total) by mouth daily. 30 tablet 5   atorvastatin  (LIPITOR) 20 MG tablet Take 1 tablet (20 mg total) by mouth at bedtime. 90 tablet 0   clonazePAM  (KLONOPIN ) 0.5 MG tablet Take 1 tablet (0.5 mg total) by mouth 2 (two) times daily as needed for anxiety. 60 tablet 1   cyclobenzaprine  (FLEXERIL ) 10 MG tablet Take 1 tablet (10 mg total) by  mouth 2 (two) times daily as needed for muscle spasms. 20 tablet 0   Ferrous Fumarate (HEMOCYTE - 106 MG FE) 324 (106 Fe) MG TABS tablet Take 1 tablet by mouth.     FLUoxetine  (PROZAC ) 40 MG capsule Take 1 capsule (40 mg total) by mouth daily. 30 capsule 5   lamoTRIgine  (LAMICTAL ) 150 MG tablet Take 1 tablet (150 mg total) by mouth every evening. 30 tablet 3   lamoTRIgine  (LAMICTAL ) 200 MG tablet Take 1 tablet (200 mg total) by mouth daily. 30 tablet 5   lubiprostone   (AMITIZA ) 24 MCG capsule Take 1 capsule (24 mcg total) by mouth 2 (two) times daily. 120 capsule 3   lurasidone  (LATUDA ) 80 MG TABS tablet Take 1 tablet (80 mg total) by mouth daily with breakfast. 30 tablet 3   omeprazole  (PRILOSEC) 20 MG capsule Take 1 capsule (20 mg total) by mouth 2 (two) times daily before a meal. Best to take on an empty stomach. 90 capsule 1   omeprazole  (PRILOSEC) 20 MG capsule Take 1 capsule (20 mg total) by mouth 2 (two) times daily as needed. 180 capsule 1   Semaglutide -Weight Management (WEGOVY ) 2.4 MG/0.75ML SOAJ Inject 2.4 mg into the skin once a week. 9 mL 0   valACYclovir  (VALTREX ) 500 MG tablet Take 1 tablet (500 mg total) by mouth 2 (two) times daily for 3 days as needed for vulvar lesions. 60 tablet 5   No current facility-administered medications for this visit.     Musculoskeletal: Strength & Muscle Tone: N/A Gait & Station: N/A Patient leans: N/A  Psychiatric Specialty Exam: Review of Systems  Psychiatric/Behavioral:  Positive for sleep disturbance. Negative for agitation, behavioral problems, confusion, decreased concentration, dysphoric mood, hallucinations, self-injury and suicidal ideas. The patient is nervous/anxious. The patient is not hyperactive.   All other systems reviewed and are negative.   There were no vitals taken for this visit.There is no height or weight on file to calculate BMI.  General Appearance: Well Groomed  Eye Contact:  Good  Speech:  Clear and Coherent  Volume:  Normal  Mood:  exceptionally well  Affect:  Appropriate, Congruent, and slightly fatigue  Thought Process:  Coherent  Orientation:  Full (Time, Place, and Person)  Thought Content: Logical   Suicidal Thoughts:  No  Homicidal Thoughts:  No  Memory:  Immediate;   Good  Judgement:  Good  Insight:  Good  Psychomotor Activity:  Normal  Concentration:  Concentration: Good and Attention Span: Good  Recall:  Good  Fund of Knowledge: Good  Language: Good   Akathisia:  No  Handed:  Right  AIMS (if indicated): not done  Assets:  Communication Skills Desire for Improvement  ADL's:  Intact  Cognition: WNL  Sleep:  Poor   Screenings: GAD-7    Advertising copywriter from 09/10/2023 in [facility] Office Visit from 01/01/2023 in [facility]  Total GAD-7 Score 18 19   PHQ2-9    Flowsheet Row Counselor from 09/10/2023 in [facility] Office Visit from 01/01/2023 in [facility] Office Visit from 11/08/2021 in [facility] Video Visit from 06/03/2021 in [facility] Video Visit from 02/25/2021 in [facility]  PHQ-2 Total Score 4 6 4  0 0  PHQ-9 Total Score 12 23 15  -- --   Advertising copywriter from 09/10/2023 in [facility] from 01/15/2023 in [facility] Office Visit from 01/01/2023 in [facility]  C-SSRS RISK CATEGORY Low Risk No Risk Error: Q3, 4, or 5 should not be populated when Q2 is No     Assessment and Plan:  Ashley Franklin is a 53 y.o. year old female with a history of bipolar disorder, anxiety, anemia, obesity s/p gastric bypass surgery, who presents for follow up appointment for below.   1. Bipolar affective disorder, currently depressed, mild (HCC) 2. Anxiety disorder, unspecified type # r/o PTSD Acute stressors include: work related stress, loneliness, brother with substance use, who recently had MVA, her son, who had alcohol withdrawal/seizure Other stressors include: separation,  her mother with cancer in spine    History:Tx from Dr. Arfeen. History of  impulsive sexually inappropriate behavior, irritability, anger, grandiosity  when her husband left in 2010 Admitted in 2015 for depression. Originally on stelazine , trileptal  300 mg TID, fluoxetine  60 mg, amitriptyline  25 mg TID, valium  5 mg TID, Ambien  10 mg qhs     There has been consistent improvement in depressive symptoms and anxiety, PTSD symptoms since the previous visit.  She reports receiving positive feedback at the current work.  While she is working on the relationship with her husband, she is also concerned to return to [place]  to be closer to her father.  Will continue lamotrigine  and Latuda  for bipolar depression.  Will continue fluoxetine  for bipolar depression and anxiety.  Will continue clonazepam  as needed for anxiety. She will greatly benefit from CBT; she will continue to see Ms. Veva for therapy.   3. Insomnia, unspecified type She has been using delta 8 for initial insomnia.  Psychoeducation was provided regarding possible risk of THC on her mental health.  She is willing to be abstinent from it.  Will start trazodone  as needed for insomnia.  Discussed potential risk of drowsiness.   4. High risk medication use - UDS cannabinoid positive, 10/2023  As outlined above, she agrees to refrain from delta eight use.    # fatigue  - ferritin 6 04/2023 Unchanged.  She is advised to take iron tablets along with vitamin C, and iron rich food.  She previously had limited benefit from oral iron, and had a history of infusion.  She agreed to discuss this with her primary care. Will follow this up on her next visit.       Last checked  EKG HR 85, QTc454 msec 11/2022  Lipid panels LDL 86 04/2023  HbA1c 5.2 07/7973       Plan:  Continue lamotrigine  200 mg daily, 150 mg at night - uptitrated 07/13/2023 Continue Latuda  80 mg daily - akathisia from 100 mg  Continue fluoxetine  40 mg daily  Continue  L-methyl folate 15 mg daily  Continue clonazepam  0.5 mg twice as needed for anxiety - a refill left. She uses this a few times per week Start trazodone  25-50 mg at  night as needed for sleep Next appointment: 8/1 at 9 30 for 30 mins, video - on Adderall  XR 25 mg daily, armodafinil  for narcolepsy - on wegovy     Past trials of medication: Lexapro, Paxil, amitriptyline , Abilify  (irritable), oxcarbazepine , perphenazine, Trifluoperazine , Klonopin , Valium . Vistaril , Ambien     The patient demonstrates the following  risk factors for suicide: Chronic risk factors for suicide include psychiatric disorder /bipolar disorder, previous self-harm of scratching herself. Acute risk factors for suicide include none. Protective factors for  this patient include positive social support, positive therapeutic relationship, hope for the future. Considering these factors, the overall suicide risk at this point appears to be moderate, but not imminent. Patient denies gun access. Discussed in detail safety plan that anytime having active suicidal thoughts or homicidal thoughts and she need to call 911 or go to local emergency room  Collaboration of Care: Collaboration of Care: Other reviewed notes in Epic  Patient/Guardian was advised Release of Information must be obtained prior to any record release in order to collaborate their care with an outside provider. Patient/Guardian was advised if they have not already done so to contact the registration department to sign all necessary forms in order for us  to release information regarding their care.   Consent: Patient/Guardian gives verbal consent for treatment and assignment of benefits for services provided during this visit. Patient/Guardian expressed understanding and agreed to proceed.    Katheren Sleet, MD 11/05/2023, 9:39 AM

## 2023-11-05 ENCOUNTER — Encounter: Payer: Self-pay | Admitting: Psychiatry

## 2023-11-05 ENCOUNTER — Telehealth (INDEPENDENT_AMBULATORY_CARE_PROVIDER_SITE_OTHER): Admitting: Psychiatry

## 2023-11-05 ENCOUNTER — Other Ambulatory Visit (HOSPITAL_BASED_OUTPATIENT_CLINIC_OR_DEPARTMENT_OTHER): Payer: Self-pay

## 2023-11-05 DIAGNOSIS — Z79899 Other long term (current) drug therapy: Secondary | ICD-10-CM | POA: Diagnosis not present

## 2023-11-05 DIAGNOSIS — G4700 Insomnia, unspecified: Secondary | ICD-10-CM

## 2023-11-05 DIAGNOSIS — F419 Anxiety disorder, unspecified: Secondary | ICD-10-CM | POA: Diagnosis not present

## 2023-11-05 DIAGNOSIS — F3131 Bipolar disorder, current episode depressed, mild: Secondary | ICD-10-CM

## 2023-11-05 MED ORDER — TRAZODONE HCL 50 MG PO TABS
25.0000 mg | ORAL_TABLET | Freq: Every evening | ORAL | 1 refills | Status: DC | PRN
  Filled 2023-11-05: qty 30, 30d supply, fill #0

## 2023-11-05 NOTE — Patient Instructions (Signed)
 Continue lamotrigine  200 mg daily, 150 mg at night Continue Latuda  80 mg daily  Continue fluoxetine  40 mg daily  Continue  L-methyl folate 15 mg daily  Continue clonazepam  0.5 mg twice as needed for anxiety  Start trazodone  25-50 mg at night as needed for sleep Next appointment: 8/1 at 9 30

## 2023-11-06 ENCOUNTER — Ambulatory Visit: Admitting: Professional Counselor

## 2023-11-09 ENCOUNTER — Other Ambulatory Visit (HOSPITAL_BASED_OUTPATIENT_CLINIC_OR_DEPARTMENT_OTHER): Payer: Self-pay

## 2023-11-12 ENCOUNTER — Other Ambulatory Visit (HOSPITAL_BASED_OUTPATIENT_CLINIC_OR_DEPARTMENT_OTHER): Payer: Self-pay

## 2023-11-13 ENCOUNTER — Other Ambulatory Visit (HOSPITAL_BASED_OUTPATIENT_CLINIC_OR_DEPARTMENT_OTHER): Payer: Self-pay

## 2023-11-14 ENCOUNTER — Other Ambulatory Visit (HOSPITAL_BASED_OUTPATIENT_CLINIC_OR_DEPARTMENT_OTHER): Payer: Self-pay

## 2023-11-14 MED ORDER — ATORVASTATIN CALCIUM 20 MG PO TABS
20.0000 mg | ORAL_TABLET | Freq: Every day | ORAL | 1 refills | Status: AC
  Filled 2023-11-14: qty 90, 90d supply, fill #0
  Filled 2024-02-11 – 2024-03-11 (×3): qty 90, 90d supply, fill #1

## 2023-11-15 ENCOUNTER — Other Ambulatory Visit (HOSPITAL_BASED_OUTPATIENT_CLINIC_OR_DEPARTMENT_OTHER): Payer: Self-pay

## 2023-11-20 ENCOUNTER — Other Ambulatory Visit: Payer: Self-pay | Admitting: Psychiatry

## 2023-11-20 ENCOUNTER — Ambulatory Visit (INDEPENDENT_AMBULATORY_CARE_PROVIDER_SITE_OTHER): Admitting: Professional Counselor

## 2023-11-20 ENCOUNTER — Other Ambulatory Visit (HOSPITAL_BASED_OUTPATIENT_CLINIC_OR_DEPARTMENT_OTHER): Payer: Self-pay

## 2023-11-20 DIAGNOSIS — F3131 Bipolar disorder, current episode depressed, mild: Secondary | ICD-10-CM

## 2023-11-20 MED ORDER — LURASIDONE HCL 80 MG PO TABS
80.0000 mg | ORAL_TABLET | Freq: Every day | ORAL | 3 refills | Status: AC
  Filled 2023-11-20: qty 30, 30d supply, fill #0
  Filled 2024-01-07 – 2024-01-29 (×2): qty 30, 30d supply, fill #1
  Filled 2024-02-28: qty 30, 30d supply, fill #2

## 2023-11-20 NOTE — Progress Notes (Signed)
  THERAPIST PROGRESS NOTE  Session Time: 8:03 AM - 8:53 AM  Participation Level: Active  Behavioral Response: Well Groomed, Alert, Anxious  Type of Therapy: Individual Therapy  Treatment Goals addressed: Active BH CCP BIPOLAR DISORDER   LTG: I feel like I don't feel. I don't feel like I have true emotion, compassion, empathy. I can fake it really, really good, but I don't have it. I know when you're supposed to cry or when to laugh but I don't feel it and that scares me.                Start:  09/24/23    Expected End:  09/22/24      STG: Accept where I am in my life. I feel like I have nothing and I am nothing. To improve core beliefs about self AEB improvement in GAD7/PHQ9 scores over the next 12 weeks.     STG: I feel like there's so much wrong in here (mind). To improve mindset AEB identifying and restructuring maladaptive patterns of thinking over the next 12 weeks.     STG: I can almost identify situations that led me to the no-feeling. To reduce impact of trauma AEB processing and restructuring maladaptive patterns of thinking to reduce PCL scores over the next 12 weeks.   ProgressTowards Goals: Progressing  Interventions: CBT, Motivational Interviewing, and Supportive  Summary: Ashley Franklin is a 53 y.o. female who presents with a history of bipolar disorder and anxiety. She appeared alert and oriented x5. She stated she printed a new list of cognitive distortions and attempted to identify patterns she uses. She noted fallacies, emotional reasoning, catastrophizing, and overgeneralization as her main ones. Spring engaged in discussion about her marriage and continued conflict on whether to work harder or move back to [place] . She was receptive to [facility] worksheet and practiced with recent work incident. She expressed understanding about homework assignment.   Therapist Response: Conducted session with Spring. Began session with check-in/update since previous session.  Utilized empathetic and reflective listening. Used open-ended questions to facilitate discussion and summarized thoughts/feelings. Normalized cognitive distortions and explained ABC model to help challenge thinking. Highlighted pattern of emotional reasoning in session. Provided Spring with a feelings wheel to help identify emotions. Assigned homework to complete additional ABC worksheets. Scheduled additional appointment and concluded session.   Suicidal/Homicidal: No  Plan: Return again in 2 weeks.  Diagnosis: Bipolar affective disorder, currently depressed, mild (HCC)  Collaboration of Care: Medication Management AEB chart review  Patient/Guardian was advised Release of Information must be obtained prior to any record release in order to collaborate their care with an outside provider. Patient/Guardian was advised if they have not already done so to contact the registration department to sign all necessary forms in order for us  to release information regarding their care.   Consent: Patient/Guardian gives verbal consent for treatment and assignment of benefits for services provided during this visit. Patient/Guardian expressed understanding and agreed to proceed.   [name], [facility] 11/20/2023

## 2023-11-29 ENCOUNTER — Other Ambulatory Visit (HOSPITAL_BASED_OUTPATIENT_CLINIC_OR_DEPARTMENT_OTHER): Payer: Self-pay

## 2023-12-03 ENCOUNTER — Other Ambulatory Visit (HOSPITAL_BASED_OUTPATIENT_CLINIC_OR_DEPARTMENT_OTHER): Payer: Self-pay

## 2023-12-03 MED ORDER — WEGOVY 2.4 MG/0.75ML ~~LOC~~ SOAJ
2.4000 mg | SUBCUTANEOUS | 0 refills | Status: DC
  Filled 2023-12-03: qty 3, 28d supply, fill #0

## 2023-12-04 ENCOUNTER — Ambulatory Visit: Admitting: Professional Counselor

## 2023-12-04 ENCOUNTER — Other Ambulatory Visit (HOSPITAL_BASED_OUTPATIENT_CLINIC_OR_DEPARTMENT_OTHER): Payer: Self-pay

## 2023-12-08 NOTE — Progress Notes (Unsigned)
 Virtual Visit via Video Note  I connected with Ashley Franklin on 12/14/23 at  9:30 AM EDT by a video enabled telemedicine application and verified that I am speaking with the correct person using two identifiers.  Location: Patient: home Provider: home office Persons participated in the visit- patient, provider    I discussed the limitations of evaluation and management by telemedicine and the availability of in person appointments. The patient expressed understanding and agreed to proceed.  I discussed the assessment and treatment plan with the patient. The patient was provided an opportunity to ask questions and all were answered. The patient agreed with the plan and demonstrated an understanding of the instructions.   The patient was advised to call back or seek an in-person evaluation if the symptoms worsen or if the condition fails to improve as anticipated.    Katheren Sleet, MD     [facility] MD/PA/NP OP Progress Note  12/14/2023 10:04 AM Ashley Franklin  MRN:  [id number]  Chief Complaint:  Chief Complaint  Patient presents with   Follow-up   HPI:  This is a follow-up appointment for bipolar disorder, anxiety and insomnia.  She states that the work is going great, and she loves it.  She feels fit in there.  She is continuing communication with her husband.  He has been resistant to try couples therapy, stating that things will be good now that she is engaged in therapy and she has been doing better.  She was able to communicate her concern.  She is concerning to go to [place] , while waiting until December.  She has been trying not to think ahead.  The message from Ms. Veva resonated with her that she can only make decisions based on the information she has now.  She recognizes her previous pattern of constantly thinking about what if in the past.  She has been sleeping better.  Trazodone  has been very helpful, and she takes it a few times per week.  She denies feeling depressed.   She denies SI, HI, hallucinations.  She denies decreased need for sleep or euphoria.  She absolutely feels comfortable to stay on the current medication, and has agreed with the plans as outlined below.   Visit Diagnosis:    ICD-10-CM   1. Bipolar affective disorder, currently depressed, mild (HCC)  F31.31     2. Anxiety disorder, unspecified type  F41.9     3. Insomnia, unspecified type  G47.00       Past Psychiatric History: Please see initial evaluation for full details. I have reviewed the history. No updates at this time.     Past Medical History:  Past Medical History:  Diagnosis Date   Anemia    Anxiety    Bipolar affective disorder (HCC)    Blood transfusion without reported diagnosis    Bronchospasm with bronchitis, acute    Depression    Diabetes (HCC)    diet controlled   Kidney stone    Obesity    Right carpal tunnel syndrome 12/09/2018   SBO (small bowel obstruction) (HCC) 01/2014   Sleep apnea    uses CPAP    Past Surgical History:  Procedure Laterality Date   CARPAL TUNNEL RELEASE Right 08/05/2019   Procedure: RIGHT CARPAL TUNNEL RELEASE;  Surgeon: Murrell Kuba, MD;  Location: [facility];  Service: Orthopedics;  Laterality: Right;  IV REGIONAL FOREARM BLOCK   CESAREAN SECTION  1994   ENDOMETRIAL ABLATION     GASTRIC  BYPASS  2001   KIDNEY STONE SURGERY  2010   SBO  2015    Family Psychiatric History: Please see initial evaluation for full details. I have reviewed the history. No updates at this time.     Family History:  Family History  Problem Relation Age of Onset   Diabetes Mother    Cancer Mother 22       Throat    Bipolar disorder Mother    Kidney disease Mother    Heart disease Father    Bipolar disorder Sister    Bipolar disorder Sister    Bipolar disorder Brother    Drug abuse Brother    Colon cancer Maternal Aunt    Kidney disease Maternal Grandmother    Heart disease Paternal Grandfather    Cancer - Prostate Neg Hx     Breast cancer Neg Hx    Esophageal cancer Neg Hx    Rectal cancer Neg Hx    Stomach cancer Neg Hx     Social History:  Social History   Socioeconomic History   Marital status: Married    Spouse name: Not on file   Number of children: 2   Years of education: Not on file   Highest education level: Not on file  Occupational History   Occupation: office administor  Tobacco Use   Smoking status: Former    Current packs/day: 0.25    Average packs/day: 0.3 packs/day for 6.0 years (1.5 ttl pk-yrs)    Types: Cigarettes   Smokeless tobacco: Never   Tobacco comments:    1/2 ppd   Vaping Use   Vaping status: Never Used  Substance and Sexual Activity   Alcohol use: Yes    Alcohol/week: 0.0 standard drinks of alcohol    Comment: Two times a week.    Drug use: No   Sexual activity: Yes    Partners: Male    Birth control/protection: Surgical  Other Topics Concern   Not on file  Social History Narrative      1 son born 1988-08-13 daughter born in 83 she is married and lives with her husband   Prior smoker not current 3 caffeinated beverages a day no alcohol tobacco or drug use      2 independent teachers    Pharmacy schools, music teacher   Right handed    Caffeine 2 cups daily    Social Drivers of Health   Financial Resource Strain: High Risk (09/10/2023)   Overall Financial Resource Strain (CARDIA)    Difficulty of Paying Living Expenses: Hard  Food Insecurity: Low Risk  (11/29/2023)   Received from [facility]   Hunger Vital Sign    Within the past 12 months, you worried that your food would run out before you got money to buy more: Never true    Within the past 12 months, the food you bought just didn't last and you didn't have money to get more. : Never true  Transportation Needs: No Transportation Needs (11/29/2023)   Received from [facility]    In the past 12 months, has lack of reliable transportation kept you from medical appointments,  meetings, work or from getting things needed for daily living? : No  Physical Activity: Sufficiently Active (09/10/2023)   Exercise Vital Sign    Days of Exercise per Week: 5 days    Minutes of Exercise per Session: 150+ min  Stress: Stress Concern Present (09/10/2023)   [employer] of Occupational Health -  Occupational Stress Questionnaire    Feeling of Stress : Very much  Social Connections: Socially Isolated (09/10/2023)   Social Connection and Isolation Panel    Frequency of Communication with Friends and Family: Twice a week    Frequency of Social Gatherings with Friends and Family: Once a week    Attends Religious Services: Never    Database administrator or Organizations: No    Attends Banker Meetings: Never    Marital Status: Separated    Allergies:  Allergies  Allergen Reactions   Nsaids Other (See Comments)    Contraindicated with gastric bypass    Metabolic Disorder Labs: Lab Results  Component Value Date   HGBA1C 4.9 02/06/2017   MPG 105 02/26/2015   No results found for: PROLACTIN Lab Results  Component Value Date   CHOL 165 02/06/2017   TRIG 87 02/06/2017   HDL 54 02/06/2017   LDLCALC 94 02/06/2017   Lab Results  Component Value Date   TSH 1.12 07/10/2017   TSH 1.22 06/08/2016    Therapeutic Level Labs: No results found for: LITHIUM Lab Results  Component Value Date   VALPROATE 67 03/27/2022   VALPROATE 62 11/29/2020   No results found for: CBMZ  Current Medications: Current Outpatient Medications  Medication Sig Dispense Refill   acetaminophen  (TYLENOL ) 500 MG tablet Take 2 tablets (1,000 mg total) by mouth every 6 (six) hours as needed for moderate pain. 30 tablet 0   amphetamine -dextroamphetamine  (ADDERALL  XR) 25 MG 24 hr capsule Take 1 capsule (25 mg total) by mouth every morning. 30 capsule 0   amphetamine -dextroamphetamine  (ADDERALL  XR) 25 MG 24 hr capsule Take 1 capsule (25 mg total) by mouth every morning. 30  capsule 0   amphetamine -dextroamphetamine  (ADDERALL  XR) 25 MG 24 hr capsule Take 1 capsule by mouth every morning. 30 capsule 0   amphetamine -dextroamphetamine  (ADDERALL  XR) 25 MG 24 hr capsule Take 1 capsule by mouth every morning. 30 capsule 0   Armodafinil  250 MG tablet Take 1 tablet (250 mg total) by mouth daily 30 tablet 5   Armodafinil  250 MG tablet Take 1 tablet (250 mg total) by mouth daily. 30 tablet 5   Armodafinil  250 MG tablet Take 1 tablet (250 mg total) by mouth daily. 30 tablet 5   atorvastatin  (LIPITOR) 20 MG tablet Take 1 tablet (20 mg total) by mouth at bedtime. 90 tablet 1   clonazePAM  (KLONOPIN ) 0.5 MG tablet Take 1 tablet (0.5 mg total) by mouth 2 (two) times daily as needed for anxiety. 60 tablet 1   cyclobenzaprine  (FLEXERIL ) 10 MG tablet Take 1 tablet (10 mg total) by mouth 2 (two) times daily as needed for muscle spasms. 20 tablet 0   Ferrous Fumarate (HEMOCYTE - 106 MG FE) 324 (106 Fe) MG TABS tablet Take 1 tablet by mouth.     FLUoxetine  (PROZAC ) 40 MG capsule Take 1 capsule (40 mg total) by mouth daily. 30 capsule 5   lamoTRIgine  (LAMICTAL ) 150 MG tablet Take 1 tablet (150 mg total) by mouth every evening. 30 tablet 3   lamoTRIgine  (LAMICTAL ) 200 MG tablet Take 1 tablet (200 mg total) by mouth daily. 30 tablet 5   lubiprostone  (AMITIZA ) 24 MCG capsule Take 1 capsule (24 mcg total) by mouth 2 (two) times daily. 120 capsule 3   lurasidone  (LATUDA ) 80 MG TABS tablet Take 1 tablet (80 mg total) by mouth daily with breakfast. 30 tablet 3   omeprazole  (PRILOSEC) 20 MG capsule Take 1 capsule (20  mg total) by mouth 2 (two) times daily before a meal. Best to take on an empty stomach. 90 capsule 1   omeprazole  (PRILOSEC) 20 MG capsule Take 1 capsule (20 mg total) by mouth 2 (two) times daily as needed. 180 capsule 1   Semaglutide -Weight Management (WEGOVY ) 2.4 MG/0.75ML SOAJ Inject 2.4 mg into the skin once a week. 3 mL 0   traZODone  (DESYREL ) 50 MG tablet Take 0.5-1 tablets  (25-50 mg total) by mouth at bedtime as needed for sleep. 30 tablet 1   valACYclovir  (VALTREX ) 500 MG tablet Take 1 tablet (500 mg total) by mouth 2 (two) times daily for 3 days as needed for vulvar lesions. 60 tablet 5   No current facility-administered medications for this visit.     Musculoskeletal: Strength & Muscle Tone: N/A Gait & Station: N/A Patient leans: N/A  Psychiatric Specialty Exam: Review of Systems  Psychiatric/Behavioral:  Negative for agitation, behavioral problems, confusion, decreased concentration, dysphoric mood, hallucinations, self-injury, sleep disturbance and suicidal ideas. The patient is nervous/anxious. The patient is not hyperactive.   All other systems reviewed and are negative.   There were no vitals taken for this visit.There is no height or weight on file to calculate BMI.  General Appearance: Well Groomed  Eye Contact:  Good  Speech:  Clear and Coherent  Volume:  Normal  Mood:  good  Affect:  Appropriate, Congruent, and calm  Thought Process:  Coherent  Orientation:  Full (Time, Place, and Person)  Thought Content: Logical   Suicidal Thoughts:  No  Homicidal Thoughts:  No  Memory:  Immediate;   Good  Judgement:  Good  Insight:  Good  Psychomotor Activity:  Normal  Concentration:  Concentration: Good and Attention Span: Good  Recall:  Good  Fund of Knowledge: Good  Language: Good  Akathisia:  No  Handed:  Right  AIMS (if indicated): not done  Assets:  Communication Skills Desire for Improvement  ADL's:  Intact  Cognition: WNL  Sleep:  Good   Screenings: GAD-7    Flowsheet Row Counselor from 09/10/2023 in [facility] Office Visit from 01/01/2023 in [facility]  Total GAD-7 Score 18 19   PHQ2-9    Flowsheet Row Counselor from 09/10/2023 in [facility] Office Visit from 01/01/2023 in [facility] Office Visit from 11[facility]08/2021 in [facility] Video Visit from 06/03/2021 in [facility] Video Visit from 02/25/2021 in [facility]  PHQ-2 Total Score 4 6 4  0 0  PHQ-9 Total Score 12 23 15  -- --   Flowsheet Row Counselor from 09/10/2023 in [facility] ED from 01/15/2023 in [facility] Visit from 01/01/2023 in [facility]  C-SSRS RISK CATEGORY Low Risk No Risk Error: Q3, 4, or 5 should not be populated when Q2 is No     Assessment and Plan:  Ashley Franklin is a 53 y.o. year old female with a history of bipolar disorder, anxiety, anemia, obesity s/p gastric bypass surgery, who presents for follow up appointment for below.   1. Bipolar affective disorder, currently depressed, mild (HCC) 2. Anxiety disorder, unspecified type # r/o PTSD Acute stressors include: work related stress, loneliness, brother with substance use, who recently had MVA, her son, who had alcohol withdrawal/seizure Other stressors include:  separation,  her mother with cancer in spine    History:Tx from Dr. Arfeen. History of  impulsive sexually inappropriate behavior, irritability, anger, grandiosity when her husband left in 2010 Admitted in 2015 for depression. Originally on stelazine , trileptal  300 mg TID, fluoxetine  60 mg, amitriptyline  25 mg TID, valium  5 mg TID, Ambien  10 mg qhs     There has been steady improvement in depressive symptoms and anxiety since the previous visit.  She remains actively engaged in therapy and is currently awaiting a response from her husband, which will help her decide whether to relocate to [place]  to be closer to her family.  Will continue current medication regimen.  Will continue lamotrigine  and Latuda  for bipolar  depression.  Will continue fluoxetine  for anxiety, and bipolar depression off label.  Will continue clonazepam  as needed for anxiety.   3. Insomnia, unspecified type Significant improvement since starting trazodone , and she has been able to be abstinent from delta 8.  Will continue current dose of trazodone  as needed for insomnia.    4. High risk medication use - UDS cannabinoid positive, 10/2023    # iron deifiency  - ferritin 6 04/2023 Overall improvement.  Will follow up on her ferritin level at the next visit.        Last checked  EKG HR 85, QTc454 msec 11/2022  Lipid panels LDL 86 04/2023  HbA1c 5.2 07/7973       Plan:  Continue lamotrigine  200 mg daily, 150 mg at night - uptitrated 07/13/2023 Continue Latuda  80 mg daily - akathisia from 100 mg  Continue fluoxetine  40 mg daily  Continue  L-methyl folate 15 mg daily  Continue clonazepam  0.5 mg twice as needed for anxiety - a refill left. She uses this a few times per week Continue trazodone  50 mg at night as needed for sleep - take it a few times per week Next appointment: 9/19 at 9 30 for 30 mins, video - on Adderall  XR 25 mg daily, armodafinil  for narcolepsy - on wegovy     Past trials of medication: Lexapro, Paxil, amitriptyline , Abilify  (irritable), oxcarbazepine , perphenazine, Trifluoperazine , Klonopin , Valium . Vistaril , Ambien     The patient demonstrates the following  risk factors for suicide: Chronic risk factors for suicide include psychiatric disorder /bipolar disorder, previous self-harm of scratching herself. Acute risk factors for suicide include none. Protective factors for this patient include positive social support, positive therapeutic relationship, hope for the future. Considering these factors, the overall suicide risk at this point appears to be moderate, but not imminent. Patient denies gun access. Discussed in detail safety plan that anytime having active suicidal thoughts or homicidal thoughts and she need to  call 911 or go to local emergency room  Collaboration of Care: Collaboration of Care: Other reviewed notes in Epic  Patient/Guardian was advised Release of Information must be obtained prior to any record release in order to collaborate their care with an outside provider. Patient/Guardian was advised if they have not already done so to contact the registration department to sign all necessary forms in order for us  to release information regarding their care.   Consent: Patient/Guardian gives verbal consent for treatment and assignment of benefits for services provided during this visit. Patient/Guardian expressed understanding and agreed to proceed.    Katheren Sleet, MD 12/14/2023, 10:04 AM

## 2023-12-14 ENCOUNTER — Telehealth: Admitting: Psychiatry

## 2023-12-14 ENCOUNTER — Encounter: Payer: Self-pay | Admitting: Psychiatry

## 2023-12-14 DIAGNOSIS — F3131 Bipolar disorder, current episode depressed, mild: Secondary | ICD-10-CM | POA: Diagnosis not present

## 2023-12-14 DIAGNOSIS — F419 Anxiety disorder, unspecified: Secondary | ICD-10-CM

## 2023-12-14 DIAGNOSIS — G4700 Insomnia, unspecified: Secondary | ICD-10-CM

## 2023-12-14 NOTE — Patient Instructions (Signed)
 Continue lamotrigine  200 mg daily, 150 mg at night  Continue Latuda  80 mg daily  Continue fluoxetine  40 mg daily  Continue  L-methyl folate 15 mg daily  Continue clonazepam  0.5 mg twice as needed for anxiety Continue trazodone  50 mg at night as needed for sleep  Next appointment: 9/19 at 9 30

## 2023-12-18 ENCOUNTER — Ambulatory Visit (INDEPENDENT_AMBULATORY_CARE_PROVIDER_SITE_OTHER): Admitting: Professional Counselor

## 2023-12-18 DIAGNOSIS — F3131 Bipolar disorder, current episode depressed, mild: Secondary | ICD-10-CM | POA: Diagnosis not present

## 2023-12-18 DIAGNOSIS — F419 Anxiety disorder, unspecified: Secondary | ICD-10-CM

## 2023-12-18 NOTE — Progress Notes (Signed)
  THERAPIST PROGRESS NOTE  Session Time: 10:05 AM - 10:55 AM  Participation Level: Active  Behavioral Response: Casual, Alert, Dysphoric  Type of Therapy: Individual Therapy  Treatment Goals addressed: Active BH CCP BIPOLAR DISORDER   LTG: I feel like I don't feel. I don't feel like I have true emotion, compassion, empathy. I can fake it really, really good, but I don't have it. I know when you're supposed to cry or when to laugh but I don't feel it and that scares me.                Start:  09/24/23    Expected End:  09/22/24      STG: Accept where I am in my life. I feel like I have nothing and I am nothing. To improve core beliefs about self AEB improvement in GAD7/PHQ9 scores over the next 12 weeks.     STG: I feel like there's so much wrong in here (mind). To improve mindset AEB identifying and restructuring maladaptive patterns of thinking over the next 12 weeks.     STG: I can almost identify situations that led me to the no-feeling. To reduce impact of trauma AEB processing and restructuring maladaptive patterns of thinking to reduce PCL scores over the next 12 weeks.   ProgressTowards Goals: Progressing  Interventions: CBT and Supportive  Summary: Ashley Franklin is a 53 y.o. female who presents with a history of bipolar disorder and anxiety. She appeared somber but oriented x5. She reported she has been in a funk. She asked her estranged husband about couples counseling and he declined. Ashley Franklin noted she will likely move back to [place]  to be closer to her father. However, she has been avoiding him this week. She engaged in completing ABC worksheet and processed various cognitive distortions that may be keeping her stuck. She was receptive to [facility] MIND and mindfulness skills although she struggles to focus on what small changes she can make. She identified checking her email as something she can do today.   Therapist Response: Conducted session with Ashley Franklin. Began  session with check-in/update since previous session. Utilized empathetic and reflective listening. Used open-ended questions to facilitate discussion and summarized thoughts/feelings. Highlighted unhealthy patterns of thinking (jumping to conclusions, mind reading, all/nothing thinking) and engaged in completing ABC worksheet. Explained WISE MIND to balance emotion and logic/reason. Encouraged Ashley Franklin to connect with her emotions, even negative ones. Used mindfulness to focus on today and small changes. Scheduled additional appointment and concluded session.   Suicidal/Homicidal: No  Plan: Return again in 2 weeks.  Diagnosis: Bipolar affective disorder, currently depressed, mild (HCC)  Anxiety disorder, unspecified type  Collaboration of Care: Medication Management AEB chart review  Patient/Guardian was advised Release of Information must be obtained prior to any record release in order to collaborate their care with an outside provider. Patient/Guardian was advised if they have not already done so to contact the registration department to sign all necessary forms in order for us  to release information regarding their care.   Consent: Patient/Guardian gives verbal consent for treatment and assignment of benefits for services provided during this visit. Patient/Guardian expressed understanding and agreed to proceed.   Ashley Franklin, [facility] 12/18/2023

## 2024-01-02 ENCOUNTER — Ambulatory Visit: Admitting: Professional Counselor

## 2024-01-02 NOTE — Progress Notes (Deleted)
  THERAPIST PROGRESS NOTE  Session Time: 10:00 AM -   Participation Level: {BHH PARTICIPATION LEVEL:22264}  Behavioral Response: {Appearance:22683}{BHH LEVEL OF CONSCIOUSNESS:22305}{BHH MOOD:22306}  Type of Therapy: Individual Therapy  Treatment Goals addressed: ***  ProgressTowards Goals: {Progress Towards Goals:21014066}  Interventions: {CHL AMB BH Type of Intervention:21022753}  Summary: Ashley Franklin is a 53 y.o. female who presents with ***.   Suicidal/Homicidal: {BHH YES OR NO:22294}{yes/no/with/without intent/plan:22693}  Therapist Response: Conducted session with . Began session with check-in/update since previous session. Utilized empathetic and reflective listening. Used open-ended questions to facilitate discussion and summarized thoughts/feelings. Scheduled additional appointment and concluded session.   Plan: Return again in *** weeks.  Diagnosis: Bipolar affective disorder, currently depressed, mild (HCC)  Anxiety disorder, unspecified type  Collaboration of Care: Medication Management AEB chart review  Patient/Guardian was advised Release of Information must be obtained prior to any record release in order to collaborate their care with an outside provider. Patient/Guardian was advised if they have not already done so to contact the registration department to sign all necessary forms in order for us  to release information regarding their care.   Consent: Patient/Guardian gives verbal consent for treatment and assignment of benefits for services provided during this visit. Patient/Guardian expressed understanding and agreed to proceed.   Ashley Franklin, [facility] 01/02/2024

## 2024-01-07 ENCOUNTER — Other Ambulatory Visit (HOSPITAL_BASED_OUTPATIENT_CLINIC_OR_DEPARTMENT_OTHER): Payer: Self-pay

## 2024-01-07 ENCOUNTER — Other Ambulatory Visit: Payer: Self-pay

## 2024-01-23 ENCOUNTER — Ambulatory Visit: Admitting: Professional Counselor

## 2024-01-27 NOTE — Progress Notes (Signed)
 Virtual Visit via Video Note  I connected with Ashley Franklin on 02/01/24 at  9:30 AM EDT by a video enabled telemedicine application and verified that I am speaking with the correct person using two identifiers.  Location: Patient: outside Provider: home office Persons participated in the visit- patient, provider    I discussed the limitations of evaluation and management by telemedicine and the availability of in person appointments. The patient expressed understanding and agreed to proceed.    I discussed the assessment and treatment plan with the patient. The patient was provided an opportunity to ask questions and all were answered. The patient agreed with the plan and demonstrated an understanding of the instructions.   The patient was advised to call back or seek an in-person evaluation if the symptoms worsen or if the condition fails to improve as anticipated.   Katheren Sleet, MD    [facility] MD/PA/NP OP Progress Note  02/01/2024 10:01 AM Ashley Franklin  MRN:  [id number]  Chief Complaint:  Chief Complaint  Patient presents with   Follow-up   HPI:  This is a follow-up appointment for bipolar disorder, anxiety and insomnia.  She states that she has been doing very well.  She has restarted to school, which she has been since last May.  It has been going very well.  She thinks the current workplace is a life saver.  She works for [employer].  She likes the speed.  She has been able to feel rested when she does not work.  She thinks things are getting off the plate since she submitted the complaint in August to EEOC.  Although she needed to read text and letters to submit this, she does not need to do this anymore.  Although she has occasional hypervigilance when she sees a certain cars, she denies concern otherwise.  She sleeps up to 8 hours.  She denies nightmares.  She denies feeling depressed.  She has not taken clonazepam  since the last visit.  She denies SI, HI, hallucinations.  She  denies decreased need for sleep or euphoria.  She denies any alcohol use or substance use.  She feels comfortable to stay on the current medication regimen.  Of note, she had to cancel her appointments with Ms. Lucrezia due to her work schedule.  She is willing to inquire video visits if available.    Visit Diagnosis:    ICD-10-CM   1. Bipolar affective disorder, currently depressed, mild (HCC)  F31.31     2. Anxiety disorder, unspecified type  F41.9     3. Insomnia, unspecified type  G47.00     4. High risk medication use  Z79.899       Past Psychiatric History: Please see initial evaluation for full details. I have reviewed the history. No updates at this time.     Past Medical History:  Past Medical History:  Diagnosis Date   Anemia    Anxiety    Bipolar affective disorder (HCC)    Blood transfusion without reported diagnosis    Bronchospasm with bronchitis, acute    Depression    Diabetes (HCC)    diet controlled   Kidney stone    Obesity    Right carpal tunnel syndrome 12/09/2018   SBO (small bowel obstruction) (HCC) 01/2014   Sleep apnea    uses CPAP    Past Surgical History:  Procedure Laterality Date   CARPAL TUNNEL RELEASE Right 08/05/2019   Procedure: RIGHT CARPAL TUNNEL RELEASE;  Surgeon: Murrell,  Arley, MD;  Location: [facility];  Service: Orthopedics;  Laterality: Right;  IV REGIONAL FOREARM BLOCK   CESAREAN SECTION  1994   ENDOMETRIAL ABLATION     GASTRIC BYPASS  2001   KIDNEY STONE SURGERY  2010   SBO  2015    Family Psychiatric History: Please see initial evaluation for full details. I have reviewed the history. No updates at this time.     Family History:  Family History  Problem Relation Age of Onset   Diabetes Mother    Cancer Mother 11       Throat    Bipolar disorder Mother    Kidney disease Mother    Heart disease Father    Bipolar disorder Sister    Bipolar disorder Sister    Bipolar disorder Brother    Drug abuse Brother     Colon cancer Maternal Aunt    Kidney disease Maternal Grandmother    Heart disease Paternal Grandfather    Cancer - Prostate Neg Hx    Breast cancer Neg Hx    Esophageal cancer Neg Hx    Rectal cancer Neg Hx    Stomach cancer Neg Hx     Social History:  Social History   Socioeconomic History   Marital status: Married    Spouse name: Not on file   Number of children: 2   Years of education: Not on file   Highest education level: Not on file  Occupational History   Occupation: office administor  Tobacco Use   Smoking status: Former    Current packs/day: 0.25    Average packs/day: 0.3 packs/day for 6.0 years (1.5 ttl pk-yrs)    Types: Cigarettes   Smokeless tobacco: Never   Tobacco comments:    1/2 ppd   Vaping Use   Vaping status: Never Used  Substance and Sexual Activity   Alcohol use: Yes    Alcohol/week: 0.0 standard drinks of alcohol    Comment: Two times a week.    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 : Never true  Transportation Needs: No Transportation Needs (12/31/2023)   Received  from [facility]    In the past 12 months, has lack of reliable transportation kept you from medical appointments, meetings, work or from getting things needed for daily living? : No  Physical Activity: Sufficiently Active (09/10/2023)   Exercise Vital Sign  Days of Exercise per Week: 5 days    Minutes of Exercise per Session: 150+ min  Stress: Stress Concern Present (09/10/2023)   [employer] of Occupational Health - Occupational Stress Questionnaire    Feeling of Stress : Very much  Social Connections: Socially Isolated (09/10/2023)   Social Connection and Isolation Panel    Frequency of Communication with Friends and Family: Twice a week    Frequency of Social Gatherings with Friends and Family: Once a week    Attends Religious Services: Never    Database administrator or Organizations: No    Attends Banker Meetings: Never    Marital Status: Separated    Allergies:  Allergies  Allergen Reactions   Nsaids Other (See Comments)    Contraindicated with gastric bypass    Metabolic Disorder Labs: Lab Results  Component Value Date   HGBA1C 4.9 02/06/2017   MPG 105 02/26/2015   No results found for: PROLACTIN Lab Results  Component Value Date   CHOL 165 02/06/2017   TRIG 87 02/06/2017   HDL 54 02/06/2017   LDLCALC 94 02/06/2017   Lab Results  Component Value Date   TSH 1.12 07/10/2017   TSH 1.22 06/08/2016    Therapeutic Level Labs: No results found for: LITHIUM Lab Results  Component Value Date   VALPROATE 67 03/27/2022   VALPROATE 62 11/29/2020   No results found for: CBMZ  Current Medications: Current Outpatient Medications  Medication Sig Dispense Refill   acetaminophen  (TYLENOL ) 500 MG tablet Take 2 tablets (1,000 mg total) by mouth every 6 (six) hours as needed for moderate pain. 30 tablet 0   amphetamine -dextroamphetamine  (ADDERALL  XR) 25 MG 24 hr capsule Take 1 capsule (25 mg total) by mouth every morning. 30  capsule 0   amphetamine -dextroamphetamine  (ADDERALL  XR) 25 MG 24 hr capsule Take 1 capsule (25 mg total) by mouth every morning. 30 capsule 0   amphetamine -dextroamphetamine  (ADDERALL  XR) 25 MG 24 hr capsule Take 1 capsule by mouth every morning. 30 capsule 0   amphetamine -dextroamphetamine  (ADDERALL  XR) 25 MG 24 hr capsule Take 1 capsule by mouth every morning. 30 capsule 0   Armodafinil  250 MG tablet Take 1 tablet (250 mg total) by mouth daily 30 tablet 5   Armodafinil  250 MG tablet Take 1 tablet (250 mg total) by mouth daily. 30 tablet 5   Armodafinil  250 MG tablet Take 1 tablet (250 mg total) by mouth daily. 30 tablet 5   atorvastatin  (LIPITOR) 20 MG tablet Take 1 tablet (20 mg total) by mouth at bedtime. 90 tablet 1   clonazePAM  (KLONOPIN ) 0.5 MG tablet Take 1 tablet (0.5 mg total) by mouth 2 (two) times daily as needed for anxiety. 60 tablet 1   cyclobenzaprine  (FLEXERIL ) 10 MG tablet Take 1 tablet (10 mg total) by mouth 2 (two) times daily as needed for muscle spasms. 20 tablet 0   Ferrous Fumarate (HEMOCYTE - 106 MG FE) 324 (106 Fe) MG TABS tablet Take 1 tablet by mouth.     FLUoxetine  (PROZAC ) 40 MG capsule Take 1 capsule (40 mg total) by mouth daily. 30 capsule 5   [START ON 02/28/2024] lamoTRIgine  (LAMICTAL ) 150 MG tablet Take 1 tablet (150 mg total) by mouth every evening. 30 tablet 3   lamoTRIgine  (LAMICTAL ) 200 MG tablet Take 1 tablet (200 mg total) by mouth daily. 30 tablet 5   lubiprostone  (AMITIZA ) 24 MCG capsule Take 1 capsule (24 mcg total) by mouth 2 (two) times daily. 120 capsule  3   lurasidone  (LATUDA ) 80 MG TABS tablet Take 1 tablet (80 mg total) by mouth daily with breakfast. 30 tablet 3   omeprazole  (PRILOSEC) 20 MG capsule Take 1 capsule (20 mg total) by mouth 2 (two) times daily before a meal. Best to take on an empty stomach. 90 capsule 1   omeprazole  (PRILOSEC) 20 MG capsule Take 1 capsule (20 mg total) by mouth 2 (two) times daily as needed. 180 capsule 1    Semaglutide -Weight Management (WEGOVY ) 2.4 MG/0.75ML SOAJ Inject 2.4 mg into the skin once a week. 3 mL 0   traZODone  (DESYREL ) 50 MG tablet Take 0.5-1 tablets (25-50 mg total) by mouth at bedtime as needed for sleep. 30 tablet 1   valACYclovir  (VALTREX ) 500 MG tablet Take 1 tablet (500 mg total) by mouth 2 (two) times daily for 3 days as needed for vulvar lesions. 60 tablet 5   No current facility-administered medications for this visit.     Musculoskeletal: Strength & Muscle Tone: N/A Gait & Station: N/A Patient leans: N/A  Psychiatric Specialty Exam: Review of Systems  Psychiatric/Behavioral:  Negative for agitation, behavioral problems, confusion, decreased concentration, dysphoric mood, hallucinations, self-injury, sleep disturbance and suicidal ideas. The patient is not nervous/anxious and is not hyperactive.   All other systems reviewed and are negative.   There were no vitals taken for this visit.There is no height or weight on file to calculate BMI.  General Appearance: Well Groomed  Eye Contact:  Good  Speech:  Clear and Coherent  Volume:  Normal  Mood:  good  Affect:  Appropriate, Congruent, and Full Range  Thought Process:  Coherent  Orientation:  Full (Time, Place, and Person)  Thought Content: Logical   Suicidal Thoughts:  No  Homicidal Thoughts:  No  Memory:  Immediate;   Good  Judgement:  Good  Insight:  Good  Psychomotor Activity:  Normal  Concentration:  Concentration: Good and Attention Span: Good  Recall:  Good  Fund of Knowledge: Good  Language: Good  Akathisia:  No  Handed:  Right  AIMS (if indicated): not done  Assets:  Communication Skills  ADL's:  Intact  Cognition: WNL  Sleep:  Good   Screenings: GAD-7    Flowsheet Row Counselor from 09/10/2023 in [facility] Office Visit from 01/01/2023 in [facility]  Total GAD-7 Score 18 19   PHQ2-9    Flowsheet Row  Counselor from 09/10/2023 in [facility] Office Visit from 01/01/2023 in [facility] Office Visit from 11/08/2021 in [facility] Video Visit from 06/03/2021 in [facility] Video Visit from 02/25/2021 in [facility]  PHQ-2 Total Score 4 6 4  0 0  PHQ-9 Total Score 12 23 15  -- --   Flowsheet Row Counselor from 09/10/2023 in [facility] ED from 01/15/2023 in [facility] Office Visit from 01/01/2023 in [facility]  C-SSRS RISK CATEGORY Low Risk No Risk Error: Q3, 4, or 5 should not be populated when Q2 is No     Assessment and Plan:  Ashley Franklin is a 53 y.o. year old female with a history of bipolar disorder, anxiety, anemia, obesity s/p gastric bypass surgery, who presents for follow up appointment for below.   1. Bipolar affective disorder, currently depressed, mild (HCC)  2. Anxiety disorder, unspecified type # r/o PTSD She has a family history of her brother with substance use, and her son with alcohol use. She is separated from her husband.  History:Tx from Dr. Arfeen. History of  impulsive sexually inappropriate behavior, irritability, anger, grandiosity when her husband left in 2010 Admitted in 2015 for depression. Originally on stelazine , trileptal  300 mg TID, fluoxetine  60 mg, amitriptyline  25 mg TID, valium  5 mg TID, Ambien  10 mg qhs     The exam is notable for bright affect, and she reports consistent improvement in depressive symptoms and anxiety.  She enjoys the work, and has started school, which she would and in December.  She is currently awaiting a response from her husband, which will help her decide whether to relocate to [place]  to be closer to her family.   Will continue current medication regimen.  Will continue lamotrigine  and Latuda  for bipolar depression.  Will continue fluoxetine  for anxiety, and bipolar depression, off label.  Will continue clonazepam  as needed for anxiety.   3. Insomnia, unspecified type Significantly improved.  Will continue current dose of trazodone  as needed for insomnia.   4. High risk medication use - UDS cannabinoid positive, 10/2023    # iron deifiency  - ferritin 35  12/2023 Overall improvement.  Will follow up on her ferritin level at the next visit.        Last checked  EKG HR 85, QTc454 msec 11/2022  Lipid panels LDL 83 12/2023  HbA1c 4.7 12/2023   Vitami D 22.7 L 12/2023    Plan:  Continue lamotrigine  200 mg daily, 150 mg at night - uptitrated 07/13/2023 Continue Latuda  80 mg daily - akathisia from 100 mg  Continue fluoxetine  40 mg daily  Continue  L-methyl folate 15 mg daily  Continue clonazepam  0.5 mg twice as needed for anxiety - she rarely uses this Continue trazodone  50 mg at night as needed for sleep Next appointment: 11/7 at 9 30, video - on Adderall  XR 25 mg daily, armodafinil  for narcolepsy - on wegovy     Past trials of medication: Lexapro, Paxil, amitriptyline , Abilify  (irritable), oxcarbazepine , perphenazine, Trifluoperazine , Klonopin , Valium . Vistaril , Ambien     The patient demonstrates the following  risk factors for suicide: Chronic risk factors for suicide include psychiatric disorder /bipolar disorder, previous self-harm of scratching herself. Acute risk factors for suicide include none. Protective factors for this patient include positive social support, positive therapeutic relationship, hope for the future. Considering these factors, the overall suicide risk at this point appears to be moderate, but not imminent. Patient denies gun access. Discussed in detail safety plan that anytime having active suicidal thoughts or homicidal thoughts and she need to call 911 or go to local emergency  room  Collaboration of Care: Collaboration of Care: Other reviewed notes in Epic  Patient/Guardian was advised Release of Information must be obtained prior to any record release in order to collaborate their care with an outside provider. Patient/Guardian was advised if they have not already done so to contact the registration department to sign all necessary forms in order for us  to release information regarding their care.   Consent: Patient/Guardian gives verbal consent for treatment and assignment of benefits for services provided during this visit. Patient/Guardian expressed understanding and agreed to proceed.    Katheren Sleet, MD 02/01/2024, 10:01 AM

## 2024-01-28 ENCOUNTER — Other Ambulatory Visit (HOSPITAL_BASED_OUTPATIENT_CLINIC_OR_DEPARTMENT_OTHER): Payer: Self-pay

## 2024-01-30 ENCOUNTER — Ambulatory Visit: Admitting: Professional Counselor

## 2024-02-01 ENCOUNTER — Telehealth: Admitting: Psychiatry

## 2024-02-01 ENCOUNTER — Other Ambulatory Visit (HOSPITAL_BASED_OUTPATIENT_CLINIC_OR_DEPARTMENT_OTHER): Payer: Self-pay

## 2024-02-01 ENCOUNTER — Encounter: Payer: Self-pay | Admitting: Psychiatry

## 2024-02-01 ENCOUNTER — Other Ambulatory Visit: Payer: Self-pay

## 2024-02-01 DIAGNOSIS — F419 Anxiety disorder, unspecified: Secondary | ICD-10-CM

## 2024-02-01 DIAGNOSIS — G4700 Insomnia, unspecified: Secondary | ICD-10-CM | POA: Diagnosis not present

## 2024-02-01 DIAGNOSIS — F3131 Bipolar disorder, current episode depressed, mild: Secondary | ICD-10-CM | POA: Diagnosis not present

## 2024-02-01 DIAGNOSIS — Z79899 Other long term (current) drug therapy: Secondary | ICD-10-CM

## 2024-02-01 MED ORDER — CLONAZEPAM 0.5 MG PO TABS
0.5000 mg | ORAL_TABLET | Freq: Two times a day (BID) | ORAL | 1 refills | Status: AC | PRN
  Filled 2024-02-01: qty 60, 30d supply, fill #0
  Filled 2024-03-17: qty 60, 30d supply, fill #1

## 2024-02-01 MED ORDER — TRAZODONE HCL 50 MG PO TABS
25.0000 mg | ORAL_TABLET | Freq: Every evening | ORAL | 1 refills | Status: DC | PRN
  Filled 2024-02-01: qty 30, 30d supply, fill #0
  Filled 2024-03-17: qty 30, 30d supply, fill #1

## 2024-02-01 MED ORDER — LAMOTRIGINE 150 MG PO TABS
150.0000 mg | ORAL_TABLET | Freq: Every evening | ORAL | 3 refills | Status: AC
  Filled 2024-02-28: qty 30, 30d supply, fill #0

## 2024-02-01 NOTE — Patient Instructions (Signed)
 Continue lamotrigine  200 mg daily, 150 mg at night  Continue Latuda  80 mg daily  Continue fluoxetine  40 mg daily  Continue  L-methyl folate 15 mg daily  Continue clonazepam  0.5 mg twice as needed for anxiety  Continue trazodone  50 mg at night as needed for sleep Next appointment: 11/7 at 9 30

## 2024-02-05 ENCOUNTER — Other Ambulatory Visit (HOSPITAL_BASED_OUTPATIENT_CLINIC_OR_DEPARTMENT_OTHER): Payer: Self-pay

## 2024-02-08 NOTE — Progress Notes (Signed)
 GENERAL SURGERY RETURN VISIT  Patient: Ashley Franklin MRN: [id number] Date: 02/12/2024  DIAGNOSIS: Hiatal hernia  HISTORY OF PRESENT ILLNESS:  Ms. Barcelo is a 53 year old female with a history of OSA and morbid obesity s/p gastric bypass who returns to clinic today regarding a hiatal hernia.  She previously underwent a laparoscopic gastric bypass about 20 years ago.  Her work up is noted below.  She was last seen in clinic 3 weeks ago and we recommended updating her CT scan which is noted below.  She continues to notice symptoms as noted previously.  CT A/P (10/14/21): Gastric pouch was herniated into her chest (I have personally reviewed these images)  RUQ US  (10/21/21): Unremarkable  HIDA (10/26/21): EF of 74%  Esophageal Manometry (02/22/22): Normal  UGI (03/09/22): RNY gastric bypass with small to moderate hiatal hernia  CT A/P (02/06/24):  Hiatal hernia containing the gastric pouch (I have personally reviewed these images)  MEDS: Current Rx ordered in [facility][1]  PROBLEM LIST: Problem List[2]   ALLERGIES: Allergies[3]   PSH: Surgical History[4]  PMH: Medical History[5]   SOCIAL HISTORY: Social History   Socioeconomic History  . Marital status: Married    Spouse name: Not on file  . Number of children: Not on file  . Years of education: Not on file  . Highest education level: Not on file  Occupational History  . Not on file  Tobacco Use  . Smoking status: Former    Current packs/day: 0.00    Average packs/day: 1 pack/day for 21.0 years (21.0 ttl pk-yrs)    Types: Cigarettes    Start date: 05/15/1997    Quit date: 2020    Years since quitting: 5.7  . Smokeless tobacco: Never  . Tobacco comments:    Per 2024 LS form/chart (1) quit at age 53  Substance and Sexual Activity  . Alcohol use: Yes    Alcohol/week: 2.0 standard drinks of alcohol    Types: 2 Glasses of wine per week  . Drug use: Not Currently  . Sexual activity: Yes    Partners: Male    Birth  control/protection: Surgical  Other Topics Concern  . Not on file  Social History Narrative  . Not on file   Social Drivers of Health   Food Insecurity: Low Risk  (12/31/2023)   Food vital sign   . Within the past 12 months, you worried that your food would run out before you got money to buy more: Never true   . Within the past 12 months, the food you bought just didn't last and you didn't have money to get more: Never true  Transportation Needs: No Transportation Needs (12/31/2023)   Transportation   . In the past 12 months, has lack of reliable transportation kept you from medical appointments, meetings, work or from getting things needed for daily living? : No  Safety: Low Risk  (12/31/2023)   Safety   . How often does anyone, including family and friends, physically hurt you?: Never   . How often does anyone, including family and friends, insult or talk down to you?: Never   . How often does anyone, including family and friends, threaten you with harm?: Never   . How often does anyone, including family and friends, scream or curse at you?: Never  Living Situation: Low Risk  (12/31/2023)   Living Situation   . What is your living situation today?: I have a steady place to live   . Think about  the place you live. Do you have problems with any of the following? Choose all that apply:: None/None on this list    FAMILY HISTORY: Family History[6]  ROS: A complete point review of systems was negative except for as stated in the HPI.  PHYSICAL EXAMINATION: VITAL SIGNS: BP 110/71 (BP Location: Left arm, Patient Position: Sitting)   Pulse 82   Temp 97.5 F (36.4 C) (Temporal)   Ht 1.664 m (5' 5.5)   Wt 72.1 kg (159 lb)   SpO2 98%   BMI 26.06 kg/m   Gen: pt sitting in chair in NAD HEENT: atraumatic, EOMI, pupils equal and round CV: regular rate and rhythm Pulm: normal work of breathing on room air Abd: soft, mild tenderness to palpation in the epigastric region, non-distended,  no rebound tenderness or guarding Ext: warm, well perfused, moves all extremities Neuro: GCS 15, no gross deficits Skin: no rashes, no lesions Psych: normal judgement, normal mood  IMAGING:  UGI (03/09/22): Postsurgical changes consistent with previous gastric Roux-en-Y bypass. Small to moderate hiatal hernia with most of the gastric pouch above the diaphragm. No evidence of esophageal stricture, gastric outlet obstruction or ulceration. Mild gastroesophageal reflux.  RUQ US  (10/21/21): 1. Unremarkable right upper quadrant ultrasound. No evidence of cholelithiasis or cholecystitis.   HIDA (10/26/21): Normal examination.   Esophageal manometry (02/22/22): Normal  CT A/P (02/06/24): 1.  Redemonstrated hiatal hernia with likely gastric pouch within the thoracic cavity. 2.  Mild periumbilical skin thickening and soft tissue strandings, correlate clinically for focal cellulitis.   I have personally reviewed the imaging report.  LABS:  12/31/23 Hb: 13.3 Albumin: 4.3 Hb A1C: 4.7 Iron panel: Normal Vitamin D: 22.7 Vitamin B12: >1500 Folate: 959.4  The above labs are used to assess a patient's nutritional status as well as vitamin and mineral status after undergoing bariatric surgery.   ASSESSMENT/PLAN:  53 y.o. female with a hiatal hernia containing the proximal pouch.  She continues to have symptoms of dysphagia.  We discussed that this is a high risk surgery given that it is a redo foregut surgery and there is a small chance this does not help her symptoms.  She understands this and would like to proceed forward with surgery.  - OR for laparoscopic hiatal hernia repair on 03/31/24, informed consent obtained in clinic - PAC visit to be arranged   It was discussed with the patient that these facilities include a teaching institution and that residents and fellows who are physicians in graduate medical training, non-physician practitioners including physician assistants and nurse  practitioners, and students in training to be physicians may assist in their care, which includes performing portions of the operation under my supervision.  I will be present for all critical portions of the operation.    Electronically signed by: Prentice Tanda Pizza, MD 02/12/2024 11:00 AM         [1] Meds Ordered in [facility]  Medication Sig Dispense Refill  . acetaminophen  (TYLENOL ) 500 mg tablet Take 1,000 mg by mouth.    . atorvastatin  (LIPITOR) 20 mg tablet Take 1 tablet (20 mg total) by mouth every other day.    . dextroamphetamine -amphetamine  (Adderall  XR) 25 mg 24 hr capsule Take 1 capsule (25 mg total) by mouth every morning. 30 capsule 0  . dextroamphetamine -amphetamine  (Adderall  XR) 25 mg 24 hr capsule Take 1 capsule (25 mg total) by mouth every morning. 30 capsule 0  . dextroamphetamine -amphetamine  (ADDERALL  XR) 25 mg 24 hr capsule Take 1 capsule by mouth in  the morning. 30 capsule 0  . dextroamphetamine -amphetamine  (ADDERALL  XR) 25 mg 24 hr capsule Take 1 capsule by mouth in the morning. 30 capsule 0  . ferrous fumarate (FERROCITE, HEMOCYTE) 324 mg (106 mg iron) tab Take 1 tablet by mouth.    . FLUoxetine  (PROzac ) 40 mg capsule Take 40 mg by mouth daily.    . lamoTRIgine  (LaMICtal ) 150 mg tablet Take 150 mg by mouth.    . lamoTRIgine  (LaMICtal ) 200 mg tablet Take 200 mg by mouth every morning.    . lurasidone  (LATUDA ) 80 mg tab Take 80 mg by mouth daily with breakfast.    . omeprazole  (PriLOSEC) 20 mg DR capsule Take 1 capsule (20 mg total) by mouth 2 (two) times a day. As needed 180 capsule 1  . semaglutide , weight loss, (Wegovy ) 2.4 mg/0.75 mL subcutaneous pen injector Inject 2.4 mg into the skin once a week. 3 mL 0  . valACYclovir  (Valtrex ) 500 mg tablet One po twice daily for 3 days as needed vulvar lesions 60 tablet 5   No current Epic-ordered facility-administered medications on file.  [2] Patient Active Problem List Diagnosis  . Intention tremor  . Word finding  difficulty  . Dyslexia  . Memory disturbance  . Blurry vision  . Allodynia  . Muscle spasm  . Altered awareness, transient  . Carpal tunnel syndrome of right wrist  . Numbness  . OSA on CPAP  . Primary narcolepsy without cataplexy  . Nausea & vomiting  . Hiatal hernia  . Severe episode of recurrent major depressive disorder, without psychotic features    (CMD)  . Lipid screening  . Other long term (current) drug therapy  . Encounter for health maintenance examination with abnormal findings  . Vitamin D deficiency  . Vitamin B12 deficiency  . Mild episode of recurrent major depressive disorder  . Breast cancer screening by mammogram  . Pap smear for cervical cancer screening  . Screening for lung cancer  . Right hip pain  . Right sided sciatica  . Encounter for immunization  . Iron deficiency anemia secondary to inadequate dietary iron intake  . Obesity due to excess calories with serious comorbidity  . Mixed hyperlipidemia  . Diabetes mellitus screening  . History of diabetes mellitus  [3] Allergies Allergen Reactions  . Nsaids (Non-Steroidal Anti-Inflammatory Drug)     She has had gastric bypass surgery  [4] Past Surgical History: Procedure Laterality Date  . BARIATRIC SURGERY  2000  . CARPAL TUNNEL RELEASE Right 08/05/2019   Procedure: CARPAL TUNNEL RELEASE  . CESAREAN SECTION, CLASSICAL  1994  . CESAREAN SECTION, UNSPECIFIED     Procedure: CESAREAN SECTION  . ELBOW SURGERY Left    Procedure: ELBOW SURGERY  . ENDOMETRIAL ABLATION  2013   Procedure: ENDOMETRIAL ABLATION  . EYE SURGERY     Procedure: EYE SURGERY  . GASTRIC BYPASS     Procedure: GASTRIC BYPASS  . TUBAL LIGATION     Procedure: TUBAL LIGATION  . WISDOM TOOTH EXTRACTION     Procedure: WISDOM TOOTH EXTRACTION  [5] Past Medical History: Diagnosis Date  . Abnormal Pap smear of cervix August  . Anemia   . Anxiety   . Class 3 severe obesity due to excess calories with serious comorbidity and body  mass index (BMI) of 40.0 to 44.9 in adult 11/06/2022  . Depression   . Gestational diabetes (CMD) 1994  . History of transfusion 2013  . 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 Hypertension associated with diabetes    (  CMD) 05/01/2023  . Kidney stone 2010  . Type 2 diabetes mellitus without complication, without long-term current use of insulin     (CMD) 11/06/2022  [6] Family History Problem Relation Name Age of Onset  . Heart disease Father    . Cancer Mother Clarita   . Diabetes Mother Clarita   . Hypertension Mother Clarita   . Breast cancer Neg Hx    . Colon cancer Neg Hx

## 2024-02-18 ENCOUNTER — Other Ambulatory Visit (HOSPITAL_BASED_OUTPATIENT_CLINIC_OR_DEPARTMENT_OTHER): Payer: Self-pay

## 2024-02-19 ENCOUNTER — Other Ambulatory Visit (HOSPITAL_BASED_OUTPATIENT_CLINIC_OR_DEPARTMENT_OTHER): Payer: Self-pay

## 2024-02-21 ENCOUNTER — Other Ambulatory Visit (HOSPITAL_BASED_OUTPATIENT_CLINIC_OR_DEPARTMENT_OTHER): Payer: Self-pay

## 2024-02-28 ENCOUNTER — Other Ambulatory Visit (HOSPITAL_BASED_OUTPATIENT_CLINIC_OR_DEPARTMENT_OTHER): Payer: Self-pay

## 2024-03-10 ENCOUNTER — Other Ambulatory Visit (HOSPITAL_BASED_OUTPATIENT_CLINIC_OR_DEPARTMENT_OTHER): Payer: Self-pay

## 2024-03-17 ENCOUNTER — Other Ambulatory Visit (HOSPITAL_BASED_OUTPATIENT_CLINIC_OR_DEPARTMENT_OTHER): Payer: Self-pay

## 2024-03-17 MED ORDER — GABAPENTIN 100 MG PO CAPS
300.0000 mg | ORAL_CAPSULE | Freq: Every day | ORAL | 0 refills | Status: AC
  Filled 2024-03-17: qty 90, 30d supply, fill #0

## 2024-03-17 MED ORDER — LIDOCAINE 5 % EX PTCH
1.0000 | MEDICATED_PATCH | Freq: Every day | CUTANEOUS | 0 refills | Status: AC
  Filled 2024-03-17 – 2024-04-01 (×3): qty 30, 30d supply, fill #0

## 2024-03-17 NOTE — Progress Notes (Signed)
 [facility]  - INTERNAL MEDICINE [facility] Office Visit [place] DOB: [birth year]  MRN: [id number]  Visit Date: 03/17/2024 Encounter Provider: Tari Maurilio Cary, MD  PCP: Tari Maurilio Cary, MD  Chief Complaint  Patient presents with  . gluteal pain    Patient c/o right gluteal pain that radiates into back of right leg around into right thigh x 10 days Patient is having difficulty lifting leg, bending to sit down, walking Numbness/ tingling in right foot Patient has tried ice, [employer], lidocaine  patches, aleve without relief   Subjective:   HPI:  Ashley Franklin is a 53 y.o. female who presents for above and ongoing care of problems listed below.  Issues today include: See chief complaint.  Assessment/Plan:   1. Acute pain of right hip (Primary) Etiology unclear - XR Spine Lumbar 2-3 Views; Future - XR Hip Uni W Or W/O Pelvis 2-3 Vw Rt; Future - Ambulatory referral to Orthopedic Surgery; Future - gabapentin  (NEURONTIN ) 100 mg capsule; Please take 1 to 3 capsules before bedtime  Dispense: 90 capsule; Refill: 0 - lidocaine  (LIDODERM ) 5 % patch; Apply 1 patch topically daily. Remove & discard patch within 12 hours or as directed by MD.  Dispense: 30 patch; Refill: 0  2. Radiculopathy, unspecified spinal region See #1 - XR Spine Lumbar 2-3 Views; Future - XR Hip Uni W Or W/O Pelvis 2-3 Vw Rt; Future - Ambulatory referral to Orthopedic Surgery; Future - gabapentin  (NEURONTIN ) 100 mg capsule; Please take 1 to 3 capsules before bedtime  Dispense: 90 capsule; Refill: 0 - lidocaine  (LIDODERM ) 5 % patch; Apply 1 patch topically daily. Remove & discard patch within 12 hours or as directed by MD.  Dispense: 30 patch; Refill: 0  3. Gait difficulty See #1 - XR Spine Lumbar 2-3 Views; Future - XR Hip Uni W Or W/O Pelvis 2-3 Vw Rt; Future - Ambulatory referral to Orthopedic Surgery; Future  4. Hiatal hernia Patient is having surgery  next week.  Follow-up-as scheduled on 04/02/2024-weight management.  Has been on Wegovy  2.4 mg q. weekly   This note was dictated with voice recognition software. Similar sounding words may be inadvertently transcribed incorrectly.   Orders:  Orders Placed This Encounter  Procedures  . XR Spine Lumbar 2-3 Views  . XR Hip Uni W Or W/O Pelvis 2-3 Vw Rt  . Ambulatory referral to Orthopedic Surgery   Medications:  Orders Placed This Encounter  Medications  . gabapentin  (NEURONTIN ) 100 mg capsule    Sig: Please take 1 to 3 capsules before bedtime    Dispense:  90 capsule    Refill:  0  . lidocaine  (LIDODERM ) 5 % patch    Sig: Apply 1 patch topically daily. Remove & discard patch within 12 hours or as directed by MD.    Dispense:  30 patch    Refill:  0   Objective:  BP 108/67 (BP Location: Right arm, Patient Position: Sitting)   Pulse 80   Temp 98.6 F (37 C) (Oral)   Resp 12   Wt 72.5 kg (159 lb 12.8 oz)   SpO2 99%   BMI 26.19 kg/m   Physical Exam  General: No acute distress, well-nourished Psychiatric: Alert and oriented x 3, mood and affect appropriate, behavior appropriate Musculoskeletal-LS-spine area nontender to palpation.  Right hip and buttock significantly tender to palpation.  Patient having difficulty with range of motion  Patient Active Problem List   Diagnosis Date Noted   . Mixed hyperlipidemia  12/31/2023  . Diabetes mellitus screening 12/31/2023  . History of diabetes mellitus 12/31/2023  . Obesity due to excess calories with serious comorbidity 11/29/2023  . Iron deficiency anemia secondary to inadequate dietary iron intake 05/01/2023  . Right hip pain 04/16/2023  . Right sided sciatica 04/16/2023  . Encounter for immunization 04/16/2023  . Encounter for health maintenance examination with abnormal findings 12/12/2022  . Vitamin D deficiency 12/12/2022  . Vitamin B12 deficiency 12/12/2022  . Mild episode of recurrent major depressive disorder  12/12/2022  . Breast cancer screening by mammogram 12/12/2022  . Pap smear for cervical cancer screening 12/12/2022  . Screening for lung cancer 12/12/2022  . Severe episode of recurrent major depressive disorder, without psychotic features    (CMD) 11/06/2022  . Lipid screening 11/06/2022  . Other long term (current) drug therapy 11/06/2022  . Hiatal hernia 10/18/2021  . Nausea & vomiting 10/14/2021  . OSA on CPAP 12/10/2019  . Primary narcolepsy without cataplexy 12/10/2019  . Carpal tunnel syndrome of right wrist 01/13/2019  . Numbness 01/13/2019  . Intention tremor 07/05/2016  . Word finding difficulty 07/05/2016  . Dyslexia 07/05/2016  . Memory disturbance 07/05/2016  . Blurry vision 07/05/2016  . Allodynia 07/05/2016  . Muscle spasm 07/05/2016  . Altered awareness, transient 07/05/2016    Resolved Problems   Diagnosis Date Noted Date Resolved  . Class 1 obesity due to excess calories with serious comorbidity and body mass index (BMI) of 33.0 to 33.9 in adult 08/28/2023 11/29/2023  . Class 2 severe obesity due to excess calories with serious comorbidity and body mass index (BMI) of 37.0 to 37.9 in adult 05/30/2023 11/29/2023  . Class 2 severe obesity due to excess calories with serious comorbidity and body mass index (BMI) of 39.0 to 39.9 in adult 05/01/2023 05/30/2023  . Hypertension associated with diabetes    (CMD) 05/01/2023 11/29/2023  . Hyperlipidemia associated with type 2 diabetes mellitus    (CMD) 12/12/2022 11/29/2023  . Type 2 diabetes mellitus without complication, without long-term current use of insulin     (CMD) 12/12/2022 11/29/2023  . Class 3 severe obesity due to excess calories with serious comorbidity and body mass index (BMI) of 40.0 to 44.9 in adult 11/06/2022 05/30/2023  . Type 2 diabetes mellitus without complication, without long-term current use of insulin     (CMD) 11/06/2022 12/12/2022  . Anemia 11/06/2022 05/01/2023    Medical History[1]  Current  Outpatient Medications  Medication Instructions  . acetaminophen  (TYLENOL ) 1,000 mg  . atorvastatin  (LIPITOR) 20 mg, oral, Every 48 hours  . clonazePAM  (KLONOPIN ) 0.5 mg  . ferrous fumarate (FERROCITE, HEMOCYTE) 324 mg (106 mg iron) tab 1 tablet  . FLUoxetine  (PROZAC ) 40 mg, Daily  . gabapentin  (NEURONTIN ) 100 mg capsule Please take 1 to 3 capsules before bedtime  . lamoTRIgine  (LAMICTAL ) 200 mg, Every morning  . lamoTRIgine  (LAMICTAL ) 150 mg  . lidocaine  (LIDODERM ) 5 % patch 1 patch, topical, Daily, Remove & discard patch within 12 hours or as directed by MD.  . lurasidone  (LATUDA ) 80 mg, Daily with breakfast  . omeprazole  (PRILOSEC) 20 mg, oral, 2 times daily, As needed  . traZODone  (DESYREL ) 25-50 mg  . valACYclovir  (Valtrex ) 500 mg tablet One po twice daily for 3 days as needed vulvar lesions    Allergies[2]  ROS: See HPI otherwise review of systems negative  Labs: Lab Results  Component Value Date   WBC 4.70 12/31/2023   HGB 13.3 12/31/2023   HCT 39.2 12/31/2023   PLT  257 12/31/2023   CHOL 155 12/31/2023   TRIG 48 12/31/2023   HDL 58 (L) 12/31/2023   ALT 12 12/31/2023   AST 20 12/31/2023   NA 140 12/31/2023   K 4.0 12/31/2023   CL 104 12/31/2023   CREATININE 0.67 12/31/2023   BUN 11 12/31/2023   CO2 30 12/31/2023   TSH 0.662 12/31/2023   HGBA1C 4.7 12/31/2023   Lab Results  Component Value Date   LDLCALC 83 12/31/2023   LDLCALC 86 04/16/2023    Electronically signed by: Tari Maurilio Cary, MD 03/17/2024 2:39 PM        [1] Past Medical History: Diagnosis Date  . Abnormal Pap smear of cervix August  . Anemia   . Anxiety   . Class 3 severe obesity due to excess calories with serious comorbidity and body mass index (BMI) of 40.0 to 44.9 in adult 11/06/2022  . Depression   . Gestational diabetes (CMD) 1994  . History of transfusion 2013  . 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 Hypertension associated with diabetes    (CMD) 05/01/2023  . Kidney stone  2010  . Type 2 diabetes mellitus without complication, without long-term current use of insulin     (CMD) 11/06/2022  [2] Allergies Allergen Reactions  . Nsaids (Non-Steroidal Anti-Inflammatory Drug)     She has had gastric bypass surgery

## 2024-03-17 NOTE — Progress Notes (Signed)
 Sent a video visit link through Epic, but the patient didn't sign in. Tried calling for today's appointment, but got no answer. Left a voicemail instructing the patient to contact the office at [phone].

## 2024-03-18 ENCOUNTER — Other Ambulatory Visit (HOSPITAL_BASED_OUTPATIENT_CLINIC_OR_DEPARTMENT_OTHER): Payer: Self-pay

## 2024-03-18 MED ORDER — AMPHETAMINE-DEXTROAMPHET ER 25 MG PO CP24
25.0000 mg | ORAL_CAPSULE | Freq: Every morning | ORAL | 0 refills | Status: DC
  Filled 2024-03-18: qty 30, 30d supply, fill #0

## 2024-03-19 ENCOUNTER — Other Ambulatory Visit (HOSPITAL_BASED_OUTPATIENT_CLINIC_OR_DEPARTMENT_OTHER): Payer: Self-pay

## 2024-03-21 ENCOUNTER — Telehealth (INDEPENDENT_AMBULATORY_CARE_PROVIDER_SITE_OTHER): Admitting: Psychiatry

## 2024-03-21 ENCOUNTER — Other Ambulatory Visit (HOSPITAL_BASED_OUTPATIENT_CLINIC_OR_DEPARTMENT_OTHER): Payer: Self-pay

## 2024-03-21 DIAGNOSIS — Z91199 Patient's noncompliance with other medical treatment and regimen due to unspecified reason: Secondary | ICD-10-CM

## 2024-03-31 ENCOUNTER — Other Ambulatory Visit (HOSPITAL_BASED_OUTPATIENT_CLINIC_OR_DEPARTMENT_OTHER): Payer: Self-pay

## 2024-04-01 ENCOUNTER — Other Ambulatory Visit (HOSPITAL_BASED_OUTPATIENT_CLINIC_OR_DEPARTMENT_OTHER): Payer: Self-pay

## 2024-04-01 MED ORDER — OXYCODONE HCL 5 MG PO TABS
5.0000 mg | ORAL_TABLET | ORAL | 0 refills | Status: AC | PRN
  Filled 2024-04-01: qty 20, 4d supply, fill #0

## 2024-04-01 MED ORDER — ONDANSETRON 4 MG PO TBDP
4.0000 mg | ORAL_TABLET | Freq: Three times a day (TID) | ORAL | 0 refills | Status: AC | PRN
Start: 2024-04-01 — End: ?
  Filled 2024-04-01: qty 20, 7d supply, fill #0

## 2024-04-09 ENCOUNTER — Other Ambulatory Visit (HOSPITAL_BASED_OUTPATIENT_CLINIC_OR_DEPARTMENT_OTHER): Payer: Self-pay

## 2024-04-09 ENCOUNTER — Other Ambulatory Visit: Payer: Self-pay | Admitting: Psychiatry

## 2024-04-09 MED ORDER — TRAZODONE HCL 50 MG PO TABS: 25.0000 mg | ORAL_TABLET | Freq: Every evening | ORAL | 0 refills | Status: AC | PRN

## 2024-05-02 ENCOUNTER — Telehealth: Payer: Self-pay | Admitting: Psychiatry

## 2024-05-02 NOTE — Telephone Encounter (Signed)
 [facility] has been contacted several times by phone and MyChart to reschedule missed appointments. Another MyChart message was sent to call our office if she wishes to continue her care and obtain refills.

## 2024-06-09 ENCOUNTER — Other Ambulatory Visit (HOSPITAL_BASED_OUTPATIENT_CLINIC_OR_DEPARTMENT_OTHER): Payer: Self-pay
